# Patient Record
Sex: Female | Born: 1935 | Race: White | Hispanic: No | Smoking: Former smoker
Health system: Southern US, Community
[De-identification: ages and names within clinical notes are randomized; demographics above are authoritative.]

## PROBLEM LIST (undated history)

## (undated) DIAGNOSIS — E785 Hyperlipidemia, unspecified: Secondary | ICD-10-CM

## (undated) DIAGNOSIS — I10 Essential (primary) hypertension: Secondary | ICD-10-CM

## (undated) DIAGNOSIS — F418 Other specified anxiety disorders: Secondary | ICD-10-CM

## (undated) DIAGNOSIS — K589 Irritable bowel syndrome without diarrhea: Secondary | ICD-10-CM

## (undated) DIAGNOSIS — I4891 Unspecified atrial fibrillation: Secondary | ICD-10-CM

## (undated) DIAGNOSIS — I4892 Unspecified atrial flutter: Secondary | ICD-10-CM

## (undated) DIAGNOSIS — K5790 Diverticulosis of intestine, part unspecified, without perforation or abscess without bleeding: Secondary | ICD-10-CM

## (undated) DIAGNOSIS — R413 Other amnesia: Secondary | ICD-10-CM

## (undated) DIAGNOSIS — M549 Dorsalgia, unspecified: Secondary | ICD-10-CM

## (undated) DIAGNOSIS — K219 Gastro-esophageal reflux disease without esophagitis: Secondary | ICD-10-CM

## (undated) DIAGNOSIS — M797 Fibromyalgia: Secondary | ICD-10-CM

## (undated) DIAGNOSIS — M21371 Foot drop, right foot: Secondary | ICD-10-CM

## (undated) DIAGNOSIS — B351 Tinea unguium: Secondary | ICD-10-CM

## (undated) DIAGNOSIS — K633 Ulcer of intestine: Secondary | ICD-10-CM

## (undated) DIAGNOSIS — K649 Unspecified hemorrhoids: Secondary | ICD-10-CM

## (undated) DIAGNOSIS — G8929 Other chronic pain: Secondary | ICD-10-CM

## (undated) DIAGNOSIS — R0989 Other specified symptoms and signs involving the circulatory and respiratory systems: Secondary | ICD-10-CM

## (undated) HISTORY — PX: CERVICAL FUSION: SHX112

## (undated) HISTORY — DX: Gastro-esophageal reflux disease without esophagitis: K21.9

## (undated) HISTORY — DX: Other amnesia: R41.3

## (undated) HISTORY — DX: Other specified anxiety disorders: F41.8

## (undated) HISTORY — DX: Ulcer of intestine: K63.3

## (undated) HISTORY — PX: COLONOSCOPY: SHX174

## (undated) HISTORY — DX: Fibromyalgia: M79.7

## (undated) HISTORY — DX: Tinea unguium: B35.1

## (undated) HISTORY — DX: Hyperlipidemia, unspecified: E78.5

## (undated) HISTORY — DX: Other specified symptoms and signs involving the circulatory and respiratory systems: R09.89

---
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
## 1958-09-22 HISTORY — PX: LUMBAR LAMINECTOMY: SHX95

## 1968-09-22 HISTORY — PX: OTHER SURGICAL HISTORY: SHX169

## 1979-09-23 HISTORY — PX: ABDOMINAL HYSTERECTOMY: SHX81

## 1989-05-23 HISTORY — PX: WRIST SURGERY: SHX841

## 1995-09-23 HISTORY — PX: BACK SURGERY: SHX140

## 1999-04-23 ENCOUNTER — Ambulatory Visit (HOSPITAL_COMMUNITY): Admission: RE | Admit: 1999-04-23 | Discharge: 1999-04-23 | Payer: Self-pay

## 1999-05-21 ENCOUNTER — Other Ambulatory Visit: Admission: RE | Admit: 1999-05-21 | Discharge: 1999-05-21 | Payer: Self-pay | Admitting: Family Medicine

## 2001-01-14 ENCOUNTER — Other Ambulatory Visit: Admission: RE | Admit: 2001-01-14 | Discharge: 2001-01-14 | Payer: Self-pay | Admitting: Family Medicine

## 2002-05-14 ENCOUNTER — Encounter: Payer: Self-pay | Admitting: Neurological Surgery

## 2002-05-14 ENCOUNTER — Ambulatory Visit (HOSPITAL_COMMUNITY): Admission: AD | Admit: 2002-05-14 | Discharge: 2002-05-15 | Payer: Self-pay | Admitting: Neurological Surgery

## 2002-10-11 ENCOUNTER — Ambulatory Visit (HOSPITAL_COMMUNITY): Admission: RE | Admit: 2002-10-11 | Discharge: 2002-10-11 | Payer: Self-pay | Admitting: Neurological Surgery

## 2002-10-11 ENCOUNTER — Encounter: Payer: Self-pay | Admitting: Neurological Surgery

## 2002-10-18 ENCOUNTER — Ambulatory Visit (HOSPITAL_COMMUNITY): Admission: RE | Admit: 2002-10-18 | Discharge: 2002-10-19 | Payer: Self-pay | Admitting: Neurological Surgery

## 2002-10-21 ENCOUNTER — Ambulatory Visit (HOSPITAL_COMMUNITY): Admission: RE | Admit: 2002-10-21 | Discharge: 2002-10-21 | Payer: Self-pay | Admitting: Neurological Surgery

## 2002-10-21 ENCOUNTER — Encounter: Payer: Self-pay | Admitting: Neurological Surgery

## 2002-11-02 ENCOUNTER — Encounter: Payer: Self-pay | Admitting: Neurological Surgery

## 2002-11-02 ENCOUNTER — Ambulatory Visit (HOSPITAL_COMMUNITY): Admission: RE | Admit: 2002-11-02 | Discharge: 2002-11-02 | Payer: Self-pay | Admitting: Neurological Surgery

## 2003-03-29 ENCOUNTER — Encounter: Payer: Self-pay | Admitting: Family Medicine

## 2003-03-29 ENCOUNTER — Encounter: Admission: RE | Admit: 2003-03-29 | Discharge: 2003-03-29 | Payer: Self-pay | Admitting: Family Medicine

## 2003-07-10 ENCOUNTER — Ambulatory Visit (HOSPITAL_COMMUNITY): Admission: RE | Admit: 2003-07-10 | Discharge: 2003-07-10 | Payer: Self-pay | Admitting: Neurological Surgery

## 2003-07-10 ENCOUNTER — Encounter: Payer: Self-pay | Admitting: Neurological Surgery

## 2003-11-16 ENCOUNTER — Ambulatory Visit (HOSPITAL_COMMUNITY): Admission: RE | Admit: 2003-11-16 | Discharge: 2003-11-16 | Payer: Self-pay | Admitting: Gastroenterology

## 2003-12-12 ENCOUNTER — Ambulatory Visit (HOSPITAL_COMMUNITY): Admission: RE | Admit: 2003-12-12 | Discharge: 2003-12-12 | Payer: Self-pay | Admitting: *Deleted

## 2004-01-15 ENCOUNTER — Inpatient Hospital Stay (HOSPITAL_COMMUNITY): Admission: RE | Admit: 2004-01-15 | Discharge: 2004-01-16 | Payer: Self-pay | Admitting: Neurological Surgery

## 2004-02-08 ENCOUNTER — Ambulatory Visit (HOSPITAL_COMMUNITY): Admission: RE | Admit: 2004-02-08 | Discharge: 2004-02-08 | Payer: Self-pay | Admitting: Neurological Surgery

## 2004-07-03 ENCOUNTER — Ambulatory Visit (HOSPITAL_COMMUNITY): Admission: RE | Admit: 2004-07-03 | Discharge: 2004-07-03 | Payer: Self-pay | Admitting: *Deleted

## 2004-07-03 ENCOUNTER — Ambulatory Visit (HOSPITAL_BASED_OUTPATIENT_CLINIC_OR_DEPARTMENT_OTHER): Admission: RE | Admit: 2004-07-03 | Discharge: 2004-07-03 | Payer: Self-pay | Admitting: *Deleted

## 2004-07-03 ENCOUNTER — Encounter (INDEPENDENT_AMBULATORY_CARE_PROVIDER_SITE_OTHER): Payer: Self-pay | Admitting: Specialist

## 2005-05-14 ENCOUNTER — Ambulatory Visit: Payer: Self-pay | Admitting: Family Medicine

## 2005-05-14 ENCOUNTER — Other Ambulatory Visit: Admission: RE | Admit: 2005-05-14 | Discharge: 2005-05-14 | Payer: Self-pay | Admitting: Family Medicine

## 2005-07-08 ENCOUNTER — Ambulatory Visit: Payer: Self-pay | Admitting: Family Medicine

## 2005-09-30 ENCOUNTER — Ambulatory Visit: Payer: Self-pay | Admitting: Family Medicine

## 2006-06-30 ENCOUNTER — Ambulatory Visit: Payer: Self-pay | Admitting: Family Medicine

## 2006-06-30 LAB — CONVERTED CEMR LAB
ALT: 23 units/L (ref 0–40)
AST: 24 units/L (ref 0–37)
BUN: 16 mg/dL (ref 6–23)
Basophils Absolute: 0 10*3/uL (ref 0.0–0.1)
Basophils Relative: 0.6 % (ref 0.0–1.0)
CO2: 28 meq/L (ref 19–32)
Calcium: 9.1 mg/dL (ref 8.4–10.5)
Chloride: 104 meq/L (ref 96–112)
Chol/HDL Ratio, serum: 3.1
Cholesterol: 160 mg/dL (ref 0–200)
Creatinine, Ser: 0.7 mg/dL (ref 0.4–1.2)
Eosinophil percent: 0.9 % (ref 0.0–5.0)
GFR calc non Af Amer: 88 mL/min
Glomerular Filtration Rate, Af Am: 107 mL/min/{1.73_m2}
Glucose, Bld: 98 mg/dL (ref 70–99)
HCT: 39.4 % (ref 36.0–46.0)
HDL: 52 mg/dL (ref >39.0)
Hemoglobin: 13.3 g/dL (ref 12.0–15.0)
LDL Cholesterol: 81 mg/dL (ref 0–99)
Lymphocytes Relative: 30.5 % (ref 12.0–46.0)
MCHC: 33.7 g/dL (ref 30.0–36.0)
MCV: 93.6 fL (ref 78.0–100.0)
Monocytes Absolute: 0.6 10*3/uL (ref 0.2–0.7)
Monocytes Relative: 8 % (ref 3.0–11.0)
Neutro Abs: 4.4 10*3/uL (ref 1.4–7.7)
Neutrophils Relative %: 60 % (ref 43.0–77.0)
Platelets: 237 10*3/uL (ref 150–400)
Potassium: 3.9 meq/L (ref 3.5–5.1)
RBC: 4.21 M/uL (ref 3.87–5.11)
RDW: 12.5 % (ref 11.5–14.6)
Sodium: 139 meq/L (ref 135–145)
TSH: 2.49 microintl units/mL (ref 0.35–5.50)
Triglyceride fasting, serum: 133 mg/dL (ref 0–149)
VLDL: 27 mg/dL (ref 0–40)
WBC: 7.4 10*3/uL (ref 4.5–10.5)

## 2006-07-09 ENCOUNTER — Ambulatory Visit: Payer: Self-pay | Admitting: Family Medicine

## 2006-09-22 LAB — HM COLONOSCOPY

## 2006-09-28 ENCOUNTER — Ambulatory Visit: Payer: Self-pay | Admitting: Family Medicine

## 2006-10-16 ENCOUNTER — Encounter: Admission: RE | Admit: 2006-10-16 | Discharge: 2006-10-16 | Payer: Self-pay | Admitting: Family Medicine

## 2006-11-05 ENCOUNTER — Ambulatory Visit: Payer: Self-pay | Admitting: Gastroenterology

## 2006-12-01 ENCOUNTER — Ambulatory Visit: Payer: Self-pay | Admitting: Gastroenterology

## 2006-12-01 ENCOUNTER — Encounter: Payer: Self-pay | Admitting: Family Medicine

## 2007-01-28 ENCOUNTER — Ambulatory Visit: Payer: Self-pay | Admitting: Family Medicine

## 2007-02-09 ENCOUNTER — Encounter: Payer: Self-pay | Admitting: Family Medicine

## 2007-05-11 ENCOUNTER — Telehealth: Payer: Self-pay | Admitting: Family Medicine

## 2007-06-01 ENCOUNTER — Telehealth: Payer: Self-pay | Admitting: Family Medicine

## 2007-06-23 ENCOUNTER — Telehealth (INDEPENDENT_AMBULATORY_CARE_PROVIDER_SITE_OTHER): Payer: Self-pay | Admitting: *Deleted

## 2007-07-02 ENCOUNTER — Telehealth: Payer: Self-pay | Admitting: Family Medicine

## 2007-07-13 ENCOUNTER — Telehealth: Payer: Self-pay | Admitting: Family Medicine

## 2007-07-22 ENCOUNTER — Ambulatory Visit: Payer: Self-pay | Admitting: Family Medicine

## 2007-07-22 ENCOUNTER — Other Ambulatory Visit: Admission: RE | Admit: 2007-07-22 | Discharge: 2007-07-22 | Payer: Self-pay | Admitting: Family Medicine

## 2007-07-22 ENCOUNTER — Encounter: Payer: Self-pay | Admitting: Family Medicine

## 2007-07-22 DIAGNOSIS — D649 Anemia, unspecified: Secondary | ICD-10-CM | POA: Insufficient documentation

## 2007-07-22 DIAGNOSIS — E039 Hypothyroidism, unspecified: Secondary | ICD-10-CM | POA: Insufficient documentation

## 2007-07-22 DIAGNOSIS — L259 Unspecified contact dermatitis, unspecified cause: Secondary | ICD-10-CM | POA: Insufficient documentation

## 2007-07-22 DIAGNOSIS — N390 Urinary tract infection, site not specified: Secondary | ICD-10-CM | POA: Insufficient documentation

## 2007-07-22 LAB — CONVERTED CEMR LAB
Bilirubin Urine: NEGATIVE
Glucose, Urine, Semiquant: NEGATIVE
Ketones, urine, test strip: NEGATIVE
Nitrite: NEGATIVE
Specific Gravity, Urine: 1.015
Urobilinogen, UA: 0.2
WBC Urine, dipstick: NEGATIVE
pH: 7.5

## 2007-07-28 ENCOUNTER — Encounter: Payer: Self-pay | Admitting: Family Medicine

## 2007-07-28 LAB — CONVERTED CEMR LAB: Pap Smear: NEGATIVE

## 2007-07-29 LAB — CONVERTED CEMR LAB
ALT: 22 units/L (ref 0–35)
AST: 27 units/L (ref 0–37)
Albumin: 4 g/dL (ref 3.5–5.2)
Alkaline Phosphatase: 81 units/L (ref 39–117)
BUN: 18 mg/dL (ref 6–23)
Basophils Absolute: 0 10*3/uL (ref 0.0–0.1)
Basophils Relative: 0.5 % (ref 0.0–1.0)
Bilirubin, Direct: 0.1 mg/dL (ref 0.0–0.3)
CO2: 26 meq/L (ref 19–32)
Calcium: 9.3 mg/dL (ref 8.4–10.5)
Chloride: 105 meq/L (ref 96–112)
Cholesterol: 204 mg/dL (ref 0–200)
Creatinine, Ser: 0.6 mg/dL (ref 0.4–1.2)
Direct LDL: 127.9 mg/dL
Eosinophils Absolute: 0.1 10*3/uL (ref 0.0–0.6)
Eosinophils Relative: 0.9 % (ref 0.0–5.0)
GFR calc Af Amer: 127 mL/min
GFR calc non Af Amer: 105 mL/min
Glucose, Bld: 87 mg/dL (ref 70–99)
HCT: 37.8 % (ref 36.0–46.0)
HDL: 50.6 mg/dL (ref >39.0)
Hemoglobin: 13.2 g/dL (ref 12.0–15.0)
Lymphocytes Relative: 33.9 % (ref 12.0–46.0)
MCHC: 34.9 g/dL (ref 30.0–36.0)
MCV: 92.7 fL (ref 78.0–100.0)
Monocytes Absolute: 0.6 10*3/uL (ref 0.2–0.7)
Monocytes Relative: 7.7 % (ref 3.0–11.0)
Neutro Abs: 4.7 10*3/uL (ref 1.4–7.7)
Neutrophils Relative %: 57 % (ref 43.0–77.0)
Platelets: 201 10*3/uL (ref 150–400)
Potassium: 4.3 meq/L (ref 3.5–5.1)
RBC: 4.07 M/uL (ref 3.87–5.11)
RDW: 12.1 % (ref 11.5–14.6)
Sodium: 141 meq/L (ref 135–145)
TSH: 2.5 microintl units/mL (ref 0.35–5.50)
Total Bilirubin: 0.6 mg/dL (ref 0.3–1.2)
Total CHOL/HDL Ratio: 4
Total Protein: 6.7 g/dL (ref 6.0–8.3)
Triglycerides: 186 mg/dL — ABNORMAL HIGH (ref 0–149)
VLDL: 37 mg/dL (ref 0–40)
WBC: 8.1 10*3/uL (ref 4.5–10.5)

## 2007-09-20 ENCOUNTER — Ambulatory Visit: Payer: Self-pay | Admitting: Family Medicine

## 2007-09-21 ENCOUNTER — Telehealth: Payer: Self-pay | Admitting: Family Medicine

## 2008-02-25 ENCOUNTER — Encounter: Payer: Self-pay | Admitting: Family Medicine

## 2008-08-04 ENCOUNTER — Ambulatory Visit: Payer: Self-pay | Admitting: Family Medicine

## 2008-08-04 DIAGNOSIS — M949 Disorder of cartilage, unspecified: Secondary | ICD-10-CM

## 2008-08-04 DIAGNOSIS — M797 Fibromyalgia: Secondary | ICD-10-CM | POA: Insufficient documentation

## 2008-08-04 DIAGNOSIS — M899 Disorder of bone, unspecified: Secondary | ICD-10-CM | POA: Insufficient documentation

## 2008-08-04 DIAGNOSIS — K219 Gastro-esophageal reflux disease without esophagitis: Secondary | ICD-10-CM | POA: Insufficient documentation

## 2008-08-04 LAB — CONVERTED CEMR LAB
Bilirubin Urine: NEGATIVE
Glucose, Urine, Semiquant: NEGATIVE
Ketones, urine, test strip: NEGATIVE
Nitrite: NEGATIVE
Protein, U semiquant: NEGATIVE
Specific Gravity, Urine: 1.015
Urobilinogen, UA: NEGATIVE
WBC Urine, dipstick: NEGATIVE
pH: 7.5

## 2008-08-10 LAB — CONVERTED CEMR LAB
ALT: 21 units/L (ref 0–35)
AST: 27 units/L (ref 0–37)
Albumin: 3.7 g/dL (ref 3.5–5.2)
Alkaline Phosphatase: 75 units/L (ref 39–117)
BUN: 18 mg/dL (ref 6–23)
Basophils Absolute: 0 10*3/uL (ref 0.0–0.1)
Basophils Relative: 0.6 % (ref 0.0–3.0)
Bilirubin, Direct: 0.1 mg/dL (ref 0.0–0.3)
CO2: 29 meq/L (ref 19–32)
Calcium: 9 mg/dL (ref 8.4–10.5)
Chloride: 107 meq/L (ref 96–112)
Cholesterol: 179 mg/dL (ref 0–200)
Creatinine, Ser: 0.7 mg/dL (ref 0.4–1.2)
Direct LDL: 93.3 mg/dL
Eosinophils Absolute: 0.1 10*3/uL (ref 0.0–0.7)
Eosinophils Relative: 0.9 % (ref 0.0–5.0)
GFR calc Af Amer: 106 mL/min
GFR calc non Af Amer: 88 mL/min
Glucose, Bld: 92 mg/dL (ref 70–99)
HCT: 37.2 % (ref 36.0–46.0)
HDL: 48.9 mg/dL (ref >39.0)
Hemoglobin: 13.1 g/dL (ref 12.0–15.0)
Lymphocytes Relative: 28.9 % (ref 12.0–46.0)
MCHC: 35.2 g/dL (ref 30.0–36.0)
MCV: 90.2 fL (ref 78.0–100.0)
Monocytes Absolute: 0.6 10*3/uL (ref 0.1–1.0)
Monocytes Relative: 8.3 % (ref 3.0–12.0)
Neutro Abs: 4.6 10*3/uL (ref 1.4–7.7)
Neutrophils Relative %: 61.3 % (ref 43.0–77.0)
Platelets: 206 10*3/uL (ref 150–400)
Potassium: 4.7 meq/L (ref 3.5–5.1)
RBC: 4.13 M/uL (ref 3.87–5.11)
RDW: 11.6 % (ref 11.5–14.6)
Sodium: 140 meq/L (ref 135–145)
TSH: 3.76 microintl units/mL (ref 0.35–5.50)
Total Bilirubin: 0.5 mg/dL (ref 0.3–1.2)
Total CHOL/HDL Ratio: 3.7
Total Protein: 6.7 g/dL (ref 6.0–8.3)
Triglycerides: 203 mg/dL (ref 0–149)
VLDL: 41 mg/dL — ABNORMAL HIGH (ref 0–40)
Vit D, 1,25-Dihydroxy: 33 (ref 30–89)
WBC: 7.5 10*3/uL (ref 4.5–10.5)

## 2008-11-23 ENCOUNTER — Ambulatory Visit: Payer: Self-pay | Admitting: Family Medicine

## 2008-11-23 LAB — CONVERTED CEMR LAB
OCCULT 1: NEGATIVE
OCCULT 2: NEGATIVE
OCCULT 3: NEGATIVE

## 2008-11-27 ENCOUNTER — Encounter: Payer: Self-pay | Admitting: Family Medicine

## 2009-02-28 ENCOUNTER — Encounter: Payer: Self-pay | Admitting: Family Medicine

## 2009-03-06 ENCOUNTER — Ambulatory Visit: Payer: Self-pay | Admitting: Family Medicine

## 2009-03-06 DIAGNOSIS — K589 Irritable bowel syndrome without diarrhea: Secondary | ICD-10-CM | POA: Insufficient documentation

## 2009-03-06 DIAGNOSIS — R197 Diarrhea, unspecified: Secondary | ICD-10-CM | POA: Insufficient documentation

## 2009-03-06 DIAGNOSIS — M5412 Radiculopathy, cervical region: Secondary | ICD-10-CM | POA: Insufficient documentation

## 2009-03-07 ENCOUNTER — Telehealth: Payer: Self-pay | Admitting: Family Medicine

## 2009-08-07 ENCOUNTER — Telehealth: Payer: Self-pay | Admitting: Family Medicine

## 2009-08-09 ENCOUNTER — Ambulatory Visit: Payer: Self-pay | Admitting: Family Medicine

## 2009-08-09 LAB — CONVERTED CEMR LAB
Cholesterol, target level: 200 mg/dL
HDL goal, serum: 40 mg/dL
LDL Goal: 160 mg/dL

## 2009-09-20 ENCOUNTER — Ambulatory Visit: Payer: Self-pay | Admitting: Family Medicine

## 2009-09-20 ENCOUNTER — Other Ambulatory Visit: Admission: RE | Admit: 2009-09-20 | Discharge: 2009-09-20 | Payer: Self-pay | Admitting: Family Medicine

## 2009-09-20 DIAGNOSIS — M67919 Unspecified disorder of synovium and tendon, unspecified shoulder: Secondary | ICD-10-CM | POA: Insufficient documentation

## 2009-09-20 DIAGNOSIS — M719 Bursopathy, unspecified: Secondary | ICD-10-CM

## 2009-09-20 DIAGNOSIS — Z87891 Personal history of nicotine dependence: Secondary | ICD-10-CM | POA: Insufficient documentation

## 2009-09-20 LAB — CONVERTED CEMR LAB
Bilirubin Urine: NEGATIVE
Blood in Urine, dipstick: NEGATIVE
Glucose, Urine, Semiquant: NEGATIVE
Ketones, urine, test strip: NEGATIVE
Nitrite: NEGATIVE
Specific Gravity, Urine: 1.015
Urobilinogen, UA: 0.2
WBC Urine, dipstick: NEGATIVE
pH: 6.5

## 2009-09-20 LAB — HM PAP SMEAR

## 2009-09-28 LAB — CONVERTED CEMR LAB
ALT: 24 units/L (ref 0–35)
AST: 29 units/L (ref 0–37)
Albumin: 3.6 g/dL (ref 3.5–5.2)
Alkaline Phosphatase: 73 units/L (ref 39–117)
BUN: 23 mg/dL (ref 6–23)
Basophils Absolute: 0 10*3/uL (ref 0.0–0.1)
Basophils Relative: 0.6 % (ref 0.0–3.0)
Bilirubin, Direct: 0 mg/dL (ref 0.0–0.3)
CO2: 25 meq/L (ref 19–32)
Calcium: 9 mg/dL (ref 8.4–10.5)
Chloride: 108 meq/L (ref 96–112)
Cholesterol: 184 mg/dL (ref 0–200)
Creatinine, Ser: 0.6 mg/dL (ref 0.4–1.2)
Eosinophils Absolute: 0.1 10*3/uL (ref 0.0–0.7)
Eosinophils Relative: 1.4 % (ref 0.0–5.0)
GFR calc non Af Amer: 104.15 mL/min (ref >60)
Glucose, Bld: 101 mg/dL — ABNORMAL HIGH (ref 70–99)
HCT: 38.9 % (ref 36.0–46.0)
HDL: 50.9 mg/dL (ref >39.00)
Hemoglobin: 13.1 g/dL (ref 12.0–15.0)
LDL Cholesterol: 97 mg/dL (ref 0–99)
Lymphocytes Relative: 29.6 % (ref 12.0–46.0)
Lymphs Abs: 2 10*3/uL (ref 0.7–4.0)
MCHC: 33.6 g/dL (ref 30.0–36.0)
MCV: 94.5 fL (ref 78.0–100.0)
Monocytes Absolute: 0.5 10*3/uL (ref 0.1–1.0)
Monocytes Relative: 8.1 % (ref 3.0–12.0)
Neutro Abs: 4.1 10*3/uL (ref 1.4–7.7)
Neutrophils Relative %: 60.3 % (ref 43.0–77.0)
Platelets: 201 10*3/uL (ref 150.0–400.0)
Potassium: 4.1 meq/L (ref 3.5–5.1)
RBC: 4.12 M/uL (ref 3.87–5.11)
RDW: 12.4 % (ref 11.5–14.6)
Sodium: 139 meq/L (ref 135–145)
TSH: 2.53 microintl units/mL (ref 0.35–5.50)
Total Bilirubin: 0.6 mg/dL (ref 0.3–1.2)
Total CHOL/HDL Ratio: 4
Total Protein: 6.6 g/dL (ref 6.0–8.3)
Triglycerides: 183 mg/dL — ABNORMAL HIGH (ref 0.0–149.0)
VLDL: 36.6 mg/dL (ref 0.0–40.0)
Vit D, 25-Hydroxy: 48 ng/mL (ref 30–89)
WBC: 6.7 10*3/uL (ref 4.5–10.5)

## 2009-11-06 ENCOUNTER — Telehealth: Payer: Self-pay | Admitting: Family Medicine

## 2010-01-08 ENCOUNTER — Ambulatory Visit: Payer: Self-pay | Admitting: Family Medicine

## 2010-01-14 LAB — CONVERTED CEMR LAB
OCCULT 1: NEGATIVE
OCCULT 2: NEGATIVE
OCCULT 3: NEGATIVE

## 2010-02-19 ENCOUNTER — Ambulatory Visit: Payer: Self-pay | Admitting: Family Medicine

## 2010-02-19 DIAGNOSIS — M5137 Other intervertebral disc degeneration, lumbosacral region: Secondary | ICD-10-CM | POA: Insufficient documentation

## 2010-02-19 DIAGNOSIS — S335XXA Sprain of ligaments of lumbar spine, initial encounter: Secondary | ICD-10-CM

## 2010-02-19 DIAGNOSIS — S339XXA Sprain of unspecified parts of lumbar spine and pelvis, initial encounter: Secondary | ICD-10-CM | POA: Insufficient documentation

## 2010-02-19 DIAGNOSIS — IMO0002 Reserved for concepts with insufficient information to code with codable children: Secondary | ICD-10-CM | POA: Insufficient documentation

## 2010-02-19 DIAGNOSIS — S93409A Sprain of unspecified ligament of unspecified ankle, initial encounter: Secondary | ICD-10-CM | POA: Insufficient documentation

## 2010-02-20 ENCOUNTER — Telehealth: Payer: Self-pay | Admitting: Family Medicine

## 2010-05-02 ENCOUNTER — Ambulatory Visit: Payer: Self-pay | Admitting: Cardiology

## 2010-05-20 ENCOUNTER — Ambulatory Visit: Payer: Self-pay | Admitting: Family Medicine

## 2010-05-20 DIAGNOSIS — M25561 Pain in right knee: Secondary | ICD-10-CM | POA: Insufficient documentation

## 2010-05-20 DIAGNOSIS — M25562 Pain in left knee: Secondary | ICD-10-CM

## 2010-05-28 ENCOUNTER — Telehealth (INDEPENDENT_AMBULATORY_CARE_PROVIDER_SITE_OTHER): Payer: Self-pay | Admitting: *Deleted

## 2010-06-20 ENCOUNTER — Encounter: Payer: Self-pay | Admitting: Family Medicine

## 2010-06-24 ENCOUNTER — Encounter: Payer: Self-pay | Admitting: Family Medicine

## 2010-06-25 ENCOUNTER — Encounter: Payer: Self-pay | Admitting: Family Medicine

## 2010-07-03 ENCOUNTER — Encounter: Payer: Self-pay | Admitting: Family Medicine

## 2010-07-19 ENCOUNTER — Encounter: Payer: Self-pay | Admitting: Family Medicine

## 2010-09-22 HISTORY — PX: CARPAL TUNNEL RELEASE: SHX101

## 2010-09-22 HISTORY — PX: TOTAL HIP ARTHROPLASTY: SHX124

## 2010-09-24 ENCOUNTER — Telehealth: Payer: Self-pay | Admitting: Family Medicine

## 2010-10-01 ENCOUNTER — Telehealth: Payer: Self-pay | Admitting: *Deleted

## 2010-10-12 ENCOUNTER — Encounter: Payer: Self-pay | Admitting: Gastroenterology

## 2010-10-14 ENCOUNTER — Telehealth: Payer: Self-pay | Admitting: Family Medicine

## 2010-10-21 ENCOUNTER — Telehealth: Payer: Self-pay | Admitting: Family Medicine

## 2010-10-22 NOTE — Letter (Signed)
Summary: Generic Letter  [place] [facility]  [address] [place], [place] [address]   Phone: [phone]  Fax: [phone]    07/28/2007  DOROTHA HIRSCHI [address] [place], [place]  [address]  Dear Ms. Stricker, PAP SMEAR NEGATIVE. HAVE A GOOD DAY          Sincerely,   gina , Editor, commissioning at [employer]

## 2010-10-22 NOTE — Assessment & Plan Note (Signed)
Summary: EMP/WILL FAST/FLU SHOT/CCM   Vital Signs:  Patient Profile:   77 Years Old Female Weight:      158 pounds Temp:     98.8 degrees F Pulse rate:   64 / minute BP sitting:   120 / 70  (left arm) Cuff size:   regular  Vitals Entered By: Pura Spice, RN (July 22, 2007 11:09 AM)                 Chief Complaint:  yrly with refills and labs.  History of Present Illness: In for yearly evaluation and refills complaining of rt shoulder pain on movement. also hx of sinusitis tr at urgent care with levaquin, problem persist and deciced to repeat tx for two weeks with Levaquin 500 mg once daily pe revealed ac and subdeltoid bursitis to tx depomedrol needs mammogram and bone density to refill rxs  Current Allergies: ! SULFA ! MACRODANTIN (NITROFURANTOIN MACROCRYSTAL) ! DEMEROL ! CEFTIN ! * HISMANAL ! * MAPROBAMATE ! CODEINE     Review of Systems  General      Denies chills, fatigue, fever, loss of appetite, malaise, sleep disorder, sweats, weakness, and weight loss.  Eyes      Denies blurring, discharge, double vision, eye irritation, eye pain, halos, itching, light sensitivity, red eye, vision loss-1 eye, and vision loss-both eyes.  ENT      Complains of nasal congestion, postnasal drainage, and sinus pressure.  CV      Denies bluish discoloration of lips or nails, chest pain or discomfort, difficulty breathing at night, difficulty breathing while lying down, fainting, fatigue, leg cramps with exertion, lightheadness, near fainting, palpitations, shortness of breath with exertion, swelling of feet, swelling of hands, and weight gain.  Resp      Denies chest discomfort, chest pain with inspiration, cough, coughing up blood, excessive snoring, hypersomnolence, morning headaches, pleuritic, shortness of breath, sputum productive, and wheezing.  GI      Denies abdominal pain, bloody stools, change in bowel habits, constipation, dark tarry stools, diarrhea,  excessive appetite, gas, hemorrhoids, indigestion, loss of appetite, nausea, vomiting, vomiting blood, and yellowish skin color.      cotrolled with Nexium  MS      Complains of joint pain, low back pain, muscle aches, and stiffness.      Dr. Phylliss Bob treats these problems   Physical Exam  General:     Well-developed,well-nourished,in no acute distress; alert,appropriate and cooperative throughout examination Head:     Normocephalic and atraumatic without obvious abnormalities. No apparent alopecia or balding. Eyes:     No corneal or conjunctival inflammation noted. EOMI. Perrla. Funduscopic exam benign, without hemorrhages, exudates or papilledema. Vision grossly normal. Ears:     External ear exam shows no significant lesions or deformities.  Otoscopic examination reveals clear canals, tympanic membranes are intact bilaterally without bulging, retraction, inflammation or discharge. Hearing is grossly normal bilaterally. Nose:     nasal dischargemucosal pallor.  post nasal drainage Mouth:     Oral mucosa and oropharynx without lesions or exudates.  Teeth in good repair. Neck:     No deformities, masses, or tenderness noted. Chest Wall:     No deformities, masses, or tenderness noted. Breasts:     No mass, nodules, thickening, tenderness, bulging, retraction, inflamation, nipple discharge or skin changes noted.   Lungs:     Normal respiratory effort, chest expands symmetrically. Lungs are clear to auscultation, no crackles or wheezes. Heart:     Normal rate  and regular rhythm. S1 and S2 normal without gallop, murmur, click, rub or other extra sounds. Abdomen:     Bowel sounds positive,abdomen soft and non-tender without masses, organomegaly or hernias noted. Rectal:     No external abnormalities noted. Normal sphincter tone. No rectal masses or tenderness. Genitalia:     Normal introitus for age, no external lesions, no vaginal discharge, mucosa pink and moist, no vaginal or cervical  lesions, no vaginal atrophy, no friaility or hemorrhage, normal uterus size and position, no adnexal masses or tenderness Msk:     deltoid bursa  and ac bursa tender rt shoulder Pulses:     R and L carotid,radial,femoral,dorsalis pedis and posterior tibial pulses are full and equal bilaterally Extremities:     No clubbing, cyanosis, edema, or deformity noted with normal full range of motion of all joints.   Neurologic:     No cranial nerve deficits noted. Station and gait are normal. Plantar reflexes are down-going bilaterally. DTRs are symmetrical throughout. Sensory, motor and coordinative functions appear intact. Skin:     chronic dermatitis non responsive to all rx,unable to take more than 1 Lyrica to see dermatologist Cervical Nodes:     No lymphadenopathy noted Axillary Nodes:     No palpable lymphadenopathy Inguinal Nodes:     No significant adenopathy Psych:     Cognition and judgment appear intact. Alert and cooperative with normal attention span and concentration. No apparent delusions, illusions, hallucinations    Impression & Recommendations:  lot [id number], EXP 30 jun 09, sanofi pasteur left deltoid IM, 0.5 cc. per dr wr stafford ..................................................................Marland KitchenPura Spice, RN  July 22, 2007 3:36 P   Complete Medication List: 1)  Premarin 0.625 Mg Tabs (Estrogens conjugated) .... Once daily 2)  Slow-mag 535 (64 Mg) Mg Tbcr (Magnesium chloride) .... Take 1 tablet by mouth twice a day 3)  Lexapro 10 Mg Tabs (Escitalopram oxalate) .... Take 1/2 tablet evvery am 4)  Torsemide 10 Mg Tabs (Torsemide) .... Once daily 5)  Nexium 40 Mg Cpdr (Esomeprazole magnesium) .... Once daily 6)  Nortriptyline Hcl 25 Mg Caps (Nortriptyline hcl) 7)  Bisoprolol Fumarate 5 Mg Tabs (Bisoprolol fumarate) 8)  Lipitor 20 Mg Tabs (Atorvastatin calcium) 9)  Zetia 10 Mg Tabs (Ezetimibe) .... Once daily 10)  Lunesta 3 Mg Tabs (Eszopiclone) .... Dr Phylliss Bob 11)   Boniva 150 Mg Tabs (Ibandronate sodium) .... Dr Phylliss Bob 12)  Lyrica 50 Mg Caps (Pregabalin) .... Take 1 every pm     Prescriptions: LYRICA 50 MG  CAPS (PREGABALIN) take 1 every pm  #30 x 6   Entered by:   Pura Spice, RN   Authorized by:   Judithann Sheen MD   Signed by:   Pura Spice, RN on 07/22/2007   Method used:   Print then Give to Patient   RxID:   [id number] ZETIA 10 MG  TABS (EZETIMIBE) once daily  #30 x 12   Entered by:   Pura Spice, RN   Authorized by:   Judithann Sheen MD   Signed by:   Pura Spice, RN on 07/22/2007   Method used:   Print then Give to Patient   RxID:   [id number] NEXIUM 40 MG  CPDR (ESOMEPRAZOLE MAGNESIUM) once daily  #30 x 12   Entered by:   Pura Spice, RN   Authorized by:   Judithann Sheen MD   Signed by:   Pura Spice, RN  on 07/22/2007   Method used:   Print then Give to Patient   RxID:   [id number] TORSEMIDE 10 MG  TABS (TORSEMIDE) once daily  #30 x 12   Entered by:   Pura Spice, RN   Authorized by:   Judithann Sheen MD   Signed by:   Pura Spice, RN on 07/22/2007   Method used:   Print then Give to Patient   RxID:   [id number] LEXAPRO 10 MG  TABS (ESCITALOPRAM OXALATE) take 1/2 tablet evvery am  #30 x 7   Entered by:   Pura Spice, RN   Authorized by:   Judithann Sheen MD   Signed by:   Pura Spice, RN on 07/22/2007   Method used:   Print then Give to Patient   RxID:   [id number] PREMARIN 0.625 MG  TABS (ESTROGENS CONJUGATED) once daily  #30 x 12   Entered by:   Pura Spice, RN   Authorized by:   Judithann Sheen MD   Signed by:   Pura Spice, RN on 07/22/2007   Method used:   Print then Give to Patient   RxID:   [id number]  ]  Influenza Vaccine    Vaccine Type: Fluvax MCR    Given by: dr wr stafford  Flu Vaccine Consent Questions    Do you have a history of severe allergic reactions to this vaccine? no    Any prior history of allergic  reactions to egg and/or gelatin? no    Do you have a sensitivity to the preservative Thimersol? no    Do you have a past history of Guillan-Barre Syndrome? no    Do you currently have an acute febrile illness? no    Have you ever had a severe reaction to latex? no    Vaccine information given and explained to patient? yes    Are you currently pregnant? no    Laboratory Results   Urine Tests   Date/Time Reported: July 22, 2007 3:19 PM   Routine Urinalysis   Color: yellow Appearance: Clear Glucose: negative   (Normal Range: Negative) Bilirubin: negative   (Normal Range: Negative) Ketone: negative   (Normal Range: Negative) Spec. Gravity: 1.015   (Normal Range: 1.003-1.035) Blood: trace-intact   (Normal Range: Negative) pH: 7.5   (Normal Range: 5.0-8.0) Protein: 1+   (Normal Range: Negative) Urobilinogen: 0.2   (Normal Range: 0-1) Nitrite: negative   (Normal Range: Negative) Leukocyte Esterace: negative   (Normal Range: Negative)    Comments: ..................................................................Marland KitchenWynona Canes, CMA  July 22, 2007 3:19 PM        Orders Added: 1)  TLB-Lipid Panel [80061-LIPID] 2)  TLB-BMP (Basic Metabolic Panel-BMET) [80048-METABOL] 3)  TLB-Hepatic/Liver Function Pnl [80076-HEPATIC] 4)  TLB-TSH (Thyroid Stimulating Hormone) [84443-TSH] 5)  Venipuncture [36415] 6)  UA Dipstick w/o Micro [81002] 7)  TLB-CBC Platelet - w/Differential [85025-CBCD] 8)  Influenza Vaccine MCR [00025] 9)  Est. Patient Level IV [42595]  Appended Document: EMP/WILL FAST/FLU SHOT/CCM      Current Allergies: ! SULFA ! MACRODANTIN (NITROFURANTOIN MACROCRYSTAL) ! DEMEROL ! CEFTIN ! * HISMANAL ! * MAPROBAMATE ! CODEINE        Complete Medication List: 1)  Premarin 0.625 Mg Tabs (Estrogens conjugated) .... Once daily 2)  Slow-mag 535 (64 Mg) Mg Tbcr (Magnesium chloride) .... Take 1 tablet by mouth twice a day 3)  Lexapro 10 Mg Tabs  (Escitalopram oxalate) .... Take 1/2 tablet evvery  am 4)  Torsemide 10 Mg Tabs (Torsemide) .... Once daily 5)  Nexium 40 Mg Cpdr (Esomeprazole magnesium) .... Once daily 6)  Nortriptyline Hcl 25 Mg Caps (Nortriptyline hcl) 7)  Bisoprolol Fumarate 5 Mg Tabs (Bisoprolol fumarate) 8)  Lipitor 20 Mg Tabs (Atorvastatin calcium) 9)  Zetia 10 Mg Tabs (Ezetimibe) .... Once daily 10)  Lunesta 3 Mg Tabs (Eszopiclone) .... Dr Phylliss Bob 11)  Boniva 150 Mg Tabs (Ibandronate sodium) .... Dr Phylliss Bob 12)  Lyrica 50 Mg Caps (Pregabalin) .... Take 1 every pm     ]  Medication Administration  Injection # 1:    Medication: Depo- Medrol 80mg     Diagnosis: BURSITIS, RIGHT SHOULDER (ICD-726.10)    Route: IM    Site: RUOQ gluteus    Exp Date: 12/09    Lot #: [id number]    Mfr: sircor    Patient tolerated injection without complications    Given by: Judithann Sheen MD (July 27, 2007 8:59 AM)  Injection # 2:    Medication: Depo- Medrol 40mg     Diagnosis: BURSITIS, RIGHT SHOULDER (ICD-726.10)    Route: IM    Site: RUOQ gluteus    Exp Date: 12/09    Lot #: [id number]    Mfr: sircor    Patient tolerated injection without complications    Given by: Judithann Sheen MD (July 27, 2007 9:00 AM)  Orders Added: 1)  Depo- Medrol 80mg  [J1040] 2)  Depo- Medrol 40mg  [J1030] 3)  Admin of Therapeutic Inj  intramuscular or subcutaneous [90772]

## 2010-10-22 NOTE — Progress Notes (Signed)
Summary: pt req refills of simvastatin and zetia   Phone Note Call from Patient Call back at [facility] Phone [phone]   Caller: Patient Summary of Call: Pt called req refill for Simvastatin and Zetia. Please call in to [facility] asap and notify pt when this has been done.  Initial call taken by: Lucy Antigua,  August 07, 2009 1:49 PM  Follow-up for Phone Call        ok refilled x2 and needs to call sch cpx  Follow-up by: Pura Spice, RN,  August 07, 2009 1:56 PM    New/Updated Medications: ZETIA 10 MG  TABS (EZETIMIBE) once daily SIMVASTATIN 40 MG TABS (SIMVASTATIN) 1 by mouth in the evening Prescriptions: SIMVASTATIN 40 MG TABS (SIMVASTATIN) 1 by mouth in the evening  #30 x 2   Entered by:   Pura Spice, RN   Authorized by:   Judithann Sheen MD   Signed by:   Pura Spice, RN on 08/07/2009   Method used:   Electronically to        [facility] (retail)       [address]       [place], [place]  [address]       Ph: [phone]       Fax: [phone]   RxID:   [id number] ZETIA 10 MG  TABS (EZETIMIBE) once daily  #30 x 2   Entered by:   Pura Spice, RN   Authorized by:   Judithann Sheen MD   Signed by:   Pura Spice, RN on 08/07/2009   Method used:   Electronically to        [facility] (retail)       [address]       [place], [place]  [address]       Ph: [phone]       Fax: [phone]   RxID:   [id number]

## 2010-10-22 NOTE — Assessment & Plan Note (Signed)
Summary: st/njr   Vital Signs:  Patient profile:   77 year old female Weight:      156 pounds O2 Sat:      94 % Temp:     98.5 degrees F Pulse rate:   75 / minute Pulse rhythm:   regular BP sitting:   140 / 80  (left arm)  Vitals Entered By: Pura Spice, RN (August 09, 2009 11:38 AM) CC: sore throat cough , Lipid Management   History of Present Illness: this 77 year old white female complains of sore throat and cough over the past 4 or 5 days.Her symptoms have increased in severity over this period of time and over this period of time she developed laryngitis. She has continued to have a cough nonproductive but incessant Fibromyalgia continues to be a problem. Blood pressure is been well controlled irritable bowel syndrome and GERD have been under good control  Lipid Management History:      Positive NCEP/ATP III risk factors include female age 57 years old or older.  Negative NCEP/ATP III risk factors include non-tobacco-user status.    Allergies: 1)  ! Sulfa 2)  ! Macrodantin (Nitrofurantoin Macrocrystal) 3)  ! Demerol 4)  ! Ceftin 5)  ! * Hismanal 6)  ! * Maprobamate 7)  ! Codeine  Past History:  Past Surgical History: Last updated: 04/19/2007 Hysterectomy-1981 Laminectomy-1960 Veins Stripped-1970 Back sx-1997 Anterior Cervicle Vertibrea Reconstruction-2003  Past Medical History: last pap  2006  2008  mamogram  2009  colon           2005  DEXA          2009  EKG   ------DR Jose Persia    FORMER  Fibromyalgia  Review of Systems  The patient denies anorexia, fever, weight loss, weight gain, vision loss, decreased hearing, hoarseness, chest pain, syncope, dyspnea on exertion, peripheral edema, prolonged cough, headaches, hemoptysis, abdominal pain, melena, hematochezia, severe indigestion/heartburn, hematuria, incontinence, genital sores, muscle weakness, suspicious skin lesions, transient blindness, difficulty walking, depression, unusual weight  change, abnormal bleeding, enlarged lymph nodes, angioedema, breast masses, and testicular masses.    Physical Exam  General:  Well-developed,well-nourished,in no acute distress; alert,appropriate and cooperative throughout examination Head:  Normocephalic and atraumatic without obvious abnormalities. No apparent alopecia or balding. Eyes:  No corneal or conjunctival inflammation noted. EOMI. Perrla. Funduscopic exam benign, without hemorrhages, exudates or papilledema. Vision grossly normal. Ears:  External ear exam shows no significant lesions or deformities.  Otoscopic examination reveals clear canals, tympanic membranes are intact bilaterally without bulging, retraction, inflammation or discharge. Hearing is grossly normal bilaterally. Nose:  nasal congestion with yellow drainage Mouth:  pharyngeal erythema. anterior cervical nodes Lungs:  rhonchi bilaterally minimal and scar cough wheeze at both basesno dullness and no crackles.   Heart:  Normal rate and regular rhythm. S1 and S2 normal without gallop, murmur, click, rub or other extra sounds. Abdomen:  Bowel sounds positive,abdomen soft and non-tender without masses, organomegaly or hernias noted.   Impression & Recommendations:  Problem # 1:  ACUTE PHARYNGITIS (ICD-462) Assessment New  Her updated medication list for this problem includes:    Zithromax Z-pak 250 Mg Tabs (Azithromycin) .Marland Kitchen... 2 stat then 1 qd  Orders: Prescription Created Electronically [phone])  Problem # 2:  ACUTE BRONCHITIS (ICD-466.0)  Her updated medication list for this problem includes:    Zithromax Z-pak 250 Mg Tabs (Azithromycin) .Marland Kitchen... 2 stat then 1 qd    Delsym 30 Mg/19ml Lqcr (Dextromethorphan polistirex) .Marland KitchenMarland KitchenMarland KitchenMarland Kitchen  2 tsp q 8h for cough  Problem # 3:  CERVICAL RADICULOPATHY (ICD-723.4) Assessment: Unchanged  Problem # 4:  IRRITABLE BOWEL SYNDROME (ICD-564.1) Assessment: Improved  Problem # 5:  GERD (ICD-530.81) Assessment: Improved  Her updated  medication list for this problem includes:    Nexium 40 Mg Cpdr (Esomeprazole magnesium) .Marland Kitchen... Dr Phylliss Bob once daily    Hyoscyamine Sulfate Cr 0.375 Mg Xr12h-tab (Hyoscyamine sulfate) .Marland Kitchen... 1 am and pm for irritable bowel  Problem # 6:  FIBROMYALGIA (ICD-729.1) Assessment: Unchanged  Complete Medication List: 1)  Slow-mag 535 (64 Mg) Mg Tbcr (Magnesium chloride) .... Take 1 tablet by mouth twice a day 2)  Lexapro 10 Mg Tabs (Escitalopram oxalate) .... Take 1 tabv qd 3)  Torsemide 10 Mg Tabs (Torsemide) .... Once daily 4)  Nexium 40 Mg Cpdr (Esomeprazole magnesium) .... Dr Phylliss Bob once daily 5)  Nortriptyline Hcl 25 Mg Caps (Nortriptyline hcl) .... Dr Phylliss Bob  1 by mouth in the evening 6)  Bisoprolol Fumarate 5 Mg Tabs (Bisoprolol fumarate) .... Dr Lucas Mallow  1 in the pm 7)  Zetia 10 Mg Tabs (Ezetimibe) .... Once daily 8)  Lunesta 3 Mg Tabs (Eszopiclone) .... Dr Phylliss Bob 9)  Boniva 150 Mg Tabs (Ibandronate sodium) .... Dr Phylliss Bob 10)  Simvastatin 40 Mg Tabs (Simvastatin) .Marland Kitchen.. 1 by mouth in the evening 11)  Gabapentin 300 Mg Caps (Gabapentin) .... Dr Terri Piedra 12)  Hydroxyzine Hcl 10 Mg Tabs (Hydroxyzine hcl) .... Dr Terri Piedra 13)  Premarin 0.45 Mg Tabs (Estrogens conjugated) .Marland Kitchen.. 1 qd 14)  Hyoscyamine Sulfate Cr 0.375 Mg Xr12h-tab (Hyoscyamine sulfate) .Marland Kitchen.. 1 am and pm for irritable bowel 15)  Lomotil 2.5-0.025 Mg Tabs (Diphenoxylate-atropine) .... 2 qid ac, hs for diarrhea 16)  Align Caps (Misc intestinal flora regulat) .Marland Kitchen.. 1 qd 17)  Zithromax Z-pak 250 Mg Tabs (Azithromycin) .... 2 stat then 1 qd 18)  Delsym 30 Mg/59ml Lqcr (Dextromethorphan polistirex) .... 2 tsp q 8h for cough  Lipid Assessment/Plan:      Based on NCEP/ATP III, the patient's risk factor category is "0-1 risk factors".  The patient's lipid goals are as follows: Total cholesterol goal is 200; LDL cholesterol goal is 160; HDL cholesterol goal is 40; Triglyceride goal is 150.     Patient Instructions: 1)  you have acute turn died [place] and also  acute bronchitis 2)  Minimize activity for the next 2-3 days 3)  Have prescribed a Z-Pak for infection 4)  Tells him cough medication as needed 5)  Have a good fluid intake 6)  Continue regular medications 7)  Call or return if not improved Prescriptions: ZITHROMAX Z-PAK 250 MG TABS (AZITHROMYCIN) 2 stat then 1 qd  #1 pkge x 0   Entered and Authorized by:   Judithann Sheen MD   Signed by:   Judithann Sheen MD on 08/09/2009   Method used:   Electronically to        [facility]* (retail)       [address]       [place], [place]  [id number]       Ph: [phone]       Fax: [phone]   RxID:   [id number]

## 2010-10-22 NOTE — Assessment & Plan Note (Signed)
Summary: abd pain/pt decline to see another doc/njr   Vital Signs:  Patient profile:   77 year old female Height:      65 inches Weight:      158 pounds BMI:     26.39 O2 Sat:      94 % Temp:     97.8 degrees F Pulse rate:   94 / minute Pulse rhythm:   regular BP sitting:   120 / 74  (left arm)  Vitals Entered By: Pura Spice, RN (March 06, 2009 2:52 PM)  Prevention & Chronic Care Immunizations   Influenza vaccine: Fluvax MCR  (07/22/2007)    Tetanus booster: 09/22/1997: Historical    Pneumococcal vaccine: Not documented    H. zoster vaccine: 09/23/2007: Zostavax  Colorectal Screening   Hemoccult: Not documented    Colonoscopy: Not documented  Other Screening   Pap smear: Interpretation/Result:Negative for intraepithelial Lesion or Malignancy.     (07/28/2007)    Mammogram: Not documented    DXA bone density scan: Not documented    Smoking status: quit  (04/19/2007)  Lipids   Total Cholesterol: 179  (08/04/2008)   LDL: DEL  (08/04/2008)   LDL Direct: 93.3  (08/04/2008)   HDL: 48.9  (08/04/2008)   Triglycerides: 203  (08/04/2008)    SGOT (AST): 27  (08/04/2008)   SGPT (ALT): 21  (08/04/2008)   Alkaline phosphatase: 75  (08/04/2008)   Total bilirubin: 0.5  (08/04/2008)  Self-Management Support :   Last medical nutrition therapy: Not documented    Lipid self-management support: Not documented   CC: dirrrhea about 4x a day has been ongoing for 4 days c/o bloating with gas    History of Present Illness: Pt in with hx of diarrhea 4x per day for last 4 days. Has occured 4 times  since January. considerable bloating and gas. diarrhea uncontrollable at times. Has episodes rumbling and other symptoms compatable with IBS. slight nausea at times. No blood in stool, stols often firm then changes to liquid stools, increase gas complains rash both arms , neurological distribution Pt has had shingles vaccine  Allergies: 1)  ! Sulfa 2)  ! Macrodantin  (Nitrofurantoin Macrocrystal) 3)  ! Demerol 4)  ! Ceftin 5)  ! * Hismanal 6)  ! * Maprobamate 7)  ! Codeine  Review of Systems      See HPI General:  See HPI; Complains of malaise. ENT:  Denies decreased hearing, difficulty swallowing, ear discharge, earache, hoarseness, nasal congestion, nosebleeds, postnasal drainage, ringing in ears, sinus pressure, and sore throat. CV:  Denies bluish discoloration of lips or nails, chest pain or discomfort, difficulty breathing at night, difficulty breathing while lying down, fainting, fatigue, leg cramps with exertion, lightheadness, near fainting, palpitations, shortness of breath with exertion, swelling of feet, swelling of hands, and weight gain. Resp:  Denies chest discomfort, chest pain with inspiration, cough, coughing up blood, excessive snoring, hypersomnolence, morning headaches, pleuritic, shortness of breath, sputum productive, and wheezing. GI:  See HPI; Complains of abdominal pain and diarrhea. GU:  Denies abnormal vaginal bleeding, decreased libido, discharge, dysuria, genital sores, hematuria, incontinence, nocturia, urinary frequency, and urinary hesitancy. MS:  Complains of muscle aches and muscle; dx fibromyalgia, Dr. Phylliss Bob.  Physical Exam  General:  Well-developed,well-nourished,in no acute distress; alert,appropriate and cooperative throughout examination Head:  Normocephalic and atraumatic without obvious abnormalities. No apparent alopecia or balding. Eyes:  No corneal or conjunctival inflammation noted. EOMI. Perrla. Funduscopic exam benign, without hemorrhages, exudates or papilledema. Vision grossly  normal. Ears:  External ear exam shows no significant lesions or deformities.  Otoscopic examination reveals clear canals, tympanic membranes are intact bilaterally without bulging, retraction, inflammation or discharge. Hearing is grossly normal bilaterally. Nose:  External nasal examination shows no deformity or inflammation. Nasal  mucosa are pink and moist without lesions or exudates. Mouth:  Oral mucosa and oropharynx without lesions or exudates.  Teeth in good repair. Lungs:  Normal respiratory effort, chest expands symmetrically. Lungs are clear to auscultation, no crackles or wheezes. Heart:  Normal rate and regular rhythm. S1 and S2 normal without gallop, murmur, click, rub or other extra sounds. Abdomen:  tenderness over lower abdomen,no distention, no masses, no rigidity, no abdominal hernia, no hepatomegaly, no splenomegaly, and bowel sounds hyperactive.   Rectal:  NE Skin:  nonvesicular rash both upper arms , over brachial nerve distribution   Impression & Recommendations:  Problem # 1:  IRRITABLE BOWEL SYNDROME (ICD-564.1) Assessment New  Orders: Prescription Created Electronically [id number])  Problem # 2:  DIARRHEA, RECURRENT (ICD-787.91) Assessment: New  Her updated medication list for this problem includes:    Lomotil 2.5-0.025 Mg Tabs (Diphenoxylate-atropine) .Marland Kitchen... 2 qid ac, hs for diarrhea    Align Caps (Misc intestinal flora regulat) .Marland Kitchen... 1 qd  Problem # 3:  GERD (ICD-530.81) Assessment: Unchanged  Her updated medication list for this problem includes:    Nexium 40 Mg Cpdr (Esomeprazole magnesium) .Marland Kitchen... Dr Phylliss Bob once daily    Hyoscyamine Sulfate Cr 0.375 Mg Xr12h-tab (Hyoscyamine sulfate) .Marland Kitchen... 1 am and pm for irritable bowel  Problem # 4:  FIBROMYALGIA (ICD-729.1) Assessment: Unchanged  Problem # 5:  DERMATITIS (ICD-692.9) Assessment: Unchanged  Problem # 6:  CERVICAL RADICULOPATHY (ICD-723.4) Assessment: New gabapentin 300 mg tid  Complete Medication List: 1)  Slow-mag 535 (64 Mg) Mg Tbcr (Magnesium chloride) .... Take 1 tablet by mouth twice a day 2)  Lexapro 10 Mg Tabs (Escitalopram oxalate) .... Take 1 tabv qd 3)  Torsemide 10 Mg Tabs (Torsemide) .... Once daily 4)  Nexium 40 Mg Cpdr (Esomeprazole magnesium) .... Dr Phylliss Bob once daily 5)  Nortriptyline Hcl 25 Mg Caps (Nortriptyline  hcl) .... Dr Phylliss Bob  1 by mouth in the evening 6)  Bisoprolol Fumarate 5 Mg Tabs (Bisoprolol fumarate) .... Dr Lucas Mallow  1 in the pm 7)  Zetia 10 Mg Tabs (Ezetimibe) .... Once daily 8)  Lunesta 3 Mg Tabs (Eszopiclone) .... Dr Phylliss Bob 9)  Boniva 150 Mg Tabs (Ibandronate sodium) .... Dr Phylliss Bob 10)  Simvastatin 40 Mg Tabs (Simvastatin) .Marland Kitchen.. 1 by mouth in the evening 11)  Gabapentin 300 Mg Caps (Gabapentin) .... Dr Terri Piedra 12)  Hydroxyzine Hcl 10 Mg Tabs (Hydroxyzine hcl) .... Dr Terri Piedra 13)  Premarin 0.45 Mg Tabs (Estrogens conjugated) .Marland Kitchen.. 1 qd 14)  Hyoscyamine Sulfate Cr 0.375 Mg Xr12h-tab (Hyoscyamine sulfate) .Marland Kitchen.. 1 am and pm for irritable bowel 15)  Lomotil 2.5-0.025 Mg Tabs (Diphenoxylate-atropine) .... 2 qid ac, hs for diarrhea 16)  Align Caps (Misc intestinal flora regulat) .Marland Kitchen.. 1 qd  Patient Instructions: 1)  Irritable bowel syndrome with diarrhea, often uncontrollable 2)  to tx align, hyoscyamine and lomotil when severe Prescriptions: LOMOTIL 2.5-0.025 MG TABS (DIPHENOXYLATE-ATROPINE) 2 qid ac, hs for diarrhea  #60 x 5   Entered and Authorized by:   Judithann Sheen MD   Signed by:   Judithann Sheen MD on 03/06/2009   Method used:   Print then Give to Patient   RxID:   [id number] HYOSCYAMINE SULFATE CR 0.375 MG  XR12H-TAB (HYOSCYAMINE SULFATE) 1 AM and PM for irritable bowel  #60 x 11   Entered and Authorized by:   Judithann Sheen MD   Signed by:   Judithann Sheen MD on 03/06/2009   Method used:   Electronically to        [facility]* (retail)       [address]       [place], [place]  [id number]       Ph: [phone]       Fax: [phone]   RxID:   [id number]     Immunization History:  Zostavax History:    Zostavax # 1:  zostavax (09/23/2007)

## 2010-10-22 NOTE — Progress Notes (Signed)
Summary: zostervax given at [facility]        Other Immunization History:    Zostavax # 1:  Zostavax (09/20/2007) zoster given at [facility]

## 2010-10-22 NOTE — Progress Notes (Signed)
Summary: lyrica  Phone Note Call from Patient   Caller: live Call For: stafford Summary of Call: Remembered she has already tried Lyrica & had bad side effects.  She'll continue the Gabapentin from the other doctor &return the Lyrica samples sometime soon.   Initial call taken by: Rudy Jew, RN,  March 07, 2009 2:45 PM  Follow-up for Phone Call        ok dr stafford aware.  Follow-up by: Pura Spice, RN,  March 07, 2009 3:07 PM

## 2010-10-22 NOTE — Progress Notes (Signed)
Summary: REFILL premarin x 1  Phone Note From Pharmacy   Caller: [facility] Call For: [phone]  Summary of Call: PREMARIN .625 MG TAB TAKE ONE TABLET EACH DAY #31 LAST FILL 06/09/07 Initial call taken by: Roselle Locus,  July 13, 2007 4:13 PM

## 2010-10-22 NOTE — Assessment & Plan Note (Signed)
Summary: KNEE PAIN // RS   Vital Signs:  Patient profile:   77 year old female Menstrual status:  postmenopausal Height:      62.5 inches (158.75 cm) Weight:      153 pounds (69.55 kg) O2 Sat:      96 % on Room air Temp:     98.4 degrees F (36.89 degrees C) oral Pulse rate:   68 / minute BP sitting:   162 / 72  (left arm) Cuff size:   regular  Vitals Entered By: Josph Macho RMA (May 20, 2010 11:51 AM)  O2 Flow:  Room air  Serial Vital Signs/Assessments:  Time      Position  BP       Pulse  Resp  Temp     By                     134/68                         Danise Edge MD  CC: Right knee pain Xseveral months/ CF, C-V Risk Management Is Patient Diabetic? No   History of Present Illness: Patient with recurrent right knee pain over many years. This flare has been present for several months. She has had to have steroid injections in the past and usually has a good response to them. Her last injection was about a year ago with her Rheumatologist and she did not have any more trouble for several months. She denies any injury/falls/twists to start this episode. No swelling/warmth/redness/numbness or weakness associated. No recent illness/f/c/CP/palp/SOB/radicular symptoms. Patient was having a great deal of trouble with IBS and recurrent diarrhea but that has been managed well with Align and Hyoscyamine  Cardiovascular Risk History:      Positive major cardiovascular risk factors include female age 64 years old or older and hyperlipidemia.  Negative major cardiovascular risk factors include non-tobacco-user status.    Current Medications (verified): 1)  Lexapro 10 Mg  Tabs (Escitalopram Oxalate) .... Take 1 Tabv Qd 2)  Nexium 40 Mg  Cpdr (Esomeprazole Magnesium) .Marland Kitchen.. 1 Once Daily For Gerd 3)  Nortriptyline Hcl 25 Mg  Caps (Nortriptyline Hcl) .... Dr Phylliss Bob  1 By Mouth in The Evening 4)  Bisoprolol Fumarate 5 Mg  Tabs (Bisoprolol Fumarate) .... Dr Lucas Mallow  1 in The Pm 5)  Zetia 10  Mg  Tabs (Ezetimibe) .... Once Daily 6)  Lunesta 3 Mg  Tabs (Eszopiclone) .... Dr Phylliss Bob 7)  Boniva 150 Mg  Tabs (Ibandronate Sodium) .... Dr Phylliss Bob 8)  Simvastatin 40 Mg Tabs (Simvastatin) .Marland Kitchen.. 1 By Mouth in The Evening 9)  Gabapentin 300 Mg Caps (Gabapentin) .... Dr Terri Piedra 10)  Hydroxyzine Hcl 10 Mg Tabs (Hydroxyzine Hcl) .... Dr Terri Piedra 11)  Premarin 0.45 Mg Tabs (Estrogens Conjugated) .Marland Kitchen.. 1 Qd 12)  Hyoscyamine Sulfate Cr 0.375 Mg Xr12h-Tab (Hyoscyamine Sulfate) .Marland Kitchen.. 1 Am and Pm For Irritable Bowel 13)  Lomotil 2.5-0.025 Mg Tabs (Diphenoxylate-Atropine) .... 2 Qid Ac, Hs For Diarrhea 14)  Align  Caps (Misc Intestinal Flora Regulat) .Marland Kitchen.. 1 Qd 15)  Torsemide 10 Mg Tabs (Torsemide) .Marland Kitchen.. 1 Qd 16)  Metronidazole 0.75 % Gel (Metronidazole) .... Insert 1/2 Applicator At Bedtime For Vaginitis 17)  Mag-Delay 535 (64 Mg) Mg Cr-Tabs (Magnesium Chloride) .Marland Kitchen.. 1 By Mouth Two Times A Day 18)  Tramadol Hcl 50 Mg Tabs (Tramadol Hcl) .Marland Kitchen.. 1-2 Q4h As Needed Pain  Hold Until Patient Calls  Allergies (verified):  1)  ! Sulfa 2)  ! Macrodantin (Nitrofurantoin Macrocrystal) 3)  ! Demerol 4)  ! Ceftin 5)  ! * Hismanal 6)  ! * Maprobamate 7)  ! Codeine  Past History:  Past medical history reviewed for relevance to current acute and chronic problems. Social history (including risk factors) reviewed for relevance to current acute and chronic problems.  Past Medical History: Reviewed history from 08/09/2009 and no changes required. last pap  2006  2008  mamogram  2009  colon           2005  DEXA          2009  EKG   ------DR Jose Persia    FORMER  Fibromyalgia  Social History: Reviewed history from 04/19/2007 and no changes required. Retired Divorced Former Smoker Alcohol use-yes Drug use-no Regular exercise-no  Review of Systems      See HPI  Physical Exam  General:  Well-developed,well-nourished,in no acute distress; alert,appropriate and cooperative throughout examination Head:   Normocephalic and atraumatic without obvious abnormalities. No apparent alopecia or balding. Mouth:  Oral mucosa and oropharynx without lesions or exudates.  Teeth in good repair. Neck:  No deformities, masses, or tenderness noted. Lungs:  Normal respiratory effort, chest expands symmetrically. Lungs are clear to auscultation, no crackles or wheezes. Heart:  Normal rate and regular rhythm. S1 and S2 normal without gallop, murmur, click, rub or other extra sounds. Abdomen:  Bowel sounds positive,abdomen soft and non-tender without masses, organomegaly or hernias noted. Msk:  No deformity or scoliosis noted of thoracic or lumbar spine.   Pulses:  R and posterior tibial pulses are full and equal bilaterally Extremities:  No clubbing, cyanosis, edema, or deformity noted with normal full range of motion of all joints.  Mild pain with palpation over patellar ligament on right, very small effusion noted at this site. No other joint line tenderness or ligamentous laxity Neurologic:  No cranial nerve deficits noted. Station and gait are normal. Plantar reflexes are down-going bilaterally. DTRs are symmetrical throughout. Sensory, motor and coordinative functions appear intact. Psych:  Cognition and judgment appear intact. Alert and cooperative with normal attention span and concentration. No apparent delusions, illusions, hallucinations   Impression & Recommendations:  Problem # 1:  KNEE PAIN, RIGHT (ICD-719.46)  Her updated medication list for this problem includes:    Tramadol Hcl 50 Mg Tabs (Tramadol hcl) .Marland Kitchen... 1-2 q4h as needed pain  hold until patient calls    Meloxicam 7.5 Mg Tabs (Meloxicam) .Marland Kitchen... 1-2 tabs by mouth once daily as needed pain with food  Given samples of Pennsaid to try as directed, call Rheumatology for injection or here if symptoms persist  Problem # 2:  IRRITABLE BOWEL SYNDROME (ICD-564.1) Given samples of Align to use daily  Complete Medication List: 1)  Lexapro 10 Mg Tabs  (Escitalopram oxalate) .... Take 1 tabv qd 2)  Nexium 40 Mg Cpdr (Esomeprazole magnesium) .Marland Kitchen.. 1 once daily for gerd 3)  Nortriptyline Hcl 25 Mg Caps (Nortriptyline hcl) .... Dr Phylliss Bob  1 by mouth in the evening 4)  Bisoprolol Fumarate 5 Mg Tabs (Bisoprolol fumarate) .... Dr Lucas Mallow  1 in the pm 5)  Zetia 10 Mg Tabs (Ezetimibe) .... Once daily 6)  Lunesta 3 Mg Tabs (Eszopiclone) .... Dr Phylliss Bob 7)  Boniva 150 Mg Tabs (Ibandronate sodium) .... Dr Phylliss Bob 8)  Simvastatin 40 Mg Tabs (Simvastatin) .Marland Kitchen.. 1 by mouth in the evening 9)  Gabapentin 300 Mg Caps (Gabapentin) .... Dr Terri Piedra 10)  Hydroxyzine Hcl 10 Mg Tabs (Hydroxyzine hcl) .... Dr Terri Piedra 11)  Premarin 0.45 Mg Tabs (Estrogens conjugated) .Marland Kitchen.. 1 qd 12)  Hyoscyamine Sulfate Cr 0.375 Mg Xr12h-tab (Hyoscyamine sulfate) .Marland Kitchen.. 1 am and pm for irritable bowel 13)  Lomotil 2.5-0.025 Mg Tabs (Diphenoxylate-atropine) .... 2 qid ac, hs for diarrhea 14)  Align Caps (Misc intestinal flora regulat) .Marland Kitchen.. 1 qd 15)  Torsemide 10 Mg Tabs (Torsemide) .Marland Kitchen.. 1 qd 16)  Metronidazole 0.75 % Gel (Metronidazole) .... Insert 1/2 applicator at bedtime for vaginitis 17)  Mag-delay 535 (64 Mg) Mg Cr-tabs (Magnesium chloride) .Marland Kitchen.. 1 by mouth two times a day 18)  Tramadol Hcl 50 Mg Tabs (Tramadol hcl) .Marland Kitchen.. 1-2 q4h as needed pain  hold until patient calls 19)  Meloxicam 7.5 Mg Tabs (Meloxicam) .Marland Kitchen.. 1-2 tabs by mouth once daily as needed pain with food 20)  Pennsaid 1.5 % Soln (Diclofenac sodium) .Marland Kitchen.. 1 drop to 4 sides of right knee up to twice a day as needed pain  Cardiovascular Risk Assessment/Plan:      The patient's hypertensive risk group is category B: At least one risk factor (excluding diabetes) with no target organ damage.  Her calculated 10 year risk of coronary heart disease is 9 %.  Today's blood pressure is 162/72.    Patient Instructions: 1)  You may move around but avoid painful motions. Apply ice to sore area for 20 minutes 3-4 times a day for 2-3 days.  2)  Please  schedule a follow-up appointment as needed if symptoms worsen or do not improve. 3)  May use 7.5 or 15 mg daily of the Meloxciam to help with pain if no improvement call your Rheumatologist or here to schedule a joint injection  Prescriptions: PENNSAID 1.5 % SOLN (DICLOFENAC SODIUM) 1 drop to 4 sides of right knee up to twice a day as needed pain  #2 bottles x o   Entered and Authorized by:   Danise Edge MD   Signed by:   Danise Edge MD on 05/20/2010   Method used:   Samples Given   RxID:   [id number] MELOXICAM 7.5 MG TABS (MELOXICAM) 1-2 tabs by mouth once daily as needed pain with food  #30 x 1   Entered and Authorized by:   Danise Edge MD   Signed by:   Danise Edge MD on 05/20/2010   Method used:   Electronically to        [facility]* (retail)       [address]       [place], [place]  [id number]       Ph: [phone]       Fax: [phone]   RxID:   [id number]

## 2010-10-22 NOTE — Assessment & Plan Note (Signed)
Summary: emp/pt fasting/per Gina/cjr   Vital Signs:  Patient profile:   77 year old female Menstrual status:  postmenopausal Height:      62.5 inches Weight:      155 pounds BMI:     28.00 O2 Sat:      94 % Temp:     97.9 degrees F Pulse rate:   64 / minute BP sitting:   134 / 82  (left arm)  Vitals Entered By: Pura Spice, RN (September 20, 2009 8:44 AM) Is Patient Diabetic? No     Menstrual Status postmenopausal Last PAP Date 2008 Last PAP Result Interpretation/Result:Negative for intraepithelial Lesion or Malignancy.      History of Present Illness: this 77 year old white female is in to review her medical problems and also has some new problems to discuss. She continues to have a problem with fibromyalgia and under the care of Dr. Chase Picket, rheumatologist patient had problems taking Lyrica or any other medications he is prescribed for fibromyalgia. She continues to have her IBS problems he is he does not take hyoscyamine She continues to have her low back pain and needs continued she relates she also falls 2-8 chronic ankle problem of the right ankle She desires to continue taking Premarin for her postmenopausal syndrome symptoms a cough when she does not take Premarin she feels are.Dhorrible She complained of a new problem of that she states was in use the left hand which is really some weakness and this is related to shoulder and neck discomfort, in fact has had previous cervical laminectomy and has some cervical arthritis which might be causing her problem Relates she has a slight vaginal discharge which is yellow in color and minimal discomfort No other complaint   Preventive Screening-Counseling & Management  Alcohol-Tobacco     Smoking Status: quit     Year Quit: 1990  Allergies: 1)  ! Sulfa 2)  ! Macrodantin (Nitrofurantoin Macrocrystal) 3)  ! Demerol 4)  ! Ceftin 5)  ! * Hismanal 6)  ! * Maprobamate 7)  ! Codeine  Past History:  Past Medical History:  Last updated: 08/09/2009 last pap  2006  2008  mamogram  2009  colon           2005  DEXA          2009  EKG   ------DR Jose Persia    FORMER  Fibromyalgia  Past Surgical History: Last updated: 04/19/2007 Hysterectomy-1981 Laminectomy-1960 Veins Stripped-1970 Back sx-1997 Anterior Cervicle Vertibrea Reconstruction-2003  Social History: Last updated: 04/19/2007 Retired Divorced Former Smoker Alcohol use-yes Drug use-no Regular exercise-no  Risk Factors: Smoking Status: quit (09/20/2009)  Past History:  Care Management: Cardiology: Dr Patty Sermons Rheumatology: Dr Phylliss Bob   Family History: Family History of Arthritis Family History High cholesterol Family History Hypertension Family History Other cancer Fam hx Stroke fam hx Emotional/Mental Illness  hystrectomy partial 1981  Review of Systems  The patient denies anorexia, fever, weight loss, weight gain, vision loss, decreased hearing, hoarseness, chest pain, syncope, dyspnea on exertion, peripheral edema, prolonged cough, headaches, hemoptysis, abdominal pain, melena, hematochezia, severe indigestion/heartburn, hematuria, incontinence, genital sores, muscle weakness, suspicious skin lesions, transient blindness, difficulty walking, depression, unusual weight change, abnormal bleeding, enlarged lymph nodes, angioedema, breast masses, and testicular masses.    Physical Exam  General:  Well-developed,well-nourished,in no acute distress; alert,appropriate and cooperative throughout examination Head:  Normocephalic and atraumatic without obvious abnormalities. No apparent alopecia or balding. Eyes:  No corneal or conjunctival inflammation  noted. EOMI. Perrla. Funduscopic exam benign, without hemorrhages, exudates or papilledema. Vision grossly normal. Ears:  External ear exam shows no significant lesions or deformities.  Otoscopic examination reveals clear canals, tympanic membranes are intact bilaterally without  bulging, retraction, inflammation or discharge. Hearing is grossly normal bilaterally. Nose:  External nasal examination shows no deformity or inflammation. Nasal mucosa are pink and moist without lesions or exudates. Mouth:  Oral mucosa and oropharynx without lesions or exudates.  Teeth in good repair. Neck:  tenderness left lateral cervical spine Chest Wall:  No deformities, masses, or tenderness noted. Breasts:  No mass, nodules, thickening, tenderness, bulging, retraction, inflamation, nipple discharge or skin changes noted.   Lungs:  Normal respiratory effort, chest expands symmetrically. Lungs are clear to auscultation, no crackles or wheezes. Heart:  Normal rate and regular rhythm. S1 and S2 normal without gallop, murmur, click, rub or other extra sounds. Abdomen:  hyperactive bowel sounds otherwise negativeno guarding, no rigidity, no rebound tenderness, no abdominal hernia, no inguinal hernia, no hepatomegaly, and no splenomegaly.   Rectal:  No external abnormalities noted. Normal sphincter tone. No rectal masses or tenderness. Genitalia:  external genitalia negative, vaginal exam reveals erythematous mucosa mild with slight yellow discharge Msk:  tender left lateral cervical spine 3 through 7 Tenderness acromioclavicular bursa on the left Pulses:  R and L carotid,radial,femoral,dorsalis pedis and posterior tibial pulses are full and equal bilaterally Extremities:  No clubbing, cyanosis, edema, or deformity noted with normal full range of motion of all joints.   Neurologic:  No cranial nerve deficits noted. Station and gait are normal. Plantar reflexes are down-going bilaterally. DTRs are symmetrical throughout. Sensory, motor and coordinative functions appear intact. Skin:  Intact without suspicious lesions or rashes Cervical Nodes:  No lymphadenopathy noted Axillary Nodes:  No palpable lymphadenopathy Inguinal Nodes:  No significant adenopathy Psych:  Cognition and judgment appear  intact. Alert and cooperative with normal attention span and concentration. No apparent delusions, illusions, hallucinations   Impression & Recommendations:  Problem # 1:  BURSITIS, ACROMIOCLAVICULAR, LEFT (ICD-726.10) Assessment New  Orders: Depo- Medrol 80mg  (J1040) Depo- Medrol 40mg  (J1030) Admin of Therapeutic Inj  intramuscular or subcutaneous (16109)  Problem # 2:  CERVICAL RADICULOPATHY (ICD-723.4) Assessment: Deteriorated  Problem # 3:  IRRITABLE BOWEL SYNDROME (ICD-564.1) Assessment: Unchanged  Problem # 4:  DIARRHEA, RECURRENT (ICD-787.91) Assessment: Unchanged  Her updated medication list for this problem includes:    Lomotil 2.5-0.025 Mg Tabs (Diphenoxylate-atropine) .Marland Kitchen... 2 qid ac, hs for diarrhea    Align Caps (Misc intestinal flora regulat) .Marland Kitchen... 1 qd  Problem # 5:  GERD (ICD-530.81) Assessment: Unchanged  Her updated medication list for this problem includes:    Nexium 40 Mg Cpdr (Esomeprazole magnesium) .Marland Kitchen... 1 once daily for gerd    Hyoscyamine Sulfate Cr 0.375 Mg Xr12h-tab (Hyoscyamine sulfate) .Marland Kitchen... 1 am and pm for irritable bowel  Problem # 6:  FIBROMYALGIA (ICD-729.1) Assessment: Unchanged  Problem # 7:  OSTEOPENIA (ICD-733.90) Assessment: Unchanged  Her updated medication list for this problem includes:    Boniva 150 Mg Tabs (Ibandronate sodium) .Marland Kitchen... Dr Phylliss Bob  Orders: T-Vitamin D (25-Hydroxy) [phone])  Problem # 8:  DERMATITIS (ICD-692.9) Assessment: Unchanged  Problem # 9:  HYPERLIPIDEMIA (ICD-272.4) Assessment: Improved  Her updated medication list for this problem includes:    Zetia 10 Mg Tabs (Ezetimibe) ..... Once daily    Simvastatin 40 Mg Tabs (Simvastatin) .Marland Kitchen... 1 by mouth in the evening  Orders: Venipuncture (91478) TLB-Hepatic/Liver Function Pnl (80076-HEPATIC)  Problem #  10:  POSTMENOPAUSAL ON HORMONE REPLACEMENT THERAPY (ICD-V07.4) Assessment: Improved  Orders: Prescription Created Electronically [phone])   Complete Medication List: 1)  Lexapro 10 Mg Tabs (Escitalopram oxalate) .... Take 1 tabv qd 2)  Nexium 40 Mg Cpdr (Esomeprazole magnesium) .Marland Kitchen.. 1 once daily for gerd 3)  Nortriptyline Hcl 25 Mg Caps (Nortriptyline hcl) .... Dr Phylliss Bob  1 by mouth in the evening 4)  Bisoprolol Fumarate 5 Mg Tabs (Bisoprolol fumarate) .... Dr Lucas Mallow  1 in the pm 5)  Zetia 10 Mg Tabs (Ezetimibe) .... Once daily 6)  Lunesta 3 Mg Tabs (Eszopiclone) .... Dr Phylliss Bob 7)  Boniva 150 Mg Tabs (Ibandronate sodium) .... Dr Phylliss Bob 8)  Simvastatin 40 Mg Tabs (Simvastatin) .Marland Kitchen.. 1 by mouth in the evening 9)  Gabapentin 300 Mg Caps (Gabapentin) .... Dr Terri Piedra 10)  Hydroxyzine Hcl 10 Mg Tabs (Hydroxyzine hcl) .... Dr Terri Piedra 11)  Premarin 0.45 Mg Tabs (Estrogens conjugated) .Marland Kitchen.. 1 qd 12)  Hyoscyamine Sulfate Cr 0.375 Mg Xr12h-tab (Hyoscyamine sulfate) .Marland Kitchen.. 1 am and pm for irritable bowel 13)  Lomotil 2.5-0.025 Mg Tabs (Diphenoxylate-atropine) .... 2 qid ac, hs for diarrhea 14)  Align Caps (Misc intestinal flora regulat) .Marland Kitchen.. 1 qd 15)  Torsemide 10 Mg Tabs (Torsemide) .Marland Kitchen.. 1 qd 16)  Metronidazole 0.75 % Gel (Metronidazole) .... Insert 1/2 applicator at bedtime for vaginitis 17)  Mag-delay 535 (64 Mg) Mg Cr-tabs (Magnesium chloride) .Marland Kitchen.. 1 by mouth two times a day  Other Orders: UA Dipstick w/o Micro (automated)  (81003) TLB-Lipid Panel (80061-LIPID) TLB-BMP (Basic Metabolic Panel-BMET) (80048-METABOL) TLB-CBC Platelet - w/Differential (85025-CBCD) TLB-TSH (Thyroid Stimulating Hormone) (14782-NFA)  Patient Instructions: 1)  into lateral field that you do her well but continues to have multiple medical problems which we will continue to have to treat. I definitely feel that she should continue seeing Dr. Okey Dupre as we have discussed previously. I think we should continue the regular medications at your now taking If the Depo-Medrol does not help the left hand and arm off and we should see a neurosurgeon regarding the cervical radiculopathy  and possible effect on the left arm and hand 2)  We'll call lab results Prescriptions: MAG-DELAY 535 (64 MG) MG CR-TABS (MAGNESIUM CHLORIDE) 1 by mouth two times a day  #60 x 11   Entered by:   Pura Spice, RN   Authorized by:   Judithann Sheen MD   Signed by:   Joanne Chars CMA on 09/20/2009   Method used:   Telephoned to ...       [facility]* (retail)       [address]       [place], [place]  [address]       Ph: [phone]       Fax: [phone]   RxID:   [id number] METRONIDAZOLE 0.75 % GEL (METRONIDAZOLE) Insert 1/2 applicator at bedtime for vaginitis  #45 gms x 1   Entered and Authorized by:   Judithann Sheen MD   Signed by:   Judithann Sheen MD on 09/20/2009   Method used:   Electronically to        [facility]* (retail)       [address]       [place], [place]  [address]       Ph: [phone]       Fax: [phone]   RxID:   [id number] HYOSCYAMINE SULFATE CR 0.375 MG XR12H-TAB (HYOSCYAMINE SULFATE) 1 AM and PM  for irritable bowel  #60 x 11   Entered and Authorized by:   Judithann Sheen MD   Signed by:   Judithann Sheen MD on 09/20/2009   Method used:   Electronically to        [facility]* (retail)       [address]       [place], [place]  [address]       Ph: [phone]       Fax: [phone]   RxID:   [id number] PREMARIN 0.45 MG TABS (ESTROGENS CONJUGATED) 1 qd  #30 x 11   Entered and Authorized by:   Judithann Sheen MD   Signed by:   Judithann Sheen MD on 09/20/2009   Method used:   Electronically to        [facility]* (retail)       [address]       [place], [place]  [address]       Ph: [phone]       Fax: [phone]   RxID:   [id number] SIMVASTATIN 40 MG TABS (SIMVASTATIN) 1 by mouth in the evening  #30 x 11   Entered and Authorized by:   Judithann Sheen MD   Signed by:    Judithann Sheen MD on 09/20/2009   Method used:   Electronically to        [facility]* (retail)       [address]       [place], [place]  [address]       Ph: [phone]       Fax: [phone]   RxID:   [id number] ZETIA 10 MG  TABS (EZETIMIBE) once daily  #30 x 11   Entered and Authorized by:   Judithann Sheen MD   Signed by:   Judithann Sheen MD on 09/20/2009   Method used:   Electronically to        [facility]* (retail)       [address]       [place], [place]  [address]       Ph: [phone]       Fax: [phone]   RxID:   [id number] NEXIUM 40 MG  CPDR (ESOMEPRAZOLE MAGNESIUM) 1 once daily FOR GERD  #30 x 11   Entered and Authorized by:   Judithann Sheen MD   Signed by:   Judithann Sheen MD on 09/20/2009   Method used:   Electronically to        [facility]* (retail)       [address]       [place], [place]  [address]       Ph: [phone]       Fax: [phone]   RxID:   [id number] TORSEMIDE 10 MG TABS (TORSEMIDE) 1 QD  #30 x 11   Entered and Authorized by:   Judithann Sheen MD   Signed by:   Judithann Sheen MD on 09/20/2009   Method used:   Electronically to        [facility]* (retail)       [address]       [place], [place]  [address]       Ph: [phone]       Fax: [phone]   RxID:  [id number] LEXAPRO 10 MG  TABS (ESCITALOPRAM OXALATE) take 1 tabv qd  #30 x 11   Entered and Authorized by:   Judithann Sheen MD   Signed by:   Judithann Sheen MD on 09/20/2009   Method used:   Electronically to        [facility]* (retail)       [address]       [place], [place]  [address]       Ph: [phone]       Fax: [phone]   RxID:   [id number] SLOW-MAG 535 (64 MG) MG TBCR (MAGNESIUM CHLORIDE) Take 1 tablet by mouth twice a day  #60 x 11   Entered  and Authorized by:   Judithann Sheen MD   Signed by:   Judithann Sheen MD on 09/20/2009   Method used:   Electronically to        [facility]* (retail)       [address]       [place], [place]  [address]       Ph: [phone]       Fax: [phone]   RxID:   [id number]    Medication Administration  Injection # 1:    Medication: Depo- Medrol 80mg     Diagnosis: BURSITIS, ACROMIOCLAVICULAR, LEFT (ICD-726.10)    Route: IM    Site: L thigh    Exp Date: 06/2010    Lot #: [id number]    Mfr: teva    Comments: 120 mg given     Patient tolerated injection without complications    Given by: DR Gwenyth Bender STAFFORD  Injection # 2:    Medication: Depo- Medrol 40mg     Diagnosis: BURSITIS, ACROMIOCLAVICULAR, LEFT (ICD-726.10)    Route: IM    Site: RUOQ gluteus    Exp Date: 06/2010    Lot #: [id number]    Mfr: teva    Patient tolerated injection without complications    Given by: Dr Gwenyth Bender STAFFORD  Orders Added: 1)  UA Dipstick w/o Micro (automated)  [81003] 2)  Venipuncture [18299] 3)  T-Vitamin D (25-Hydroxy) [37169-67893] 4)  Depo- Medrol 80mg  [J1040] 5)  Depo- Medrol 40mg  [J1030] 6)  Admin of Therapeutic Inj  intramuscular or subcutaneous [96372] 7)  TLB-Lipid Panel [80061-LIPID] 8)  TLB-BMP (Basic Metabolic Panel-BMET) [80048-METABOL] 9)  TLB-CBC Platelet - w/Differential [85025-CBCD] 10)  TLB-Hepatic/Liver Function Pnl [80076-HEPATIC] 11)  TLB-TSH (Thyroid Stimulating Hormone) [84443-TSH] 12)  Prescription Created Electronically [G8553] 13)  Est. Patient Level IV [81017]   Laboratory Results   Urine Tests    Routine Urinalysis   Color: yellow Appearance: Cloudy Glucose: negative   (Normal Range: Negative) Bilirubin: negative   (Normal Range: Negative) Ketone: negative   (Normal Range: Negative) Spec. Gravity: 1.015   (Normal Range: 1.003-1.035) Blood: negative   (Normal Range: Negative) pH: 6.5   (Normal Range: 5.0-8.0) Protein:  1+   (Normal Range: Negative) Urobilinogen: 0.2   (Normal Range: 0-1) Nitrite: negative   (Normal Range: Negative) Leukocyte Esterace: negative   (Normal Range: Negative)    Comments: Scarlett Portlock CMA  September 20, 2009 3:56 PM

## 2010-10-22 NOTE — Progress Notes (Signed)
Summary: Pt would like to try an ointment with Lidoderm in it  Phone Note Call from Patient Call back at Home Phone [phone]   Caller: Patient Call For: STAFFORD Summary of Call: WANTS A RX FOR A PROBLEM SHE'S BEEN HAVING A LONG TIME Initial call taken by: Barnie Mort,  June 01, 2007 9:50 AM  Follow-up for Phone Call        Itichy arms, a skin problem we've been working on for 18 years.  Dr Scotty Court has decided its a neurological thing. Daughter gave her mother a sample of a Lidoderm Patch with lidocaine in it 5%.  Pt tried it and it helped.  Pt wondering if it comes in an ointment 5%. (Pt has tried the 2.5% ointment and it did not help)  [facility] Follow-up by: Sid Falcon LPN,  June 01, 2007 3:30 PM  Additional Follow-up for Phone Call Additional follow up Details #1::        no does not come in an ointment continie the patch will fax rx [facility] Additional Follow-up by: Judithann Sheen MD,  June 04, 2007 10:19 AM         Appended Document:  Lidocaine 5% Ointment    Phone Note Call from Patient   Caller: Patient Call For: [facility] Summary of Call: Pt called very upset, she was never contacted re: med request, so she went to [facility] on Sat. 9/13 only to find out Dr Scotty Court had ordered Lidoderm Patch.  She did not pick it up because she wants Lidocaine 5% ointment.  I did speak with a Pharmacist at [facility] and they do have Lidocaine 5% Ointment.  Pt wanted problem fixed today.  Per Dr Fabian Sharp, please hold for Dr Scotty Court.  Pt informed and requested a call back from [place] tomorrow. Initial call taken by: Sid Falcon LPN,  June 07, 2007 12:46 PM  Follow-up for Phone Call        lidocaine 5% ointment 35gm tube ref x 0ne year called to [facility]. pt notified. Follow-up by: Sindy Guadeloupe RN,  June 08, 2007 9:22 AM    New/Updated Medications: LIDOCAINE 5 %  OINT (LIDOCAINE) apply as needed-ref x 0ne year    Prescriptions: LIDOCAINE 5 %  OINT (LIDOCAINE) apply as needed-ref x 0ne year  #35gm tube x one year   Entered by:   Sindy Guadeloupe RN   Authorized by:   Judithann Sheen MD   Signed by:   Sindy Guadeloupe RN on 06/08/2007   Method used:   Telephoned to ...       [facility]       [address]       [place], [place]  [address]  [place]       Ph: [phone]       Fax: [phone]   RxID:   [id number]

## 2010-10-22 NOTE — Progress Notes (Signed)
Summary: REFILL simvastatin   Phone Note Refill Request Message from:  Fax from Pharmacy  Refills Requested: Medication #1:  SIMVASTATIN 40 MG TABS 1 by mouth in the evening [facility] PH---[phone]      FAX---[phone]  Initial call taken by: Warnell Forester,  November 06, 2009 10:23 AM  Follow-up for Phone Call        called drug store spoke to [facility] she does have new rx on file with 11 refills for simvastain.  Follow-up by: Pura Spice, RN,  November 06, 2009 10:51 AM

## 2010-10-22 NOTE — Procedures (Signed)
Summary: Colonoscopy / [facility]  Colonoscopy / [facility]   Imported By: Lennie Odor 03/08/2010 16:50:23  _____________________________________________________________________  External Attachment:    Type:   Image     Comment:   External Document

## 2010-10-22 NOTE — Miscellaneous (Signed)
Summary: vaccine  vaccine   Imported By: Kassie Mends 11/10/2007 13:42:36  _____________________________________________________________________  External Attachment:    Type:   Image     Comment:   vaccine

## 2010-10-22 NOTE — Assessment & Plan Note (Signed)
Summary: emp/will fast/ccm   Vital Signs:  Patient Profile:   77 Years Old Female Weight:      152.5 pounds O2 Sat:      93 % Temp:     98 degrees F Pulse rate:   68 / minute BP sitting:   126 / 82  (left arm)  Vitals Entered By: Pura Spice, RN (August 04, 2008 8:56 AM)                 Chief Complaint:  yrly ck up .  History of Present Illness: pt in for yearly evaluation in generalmdoing fine fibromyalgia pain persist concerned about lesions on rt lower leg ant tibis also rt foot swollwn feeels bloated after eating, no diarrhea but constipated  concerned about short term memory loss bone density and mammography 2009 bone density osteopor no other complaints    Current Allergies (reviewed today): ! SULFA ! MACRODANTIN (NITROFURANTOIN MACROCRYSTAL) ! DEMEROL ! CEFTIN ! * HISMANAL ! * MAPROBAMATE ! CODEINE  Past Medical History:    last pap  2006  2008     mamogram  2009     colon           2005     DEXA          2009     EKG   ------DR Jose Persia    FORMER      Review of Systems      See HPI  General      Denies chills, fatigue, fever, loss of appetite, malaise, sleep disorder, sweats, weakness, and weight loss.  Eyes      Denies blurring, discharge, double vision, eye irritation, eye pain, halos, itching, light sensitivity, red eye, vision loss-1 eye, and vision loss-both eyes.  ENT      Denies decreased hearing, difficulty swallowing, ear discharge, earache, hoarseness, nasal congestion, nosebleeds, postnasal drainage, ringing in ears, sinus pressure, and sore throat.  CV      Denies bluish discoloration of lips or nails, chest pain or discomfort, difficulty breathing at night, difficulty breathing while lying down, fainting, fatigue, leg cramps with exertion, lightheadness, near fainting, palpitations, shortness of breath with exertion, swelling of feet, swelling of hands, and weight gain.  GI      Complains of constipation and  indigestion.      bloating, gerd controlled with  nexium  GU      Denies abnormal vaginal bleeding, decreased libido, discharge, dysuria, genital sores, hematuria, incontinence, nocturia, urinary frequency, and urinary hesitancy.  MS      See HPI      Complains of joint pain and joint swelling.  Derm      See HPI      Complains of lesion(s).      rt ant tibia  Neuro      Complains of memory loss.  Psych      Complains of anxiety and depression.      controlled lexapro  Endo      Denies cold intolerance, excessive hunger, excessive thirst, excessive urination, heat intolerance, polyuria, and weight change.   Physical Exam  General:     Well-developed,well-nourished,in no acute distress; alert,appropriate and cooperative throughout examination Head:     Normocephalic and atraumatic without obvious abnormalities. No apparent alopecia or balding. Eyes:     No corneal or conjunctival inflammation noted. EOMI. Perrla. Funduscopic exam benign, without hemorrhages, exudates or papilledema. Vision grossly normal. Ears:  External ear exam shows no significant lesions or deformities.  Otoscopic examination reveals clear canals, tympanic membranes are intact bilaterally without bulging, retraction, inflammation or discharge. Hearing is grossly normal bilaterally. Nose:     External nasal examination shows no deformity or inflammation. Nasal mucosa are pink and moist without lesions or exudates. Mouth:     Oral mucosa and oropharynx without lesions or exudates.  Teeth in good repair. Neck:     No deformities, masses, or tenderness noted. Chest Wall:     No deformities, masses, or tenderness noted. Breasts:     No mass, nodules, thickening, tenderness, bulging, retraction, inflamation, nipple discharge or skin changes noted.   Lungs:     Normal respiratory effort, chest expands symmetrically. Lungs are clear to auscultation, no crackles or wheezes. Heart:     Normal rate and  regular rhythm. S1 and S2 normal without gallop, murmur, click, rub or other extra sounds. Abdomen:     no rebound tenderness, no abdominal hernia, no inguinal hernia, no hepatomegaly, no splenomegaly, and bowel sounds hyperactive.   Rectal:     ne Genitalia:     ne Msk:     swollen anlkle and hyperkeratotic lesions over lower leg Pulses:     R and L carotid,radial,femoral,dorsalis pedis and posterior tibial pulses are full and equal bilaterally Extremities:     No clubbing, cyanosis, edema, or deformity noted with normal full range of motion of all joints.   Neurologic:     No cranial nerve deficits noted. Station and gait are normal. Plantar reflexes are down-going bilaterally. DTRs are symmetrical throughout. Sensory, motor and coordinative functions appear intact. Skin:     hyperkeratotic lesion rt ant tibia Cervical Nodes:     No lymphadenopathy noted Axillary Nodes:     No palpable lymphadenopathy Inguinal Nodes:     No significant adenopathy Psych:     Cognition and judgment appear intact. Alert and cooperative with normal attention span and concentration. No apparent delusions, illusions, hallucinations    Impression & Recommendations:  Problem # 1:  OSTEOPENIA (ICD-733.90) Assessment: Unchanged  Her updated medication list for this problem includes:    Boniva 150 Mg Tabs (Ibandronate sodium) .Marland Kitchen... Dr Phylliss Bob  Orders: T-Vitamin D (25-Hydroxy) [phone])   Problem # 2:  FIBROMYALGIA (ICD-729.1) Assessment: Unchanged  Problem # 3:  GERD (ICD-530.81) Assessment: Improved  Her updated medication list for this problem includes:    Nexium 40 Mg Cpdr (Esomeprazole magnesium) .Marland Kitchen... Dr Phylliss Bob once daily   Problem # 4:  DERMATITIS (ICD-692.9) Assessment: Unchanged  Problem # 5:  BURSITIS, RIGHT SHOULDER (ICD-726.10) Assessment: Improved  Problem # 6:  HYPERLIPIDEMIA (ICD-272.4) Assessment: Unchanged  The following medications were removed from the medication  list:    Lipitor 20 Mg Tabs (Atorvastatin calcium)  Her updated medication list for this problem includes:    Zetia 10 Mg Tabs (Ezetimibe) ..... Once daily    Simvastatin 40 Mg Tabs (Simvastatin) .Marland Kitchen... 1 by mouth in the evening  Orders: Venipuncture (42706) TLB-Lipid Panel (80061-LIPID) TLB-Hepatic/Liver Function Pnl (80076-HEPATIC)   Problem # 7:  HYPOTHYROIDISM (ICD-244.9) Assessment: Improved  Orders: TLB-TSH (Thyroid Stimulating Hormone) (84443-TSH)   Complete Medication List: 1)  Slow-mag 535 (64 Mg) Mg Tbcr (Magnesium chloride) .... Take 1 tablet by mouth twice a day 2)  Lexapro 10 Mg Tabs (Escitalopram oxalate) .... Take 1 tabv qd 3)  Torsemide 10 Mg Tabs (Torsemide) .... Once daily 4)  Nexium 40 Mg Cpdr (Esomeprazole magnesium) .... Dr Phylliss Bob once daily  5)  Nortriptyline Hcl 25 Mg Caps (Nortriptyline hcl) .... Dr Phylliss Bob  1 by mouth in the evening 6)  Bisoprolol Fumarate 5 Mg Tabs (Bisoprolol fumarate) .... Dr Lucas Mallow  1 in the pm 7)  Zetia 10 Mg Tabs (Ezetimibe) .... Once daily 8)  Lunesta 3 Mg Tabs (Eszopiclone) .... Dr Phylliss Bob 9)  Boniva 150 Mg Tabs (Ibandronate sodium) .... Dr Phylliss Bob 10)  Simvastatin 40 Mg Tabs (Simvastatin) .Marland Kitchen.. 1 by mouth in the evening 11)  Gabapentin 300 Mg Caps (Gabapentin) .... Dr Terri Piedra 12)  Hydroxyzine Hcl 10 Mg Tabs (Hydroxyzine hcl) .... Dr Terri Piedra 13)  Premarin 0.45 Mg Tabs (Estrogens conjugated) .Marland Kitchen.. 1 qd  Other Orders: UA Dipstick w/o Micro (automated)  (81003) TLB-BMP (Basic Metabolic Panel-BMET) (80048-METABOL) TLB-CBC Platelet - w/Differential (85025-CBCD)   Patient Instructions: 1)  contiue regular medications 2)  no tx for leg lesions 3)  for edema rt anle take tosemide two times a day  for 1 week   Prescriptions: ZETIA 10 MG  TABS (EZETIMIBE) once daily  #30 x 11   Entered by:   Pura Spice, RN   Authorized by:   Judithann Sheen MD   Signed by:   Pura Spice, RN on 08/08/2008   Method used:   Electronically to         [facility]* (retail)       [address]       [place], [place]  [address]       Ph: [phone]       Fax: [phone]   RxID:   [id number] SIMVASTATIN 40 MG TABS (SIMVASTATIN) 1 by mouth in the evening  #30 x 11   Entered and Authorized by:   Judithann Sheen MD   Signed by:   Judithann Sheen MD on 08/04/2008   Method used:   Electronically to        [facility]* (retail)       [address]       [place], [place]  [id number]       Ph: [phone]       Fax: [phone]   RxID:   [id number] TORSEMIDE 10 MG  TABS (TORSEMIDE) once daily  #30 x 11   Entered and Authorized by:   Judithann Sheen MD   Signed by:   Judithann Sheen MD on 08/04/2008   Method used:   Electronically to        [facility]* (retail)       [address]       [place], [place]  [address]       Ph: [phone]       Fax: [phone]   RxID:   [id number] LEXAPRO 10 MG  TABS (ESCITALOPRAM OXALATE) take 1 tabv qd  #30 x 11   Entered and Authorized by:   Judithann Sheen MD   Signed by:   Judithann Sheen MD on 08/04/2008   Method used:   Electronically to        [facility]* (retail)       [address]       [place], [place]  [address]       Ph: [phone]       Fax: [phone]   RxID:   [id number] PREMARIN 0.45 MG TABS (ESTROGENS CONJUGATED) 1 qd  #30 x 11   Entered and Authorized  by:   Judithann Sheen MD   Signed by:   Judithann Sheen MD on 08/04/2008   Method used:   Electronically to        [facility]* (retail)       [address]       [place], [place]  [address]       Ph: [phone]       Fax: [phone]   RxID:   [id number]  ] Laboratory Results   Urine Tests   Date/Time Reported: August 04, 2008 11:58 AM   Routine Urinalysis   Color: yellow Appearance: Clear Glucose: negative    (Normal Range: Negative) Bilirubin: negative   (Normal Range: Negative) Ketone: negative   (Normal Range: Negative) Spec. Gravity: 1.015   (Normal Range: 1.003-1.035) Blood: trace-intact   (Normal Range: Negative) pH: 7.5   (Normal Range: 5.0-8.0) Protein: negative   (Normal Range: Negative) Urobilinogen: negative   (Normal Range: 0-1) Nitrite: negative   (Normal Range: Negative) Leukocyte Esterace: negative   (Normal Range: Negative)    Comments: Wynona Canes, CMA  August 04, 2008 11:58 AM

## 2010-10-22 NOTE — Progress Notes (Signed)
Summary: Pt called and is still having knee pain.  Phone Note Call from Patient Call back at Home Phone [phone]   Caller: Patient Summary of Call: Pt called and said that she is still having pain in her knee and that Dr. Abner Greenspan said that she would call Dr. Linwood Dibbles office for the pt, and have them sch an appt to get an injection in her knee.   Pls advise. Pt says that  she has to work on Wednesday, but other than that, any day if fine.  Initial call taken by: Lucy Antigua,  May 28, 2010 9:20 AM  Follow-up for Phone Call        Please rerefer her back to Dr Dareen Piano her orthopaedist for knee pain an dpossible steroid injection Follow-up by: Danise Edge MD,  May 28, 2010 1:06 PM

## 2010-10-22 NOTE — Miscellaneous (Signed)
Summary: PAPSMEAR  REPORT   Clinical Lists Changes  Observations: Added new observation of PAP SMEAR: Interpretation/Result:Negative for intraepithelial Lesion or Malignancy.    (07/28/2007 10:27)       Pap Smear  Procedure date:  07/28/2007  Findings:      Interpretation/Result:Negative for intraepithelial Lesion or Malignancy.      Pap Smear  Procedure date:  07/28/2007  Findings:      Interpretation/Result:Negative for intraepithelial Lesion or Malignancy.

## 2010-10-22 NOTE — Progress Notes (Signed)
Summary: xray results.  Phone Note Call from Patient   Caller: Patient Call For: Judithann Sheen MD Summary of Call: Pt is asking for LS Spine results. [phone] Initial call taken by: Lynann Beaver CMA,  February 20, 2010 1:12 PM  Follow-up for Phone Call        per dr Scotty Court call pt and let her know no fx on xr and just arthritic changes pt notified  Follow-up by: Pura Spice, RN,  February 20, 2010 4:30 PM

## 2010-10-22 NOTE — Progress Notes (Signed)
Summary: REFERRAl  to [facility]  Phone Note Call from Patient Call back at [facility] Phone [phone]   Caller: Patient Call For: dr Scotty Court Summary of Call: PT IS REQUESTING A NEW REFERRAL TO [facility]. SHE IS STILL ITCHING AND NOTHING SEEMS TO HELP. Initial call taken by: Warnell Forester,  June 23, 2007 12:37 PM  Follow-up for Phone Call        ok per dr Jaynie Collins aware to sch appt Follow-up by: Pura Spice, RN,  June 23, 2007 1:33 PM  Additional Follow-up for Phone Call Additional follow up Details #1::        Patient will call Para Skeans to set up appt. Additional Follow-up by: Florentina Addison,  June 23, 2007 2:20 PM

## 2010-10-22 NOTE — Assessment & Plan Note (Signed)
Summary: pt fell off ladder last night/back and ankle pain/cjr   Vital Signs:  Patient profile:   77 year old female Menstrual status:  postmenopausal Height:      62.5 inches Weight:      152 pounds Temp:     98.1 degrees F oral Pulse rate:   65 / minute BP sitting:   130 / 70  (right arm)  Vitals Entered By: Kathrynn Speed CMA (Feb 19, 2010 10:58 AM) CC: Larey Seat off Ladder right back pain & left ankle pain   History of Present Illness: This 77 year old white divorced female regular relatively well until 5 PM one day previously when she was on a ladder trimming for every and fell off she fell on her lower right back at all so injured her left ankle with some pain across the foot but not severe. Has past history of fusion of the lumbosacral spine and degenerative arthritis  Current Medications (verified): 1)  Lexapro 10 Mg  Tabs (Escitalopram Oxalate) .... Take 1 Tabv Qd 2)  Nexium 40 Mg  Cpdr (Esomeprazole Magnesium) .Marland Kitchen.. 1 Once Daily For Gerd 3)  Nortriptyline Hcl 25 Mg  Caps (Nortriptyline Hcl) .... Dr Phylliss Bob  1 By Mouth in The Evening 4)  Bisoprolol Fumarate 5 Mg  Tabs (Bisoprolol Fumarate) .... Dr Lucas Mallow  1 in The Pm 5)  Zetia 10 Mg  Tabs (Ezetimibe) .... Once Daily 6)  Lunesta 3 Mg  Tabs (Eszopiclone) .... Dr Phylliss Bob 7)  Boniva 150 Mg  Tabs (Ibandronate Sodium) .... Dr Phylliss Bob 8)  Simvastatin 40 Mg Tabs (Simvastatin) .Marland Kitchen.. 1 By Mouth in The Evening 9)  Gabapentin 300 Mg Caps (Gabapentin) .... Dr Terri Piedra 10)  Hydroxyzine Hcl 10 Mg Tabs (Hydroxyzine Hcl) .... Dr Terri Piedra 11)  Premarin 0.45 Mg Tabs (Estrogens Conjugated) .Marland Kitchen.. 1 Qd 12)  Hyoscyamine Sulfate Cr 0.375 Mg Xr12h-Tab (Hyoscyamine Sulfate) .Marland Kitchen.. 1 Am and Pm For Irritable Bowel 13)  Lomotil 2.5-0.025 Mg Tabs (Diphenoxylate-Atropine) .... 2 Qid Ac, Hs For Diarrhea 14)  Align  Caps (Misc Intestinal Flora Regulat) .Marland Kitchen.. 1 Qd 15)  Torsemide 10 Mg Tabs (Torsemide) .Marland Kitchen.. 1 Qd 16)  Metronidazole 0.75 % Gel (Metronidazole) .... Insert 1/2  Applicator At Bedtime For Vaginitis 17)  Mag-Delay 535 (64 Mg) Mg Cr-Tabs (Magnesium Chloride) .Marland Kitchen.. 1 By Mouth Two Times A Day  Allergies (verified): 1)  ! Sulfa 2)  ! Macrodantin (Nitrofurantoin Macrocrystal) 3)  ! Demerol 4)  ! Ceftin 5)  ! * Hismanal 6)  ! * Maprobamate 7)  ! Codeine  Past History:  Past Medical History: Last updated: 08/09/2009 last pap  2006  2008  mamogram  2009  colon           2005  DEXA          2009  EKG   ------DR Jose Persia    FORMER  Fibromyalgia  Past Surgical History: Last updated: 04/19/2007 Hysterectomy-1981 Laminectomy-1960 Veins Stripped-1970 Back sx-1997 Anterior Cervicle Vertibrea Reconstruction-2003  Social History: Last updated: 04/19/2007 Retired Divorced Former Smoker Alcohol use-yes Drug use-no Regular exercise-no  Risk Factors: Smoking Status: quit (09/20/2009)  Review of Systems      See HPI  The patient denies anorexia, fever, weight loss, weight gain, vision loss, decreased hearing, hoarseness, chest pain, syncope, dyspnea on exertion, peripheral edema, prolonged cough, headaches, hemoptysis, abdominal pain, melena, hematochezia, severe indigestion/heartburn, hematuria, incontinence, genital sores, muscle weakness, suspicious skin lesions, transient blindness, difficulty walking, depression, unusual weight change, abnormal bleeding, enlarged lymph nodes,  angioedema, breast masses, and testicular masses.    Physical Exam  General:  Well-developed,well-nourished,in no acute distress; alert,appropriate and cooperative throughout examination Lungs:  Normal respiratory effort, chest expands symmetrically. Lungs are clear to auscultation, no crackles or wheezes. Heart:  Normal rate and regular rhythm. S1 and S2 normal without gallop, murmur, click, rub or other extra sounds. Msk:  tenderness over the lumbar spine or lumbosacral spine especially over the right sacroiliac joint Some slowing of the right ankle but no  true bony tenderness of the ankle nor dorsal surface of the foot   Impression & Recommendations:  Problem # 1:  LUMBOSACRAL STRAIN, ACUTE (ICD-846.0) Assessment New 2 x-ray lumbosacral spine  Problem # 2:  ANKLE SPRAIN, RIGHT (ICD-845.00) Assessment: New  Her updated medication list for this problem includes:    Tramadol Hcl 50 Mg Tabs (Tramadol hcl) .Marland Kitchen... 1-2 q4h as needed pain  hold until patient calls wrap ankle and foot with an Ace bandage  Problem # 3:  VERTEBRAL FRACTURE (ICD-805.8) Assessment: New  Orders: T-Sacroiliac Joints (72202TC) T-Lumbar Spine 2 Views (72100TC)  Problem # 4:  DEGENERATIVE DISC DISEASE, LUMBOSACRAL SPINE (ICD-722.52) Assessment: Deteriorated  Complete Medication List: 1)  Lexapro 10 Mg Tabs (Escitalopram oxalate) .... Take 1 tabv qd 2)  Nexium 40 Mg Cpdr (Esomeprazole magnesium) .Marland Kitchen.. 1 once daily for gerd 3)  Nortriptyline Hcl 25 Mg Caps (Nortriptyline hcl) .... Dr Phylliss Bob  1 by mouth in the evening 4)  Bisoprolol Fumarate 5 Mg Tabs (Bisoprolol fumarate) .... Dr Lucas Mallow  1 in the pm 5)  Zetia 10 Mg Tabs (Ezetimibe) .... Once daily 6)  Lunesta 3 Mg Tabs (Eszopiclone) .... Dr Phylliss Bob 7)  Boniva 150 Mg Tabs (Ibandronate sodium) .... Dr Phylliss Bob 8)  Simvastatin 40 Mg Tabs (Simvastatin) .Marland Kitchen.. 1 by mouth in the evening 9)  Gabapentin 300 Mg Caps (Gabapentin) .... Dr Terri Piedra 10)  Hydroxyzine Hcl 10 Mg Tabs (Hydroxyzine hcl) .... Dr Terri Piedra 11)  Premarin 0.45 Mg Tabs (Estrogens conjugated) .Marland Kitchen.. 1 qd 12)  Hyoscyamine Sulfate Cr 0.375 Mg Xr12h-tab (Hyoscyamine sulfate) .Marland Kitchen.. 1 am and pm for irritable bowel 13)  Lomotil 2.5-0.025 Mg Tabs (Diphenoxylate-atropine) .... 2 qid ac, hs for diarrhea 14)  Align Caps (Misc intestinal flora regulat) .Marland Kitchen.. 1 qd 15)  Torsemide 10 Mg Tabs (Torsemide) .Marland Kitchen.. 1 qd 16)  Metronidazole 0.75 % Gel (Metronidazole) .... Insert 1/2 applicator at bedtime for vaginitis 17)  Mag-delay 535 (64 Mg) Mg Cr-tabs (Magnesium chloride) .Marland Kitchen.. 1 by mouth two  times a day 18)  Tramadol Hcl 50 Mg Tabs (Tramadol hcl) .Marland Kitchen.. 1-2 q4h as needed pain  hold until patient calls  Patient Instructions: 1)  my impression is that you have had lumbosacral strain however with her previous history of fusion of the spine and degenerative disc disease and with tenderness over the spine we should get an x-ray which L. lower 2)  Left ankle has been sprained but with lack of bony tenderness over the ankle nor the foot I do not feel that we need to x-ray 3)  Pallor his pain persists we will do so Prescriptions: HYOSCYAMINE SULFATE CR 0.375 MG XR12H-TAB (HYOSCYAMINE SULFATE) 1 AM and PM for irritable bowel  #60 x 11   Entered by:   Pura Spice, RN   Authorized by:   Judithann Sheen MD   Signed by:   Pura Spice, RN on 03/06/2010   Method used:   Printed then faxed to .Marland KitchenMarland Kitchen  [facility]* (retail)       [address]       [place], [place]  [id number]       Ph: [phone]       Fax: [phone]   RxID:   [id number] TRAMADOL HCL 50 MG TABS (TRAMADOL HCL) 1-2 q4h as needed pain  HOLD UNTIL PATIENT CALLS  #36 x 3   Entered and Authorized by:   Judithann Sheen MD   Signed by:   Judithann Sheen MD on 02/19/2010   Method used:   Electronically to        [facility]* (retail)       [address]       [place], [place]  [address]       Ph: [phone]       Fax: [phone]   RxID:   [id number]

## 2010-10-22 NOTE — Letter (Signed)
Summary: Results Follow-up Letter  [place] [facility]  [address] [place], [place] [address]   Phone: [phone]  Fax: [phone]    11/27/2008  [address] [place], [place]  [address]  Dear Ms. Sypher,   The following are the results of your recent test(s):  hemocult cards were all negative.   Sincerely,    Dr Gwenyth Bender [place],MD  [place] [facility] [place]

## 2010-10-22 NOTE — Progress Notes (Signed)
Summary: lipitor restart  Medications Added SLOW-MAG 535 (64 MG) MG TBCR (MAGNESIUM CHLORIDE) Take 1 tablet by mouth twice a day       Phone Note Call from Patient Call back at [facility] Phone [phone]   Caller: Patient Call For: STAFFORD Summary of Call: WANTS TO TALK TO NURSE AND GIVE UPDATE ON STOPPING LIPITOR Initial call taken by: Barnie Mort,  May 11, 2007 11:06 AM  Follow-up for Phone Call        Called patient.  Lipitor stopped May or April (Meanwhile emergency with daughter).  Still have muscle pains even off med all this time, so fibromyalgia probably cause.  Need to get back on Lipitor 20mg  one daily.  Have 4wks of samples on hand.   Follow-up by: Rudy Jew, RN,  May 11, 2007 11:50 AM  Additional Follow-up for Phone Call Additional follow up Details #1::        call in rx lipitor 20 mg  once daily and have her ck labs in 3 months for lipids and hepatic Additional Follow-up by: Judithann Sheen MD,  May 11, 2007 5:03 PM    Additional Follow-up for Phone Call Additional follow up Details #2::    Call to [facility].  done Patient advised. Follow-up by: Rudy Jew, RN,  May 11, 2007 5:38 PM  New/Updated Medications: SLOW-MAG 535 (64 MG) MG TBCR (MAGNESIUM CHLORIDE) Take 1 tablet by mouth twice a day

## 2010-10-22 NOTE — Progress Notes (Signed)
Summary: refill  Phone Note From Pharmacy Call back at [phone]   Caller: [facility] Call For: [facility]  Summary of Call: Lyrica 50 mg #90 take 1 capsule 3 times daily last fill 04/08/07 Initial call taken by: Roselle Locus,  July 02, 2007 1:04 PM  Follow-up for Phone Call        Per Dr Clent Ridges, verbal OK to fill X1. Pt informed Follow-up by: Sid Falcon LPN,  July 05, 2007 4:51 PM

## 2010-10-24 NOTE — Progress Notes (Signed)
Summary: premarin refill  Phone Note Refill Request Message from:  Fax from Pharmacy on September 24, 2010 3:11 PM  Refills Requested: Medication #1:  PREMARIN 0.45 MG TABS 1 qd Initial call taken by: Kern Reap CMA (AAMA),  September 24, 2010 3:12 PM    Prescriptions: PREMARIN 0.45 MG TABS (ESTROGENS CONJUGATED) 1 qd  #30 x 11   Entered by:   Kern Reap CMA (AAMA)   Authorized by:   Judithann Sheen MD   Signed by:   Kern Reap CMA Duncan Dull) on 09/24/2010   Method used:   Electronically to        [facility] (retail)       [address]       [place], [place]  [id number]       Ph: [phone]       Fax: [phone]   RxID:   [id number]

## 2010-10-24 NOTE — Progress Notes (Signed)
  Phone Note From Pharmacy   Caller: [facility] Reason for Call: Needs renewal Details for Reason: nexium and simvastatin Initial call taken by: Romualdo Bolk, CMA Duncan Dull),  October 01, 2010 1:55 PM  Follow-up for Phone Call        rx sent to pharmacy Follow-up by: Romualdo Bolk, CMA (AAMA),  October 01, 2010 1:55 PM    Prescriptions: SIMVASTATIN 40 MG TABS (SIMVASTATIN) 1 by mouth in the evening  #30 x 0   Entered by:   Romualdo Bolk, CMA (AAMA)   Authorized by:   Judithann Sheen MD   Signed by:   Romualdo Bolk, CMA (AAMA) on 10/01/2010   Method used:   Electronically to        [facility] (retail)       [address]       [place], [place]  [id number]       Ph: [phone]       Fax: [phone]   RxID:   [id number] NEXIUM 40 MG  CPDR (ESOMEPRAZOLE MAGNESIUM) 1 once daily FOR GERD  #30 x 0   Entered by:   Romualdo Bolk, CMA (AAMA)   Authorized by:   Judithann Sheen MD   Signed by:   Romualdo Bolk, CMA (AAMA) on 10/01/2010   Method used:   Electronically to        [facility] (retail)       [address]       [place], [place]  [id number]       Ph: [phone]       Fax: [phone]   RxID:   [id number]

## 2010-10-24 NOTE — Progress Notes (Signed)
Summary: refill request  Phone Note Refill Request Message from:  Fax from Pharmacy on October 14, 2010 5:31 PM  Refills Requested: Medication #1:  TORSEMIDE 10 MG TABS 1 QD Initial call taken by: Kern Reap CMA Duncan Dull),  October 14, 2010 5:31 PM    Prescriptions: TORSEMIDE 10 MG TABS (TORSEMIDE) 1 QD  #30 x 5   Entered by:   Kern Reap CMA (AAMA)   Authorized by:   Judithann Sheen MD   Signed by:   Kern Reap CMA Duncan Dull) on 10/14/2010   Method used:   Electronically to        [facility] (retail)       [address]       [place], [place]  [id number]       Ph: [phone]       Fax: [phone]   RxID:   [id number]

## 2010-10-30 NOTE — Progress Notes (Signed)
Summary: Pt needs to change Lexapro 10mg  to lower cost med and lower dose  Phone Note Call from Patient Call back at [facility] Phone [phone]   Caller: Patient Summary of Call: Pt recvd a notice from the government, stating that she will have to change her Lexapro 10 to a lower cost med. Also the prescription says to take 1 day, but pt has only been taking half a pill a day, so pt also needs lower dose med. Pls call in to [facility]. Lv detailed message at home #, if pt not available.  Initial call taken by: Lucy Antigua,  October 21, 2010 10:35 AM  Follow-up for Phone Call        we'll change to citalopram 10 mg qd Follow-up by: Judithann Sheen MD,  October 21, 2010 3:28 PM

## 2010-10-31 ENCOUNTER — Other Ambulatory Visit: Payer: Self-pay | Admitting: *Deleted

## 2010-10-31 DIAGNOSIS — K219 Gastro-esophageal reflux disease without esophagitis: Secondary | ICD-10-CM

## 2010-10-31 MED ORDER — ESOMEPRAZOLE MAGNESIUM 40 MG PO CPDR: 40.0000 mg | DELAYED_RELEASE_CAPSULE | Freq: Every day | ORAL | Status: DC

## 2010-11-05 ENCOUNTER — Ambulatory Visit (INDEPENDENT_AMBULATORY_CARE_PROVIDER_SITE_OTHER): Payer: Medicare Other | Admitting: Cardiology

## 2010-11-05 DIAGNOSIS — E7800 Pure hypercholesterolemia, unspecified: Secondary | ICD-10-CM

## 2010-11-05 DIAGNOSIS — R0989 Other specified symptoms and signs involving the circulatory and respiratory systems: Secondary | ICD-10-CM

## 2010-11-05 DIAGNOSIS — Z79899 Other long term (current) drug therapy: Secondary | ICD-10-CM

## 2010-11-07 ENCOUNTER — Telehealth: Payer: Self-pay | Admitting: Family Medicine

## 2010-11-07 MED ORDER — SIMVASTATIN 40 MG PO TABS: 40.0000 mg | ORAL_TABLET | Freq: Every evening | ORAL | Status: DC

## 2010-11-07 NOTE — Telephone Encounter (Signed)
Rx refill simvastatin 40 mg sent to [facility]

## 2010-11-07 NOTE — Telephone Encounter (Signed)
[facility] called back and said that disregard Gabapentin. This med was written by another MD. Pls call all other meds in.

## 2010-11-07 NOTE — Telephone Encounter (Signed)
rx electronically sent in

## 2010-11-07 NOTE — Telephone Encounter (Signed)
[facility] called and said that they sent refill req for pts Simvastatin 40mg , Zetia 10mg , Gabapentin 600mg , on Tuesday. Havent rcvd a response. Pls call in asap.

## 2010-11-08 ENCOUNTER — Encounter: Payer: Self-pay | Admitting: Cardiology

## 2010-11-08 DIAGNOSIS — R0989 Other specified symptoms and signs involving the circulatory and respiratory systems: Secondary | ICD-10-CM | POA: Insufficient documentation

## 2010-11-12 ENCOUNTER — Encounter: Payer: Self-pay | Admitting: Cardiology

## 2010-11-12 ENCOUNTER — Encounter (INDEPENDENT_AMBULATORY_CARE_PROVIDER_SITE_OTHER): Payer: Medicare Other

## 2010-11-12 DIAGNOSIS — R0989 Other specified symptoms and signs involving the circulatory and respiratory systems: Secondary | ICD-10-CM

## 2010-11-12 DIAGNOSIS — I6529 Occlusion and stenosis of unspecified carotid artery: Secondary | ICD-10-CM

## 2010-11-13 NOTE — Miscellaneous (Signed)
Summary: Orders Update  Clinical Lists Changes  Problems: Added new problem of CAROTID BRUIT (ICD-785.9) Orders: Added new Test order of Carotid Duplex (Carotid Duplex) - Signed 

## 2010-11-21 ENCOUNTER — Ambulatory Visit (HOSPITAL_COMMUNITY): Payer: Medicare Other | Attending: Rheumatology

## 2010-11-21 DIAGNOSIS — M810 Age-related osteoporosis without current pathological fracture: Secondary | ICD-10-CM | POA: Insufficient documentation

## 2010-12-03 ENCOUNTER — Telehealth: Payer: Self-pay | Admitting: Family Medicine

## 2010-12-03 NOTE — Telephone Encounter (Signed)
Recently saw Dr Doran Stabler and a doppler showed 2 cysts on her thyroid. Was told to follow up with Dr Scotty Court asap. Refused to see another Dr.

## 2010-12-03 NOTE — Telephone Encounter (Signed)
Pt aware that she will be put on schedule for 12/05/2010 at 11

## 2010-12-05 ENCOUNTER — Ambulatory Visit (INDEPENDENT_AMBULATORY_CARE_PROVIDER_SITE_OTHER): Payer: Medicare Other | Admitting: Family Medicine

## 2010-12-05 ENCOUNTER — Encounter: Payer: Self-pay | Admitting: Family Medicine

## 2010-12-05 VITALS — BP 120/70 | HR 67 | Temp 98.6°F | Ht 63.0 in | Wt 150.0 lb

## 2010-12-05 DIAGNOSIS — F419 Anxiety disorder, unspecified: Secondary | ICD-10-CM

## 2010-12-05 DIAGNOSIS — M129 Arthropathy, unspecified: Secondary | ICD-10-CM

## 2010-12-05 DIAGNOSIS — IMO0001 Reserved for inherently not codable concepts without codable children: Secondary | ICD-10-CM

## 2010-12-05 DIAGNOSIS — F32A Depression, unspecified: Secondary | ICD-10-CM

## 2010-12-05 DIAGNOSIS — E041 Nontoxic single thyroid nodule: Secondary | ICD-10-CM

## 2010-12-05 DIAGNOSIS — K589 Irritable bowel syndrome without diarrhea: Secondary | ICD-10-CM

## 2010-12-05 DIAGNOSIS — F329 Major depressive disorder, single episode, unspecified: Secondary | ICD-10-CM

## 2010-12-05 DIAGNOSIS — M1990 Unspecified osteoarthritis, unspecified site: Secondary | ICD-10-CM

## 2010-12-05 DIAGNOSIS — M797 Fibromyalgia: Secondary | ICD-10-CM

## 2010-12-05 DIAGNOSIS — F341 Dysthymic disorder: Secondary | ICD-10-CM

## 2010-12-05 LAB — TSH: TSH: 3.09 u[IU]/mL (ref 0.35–5.50)

## 2010-12-11 ENCOUNTER — Ambulatory Visit
Admission: RE | Admit: 2010-12-11 | Discharge: 2010-12-11 | Disposition: A | Discharge status: Home / Self Care | Payer: Medicare Other | Source: Ambulatory Visit | Attending: Family Medicine | Admitting: Family Medicine

## 2010-12-11 DIAGNOSIS — E041 Nontoxic single thyroid nodule: Secondary | ICD-10-CM

## 2010-12-26 ENCOUNTER — Other Ambulatory Visit: Payer: Self-pay | Admitting: *Deleted

## 2010-12-26 DIAGNOSIS — I10 Essential (primary) hypertension: Secondary | ICD-10-CM

## 2010-12-26 MED ORDER — BISOPROLOL FUMARATE 5 MG PO TABS: 5.0000 mg | ORAL_TABLET | Freq: Every day | ORAL | Status: DC

## 2011-01-07 ENCOUNTER — Other Ambulatory Visit: Payer: Self-pay | Admitting: Surgery

## 2011-01-07 DIAGNOSIS — E041 Nontoxic single thyroid nodule: Secondary | ICD-10-CM

## 2011-01-12 NOTE — Patient Instructions (Addendum)
Irritable bowel syndrome process with episodes of diarrhea on a recommend continued taking one pill as needed but also Levbid twice daily to help prevent diarrhea Peripheral neuropathy persists we'll continue gabapentin 7 depression persists or continues Celexa 3 hip pain and back pain as well as a cervical radiculopathy continue all trauma plus diclofenac 4 the menopausal or postmenopausal symptoms continue Premarin I will schedule ultrasound of the thyroid to evaluate thyroid nodule

## 2011-01-12 NOTE — Progress Notes (Signed)
  Subjective:    Patient ID: Chelsea Hancock, female    DOB: [birth year], 77 y.o.   MRN: [id number] This 77 year old white divorced female is in today to discuss the fact and on her carotid ultrasound thyroid nodules were seen and she is then to arrange for me to evaluate and do further studies she relates she also had surgery on her left hip 16th of May last year but continues to have some pain and tended to have symptoms or shortness Complains of episodic diarrhea with her durable bowel syndrome and we discussed the treatment cervical radiculopathy persists as well as neuropathy and continue regular treatment This she does not take her estrogen she has postmenopausal   HPI    Review of Systems     Objective:   Physical Exam        Assessment & Plan:

## 2011-01-22 ENCOUNTER — Telehealth: Payer: Self-pay | Admitting: Cardiology

## 2011-01-22 NOTE — Telephone Encounter (Signed)
Jasmine December at [facility] presurgical testing needs most recent cardiac test results, last OV/ekg/cxray.  Patient's surgery is scheduled for 02/03/11

## 2011-01-24 ENCOUNTER — Telehealth: Payer: Self-pay

## 2011-01-24 NOTE — Telephone Encounter (Signed)
Pt called and stated that she will be having surgery on May 16,2012 and pt has lab work at [facility] on May 9,2012 and she would like to have a physical from Dr. Scotty Court before surgery; pt is aware that she has an appointment on May 10 at 10:30

## 2011-01-27 ENCOUNTER — Encounter: Payer: Self-pay | Admitting: Family Medicine

## 2011-01-28 ENCOUNTER — Ambulatory Visit: Payer: Medicare Other | Admitting: Nurse Practitioner

## 2011-01-29 ENCOUNTER — Other Ambulatory Visit: Payer: Self-pay | Admitting: Orthopedic Surgery

## 2011-01-29 ENCOUNTER — Encounter (HOSPITAL_COMMUNITY): Payer: Medicare Other

## 2011-01-29 ENCOUNTER — Ambulatory Visit (HOSPITAL_COMMUNITY)
Admission: RE | Admit: 2011-01-29 | Discharge: 2011-01-29 | Disposition: A | Discharge status: Home / Self Care | Payer: Medicare Other | Source: Ambulatory Visit | Attending: Orthopedic Surgery | Admitting: Orthopedic Surgery

## 2011-01-29 ENCOUNTER — Telehealth: Payer: Self-pay | Admitting: Cardiology

## 2011-01-29 ENCOUNTER — Other Ambulatory Visit (HOSPITAL_COMMUNITY): Payer: Self-pay | Admitting: Orthopedic Surgery

## 2011-01-29 DIAGNOSIS — Z01812 Encounter for preprocedural laboratory examination: Secondary | ICD-10-CM | POA: Insufficient documentation

## 2011-01-29 DIAGNOSIS — Z01818 Encounter for other preprocedural examination: Secondary | ICD-10-CM | POA: Insufficient documentation

## 2011-01-29 DIAGNOSIS — M25552 Pain in left hip: Secondary | ICD-10-CM

## 2011-01-29 DIAGNOSIS — Z01811 Encounter for preprocedural respiratory examination: Secondary | ICD-10-CM

## 2011-01-29 DIAGNOSIS — M25559 Pain in unspecified hip: Secondary | ICD-10-CM | POA: Insufficient documentation

## 2011-01-29 DIAGNOSIS — M4120 Other idiopathic scoliosis, site unspecified: Secondary | ICD-10-CM | POA: Insufficient documentation

## 2011-01-29 DIAGNOSIS — Z981 Arthrodesis status: Secondary | ICD-10-CM | POA: Insufficient documentation

## 2011-01-29 LAB — CBC
HCT: 41 % (ref 36.0–46.0)
Hemoglobin: 13.5 g/dL (ref 12.0–15.0)
MCH: 30.5 pg (ref 26.0–34.0)
MCHC: 32.9 g/dL (ref 30.0–36.0)
MCV: 92.8 fL (ref 78.0–100.0)
Platelets: 274 10*3/uL (ref 150–400)
RBC: 4.42 MIL/uL (ref 3.87–5.11)
RDW: 13 % (ref 11.5–15.5)
WBC: 8.7 10*3/uL (ref 4.0–10.5)

## 2011-01-29 LAB — PROTIME-INR
INR: 0.9 (ref 0.00–1.49)
Prothrombin Time: 12.4 seconds (ref 11.6–15.2)

## 2011-01-29 LAB — URINE MICROSCOPIC-ADD ON

## 2011-01-29 LAB — COMPREHENSIVE METABOLIC PANEL
ALT: 23 U/L (ref 0–35)
AST: 24 U/L (ref 0–37)
Albumin: 3.8 g/dL (ref 3.5–5.2)
Alkaline Phosphatase: 98 U/L (ref 39–117)
BUN: 28 mg/dL — ABNORMAL HIGH (ref 6–23)
CO2: 28 mEq/L (ref 19–32)
Calcium: 9.7 mg/dL (ref 8.4–10.5)
Chloride: 99 mEq/L (ref 96–112)
Creatinine, Ser: 0.79 mg/dL (ref 0.4–1.2)
GFR calc Af Amer: >60 mL/min (ref >60)
GFR calc non Af Amer: >60 mL/min (ref >60)
Glucose, Bld: 94 mg/dL (ref 70–99)
Potassium: 3.8 mEq/L (ref 3.5–5.1)
Sodium: 136 mEq/L (ref 135–145)
Total Bilirubin: 0.2 mg/dL — ABNORMAL LOW (ref 0.3–1.2)
Total Protein: 7.4 g/dL (ref 6.0–8.3)

## 2011-01-29 LAB — URINALYSIS, ROUTINE W REFLEX MICROSCOPIC
Bilirubin Urine: NEGATIVE
Glucose, UA: NEGATIVE mg/dL
Hgb urine dipstick: NEGATIVE
Ketones, ur: NEGATIVE mg/dL
Nitrite: NEGATIVE
Protein, ur: NEGATIVE mg/dL
Specific Gravity, Urine: 1.014 (ref 1.005–1.030)
Urobilinogen, UA: 0.2 mg/dL (ref 0.0–1.0)
pH: 6 (ref 5.0–8.0)

## 2011-01-29 LAB — SURGICAL PCR SCREEN
MRSA, PCR: NEGATIVE
Staphylococcus aureus: NEGATIVE

## 2011-01-29 LAB — APTT: aPTT: 33 seconds (ref 24–37)

## 2011-01-29 NOTE — Telephone Encounter (Signed)
Fax: [phone] [place]

## 2011-01-30 ENCOUNTER — Ambulatory Visit (INDEPENDENT_AMBULATORY_CARE_PROVIDER_SITE_OTHER): Payer: Medicare Other | Admitting: Family Medicine

## 2011-01-30 ENCOUNTER — Ambulatory Visit (INDEPENDENT_AMBULATORY_CARE_PROVIDER_SITE_OTHER)
Admission: RE | Admit: 2011-01-30 | Discharge: 2011-01-30 | Disposition: A | Discharge status: Home / Self Care | Payer: Medicare Other | Source: Ambulatory Visit | Attending: Family Medicine | Admitting: Family Medicine

## 2011-01-30 ENCOUNTER — Other Ambulatory Visit (HOSPITAL_COMMUNITY)
Admission: RE | Admit: 2011-01-30 | Discharge: 2011-01-30 | Disposition: A | Discharge status: Home / Self Care | Payer: Medicare Other | Source: Ambulatory Visit | Attending: Family Medicine | Admitting: Family Medicine

## 2011-01-30 ENCOUNTER — Encounter: Payer: Self-pay | Admitting: Family Medicine

## 2011-01-30 VITALS — BP 128/78 | HR 67 | Temp 98.5°F | Ht 63.0 in | Wt 152.0 lb

## 2011-01-30 DIAGNOSIS — S92909A Unspecified fracture of unspecified foot, initial encounter for closed fracture: Secondary | ICD-10-CM

## 2011-01-30 DIAGNOSIS — Z124 Encounter for screening for malignant neoplasm of cervix: Secondary | ICD-10-CM | POA: Insufficient documentation

## 2011-01-30 DIAGNOSIS — K121 Other forms of stomatitis: Secondary | ICD-10-CM

## 2011-01-30 DIAGNOSIS — Z0000 Encounter for general adult medical examination without abnormal findings: Secondary | ICD-10-CM

## 2011-01-30 DIAGNOSIS — M1610 Unilateral primary osteoarthritis, unspecified hip: Secondary | ICD-10-CM

## 2011-01-30 DIAGNOSIS — K589 Irritable bowel syndrome without diarrhea: Secondary | ICD-10-CM

## 2011-01-30 DIAGNOSIS — B9789 Other viral agents as the cause of diseases classified elsewhere: Secondary | ICD-10-CM

## 2011-01-30 MED ORDER — MAGIC MOUTHWASH: ORAL | Status: DC

## 2011-01-30 MED ORDER — FIRST-DUKES MOUTHWASH MT SUSP: OROMUCOSAL | Status: DC

## 2011-01-30 MED ORDER — VALACYCLOVIR HCL 500 MG PO TABS: 500.0000 mg | ORAL_TABLET | Freq: Three times a day (TID) | ORAL | Status: AC

## 2011-01-31 ENCOUNTER — Other Ambulatory Visit: Payer: Self-pay | Admitting: Family Medicine

## 2011-01-31 ENCOUNTER — Telehealth: Payer: Self-pay | Admitting: *Deleted

## 2011-01-31 NOTE — Telephone Encounter (Signed)
Pt. Needs to know the dosage of Valtrex.  The pharmacist wrote 1/2 tablet daily, and the chart says one tablet daily.  She also needs to know how long to take it, because she goes into the hospital on the 16 the for hip replacement, and needs to know if she should make arrangements to have them give it to her in the hospital.  Needs to know ASAP, please.

## 2011-02-03 ENCOUNTER — Encounter: Payer: Self-pay | Admitting: Family Medicine
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
## 2011-02-05 ENCOUNTER — Inpatient Hospital Stay (HOSPITAL_COMMUNITY)
Admission: RE | Admit: 2011-02-05 | Discharge: 2011-02-10 | DRG: 470 | Disposition: A | Discharge status: Skilled Nursing Facility | Payer: Medicare Other | Source: Ambulatory Visit | Attending: Orthopedic Surgery | Admitting: Orthopedic Surgery

## 2011-02-05 ENCOUNTER — Inpatient Hospital Stay (HOSPITAL_COMMUNITY): Payer: Medicare Other

## 2011-02-05 DIAGNOSIS — T888XXA Other specified complications of surgical and medical care, not elsewhere classified, initial encounter: Secondary | ICD-10-CM | POA: Diagnosis not present

## 2011-02-05 DIAGNOSIS — F99 Mental disorder, not otherwise specified: Secondary | ICD-10-CM | POA: Diagnosis not present

## 2011-02-05 DIAGNOSIS — Y849 Medical procedure, unspecified as the cause of abnormal reaction of the patient, or of later complication, without mention of misadventure at the time of the procedure: Secondary | ICD-10-CM | POA: Diagnosis not present

## 2011-02-05 DIAGNOSIS — E871 Hypo-osmolality and hyponatremia: Secondary | ICD-10-CM | POA: Diagnosis not present

## 2011-02-05 DIAGNOSIS — IMO0001 Reserved for inherently not codable concepts without codable children: Secondary | ICD-10-CM | POA: Diagnosis present

## 2011-02-05 DIAGNOSIS — M1610 Unilateral primary osteoarthritis, unspecified hip: Principal | ICD-10-CM | POA: Diagnosis present

## 2011-02-05 DIAGNOSIS — Z01812 Encounter for preprocedural laboratory examination: Secondary | ICD-10-CM

## 2011-02-05 DIAGNOSIS — E785 Hyperlipidemia, unspecified: Secondary | ICD-10-CM | POA: Diagnosis present

## 2011-02-05 DIAGNOSIS — M169 Osteoarthritis of hip, unspecified: Principal | ICD-10-CM | POA: Diagnosis present

## 2011-02-05 DIAGNOSIS — E876 Hypokalemia: Secondary | ICD-10-CM | POA: Diagnosis not present

## 2011-02-05 DIAGNOSIS — K219 Gastro-esophageal reflux disease without esophagitis: Secondary | ICD-10-CM | POA: Diagnosis present

## 2011-02-05 LAB — TYPE AND SCREEN
ABO/RH(D): O POS
Antibody Screen: NEGATIVE

## 2011-02-05 LAB — ABO/RH: ABO/RH(D): O POS

## 2011-02-06 LAB — CBC
HCT: 31.3 % — ABNORMAL LOW (ref 36.0–46.0)
Hemoglobin: 10 g/dL — ABNORMAL LOW (ref 12.0–15.0)
MCH: 30.1 pg (ref 26.0–34.0)
MCHC: 31.9 g/dL (ref 30.0–36.0)
MCV: 94.3 fL (ref 78.0–100.0)
Platelets: 240 10*3/uL (ref 150–400)
RBC: 3.32 MIL/uL — ABNORMAL LOW (ref 3.87–5.11)
RDW: 13.2 % (ref 11.5–15.5)
WBC: 10.7 10*3/uL — ABNORMAL HIGH (ref 4.0–10.5)

## 2011-02-06 LAB — BASIC METABOLIC PANEL
BUN: 7 mg/dL (ref 6–23)
CO2: 24 mEq/L (ref 19–32)
Calcium: 7.2 mg/dL — ABNORMAL LOW (ref 8.4–10.5)
Chloride: 103 mEq/L (ref 96–112)
Creatinine, Ser: <0.47 mg/dL (ref 0.4–1.2)
Glucose, Bld: 140 mg/dL — ABNORMAL HIGH (ref 70–99)
Potassium: 3.4 mEq/L — ABNORMAL LOW (ref 3.5–5.1)
Sodium: 133 mEq/L — ABNORMAL LOW (ref 135–145)

## 2011-02-06 NOTE — H&P (Addendum)
Chelsea Hancock, Chelsea Hancock        ACCOUNT NO.:  [id number]  MEDICAL RECORD NO.:  [id number]          PATIENT TYPE:  I  LOCATION:  0004                          FACILITY:  [facility]  PHYSICIAN:  Ollen Gross, M.D.    DATE OF BIRTH:  Nov 23, 1933  DATE OF ADMISSION:  02/05/2011 DATE OF DISCHARGE:                             HISTORY & PHYSICAL   CHIEF COMPLAINT:  Left hip pain.  BRIEF HISTORY:  Chelsea Hancock came in to see Dr. Lequita Halt regarding worsening left hip pain back in February.  She states that she has had the problem for over a year.  She does not recall any specific injury, but she states that the pain continues to worsen.  It is now painful at all times, both with activity and with rest.  It is limiting what she is able to do and she now presents for a left total-hip arthroplasty.  CURRENT MEDICATIONS: 1. Calcium. 2. Magnesium. 3. Premarin. 4. Citalopram. 5. Torsemide. 6. Nexium. 7. Nortriptyline. 8. Bisoprolol fumarate. 9. Simvastatin. 10.Zetia. 11.Lunesta. 12.Reclast. 13.Gabapentin. 14.Hydroxyzine. 15.Hyoscyamine. 16.Align.  ALLERGIES: 1. ALENDRONATE SODIUM.  This causes chest pain. 2. SULFA causes hives and swelling. 3. MACRODANTIN also causes hives and swelling. 4. DEMEROL causes nausea and vomiting. 5. CEFTIN. 6. HISMANAL. 7. MEPROBAMATE. 8. CODEINE causes nausea and vomiting; however, the patient states     that she does fine with Percocet and Vicodin.  PAST MEDICAL HISTORY: 1. Cataracts. 2. Heart disease. 3. Hyperlipidemia. 4. Heart arrhythmia. 5. Varicose veins. 6. Reflux disease. 7. Irritable bowel syndrome. 8. Diverticulosis. 9. Slight urinary incontinence. 10.Fibromyalgia. 11.Degenerative disk disease. 12.Bursitis. 13.Menopause.  PAST SURGICAL HISTORY: 1. Hysterectomy. 2. Laminectomy. 3. Stripping of varicose veins in the lower extremities. 4. Lumbar surgery. 5. Anterior cervical decompression and fusion x2.  SOCIAL HISTORY:  The  patient is divorced.  She works as a Network engineer.  She admits past use of tobacco products.  She has 2 glasses of wine each night with dinner.  She has 2 children.  She lives alone.  She plans to go to a skilled nursing facility following her hospital stay.  REVIEW OF SYSTEMS:  GENERAL:  Negative for fever, chills or weight change.  HEENT/NEUROLOGIC:  Positive for footdrop on the right and numbness and tingling in her hands, both hands.  This was secondary to C- spine surgery.  She also has balance problems.  DERMATOLOGIC:  Negative for rash or lesion.  RESPIRATORY:  Negative for shortness of breath at rest or with exertion.  CARDIOVASCULAR:  Negative for chest pain. GASTROINTESTINAL:  Negative for nausea, vomiting, or diarrhea. GENITOURINARY:  Negative for hematuria or dysuria.  MUSCULOSKELETAL: Positive for back pain, morning stiffness and muscular weakness.  PHYSICAL EXAMINATION:  VITAL SIGNS:  Pulse 76, respiration 18, and blood pressure 122/70 in the left arm. GENERAL:  Chelsea Hancock is alert and oriented x3.  She is a pleasant 77- year-old female with a stated height of 5 feet 3 inches and a stated weight of 148 pounds.  She is accompanied today by her daughter.  She is in no apparent distress. HEENT:  Normocephalic, atraumatic.  Extraocular movements intact. NECK:  Supple.  Full range  of motion without lymphadenopathy. CHEST:  Lungs are clear to auscultation bilaterally without wheezes, rhonchi or rales. HEART:  Regular rate and rhythm without murmur.  S1-S2 sounds are appreciated. ABDOMEN:  Bowel sounds present in all 4 quadrants.  Abdomen is soft and nontender to palpation. EXTREMITIES:  Left hip flexion to 90.  No internal rotation.  About 30 degrees of external rotation and 40 degrees of abduction. SKIN:  Unremarkable. NEUROLOGIC:  Intact. PERIPHERAL VASCULAR:  Carotid pulses 2+ bilaterally without bruit.  RADIOGRAPHS:  AP and lateral views of the left hip reveal  advanced end- stage arthritis with cystic change.  IMPRESSION:  End-stage arthritis of the left hip.  PLAN:  Left total-hip arthroplasty to be performed by Dr. Lequita Halt.     Rozell Searing, PAC   ______________________________ Ollen Gross, M.D.    LD/MEDQ  D:  02/05/2011  T:  02/05/2011  Job:  [id number]  Electronically Signed by Rozell Searing  on 02/06/2011 10:43:54 AM Electronically Signed by Ollen Gross M.D. on 02/12/2011 12:55:16 PM

## 2011-02-07 LAB — CBC
HCT: 30 % — ABNORMAL LOW (ref 36.0–46.0)
Hemoglobin: 10.1 g/dL — ABNORMAL LOW (ref 12.0–15.0)
MCH: 31.2 pg (ref 26.0–34.0)
MCHC: 33.7 g/dL (ref 30.0–36.0)
MCV: 92.6 fL (ref 78.0–100.0)
Platelets: 248 10*3/uL (ref 150–400)
RBC: 3.24 MIL/uL — ABNORMAL LOW (ref 3.87–5.11)
RDW: 13.1 % (ref 11.5–15.5)
WBC: 11.2 10*3/uL — ABNORMAL HIGH (ref 4.0–10.5)

## 2011-02-07 LAB — BASIC METABOLIC PANEL
BUN: 7 mg/dL (ref 6–23)
CO2: 23 mEq/L (ref 19–32)
Calcium: 7.7 mg/dL — ABNORMAL LOW (ref 8.4–10.5)
Chloride: 102 mEq/L (ref 96–112)
Creatinine, Ser: 0.5 mg/dL (ref 0.4–1.2)
GFR calc Af Amer: >60 mL/min (ref >60)
GFR calc non Af Amer: >60 mL/min (ref >60)
Glucose, Bld: 125 mg/dL — ABNORMAL HIGH (ref 70–99)
Potassium: 4 mEq/L (ref 3.5–5.1)
Sodium: 133 mEq/L — ABNORMAL LOW (ref 135–145)

## 2011-02-07 NOTE — H&P (Signed)
NAMECURTISTINE, Chelsea Hancock                    ACCOUNT NO.:  [id number]   MEDICAL RECORD NO.:  [id number]                   PATIENT TYPE:  INP   LOCATION:  2899                                 FACILITY:  [facility]   PHYSICIAN:  Stefani Dama, M.D.               DATE OF BIRTH:  01-May-1934   DATE OF ADMISSION:  01/15/2004  DATE OF DISCHARGE:                                HISTORY & PHYSICAL   ADMITTING DIAGNOSIS:  Cervical spondylosis with myelopathy C5-C6, status  post anterior cervical diskectomy and arthrodesis C3-C4 in August of 2003.   HISTORY OF PRESENT ILLNESS:  [name] is a 77 year old individual who  has had significant difficulty with myelopathic weakness in her upper  extremities and also in her lower extremities in the past.  She has had a  previous anterior decompression and arthrodesis in 2003; however, she seems  to have been deteriorating over the past month or more with strength loss in  her left upper extremity primarily.  She had had an MRI in October of 2004  that demonstrated severe spondylitic stenosis at C5-C6 and she was advised  regarding surgical decompression there.  It was also noted that she had  spondylitic disease at C4-5 and at C6-7.  However, the most acute process  appears at the C5-C6 level.  She is now being admitted for surgical  decompression.   PAST MEDICAL HISTORY:  Her past medical history reveals that she has had  previous difficulty with lumbar spondylosis and has a right footdrop ever  since decompression at the L4-L5 level some 10 years ago.  She has had  difficulty with a skin condition that caused a rash for several years now,  and this has been diagnosed as a brachial-radial pruritus.  She also gives a  history of previous fibromyalgia.   CURRENT MEDICATIONS:  1. Premarin 0.625 mg a day.  2. Lexapro 10 mg 1/2 tablet a day.  3. Lasix 10 mg a day.  4. Nexium 40 mg a day.  5. Nortriptyline 25 mg a day.  6. __________ 5 mg a day.  7. Lipitor 10 mg a day.  8. Diclofenac 75 mg a day.  9. Benadryl and betamethasone 0.125 topical cream b.i.d.   ALLERGIES:  She notes allergies to SULFA which causes hives and MACRODANTIN  and MEPROBAMATE.   SOCIAL HISTORY:  Reveals that she is single.  She is retired; however, she  does do some work on her own to keep herself busy involving the [employer].   PHYSICAL EXAMINATION:  GENERAL:  She is an alert, oriented, cooperative  individual in no overt distress.  VITAL SIGNS:  Blood pressure is 128/74, heart rate 66, respirations 18.  NECK:  Reveals range of motion turning 45 degrees to the left and to the  right, she extends and flexes about 50% of normal.  Axial compression does  not reproduce any pain.  No masses are palpable in the  neck and no bruits  are heard.  There is a well-healed scar on the left side of the anterior  superior aspect of the neck.  NEUROLOGIC:  Motor strength in the upper extremities reveals the deltoids,  biceps, triceps, grips and intrinsics have good strength, tone and bulk to  confrontational testing.  Deep tendon reflexes are absent in the biceps and  the triceps, trace in the brachioradialis, trace in the patellae, and absent  in the Achilles bilaterally.  There is a footdrop noted on the left lower  extremity.  Her sensation is intact distally throughout the upper and lower  extremities.  Cranial nerve examination reveals her pupils are 4 mm, brisk,  and reactive to light and accommodation.  Extraocular movements are full.  Face is symmetric to grimace, tongue and uvula are in the midline.  Sclerae  and conjunctivae are clear.  EXTREMITIES:  Reveal no cyanosis, clubbing or edema.  LUNGS:  Clear to auscultation.  HEART:  Regular rate and rhythm.  ABDOMEN:  Soft.  Bowel sounds are positive.  No masses are palpable.   IMPRESSION:  The patient has evidence of spondylitic myelopathy at the level  of C5-C6 from previous MRI.  She is now being  admitted to undergo surgical  decompression at the level of C5-C6 via an anterior procedure.                                                Stefani Dama, M.D.    Merla Riches  D:  01/15/2004  T:  01/15/2004  Job:  [id number]

## 2011-02-07 NOTE — Op Note (Signed)
NAMELINLEY, MOSKAL        ACCOUNT NO.:  [id number]   MEDICAL RECORD NO.:  [id number]          PATIENT TYPE:  AMB   LOCATION:  DSC                          FACILITY:  [facility]   PHYSICIAN:  Tennis Must Meyerdierks, M.D.DATE OF BIRTH:  04/21/1934   DATE OF PROCEDURE:  07/03/2004  DATE OF DISCHARGE:                                 OPERATIVE REPORT   PREOPERATIVE DIAGNOSIS:  Schwannoma ulnar nerve, left arm.   POSTOPERATIVE DIAGNOSIS:  Schwannoma ulnar nerve, left arm.   PROCEDURE:  Excision of schwannoma ulnar nerve, left arm.   SURGEON:  Lowell Bouton, M.D.   ANESTHESIA:  General.   OPERATIVE FINDINGS:  The patient had a mass that was in the ulnar nerve and  measured about 3 cm in diameter.  It was just proximal to the elbow.   PROCEDURE:  Under general anesthesia with a tourniquet on the left arm, the  left arm was prepped and draped in the usual fashion.  After exsanguinating  the limb, the tourniquet was inflated to 250 mmHg.  A longitudinal incision  was made over the medial side of the distal arm.  Sharp dissection was  carried through the subcutaneous tissues.  Bleeding points were coagulated.  Blunt dissection was carried down to the mass which was easily identified in  the ulnar nerve.  Normal nerve was identified proximal and distal.  The  microscope was then brought in and using micro-instruments, the tumor was  bluntly dissected out from the nerve.  The nerve fascicles were quite  stretched but intact.  After completely excising the tumor, the nerve was  then examined and was found to have the majority of it intact with all the  fascicles quite stretched.  The wound was then copiously irrigated with  saline.  The subcutaneous tissues were closed with 4-0 Vicryl over a drain  and the skin was closed with a 3-0 subcuticular Prolene.  Steri-Strips were  applied.  0.5% Marcaine was injected in the skin for pain control.  The  patient was placed in a  sterile dressing and posterior elbow splint.  She  patient tolerated the procedure well and went to the recovery room awake,  stable, in good condition.      EMM/MEDQ  D:  07/03/2004  T:  07/03/2004  Job:  [id number]   cc:   Ellin Saba., M.D.  [address] [place]  [place] [address]  Fax: [phone]

## 2011-02-07 NOTE — Op Note (Signed)
Chelsea Hancock, Chelsea Hancock                    ACCOUNT NO.:  [id number]   MEDICAL RECORD NO.:  [id number]                   PATIENT TYPE:  INP   LOCATION:  3030                                 FACILITY:  [facility]   PHYSICIAN:  Stefani Dama, M.D.               DATE OF BIRTH:  November 01, 1933   DATE OF PROCEDURE:  01/15/2004  DATE OF DISCHARGE:                                 OPERATIVE REPORT   PREOPERATIVE DIAGNOSIS:  Cervical spondylosis with myelopathy C5-C6.   POSTOPERATIVE DIAGNOSIS:  Cervical spondylosis with myelopathy C5-C6.   PROCEDURE:  Anterior cervical decompression C5-C6, arthrodesis with  structural allograft, Alphatek plate fixation.   SURGEON:  Stefani Dama, M.D.   FIRST ASSISTANT:  Cristi Loron, M.D.   ANESTHESIA:  General endotracheal anesthesia.   INDICATIONS FOR PROCEDURE:  The patient is a 77 year old individual who has  had significant neck and shoulder weakness on the left side particularly  with weakness of the triceps and biceps region.  She has had previous  cervical spondylitic myelopathy and underwent a decompression of C3-C4.  Because of progressive quadriparesis, she has felt the situation worsening  now and had been seen in October 2004 where an MRI had demonstrated severe  spondylitic myelopathy at C5-C6.  She was advised regarding surgery then but  decided to put things off as she was feeling fairly well.  Over the past  month or so, she has been developing increasing weakness and is now taken to  the operating room to undergo surgical decompression at the level of C5-C6.   PROCEDURE:  The patient was brought to the operating room and placed on the  table in supine position.  After the smooth induction of general  endotracheal anesthesia, she was placed in 5 pounds of halter traction.  The  neck was shaved, prepped with DuraPrep, and draped in a sterile fashion.  A  transverse incision was made in the left side of the neck and this was  carried down to the platysma.  A plane between the sternocleidomastoid  muscles and the strap muscles was dissected bluntly until the prevertebral  space was reached.  The first identifiable disc space was noted to be that  of C5-C6 on localizing radiograph.  Soft tissues were then dissected free  from the ventral aspect of the vertebral region and there was noted to be  substantial amount of scar tissue along each of the longus colli muscles in  this area.  The disc space was then opened more fully and the disc space was  then entered.  There was very little but very desiccated disc material  within the disc space.  The high speed air drill and 2.3 mm dissecting tool  was required to open up the disc space further from side to side until the  region of the posterior longitudinal ligament was reached.  The interspace  thus being entered in this fashion, a self-retaining spreader was placed  into the wound and dissection was carried out first to the left side and  then over to the right side.  This allowed for deeper placement of the disc  spreader and then the region of the posterior longitudinal ligament was  reached very carefully.  A 2 mm and 1 mm Kerrison punch were used to take up  the redundant ligament which was also noted to be calcified.  This allowed  for further opening of the disc space and then drilling of rather prominent  osteophyte from the inferior margin of the body of C5.  This dissection was  carried out to the sides where a large uncinate spur hypertrophy was found  and this was drilled down, again with a 2.3 mm dissecting tool.  A wide  decompression was obtained over each of the exiting C6 nerve roots.  Once  the space was cleared, hemostasis from the epidural veins was obtained with  the bipolar cautery and some pledgets of Gelfoam soaked with thrombin which  were later irrigated away.  A 6 mm standard size Alphatek graft was placed  into the interspace after being  packed with the patient's own bone that was  harvested from the uncinate dissection.  The traction was released and then  an 18 mm standard size Alphatek plate was affixed with four locking 4 by 14  mm screws.  On the superior aspect on the left side, a variable angle screw  was placed up into the body of C5.  There was still noted to be a  significant kyphos at the C4-C5 level with the plate above it angling  anteriorly.  The interspace at C5-C6, however, was secured nicely with the  plate.  Hemostasis in the soft tissues was obtained meticulously.  A 3-0  Vicryl was used to close the subcuticular tissues and the platysma and the  subcuticular skin.  The patient tolerated the procedure well.                                               Stefani Dama, M.D.    Merla Riches  D:  01/15/2004  T:  01/15/2004  Job:  [id number]

## 2011-02-08 LAB — CBC
HCT: 29.8 % — ABNORMAL LOW (ref 36.0–46.0)
Hemoglobin: 9.9 g/dL — ABNORMAL LOW (ref 12.0–15.0)
MCH: 30.4 pg (ref 26.0–34.0)
MCHC: 33.2 g/dL (ref 30.0–36.0)
MCV: 91.4 fL (ref 78.0–100.0)
Platelets: 267 10*3/uL (ref 150–400)
RBC: 3.26 MIL/uL — ABNORMAL LOW (ref 3.87–5.11)
RDW: 12.9 % (ref 11.5–15.5)
WBC: 11.8 10*3/uL — ABNORMAL HIGH (ref 4.0–10.5)

## 2011-02-08 LAB — BASIC METABOLIC PANEL
BUN: 9 mg/dL (ref 6–23)
CO2: 26 mEq/L (ref 19–32)
Calcium: 8.5 mg/dL (ref 8.4–10.5)
Chloride: 95 mEq/L — ABNORMAL LOW (ref 96–112)
Creatinine, Ser: 0.52 mg/dL (ref 0.4–1.2)
GFR calc Af Amer: >60 mL/min (ref >60)
GFR calc non Af Amer: >60 mL/min (ref >60)
Glucose, Bld: 141 mg/dL — ABNORMAL HIGH (ref 70–99)
Potassium: 3.7 mEq/L (ref 3.5–5.1)
Sodium: 130 mEq/L — ABNORMAL LOW (ref 135–145)

## 2011-02-12 NOTE — Op Note (Signed)
Chelsea Hancock, Chelsea Hancock        ACCOUNT NO.:  [id number]  MEDICAL RECORD NO.:  [id number]           PATIENT TYPE:  I  LOCATION:  0004                         FACILITY:  [facility]  PHYSICIAN:  Ollen Gross, M.D.    DATE OF BIRTH:  1933-12-27  DATE OF PROCEDURE:  02/05/2011 DATE OF DISCHARGE:                              OPERATIVE REPORT   PREOPERATIVE DIAGNOSIS:  Osteoarthritis, left hip.  POSTOPERATIVE DIAGNOSIS:  Osteoarthritis, left hip.  PROCEDURE:  Left total hip arthroplasty.  SURGEON:  Ollen Gross, M.D.  ASSISTANT:  Alexzandrew L. Perkins, P.A.C.  ANESTHESIA:  General.  ESTIMATED BLOOD LOSS:  300.  DRAIN:  Hemovac x1.  COMPLICATIONS:  None.  CONDITION:  Stable to recovery room.  BRIEF CLINICAL NOTE:  Chelsea Hancock is a 77 year old female with advanced arthritis of the left hip with progressively worsening pain and dysfunction.  She has failed nonoperative management and presents for total hip arthroplasty.  PROCEDURE IN DETAIL:  After successful administration of general anesthetic, the patient was placed in the right lateral decubitus position with the left side up and held with the hip positioner.  The left lower extremity was isolated from her perineum with plastic drapes and prepped and draped in the usual sterile fashion.  Short posterolateral incision was made with a #10 blade through subcutaneous tissue at the level of the fascia lata was incised in line with the skin incision.  Sciatic nerve was palpated and protected and short rotators and capsule were isolated off the femur.  The hip was dislocated and the center of femoral head was marked.  A trial prosthesis was placed such that the center of the trial head corresponds to the center of native femoral head.  Osteotomy line was marked on the femoral neck and osteotomy made with an oscillating saw.  Femoral head was removed and the femur was isolated for bone exposure, very proximal  femur.  Femoral retractors were placed and a starter reamer was performed.  The canal was thoroughly irrigated with saline to remove fatty contents. Axial reaming was performed to 13.5 mm.  Proximal reaming was performed to 18 D and the sleeve machined to a small.  A 18 D small trial sleeve was placed.  Femur was retracted anteriorly to gain acetabular exposure.  Acetabular retractors were placed and labrum and osteophytes removed.  Reaming was performed up to 51 mm.  A 52-mm pinnacle acetabular shell was placed in anatomic position and transfixed with 2 dome screws.  Apex hole eliminator was placed and then the 32 mm neutral +4 marathon liner was placed.  Trial femur was placed as an 18 x 13 with a 36 plus eight neck matching native anteversion.  A 32.0 trial head was placed.  The hip was reduced with outstanding stability.  There was full extension, full external rotation, 70 degrees flexion, 40 degrees adduction, 90 degrees internal rotation and 90 degrees of flexion and 70 degrees of internal rotation.  By placing the left leg on top of the right, it feels as though the leg lengths were equal.  The hip was then dislocated and trials were removed.  The permanent 18D small sleeve was placed,  18 x 13 stem and 36 plus eight neck matching native anteversion.  A 32.0 ceramic head was placed and the hip reduced with the same stability parameters. The wound was copiously irrigated with saline solution and then the short rotators and capsule reattached to the femur through drill holes with Ethibond suture.  Fascia lata was closed over Hemovac drain with interrupted #1 Vicryl, subcu closed with #1 and #2-0 Vicryl and subcuticular running 4-0 Monocryl.  The incision was then cleaned and dried and Steri-Strips and bulky sterile dressing were applied.  She was placed into a knee immobilizer, awakened and transported to recovery in stable condition.     Ollen Gross, M.D.     FA/MEDQ   D:  02/05/2011  T:  02/05/2011  Job:  [id number]  Electronically Signed by Ollen Gross M.D. on 02/12/2011 12:55:12 PM

## 2011-02-12 NOTE — Discharge Summary (Signed)
  NAMECRIMSON, Chelsea Hancock        ACCOUNT NO.:  [id number]  MEDICAL RECORD NO.:  [id number]           PATIENT TYPE:  I  LOCATION:  1617                         FACILITY:  [facility]  PHYSICIAN:  Ollen Gross, M.D.    DATE OF BIRTH:  Sep 06, 1934  DATE OF ADMISSION:  02/05/2011 DATE OF DISCHARGE:  02/10/2011                        DISCHARGE SUMMARY - REFERRING   ADDENDUM:  ADMITTING DIAGNOSIS:  Please see all previously dictated summary.  DISCHARGE DIAGNOSIS:  Please see all previously dictated summary  PROCEDURE:  Left total hip done on Feb 05, 2011.  ADDENDUM SUMMARY:  The patient was originally set up to go to [facility] on Saturday the 19th, however, when the patient was seen on rounds was noted to have markedly swollen right arm.  It appears this was the arm where she had had a previous IV and also where they were doing blood draws.  It looks like she had some bleeding and some infiltration and also noted to have some little bit of confusion that night, possibly some sundowning.  Her discharge was held.  She was kept over the weekend.  She was seen on Saturday and Sunday by weekend coverage. Throughout the weekend, the arm did improve.  She continued to work with therapy and monitored closely.  She was noted have a hemoglobin on Saturday.  Hemoglobin was 9.9, it was stable.  Her white count was stable, it was 11.2 on Friday and 11.8 on Saturday.  Her sodium was a little low at 133.  It was noted be 130 and remaining BMET was within normal limits.  She did well through the weekend though and her arm was reassessed on Monday by Dr. Lequita Halt, she had good sensation.  She did have some soft tissue swelling but her pulses were noted to be intact. Her sensation was intact throughout the right upper extremity and subjectively her pain.  She stated her pain had improved over the weekend with elevation.  She was seen back on rounds on Monday morning, doing well and she was discharged  home at that time.  DISCHARGE/PLAN:  The patient is to be transferred to [facility] today Feb 10, 2011.  Remaining discharge plan, please see previous discharge summary.  No changes.  CONDITION ON DISCHARGE:  Improving.     Alexzandrew L. Julien Girt, P.A.C.   ______________________________ Ollen Gross, M.D.    ALP/MEDQ  D:  02/10/2011  T:  02/10/2011  Job:  [id number]  Electronically Signed by Patrica Duel P.A.C. on 02/10/2011 11:07:46 AM Electronically Signed by Ollen Gross M.D. on 02/12/2011 12:55:24 PM

## 2011-02-12 NOTE — Discharge Summary (Signed)
Chelsea Hancock, Chelsea Hancock        ACCOUNT NO.:  [id number]  MEDICAL RECORD NO.:  [id number]           PATIENT TYPE:  I  LOCATION:  1617                         FACILITY:  [facility]  PHYSICIAN:  Ollen Gross, M.D.    DATE OF BIRTH:  Jan 09, 1934  DATE OF ADMISSION:  02/05/2011 DATE OF DISCHARGE:  02/08/2011                              DISCHARGE SUMMARY   ADMITTING DIAGNOSES: 1. Osteoarthritis, left hip. 2. Cataracts. 3. Heart disease. 4. Hyperlipidemia. 5. Cardiac arrhythmia. 6. Varicose veins. 7. Reflux disease. 8. Irritable bowel syndrome. 9. Diverticulosis. 10.Slight urinary incontinence. 11.Fibromyalgia. 12.Degenerative disk disease. 13.History of bursitis. 14.Postmenopausal.  DISCHARGE DIAGNOSES: 1. Osteoarthritis, left hip, status post left total hip replacement     arthroplasty. 2. Postop hyponatremia, stable. 3. Postop hypokalemia, improving. 4. Cataracts. 5. Heart disease. 6. Hyperlipidemia.7. Cardiac arrhythmia. 8. Varicose veins. 9. Reflux disease. 10.Irritable bowel syndrome. 11.Diverticulosis. 12.Slight urinary incontinence. 13.Fibromyalgia. 14.Degenerative disk disease. 15.History of bursitis. 16.Postmenopausal.  PROCEDURE:  Feb 05, 2011 left total hip.  SURGEON:  Ollen Gross, M.D.  ASSISTANT:  Alexzandrew L. Perkins, P.A.C.  SURGERY PERFORMED:  Under general anesthesia with only approximately 300 mL blood loss.  CONSULTS:  None.  BRIEF HISTORY:  The patient is a 77 year old female with known end-stage arthritis of left hip, been refractory to nonoperative management, now presents for total hip arthroplasty.  LABORATORY DATA:  Preop CBC showed a preop hemoglobin of 13.5, hematocrit 41.0, white cell count 8.7, platelets 274,000.  PT/INR preop 12.4 and 0.9 with PTT of 33.  Chem panel on admission, slightly elevated BUN of 28.  Remaining Chem panel all within normal limits with exception of low total bili of 0.2.  Preop UA, trace leukocytes,  few squamous, 0 to 2 white cells, few bacteria, otherwise negative.  Blood group type O positive.  Nasal swabs were negative for Staphylococcus aureus, negative for MRSA.  Serial CBCs were followed.  Hemoglobin on postop day #1 was 10.0 and on postop day #2, it stabilized and was still at 10.1 with hematocrit of 30.0.  Please note her CBC is pending on postop day #3 prior to discharge.  Serial BMETs were followed for 48 hours.  Sodium dropped from 136 to 133, stabilized at 133 on postop day #2.  Potassium dropped from 3.8 to 3.4 on postop day #1 but by postop day #2, it was already back to 4.0.  Glucose went up 94 to 140, back down to 125. Remaining electrolytes remained within normal limits.  Please note there was a BMET pending on postop day #3 prior to discharge.  HOSPITAL COURSE:  The patient was admitted to [facility], taken to OR, underwent above-stated procedure without complication.  The patient tolerated the procedure well, later transferred to the recovery room on orthopedic floor, started on p.o. and IV analgesic for pain control following surgery, doing pretty well on the morning of day 1, given 24 hours postop IV antibiotics.  She was started on Xarelto for DVT prophylaxis.  She is noted to have a  low urinary output on day 1, so she had to have a little bit of extra fluids.  Potassium is a little low, sodium is  a little low on the first day, so she was placed on potassium supplements and we rechecked her sodium level.  It is probably dilutional since she was retaining fluids and did not have much output but she needed extra fluids to increase urine output initially, started getting up out of bed on day 1.  She knew she wanted to look into skilled facility, so we got social work involved.  She got up, walked about 20 feet.  By day 2, she was doing much better.  Sodium is stabilized.  Potassium had improved.  Her output had picked up. Dressing was changed.  Incision  looked good and felt she was progressing well enough and she would be a good candidate for skilled rehab.  We are looking into going over to [facility] over the weekend.  If there was a bed available on Saturday, May 19, she would be transferred at that time, working on pending transfer tomorrow at time of dictation.  DISCHARGE PLAN: 1. Tentative plan is to transfer the patient over to [facility] on     Feb 08, 2011. 2. Discharge diagnoses, please see above. 3. Discharge medications, current medications at time of transfer     include Magic Mouthwash 10 mL p.o. t.i.d. p.r.n., Xarelto 10 mg     daily for 3 weeks and then discontinue Xarelto, Colace 100 mg p.o.     b.i.d., Lunesta 3 mg p.o. q.h.s. p.r.n. sleep, Zetia 10 mg p.o.     daily, Zocor 40 mg daily, bisoprolol 5 mg p.o. q.h.s.,     nortriptyline 50 mg p.o. q.h.s., Celexa 10 mg p.o. daily,     hydroxyzine 20 to 30 mg p.o. q.h.s. p.r.n. insomnia, hyoscyamine SR     0.375 mg p.o. b.i.d., gabapentin 600 mg p.o. t.i.d., Torsemide 10     mg daily, Nexium 40 mg p.o. q.h.s., Dilaudid 2 mg 1 to 2 tablets     every 4 hours as needed for moderate pain, Robaxin 500 mg p.o. q.6-     8h. p.r.n. spasm, Tylenol 325 one or two every 4 to 6 hours as     needed for mild pain, temperature or headache, Lomotil 1 tablet     p.o. t.i.d. p.r.n.  DIET:  Heart-healthy diet.  ACTIVITY:  She is partial weightbearing, 25 to 50% to the left lower extremity, gait training, ambulation, ADLs as per PT and OT, hip precautions, total hip protocol.  Please note she may start showering once she is transferred to [facility].  However do not submerge incision under water but she may start showering.  FOLLOWUP:  She is to follow up with Dr. Lequita Halt in the office 2 weeks from the date of surgery.  Please contact the office at [phone] to assist the patient, arranging appointment time and follow up transportation over for further care.  DISPOSITION:  Plan is  to go to [facility], Feb 08, 2011.  CONDITION ON DISCHARGE:  She is improving at the time of dictation, pending re-evaluation on the date of discharge which is planned for tomorrow.     Alexzandrew L. Julien Girt, P.A.C.   ______________________________ Ollen Gross, M.D.    ALP/MEDQ  D:  02/07/2011  T:  02/07/2011  Job:  [id number]  Electronically Signed by Patrica Duel P.A.C. on 02/10/2011 11:07:37 AM Electronically Signed by Ollen Gross M.D. on 02/12/2011 12:55:19 PM

## 2011-02-24 ENCOUNTER — Encounter: Payer: Self-pay | Admitting: Family Medicine

## 2011-02-24 NOTE — Progress Notes (Signed)
  Subjective:    Patient ID: Chelsea Hancock, female    DOB: [birth year], 77 y.o.   MRN: [id number] A 77 year old white divorced female is in today to be examined for surgical clearance to have left hip replaced on 16 May. Also did discuss her multiple medical problems including the fact she has right foot pain for some time after an injury the pain persist patient uses diclofenac sodium topical as well as Mobic orally for arthritis irritable bowel syndrome controlled with Levbid also is on treatment for osteopinea, patient to have pelvic and Pap smear today Overall complaint is the fact she has sore mouth 4 pound over the past 7-10 days in addition has remnants of a fever blister on her lip patient has peripheral neuropathy bilaterally for which she takes Neurontin, anxiety depression controlled with Celexa HPI    Review of Systemssee history of present illness     Objective:   Physical Exam The patient is a well-developed well-nourished cooperative pleasant white female who is alert HEENT negative except oral mucosa is erythematous with vesicular lesions compatible with viral stomatitis thyroid is normal in size following a recent nodule removed from thyroid Lungs clear to palpation percussion and  Auscultation no rales no dullness no wheezing Heart examination is normal size heart regular rhythm no murmur Breasts full no masses palpable axilla clear nipple normal Abdomen liver spleen kidneys nonpalpable no masses felt bowel sounds normal no tenderness Pelvic examination external introitus negative absent uterus vaginal mucosa normal Rectal exam negative adnexal areas negative Extremities tenderness over the left hip with pain on movement Tenderness right fifth metatarsal Skin negative       Assessment & Plan:  Impression the patient is in satisfactory condition for surgery lab studies to be reviewed Irritable bowel syndrome continue Levbid Arthritis continue Mobic as well  Pensaid Suspected fracture right foot to get a foot x-ray Postmenopausal syndrome to continue estrogen rx

## 2011-02-24 NOTE — Patient Instructions (Signed)
We'll notify you regarding x-ray of the right foot suspected old fracture Think  you physical  fit to have hip replacement left hip 16 May by Dr.Alluzio Continue prescribed medications

## 2011-02-25 ENCOUNTER — Telehealth: Payer: Self-pay

## 2011-02-25 NOTE — Telephone Encounter (Signed)
Message copied by Azucena Freed on Tue Feb 25, 2011  2:53 PM ------      Message from: Harvie Heck.      Created: Wed Feb 05, 2011  6:43 PM       Pap negative

## 2011-02-26 ENCOUNTER — Encounter (INDEPENDENT_AMBULATORY_CARE_PROVIDER_SITE_OTHER): Payer: Self-pay | Admitting: Surgery

## 2011-03-07 NOTE — Progress Notes (Signed)
Quick Note:  Pt aware ______ 

## 2011-03-11 ENCOUNTER — Telehealth: Payer: Self-pay | Admitting: Family Medicine

## 2011-03-11 DIAGNOSIS — K219 Gastro-esophageal reflux disease without esophagitis: Secondary | ICD-10-CM

## 2011-03-11 DIAGNOSIS — I10 Essential (primary) hypertension: Secondary | ICD-10-CM

## 2011-03-11 NOTE — Telephone Encounter (Signed)
Pharmacist called 6/19. Requests call back regarding this patient, who is S/P Hip replacement.

## 2011-03-12 NOTE — Telephone Encounter (Signed)
Pt is out of the hospital and doing good.  Pt is receiving rehab at home.

## 2011-03-14 NOTE — Telephone Encounter (Signed)
Called and spoke pharmacist Roger Shelter and he stated that the pt just had a hip replacement and all her meds were re-written upon discharge.  Roger Shelter is going to send over a list of medications Dr. Scotty Court prescribed so that they can be written again.  Per Roger Shelter when an rx is rewritten upon discharge of a surgery the PCP has to rewrite the rx because it is considered null and void.

## 2011-03-21 MED ORDER — HYDROXYZINE HCL 10 MG PO TABS: 10.0000 mg | ORAL_TABLET | Freq: Four times a day (QID) | ORAL | Status: DC | PRN

## 2011-03-21 MED ORDER — TORSEMIDE 10 MG PO TABS: 10.0000 mg | ORAL_TABLET | Freq: Every day | ORAL | Status: DC

## 2011-03-21 MED ORDER — SIMVASTATIN 40 MG PO TABS: 40.0000 mg | ORAL_TABLET | Freq: Every evening | ORAL | Status: DC

## 2011-03-21 MED ORDER — EZETIMIBE 10 MG PO TABS: 10.0000 mg | ORAL_TABLET | Freq: Every day | ORAL | Status: DC

## 2011-03-21 MED ORDER — ESOMEPRAZOLE MAGNESIUM 40 MG PO CPDR: 40.0000 mg | DELAYED_RELEASE_CAPSULE | Freq: Every day | ORAL | Status: DC

## 2011-03-21 MED ORDER — HYOSCYAMINE SULFATE 0.375 MG PO TB12: 0.3750 mg | ORAL_TABLET | Freq: Two times a day (BID) | ORAL | Status: DC | PRN

## 2011-03-21 MED ORDER — BISOPROLOL FUMARATE 5 MG PO TABS
5.0000 mg | ORAL_TABLET | Freq: Every day | ORAL | Status: DC
Start: 2011-03-21 — End: 2011-12-09

## 2011-03-21 MED ORDER — GABAPENTIN 300 MG PO CAPS: 300.0000 mg | ORAL_CAPSULE | Freq: Three times a day (TID) | ORAL | Status: DC

## 2011-03-21 MED ORDER — ESZOPICLONE 3 MG PO TABS: 3.0000 mg | ORAL_TABLET | Freq: Every day | ORAL | Status: DC

## 2011-03-21 MED ORDER — NORTRIPTYLINE HCL 25 MG PO CAPS: 25.0000 mg | ORAL_CAPSULE | Freq: Every day | ORAL | Status: DC

## 2011-03-21 NOTE — Telephone Encounter (Signed)
Ok per Dr. Scotty Court to fill pt's rx requested.

## 2011-03-23 LAB — HM MAMMOGRAPHY

## 2011-04-08 ENCOUNTER — Other Ambulatory Visit: Payer: Self-pay | Admitting: Neurological Surgery

## 2011-04-08 DIAGNOSIS — M5412 Radiculopathy, cervical region: Secondary | ICD-10-CM

## 2011-04-10 ENCOUNTER — Other Ambulatory Visit: Payer: Self-pay | Admitting: Family Medicine

## 2011-04-14 ENCOUNTER — Ambulatory Visit
Admission: RE | Admit: 2011-04-14 | Discharge: 2011-04-14 | Disposition: A | Discharge status: Home / Self Care | Payer: Medicare Other | Source: Ambulatory Visit | Attending: Neurological Surgery | Admitting: Neurological Surgery

## 2011-04-14 DIAGNOSIS — M5412 Radiculopathy, cervical region: Secondary | ICD-10-CM

## 2011-04-14 MED ORDER — GADOBENATE DIMEGLUMINE 529 MG/ML IV SOLN
14.0000 mL | Freq: Once | INTRAVENOUS | Status: AC | PRN
  Administered 2011-04-14: 14 mL via INTRAVENOUS

## 2011-04-22 ENCOUNTER — Other Ambulatory Visit: Payer: Self-pay | Admitting: Family Medicine

## 2011-04-22 MED ORDER — ESZOPICLONE 3 MG PO TABS: 3.0000 mg | ORAL_TABLET | Freq: Every day | ORAL | Status: DC

## 2011-04-22 NOTE — Telephone Encounter (Signed)
Pt's lunest 3 mg was not received on 03/21/11; called in on 04/22/11 for 30 x 5 rf.

## 2011-05-02 ENCOUNTER — Other Ambulatory Visit: Payer: Self-pay

## 2011-05-02 MED ORDER — NORTRIPTYLINE HCL 25 MG PO CAPS: 50.0000 mg | ORAL_CAPSULE | Freq: Every day | ORAL | Status: DC

## 2011-05-02 NOTE — Telephone Encounter (Signed)
rx sent to pharmacy

## 2011-05-08 ENCOUNTER — Other Ambulatory Visit: Payer: Self-pay | Admitting: Family Medicine

## 2011-05-12 ENCOUNTER — Encounter (INDEPENDENT_AMBULATORY_CARE_PROVIDER_SITE_OTHER): Payer: Medicare Other | Admitting: *Deleted

## 2011-05-12 ENCOUNTER — Telehealth: Payer: Self-pay | Admitting: Nurse Practitioner

## 2011-05-12 ENCOUNTER — Encounter: Payer: Self-pay | Admitting: Nurse Practitioner

## 2011-05-12 ENCOUNTER — Ambulatory Visit (INDEPENDENT_AMBULATORY_CARE_PROVIDER_SITE_OTHER): Payer: Medicare Other | Admitting: Nurse Practitioner

## 2011-05-12 VITALS — BP 138/82 | HR 60 | Ht 61.0 in | Wt 153.0 lb

## 2011-05-12 DIAGNOSIS — M79609 Pain in unspecified limb: Secondary | ICD-10-CM

## 2011-05-12 DIAGNOSIS — E785 Hyperlipidemia, unspecified: Secondary | ICD-10-CM

## 2011-05-12 DIAGNOSIS — R609 Edema, unspecified: Secondary | ICD-10-CM

## 2011-05-12 DIAGNOSIS — R002 Palpitations: Secondary | ICD-10-CM | POA: Insufficient documentation

## 2011-05-12 DIAGNOSIS — M7989 Other specified soft tissue disorders: Secondary | ICD-10-CM

## 2011-05-12 NOTE — Assessment & Plan Note (Signed)
She has edema primarily of the right leg. She did have hip surgery back in May. She continues with her rehab therapies. We will check a doppler to rule out DVT. She has not been using her support stockings. She is on very low dose diuretic and I have left her on her current dose. Further disposition to follow. Patient is agreeable to this plan and will call if any problems develop in the interim.

## 2011-05-12 NOTE — Patient Instructions (Signed)
We are going to get a doppler study on your right leg. If this is ok, I would encourage support stockings for the swelling We will tentatively see you back in 6 months with Dr. Patty Sermons. Call for any problems.

## 2011-05-12 NOTE — Telephone Encounter (Signed)
Ms. Chelsea Hancock wants to let you know that the patient's Venous Study was Negative for DVT.  She said to call patient @ home to let know.  If any questions, please call Harriett Chelsea Hancock back @ [phone].  Pt was seen today.

## 2011-05-12 NOTE — Assessment & Plan Note (Signed)
Well-controlled  at this time 

## 2011-05-12 NOTE — Telephone Encounter (Signed)
Notified of doppler report. Advised to wear support hose.,

## 2011-05-12 NOTE — Assessment & Plan Note (Signed)
Reports recent labs with her PCP.

## 2011-05-12 NOTE — Progress Notes (Signed)
Chelsea Hancock Date of Birth: 08/21/1934   History of Present Illness: Chelsea Hancock is seen back today for her 6 month visit. She is seen for Dr. Patty Sermons. She denies chest pain or shortness of breath. She continues to recover from hip replacement back in May. She is looking at more surgery for her right arm with Dr. Danielle Dess in the near future. She is tolerating her medicines but reports they are very costly. Her palpitations are pretty well controlled. Blood pressure has been ok. She does not use salt. She continues to have swelling of her right leg. It does not go down overnight. It is sore to touch. She is on low dose diuretic. She has had recent labs with her primary care.   Current Outpatient Prescriptions on File Prior to Visit  Medication Sig Dispense Refill  . aspirin 81 MG tablet Take 81 mg by mouth daily.        . bisoprolol (ZEBETA) 5 MG tablet Take 1 tablet (5 mg total) by mouth daily.  30 tablet  11  . Calcium Carbonate-Vitamin D (CALCIUM 600+D) 600-200 MG-UNIT TABS Take by mouth daily after breakfast. Dr. Phylliss Bob        . Clobetasol Prop Oint-Coal Tar (CLOBETAPLUS OINTMENT EX) Apply topically 2 (two) times daily.        . diphenoxylate-atropine (LOMOTIL) 2.5-0.025 MG per tablet Take 2 tablets by mouth 4 (four) times daily as needed.        Marland Kitchen esomeprazole (NEXIUM) 40 MG capsule Take 1 capsule (40 mg total) by mouth daily.  30 capsule  11  . estrogens, conjugated, (PREMARIN) 0.45 MG tablet Take 0.45 mg by mouth daily. Take daily for 21 days then do not take for 7 days.       . Eszopiclone (ESZOPICLONE) 3 MG TABS Take 1 tablet (3 mg total) by mouth at bedtime. Take immediately before bedtime  30 tablet  5  . ezetimibe (ZETIA) 10 MG tablet Take 1 tablet (10 mg total) by mouth daily.  30 tablet  5  . hydrOXYzine (ATARAX) 10 MG tablet TAKE ONE TABLET EVERY SIX HOURS AS      NEEDED FOR ITCHING  50 tablet  11  . Magnesium 250 MG TABS Take by mouth every morning. Dr. Phylliss Bob         . Multiple Vitamin (MULTIVITAMIN) tablet Take 1 tablet by mouth daily.        . naproxen (NAPROSYN) 500 MG tablet Take 500 mg by mouth 2 (two) times daily with a meal.        . nortriptyline (PAMELOR) 25 MG capsule Take 2 capsules (50 mg total) by mouth at bedtime.  30 capsule  11  . Probiotic Product (ALIGN) 4 MG CAPS Take 4 mg by mouth daily.        . simvastatin (ZOCOR) 40 MG tablet Take 1 tablet (40 mg total) by mouth every evening.  30 tablet  11  . torsemide (DEMADEX) 10 MG tablet Take 1 tablet (10 mg total) by mouth daily.  30 tablet  5  . Zoledronic Acid (RECLAST IV) Inject into the vein as directed.          Allergies  Allergen Reactions  . Alendronate Sodium     Severe chest pain similar to heart attack   . Ceftin   . Cefuroxime Axetil   . Codeine Nausea And Vomiting  . Demerol Nausea And Vomiting  . Macrodantin   . Meperidine Hcl   .  Nitrofurantoin   . Other     Hismanal and Maprobamate  . Sulfonamide Derivatives     Past Medical History  Diagnosis Date  . Fibromyalgia   . Hyperlipidemia   . GERD (gastroesophageal reflux disease)   . Palpitation   . Carotid bruit     Right  . Edema     Past Surgical History  Procedure Date  . Abdominal hysterectomy 1981  . Laminectomy 1960  . Veins stripped 1970  . Back surgery 1997  . Total hip arthroplasty 2012    Left    History  Smoking status  . Former Smoker  Smokeless tobacco  . Never Used    History  Alcohol Use  . Yes    WINE WITH DINNER. 2 GLASSES RED WINE    Family History  Problem Relation Age of Onset  . Arthritis      family hx  . Hyperlipidemia      family hx  . Hypertension      family hx  . Cancer      family hx  . Stroke      family hx  . Mental illness      family hx  . Cancer Mother   . Stroke Father     Review of Systems: The review of systems is positive for edema of the right leg.  All other systems were reviewed and are negative.  Physical Exam: BP 138/82  Pulse 60   Ht 5\' 1"  (1.549 m)  Wt 153 lb (69.4 kg)  BMI 28.91 kg/m2 Patient is very pleasant and in no acute distress. Skin is warm and dry. Color is normal.  HEENT is unremarkable. Normocephalic/atraumatic. PERRL. Sclera are nonicteric. Neck is supple. No masses. No JVD. Lungs are clear. Cardiac exam shows a regular rate and rhythm. Abdomen is soft. Extremities show at least 1+ edema on the right. None on the left. Gait and ROM are intact. She is using her cane. No gross neurologic deficits noted.  LABORATORY DATA:   Assessment / Plan:

## 2011-05-13 ENCOUNTER — Encounter: Payer: Self-pay | Admitting: Nurse Practitioner

## 2011-05-14 NOTE — Progress Notes (Signed)
Reported by Synetta Fail

## 2011-05-22 ENCOUNTER — Telehealth: Payer: Self-pay | Admitting: Cardiology

## 2011-05-22 NOTE — Telephone Encounter (Signed)
Needs faxed last OV, EKG, Stress, Echo, if at all available.  Procedure on 06/03/11.

## 2011-05-23 NOTE — Telephone Encounter (Signed)
Faxed last OV, EKG, and Echo today.  No Stress available.

## 2011-06-03 HISTORY — PX: OTHER SURGICAL HISTORY: SHX169

## 2011-06-16 ENCOUNTER — Ambulatory Visit (INDEPENDENT_AMBULATORY_CARE_PROVIDER_SITE_OTHER): Payer: Medicare Other | Admitting: Internal Medicine

## 2011-06-16 ENCOUNTER — Encounter: Payer: Self-pay | Admitting: Internal Medicine

## 2011-06-16 DIAGNOSIS — K219 Gastro-esophageal reflux disease without esophagitis: Secondary | ICD-10-CM

## 2011-06-16 DIAGNOSIS — K921 Melena: Secondary | ICD-10-CM

## 2011-06-16 DIAGNOSIS — IMO0001 Reserved for inherently not codable concepts without codable children: Secondary | ICD-10-CM

## 2011-06-16 DIAGNOSIS — S339XXA Sprain of unspecified parts of lumbar spine and pelvis, initial encounter: Secondary | ICD-10-CM

## 2011-06-16 DIAGNOSIS — M5137 Other intervertebral disc degeneration, lumbosacral region: Secondary | ICD-10-CM

## 2011-06-16 DIAGNOSIS — D649 Anemia, unspecified: Secondary | ICD-10-CM

## 2011-06-16 LAB — CBC WITH DIFFERENTIAL/PLATELET
Basophils Absolute: 0 10*3/uL (ref 0.0–0.1)
Basophils Relative: 0.3 % (ref 0.0–3.0)
Eosinophils Absolute: 0.1 10*3/uL (ref 0.0–0.7)
Eosinophils Relative: 0.6 % (ref 0.0–5.0)
HCT: 40.3 % (ref 36.0–46.0)
Hemoglobin: 13.3 g/dL (ref 12.0–15.0)
Lymphocytes Relative: 22 % (ref 12.0–46.0)
Lymphs Abs: 2.3 10*3/uL (ref 0.7–4.0)
MCHC: 33.2 g/dL (ref 30.0–36.0)
MCV: 91 fl (ref 78.0–100.0)
Monocytes Absolute: 0.7 10*3/uL (ref 0.1–1.0)
Monocytes Relative: 7.1 % (ref 3.0–12.0)
Neutro Abs: 7.3 10*3/uL (ref 1.4–7.7)
Neutrophils Relative %: 70 % (ref 43.0–77.0)
Platelets: 337 10*3/uL (ref 150.0–400.0)
RBC: 4.42 Mil/uL (ref 3.87–5.11)
RDW: 15.1 % — ABNORMAL HIGH (ref 11.5–14.6)
WBC: 10.4 10*3/uL (ref 4.5–10.5)

## 2011-06-16 NOTE — Progress Notes (Signed)
  Subjective:    Patient ID: Chelsea Hancock, female    DOB: April 29, 1934, 77 y.o.   MRN: [id number]  HPI  77 year old patient who is seen today with a 3 week history of dark stool. She has been taking iron therapy of unclear duration but feels that she may have them placed on iron approximately 3 weeks ago. Ibuprofen was also discontinued about 3 weeks ago and she was placed on tramadol she complains of some increased gaseousness but no abdominal pain no weakness or fatigue. She does have a history of IBS DJD and fibromyalgia. Due to gastrointestinal reflux disease she has been on chronic Nexium therapy. Her last colonoscopy was 2008. She's had recent surgery for a carpal tunnel release and also for a ulnar entrapment neuropathy    Review of Systems  Constitutional: Negative.   HENT: Negative for hearing loss, congestion, sore throat, rhinorrhea, dental problem, sinus pressure and tinnitus.   Eyes: Negative for pain, discharge and visual disturbance.  Respiratory: Negative for cough and shortness of breath.   Cardiovascular: Negative for chest pain, palpitations and leg swelling.  Gastrointestinal: Positive for blood in stool. Negative for nausea, vomiting, abdominal pain, diarrhea, constipation and abdominal distention.  Genitourinary: Negative for dysuria, urgency, frequency, hematuria, flank pain, vaginal bleeding, vaginal discharge, difficulty urinating, vaginal pain and pelvic pain.  Musculoskeletal: Negative for joint swelling, arthralgias and gait problem.  Skin: Negative for rash.  Neurological: Negative for dizziness, syncope, speech difficulty, weakness, numbness and headaches.  Hematological: Negative for adenopathy.  Psychiatric/Behavioral: Negative for behavioral problems, dysphoric mood and agitation. The patient is not nervous/anxious.        Objective:   Physical Exam  Constitutional: She is oriented to person, place, and time. She appears well-developed and  well-nourished. No distress.       Blood pressure 110/70. No tachycardia  HENT:  Head: Normocephalic.  Mouth/Throat: Oropharynx is clear and moist.  Eyes: Conjunctivae and EOM are normal. Pupils are equal, round, and reactive to light.  Neck: Normal range of motion. Neck supple. No thyromegaly present.  Cardiovascular: Normal rate, regular rhythm, normal heart sounds and intact distal pulses.   Pulmonary/Chest: Effort normal and breath sounds normal.  Abdominal: Soft. Bowel sounds are normal. She exhibits no mass. There is no tenderness.       Stool is normal in appearance and hematemesis negative  Musculoskeletal: Normal range of motion.  Lymphadenopathy:    She has no cervical adenopathy.  Neurological: She is alert and oriented to person, place, and time.  Skin: Skin is warm and dry. No rash noted.  Psychiatric: She has a normal mood and affect. Her behavior is normal.          Assessment & Plan:   History of melena. This likely is related to iron supplementation. We'll check a CBC Osteoarthritis fibromyalgia. We'll continue Ultram for pain History of anemia. We'll check a CBC with differential Gastroesophageal reflux disease. We'll continue Nexium

## 2011-06-16 NOTE — Patient Instructions (Signed)
Discontinue iron therapy  Call if  black stool reoccurs

## 2011-07-14 ENCOUNTER — Ambulatory Visit
Admission: RE | Admit: 2011-07-14 | Discharge: 2011-07-14 | Disposition: A | Discharge status: Home / Self Care | Payer: Medicare Other | Source: Ambulatory Visit | Attending: Surgery | Admitting: Surgery

## 2011-07-14 DIAGNOSIS — E041 Nontoxic single thyroid nodule: Secondary | ICD-10-CM

## 2011-07-17 ENCOUNTER — Ambulatory Visit
Admission: RE | Admit: 2011-07-17 | Discharge: 2011-07-17 | Disposition: A | Discharge status: Home / Self Care | Payer: Medicare Other | Source: Ambulatory Visit | Attending: Surgery | Admitting: Surgery

## 2011-07-21 ENCOUNTER — Telehealth (INDEPENDENT_AMBULATORY_CARE_PROVIDER_SITE_OTHER): Payer: Self-pay | Admitting: Surgery

## 2011-07-21 NOTE — Telephone Encounter (Signed)
Appointment may be schedule sooner.

## 2011-08-11 ENCOUNTER — Ambulatory Visit (INDEPENDENT_AMBULATORY_CARE_PROVIDER_SITE_OTHER): Payer: Medicare Other | Admitting: Family Medicine

## 2011-08-11 ENCOUNTER — Encounter: Payer: Self-pay | Admitting: Family Medicine

## 2011-08-11 VITALS — BP 139/77 | HR 69 | Temp 98.2°F | Ht 63.25 in | Wt 153.4 lb

## 2011-08-11 DIAGNOSIS — M899 Disorder of bone, unspecified: Secondary | ICD-10-CM

## 2011-08-11 DIAGNOSIS — M949 Disorder of cartilage, unspecified: Secondary | ICD-10-CM

## 2011-08-11 DIAGNOSIS — K589 Irritable bowel syndrome without diarrhea: Secondary | ICD-10-CM

## 2011-08-11 DIAGNOSIS — M25569 Pain in unspecified knee: Secondary | ICD-10-CM

## 2011-08-11 DIAGNOSIS — M5412 Radiculopathy, cervical region: Secondary | ICD-10-CM

## 2011-08-11 DIAGNOSIS — R002 Palpitations: Secondary | ICD-10-CM

## 2011-08-11 DIAGNOSIS — IMO0001 Reserved for inherently not codable concepts without codable children: Secondary | ICD-10-CM

## 2011-08-11 DIAGNOSIS — E039 Hypothyroidism, unspecified: Secondary | ICD-10-CM

## 2011-08-11 DIAGNOSIS — D649 Anemia, unspecified: Secondary | ICD-10-CM

## 2011-08-11 DIAGNOSIS — R0989 Other specified symptoms and signs involving the circulatory and respiratory systems: Secondary | ICD-10-CM

## 2011-08-11 DIAGNOSIS — Z23 Encounter for immunization: Secondary | ICD-10-CM

## 2011-08-11 DIAGNOSIS — K219 Gastro-esophageal reflux disease without esophagitis: Secondary | ICD-10-CM

## 2011-08-11 NOTE — Assessment & Plan Note (Signed)
She is presently experiencing weakness and numbness in her left hand and now some symptoms are developing in her right hand as well. She is  Scheduled for preop appt with her neurosurgeon soon and is going to proceed with two more surgeries in the cervical spine to try and diminish her symptoms

## 2011-08-11 NOTE — Assessment & Plan Note (Signed)
Has some small cysts she is following with serial ultrasounds and we reviewed her October [place] with her and disease is stable.

## 2011-08-11 NOTE — Assessment & Plan Note (Signed)
She has been told she has end stage disease in this knee and she is going to schedule replacement with Dr Despina Hick after her neck is done

## 2011-08-11 NOTE — Progress Notes (Signed)
Chelsea Hancock [id number] 23-May-1934 08/11/2011      Progress Note New Patient  Subjective  Chief Complaint  No chief complaint on file.   HPI  Patient is a 77 yo caucasian female in today to establish care and her major c/o are musculoskeletal. She has already had multiple surgeries on her low back and neck dating back many years. She has a repeat surgery scheduled in the next couple of weeks on her neck. After she recovers from the neck she plans to proceed with a Right  TKR. Heartburn is well controlled and she has no other acute complaints. No CP/palp?sob/GI or GU c/o  Past Medical History  Diagnosis Date  . Fibromyalgia   . Hyperlipidemia   . GERD (gastroesophageal reflux disease)   . Palpitation   . Carotid bruit     Right  . Edema     Past Surgical History  Procedure Date  . Abdominal hysterectomy 1981  . Laminectomy 1960  . Veins stripped 1970  . Back surgery 1997  . Total hip arthroplasty 2012    Left  . Carpal tunnel release     left, ulnar nerve release at elbow    Family History  Problem Relation Age of Onset  . Arthritis      family hx  . Cancer      family hx  . Stroke    . Mental illness    . Hyperlipidemia    . Hypertension    . Cancer Mother   . Stroke Father   . HIV Son     Aids    History   Social History  . Marital Status: Divorced    Spouse Name: N/A    Number of Children: N/A  . Years of Education: N/A   Occupational History  . Not on file.   Social History Main Topics  . Smoking status: Former Smoker -- 4 years    Types: Cigarettes    Quit date: 09/22/1990  . Smokeless tobacco: Never Used  . Alcohol Use: Yes     WINE WITH DINNER. 2 GLASSES RED WINE  . Drug Use: No  . Sexually Active: Not on file   Other Topics Concern  . Not on file   Social History Narrative  . No narrative on file    Current Outpatient Prescriptions on File Prior to Visit  Medication Sig Dispense Refill  . aspirin 81 MG tablet Take  81 mg by mouth daily.        . bisoprolol (ZEBETA) 5 MG tablet Take 1 tablet (5 mg total) by mouth daily.  30 tablet  11  . Calcium Carbonate-Vitamin D (CALCIUM 600+D) 600-200 MG-UNIT TABS Take by mouth daily after breakfast. Dr. Phylliss Bob        . Clobetasol Prop Oint-Coal Tar (CLOBETAPLUS OINTMENT EX) Apply topically 2 (two) times daily.        . diphenoxylate-atropine (LOMOTIL) 2.5-0.025 MG per tablet Take 2 tablets by mouth 4 (four) times daily as needed.        Marland Kitchen esomeprazole (NEXIUM) 40 MG capsule Take 1 capsule (40 mg total) by mouth daily.  30 capsule  11  . estrogens, conjugated, (PREMARIN) 0.45 MG tablet Take 0.45 mg by mouth daily. Take daily for 21 days then do not take for 7 days.       Marland Kitchen ezetimibe (ZETIA) 10 MG tablet Take 1 tablet (10 mg total) by mouth daily.  30 tablet  5  . gabapentin (NEURONTIN) 600 MG  tablet Take 600 mg by mouth 3 (three) times daily.        . hydrOXYzine (ATARAX) 10 MG tablet TAKE ONE TABLET EVERY SIX HOURS AS      NEEDED FOR ITCHING  50 tablet  11  . Magnesium 250 MG TABS Take by mouth every morning. Dr. Phylliss Bob       . Multiple Vitamin (MULTIVITAMIN) tablet Take 1 tablet by mouth daily.        . nortriptyline (PAMELOR) 25 MG capsule Take 2 capsules (50 mg total) by mouth at bedtime.  30 capsule  11  . Probiotic Product (ALIGN) 4 MG CAPS Take 4 mg by mouth daily.        . simvastatin (ZOCOR) 40 MG tablet Take 1 tablet (40 mg total) by mouth every evening.  30 tablet  11  . torsemide (DEMADEX) 10 MG tablet Take 1 tablet (10 mg total) by mouth daily.  30 tablet  5  . Zoledronic Acid (RECLAST IV) Inject into the vein as directed.          Allergies  Allergen Reactions  . Alendronate Sodium     Severe chest pain similar to heart attack   . Cefuroxime Axetil   . Codeine Nausea And Vomiting  . Demerol Nausea And Vomiting  . Macrodantin   . Meperidine Hcl   . Nitrofurantoin   . Other     Hismanal and Maprobamate  . Sulfonamide Derivatives     Review of  Systems  Review of Systems  Constitutional: Negative for fever, chills and malaise/fatigue.  HENT: Positive for neck pain. Negative for hearing loss, nosebleeds and congestion.   Eyes: Negative for discharge.  Respiratory: Negative for cough, sputum production, shortness of breath and wheezing.   Cardiovascular: Negative for chest pain, palpitations and leg swelling.  Gastrointestinal: Negative for heartburn, nausea, vomiting, abdominal pain, diarrhea, constipation and blood in stool.  Genitourinary: Negative for dysuria, urgency, frequency and hematuria.  Musculoskeletal: Positive for myalgias, back pain and joint pain. Negative for falls.  Skin: Negative for rash.  Neurological: Positive for sensory change and focal weakness. Negative for dizziness, tremors, seizures, loss of consciousness, weakness and headaches.  Endo/Heme/Allergies: Negative for polydipsia. Does not bruise/bleed easily.  Psychiatric/Behavioral: Negative for depression and suicidal ideas. The patient is not nervous/anxious and does not have insomnia.     Objective  BP 139/77  Pulse 69  Temp(Src) 98.2 F (36.8 C) (Oral)  Ht 5' 3.25" (1.607 m)  Wt 153 lb 6.4 oz (69.582 kg)  BMI 26.96 kg/m2  SpO2 95%  Physical Exam  Physical Exam  Constitutional: She is oriented to person, place, and time and well-developed, well-nourished, and in no distress. No distress.  HENT:  Head: Normocephalic and atraumatic.  Right Ear: External ear normal.  Left Ear: External ear normal.  Nose: Nose normal.  Mouth/Throat: Oropharynx is clear and moist. No oropharyngeal exudate.  Eyes: Conjunctivae are normal. Pupils are equal, round, and reactive to light. Right eye exhibits no discharge. Left eye exhibits no discharge. No scleral icterus.  Neck: Normal range of motion. Neck supple. No thyromegaly present.  Cardiovascular: Normal rate, regular rhythm, normal heart sounds and intact distal pulses.   No murmur heard. Pulmonary/Chest:  Effort normal and breath sounds normal. No respiratory distress. She has no wheezes. She has no rales.  Abdominal: Soft. Bowel sounds are normal. She exhibits no distension and no mass. There is no tenderness.  Musculoskeletal: Normal range of motion. She exhibits no edema and  no tenderness.  Lymphadenopathy:    She has no cervical adenopathy.  Neurological: She is alert and oriented to person, place, and time. She has normal reflexes. No cranial nerve deficit. Coordination normal.  Skin: Skin is warm and dry. No rash noted. She is not diaphoretic.  Psychiatric: Mood, memory and affect normal.       Assessment & Plan  CERVICAL RADICULOPATHY She is presently experiencing weakness and numbness in her left hand and now some symptoms are developing in her right hand as well. She is  Scheduled for preop appt with her neurosurgeon soon and is going to proceed with two more surgeries in the cervical spine to try and diminish her symptoms  KNEE PAIN, RIGHT She has been told she has end stage disease in this knee and she is going to schedule replacement with Dr Despina Hick after her neck is done  OSTEOPENIA Patient reports a good response to Reclast, last infusion in March in 2012  HYPOTHYROIDISM Has some small cysts she is following with serial ultrasounds and we reviewed her October [place] with her and disease is stable.   Palpitation Patient notes good response to beta blockade is being followed by cardiology  GERD Patient denies any symptoms at this time. No meds needed with dietary changes  ANEMIA Patient agrees to repeat labs prior to next visit  CAROTID BRUIT Not audible at today's visit  FIBROMYALGIA Follows with rheumatology.   IRRITABLE BOWEL SYNDROME Improved bowel habits

## 2011-08-11 NOTE — Assessment & Plan Note (Signed)
Improved bowel habits

## 2011-08-11 NOTE — Assessment & Plan Note (Signed)
Not audible at today's visit

## 2011-08-11 NOTE — Assessment & Plan Note (Signed)
Patient notes good response to beta blockade is being followed by cardiology

## 2011-08-11 NOTE — Assessment & Plan Note (Signed)
Patient agrees to repeat labs prior to next visit

## 2011-08-11 NOTE — Assessment & Plan Note (Signed)
Follows with rheumatology. 

## 2011-08-11 NOTE — Assessment & Plan Note (Signed)
Patient denies any symptoms at this time. No meds needed with dietary changes

## 2011-08-11 NOTE — Assessment & Plan Note (Signed)
Patient reports a good response to Reclast, last infusion in March in 2012

## 2011-08-11 NOTE — Patient Instructions (Signed)

## 2011-08-18 ENCOUNTER — Other Ambulatory Visit (HOSPITAL_COMMUNITY): Payer: Self-pay | Admitting: Neurological Surgery

## 2011-08-18 ENCOUNTER — Other Ambulatory Visit: Payer: Self-pay | Admitting: Neurological Surgery

## 2011-08-18 DIAGNOSIS — M542 Cervicalgia: Secondary | ICD-10-CM

## 2011-08-26 ENCOUNTER — Encounter: Payer: Self-pay | Admitting: Neurological Surgery

## 2011-08-26 ENCOUNTER — Ambulatory Visit (HOSPITAL_COMMUNITY)
Admission: RE | Admit: 2011-08-26 | Discharge: 2011-08-26 | Disposition: A | Discharge status: Home / Self Care | Payer: Medicare Other | Source: Ambulatory Visit | Attending: Neurological Surgery | Admitting: Neurological Surgery

## 2011-08-26 ENCOUNTER — Other Ambulatory Visit: Payer: Self-pay | Admitting: Neurological Surgery

## 2011-08-26 DIAGNOSIS — M542 Cervicalgia: Secondary | ICD-10-CM

## 2011-08-26 DIAGNOSIS — Z981 Arthrodesis status: Secondary | ICD-10-CM | POA: Insufficient documentation

## 2011-08-26 MED ORDER — HYDROCODONE-ACETAMINOPHEN 5-325 MG PO TABS: 1.0000 | ORAL_TABLET | ORAL | Status: DC | PRN

## 2011-08-26 MED ORDER — ONDANSETRON HCL 4 MG/2ML IJ SOLN: 4.0000 mg | Freq: Four times a day (QID) | INTRAMUSCULAR | Status: DC | PRN

## 2011-08-26 MED ORDER — IOHEXOL 300 MG/ML  SOLN
10.0000 mL | Freq: Once | INTRAMUSCULAR | Status: AC | PRN
  Administered 2011-08-26: 10 mL via INTRATHECAL

## 2011-08-26 MED ORDER — DIAZEPAM 5 MG PO TABS
10.0000 mg | ORAL_TABLET | Freq: Once | ORAL | Status: AC
  Administered 2011-08-26: 10 mg via ORAL

## 2011-08-26 MED ORDER — HYDROCODONE-ACETAMINOPHEN 5-325 MG PO TABS
ORAL_TABLET | ORAL | Status: AC
  Administered 2011-08-26: 1 via OROMUCOSAL
  Filled 2011-08-26: qty 1

## 2011-08-26 MED ORDER — MUPIROCIN 2 % EX OINT
TOPICAL_OINTMENT | CUTANEOUS | Status: AC
  Filled 2011-08-26: qty 22

## 2011-08-26 NOTE — H&P (Signed)
Chelsea Hancock is an 77 y.o. female.   Chief Complaint: neck pain  Weakness in hands HPI: s/p carpal tunnnel decompression with weakness of interossei and atrophy. No relief after carpal tunnel surgery. History of extensive cervical spondylosis with surgical decompression by anterior and posterior means. Question of c7-T1 pathology. For myelogram because Mri hampered metal artifact.  Past Medical History  Diagnosis Date  . Fibromyalgia   . Hyperlipidemia   . GERD (gastroesophageal reflux disease)   . Palpitation   . Carotid bruit     Right  . Edema     Past Surgical History  Procedure Date  . Abdominal hysterectomy 1981  . Laminectomy 1960  . Veins stripped 1970  . Back surgery 1997  . Total hip arthroplasty 2012    Left  . Carpal tunnel release     left, ulnar nerve release at elbow    Family History  Problem Relation Age of Onset  . Arthritis      family hx  . Cancer      family hx  . Stroke    . Mental illness    . Hyperlipidemia    . Hypertension    . Cancer Mother   . Stroke Father   . HIV Son     Aids   Social History:  reports that she quit smoking about 20 years ago. Her smoking use included Cigarettes. She quit after 4 years of use. She has never used smokeless tobacco. She reports that she drinks alcohol. She reports that she does not use illicit drugs.  Allergies:  Allergies  Allergen Reactions  . Alendronate Sodium     Severe chest pain similar to heart attack   . Cefuroxime Axetil   . Codeine Nausea And Vomiting  . Demerol Nausea And Vomiting  . Macrodantin   . Meperidine Hcl   . Nitrofurantoin   . Other     Hismanal and Maprobamate  . Sulfonamide Derivatives     Medications Prior to Admission  Medication Sig Dispense Refill  . aspirin 81 MG tablet Take 81 mg by mouth daily.        . Biotin 5 MG CAPS Take 1 capsule by mouth daily.        . bisoprolol (ZEBETA) 5 MG tablet Take 1 tablet (5 mg total) by mouth daily.  30 tablet  11  .  Calcium Carbonate-Vitamin D (CALCIUM 600+D) 600-200 MG-UNIT TABS Take by mouth daily after breakfast. Dr. Phylliss Hancock        . Clobetasol Prop Oint-Coal Tar (CLOBETAPLUS OINTMENT EX) Apply topically 2 (two) times daily.        . diphenoxylate-atropine (LOMOTIL) 2.5-0.025 MG per tablet Take 2 tablets by mouth 4 (four) times daily as needed.        Marland Kitchen esomeprazole (NEXIUM) 40 MG capsule Take 1 capsule (40 mg total) by mouth daily.  30 capsule  11  . estrogens, conjugated, (PREMARIN) 0.45 MG tablet Take 0.45 mg by mouth daily. Take daily for 21 days then do not take for 7 days.       . Eszopiclone (ESZOPICLONE) 3 MG TABS Take 3 mg by mouth at bedtime. Take immediately before bedtime       . ezetimibe (ZETIA) 10 MG tablet Take 1 tablet (10 mg total) by mouth daily.  30 tablet  5  . gabapentin (NEURONTIN) 600 MG tablet Take 600 mg by mouth 3 (three) times daily.        . hydrOXYzine (ATARAX) 10  MG tablet TAKE ONE TABLET EVERY SIX HOURS AS      NEEDED FOR ITCHING  50 tablet  11  . ibuprofen (ADVIL,MOTRIN) 400 MG tablet Take 400 mg by mouth 3 (three) times daily.        . Magnesium 250 MG TABS Take by mouth every morning. Dr. Phylliss Hancock       . Multiple Vitamin (MULTIVITAMIN) tablet Take 1 tablet by mouth daily.        . nortriptyline (PAMELOR) 25 MG capsule Take 2 capsules (50 mg total) by mouth at bedtime.  30 capsule  11  . Probiotic Product (ALIGN) 4 MG CAPS Take 4 mg by mouth daily.        . simvastatin (ZOCOR) 40 MG tablet Take 1 tablet (40 mg total) by mouth every evening.  30 tablet  11  . torsemide (DEMADEX) 10 MG tablet Take 1 tablet (10 mg total) by mouth daily.  30 tablet  5  . Zoledronic Acid (RECLAST IV) Inject into the vein as directed.         Medications Prior to Admission  Medication Dose Route Frequency Provider Last Rate Last Dose  . diazepam (VALIUM) tablet 10 mg  10 mg Oral Once Chelsea Hancock   10 mg at 08/26/11 0754  . ondansetron (ZOFRAN) injection 4 mg  4 mg Intravenous Q6H PRN Chelsea Key  Chelsea Hancock        No results found for this or any previous visit (from the past 48 hour(s)). No results found.  Review of Systems  HENT: Positive for neck pain.   Eyes: Negative.   Respiratory: Negative.   Cardiovascular: Negative.   Gastrointestinal: Negative.   Genitourinary: Negative.   Musculoskeletal:       Weakness in hands, atrophy in interossei  Neurological: Positive for tingling and weakness.       Atrophy of interossei  Endo/Heme/Allergies: Negative.   Psychiatric/Behavioral: Negative.     There were no vitals taken for this visit. Physical Exam  Atrophy of interossei muscles in both hands Assessment/Plan Cervical myelogram  Chelsea Hancock J 08/26/2011, 9:43 AM

## 2011-09-02 ENCOUNTER — Ambulatory Visit (INDEPENDENT_AMBULATORY_CARE_PROVIDER_SITE_OTHER): Payer: Medicare Other | Admitting: Surgery

## 2011-09-02 ENCOUNTER — Ambulatory Visit (INDEPENDENT_AMBULATORY_CARE_PROVIDER_SITE_OTHER): Payer: Self-pay | Admitting: Surgery

## 2011-09-02 ENCOUNTER — Encounter (INDEPENDENT_AMBULATORY_CARE_PROVIDER_SITE_OTHER): Payer: Self-pay | Admitting: Surgery

## 2011-09-02 VITALS — BP 128/78 | HR 60 | Temp 97.4°F | Resp 16 | Ht 63.0 in | Wt 151.0 lb

## 2011-09-02 DIAGNOSIS — E042 Nontoxic multinodular goiter: Secondary | ICD-10-CM | POA: Insufficient documentation

## 2011-09-02 NOTE — Progress Notes (Signed)
Visit Diagnoses: 1. Multinodular thyroid     HISTORY: Patient is a 77 year old white female with multinodular thyroid gland. She was last evaluated here in March 2012. At my request she underwent a followup thyroid ultrasound in October 2012. This showed benign appearing bilateral thyroid nodules and cysts which were unchanged from her prior study of March 2012. Patient has not had any recent laboratory work. She is not on thyroid hormone supplementation.   PERTINENT REVIEW OF SYSTEMS: Patient denies any new masses or discomfort in the neck. She denies tremor. She denies palpitations.   EXAM: HEENT: normocephalic; pupils equal and reactive; sclerae clear; dentition good; mucous membranes moist NECK:  Multiple small, soft, bilat nodules; symmetric on extension; no palpable anterior or posterior cervical lymphadenopathy; no supraclavicular masses; no tenderness CHEST: clear to auscultation bilaterally without rales, rhonchi, or wheezes CARDIAC: regular rate and rhythm without significant murmur; peripheral pulses are full EXT:  non-tender without edema; no deformity NEURO: no gross focal deficits; no sign of tremor   IMPRESSION: Multinodular thyroid gland, clinically stable   PLAN: Patient will see her primary care physician in March 2013. She should have a TSH level checked at that time. I would like to repeat her thyroid ultrasound in one year. I will see her back for physical examination following that study.   Velora Heckler, MD, FACS General & Endocrine Surgery [facility]

## 2011-09-04 ENCOUNTER — Other Ambulatory Visit: Payer: Self-pay | Admitting: Family Medicine

## 2011-09-05 NOTE — Telephone Encounter (Signed)
Pt last seen 08/11/11. Pls advise.

## 2011-09-26 ENCOUNTER — Other Ambulatory Visit: Payer: Self-pay | Admitting: Family Medicine

## 2011-09-26 DIAGNOSIS — M1710 Unilateral primary osteoarthritis, unspecified knee: Secondary | ICD-10-CM | POA: Diagnosis not present

## 2011-09-26 DIAGNOSIS — IMO0002 Reserved for concepts with insufficient information to code with codable children: Secondary | ICD-10-CM | POA: Diagnosis not present

## 2011-10-06 DIAGNOSIS — IMO0001 Reserved for inherently not codable concepts without codable children: Secondary | ICD-10-CM | POA: Diagnosis not present

## 2011-10-06 DIAGNOSIS — M1990 Unspecified osteoarthritis, unspecified site: Secondary | ICD-10-CM | POA: Diagnosis not present

## 2011-10-21 ENCOUNTER — Other Ambulatory Visit: Payer: Self-pay | Admitting: Family Medicine

## 2011-11-05 ENCOUNTER — Encounter: Payer: Self-pay | Admitting: *Deleted

## 2011-11-10 ENCOUNTER — Ambulatory Visit (INDEPENDENT_AMBULATORY_CARE_PROVIDER_SITE_OTHER): Payer: Medicare Other | Admitting: Cardiology

## 2011-11-10 ENCOUNTER — Encounter: Payer: Self-pay | Admitting: Cardiology

## 2011-11-10 DIAGNOSIS — R002 Palpitations: Secondary | ICD-10-CM | POA: Diagnosis not present

## 2011-11-10 DIAGNOSIS — R0989 Other specified symptoms and signs involving the circulatory and respiratory systems: Secondary | ICD-10-CM | POA: Diagnosis not present

## 2011-11-10 DIAGNOSIS — R609 Edema, unspecified: Secondary | ICD-10-CM | POA: Diagnosis not present

## 2011-11-10 DIAGNOSIS — M5412 Radiculopathy, cervical region: Secondary | ICD-10-CM

## 2011-11-10 NOTE — Assessment & Plan Note (Signed)
The patient has had some increased peripheral edema particularly of the right ankle.  She is trying to limit dietary salt intake as well as dietary carbohydrate intake to help her much gain more weight.  She has not been experiencing any symptoms of congestive heart failure.

## 2011-11-10 NOTE — Assessment & Plan Note (Signed)
The patient has a soft right carotid bruit.  She had a carotid duplex examination on 11/12/10 which showed plaque at both bifurcations but no hemodynamically significant obstructive lesions.  The patient has not been experiencing any TIA symptoms.  He does have a history of hypercholesterolemia followed by her primary care.

## 2011-11-10 NOTE — Assessment & Plan Note (Signed)
The patient has been having more palpitations.  She has been anxious over the upcoming surgery.  She has not been experiencing any chest pain or angina.

## 2011-11-10 NOTE — Patient Instructions (Signed)
Your physician wants you to follow-up in: 6 months  You will receive a reminder letter in the mail two months in advance. If you don't receive a letter, please call our office to schedule the follow-up appointment.  Your physician recommends that you continue on your current medications as directed. Please refer to the Current Medication list given to you today.  

## 2011-11-10 NOTE — Progress Notes (Signed)
Chelsea Hancock Date of Birth:  [birth year] [facility] [address] [place], [place]  [address] [phone]         Fax   [phone]  History of Present Illness: This pleasant 78 year old woman is seen for a six-month followup office visit.  She has a past history of dyslipidemia and palpitations.  She has had a lot of neurosurgical problems.  She is anticipating her fifth and her 6 neurosurgery coming up soon.  She is going to have to have sequential anterior approach and posterior approach cervical spine surgery by Dr. Danielle Dess.  She has been losing the use of her left hand.  Dr. Amanda Pea is also consulting.  When she has finished those operations Dr. Lequita Halt will perform a right total knee arthroplasty.  Current Outpatient Prescriptions  Medication Sig Dispense Refill  . aspirin 81 MG tablet Take 81 mg by mouth daily.        . Biotin 5 MG CAPS Take 1 capsule by mouth daily.        . bisoprolol (ZEBETA) 5 MG tablet Take 1 tablet (5 mg total) by mouth daily.  30 tablet  11  . Calcium Carbonate-Vitamin D (CALCIUM 600+D) 600-200 MG-UNIT TABS Take by mouth daily after breakfast. Dr. Phylliss Bob        . Clobetasol Prop Oint-Coal Tar (CLOBETAPLUS OINTMENT EX) Apply topically 2 (two) times daily.        . diphenoxylate-atropine (LOMOTIL) 2.5-0.025 MG per tablet Take 2 tablets by mouth 4 (four) times daily as needed.        Marland Kitchen esomeprazole (NEXIUM) 40 MG capsule Take 1 capsule (40 mg total) by mouth daily.  30 capsule  11  . ESZOPICLONE 3 MG tablet TAKE 1 TABLET AT BEDTIME AS NEEDED FOR  SLEEP  30 tablet  0  . gabapentin (NEURONTIN) 600 MG tablet Take 600 mg by mouth 3 (three) times daily.        . hydrOXYzine (ATARAX) 10 MG tablet TAKE ONE TABLET EVERY SIX HOURS AS      NEEDED FOR ITCHING  50 tablet  11  . ibuprofen (ADVIL,MOTRIN) 400 MG tablet Take 400 mg by mouth 3 (three) times daily.        . Magnesium 250 MG TABS Take by mouth every morning. Dr. Phylliss Bob       . Multiple  Vitamin (MULTIVITAMIN) tablet Take 1 tablet by mouth daily.        . nortriptyline (PAMELOR) 25 MG capsule Take 2 capsules (50 mg total) by mouth at bedtime.  30 capsule  11  . PREMARIN 0.45 MG tablet TAKE ONE (1) TABLET EACH DAY  30 tablet  1  . Probiotic Product (ALIGN) 4 MG CAPS Take 4 mg by mouth daily.        . simvastatin (ZOCOR) 40 MG tablet Take 1 tablet (40 mg total) by mouth every evening.  30 tablet  11  . torsemide (DEMADEX) 10 MG tablet TAKE 1 TABLET EACH DAY  30 tablet  5  . ZETIA 10 MG tablet TAKE 1 TABLET EACH DAY  30 tablet  5  . Zoledronic Acid (RECLAST IV) Inject into the vein as directed.          Allergies  Allergen Reactions  . Alendronate Sodium     Severe chest pain similar to heart attack   . Cefuroxime Axetil   . Codeine Nausea And Vomiting  . Demerol Nausea And Vomiting  . Macrodantin   . Meperidine  Hcl   . Nitrofurantoin   . Other     Hismanal and Maprobamate  . Sulfonamide Derivatives     Patient Active Problem List  Diagnoses  . HYPOTHYROIDISM  . HYPERLIPIDEMIA  . ANEMIA  . GERD  . IRRITABLE BOWEL SYNDROME  . KNEE PAIN, RIGHT  . DEGENERATIVE DISC DISEASE, LUMBOSACRAL SPINE  . CERVICAL RADICULOPATHY  . BURSITIS, ACROMIOCLAVICULAR, LEFT  . FIBROMYALGIA  . OSTEOPENIA  . DIARRHEA, RECURRENT  . VERTEBRAL FRACTURE  . LUMBOSACRAL STRAIN, ACUTE  . CAROTID BRUIT  . Edema  . Palpitation  . Multinodular thyroid  . Palpitations    History  Smoking status  . Former Smoker -- 4 years  . Types: Cigarettes  . Quit date: 09/22/1990  Smokeless tobacco  . Never Used    History  Alcohol Use  . Yes    WINE WITH DINNER. 2 GLASSES RED WINE    Family History  Problem Relation Age of Onset  . Arthritis      family hx  . Cancer      family hx  . Stroke    . Mental illness    . Hyperlipidemia    . Hypertension    . Cancer Mother   . Stroke Father   . HIV Son     Aids    Review of Systems: Constitutional: no fever chills diaphoresis or  fatigue or change in weight.  Head and neck: no hearing loss, no epistaxis, no photophobia or visual disturbance. Respiratory: No cough, shortness of breath or wheezing. Cardiovascular: No chest pain peripheral edema, palpitations. Gastrointestinal: No abdominal distention, no abdominal pain, no change in bowel habits hematochezia or melena. Genitourinary: No dysuria, no frequency, no urgency, no nocturia. Musculoskeletal:No arthralgias, no back pain, no gait disturbance or myalgias. Neurological: No dizziness, no headaches, no numbness, no seizures, no syncope, no weakness, no tremors. Hematologic: No lymphadenopathy, no easy bruising. Psychiatric: No confusion, no hallucinations, no sleep disturbance.    Physical Exam: Filed Vitals:   11/10/11 1628  BP: 126/78  Pulse: 60   the general appearance reveals a well-developed well-nourished woman in no distress.Pupils equal and reactive.   Extraocular Movements are full.  There is no scleral icterus.  The mouth and pharynx are normal.  The neck is supple.  The carotids reveal a soft right carotid bruits.  The jugular venous pressure is normal.  The thyroid is not enlarged.  There is no lymphadenopathy.The chest is clear to percussion and auscultation. There are no rales or rhonchi. Expansion of the chest is symmetrical.  The precordium is quiet.  The first heart sound is normal.  The second heart sound is physiologically split.  There is no murmur gallop rub or click.  There is no abnormal lift or heave.  The abdomen is soft and nontender. Bowel sounds are normal. The liver and spleen are not enlarged. There Are no abdominal masses. There are no bruits.  Extremities reveal decreased strength and motion of the left hand.  There is 1+ edema of the right lower leg and ankle.  Pedal pulses are present. The skin is warm and dry.  There is no rash.      Assessment / Plan:  The patient is to continue same medication and work harder on weight  loss through low carbohydrate low-calorie diet.  Recheck in 6 months for followup office visit and EKG.

## 2011-11-10 NOTE — Assessment & Plan Note (Signed)
Because of her cervical radiculopathy as well as her right knee osteoarthritis she has not been able to get any exercise.  Her weight has gone up 4 pounds.

## 2011-11-19 DIAGNOSIS — M19049 Primary osteoarthritis, unspecified hand: Secondary | ICD-10-CM | POA: Diagnosis not present

## 2011-11-20 ENCOUNTER — Encounter: Payer: Self-pay | Admitting: Gastroenterology

## 2011-11-20 DIAGNOSIS — L570 Actinic keratosis: Secondary | ICD-10-CM | POA: Diagnosis not present

## 2011-11-20 DIAGNOSIS — D235 Other benign neoplasm of skin of trunk: Secondary | ICD-10-CM | POA: Diagnosis not present

## 2011-11-20 DIAGNOSIS — D485 Neoplasm of uncertain behavior of skin: Secondary | ICD-10-CM | POA: Diagnosis not present

## 2011-11-26 ENCOUNTER — Other Ambulatory Visit: Payer: Self-pay | Admitting: Cardiology

## 2011-11-26 DIAGNOSIS — I6529 Occlusion and stenosis of unspecified carotid artery: Secondary | ICD-10-CM

## 2011-11-28 DIAGNOSIS — H25099 Other age-related incipient cataract, unspecified eye: Secondary | ICD-10-CM | POA: Diagnosis not present

## 2011-12-01 ENCOUNTER — Encounter (INDEPENDENT_AMBULATORY_CARE_PROVIDER_SITE_OTHER): Payer: Medicare Other

## 2011-12-01 DIAGNOSIS — I6529 Occlusion and stenosis of unspecified carotid artery: Secondary | ICD-10-CM | POA: Diagnosis not present

## 2011-12-02 ENCOUNTER — Other Ambulatory Visit: Payer: Self-pay

## 2011-12-02 MED ORDER — PREMARIN 0.45 MG PO TABS: 0.4500 mg | ORAL_TABLET | ORAL | Status: DC

## 2011-12-03 ENCOUNTER — Other Ambulatory Visit: Payer: Self-pay | Admitting: Neurological Surgery

## 2011-12-03 DIAGNOSIS — M4712 Other spondylosis with myelopathy, cervical region: Secondary | ICD-10-CM | POA: Diagnosis not present

## 2011-12-05 ENCOUNTER — Encounter (HOSPITAL_COMMUNITY): Payer: Self-pay

## 2011-12-05 ENCOUNTER — Encounter (HOSPITAL_COMMUNITY): Payer: Self-pay | Admitting: Pharmacy Technician

## 2011-12-05 ENCOUNTER — Encounter (HOSPITAL_COMMUNITY)
Admission: RE | Admit: 2011-12-05 | Discharge: 2011-12-05 | Disposition: A | Discharge status: Home / Self Care | Payer: Medicare Other | Source: Ambulatory Visit | Attending: Neurological Surgery | Admitting: Neurological Surgery

## 2011-12-05 LAB — COMPREHENSIVE METABOLIC PANEL
ALT: 18 U/L (ref 0–35)
AST: 22 U/L (ref 0–37)
Albumin: 4.1 g/dL (ref 3.5–5.2)
Alkaline Phosphatase: 88 U/L (ref 39–117)
BUN: 22 mg/dL (ref 6–23)
CO2: 27 mEq/L (ref 19–32)
Calcium: 9.6 mg/dL (ref 8.4–10.5)
Chloride: 97 mEq/L (ref 96–112)
Creatinine, Ser: 0.79 mg/dL (ref 0.50–1.10)
GFR calc Af Amer: >90 mL/min (ref >90)
GFR calc non Af Amer: 79 mL/min — ABNORMAL LOW (ref >90)
Glucose, Bld: 102 mg/dL — ABNORMAL HIGH (ref 70–99)
Potassium: 3.7 mEq/L (ref 3.5–5.1)
Sodium: 136 mEq/L (ref 135–145)
Total Bilirubin: 0.2 mg/dL — ABNORMAL LOW (ref 0.3–1.2)
Total Protein: 7.3 g/dL (ref 6.0–8.3)

## 2011-12-05 LAB — TYPE AND SCREEN
ABO/RH(D): O POS
Antibody Screen: NEGATIVE

## 2011-12-05 LAB — CBC
HCT: 39.7 % (ref 36.0–46.0)
Hemoglobin: 13.7 g/dL (ref 12.0–15.0)
MCH: 31.2 pg (ref 26.0–34.0)
MCHC: 34.5 g/dL (ref 30.0–36.0)
MCV: 90.4 fL (ref 78.0–100.0)
Platelets: 253 10*3/uL (ref 150–400)
RBC: 4.39 MIL/uL (ref 3.87–5.11)
RDW: 12.9 % (ref 11.5–15.5)
WBC: 9.3 10*3/uL (ref 4.0–10.5)

## 2011-12-05 LAB — SURGICAL PCR SCREEN
MRSA, PCR: NEGATIVE
Staphylococcus aureus: NEGATIVE

## 2011-12-05 LAB — ABO/RH: ABO/RH(D): O POS

## 2011-12-05 NOTE — Pre-Procedure Instructions (Addendum)
Chelsea Hancock   12/05/2011   Your procedure is scheduled on:  March 19th, Tuesday   Report to [facility] at  11:00 AM  Call this number if you have problems the morning of surgery: [phone]   Remember:   Do not eat food:After Midnight Monday.  May have clear liquids: up to 4 Hours before arrival time -- 7:00 AM.  Clear liquids include soda, tea, black coffee, apple or grape juice, broth.   Take these medicines the morning of surgery with A SIP OF WATER: Zebeta, Nexium, Neurontin   Do not wear jewelry, make-up or nail polish.   Do not wear lotions, powders, or perfumes. You may wear deodorant.   Do not shave 48 hours prior to surgery.  Do not bring valuables to the hospital.   Contacts, dentures or bridgework may not be worn into surgery.   Leave suitcase in the car. After surgery it may be brought to your room.  For patients admitted to the hospital, checkout time is 11:00 AM the day of discharge.   Patients discharged the day of surgery will not be allowed to drive home.  Name and phone number of your driver: NA  Special Instructions: CHG Shower Use Special Wash: 1/2 bottle night before surgery and 1/2 bottle morning of surgery.   Please read over the following fact sheets that you were given: Pain Booklet, Coughing and Deep Breathing, Blood Transfusion Information and Surgical Site Infection Prevention

## 2011-12-08 MED ORDER — VANCOMYCIN HCL IN DEXTROSE 1-5 GM/200ML-% IV SOLN
1000.0000 mg | INTRAVENOUS | Status: AC
  Administered 2011-12-09: 1000 mg via INTRAVENOUS
  Filled 2011-12-08: qty 200

## 2011-12-09 ENCOUNTER — Inpatient Hospital Stay (HOSPITAL_COMMUNITY): Payer: Medicare Other | Admitting: Certified Registered"

## 2011-12-09 ENCOUNTER — Encounter (HOSPITAL_COMMUNITY): Payer: Self-pay

## 2011-12-09 ENCOUNTER — Inpatient Hospital Stay (HOSPITAL_COMMUNITY)
Admission: RE | Admit: 2011-12-09 | Discharge: 2011-12-13 | DRG: 472 | Disposition: A | Discharge status: Home Health | Payer: Medicare Other | Source: Ambulatory Visit | Attending: Neurological Surgery | Admitting: Neurological Surgery

## 2011-12-09 ENCOUNTER — Encounter (HOSPITAL_COMMUNITY): Payer: Self-pay | Admitting: Certified Registered"

## 2011-12-09 ENCOUNTER — Encounter (HOSPITAL_COMMUNITY)
Admission: RE | Disposition: A | Discharge status: Home Health | Payer: Self-pay | Source: Ambulatory Visit | Attending: Neurological Surgery

## 2011-12-09 ENCOUNTER — Inpatient Hospital Stay (HOSPITAL_COMMUNITY): Payer: Medicare Other

## 2011-12-09 DIAGNOSIS — I10 Essential (primary) hypertension: Secondary | ICD-10-CM

## 2011-12-09 DIAGNOSIS — M4712 Other spondylosis with myelopathy, cervical region: Principal | ICD-10-CM | POA: Diagnosis present

## 2011-12-09 DIAGNOSIS — Z981 Arthrodesis status: Secondary | ICD-10-CM | POA: Diagnosis not present

## 2011-12-09 DIAGNOSIS — M542 Cervicalgia: Secondary | ICD-10-CM | POA: Diagnosis not present

## 2011-12-09 DIAGNOSIS — R220 Localized swelling, mass and lump, head: Secondary | ICD-10-CM | POA: Diagnosis not present

## 2011-12-09 DIAGNOSIS — T8489XA Other specified complication of internal orthopedic prosthetic devices, implants and grafts, initial encounter: Secondary | ICD-10-CM | POA: Diagnosis not present

## 2011-12-09 DIAGNOSIS — Y832 Surgical operation with anastomosis, bypass or graft as the cause of abnormal reaction of the patient, or of later complication, without mention of misadventure at the time of the procedure: Secondary | ICD-10-CM | POA: Diagnosis present

## 2011-12-09 DIAGNOSIS — Z01812 Encounter for preprocedural laboratory examination: Secondary | ICD-10-CM

## 2011-12-09 DIAGNOSIS — R1310 Dysphagia, unspecified: Secondary | ICD-10-CM | POA: Diagnosis not present

## 2011-12-09 SURGERY — ANTERIOR CERVICAL DECOMPRESSION/DISCECTOMY FUSION 4 LEVELS
Anesthesia: General | Site: Neck | Wound class: Clean

## 2011-12-09 MED ORDER — BISOPROLOL FUMARATE 5 MG PO TABS
5.0000 mg | ORAL_TABLET | Freq: Every day | ORAL | Status: DC
  Administered 2011-12-09 – 2011-12-12 (×4): 5 mg via ORAL
  Filled 2011-12-09 (×5): qty 1

## 2011-12-09 MED ORDER — ONDANSETRON HCL 4 MG/2ML IJ SOLN
4.0000 mg | Freq: Once | INTRAMUSCULAR | Status: AC | PRN
  Administered 2011-12-09: 4 mg via INTRAVENOUS

## 2011-12-09 MED ORDER — GABAPENTIN 600 MG PO TABS
600.0000 mg | ORAL_TABLET | Freq: Three times a day (TID) | ORAL | Status: DC
  Administered 2011-12-09 – 2011-12-13 (×11): 600 mg via ORAL
  Filled 2011-12-09 (×13): qty 1

## 2011-12-09 MED ORDER — LIDOCAINE-EPINEPHRINE 1 %-1:100000 IJ SOLN
INTRAMUSCULAR | Status: DC | PRN
  Administered 2011-12-09: 8 mL

## 2011-12-09 MED ORDER — ACETAMINOPHEN 650 MG RE SUPP: 650.0000 mg | RECTAL | Status: DC | PRN

## 2011-12-09 MED ORDER — PROMETHAZINE HCL 25 MG/ML IJ SOLN
INTRAMUSCULAR | Status: AC
  Administered 2011-12-09: 6.25 mg via INTRAVENOUS
  Filled 2011-12-09: qty 1

## 2011-12-09 MED ORDER — SENNA 8.6 MG PO TABS
1.0000 | ORAL_TABLET | Freq: Two times a day (BID) | ORAL | Status: DC
  Administered 2011-12-09 – 2011-12-12 (×7): 8.6 mg via ORAL
  Filled 2011-12-09 (×9): qty 1

## 2011-12-09 MED ORDER — MORPHINE SULFATE 4 MG/ML IJ SOLN
1.0000 mg | INTRAMUSCULAR | Status: DC | PRN
  Administered 2011-12-09: 4 mg via INTRAVENOUS
  Administered 2011-12-10 – 2011-12-11 (×2): 2 mg via INTRAVENOUS
  Administered 2011-12-11: 4 mg via INTRAVENOUS
  Filled 2011-12-09 (×5): qty 1

## 2011-12-09 MED ORDER — DIAZEPAM 5 MG PO TABS
5.0000 mg | ORAL_TABLET | Freq: Four times a day (QID) | ORAL | Status: DC | PRN
  Administered 2011-12-10 – 2011-12-13 (×3): 5 mg via ORAL
  Filled 2011-12-09 (×3): qty 1

## 2011-12-09 MED ORDER — LIDOCAINE HCL (CARDIAC) 20 MG/ML IV SOLN
INTRAVENOUS | Status: DC | PRN
  Administered 2011-12-09: 100 mg via INTRAVENOUS

## 2011-12-09 MED ORDER — FENTANYL CITRATE 0.05 MG/ML IJ SOLN
INTRAMUSCULAR | Status: DC | PRN
  Administered 2011-12-09: 50 ug via INTRAVENOUS
  Administered 2011-12-09 (×2): 100 ug via INTRAVENOUS

## 2011-12-09 MED ORDER — HYDROMORPHONE HCL PF 1 MG/ML IJ SOLN
INTRAMUSCULAR | Status: AC
  Administered 2011-12-09: 0.25 mg via INTRAVENOUS
  Filled 2011-12-09: qty 1

## 2011-12-09 MED ORDER — PHENOL 1.4 % MT LIQD: 1.0000 | OROMUCOSAL | Status: DC | PRN

## 2011-12-09 MED ORDER — OXYCODONE-ACETAMINOPHEN 5-325 MG PO TABS
1.0000 | ORAL_TABLET | ORAL | Status: DC | PRN
  Administered 2011-12-09: 1 via ORAL
  Administered 2011-12-10 – 2011-12-13 (×5): 2 via ORAL
  Filled 2011-12-09 (×5): qty 2

## 2011-12-09 MED ORDER — EZETIMIBE 10 MG PO TABS
10.0000 mg | ORAL_TABLET | Freq: Every day | ORAL | Status: DC
  Administered 2011-12-10 – 2011-12-12 (×3): 10 mg via ORAL
  Filled 2011-12-09 (×4): qty 1

## 2011-12-09 MED ORDER — PANTOPRAZOLE SODIUM 40 MG PO TBEC
80.0000 mg | DELAYED_RELEASE_TABLET | Freq: Every day | ORAL | Status: DC
  Administered 2011-12-10 – 2011-12-12 (×4): 80 mg via ORAL
  Filled 2011-12-09 (×2): qty 1
  Filled 2011-12-09: qty 2

## 2011-12-09 MED ORDER — ESTROGENS CONJUGATED 0.45 MG PO TABS
0.4500 mg | ORAL_TABLET | Freq: Every day | ORAL | Status: DC
Start: 2011-12-10 — End: 2011-12-13
  Administered 2011-12-10 – 2011-12-13 (×4): 0.45 mg via ORAL
  Filled 2011-12-09 (×4): qty 1

## 2011-12-09 MED ORDER — 0.9 % SODIUM CHLORIDE (POUR BTL) OPTIME
TOPICAL | Status: DC | PRN
  Administered 2011-12-09: 1000 mL

## 2011-12-09 MED ORDER — CITALOPRAM HYDROBROMIDE 10 MG PO TABS
10.0000 mg | ORAL_TABLET | ORAL | Status: DC
  Administered 2011-12-10 – 2011-12-13 (×4): 10 mg via ORAL
  Filled 2011-12-09 (×5): qty 1

## 2011-12-09 MED ORDER — HYDROMORPHONE HCL PF 1 MG/ML IJ SOLN
0.2500 mg | INTRAMUSCULAR | Status: DC | PRN
  Administered 2011-12-09 (×2): 0.25 mg via INTRAVENOUS
  Administered 2011-12-09 (×2): 0.5 mg via INTRAVENOUS

## 2011-12-09 MED ORDER — NEOSTIGMINE METHYLSULFATE 1 MG/ML IJ SOLN
INTRAMUSCULAR | Status: DC | PRN
  Administered 2011-12-09: 3 mg via INTRAVENOUS

## 2011-12-09 MED ORDER — BUPIVACAINE HCL (PF) 0.5 % IJ SOLN
INTRAMUSCULAR | Status: DC | PRN
  Administered 2011-12-09: 8 mL

## 2011-12-09 MED ORDER — MIDAZOLAM HCL 5 MG/5ML IJ SOLN
INTRAMUSCULAR | Status: DC | PRN
  Administered 2011-12-09: 2 mg via INTRAVENOUS

## 2011-12-09 MED ORDER — HYDROMORPHONE HCL PF 1 MG/ML IJ SOLN: 0.2500 mg | INTRAMUSCULAR | Status: DC | PRN

## 2011-12-09 MED ORDER — ROCURONIUM BROMIDE 100 MG/10ML IV SOLN
INTRAVENOUS | Status: DC | PRN
  Administered 2011-12-09: 50 mg via INTRAVENOUS

## 2011-12-09 MED ORDER — ONDANSETRON HCL 4 MG/2ML IJ SOLN: 4.0000 mg | Freq: Once | INTRAMUSCULAR | Status: DC | PRN

## 2011-12-09 MED ORDER — SODIUM CHLORIDE 0.9 % IJ SOLN
3.0000 mL | Freq: Two times a day (BID) | INTRAMUSCULAR | Status: DC
  Administered 2011-12-09 – 2011-12-12 (×7): 3 mL via INTRAVENOUS

## 2011-12-09 MED ORDER — PROMETHAZINE HCL 25 MG/ML IJ SOLN
6.2500 mg | Freq: Once | INTRAMUSCULAR | Status: AC
  Administered 2011-12-09: 6.25 mg via INTRAVENOUS

## 2011-12-09 MED ORDER — OXYCODONE-ACETAMINOPHEN 5-325 MG PO TABS
ORAL_TABLET | ORAL | Status: AC
  Filled 2011-12-09: qty 2

## 2011-12-09 MED ORDER — BACITRACIN 50000 UNITS IM SOLR
INTRAMUSCULAR | Status: AC
  Filled 2011-12-09: qty 1

## 2011-12-09 MED ORDER — EPHEDRINE SULFATE 50 MG/ML IJ SOLN
INTRAMUSCULAR | Status: DC | PRN
  Administered 2011-12-09 (×7): 10 mg via INTRAVENOUS

## 2011-12-09 MED ORDER — SODIUM CHLORIDE 0.9 % IR SOLN
Status: DC | PRN
  Administered 2011-12-09: 15:00:00

## 2011-12-09 MED ORDER — PROPOFOL 10 MG/ML IV EMUL
INTRAVENOUS | Status: DC | PRN
  Administered 2011-12-09: 170 mg via INTRAVENOUS

## 2011-12-09 MED ORDER — SUFENTANIL CITRATE 50 MCG/ML IV SOLN
INTRAVENOUS | Status: DC | PRN
  Administered 2011-12-09: 20 ug via INTRAVENOUS
  Administered 2011-12-09 (×3): 10 ug via INTRAVENOUS

## 2011-12-09 MED ORDER — HYDROMORPHONE HCL PF 1 MG/ML IJ SOLN
INTRAMUSCULAR | Status: AC
  Filled 2011-12-09: qty 1

## 2011-12-09 MED ORDER — SODIUM CHLORIDE 0.9 % IV SOLN: 250.0000 mL | INTRAVENOUS | Status: DC

## 2011-12-09 MED ORDER — ACETAMINOPHEN 325 MG PO TABS: 650.0000 mg | ORAL_TABLET | ORAL | Status: DC | PRN

## 2011-12-09 MED ORDER — BISOPROLOL FUMARATE 5 MG PO TABS: 5.0000 mg | ORAL_TABLET | Freq: Every day | ORAL | Status: DC

## 2011-12-09 MED ORDER — ONDANSETRON HCL 4 MG/2ML IJ SOLN
INTRAMUSCULAR | Status: DC | PRN
  Administered 2011-12-09: 4 mg via INTRAVENOUS

## 2011-12-09 MED ORDER — ONDANSETRON HCL 4 MG/2ML IJ SOLN
INTRAMUSCULAR | Status: AC
  Administered 2011-12-11: 4 mg via INTRAVENOUS
  Filled 2011-12-09: qty 2

## 2011-12-09 MED ORDER — LACTATED RINGERS IV SOLN
INTRAVENOUS | Status: DC | PRN
  Administered 2011-12-09 (×3): via INTRAVENOUS

## 2011-12-09 MED ORDER — NORTRIPTYLINE HCL 25 MG PO CAPS
50.0000 mg | ORAL_CAPSULE | Freq: Every day | ORAL | Status: DC
  Administered 2011-12-10 – 2011-12-12 (×3): 50 mg via ORAL
  Filled 2011-12-09 (×4): qty 2

## 2011-12-09 MED ORDER — SODIUM CHLORIDE 0.9 % IJ SOLN: 3.0000 mL | INTRAMUSCULAR | Status: DC | PRN

## 2011-12-09 MED ORDER — HYDROXYZINE HCL 10 MG PO TABS
10.0000 mg | ORAL_TABLET | ORAL | Status: DC
  Administered 2011-12-10 (×2): 20 mg via ORAL
  Administered 2011-12-10: 30 mg via ORAL
  Administered 2011-12-11: 20 mg via ORAL
  Administered 2011-12-11 (×2): 10 mg via ORAL
  Administered 2011-12-11: 30 mg via ORAL
  Administered 2011-12-12: 10 mg via ORAL
  Administered 2011-12-12: 20 mg via ORAL
  Administered 2011-12-12: 10 mg via ORAL
  Administered 2011-12-13: 30 mg via ORAL
  Filled 2011-12-09 (×28): qty 3

## 2011-12-09 MED ORDER — LIDOCAINE HCL 4 % MT SOLN
OROMUCOSAL | Status: DC | PRN
  Administered 2011-12-09: 4 mL via TOPICAL

## 2011-12-09 MED ORDER — SODIUM CHLORIDE 0.9 % IV SOLN
INTRAVENOUS | Status: AC
  Filled 2011-12-09: qty 500

## 2011-12-09 MED ORDER — THROMBIN 20000 UNITS EX KIT
PACK | CUTANEOUS | Status: DC | PRN
  Administered 2011-12-09: 15:00:00 via TOPICAL

## 2011-12-09 MED ORDER — ALUM & MAG HYDROXIDE-SIMETH 200-200-20 MG/5ML PO SUSP
30.0000 mL | Freq: Four times a day (QID) | ORAL | Status: DC | PRN
  Administered 2011-12-10: 30 mL via ORAL
  Filled 2011-12-09: qty 30

## 2011-12-09 MED ORDER — DEXAMETHASONE SODIUM PHOSPHATE 4 MG/ML IJ SOLN
INTRAMUSCULAR | Status: DC | PRN
  Administered 2011-12-09: 10 mg via INTRAVENOUS

## 2011-12-09 MED ORDER — MENTHOL 3 MG MT LOZG: 1.0000 | LOZENGE | OROMUCOSAL | Status: DC | PRN

## 2011-12-09 MED ORDER — GLYCOPYRROLATE 0.2 MG/ML IJ SOLN
INTRAMUSCULAR | Status: DC | PRN
  Administered 2011-12-09: 0.4 mg via INTRAVENOUS
  Administered 2011-12-09: 0.2 mg via INTRAVENOUS

## 2011-12-09 MED ORDER — ONDANSETRON HCL 4 MG/2ML IJ SOLN
4.0000 mg | INTRAMUSCULAR | Status: DC | PRN
  Administered 2011-12-11: 4 mg via INTRAVENOUS
  Filled 2011-12-09: qty 2

## 2011-12-09 SURGICAL SUPPLY — 68 items
Anterior Cervical Plate Assembly, 4 level, 64mm ×2 IMPLANT
Anterior Cervical Plate Assembly, 4-level, 68mm ×2 IMPLANT
BAG DECANTER FOR FLEXI CONT (MISCELLANEOUS) ×2 IMPLANT
BANDAGE GAUZE ELAST BULKY 4 IN (GAUZE/BANDAGES/DRESSINGS) ×4 IMPLANT
BIT DRILL 2.3 12 FIXED (INSTRUMENTS) ×1 IMPLANT
BIT DRILL NEURO 2X3.1 SFT TUCH (MISCELLANEOUS) ×1 IMPLANT
BONE CERVICAL 8MM (Neuro Prosthesis/Implant) ×4 IMPLANT
BUR BARREL STRAIGHT FLUTE 4.0 (BURR) ×2 IMPLANT
CAGE CERVICAL TRANZGRAFT 7MM (Cage) ×2 IMPLANT
CANISTER SUCTION 2500CC (MISCELLANEOUS) ×2 IMPLANT
CLOTH BEACON ORANGE TIMEOUT ST (SAFETY) ×2 IMPLANT
CONT SPEC 4OZ CLIKSEAL STRL BL (MISCELLANEOUS) ×2 IMPLANT
DECANTER SPIKE VIAL GLASS SM (MISCELLANEOUS) ×2 IMPLANT
DERMABOND ADVANCED (GAUZE/BANDAGES/DRESSINGS) ×1
DERMABOND ADVANCED .7 DNX12 (GAUZE/BANDAGES/DRESSINGS) ×1 IMPLANT
DRAPE LAPAROTOMY 100X72 PEDS (DRAPES) ×2 IMPLANT
DRAPE MICROSCOPE LEICA (MISCELLANEOUS) IMPLANT
DRAPE POUCH INSTRU U-SHP 10X18 (DRAPES) ×2 IMPLANT
DRAPE PROXIMA HALF (DRAPES) ×2 IMPLANT
DRESSING TELFA 8X3 (GAUZE/BANDAGES/DRESSINGS) IMPLANT
DRILL 12MM (INSTRUMENTS) ×2
DRILL NEURO 2X3.1 SOFT TOUCH (MISCELLANEOUS) ×2
DRSG OPSITE 4X5.5 SM (GAUZE/BANDAGES/DRESSINGS) IMPLANT
DURAPREP 6ML APPLICATOR 50/CS (WOUND CARE) ×2 IMPLANT
ELECT REM PT RETURN 9FT ADLT (ELECTROSURGICAL) ×2
ELECTRODE REM PT RTRN 9FT ADLT (ELECTROSURGICAL) ×1 IMPLANT
EVACUATOR 1/8 PVC DRAIN (DRAIN) ×2 IMPLANT
EVACUATOR SILICONE 100CC (DRAIN) ×2 IMPLANT
GAUZE SPONGE 4X4 16PLY XRAY LF (GAUZE/BANDAGES/DRESSINGS) ×4 IMPLANT
GLOVE BIOGEL M 8.0 STRL (GLOVE) ×2 IMPLANT
GLOVE BIOGEL PI IND STRL 7.5 (GLOVE) ×1 IMPLANT
GLOVE BIOGEL PI IND STRL 8 (GLOVE) ×1 IMPLANT
GLOVE BIOGEL PI IND STRL 8.5 (GLOVE) ×1 IMPLANT
GLOVE BIOGEL PI INDICATOR 7.5 (GLOVE) ×1
GLOVE BIOGEL PI INDICATOR 8 (GLOVE) ×1
GLOVE BIOGEL PI INDICATOR 8.5 (GLOVE) ×1
GLOVE ECLIPSE 7.5 STRL STRAW (GLOVE) ×8 IMPLANT
GLOVE ECLIPSE 8.5 STRL (GLOVE) ×2 IMPLANT
GLOVE EXAM NITRILE LRG STRL (GLOVE) IMPLANT
GLOVE EXAM NITRILE MD LF STRL (GLOVE) ×2 IMPLANT
GLOVE EXAM NITRILE XL STR (GLOVE) IMPLANT
GLOVE EXAM NITRILE XS STR PU (GLOVE) IMPLANT
GOWN BRE IMP SLV AUR LG STRL (GOWN DISPOSABLE) IMPLANT
GOWN BRE IMP SLV AUR XL STRL (GOWN DISPOSABLE) ×6 IMPLANT
GOWN STRL REIN 2XL LVL4 (GOWN DISPOSABLE) ×6 IMPLANT
HEAD HALTER (SOFTGOODS) ×2 IMPLANT
KIT BASIN OR (CUSTOM PROCEDURE TRAY) ×2 IMPLANT
KIT ROOM TURNOVER OR (KITS) ×2 IMPLANT
NEEDLE HYPO 22GX1.5 SAFETY (NEEDLE) ×2 IMPLANT
NEEDLE SPNL 22GX3.5 QUINCKE BK (NEEDLE) ×6 IMPLANT
NS IRRIG 1000ML POUR BTL (IV SOLUTION) ×2 IMPLANT
PACK LAMINECTOMY NEURO (CUSTOM PROCEDURE TRAY) ×2 IMPLANT
PAD ARMBOARD 7.5X6 YLW CONV (MISCELLANEOUS) ×6 IMPLANT
PUTTY DBM 5CC ×2 IMPLANT
RUBBERBAND STERILE (MISCELLANEOUS) IMPLANT
SCREW 12MM (Screw) ×2 IMPLANT
SCREW 14MM (Screw) ×21 IMPLANT
SCREW BN 14X4.5XST VA (Screw) ×1 IMPLANT
SPONGE GAUZE 4X4 12PLY (GAUZE/BANDAGES/DRESSINGS) ×2 IMPLANT
SPONGE INTESTINAL PEANUT (DISPOSABLE) ×2 IMPLANT
SPONGE SURGIFOAM ABS GEL 100 (HEMOSTASIS) ×2 IMPLANT
SUT ETHILON 3 0 FSL (SUTURE) ×2 IMPLANT
SUT VIC AB 3-0 SH 8-18 (SUTURE) ×4 IMPLANT
SYR 20ML ECCENTRIC (SYRINGE) ×2 IMPLANT
TAPE CLOTH SURG 4X10 WHT LF (GAUZE/BANDAGES/DRESSINGS) ×2 IMPLANT
TOWEL OR 17X24 6PK STRL BLUE (TOWEL DISPOSABLE) ×2 IMPLANT
TOWEL OR 17X26 10 PK STRL BLUE (TOWEL DISPOSABLE) ×2 IMPLANT
WATER STERILE IRR 1000ML POUR (IV SOLUTION) ×2 IMPLANT

## 2011-12-09 NOTE — Anesthesia Preprocedure Evaluation (Signed)
Anesthesia Evaluation  Patient identified by MRN, date of birth, ID band Patient awake    Reviewed: Allergy & Precautions, H&P , NPO status , Patient's Chart, lab work & pertinent test results  Airway Mallampati: II TM Distance: >3 FB     Dental  (+) Teeth Intact   Pulmonary  breath sounds clear to auscultation        Cardiovascular Rhythm:Regular Rate:Normal     Neuro/Psych    GI/Hepatic   Endo/Other    Renal/GU      Musculoskeletal   Abdominal   Peds  Hematology   Anesthesia Other Findings   Reproductive/Obstetrics                           Anesthesia Physical Anesthesia Plan  ASA: III  Anesthesia Plan: General   Post-op Pain Management:    Induction: Intravenous  Airway Management Planned: Oral ETT  Additional Equipment:   Intra-op Plan:   Post-operative Plan: Extubation in OR  Informed Consent: I have reviewed the patients History and Physical, chart, labs and discussed the procedure including the risks, benefits and alternatives for the proposed anesthesia with the patient or authorized representative who has indicated his/her understanding and acceptance.   Dental advisory given  Plan Discussed with:   Anesthesia Plan Comments:         Anesthesia Quick Evaluation

## 2011-12-09 NOTE — H&P (Signed)
Chelsea Hancock is an 78 y.o. female.   Chief Complaint: Neck pain weakness in upper extremities with atrophy and intrinsics HPI: Patient has been followed for severe cervical spondylosis she's had previous anterior cervical decompression arthrodesis at C3-4 which healed well without previous to that she had anterior decompression arthrodesis C5-C6 which haven't resulted and pseudoarthrosis she now has severe spondylosis at multiple levels including C5-C6 and C6-C7 and C7-T1 revision anterior decompression arthrodesis is being planned. Chelsea Hancock  #[id number]  DOB:  1934-01-24  12/03/2011:  Chelsea Hancock returns to the office today.  She has been seen by Dr. Onalee Hua who assessed her peripheral nerve function, particularly as it regards her ulnar and median neuropathies.  He was quite concise in his evaluation suggesting that no distal therapy should be undertaken as he feels that most of this is a diffuse process related to a myelopathy.  I would tend to agree and in reviewing Chelsea Hancock's situation, I again came to the conclusion that Chelsea Hancock may require two surgeries.  The first should be an anterior decompression and reconstruction from C4 down to T1.  If need be, this may require posterior stabilization.  It all depends on the quality of the fixation that we can obtain via an anterior approach alone.  I had previously discussed the anterior surgery with Chelsea Hancock and she understands the nature of the procedure.  I noted that my biggest concern for her is regarding her swallowing function and I course her neurologic function in general.  We are attempting to preserve this as best we can, particularly as regards her intrinsic function in her hands.  She is ready to proceed with surgical intervention and we will make every effort to schedule at the earliest possible convenience.          Stefani Dama, M.D./[facility]  Chelsea Hancock #[id number]  DOB:  11/22/1933   April 08, 2011  HISTORY:     Chelsea Hancock is a 78 year old retired female, self-referred and a remote patient of Dr. Danielle Dess. She has had cervical spine surgery as well as lumbar spine surgery remotely in the past.  She had a cervical spine fusion in 2003 and 2005. 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  She has done fairly well over the course of time regarding her cervical spine. She does not suffer from any cervical spine pain but in the last month she noticeably has lost use of her hands particularly the left where she is quite weak.  She has been dropping things, has fine motor skill problems and she was concerned that she has had a recurrence of problems in her cervical spine.  She has had intermittent lumbar spine epidurals and fusion in the past and she is stable from her lumbar spine point of view.  Most concerning is the disuse of the left hand.  She complains of numbness and tingling particularly in the ulnar nerve distribution.  She has never had any surgery on the ulnar nerve.  She has had the two levels in her cervical spine fused and she still has some balance problems which have not progressed since her original myelopathic incidence in the cervical spine.    PAST MEDICAL HISTORY:   Positive for fibromyalgia, hypercholesterolemia, osteoporosis, gastroesophageal reflux disease.  FAMILY HISTORY:    Mother died at age 59 from stomach cancer.  Father died at age 35 from a stroke.    PAST SURGICAL HISTORY:   ACDF 2003 and  2005 by Dr. Danielle Dess.  Hysterectomy 1981.  Laminectomy 1960's.  Low back fusion in 1997.  Hip replacement 2012.  Vein stripping in 1970's.  DRUG ALLERGIES:    FOSAMAX, SULFA, MACRODANTIN, DEMEROL, CEFTIN, HISMANAL, MEPROBAMATE AND CODEINE.    CURRENT MEDICATIONS:   Calcium, magnesium, Premarin, Citalopram, Torsemide, Nexium, Nortriptyline, Bisoprolol, Simvastatin, Zetia, Lunesta, Reclast, Gabapentin, Hydroxyzine, Hyoscyamine, Align and baby aspirin, 2 ibuprofen  t.i.d. for hip pain.  SOCIAL HISTORY:    Negative tobacco use, positive alcohol use wine with dinner. No history of substance abuse.  5'1" tall, 157 pounds.    REVIEW OF SYSTEMS:   Times 14 is positive for glasses, irregular pulse, heart murmur, hypercholesterolemia, slight incontinence, arthritis pain,   PHYSICAL EXAMINATION:   Chelsea Hancock ambulates slowly with a cane.  She has no cervical spine pain and good range of motion.  She has well healed surgical incision.  Upper extremities: motor strength is 5/5 to biceps, triceps and deltoid.  She has 4-/5 grip strength left, 4/5 grip strength right.  Interosseous 4-/5 left, 4/5 right. Opposition 4-/5 left, 4/5 right.  Wasting in the first webspace severe left, moderate to severe right.  Decreased sensibility in the ulnar nerve distribution left greater than right and markedly positive Tinel's at the level of the elbow on the left, mildly on the right.  Reflexes are 2+ and symmetric.  Negative Hoffmann's.  RADIOGRAPHS:    Radiographs show substantial spondylitic changes C3-4, there appears to be some mild lucency around the screws and it has collapsed forward into kyphosis with substantial degeneration at C4-5 disc level.  C5-6 is also quite collapsed with hardware in good position and alignment.  There is no instability. Multilevel spondylosis.    IMPRESSION:     1. Multilevel spondylosis of the cervical spine, severe with ACDF C3-4 and C5-6 in 2003 and 2005 respectively for cervical myelopathy with recent onset of weakness in the hands with probable severe ulnar neuropathy, left greater than right, and cross-over symptomatology from cervical spine.   2. Stable lumbar spine spondylosis with previous lumbar spine fusion.    PLAN:      To further delineate her situation, we would like to get an EMG and nerve conduction study to rule out the severity of her ulnar neuropathy.  She has significant first webspace wasting on exam consistent with severe  ulnar neuropathy.  We are also going to need to get an MRI with and without contrast of the cervical spine to be sure that she is not having any more cord compression or problems particularly at the C6-7 or T7-T1 levels which would contribute to her symptomatology.  We will see her back after these studies with Dr. Danielle Dess.    All questions were encouraged, answered and addressed.  I did discuss with her that she may need a myelogram of her cervical spine.  She would prefer to avoid that and she would rather have an MRI to see if this gives [place] enough information.    The patient is seen today by Hardin Negus, PA-C in the office.    [facility]         Hardin Negus, PA-C       Supervised by Stefani Dama, M.D.  Past Medical History  Diagnosis Date  . Fibromyalgia   . Hyperlipidemia   . GERD (gastroesophageal reflux disease)   . Palpitation   . Carotid bruit     Right  . Edema   . Arthritis   .  Dysrhythmia     paroxysmal A fib, irregular heartbeat  . PONV (postoperative nausea and vomiting)     Past Surgical History  Procedure Date  . Abdominal hysterectomy 1981  . Laminectomy 1960  . Veins stripped 1970  . Total hip arthroplasty 2012    Left  . Carpal tunnel release     left, ulnar nerve release at elbow  . Cervical fusion   . Back surgery 1997    x 2    Family History  Problem Relation Age of Onset  . Arthritis      family hx  . Cancer      family hx  . Stroke    . Mental illness    . Hyperlipidemia    . Hypertension    . Cancer Mother   . Stroke Father   . HIV Son     Aids  . Anesthesia problems Neg Hx    Social History:  reports that she quit smoking about 21 years ago. Her smoking use included Cigarettes. She quit after 4 years of use. She has never used smokeless tobacco. She reports that she drinks about 8.4 ounces of alcohol per week. She reports that she does not use illicit drugs.  Allergies:  Allergies  Allergen Reactions    . Alendronate Sodium Other (See Comments)    Severe chest pain similar to heart attack   . Cefuroxime Axetil Other (See Comments)    unknown  . Codeine Nausea And Vomiting  . Demerol Nausea And Vomiting  . Macrodantin   . Meperidine Hcl Nausea And Vomiting  . Nitrofurantoin Nausea And Vomiting  . Other Other (See Comments)    Hismanal and Maprobamate  portobello mushrooms - diarrhea  . Sulfonamide Derivatives Hives, Itching and Swelling    Medications Prior to Admission  Medication Dose Route Frequency Provider Last Rate Last Dose  . vancomycin (VANCOCIN) IVPB 1000 mg/200 mL premix  1,000 mg Intravenous 120 min pre-op Barnett Abu, MD       Medications Prior to Admission  Medication Sig Dispense Refill  . Biotin 5 MG CAPS Take 1 capsule by mouth daily.        . bisoprolol (ZEBETA) 5 MG tablet Take 5 mg by mouth daily at 2 PM daily at 2 PM.      . citalopram (CELEXA) 10 MG tablet Take 10 mg by mouth every morning.      Marland Kitchen esomeprazole (NEXIUM) 40 MG capsule Take 40 mg by mouth daily at 2 PM daily at 2 PM.      . gabapentin (NEURONTIN) 600 MG tablet Take 600 mg by mouth 3 (three) times daily.        Marland Kitchen ibuprofen (ADVIL,MOTRIN) 400 MG tablet Take 400 mg by mouth 3 (three) times daily.       . Magnesium 250 MG TABS Take by mouth every morning. Dr. Phylliss Bob       . nortriptyline (PAMELOR) 25 MG capsule Take 2 capsules (50 mg total) by mouth at bedtime.  30 capsule  11  . PREMARIN 0.45 MG tablet Take 1 tablet (0.45 mg total) by mouth as directed. Take daily for 21 days then do not take for 7 days.  21 tablet  2  . Probiotic Product (ALIGN) 4 MG CAPS Take 4 mg by mouth daily.        . simvastatin (ZOCOR) 40 MG tablet Take 1 tablet (40 mg total) by mouth every evening.  30 tablet  11  . Calcium  Carbonate-Vitamin D (CALCIUM 600+D) 600-200 MG-UNIT TABS Take by mouth daily after breakfast. Dr. Phylliss Bob        . Clobetasol Prop Oint-Coal Tar (CLOBETAPLUS OINTMENT EX) Apply topically 2 (two) times  daily.          No results found for this or any previous visit (from the past 48 hour(s)). No results found.  Review of Systems  Constitutional:       Weakness in hand bilaterally  HENT: Positive for neck pain.   Eyes: Negative.   Respiratory: Negative.   Cardiovascular: Negative.   Gastrointestinal: Negative.   Skin: Negative.   Neurological: Positive for tingling, sensory change, focal weakness and weakness. Negative for tremors, speech change, seizures and loss of consciousness.  Endo/Heme/Allergies: Negative.   Psychiatric/Behavioral: Negative.     Blood pressure 146/76, pulse 59, temperature 97.4 F (36.3 C), temperature source Oral, resp. rate 16, SpO2 97.00%. Physical Exam  Constitutional: She is oriented to person, place, and time. She appears well-developed and well-nourished.  HENT:  Head: Normocephalic and atraumatic.  Eyes: Conjunctivae and EOM are normal. Pupils are equal, round, and reactive to light.  Neck: No JVD present. No tracheal deviation present. No thyromegaly present.       Decreased range of motion to 50% of normal decreased flexion-extension also 50% of the  Cardiovascular: Normal rate and regular rhythm.   Respiratory: Effort normal and breath sounds normal.  GI: Soft. Bowel sounds are normal.  Musculoskeletal:       Marked atrophy and intrinsics weakness in triceps and finger extensors to 4/5 bilaterally  Lymphadenopathy:    She has cervical adenopathy.  Neurological: She is alert and oriented to person, place, and time. She has normal reflexes. No cranial nerve deficit. Coordination normal.  Skin: Skin is dry.  Psychiatric: She has a normal mood and affect. Her behavior is normal. Judgment and thought content normal.     Assessment/Plan Revision anterior cervical decompression arthrodesis C4 to T2 if possible.  Kamarie Palma J 12/09/2011, 1:34 PM

## 2011-12-09 NOTE — Op Note (Signed)
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 Asst.: Hilda Lias M.D., Lowry Bowl M.D. Indications: Patient is a 78 year old individual is said to previous anterior cervical decompression and arthrodesis of first and was a C3 for a number of years ago and the second one was at C5-6 approximately 6 years ago she developed a pseudoarthrosis at the level of C5-C6 and advanced spondylosis at C4 C5 with an anterolisthesis. She also developed severe C8 radiculopathy with atrophy and dysfunction in the intrinsic muscles of the hand. She's been advised regarding the need for surgical decompression down to T1 and possibly even T2.   Procedure: The patient was brought to the operating room placed on the table in supine position. After the smooth induction of general endotracheal anesthesia neck was placed in 5 pounds of halter traction and prepped with alcohol and DuraPrep. After sterile draping and appropriate timeout procedure a transverse incision was created in the left side of the neck and carried down to the platysma. The plane between the sternocleidomastoid and strap muscles dissected bluntly until the prevertebral space was reached. The areas of the old plates were then dissected and the inferior plate which wasn't Alphatec plate at U1-L2 was loosened and removed. Dissection was then carried cephalad and ventral osteophytes over the inferior margin of the plate at G4-W1 was uncovered and these were removed using a combination of osteotome  and a high-speed drill once the C3-C4 plate was removed a radiograph was obtained to identify the alignment of the spine and identify better the nature of the pseudoarthrosis it seemed that a cross C5 C6-1 large bone had grown ventrally and even with removal of the plate no motion could be identified between C5 and C6.. The dissection was then undertaken in the longus coli muscle to allow placement of a self-retaining Caspar type retractor.  The anterior longitudinal ligament was opened at C4 and ventral osteophytes were removed with a Leksell rongeur and Kerrison punch. Interspace was cleared of significant quantity of the degenerated disc material in the region of the posterior longitudinal ligament . Dissection was carried out using a high-speed drill and 3-0 Karlin curettes. Uncinate processes were drilled down and removed and osteophytes from the inferior margin of the body of C4 were removed with a Kerrison 2 mm gold punch. After the central canal and lateral recesses were well decompressed the stasis was achieved with the bipolar cautery and some small pledgets of Gelfoam soaked in thrombin that were later irrigated away.  An 8 mm transgraft was then prepared by enlarging the central cavity and filling with demineralized bone matrix and placing into the interspace. Attention was then turned to C6 C7 here the ventral osteophytes were noted the rather prominent and these were removed the disc space was entered and evacuated of a small quantity of severely degenerated and desiccated disc material in the region of the posterior longitudinal ligament self-retaining retractor to be placed and the lateral recesses were dissected free with uncinate spurs being removed from either uncinate process and the and the C7 nerve roots were decompressed well and a 7 mm transgraft was fitted to this space filled with demineralized bone  matrix. Attention was turned to C7 T1 here a discectomy is also created. The lateral  recesses were particularly stenotic more so on the right side than on the left side once these nerve roots were well decompressed an 8 mm transgraft was shaved the appropriate size and configuration and fitted into this interspace. This is also filled with demineralized bone matrix. Attention was turned T1 T2 here there was ventral osteophytes the once removed the disc space could not be entered a 0 substantial bony overgrowth and there is no motion across this disc space is felt that the peak 1 T2 interspace could not be entered partly because of the configuration of the patient's manubrium which did not allow direct visualization and also because it was already fused.   Next the retractor was removed and a 68 mm trestle plate was placed over the vertebral bodies and secured with 4 x 14 mm screws variable angle screws. A rescue screw was required on the left side at the C4 vertebrae and on the left side at C6 vertebrae the screw hole was stripped so no screw was placed. A final localizing radiograph identified the position of the surgical construct. Hemostasis  was achieved in the soft tissues and then the platysma was closed with 3-0 Vicryl in an interrupted fashion and 3-0 Vicryl was used in the subcuticular tissue. Blood loss was estimated at 150 cc. A Al Pimple drain was placed deep into the wound.

## 2011-12-09 NOTE — Progress Notes (Signed)
Subjective: Patient reports offers no complaints  Objective: Vital signs in last 24 hours: Temp:  [97.4 F (36.3 C)-98.6 F (37 C)] 97.9 F (36.6 C) (03/19 2145) Pulse Rate:  [59-79] 64  (03/19 2222) Resp:  [14-20] 16  (03/19 2222) BP: (120-156)/(51-76) 120/63 mmHg (03/19 2222) SpO2:  [94 %-97 %] 97 % (03/19 2222) Weight:  [73.3 kg (161 lb 9.6 oz)] 73.3 kg (161 lb 9.6 oz) (03/19 2200)  Intake/Output from previous day:   Intake/Output this shift: Total I/O In: 120 [P.O.:120] Out: 302 [Urine:250; Drains:52]  incision clean n dry. Drain with minimal output. motor function good in upper extremities. Nofocal weakmess.  Lab Results: No results found for this basename: WBC:2,HGB:2,HCT:2,PLT:2 in the last 72 hours BMET No results found for this basename: NA:2,K:2,CL:2,CO2:2,GLUCOSE:2,BUN:2,CREATININE:2,CALCIUM:2 in the last 72 hours  Studies/Results: Dg Cervical Spine 2-3 Views  12/09/2011  *RADIOLOGY REPORT*  Clinical Data: C4-T2 ACDF  CERVICAL SPINE - 2-3 VIEW  Comparison: CT cervical spine dated 08/26/2011  Findings: Initial lateral radiograph demonstrates anterior cervical fusion hardware at C3-4 and C5-6.  Additional radiographs demonstrate new anterior fusion hardware from C4 through T2.  Associated interbody fusion at two levels.  IMPRESSION: Intraoperative radiographs during C4-T2 ACDF with removal of prior C3-4 and C5-6 fusion hardware.  Original Report Authenticated By: Charline Bills, M.D.    Assessment/Plan: Stable post op. Mobilize in am.  LOS: 0 days  observe in icu overnight. monitor drainage airway and neuro statua.   Lois Slagel J 12/09/2011, 10:55 PM

## 2011-12-09 NOTE — Transfer of Care (Signed)
Immediate Anesthesia Transfer of Care Note  Patient: Chelsea Hancock  Procedure(s) Performed: Procedure(s) (LRB): ANTERIOR CERVICAL DECOMPRESSION/DISCECTOMY FUSION 4 LEVELS (N/A)  Patient Location: PACU  Anesthesia Type: General  Level of Consciousness: awake, alert  and confused  Airway & Oxygen Therapy: Patient Spontanous Breathing and Patient connected to nasal cannula oxygen  Post-op Assessment: Report given to PACU RN, Post -op Vital signs reviewed and stable and Patient moving all extremities X 4  Post vital signs: Reviewed and stable  Complications: No apparent anesthesia complications

## 2011-12-09 NOTE — Anesthesia Postprocedure Evaluation (Signed)
  Anesthesia Post-op Note  Patient: Chelsea Hancock  Procedure(s) Performed: Procedure(s) (LRB): ANTERIOR CERVICAL DECOMPRESSION/DISCECTOMY FUSION 4 LEVELS (N/A)  Patient Location: PACU  Anesthesia Type: General  Level of Consciousness: awake  Airway and Oxygen Therapy: Patient Spontanous Breathing and Patient connected to nasal cannula oxygen  Post-op Pain: mild  Post-op Assessment: Post-op Vital signs reviewed, Patient's Cardiovascular Status Stable, Respiratory Function Stable, Patent Airway and No signs of Nausea or vomiting  Post-op Vital Signs: Reviewed and stable  Complications: No apparent anesthesia complications

## 2011-12-10 ENCOUNTER — Encounter (HOSPITAL_COMMUNITY): Payer: Self-pay | Admitting: *Deleted

## 2011-12-10 MED ORDER — ESZOPICLONE 1 MG PO TABS
3.0000 mg | ORAL_TABLET | Freq: Every evening | ORAL | Status: DC | PRN
  Administered 2011-12-10 – 2011-12-12 (×3): 3 mg via ORAL
  Filled 2011-12-10 (×3): qty 3

## 2011-12-10 NOTE — Progress Notes (Signed)
UR COMPLETED  

## 2011-12-10 NOTE — Progress Notes (Signed)
Subjective: Patient reports Had significant neck and shoulder pain overnight currently feels fairly well is able swallow reasonably well. Denies any new numbness tingling or weakness in the upper extremities.  Objective: Vital signs in last 24 hours: Temp:  [97.3 F (36.3 C)-98.6 F (37 C)] 97.3 F (36.3 C) (03/20 0400) Pulse Rate:  [53-79] 75  (03/20 0600) Resp:  [11-20] 11  (03/20 0600) BP: (105-156)/(47-94) 112/47 mmHg (03/20 0600) SpO2:  [93 %-99 %] 99 % (03/20 0600) Weight:  [73.3 kg (161 lb 9.6 oz)] 73.3 kg (161 lb 9.6 oz) (03/19 2200)  Intake/Output from previous day: 03/19 0701 - 03/20 0700 In: 3060 [P.O.:360; I.V.:2700] Out: 902 [Urine:700; Drains:52; Blood:150] Intake/Output this shift:    Incision is clean and dry drainage with moderate output from drain motor function grip strength 4/5 intrinsic strength 4 minus out of 5 unchanged from preoperatively  Lab Results: No results found for this basename: WBC:2,HGB:2,HCT:2,PLT:2 in the last 72 hours BMET No results found for this basename: NA:2,K:2,CL:2,CO2:2,GLUCOSE:2,BUN:2,CREATININE:2,CALCIUM:2 in the last 72 hours  Studies/Results: Dg Cervical Spine 2-3 Views  12/09/2011  *RADIOLOGY REPORT*  Clinical Data: C4-T2 ACDF  CERVICAL SPINE - 2-3 VIEW  Comparison: CT cervical spine dated 08/26/2011  Findings: Initial lateral radiograph demonstrates anterior cervical fusion hardware at C3-4 and C5-6.  Additional radiographs demonstrate new anterior fusion hardware from C4 through T2.  Associated interbody fusion at two levels.  IMPRESSION: Intraoperative radiographs during C4-T2 ACDF with removal of prior C3-4 and C5-6 fusion hardware.  Original Report Authenticated By: Charline Bills, M.D.    Assessment/Plan: Stable postop day 1 status post 4 level anterior cervical revision surgery from C4-T1  LOS: 1 day  Leave drain in place, remove Foley catheter, transferred to 3000, start physical therapy and occupational therapy area  obtain good radiographs in department.   Julene Rahn J 12/10/2011, 9:00 AM

## 2011-12-10 NOTE — Plan of Care (Signed)
Problem: Consults Goal: Diagnosis - Spinal Surgery Cervical Spine Fusion     

## 2011-12-10 NOTE — Evaluation (Signed)
Occupational Therapy Evaluation Patient Details Name: Chelsea Hancock MRN: [id number] DOB: 08-07-1934 Today's Date: 12/10/2011  Problem List:  Patient Active Problem List  Diagnoses  . HYPOTHYROIDISM  . HYPERLIPIDEMIA  . ANEMIA  . GERD  . IRRITABLE BOWEL SYNDROME  . KNEE PAIN, RIGHT  . DEGENERATIVE DISC DISEASE, LUMBOSACRAL SPINE  . CERVICAL RADICULOPATHY  . BURSITIS, ACROMIOCLAVICULAR, LEFT  . FIBROMYALGIA  . OSTEOPENIA  . DIARRHEA, RECURRENT  . VERTEBRAL FRACTURE  . LUMBOSACRAL STRAIN, ACUTE  . CAROTID BRUIT  . Edema  . Palpitation  . Multinodular thyroid  . Palpitations    Past Medical History:  Past Medical History  Diagnosis Date  . Fibromyalgia   . Hyperlipidemia   . GERD (gastroesophageal reflux disease)   . Palpitation   . Carotid bruit     Right  . Edema   . Arthritis   . Dysrhythmia     paroxysmal A fib, irregular heartbeat  . PONV (postoperative nausea and vomiting)    Past Surgical History:  Past Surgical History  Procedure Date  . Abdominal hysterectomy 1981  . Laminectomy 1960  . Veins stripped 1970  . Total hip arthroplasty 2012    Left  . Carpal tunnel release     left, ulnar nerve release at elbow  . Cervical fusion   . Back surgery 1997    x 2    OT Assessment/Plan/Recommendation OT Assessment Clinical Impression Statement: 78 yo female s/p 4 level cervical fusion that could benefit from skilled OT services. Pt with frequent falls and could benefit from balance program and follow up with HHOT. Pt could progress to outpatient level for balance OT Recommendation/Assessment: Patient will need skilled OT in the acute care venue OT Problem List: Decreased strength;Decreased activity tolerance;Impaired balance (sitting and/or standing);Decreased safety awareness;Decreased knowledge of use of DME or AE;Decreased knowledge of precautions;Impaired UE functional use OT Therapy Diagnosis : Generalized weakness OT Plan OT Frequency: Min  2X/week OT Treatment/Interventions: Therapeutic exercise;DME and/or AE instruction;Therapeutic activities;Patient/family education;Balance training OT Recommendation Follow Up Recommendations: Home health OT Equipment Recommended: Defer to next venue Individuals Consulted Consulted and Agree with Results and Recommendations: Patient;Family member/caregiver OT Goals Acute Rehab OT Goals OT Goal Formulation: With patient Time For Goal Achievement: 2 weeks ADL Goals Pt Will Transfer to Toilet: with modified independence;Ambulation;3-in-1 ADL Goal: Toilet Transfer - Progress: Goal set today Pt Will Perform Toileting - Clothing Manipulation: with modified independence;Sitting on 3-in-1 or toilet ADL Goal: Toileting - Clothing Manipulation - Progress: Goal set today Pt Will Perform Toileting - Hygiene: with modified independence;Sit to stand from 3-in-1/toilet ADL Goal: Toileting - Hygiene - Progress: Goal set today Miscellaneous OT Goals Miscellaneous OT Goal #1: Pt will demostrate 2 home exercises for fine motor from fine motor exercise program handout. OT Goal: Miscellaneous Goal #1 - Progress: Goal set today Miscellaneous OT Goal #2: Pt will perform bed mobility Mod I as a precursor to adls at sink level OT Goal: Miscellaneous Goal #2 - Progress: Goal set today  OT Evaluation Precautions/Restrictions  Precautions Precautions:  (cervical) Required Braces or Orthoses: No Prior Functioning Home Living Lives With: Alone Type of Home: House Home Layout: One level Home Access: Stairs to enter Entrance Stairs-Rails: None (holds onto door) Entrance Stairs-Number of Steps: 2 Bathroom Shower/Tub: Tub/shower unit;Other (comment) (2 steps to get into tub) Bathroom Toilet: Handicapped height Bathroom Accessibility: Yes How Accessible: Accessible via walker Home Adaptive Equipment: Grab bars in shower;Walker - rolling;Bedside commode/3-in-1;Shower chair without back Additional Comments: Had  a  hip replacement in May. 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 ADL ADL Eating/Feeding: Performed;Set up Where Assessed - Eating/Feeding: Chair Grooming: Simulated;Wash/dry hands;Supervision/safety Lower Body Dressing: Simulated;Supervision/safety Where Assessed - Lower Body Dressing: Sitting, chair;Unsupported (able to reach feet without neck flexion) Toilet Transfer: Buyer, retail Method: Proofreader: Raised toilet seat with arms (or 3-in-1 over toilet) Toileting - Clothing Manipulation: Simulated;Supervision/safety Where Assessed - Toileting Clothing Manipulation: Sit to stand from 3-in-1 or toilet Toileting - Hygiene: Simulated;Supervision/safety Where Assessed - Toileting Hygiene: Sit to stand from 3-in-1 or toilet Equipment Used: Rolling walker (pt familiar with sock aide and declines need for it ) Ambulation Related to ADLs: Pt ambulating with Rt foot drop from previous back surg. Pt provided ace wrap to keep the Rt foot in neutral position. Pt has orthothic brace but due to injury to bones in foot does not fit properly at this time. Pt could benefit from custom fit orthothetic at d/c ADL Comments: pt has (A) of friend at d/c home. Pt with history of falls and could benefit from OT to address balance with ADLS. Pt currently using a plateform to step on then into the tub. Pt awaiting knee surg after this admission. Vision/Perception  Vision - History Baseline Vision: Wears glasses all the time Cognition Cognition Arousal/Alertness: Awake/alert Overall Cognitive Status: Appears within functional limits for tasks assessed Orientation Level: Oriented X4 Sensation/Coordination Sensation Light Touch: Impaired Detail Light Touch Impaired Details: Impaired RUE;Impaired LUE Coordination Gross  Motor Movements are Fluid and Coordinated: Yes Fine Motor Movements are Fluid and Coordinated: No Finger Nose Finger Test: pt with Lt> Rt deficits. Pt reports that Rt hand feels much better s/p surg. Pt reports dropping objects with Lt hand. Pt with gross grasp 4 out 5 MMT bil ue  Extremity Assessment RUE Assessment RUE Assessment: Exceptions to [facility] LUE Assessment LUE Assessment: Exceptions to [facility] Mobility  Bed Mobility Bed Mobility: No Transfers Transfers: Yes Sit to Stand: 5: Supervision;With upper extremity assist;With armrests;From chair/3-in-1 Stand to Sit: 5: Supervision;With upper extremity assist;To chair/3-in-1;With armrests Exercises   End of Session OT - End of Session Equipment Utilized During Treatment: Gait belt Activity Tolerance: Patient tolerated treatment well Patient left: in chair;with call bell in reach;with family/visitor present (friend) Nurse Communication: Other (comment) (limit lifting/ pulling / pushing due to cervical precautions) General Behavior During Session: [facility] for tasks performed Cognition: [facility] for tasks performed   Lucile Shutters 12/10/2011, 3:30 PM  Pager: [phone]

## 2011-12-10 NOTE — Evaluation (Signed)
Physical Therapy Evaluation Patient Details Name: Chelsea Hancock MRN: [id number] DOB: 1933-12-26 Today's Date: 12/10/2011  Problem List:  Patient Active Problem List  Diagnoses  . HYPOTHYROIDISM  . HYPERLIPIDEMIA  . ANEMIA  . GERD  . IRRITABLE BOWEL SYNDROME  . KNEE PAIN, RIGHT  . DEGENERATIVE DISC DISEASE, LUMBOSACRAL SPINE  . CERVICAL RADICULOPATHY  . BURSITIS, ACROMIOCLAVICULAR, LEFT  . FIBROMYALGIA  . OSTEOPENIA  . DIARRHEA, RECURRENT  . VERTEBRAL FRACTURE  . LUMBOSACRAL STRAIN, ACUTE  . CAROTID BRUIT  . Edema  . Palpitation  . Multinodular thyroid  . Palpitations    Past Medical History:  Past Medical History  Diagnosis Date  . Fibromyalgia   . Hyperlipidemia   . GERD (gastroesophageal reflux disease)   . Palpitation   . Carotid bruit     Right  . Edema   . Arthritis   . Dysrhythmia     paroxysmal A fib, irregular heartbeat  . PONV (postoperative nausea and vomiting)    Past Surgical History:  Past Surgical History  Procedure Date  . Abdominal hysterectomy 1981  . Laminectomy 1960  . Veins stripped 1970  . Total hip arthroplasty 2012    Left  . Carpal tunnel release     left, ulnar nerve release at elbow  . Cervical fusion   . Back surgery 1997    x 2    PT Assessment/Plan/Recommendation PT Assessment Clinical Impression Statement: 78 yo female s/p 4 level cervical fusion that could benefit from skilled PT services. Pt overal mobilizing well. Has had Rt. foot drop since previous back surgery and has AFO although secondary to a broken foot (which left her with a prominence) she can not tolerate weaing them. Recommend pt get custom fit AFO at some point for safety. Pt will have 24 hour supervision upon return home. Will need to assess stairs prior to D/C. *Evaluation delayed today secondary to awaiting clarification of need for neck brace when OOB. PT Recommendation/Assessment: Patient will need skilled PT in the acute care venue PT Problem  List: Decreased strength;Decreased range of motion;Decreased balance;Decreased mobility;Decreased knowledge of use of DME;Decreased knowledge of precautions;Impaired sensation Barriers to Discharge: None PT Therapy Diagnosis : Difficulty walking;Abnormality of gait;Generalized weakness;Acute pain PT Plan PT Frequency: Min 5X/week PT Treatment/Interventions: DME instruction;Gait training;Stair training;Functional mobility training;Therapeutic exercise;Therapeutic activities;Balance training;Neuromuscular re-education;Patient/family education PT Recommendation Follow Up Recommendations: Home health PT;Supervision/Assistance - 24 hour Equipment Recommended: None recommended by PT PT Goals  Acute Rehab PT Goals PT Goal Formulation: With patient Time For Goal Achievement: 7 days Pt will Roll Supine to Right Side: Independently PT Goal: Rolling Supine to Right Side - Progress: Goal set today Pt will Roll Supine to Left Side: Independently PT Goal: Rolling Supine to Left Side - Progress: Goal set today Pt will go Supine/Side to Sit: Independently PT Goal: Supine/Side to Sit - Progress: Goal set today Pt will go Sit to Supine/Side: Independently PT Goal: Sit to Supine/Side - Progress: Goal set today Pt will go Sit to Stand: with modified independence PT Goal: Sit to Stand - Progress: Goal set today Pt will Ambulate: >150 feet;with modified independence;with least restrictive assistive device PT Goal: Ambulate - Progress: Goal set today Pt will Go Up / Down Stairs: 1-2 stairs;with supervision;with least restrictive assistive device PT Goal: Up/Down Stairs - Progress: Goal set today  PT Evaluation Precautions/Restrictions  Precautions Precautions:  (cervical) Required Braces or Orthoses: No Prior Functioning  Home Living Lives With: Alone Type of Home: House Home Layout: One  level Home Access: Stairs to enter Entrance Stairs-Rails: None (holds onto door) Entrance Stairs-Number of Steps:  2 Bathroom Shower/Tub: Tub/shower unit;Other (comment) (2 steps to get into tub) Bathroom Toilet: Handicapped height Bathroom Accessibility: Yes How Accessible: Accessible via walker Home Adaptive Equipment: Grab bars in shower;Walker - rolling;Bedside commode/3-in-1;Shower chair without back Additional Comments: Had a hip replacement in May. 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 Arousal/Alertness: Awake/alert Overall Cognitive Status: Appears within functional limits for tasks assessed Orientation Level: Oriented X4 Sensation/Coordination Sensation Light Touch: Impaired Detail Light Touch Impaired Details: Impaired RUE;Impaired LUE;Impaired LLE;Impaired RLE Coordination Gross Motor Movements are Fluid and Coordinated: Yes Fine Motor Movements are Fluid and Coordinated: No Finger Nose Finger Test: pt with Lt> Rt deficits. Pt reports that Rt hand feels much better s/p surg. Pt reports dropping objects with Lt hand. Pt with gross grasp 4 out 5 MMT bil ue  Extremity Assessment RUE Assessment RUE Assessment: Exceptions to [facility] LUE Assessment LUE Assessment: Exceptions to [facility] RLE Assessment RLE Assessment: Exceptions to [facility] RLE Strength RLE Overall Strength: Deficits RLE Overall Strength Comments: Generalized deconditioning, grossly >3+/5 with exception of Rt. dorsiflexors which are barely able to move against gravity. LLE Assessment LLE Assessment: Exceptions to [facility] LLE Strength LLE Overall Strength: Deficits LLE Overall Strength Comments: Generalized deconditioning, grossly >3+/5  Mobility (including Balance) Bed Mobility Bed Mobility: No Transfers Sit to Stand: 5: Supervision;With upper extremity assist;With armrests;From chair/3-in-1 Sit to Stand Details (indicate cue type and reason): Pt slow with ascent secondary to  arthritic joints. No physical assist required. Cues for UE placement.  Stand to Sit: 5: Supervision;With upper extremity assist;To chair/3-in-1;With armrests Stand to Sit Details: Pt slow with ascent secondary to arthritic joints. No physical assist required. Cues for UE placement.  Ambulation/Gait Ambulation/Gait: Yes Ambulation/Gait Assistance: Other (comment) (min-guard assist) Ambulation/Gait Assistance Details (indicate cue type and reason): Min-guard assist for safety secondary to foot drop and mild gait imbalance. Pt needs RW at this time. Pt with evident Rt. foot drop (temporary AFO application with ACE wrap utilized for safety).  Ambulation Distance (Feet): 300 Feet Assistive device: Rolling walker Gait Pattern: Step-to pattern;Decreased stride length;Right steppage;Decreased dorsiflexion - right;Trunk flexed Stairs: No    End of Session PT - End of Session Equipment Utilized During Treatment: Gait belt Activity Tolerance: Patient tolerated treatment well Patient left: in chair;with call bell in reach;with family/visitor present Nurse Communication: Mobility status for ambulation General Behavior During Session: [facility] for tasks performed Cognition: [facility] for tasks performed  Wilhemina Bonito 12/10/2011, 4:21 PM  Sherie Don) Carleene Mains PT, DPT Acute Rehabilitation [phone]

## 2011-12-11 ENCOUNTER — Telehealth: Payer: Self-pay | Admitting: Cardiology

## 2011-12-11 ENCOUNTER — Inpatient Hospital Stay (HOSPITAL_COMMUNITY): Payer: Medicare Other

## 2011-12-11 MED ORDER — DEXAMETHASONE SODIUM PHOSPHATE 4 MG/ML IJ SOLN
8.0000 mg | Freq: Once | INTRAMUSCULAR | Status: AC
  Administered 2011-12-11: 8 mg via INTRAVENOUS
  Filled 2011-12-11: qty 2

## 2011-12-11 NOTE — Progress Notes (Signed)
PT Cancellation Note  Treatment cancelled today due to patient sound asleep and not able to participate in therapy.  Will re-attempt to see patient tomorrow.  Ezzard Standing SPT 12/11/2011, 3:00 PM

## 2011-12-11 NOTE — Progress Notes (Signed)
Agree with student PT cancellation note.  Gervis Gaba, PT DPT [phone]  

## 2011-12-11 NOTE — Telephone Encounter (Signed)
FU Call: Pt daughter returning call to office on behalf of pt; to speak with Juliette Alcide.Please return pt daughter call to discuss further.

## 2011-12-11 NOTE — Telephone Encounter (Signed)
Left message

## 2011-12-11 NOTE — Progress Notes (Signed)
Occupational Therapy Treatment Patient Details Name: Chelsea Hancock MRN: [id number] DOB: 08-Nov-1933 Today's Date: 12/11/2011  OT Assessment/Plan OT Assessment/Plan Comments on Treatment Session: Pt much more lethargic this session. Pt required min (A) to sit at EOB. Pt required increased (A) with mobility and decreased arousal compared to 12/10/11 evaluation. Rn Fuller [place]) and charge nurse (tracey) made aware of change in status and limited participation with therapy. OT Goals ADL Goals Pt Will Transfer to Toilet: with modified independence;Ambulation;3-in-1 ADL Goal: Toilet Transfer - Progress: Not progressing Pt Will Perform Toileting - Clothing Manipulation: with modified independence;Sitting on 3-in-1 or toilet ADL Goal: Toileting - Clothing Manipulation - Progress: Not progressing Pt Will Perform Toileting - Hygiene: with modified independence;Sit to stand from 3-in-1/toilet ADL Goal: Toileting - Hygiene - Progress: Not progressing Miscellaneous OT Goals Miscellaneous OT Goal #1: Pt will demostrate 2 home exercises for fine motor from fine motor exercise program handout. OT Goal: Miscellaneous Goal #1 - Progress: Not progressing  OT Treatment Precautions/Restrictions  Precautions Precautions: Other (comment) (cervical) Required Braces or Orthoses: No   ADL ADL Toilet Transfer: Performed;Moderate assistance Toilet Transfer Method: Stand pivot Toilet Transfer Equipment: Raised toilet seat with arms (or 3-in-1 over toilet) Toileting - Clothing Manipulation: Performed;Minimal assistance Where Assessed - Toileting Clothing Manipulation: Sit to stand from 3-in-1 or toilet Toileting - Hygiene: Performed;Minimal assistance Where Assessed - Toileting Hygiene: Sit to stand from 3-in-1 or toilet ADL Comments: Pt's caregiver educated on fine motor handout and return demo. Pt with decreased arousal and unable to participte Mobility  Bed Mobility Bed Mobility: Yes Rolling Right: 4:  Min assist Right Sidelying to Sit: 3: Mod assist;With rails Supine to Sit: 3: Mod assist;With rails Transfers Transfers: Yes Sit to Stand: 3: Mod assist;With upper extremity assist;From bed Stand to Sit: 3: Mod assist;With upper extremity assist;To bed;With armrests Exercises    End of Session OT - End of Session Activity Tolerance: Patient limited by fatigue Patient left: in bed;with call bell in reach;with family/visitor present Nurse Communication: Mobility status for transfers;Other (comment) (decreased attention to focused level) General Behavior During Session: [facility] for tasks performed Cognition: [facility] for tasks performed  Lucile Shutters  12/11/2011, 1:11 PM Pager: [phone]

## 2011-12-11 NOTE — Progress Notes (Signed)
Informed Dr.Elsner the DG spine result. Told no further scan is needed, ordered IV Decadron.

## 2011-12-11 NOTE — Progress Notes (Signed)
PT Cancellation Note  Treatment cancelled today due to patient with extreme lethargy. Attempted treatment from [phone]. Pt sat on EOB and was unable to remain awake. Will attempt treatment later pending medical stability.  Thanks, .Gary Fleet, Marely Apgar Lynn 12/11/2011, 2:57 PM

## 2011-12-11 NOTE — Telephone Encounter (Signed)
Message copied by Burnell Blanks on Thu Dec 11, 2011  2:30 PM ------      Message from: Cassell Clement      Created: Sat Dec 06, 2011  4:55 PM       Please report.  The carotid velocities have increased since the last study. Needs close followup. Repeat carotid duplex in 6 mo

## 2011-12-11 NOTE — Progress Notes (Signed)
Subjective: Patient reports Reports swallowing difficulties food seems to get hung up and throat requires several efforts to get food to swallow liquids likewise hands seem less numb.  Objective: Vital signs in last 24 hours: Temp:  [97.3 F (36.3 C)-98.7 F (37.1 C)] 98.2 F (36.8 C) (03/21 0629) Pulse Rate:  [51-167] 61  (03/21 0629) Resp:  [14-20] 18  (03/21 0629) BP: (107-129)/(51-96) 129/75 mmHg (03/21 0629) SpO2:  [94 %-99 %] 95 % (03/21 0629)  Intake/Output from previous day: 03/20 0701 - 03/21 0700 In: 290 [P.O.:290] Out: 445 [Urine:350; Drains:95] Intake/Output this shift:    Incision clean dry brain with much less output than yesterday neck appears flat no significant accumulations of fluid or substantial edema on surface  Lab Results: No results found for this basename: WBC:2,HGB:2,HCT:2,PLT:2 in the last 72 hours BMET No results found for this basename: NA:2,K:2,CL:2,CO2:2,GLUCOSE:2,BUN:2,CREATININE:2,CALCIUM:2 in the last 72 hours  Studies/Results: Dg Cervical Spine 2-3 Views  12/09/2011  *RADIOLOGY REPORT*  Clinical Data: C4-T2 ACDF  CERVICAL SPINE - 2-3 VIEW  Comparison: CT cervical spine dated 08/26/2011  Findings: Initial lateral radiograph demonstrates anterior cervical fusion hardware at C3-4 and C5-6.  Additional radiographs demonstrate new anterior fusion hardware from C4 through T2.  Associated interbody fusion at two levels.  IMPRESSION: Intraoperative radiographs during C4-T2 ACDF with removal of prior C3-4 and C5-6 fusion hardware.  Original Report Authenticated By: Charline Bills, M.D.    Assessment/Plan: Some swallowing difficulty status post C4-T1 anterior decompression arthrodesis. Revision surgery.  LOS: 2 days  Remove drain. AP and lateral x-ray of cervical spine. Re\re dose with Decadron 6 mg IV or by mouth today. Observe swallowing for next day or so. Rate for discharge   Chelsea Hancock 12/11/2011, 8:28 AM

## 2011-12-12 ENCOUNTER — Other Ambulatory Visit: Payer: Self-pay | Admitting: *Deleted

## 2011-12-12 DIAGNOSIS — I6529 Occlusion and stenosis of unspecified carotid artery: Secondary | ICD-10-CM

## 2011-12-12 MED ORDER — DEXAMETHASONE 1 MG PO TABS: ORAL_TABLET | ORAL | Status: DC

## 2011-12-12 MED ORDER — OXYCODONE-ACETAMINOPHEN 5-325 MG PO TABS: 1.0000 | ORAL_TABLET | ORAL | Status: AC | PRN

## 2011-12-12 MED ORDER — DIAZEPAM 5 MG PO TABS: 5.0000 mg | ORAL_TABLET | Freq: Four times a day (QID) | ORAL | Status: AC | PRN

## 2011-12-12 MED ORDER — DEXAMETHASONE SODIUM PHOSPHATE 4 MG/ML IJ SOLN
4.0000 mg | Freq: Once | INTRAMUSCULAR | Status: AC
  Administered 2011-12-12: 4 mg via INTRAVENOUS

## 2011-12-12 NOTE — Telephone Encounter (Signed)
Advised daughter, patient in hospital post cervical surgery

## 2011-12-12 NOTE — Progress Notes (Signed)
CARE MANAGEMENT NOTE 12/12/2011      Action/Plan:   Spoke with patient regarding discharge needs. Also contacted her daughter @ [phone].Patient will go to [place], [place] for recovery at [facility].   Anticipated DC Date:  12/13/2011   Anticipated DC Plan:  HOME W HOME HEALTH SERVICES      DC Planning Services  CM consult      [facility] Choice  HOME HEALTH   Choice offered to / List presented to:  C-1 Patient        HH arranged  HH-2 PT  HH-3 OT      Status of service:  Completed, signed off  Discharge Disposition:  HOME W HOME HEALTH SERVICES  Comments:  12/12/11 1430 Vance Peper, RN BSN Case Manager Patient will be going to [place], [place] - [address] phone:[phone]. Contacted [facility] in [place], [place], [phone] spoke with Clydie Braun. Faxed info to her @ [phone]. Spoke with patient's daughter to answer questions.

## 2011-12-12 NOTE — Discharge Summary (Signed)
Physician Discharge Summary  Patient ID: Chelsea Hancock MRN: [id number] DOB/AGE: [birth year] 76 y.o.  Admit date: 12/09/2011 Discharge date: 12/12/2011  Admission Diagnosis: Cervical spondylosis with myelopathy and radiculopathy C4-5 C5-6 C6-7 C7-T1 status post arthrodesis with pseudoarthrosis  Discharge Diagnoses: Cervical spondylosis with myelopathy and radiculopathy C4-5 C5-6 C6-7 C7-T1 status post arthrodesis C5-C6 with pseudoarthrosis. Active Problems:  * No active hospital problems. *    Discharged Condition: fair  Hospital Course: Patient was admitted to undergo surgical decompression at C4-5 C5-6 C6-7 C7-T1 per day and spondylosis. Patient had a previous arthrodesis which resulted in a deformity particularly across C4-5 he had a severe kyphotic deformity in this region had evidence of both spinal cord and nerve root compromise particularly she was experiencing weakness in the intrinsic muscles of the hand and C7-T1 was particularly affected in the lateral recesses and needed to undergo surgical decompression and stabilization. She tolerated her procedure well she has had some difficulties with swallowing and a drain was placed at the time of surgery was removed 24 hours after surgery her incision has remained clean and dry swallowing is slowly improving she is being placed on a Decadron taper at discharge  Consults: Physical therapy occupational therapy  Significant Diagnostic Studies: Cervical spine radiography  Treatments: surgery: Revision anterior decompression C4-5 see 67 C7-T1 anterior plate fixation V2-Z3 removal of plates from G6-4 and C5-6  Discharge Exam: Blood pressure 131/72, pulse 59, temperature 97.9 F (36.6 C), temperature source Oral, resp. rate 18, height 5\' 4"  (1.626 m), weight 73.3 kg (161 lb 9.6 oz), SpO2 87.00%. Incision is clean and dry neck has moderate swelling but no evidence of any tracheal deviation. Motor strength reveals 4-5 strength in the  intrinsic hand function biceps and triceps appear to have good and normal strength compared to preoperatively.  Disposition: Discharge home  Discharge Orders    Future Orders Please Complete By Expires   Diet - low sodium heart healthy      Increase activity slowly      Discharge instructions      Comments:   Okay to shower. Do not apply salves or appointments to incision. No heavy lifting with the upper extremities greater than 15 pounds. May resume driving when not requiring pain medication and patient feels comfortable with doing so.   Call MD for:  temperature >100.4      Call MD for:  severe uncontrolled pain      Call MD for:  redness, tenderness, or signs of infection (pain, swelling, redness, odor or green/yellow discharge around incision site)        Medication List  As of 12/12/2011 12:33 PM   TAKE these medications         ALIGN 4 MG Caps   Take 4 mg by mouth daily.      aspirin EC 81 MG tablet   Take 81 mg by mouth daily.      Biotin 5 MG Caps   Take 1 capsule by mouth daily.      bisoprolol 5 MG tablet   Commonly known as: ZEBETA   Take 5 mg by mouth daily at 2 PM daily at 2 PM.      Calcium 600+D 600-200 MG-UNIT Tabs   Generic drug: Calcium Carbonate-Vitamin D   Take by mouth daily after breakfast. Dr. Phylliss Bob        citalopram 10 MG tablet   Commonly known as: CELEXA   Take 10 mg by mouth every morning.  CLOBETAPLUS OINTMENT EX   Apply topically 2 (two) times daily.      diazepam 5 MG tablet   Commonly known as: VALIUM   Take 1 tablet (5 mg total) by mouth every 6 (six) hours as needed (Muscle spasm).      esomeprazole 40 MG capsule   Commonly known as: NEXIUM   Take 40 mg by mouth daily at 2 PM daily at 2 PM.      eszopiclone 3 MG Tabs   Generic drug: Eszopiclone   Take 3 mg by mouth at bedtime. Take immediately before bedtime      ezetimibe 10 MG tablet   Commonly known as: ZETIA   Take 10 mg by mouth at bedtime.      gabapentin 600 MG  tablet   Commonly known as: NEURONTIN   Take 600 mg by mouth 3 (three) times daily.      hydrOXYzine 10 MG tablet   Commonly known as: ATARAX/VISTARIL   Take 10-30 mg by mouth every 4 (four) hours.      ibuprofen 400 MG tablet   Commonly known as: ADVIL,MOTRIN   Take 400 mg by mouth 3 (three) times daily.      Magnesium 250 MG Tabs   Take by mouth every morning. Dr. Phylliss Bob      mulitivitamin with minerals Tabs   Take 1 tablet by mouth daily.      nortriptyline 25 MG capsule   Commonly known as: PAMELOR   Take 2 capsules (50 mg total) by mouth at bedtime.      oxyCODONE-acetaminophen 5-325 MG per tablet   Commonly known as: PERCOCET   Take 1-2 tablets by mouth every 4 (four) hours as needed for pain.      PREMARIN 0.45 MG tablet   Generic drug: estrogens (conjugated)   Take 1 tablet (0.45 mg total) by mouth as directed. Take daily for 21 days then do not take for 7 days.      simvastatin 40 MG tablet   Commonly known as: ZOCOR   Take 1 tablet (40 mg total) by mouth every evening.      torsemide 10 MG tablet   Commonly known as: DEMADEX   Take 10 mg by mouth every morning.           Follow-up Information    Follow up with Stefani Dama, MD. Schedule an appointment as soon as possible for a visit in 2 weeks. (Call Aram Beecham for appointment)    Contact information:   [address], [address] [place] [address] [phone]          Signed: Stefani Dama 12/12/2011, 12:33 PM

## 2011-12-12 NOTE — Discharge Instructions (Signed)
Home Health to be provided by [facility] in [place], [place]- [phone]

## 2011-12-12 NOTE — Progress Notes (Signed)
Orthopedic Tech Progress Note Patient Details:  Chelsea Hancock Sep 29, 1933 [id number]  Other Ortho Devices Type of Ortho Device: Other (comment) Ortho Device Location: Neck Ortho Device Interventions: Application   Noboru Bidinger T 12/12/2011, 12:19 PM

## 2011-12-12 NOTE — Progress Notes (Signed)
Physical Therapy Treatment Patient Details Name: Chelsea Hancock MRN: [id number] DOB: 1934/06/03 Today's Date: 12/12/2011  PT Assessment/Plan  PT - Assessment/Plan Comments on Treatment Session: Pt progressing well this session, little fatigue. Spoke with pt and daughter regarding safety upon d/c home. Will recommend RW at this time, may transition off pending progress. PT Plan: Discharge plan remains appropriate;Frequency remains appropriate PT Frequency: Min 5X/week Follow Up Recommendations: Home health PT;Supervision/Assistance - 24 hour Equipment Recommended: None recommended by PT PT Goals  Acute Rehab PT Goals PT Goal Formulation: With patient PT Goal: Rolling Supine to Right Side - Progress: Progressing toward goal PT Goal: Rolling Supine to Left Side - Progress: Progressing toward goal PT Goal: Supine/Side to Sit - Progress: Progressing toward goal PT Goal: Sit to Supine/Side - Progress: Progressing toward goal PT Goal: Sit to Stand - Progress: Progressing toward goal PT Goal: Ambulate - Progress: Progressing toward goal PT Goal: Up/Down Stairs - Progress: Progressing toward goal  PT Treatment Precautions/Restrictions  Precautions Precautions: Other (comment) (cervical) Required Braces or Orthoses: No Restrictions Weight Bearing Restrictions: No Mobility (including Balance) Bed Mobility Bed Mobility: Yes Supine to Sit: 5: Supervision Supine to Sit Details (indicate cue type and reason): VC for sequencing for safety with cervical precautions Transfers Transfers: Yes Sit to Stand: 4: Min assist;With upper extremity assist;From bed Sit to Stand Details (indicate cue type and reason): VC for proper hand placement to avoid pushing with UEs. Min assist for stability Stand to Sit: 4: Min assist;With upper extremity assist;To chair/3-in-1 Stand to Sit Details: VC for hand placement. Assist with controlled descent Ambulation/Gait Ambulation/Gait: Yes Ambulation/Gait  Assistance: 5: Supervision Ambulation/Gait Assistance Details (indicate cue type and reason): Supervision for safety secondary to pt with foot drop as well as UE pain and weakness. Pt able to increase R knee flexion to prevent right foot drag. VC throughout for sequencing and safety Ambulation Distance (Feet): 200 Feet Assistive device: Rolling walker Gait Pattern: Step-to pattern;Decreased stride length;Right steppage;Decreased dorsiflexion - right;Trunk flexed Gait velocity: decreased gait speed Stairs: Yes Stairs Assistance: 4: Min assist Stairs Assistance Details (indicate cue type and reason): VC for stair technique with RW and without. Pt more comfortable without RW but educated pt on safety with RW for stability. Stair Management Technique: Backwards;Step to pattern;With walker (holding onto "doorframe" forwards) Number of Stairs: 2     Exercise    End of Session PT - End of Session Equipment Utilized During Treatment: Gait belt Activity Tolerance: Patient tolerated treatment well Patient left: in chair;with call bell in reach;with family/visitor present Nurse Communication: Mobility status for ambulation General Behavior During Session: [facility] for tasks performed Cognition: [facility] for tasks performed  Milana Kidney 12/12/2011, 1:48 PM  12/12/2011 Milana Kidney DPT PAGER: [phone] OFFICE: [phone]

## 2011-12-12 NOTE — Progress Notes (Signed)
Subjective: Patient reports Swallowing better speech slightly less hoarse, feels better  Objective: Vital signs in last 24 hours: Temp:  [96.6 F (35.9 C)-99.1 F (37.3 C)] 97.9 F (36.6 C) (03/22 1102) Pulse Rate:  [51-63] 59  (03/22 1102) Resp:  [16-18] 18  (03/22 1102) BP: (107-158)/(59-72) 131/72 mmHg (03/22 1102) SpO2:  [87 %-95 %] 87 % (03/22 1102)  Intake/Output from previous day: 03/21 0701 - 03/22 0700 In: 243 [P.O.:240; I.V.:3] Out: -  Intake/Output this shift:    Incision clean and dry modest swelling in neck. Motor function appears to be improving modestly he reports better ability to grip with both upper extremity no tracheal deviation apparent  Lab Results: No results found for this basename: WBC:2,HGB:2,HCT:2,PLT:2 in the last 72 hours BMET No results found for this basename: NA:2,K:2,CL:2,CO2:2,GLUCOSE:2,BUN:2,CREATININE:2,CALCIUM:2 in the last 72 hours  Studies/Results: Dg Cervical Spine 2-3 Views  12/11/2011  *RADIOLOGY REPORT*  Clinical Data: Difficulty swallowing following fusion.  CERVICAL SPINE - 2-3 VIEW  Comparison: Intraoperative films 12/09/11.  CT myelogram 08/26/2011.  Findings: C4-T1 ACDF with anterior plating has been performed. The cervical plate at W0-9 has been removed.  The C3-4 fusion is solid.  The C5-6 plate has been removed and replaced with a C4-T1 construct.  Hardware intact.  Single C6 screw on the right. Surgical drain removed. Carotid bifurcation calcification on the right.  No visible pneumothorax.  2-3 cm of prevertebral soft tissue swelling is present, maximal at C6-7. A postsurgical hematoma or spinal fluid leak is not excluded. Consider CT neck with IV contrast for further evaluation.  IMPRESSION: Unremarkable surgical construct. Functional C3-T1 fusion, with anterior plating C4-T1.  2-3 cm of prevertebral soft tissue swelling.  Consider CT neck with IV contrast for further evaluation.  Original Report Authenticated By: Elsie Stain,  M.D.    Assessment/Plan: Slight improvement today's postop anterior decompression arthrodesis revision surgery from C4 to T1  LOS: 3 days  Decadron taper over several days. Plan discharge for a.m. okay to shower continue to monitor in hospital for now   Caster Fayette J 12/12/2011, 12:19 PM

## 2011-12-12 NOTE — Telephone Encounter (Signed)
FU Call: Pt daughter returning call to [place]. Please return pt daughter call to discuss further.

## 2011-12-13 NOTE — Progress Notes (Signed)
Discharge to home. Transported to car via wheelchair

## 2011-12-17 DIAGNOSIS — M6281 Muscle weakness (generalized): Secondary | ICD-10-CM | POA: Diagnosis not present

## 2011-12-17 DIAGNOSIS — IMO0001 Reserved for inherently not codable concepts without codable children: Secondary | ICD-10-CM | POA: Diagnosis not present

## 2011-12-17 DIAGNOSIS — M216X9 Other acquired deformities of unspecified foot: Secondary | ICD-10-CM | POA: Diagnosis not present

## 2011-12-17 DIAGNOSIS — Z5189 Encounter for other specified aftercare: Secondary | ICD-10-CM | POA: Diagnosis not present

## 2011-12-17 DIAGNOSIS — Z4789 Encounter for other orthopedic aftercare: Secondary | ICD-10-CM | POA: Diagnosis not present

## 2011-12-18 DIAGNOSIS — Z4789 Encounter for other orthopedic aftercare: Secondary | ICD-10-CM | POA: Diagnosis not present

## 2011-12-18 DIAGNOSIS — Z5189 Encounter for other specified aftercare: Secondary | ICD-10-CM | POA: Diagnosis not present

## 2011-12-18 DIAGNOSIS — M216X9 Other acquired deformities of unspecified foot: Secondary | ICD-10-CM | POA: Diagnosis not present

## 2011-12-18 DIAGNOSIS — M6281 Muscle weakness (generalized): Secondary | ICD-10-CM | POA: Diagnosis not present

## 2011-12-18 DIAGNOSIS — IMO0001 Reserved for inherently not codable concepts without codable children: Secondary | ICD-10-CM | POA: Diagnosis not present

## 2011-12-22 DIAGNOSIS — Z5189 Encounter for other specified aftercare: Secondary | ICD-10-CM | POA: Diagnosis not present

## 2011-12-22 DIAGNOSIS — M216X9 Other acquired deformities of unspecified foot: Secondary | ICD-10-CM | POA: Diagnosis not present

## 2011-12-22 DIAGNOSIS — M6281 Muscle weakness (generalized): Secondary | ICD-10-CM | POA: Diagnosis not present

## 2011-12-22 DIAGNOSIS — IMO0001 Reserved for inherently not codable concepts without codable children: Secondary | ICD-10-CM | POA: Diagnosis not present

## 2011-12-22 DIAGNOSIS — Z4789 Encounter for other orthopedic aftercare: Secondary | ICD-10-CM | POA: Diagnosis not present

## 2011-12-23 DIAGNOSIS — Z4789 Encounter for other orthopedic aftercare: Secondary | ICD-10-CM | POA: Diagnosis not present

## 2011-12-23 DIAGNOSIS — Z5189 Encounter for other specified aftercare: Secondary | ICD-10-CM | POA: Diagnosis not present

## 2011-12-23 DIAGNOSIS — IMO0001 Reserved for inherently not codable concepts without codable children: Secondary | ICD-10-CM | POA: Diagnosis not present

## 2011-12-23 DIAGNOSIS — M216X9 Other acquired deformities of unspecified foot: Secondary | ICD-10-CM | POA: Diagnosis not present

## 2011-12-23 DIAGNOSIS — M6281 Muscle weakness (generalized): Secondary | ICD-10-CM | POA: Diagnosis not present

## 2011-12-24 DIAGNOSIS — M6281 Muscle weakness (generalized): Secondary | ICD-10-CM | POA: Diagnosis not present

## 2011-12-24 DIAGNOSIS — IMO0001 Reserved for inherently not codable concepts without codable children: Secondary | ICD-10-CM | POA: Diagnosis not present

## 2011-12-24 DIAGNOSIS — M216X9 Other acquired deformities of unspecified foot: Secondary | ICD-10-CM | POA: Diagnosis not present

## 2011-12-24 DIAGNOSIS — Z5189 Encounter for other specified aftercare: Secondary | ICD-10-CM | POA: Diagnosis not present

## 2011-12-24 DIAGNOSIS — Z4789 Encounter for other orthopedic aftercare: Secondary | ICD-10-CM | POA: Diagnosis not present

## 2011-12-25 DIAGNOSIS — IMO0001 Reserved for inherently not codable concepts without codable children: Secondary | ICD-10-CM | POA: Diagnosis not present

## 2011-12-25 DIAGNOSIS — M6281 Muscle weakness (generalized): Secondary | ICD-10-CM | POA: Diagnosis not present

## 2011-12-25 DIAGNOSIS — Z5189 Encounter for other specified aftercare: Secondary | ICD-10-CM | POA: Diagnosis not present

## 2011-12-25 DIAGNOSIS — Z4789 Encounter for other orthopedic aftercare: Secondary | ICD-10-CM | POA: Diagnosis not present

## 2011-12-25 DIAGNOSIS — M216X9 Other acquired deformities of unspecified foot: Secondary | ICD-10-CM | POA: Diagnosis not present

## 2011-12-26 DIAGNOSIS — Z4789 Encounter for other orthopedic aftercare: Secondary | ICD-10-CM | POA: Diagnosis not present

## 2011-12-26 DIAGNOSIS — M216X9 Other acquired deformities of unspecified foot: Secondary | ICD-10-CM | POA: Diagnosis not present

## 2011-12-26 DIAGNOSIS — Z5189 Encounter for other specified aftercare: Secondary | ICD-10-CM | POA: Diagnosis not present

## 2011-12-26 DIAGNOSIS — M6281 Muscle weakness (generalized): Secondary | ICD-10-CM | POA: Diagnosis not present

## 2011-12-26 DIAGNOSIS — IMO0001 Reserved for inherently not codable concepts without codable children: Secondary | ICD-10-CM | POA: Diagnosis not present

## 2011-12-29 ENCOUNTER — Encounter: Payer: Self-pay | Admitting: Gastroenterology

## 2011-12-30 DIAGNOSIS — M216X9 Other acquired deformities of unspecified foot: Secondary | ICD-10-CM | POA: Diagnosis not present

## 2011-12-30 DIAGNOSIS — Z5189 Encounter for other specified aftercare: Secondary | ICD-10-CM | POA: Diagnosis not present

## 2011-12-30 DIAGNOSIS — IMO0001 Reserved for inherently not codable concepts without codable children: Secondary | ICD-10-CM | POA: Diagnosis not present

## 2011-12-30 DIAGNOSIS — Z4789 Encounter for other orthopedic aftercare: Secondary | ICD-10-CM | POA: Diagnosis not present

## 2011-12-30 DIAGNOSIS — M6281 Muscle weakness (generalized): Secondary | ICD-10-CM | POA: Diagnosis not present

## 2011-12-31 DIAGNOSIS — M6281 Muscle weakness (generalized): Secondary | ICD-10-CM | POA: Diagnosis not present

## 2011-12-31 DIAGNOSIS — IMO0001 Reserved for inherently not codable concepts without codable children: Secondary | ICD-10-CM | POA: Diagnosis not present

## 2011-12-31 DIAGNOSIS — M216X9 Other acquired deformities of unspecified foot: Secondary | ICD-10-CM | POA: Diagnosis not present

## 2011-12-31 DIAGNOSIS — Z5189 Encounter for other specified aftercare: Secondary | ICD-10-CM | POA: Diagnosis not present

## 2011-12-31 DIAGNOSIS — Z4789 Encounter for other orthopedic aftercare: Secondary | ICD-10-CM | POA: Diagnosis not present

## 2012-01-01 DIAGNOSIS — IMO0001 Reserved for inherently not codable concepts without codable children: Secondary | ICD-10-CM | POA: Diagnosis not present

## 2012-01-01 DIAGNOSIS — Z4789 Encounter for other orthopedic aftercare: Secondary | ICD-10-CM | POA: Diagnosis not present

## 2012-01-01 DIAGNOSIS — M216X9 Other acquired deformities of unspecified foot: Secondary | ICD-10-CM | POA: Diagnosis not present

## 2012-01-01 DIAGNOSIS — M6281 Muscle weakness (generalized): Secondary | ICD-10-CM | POA: Diagnosis not present

## 2012-01-01 DIAGNOSIS — Z5189 Encounter for other specified aftercare: Secondary | ICD-10-CM | POA: Diagnosis not present

## 2012-01-02 DIAGNOSIS — M216X9 Other acquired deformities of unspecified foot: Secondary | ICD-10-CM | POA: Diagnosis not present

## 2012-01-02 DIAGNOSIS — Z4789 Encounter for other orthopedic aftercare: Secondary | ICD-10-CM | POA: Diagnosis not present

## 2012-01-02 DIAGNOSIS — M6281 Muscle weakness (generalized): Secondary | ICD-10-CM | POA: Diagnosis not present

## 2012-01-02 DIAGNOSIS — Z5189 Encounter for other specified aftercare: Secondary | ICD-10-CM | POA: Diagnosis not present

## 2012-01-02 DIAGNOSIS — IMO0001 Reserved for inherently not codable concepts without codable children: Secondary | ICD-10-CM | POA: Diagnosis not present

## 2012-01-05 DIAGNOSIS — Z5189 Encounter for other specified aftercare: Secondary | ICD-10-CM | POA: Diagnosis not present

## 2012-01-05 DIAGNOSIS — IMO0001 Reserved for inherently not codable concepts without codable children: Secondary | ICD-10-CM | POA: Diagnosis not present

## 2012-01-05 DIAGNOSIS — Z4789 Encounter for other orthopedic aftercare: Secondary | ICD-10-CM | POA: Diagnosis not present

## 2012-01-05 DIAGNOSIS — M216X9 Other acquired deformities of unspecified foot: Secondary | ICD-10-CM | POA: Diagnosis not present

## 2012-01-05 DIAGNOSIS — M6281 Muscle weakness (generalized): Secondary | ICD-10-CM | POA: Diagnosis not present

## 2012-01-07 DIAGNOSIS — M542 Cervicalgia: Secondary | ICD-10-CM | POA: Diagnosis not present

## 2012-01-09 ENCOUNTER — Other Ambulatory Visit: Payer: Self-pay | Admitting: Family Medicine

## 2012-01-13 MED ORDER — CITALOPRAM HYDROBROMIDE 10 MG PO TABS: 10.0000 mg | ORAL_TABLET | Freq: Every day | ORAL | Status: DC

## 2012-01-13 NOTE — Telephone Encounter (Signed)
Addended by: Court Joy on: 01/13/2012 10:00 AM   Modules accepted: Orders

## 2012-01-14 DIAGNOSIS — R1310 Dysphagia, unspecified: Secondary | ICD-10-CM | POA: Diagnosis not present

## 2012-01-14 DIAGNOSIS — K219 Gastro-esophageal reflux disease without esophagitis: Secondary | ICD-10-CM | POA: Diagnosis not present

## 2012-01-14 DIAGNOSIS — R490 Dysphonia: Secondary | ICD-10-CM | POA: Diagnosis not present

## 2012-01-26 ENCOUNTER — Encounter: Payer: Self-pay | Admitting: Gastroenterology

## 2012-01-27 ENCOUNTER — Other Ambulatory Visit (HOSPITAL_COMMUNITY)
Admission: RE | Admit: 2012-01-27 | Discharge: 2012-01-27 | Disposition: A | Discharge status: Home / Self Care | Payer: Medicare Other | Source: Ambulatory Visit | Attending: Family Medicine | Admitting: Family Medicine

## 2012-01-27 ENCOUNTER — Ambulatory Visit (INDEPENDENT_AMBULATORY_CARE_PROVIDER_SITE_OTHER): Payer: Medicare Other | Admitting: Family Medicine

## 2012-01-27 ENCOUNTER — Encounter: Payer: Self-pay | Admitting: Family Medicine

## 2012-01-27 VITALS — BP 132/70 | HR 66 | Temp 97.6°F | Ht 63.0 in | Wt 159.4 lb

## 2012-01-27 DIAGNOSIS — R002 Palpitations: Secondary | ICD-10-CM

## 2012-01-27 DIAGNOSIS — M25569 Pain in unspecified knee: Secondary | ICD-10-CM

## 2012-01-27 DIAGNOSIS — G4700 Insomnia, unspecified: Secondary | ICD-10-CM

## 2012-01-27 DIAGNOSIS — N952 Postmenopausal atrophic vaginitis: Secondary | ICD-10-CM | POA: Diagnosis not present

## 2012-01-27 DIAGNOSIS — N951 Menopausal and female climacteric states: Secondary | ICD-10-CM

## 2012-01-27 DIAGNOSIS — D649 Anemia, unspecified: Secondary | ICD-10-CM

## 2012-01-27 DIAGNOSIS — IMO0002 Reserved for concepts with insufficient information to code with codable children: Secondary | ICD-10-CM

## 2012-01-27 DIAGNOSIS — Z124 Encounter for screening for malignant neoplasm of cervix: Secondary | ICD-10-CM | POA: Diagnosis not present

## 2012-01-27 DIAGNOSIS — E785 Hyperlipidemia, unspecified: Secondary | ICD-10-CM

## 2012-01-27 DIAGNOSIS — E039 Hypothyroidism, unspecified: Secondary | ICD-10-CM

## 2012-01-27 MED ORDER — ESZOPICLONE 3 MG PO TABS: 3.0000 mg | ORAL_TABLET | Freq: Every day | ORAL | Status: DC

## 2012-01-27 MED ORDER — TRIAMCINOLONE ACETONIDE 0.1 % EX CREA: TOPICAL_CREAM | Freq: Every day | CUTANEOUS | Status: DC | PRN

## 2012-01-27 MED ORDER — PREMARIN 0.45 MG PO TABS: 0.4500 mg | ORAL_TABLET | ORAL | Status: DC

## 2012-01-27 NOTE — Patient Instructions (Signed)
Lipid Blood Tests Blood lipids are fats found in the circulation. Cholesterol and triglycerides are the two main lipids measured for determining your risk of getting heart and blood vessel diseases. The cholesterol in the blood is attached to two different molecules called lipoproteins. HDL is the beneficial high density lipoprotein cholesterol which reduces coronary risk. Low density lipoprotein cholesterol, the LDL, carries an increased risk for heart and vascular disease when it is high. Most of the body's fat tissue is in the form of triglycerides. Medical conditions that elevate the blood triglyceride level include diabetes, thyroid, kidney, and liver diseases.  A lipid panel usually includes the total cholesterol, LDL, and HDL blood levels. For best results, you should fast overnight before the blood tests are drawn. The following risk factors are generally accepted for different lipids:  LDL - Below 100 means low risk, 130-160 borderline risk, 160-190 high risk, above 190 is considered very high risk.   HDL - Below 40 means high risk, 40-60 average risk, 60 and higher means a reduced risk for heart and blood vessel diseases.   Total cholesterol - Below 200 means low risk, 200-240 is borderline risk, above 240 is higher risk.   Triglycerides - Below 200 means low risk, 200-400 borderline risk, above 400 means increased risk.  Many factors contribute to lipid levels including genetics, diet, exercise, body fat and medications. Reducing saturated fats in the diet and increasing fiber with fruits, vegetables and whole grains have been shown to improve blood lipids. Drugs may be prescribed to lower lipids if diet and exercise alone do not help bring the cholesterol levels under control. Document Released: 10/16/2004 Document Revised: 08/28/2011 Document Reviewed: 09/08/2005 ExitCare Patient Information 2012 ExitCare  MegaRed caps daily Try Chelsea Hancock Astringent to cleanse as needed

## 2012-01-27 NOTE — Assessment & Plan Note (Addendum)
Vaginal itching externally, atrophic changes noted, given Triamcinolone cream to try daily prn. Pap taken today

## 2012-01-27 NOTE — Progress Notes (Signed)
Patient ID: Chelsea Hancock, female   DOB: [birth year], 78 y.o.   MRN: [id number] Chelsea Hancock [id number] 1933/12/13 01/27/2012      Progress Note-Follow Up  Subjective  Chief Complaint  Chief Complaint  Patient presents with  . Annual Exam    physical  . Gynecologic Exam    pap    HPI  Patient 78 year old Caucasian female who is in today for GYN exam. Overall she's doing well she does note some occasional itching. Over the last 10 months she's had 3 surgeries. She had an inferior cervical surgery. She had surgery as well. She also had a upper left arm ulnar nerve and carpal tunnel surgery done. The carpal tunnel and ulnar nerves her visual helpful. She thinks it is improving but she's having trouble the right knee now. The neck has been somewhat helpful. She has some hoarseness of been persistent since her neck surgery on Detrol LA focal cord she believes on the right. No fevers or chills. No recent illness, chest pain or palpitations, shortness of breath, GI or GU complaints otherwise noted  Past Medical History  Diagnosis Date  . Fibromyalgia   . Hyperlipidemia   . GERD (gastroesophageal reflux disease)   . Palpitation   . Carotid bruit     Right  . Edema   . Arthritis   . Dysrhythmia     paroxysmal A fib, irregular heartbeat  . PONV (postoperative nausea and vomiting)   . Hyperlipidemia 01/27/2012    Past Surgical History  Procedure Date  . Abdominal hysterectomy 1981  . Laminectomy 1960  . Veins stripped 1970  . Total hip arthroplasty 2012    Left  . Carpal tunnel release     left, ulnar nerve release at elbow  . Cervical fusion   . Back surgery 1997    x 2    Family History  Problem Relation Age of Onset  . Arthritis      family hx  . Cancer      family hx  . Stroke    . Mental illness    . Hyperlipidemia    . Hypertension    . Cancer Mother   . Stroke Father   . HIV Son     Aids  . Anesthesia problems Neg Hx     History   Social  History  . Marital Status: Divorced    Spouse Name: N/A    Number of Children: N/A  . Years of Education: N/A   Occupational History  . Not on file.   Social History Main Topics  . Smoking status: Former Smoker -- 4 years    Types: Cigarettes    Quit date: 09/22/1990  . Smokeless tobacco: Never Used  . Alcohol Use: 8.4 oz/week    14 Glasses of wine per week     WINE WITH DINNER. 2 GLASSES RED WINE  . Drug Use: No  . Sexually Active: Not on file   Other Topics Concern  . Not on file   Social History Narrative  . No narrative on file    Current Outpatient Prescriptions on File Prior to Visit  Medication Sig Dispense Refill  . aspirin EC 81 MG tablet Take 81 mg by mouth daily.      . Biotin 5 MG CAPS Take 1 capsule by mouth daily.        . bisoprolol (ZEBETA) 5 MG tablet Take 5 mg by mouth daily at 2 PM daily at 2 PM.      .  Calcium Carbonate-Vitamin D (CALCIUM 600+D) 600-200 MG-UNIT TABS Take by mouth daily after breakfast. Dr. Phylliss Bob        . citalopram (CELEXA) 10 MG tablet Take 1 tablet (10 mg total) by mouth daily.  30 tablet  3  . Clobetasol Prop Oint-Coal Tar (CLOBETAPLUS OINTMENT EX) Apply topically 2 (two) times daily.        Marland Kitchen esomeprazole (NEXIUM) 40 MG capsule Take 40 mg by mouth daily at 2 PM daily at 2 PM.      . ezetimibe (ZETIA) 10 MG tablet Take 10 mg by mouth at bedtime.      . gabapentin (NEURONTIN) 600 MG tablet Take 600 mg by mouth 3 (three) times daily.        . hydrOXYzine (ATARAX/VISTARIL) 10 MG tablet Take 10-30 mg by mouth every 4 (four) hours.      Marland Kitchen ibuprofen (ADVIL,MOTRIN) 400 MG tablet Take 400 mg by mouth 3 (three) times daily.       . Magnesium 250 MG TABS Take by mouth every morning. Dr. Phylliss Bob       . Multiple Vitamin (MULITIVITAMIN WITH MINERALS) TABS Take 1 tablet by mouth daily.      . nortriptyline (PAMELOR) 25 MG capsule Take 2 capsules (50 mg total) by mouth at bedtime.  30 capsule  11  . Probiotic Product (ALIGN) 4 MG CAPS Take 4 mg by  mouth daily.        . simvastatin (ZOCOR) 40 MG tablet Take 1 tablet (40 mg total) by mouth every evening.  30 tablet  11  . torsemide (DEMADEX) 10 MG tablet Take 10 mg by mouth every morning.       Marland Kitchen DISCONTD: PREMARIN 0.45 MG tablet Take 1 tablet (0.45 mg total) by mouth as directed. Take daily for 21 days then do not take for 7 days.  21 tablet  2    Allergies  Allergen Reactions  . Alendronate Sodium Other (See Comments)    Severe chest pain similar to heart attack   . Cefuroxime Axetil Other (See Comments)    unknown  . Codeine Nausea And Vomiting  . Demerol Nausea And Vomiting  . Macrodantin   . Meperidine Hcl Nausea And Vomiting  . Nitrofurantoin Nausea And Vomiting  . Other Other (See Comments)    Hismanal and Maprobamate  portobello mushrooms - diarrhea  . Sulfonamide Derivatives Hives, Itching and Swelling    Review of Systems  Review of Systems  Constitutional: Negative for fever, chills and malaise/fatigue.  HENT: Positive for neck pain. Negative for hearing loss, nosebleeds and congestion.        Hoarseness  Eyes: Negative for discharge.  Respiratory: Negative for cough, sputum production, shortness of breath and wheezing.   Cardiovascular: Negative for chest pain, palpitations and leg swelling.  Gastrointestinal: Negative for heartburn, nausea, vomiting, abdominal pain, diarrhea, constipation and blood in stool.  Genitourinary: Negative for dysuria, urgency, frequency and hematuria.  Musculoskeletal: Positive for back pain. Negative for myalgias and falls.  Skin: Negative for rash.  Neurological: Negative for dizziness, tremors, sensory change, focal weakness, loss of consciousness, weakness and headaches.  Endo/Heme/Allergies: Negative for polydipsia. Does not bruise/bleed easily.  Psychiatric/Behavioral: Negative for depression and suicidal ideas. The patient is not nervous/anxious and does not have insomnia.     Objective  BP 132/70  Pulse 66  Temp(Src)  97.6 F (36.4 C) (Temporal)  Ht 5\' 3"  (1.6 m)  Wt 159 lb 6.4 oz (72.303 kg)  BMI 28.24 kg/m2  SpO2 94%  Physical Exam  Physical Exam  Constitutional: She is oriented to person, place, and time and well-developed, well-nourished, and in no distress. No distress.  HENT:  Head: Normocephalic and atraumatic.  Right Ear: External ear normal.  Left Ear: External ear normal.  Nose: Nose normal.  Mouth/Throat: Oropharynx is clear and moist. No oropharyngeal exudate.  Eyes: Conjunctivae are normal. Pupils are equal, round, and reactive to light. Right eye exhibits no discharge. Left eye exhibits no discharge. No scleral icterus.  Neck: Normal range of motion. Neck supple. No thyromegaly present.  Cardiovascular: Normal rate, regular rhythm, normal heart sounds and intact distal pulses.   No murmur heard. Pulmonary/Chest: Effort normal and breath sounds normal. No respiratory distress. She has no wheezes. She has no rales.  Abdominal: Soft. Bowel sounds are normal. She exhibits no distension and no mass. There is no tenderness.  Genitourinary: Vagina normal, uterus normal, cervix normal, right adnexa normal and left adnexa normal. No vaginal discharge found.       Labia minora and majora external skin excoriated  Musculoskeletal: Normal range of motion. She exhibits no edema and no tenderness.  Lymphadenopathy:    She has no cervical adenopathy.  Neurological: She is alert and oriented to person, place, and time. She has normal reflexes. No cranial nerve deficit. Coordination normal.  Skin: Skin is warm and dry. No rash noted. She is not diaphoretic.  Psychiatric: Mood, memory and affect normal.    Lab Results  Component Value Date   TSH 3.09 12/05/2010   Lab Results  Component Value Date   WBC 9.3 12/05/2011   HGB 13.7 12/05/2011   HCT 39.7 12/05/2011   MCV 90.4 12/05/2011   PLT 253 12/05/2011   Lab Results  Component Value Date   CREATININE 0.79 12/05/2011   BUN 22 12/05/2011   NA  136 12/05/2011   K 3.7 12/05/2011   CL 97 12/05/2011   CO2 27 12/05/2011   Lab Results  Component Value Date   ALT 18 12/05/2011   AST 22 12/05/2011   ALKPHOS 88 12/05/2011   BILITOT 0.2* 12/05/2011   Lab Results  Component Value Date   CHOL 184 09/20/2009   Lab Results  Component Value Date   HDL 50.90 09/20/2009   Lab Results  Component Value Date   LDLCALC 97 09/20/2009   Lab Results  Component Value Date   TRIG 183.0* 09/20/2009   Lab Results  Component Value Date   CHOLHDL 4 09/20/2009     Assessment & Plan  Cervical cancer screening Vaginal itching externally, atrophic changes noted, given Triamcinolone cream to try daily prn. Pap taken today  VERTEBRAL FRACTURE Has had a recent anterior cervical operation with Dr Danielle Dess, unfortunately she suffered a vocal cord paralysis and is struggling with hoarseness is awaiting a referral to speech therapy  HYPOTHYROIDISM Adequately treated, thyroid surgery on hold for now  ANEMIA Resolved labs reviewed w/patient  Palpitations No c/o today  KNEE PAIN, RIGHT Has had hip surgery with Dr Despina Hick but is holding of for knee due to debility from previous surgeries  Hyperlipidemia Continue current meds, check labs in a couple months, add Krill oil

## 2012-01-27 NOTE — Assessment & Plan Note (Signed)
Continue current meds, check labs in a couple months, add Krill oil

## 2012-01-27 NOTE — Assessment & Plan Note (Signed)
Resolved labs reviewed w/patient

## 2012-01-27 NOTE — Assessment & Plan Note (Signed)
Has had a recent anterior cervical operation with Dr Danielle Dess, unfortunately she suffered a vocal cord paralysis and is struggling with hoarseness is awaiting a referral to speech therapy

## 2012-01-27 NOTE — Assessment & Plan Note (Signed)
Adequately treated, thyroid surgery on hold for now

## 2012-01-27 NOTE — Assessment & Plan Note (Signed)
Has had hip surgery with Dr Despina Hick but is holding of for knee due to debility from previous surgeries

## 2012-01-27 NOTE — Assessment & Plan Note (Signed)
No c/o today 

## 2012-02-02 DIAGNOSIS — M810 Age-related osteoporosis without current pathological fracture: Secondary | ICD-10-CM | POA: Diagnosis not present

## 2012-02-02 DIAGNOSIS — IMO0001 Reserved for inherently not codable concepts without codable children: Secondary | ICD-10-CM | POA: Diagnosis not present

## 2012-02-03 DIAGNOSIS — Z1231 Encounter for screening mammogram for malignant neoplasm of breast: Secondary | ICD-10-CM | POA: Diagnosis not present

## 2012-02-04 DIAGNOSIS — Z79899 Other long term (current) drug therapy: Secondary | ICD-10-CM | POA: Diagnosis not present

## 2012-02-10 ENCOUNTER — Encounter: Payer: Self-pay | Admitting: Family Medicine

## 2012-02-10 DIAGNOSIS — IMO0002 Reserved for concepts with insufficient information to code with codable children: Secondary | ICD-10-CM | POA: Diagnosis not present

## 2012-02-10 DIAGNOSIS — M1710 Unilateral primary osteoarthritis, unspecified knee: Secondary | ICD-10-CM | POA: Diagnosis not present

## 2012-02-10 DIAGNOSIS — M169 Osteoarthritis of hip, unspecified: Secondary | ICD-10-CM | POA: Diagnosis not present

## 2012-02-12 DIAGNOSIS — M810 Age-related osteoporosis without current pathological fracture: Secondary | ICD-10-CM | POA: Diagnosis not present

## 2012-02-17 DIAGNOSIS — M1710 Unilateral primary osteoarthritis, unspecified knee: Secondary | ICD-10-CM | POA: Diagnosis not present

## 2012-02-17 DIAGNOSIS — IMO0002 Reserved for concepts with insufficient information to code with codable children: Secondary | ICD-10-CM | POA: Diagnosis not present

## 2012-02-23 ENCOUNTER — Other Ambulatory Visit: Payer: Self-pay | Admitting: Family Medicine

## 2012-02-25 DIAGNOSIS — M1710 Unilateral primary osteoarthritis, unspecified knee: Secondary | ICD-10-CM | POA: Diagnosis not present

## 2012-02-25 DIAGNOSIS — IMO0002 Reserved for concepts with insufficient information to code with codable children: Secondary | ICD-10-CM | POA: Diagnosis not present

## 2012-02-26 ENCOUNTER — Ambulatory Visit (AMBULATORY_SURGERY_CENTER): Payer: Medicare Other | Admitting: *Deleted

## 2012-02-26 VITALS — Ht 64.0 in | Wt 157.0 lb

## 2012-02-26 DIAGNOSIS — Z1211 Encounter for screening for malignant neoplasm of colon: Secondary | ICD-10-CM

## 2012-02-26 MED ORDER — PEG-KCL-NACL-NASULF-NA ASC-C 100 G PO SOLR: ORAL | Status: DC

## 2012-03-11 ENCOUNTER — Ambulatory Visit (AMBULATORY_SURGERY_CENTER): Payer: Medicare Other | Admitting: Gastroenterology

## 2012-03-11 ENCOUNTER — Encounter: Payer: Self-pay | Admitting: Gastroenterology

## 2012-03-11 VITALS — BP 131/70 | HR 82 | Temp 96.6°F | Resp 20 | Ht 64.0 in | Wt 157.0 lb

## 2012-03-11 DIAGNOSIS — K633 Ulcer of intestine: Secondary | ICD-10-CM

## 2012-03-11 DIAGNOSIS — E7800 Pure hypercholesterolemia, unspecified: Secondary | ICD-10-CM | POA: Diagnosis not present

## 2012-03-11 DIAGNOSIS — Z86010 Personal history of colonic polyps: Secondary | ICD-10-CM

## 2012-03-11 DIAGNOSIS — Z1211 Encounter for screening for malignant neoplasm of colon: Secondary | ICD-10-CM | POA: Diagnosis not present

## 2012-03-11 DIAGNOSIS — D126 Benign neoplasm of colon, unspecified: Secondary | ICD-10-CM

## 2012-03-11 DIAGNOSIS — K589 Irritable bowel syndrome without diarrhea: Secondary | ICD-10-CM | POA: Diagnosis not present

## 2012-03-11 DIAGNOSIS — E039 Hypothyroidism, unspecified: Secondary | ICD-10-CM | POA: Diagnosis not present

## 2012-03-11 DIAGNOSIS — K219 Gastro-esophageal reflux disease without esophagitis: Secondary | ICD-10-CM | POA: Diagnosis not present

## 2012-03-11 DIAGNOSIS — R000 Tachycardia, unspecified: Secondary | ICD-10-CM | POA: Diagnosis not present

## 2012-03-11 MED ORDER — SODIUM CHLORIDE 0.9 % IV SOLN: 500.0000 mL | INTRAVENOUS | Status: DC

## 2012-03-11 NOTE — Patient Instructions (Addendum)

## 2012-03-11 NOTE — Progress Notes (Addendum)
Propofol given per [facility] CRNA Scope changed to 571 Pt has long, tortorous colon, turned to her back in order to reach the cecum

## 2012-03-11 NOTE — Op Note (Signed)
[facility] [address] [place], [place]  [address]  COLONOSCOPY PROCEDURE REPORT  PATIENT:  Chelsea Hancock, Chelsea Hancock  MR#:  [id number] BIRTHDATE:  February 21, 1934, 77 yrs. old  GENDER:  female ENDOSCOPIST:  Judie Petit T. Russella Dar, MD, [facility]  PROCEDURE DATE:  03/11/2012 PROCEDURE:  Colonoscopy with biopsy and snare polypectomy ASA CLASS:  Class II INDICATIONS:  1) surveillance and high-risk screening  2) history of pre-cancerous (adenomatous) colon polyps: 1991, 2008 MEDICATIONS:   MAC sedation, administered by CRNA, propofol (Diprivan) 300 mg IV DESCRIPTION OF PROCEDURE:   After the risks benefits and alternatives of the procedure were thoroughly explained, informed consent was obtained.  Digital rectal exam was performed and revealed external hemorrhoids.   The LB CF-H180AL [id number] and LB PCF-H180AL [id number] endoscope was introduced through the anus and advanced to the cecum, which was identified by both the appendix and ileocecal valve, limited by a tortuous colon.    The quality of the prep was good, using MoviPrep.  The instrument was then slowly withdrawn as the colon was fully examined. <<PROCEDUREIMAGES>> FINDINGS:  A sessile polyp was found in the mid transverse colon. It was 6 mm in size. Polyp was snared without cautery. Retrieval was successful.  An ulcer was found in the sigmoid colon. It was likely benign, non-bleeding and soft. It was 5 mm in size. Multiple biopsies were obtained and sent to pathology.  Moderate diverticulosis was found in the sigmoid to descending colon. Otherwise normal colonoscopy without other polyps, masses, vascular ectasias, or inflammatory changes.  Retroflexed views in the rectum revealed internal hemorrhoids, small.  The time to cecum = 11  minutes. The scope was then withdrawn (time =  12  min) from the patient and the procedure completed.  COMPLICATIONS:  None  ENDOSCOPIC IMPRESSION: 1) 6 mm sessile polyp in the mid transverse colon 2)  5 mm ulcer in the sigmoid colon 3) Moderate diverticulosis in the sigmoid to descending colon 4) Internal and external hemorrhoids  RECOMMENDATIONS: 1) Await pathology results 2) High fiber diet with liberal fluid intake. 3) Given your age, you will not need another colonoscopy for colon cancer screening or polyp surveillance. These types of tests usually stop around the age 90.  Venita Lick. Russella Dar, MD, [facility]  n. eSIGNED:   Venita Lick. Havanna Groner at 03/11/2012 09:35 AM  Rushie Chestnut, [id number]

## 2012-03-11 NOTE — Progress Notes (Signed)
Patient did not experience any of the following events: a burn prior to discharge; a fall within the facility; wrong site/side/patient/procedure/implant event; or a hospital transfer or hospital admission upon discharge from the facility. (G8907) Patient did not have preoperative order for IV antibiotic SSI prophylaxis. (G8918)  

## 2012-03-12 ENCOUNTER — Telehealth: Payer: Self-pay | Admitting: *Deleted

## 2012-03-12 NOTE — Telephone Encounter (Signed)
  Follow up Call-  Call back number 03/11/2012  Post procedure Call Back phone  # [phone]  Permission to leave phone message Yes     Patient questions:  Do you have a fever, pain , or abdominal swelling? no Pain Score  0 *  Have you tolerated food without any problems? yes  Have you been able to return to your normal activities? yes  Do you have any questions about your discharge instructions: Diet   no Medications  no Follow up visit  no  Do you have questions or concerns about your Care? no  Actions: * If pain score is 4 or above: No action needed, pain <4.

## 2012-03-16 ENCOUNTER — Encounter: Payer: Self-pay | Admitting: Gastroenterology

## 2012-03-17 DIAGNOSIS — M542 Cervicalgia: Secondary | ICD-10-CM | POA: Diagnosis not present

## 2012-03-19 ENCOUNTER — Other Ambulatory Visit: Payer: Self-pay | Admitting: Family Medicine

## 2012-03-23 ENCOUNTER — Ambulatory Visit (INDEPENDENT_AMBULATORY_CARE_PROVIDER_SITE_OTHER): Payer: Medicare Other | Admitting: Family Medicine

## 2012-03-23 ENCOUNTER — Encounter: Payer: Self-pay | Admitting: Family Medicine

## 2012-03-23 ENCOUNTER — Other Ambulatory Visit (HOSPITAL_COMMUNITY)
Admission: RE | Admit: 2012-03-23 | Discharge: 2012-03-23 | Disposition: A | Discharge status: Home / Self Care | Payer: Medicare Other | Source: Ambulatory Visit | Attending: Family Medicine | Admitting: Family Medicine

## 2012-03-23 ENCOUNTER — Other Ambulatory Visit: Payer: Self-pay | Admitting: *Deleted

## 2012-03-23 VITALS — BP 118/78 | HR 63 | Temp 97.6°F | Ht 63.0 in | Wt 154.8 lb

## 2012-03-23 DIAGNOSIS — B370 Candidal stomatitis: Secondary | ICD-10-CM

## 2012-03-23 DIAGNOSIS — M25569 Pain in unspecified knee: Secondary | ICD-10-CM

## 2012-03-23 DIAGNOSIS — M25562 Pain in left knee: Secondary | ICD-10-CM

## 2012-03-23 DIAGNOSIS — N760 Acute vaginitis: Secondary | ICD-10-CM | POA: Insufficient documentation

## 2012-03-23 DIAGNOSIS — R52 Pain, unspecified: Secondary | ICD-10-CM

## 2012-03-23 DIAGNOSIS — M25561 Pain in right knee: Secondary | ICD-10-CM

## 2012-03-23 DIAGNOSIS — D649 Anemia, unspecified: Secondary | ICD-10-CM | POA: Diagnosis not present

## 2012-03-23 DIAGNOSIS — L98499 Non-pressure chronic ulcer of skin of other sites with unspecified severity: Secondary | ICD-10-CM

## 2012-03-23 DIAGNOSIS — K633 Ulcer of intestine: Secondary | ICD-10-CM

## 2012-03-23 DIAGNOSIS — IMO0002 Reserved for concepts with insufficient information to code with codable children: Secondary | ICD-10-CM

## 2012-03-23 HISTORY — DX: Ulcer of intestine: K63.3

## 2012-03-23 LAB — CBC
HCT: 39.3 % (ref 36.0–46.0)
Hemoglobin: 12.9 g/dL (ref 12.0–15.0)
MCHC: 32.8 g/dL (ref 30.0–36.0)
MCV: 92.3 fl (ref 78.0–100.0)
Platelets: 246 10*3/uL (ref 150.0–400.0)
RBC: 4.25 Mil/uL (ref 3.87–5.11)
RDW: 13.5 % (ref 11.5–14.6)
WBC: 7.7 10*3/uL (ref 4.5–10.5)

## 2012-03-23 MED ORDER — FLUCONAZOLE 150 MG PO TABS: 150.0000 mg | ORAL_TABLET | ORAL | Status: AC

## 2012-03-23 MED ORDER — TRAMADOL HCL 50 MG PO TABS: 50.0000 mg | ORAL_TABLET | Freq: Three times a day (TID) | ORAL | Status: AC | PRN

## 2012-03-23 MED ORDER — METRONIDAZOLE 500 MG PO TABS: 500.0000 mg | ORAL_TABLET | Freq: Two times a day (BID) | ORAL | Status: AC

## 2012-03-23 MED ORDER — NYSTATIN 100000 UNIT/ML MT SUSP: 500000.0000 [IU] | Freq: Four times a day (QID) | OROMUCOSAL | Status: AC

## 2012-03-23 MED ORDER — RANITIDINE HCL 300 MG PO TABS: 300.0000 mg | ORAL_TABLET | Freq: Every day | ORAL | Status: DC

## 2012-03-23 MED ORDER — BISOPROLOL FUMARATE 5 MG PO TABS: 5.0000 mg | ORAL_TABLET | Freq: Every day | ORAL | Status: DC

## 2012-03-23 NOTE — Assessment & Plan Note (Signed)
Repeat CBC today due to recent discovery of ulcer

## 2012-03-23 NOTE — Progress Notes (Signed)
Patient ID: Chelsea Hancock, female   DOB: [birth year], 78 y.o.   MRN: [id number] Chelsea Hancock [id number] [birth year] 03/23/2012      Progress Note-Follow Up  Subjective  Chief Complaint  Chief Complaint  Patient presents with  . discuss ulcer in colon    need pain meds for fibromyalgia    HPI  This is a 78 year old Caucasian female who is in today for followup on multiple medical problems. She has been following with GI due to colon polyps recent colonoscopy. On her recent colonoscopy they did also find a sigmoid ulcer. She was instructed to stop and says and unfortunately is having increased pain as a result. She was instructed to return here for pain management. She continues to work and has trouble dealing with her myalgias from her fibromyalgia as well as her bilateral knee pain secondary to her significant osteoarthritis. She is awaiting knee replacement in the fall but is trying to keep working until then. She also complaining of swelling irritation or hot or last one to 2 weeks. Also notes intermittent vaginal discharge off and on for the last month. She describes it is somewhat yellow and itchy. There is an odor. No fevers, chills, chest pain, palpitations, shortness of breath, fevers, chills, bloody or tarry stool  Past Medical History  Diagnosis Date  . Hyperlipidemia   . GERD (gastroesophageal reflux disease)   . Palpitation   . Carotid bruit     Right  . Edema   . Arthritis   . Dysrhythmia     paroxysmal A fib, irregular heartbeat  . PONV (postoperative nausea and vomiting)   . Hyperlipidemia 01/27/2012  . Allergy     portabella mushrooms/diarrhea  . Heart murmur   . Osteoporosis   . Fibromyalgia   . Thrush 03/23/2012  . Vaginitis 03/23/2012  . Sigmoid colon ulcer 03/23/2012    Past Surgical History  Procedure Date  . Abdominal hysterectomy 1981  . Laminectomy 1960  . Veins stripped 1970  . Total hip arthroplasty 2012    Left  . Carpal tunnel release      left, ulnar nerve release at elbow  . Cervical fusion 2003, 2005, 2013  . Back surgery 1997    x 2  . Tonsillectomy 1943    Family History  Problem Relation Age of Onset  . Arthritis      family hx  . Cancer      family hx  . Stroke    . Mental illness    . Hyperlipidemia    . Hypertension    . Cancer Mother   . Stomach cancer Mother   . Stroke Father   . Colon cancer Father   . HIV Son     Aids  . Anesthesia problems Neg Hx   . Stomach cancer Maternal Grandfather   . Stomach cancer Paternal Grandmother     History   Social History  . Marital Status: Divorced    Spouse Name: N/A    Number of Children: N/A  . Years of Education: N/A   Occupational History  . Not on file.   Social History Main Topics  . Smoking status: Former Smoker -- 4 years    Types: Cigarettes    Quit date: 09/22/1990  . Smokeless tobacco: Never Used  . Alcohol Use: 8.4 oz/week    14 Glasses of wine per week     WINE WITH DINNER. 2 GLASSES RED WINE  . Drug Use: No  . Sexually  Active: Not on file   Other Topics Concern  . Not on file   Social History Narrative  . No narrative on file    Current Outpatient Prescriptions on File Prior to Visit  Medication Sig Dispense Refill  . aspirin EC 81 MG tablet Take 81 mg by mouth daily.      . Biotin 5 MG CAPS Take 1 capsule by mouth daily.        . bisoprolol (ZEBETA) 5 MG tablet Take 5 mg by mouth at bedtime.       . Calcium Carbonate-Vitamin D (CALCIUM 600+D) 600-200 MG-UNIT TABS Take by mouth daily after breakfast. Dr. Phylliss Bob        . citalopram (CELEXA) 10 MG tablet Take 1 tablet (10 mg total) by mouth daily.  30 tablet  3  . Clobetasol Prop Oint-Coal Tar (CLOBETAPLUS OINTMENT EX) Apply topically 2 (two) times daily as needed.       Marland Kitchen esomeprazole (NEXIUM) 40 MG capsule Take 40 mg by mouth daily at 2 PM daily at 2 PM.      . Eszopiclone (ESZOPICLONE) 3 MG TABS Take 1 tablet (3 mg total) by mouth at bedtime. Take immediately before  bedtime  30 tablet  5  . ezetimibe (ZETIA) 10 MG tablet Take 10 mg by mouth at bedtime.      . gabapentin (NEURONTIN) 600 MG tablet Take 600 mg by mouth 3 (three) times daily.        . hydrOXYzine (ATARAX/VISTARIL) 10 MG tablet TAKE ONE TABLET EVERY SIXHOURS AS NEEDED FOR      ITCHING  50 tablet  0  . Magnesium 250 MG TABS Take by mouth every morning. Dr. Phylliss Bob       . Multiple Vitamin (MULITIVITAMIN WITH MINERALS) TABS Take 1 tablet by mouth daily.      . nortriptyline (PAMELOR) 25 MG capsule Take 2 capsules (50 mg total) by mouth at bedtime.  30 capsule  11  . PREMARIN 0.45 MG tablet Take 1 tablet (0.45 mg total) by mouth as directed. Take daily for 21 days then do not take for 7 days.  21 tablet  2  . Probiotic Product (ALIGN) 4 MG CAPS Take 4 mg by mouth daily.        . simvastatin (ZOCOR) 40 MG tablet Take 1 tablet (40 mg total) by mouth every evening.  30 tablet  11  . torsemide (DEMADEX) 10 MG tablet Take 10 mg by mouth every morning.       Marland Kitchen ibuprofen (ADVIL,MOTRIN) 400 MG tablet Take 400 mg by mouth 3 (three) times daily.       . ranitidine (ZANTAC) 300 MG tablet Take 1 tablet (300 mg total) by mouth at bedtime.  30 tablet  3    Allergies  Allergen Reactions  . Alendronate Sodium Other (See Comments)    Severe chest pain similar to heart attack   . Cefuroxime Axetil Other (See Comments)    unknown  . Codeine Nausea And Vomiting  . Macrodantin   . Meperidine Hcl Nausea And Vomiting  . Other Other (See Comments)    Hismanal and Maprobamate  portobello mushrooms - diarrhea  . Sulfonamide Derivatives Hives, Itching and Swelling    Review of Systems  Review of Systems  Constitutional: Negative for fever and malaise/fatigue.  HENT: Negative for congestion.   Eyes: Negative for discharge.  Respiratory: Negative for shortness of breath.   Cardiovascular: Negative for chest pain, palpitations and leg swelling.  Gastrointestinal:  Negative for nausea, abdominal pain, diarrhea,  constipation, blood in stool and melena.       C/o abdominal bloating  Genitourinary: Negative for dysuria.  Musculoskeletal: Positive for myalgias, back pain and joint pain. Negative for falls.       Has been using Ibuprofen to manage pain but has had to stop due to ulcer. B/L knees are the worst  Skin: Negative for rash.  Neurological: Negative for loss of consciousness and headaches.  Endo/Heme/Allergies: Negative for polydipsia.  Psychiatric/Behavioral: Negative for depression and suicidal ideas. The patient is not nervous/anxious and does not have insomnia.     Objective  BP 165/76  Pulse 63  Temp 97.6 F (36.4 C) (Temporal)  Ht 5\' 3"  (1.6 m)  Wt 154 lb 12.8 oz (70.217 kg)  BMI 27.42 kg/m2  SpO2 94%  Physical Exam  Physical Exam  Constitutional: She is oriented to person, place, and time and well-developed, well-nourished, and in no distress. No distress.  HENT:  Head: Normocephalic and atraumatic.  Eyes: Conjunctivae are normal.  Neck: Neck supple. No thyromegaly present.  Cardiovascular: Normal rate, regular rhythm and normal heart sounds.   No murmur heard. Pulmonary/Chest: Effort normal and breath sounds normal. She has no wheezes.  Abdominal: She exhibits no distension and no mass.  Genitourinary: Vaginal discharge found.       Thin gray discharge at introitus and thick white discharge internally  Musculoskeletal: She exhibits no edema.  Lymphadenopathy:    She has no cervical adenopathy.  Neurological: She is alert and oriented to person, place, and time.  Skin: Skin is warm and dry. No rash noted. She is not diaphoretic.  Psychiatric: Memory, affect and judgment normal.    Lab Results  Component Value Date   TSH 3.09 12/05/2010   Lab Results  Component Value Date   WBC 9.3 12/05/2011   HGB 13.7 12/05/2011   HCT 39.7 12/05/2011   MCV 90.4 12/05/2011   PLT 253 12/05/2011   Lab Results  Component Value Date   CREATININE 0.79 12/05/2011   BUN 22 12/05/2011     NA 136 12/05/2011   K 3.7 12/05/2011   CL 97 12/05/2011   CO2 27 12/05/2011   Lab Results  Component Value Date   ALT 18 12/05/2011   AST 22 12/05/2011   ALKPHOS 88 12/05/2011   BILITOT 0.2* 12/05/2011   Lab Results  Component Value Date   CHOL 184 09/20/2009   Lab Results  Component Value Date   HDL 50.90 09/20/2009   Lab Results  Component Value Date   LDLCALC 97 09/20/2009   Lab Results  Component Value Date   TRIG 183.0* 09/20/2009   Lab Results  Component Value Date   CHOLHDL 4 09/20/2009     Assessment & Plan  Thrush Nystatin Swish and spit 5 cc qid  ANEMIA Repeat CBC today due to recent discovery of ulcer  Vaginitis Exam suggestive of BV and yeast. Checked cultures and started on Flagyl and Diflucan today, continue probiotics  Sigmoid colon ulcer Stop NSAID use at least for now and asked to take 2 Tylenol bid and given Tramadol to use prn for mod to severe pain, has taken in the past without any significant difficulty  Bilateral knee pain Plans R TKR and then L TKR starting in November. Is given Tramadol to help manage pain, now that she has to stop her NSAIDs presently

## 2012-03-23 NOTE — Assessment & Plan Note (Signed)
Exam suggestive of BV and yeast. Checked cultures and started on Flagyl and Diflucan today, continue probiotics

## 2012-03-23 NOTE — Assessment & Plan Note (Signed)
Nystatin Swish and spit 5 cc qid

## 2012-03-23 NOTE — Telephone Encounter (Signed)
Fax Received. Refill Completed. Chelsea Hancock (R.M.A)   

## 2012-03-23 NOTE — Patient Instructions (Addendum)
Ulcer Disease You have an ulcer. This may be in your stomach (gastric ulcer) or in the first part of your small bowel, the duodenum (duodenal ulcer). An ulcer is a break in the lining of the stomach or duodenum. The ulcer causes erosion into the deeper tissue. CAUSES  The stomach has a lining to protect itself from the acid that digests food. The lining can be damaged in two main ways:  The Helico Pylori bacteria (H. Pyolori) can infect the lining of the stomach and cause ulcers.   Nonsteroidal, anti-inflammatory medications (NSAIDS) can cause gastric ulcerations.   Smoking tobacco can increase the acid in the stomach. This can lead to ulcers, and will impair healing of ulcers.  Other factors, such as alcohol use and stress may contribute to ulcer formation. Rarely, a tumor or cancer can cause an ulcer.  SYMPTOMS  The problems (symptoms) of ulcer disease are usually a burning or gnawing of the mid-upper belly (abdomen). This is often worse on an empty stomach and may get better with food. This may be associated with feeling sick to your stomach (nausea), bloating, and vomiting. If the ulcer results in bleeding, it can cause:  Black, tarry stools.   Vomiting of bright red blood.   Vomiting coffee-ground-looking materials.  With severe bleeding, there may be loss of consciousness and shock.  DIAGNOSIS  Learning what is wrong (diagnosis) is usually made based upon your history and an exam. Medications are so effective that further tests may not be necessary. If needed, other tests may include:  Blood tests, x-rays, or heart tests. These are done to be sure that no other conditions are causing your symptoms.   X-rays (Barium studies) such as an upper GI series.   Most commonly, an upper GI endoscopy can confirm your diagnosis. This is when a flexible tube is passed through the mouth (using sedative medication). The tube is used to look at the inside of esophagus, stomach, and small bowel.  Abnormal pieces of tissue may be removed to examine under the microscope (biopsy).  TREATMENT  Bleeding from ulcers can usually be treated via endoscopy. Rarely, surgery is needed for ulcers when bleeding cannot be stopped. Surgery is needed if the ulcer goes through the wall of the stomach or duodenum.  After any bleeding is stopped, medications are the main treatment:  If your ulcer was caused by the bacteria H. Pylori, then you will need antibiotics to kill the infection. Usually, a program involving more than one antibiotic, and a medicine for stomach acid, is prescribed.   Medicine to decrease acid production is used for almost all ulcers. Your caregiver will make a recommendation. They will also tell you how long you must use the medication.   Stop using any medications or substances that may have contributed to your ulcer (alcohol, tobacco, caffeine, for example).   Medications are available that protect the lining of the bowel.  HOME CARE INSTRUCTIONS   Continue regular work and usual activities unless advised otherwise by your caregiver.   Avoid tobacco, alcohol, and caffeine. Tobacco use will decrease and slow the rates of healing.   Avoid foods that seem to aggravate or cause discomfort.   There are many over-the-counter products available to control stomach acid and other symptoms. Discuss these with your caregiver before using them. DO NOT substitute over-the-counter medications for prescription medications without discussing with your caregiver.   Special diets are not usually needed.   Keep any follow-up appointments and blood tests, as   directed.  SEEK MEDICAL CARE IF:   Your pain or other ulcer symptoms do not improve within a few days of starting treatment.   You develop diarrhea. This can be a complication of certain treatments.   You have ongoing indigestion or heartburn, even if your main ulcer symptoms are improved.  SEEK IMMEDIATE MEDICAL CARE IF:   You develop  bright red, rectal bleeding; dark black, tarry stools; or vomit blood.   You become light-headed, weak, have fainting episodes, or become sweaty, cold and clammy.   You experience severe abdominal pain not controlled by medications. DO NOT take pain medications unless ordered by your caregiver.  Document Released: 06/18/2005 Document Revised: 08/28/2011 Document Reviewed: 05/06/2007 [facility] Patient Information 2012 [place], [place].

## 2012-03-23 NOTE — Assessment & Plan Note (Signed)
Plans R TKR and then L TKR starting in November. Is given Tramadol to help manage pain, now that she has to stop her NSAIDs presently

## 2012-03-23 NOTE — Assessment & Plan Note (Signed)
Stop NSAID use at least for now and asked to take 2 Tylenol bid and given Tramadol to use prn for mod to severe pain, has taken in the past without any significant difficulty

## 2012-03-26 ENCOUNTER — Other Ambulatory Visit: Payer: Self-pay | Admitting: Family Medicine

## 2012-03-26 MED ORDER — ESOMEPRAZOLE MAGNESIUM 40 MG PO CPDR: 40.0000 mg | DELAYED_RELEASE_CAPSULE | Freq: Every day | ORAL | Status: DC

## 2012-03-26 MED ORDER — HYDROXYZINE HCL 10 MG PO TABS: 10.0000 mg | ORAL_TABLET | Freq: Four times a day (QID) | ORAL | Status: DC | PRN

## 2012-03-26 NOTE — Telephone Encounter (Signed)
Nexium and hydroxizine last filled by Dr. Scotty Court office.   Nexium #30 x 11 sent to pharmacy. hydroxizine #50 x 0 sent to pharmacy.  Pt has follow up appt with Dr. Abner Greenspan on 04/27/12

## 2012-03-30 ENCOUNTER — Other Ambulatory Visit: Payer: Self-pay | Admitting: Family Medicine

## 2012-03-31 NOTE — Telephone Encounter (Signed)
Please advise if refill OK.

## 2012-04-02 MED ORDER — SIMVASTATIN 40 MG PO TABS: 40.0000 mg | ORAL_TABLET | Freq: Every evening | ORAL | Status: DC

## 2012-04-02 NOTE — Telephone Encounter (Addendum)
eScribe request for refill on hydroxizine--approved by Dr. Abner Greenspan on 03/31/12.  Faxed refill request received from pharmacy for simvastatin Last filled by MD ( Dr. Scotty Court) on 03/21/11, #30 x 11 Last seen on 01/27/12 Follow up 8/60/13 RX sent.

## 2012-04-02 NOTE — Addendum Note (Signed)
Addended by: Luisa Dago on: 04/02/2012 02:39 PM   Modules accepted: Orders

## 2012-04-07 ENCOUNTER — Other Ambulatory Visit: Payer: Self-pay | Admitting: Family Medicine

## 2012-04-20 ENCOUNTER — Other Ambulatory Visit (INDEPENDENT_AMBULATORY_CARE_PROVIDER_SITE_OTHER): Payer: Medicare Other

## 2012-04-20 DIAGNOSIS — E785 Hyperlipidemia, unspecified: Secondary | ICD-10-CM

## 2012-04-20 DIAGNOSIS — R002 Palpitations: Secondary | ICD-10-CM

## 2012-04-20 DIAGNOSIS — D649 Anemia, unspecified: Secondary | ICD-10-CM | POA: Diagnosis not present

## 2012-04-20 DIAGNOSIS — E039 Hypothyroidism, unspecified: Secondary | ICD-10-CM

## 2012-04-20 DIAGNOSIS — Z0000 Encounter for general adult medical examination without abnormal findings: Secondary | ICD-10-CM

## 2012-04-20 LAB — HEPATIC FUNCTION PANEL
ALT: 22 U/L (ref 0–35)
AST: 32 U/L (ref 0–37)
Albumin: 3.9 g/dL (ref 3.5–5.2)
Alkaline Phosphatase: 94 U/L (ref 39–117)
Bilirubin, Direct: 0 mg/dL (ref 0.0–0.3)
Total Bilirubin: 0.5 mg/dL (ref 0.3–1.2)
Total Protein: 6.9 g/dL (ref 6.0–8.3)

## 2012-04-20 LAB — RENAL FUNCTION PANEL
Albumin: 3.9 g/dL (ref 3.5–5.2)
BUN: 18 mg/dL (ref 6–23)
CO2: 28 mEq/L (ref 19–32)
Calcium: 9.3 mg/dL (ref 8.4–10.5)
Chloride: 102 mEq/L (ref 96–112)
Creatinine, Ser: 0.7 mg/dL (ref 0.4–1.2)
GFR: 81.18 mL/min (ref >60.00)
Glucose, Bld: 105 mg/dL — ABNORMAL HIGH (ref 70–99)
Phosphorus: 3 mg/dL (ref 2.3–4.6)
Potassium: 4.2 mEq/L (ref 3.5–5.1)
Sodium: 140 mEq/L (ref 135–145)

## 2012-04-20 LAB — CBC
HCT: 38.8 % (ref 36.0–46.0)
Hemoglobin: 12.7 g/dL (ref 12.0–15.0)
MCHC: 32.7 g/dL (ref 30.0–36.0)
MCV: 93.3 fl (ref 78.0–100.0)
Platelets: 237 10*3/uL (ref 150.0–400.0)
RBC: 4.16 Mil/uL (ref 3.87–5.11)
RDW: 13.9 % (ref 11.5–14.6)
WBC: 6.4 10*3/uL (ref 4.5–10.5)

## 2012-04-20 LAB — LIPID PANEL
Cholesterol: 187 mg/dL (ref 0–200)
HDL: 60.2 mg/dL (ref >39.00)
LDL Cholesterol: 89 mg/dL (ref 0–99)
Total CHOL/HDL Ratio: 3
Triglycerides: 191 mg/dL — ABNORMAL HIGH (ref 0.0–149.0)
VLDL: 38.2 mg/dL (ref 0.0–40.0)

## 2012-04-20 LAB — TSH: TSH: 2.63 u[IU]/mL (ref 0.35–5.50)

## 2012-04-21 LAB — HEMOGLOBIN A1C: Hgb A1c MFr Bld: 6 % (ref 4.6–6.5)

## 2012-04-22 DIAGNOSIS — IMO0002 Reserved for concepts with insufficient information to code with codable children: Secondary | ICD-10-CM | POA: Diagnosis not present

## 2012-04-22 DIAGNOSIS — M1710 Unilateral primary osteoarthritis, unspecified knee: Secondary | ICD-10-CM | POA: Diagnosis not present

## 2012-04-23 ENCOUNTER — Telehealth: Payer: Self-pay

## 2012-04-23 NOTE — Telephone Encounter (Signed)
I left a message for patient to return my call. We received paperwork from [facility] for surgery clearance. Patient needs to schedule an appt before 08-16-12

## 2012-04-27 ENCOUNTER — Encounter: Payer: Self-pay | Admitting: Family Medicine

## 2012-04-27 ENCOUNTER — Ambulatory Visit (INDEPENDENT_AMBULATORY_CARE_PROVIDER_SITE_OTHER): Payer: Medicare Other | Admitting: Family Medicine

## 2012-04-27 VITALS — BP 118/76 | HR 62 | Temp 97.6°F | Ht 63.0 in | Wt 156.8 lb

## 2012-04-27 DIAGNOSIS — K219 Gastro-esophageal reflux disease without esophagitis: Secondary | ICD-10-CM

## 2012-04-27 DIAGNOSIS — R51 Headache: Secondary | ICD-10-CM

## 2012-04-27 DIAGNOSIS — G43909 Migraine, unspecified, not intractable, without status migrainosus: Secondary | ICD-10-CM | POA: Insufficient documentation

## 2012-04-27 DIAGNOSIS — R0989 Other specified symptoms and signs involving the circulatory and respiratory systems: Secondary | ICD-10-CM

## 2012-04-27 DIAGNOSIS — R5383 Other fatigue: Secondary | ICD-10-CM | POA: Diagnosis not present

## 2012-04-27 DIAGNOSIS — R739 Hyperglycemia, unspecified: Secondary | ICD-10-CM

## 2012-04-27 DIAGNOSIS — D649 Anemia, unspecified: Secondary | ICD-10-CM

## 2012-04-27 DIAGNOSIS — T148XXA Other injury of unspecified body region, initial encounter: Secondary | ICD-10-CM

## 2012-04-27 DIAGNOSIS — R52 Pain, unspecified: Secondary | ICD-10-CM

## 2012-04-27 DIAGNOSIS — R5381 Other malaise: Secondary | ICD-10-CM

## 2012-04-27 DIAGNOSIS — R002 Palpitations: Secondary | ICD-10-CM

## 2012-04-27 DIAGNOSIS — W57XXXA Bitten or stung by nonvenomous insect and other nonvenomous arthropods, initial encounter: Secondary | ICD-10-CM

## 2012-04-27 DIAGNOSIS — M25569 Pain in unspecified knee: Secondary | ICD-10-CM

## 2012-04-27 DIAGNOSIS — M25561 Pain in right knee: Secondary | ICD-10-CM

## 2012-04-27 DIAGNOSIS — E039 Hypothyroidism, unspecified: Secondary | ICD-10-CM

## 2012-04-27 DIAGNOSIS — R7309 Other abnormal glucose: Secondary | ICD-10-CM

## 2012-04-27 DIAGNOSIS — R519 Headache, unspecified: Secondary | ICD-10-CM

## 2012-04-27 DIAGNOSIS — T148 Other injury of unspecified body region: Secondary | ICD-10-CM

## 2012-04-27 DIAGNOSIS — E785 Hyperlipidemia, unspecified: Secondary | ICD-10-CM

## 2012-04-27 MED ORDER — TRAMADOL HCL 50 MG PO TABS: 50.0000 mg | ORAL_TABLET | Freq: Four times a day (QID) | ORAL | Status: DC | PRN

## 2012-04-27 MED ORDER — GABAPENTIN 300 MG PO CAPS: 300.0000 mg | ORAL_CAPSULE | Freq: Every day | ORAL | Status: DC

## 2012-04-27 MED ORDER — PSYLLIUM 58.6 % PO POWD: 1.0000 | Freq: Three times a day (TID) | ORAL | Status: DC

## 2012-04-27 NOTE — Assessment & Plan Note (Signed)
Patient with a history of headaches, take bites, fatigue and chronic pain is requesting a Lyme titer be drawn and that is ordered today.

## 2012-04-27 NOTE — Assessment & Plan Note (Addendum)
Scheduled for surgical replacement of right knee on 08/16/2012 with Dr Despina Hick. Will return in early November for revaluation and repeat of lab work to clear her at that time.

## 2012-04-27 NOTE — Assessment & Plan Note (Signed)
Mild elevation of triglycerides. Encouraged avoidance of transplants and simple carbs. Continue Krill oil and add Metamucil. Reassess in 3 months time

## 2012-04-27 NOTE — Addendum Note (Signed)
Addended by: Baldemar Lenis R on: 04/27/2012 11:27 AM   Modules accepted: Orders

## 2012-04-27 NOTE — Patient Instructions (Addendum)

## 2012-04-27 NOTE — Assessment & Plan Note (Signed)
Well controlled as long she takes her Nexium daily. If she misses a dose of flares. Maalox and TUMS are helpful when she needs them

## 2012-04-27 NOTE — Telephone Encounter (Signed)
Pt is coming in today for an appt. We will discuss surgery clearance

## 2012-04-27 NOTE — Assessment & Plan Note (Signed)
Resolved with recent labwork 

## 2012-04-27 NOTE — Assessment & Plan Note (Signed)
Has appt with cardiology later this week, review of the chart shows that they are going to keep a close eye on her carotid arteries secondary to some changes on  Last doppler

## 2012-04-27 NOTE — Assessment & Plan Note (Signed)
Fasting labs recently revealed a glucose of 105. Hemoglobin A1c was obtained and it was 6.0. She is advised to decrease her carbohydrate intake. She is asked to use complex carbs and small servings 1 per meal. We will continue to monitor blood work again in 3 months

## 2012-04-27 NOTE — Assessment & Plan Note (Signed)
TSH within normal limits on recent blood draw

## 2012-04-27 NOTE — Assessment & Plan Note (Signed)
No concerns today, is seeing cardiology later this week

## 2012-04-27 NOTE — Progress Notes (Signed)
Patient ID: Chelsea Hancock, female   DOB: 12-30-1933, 78 y.o.   MRN: [id number] ANWAR CRILL [id number] 11-18-1933 04/27/2012      Progress Note-Follow Up  Subjective  Chief Complaint  Chief Complaint  Patient presents with  . Follow-up    3 month     HPI  Patient is a 78 year old Caucasian female who is in today for three-month followup. Overall she is doing relatively well. She continues to struggle with chronic pain. The pain is both diffuse with arthralgias and myalgias but notably worse in her hips and knees. She is scheduled for total knee replacement in November of this year. The right knee does cause her significant difficulty so she is anxious to have this corrected. She is requesting a Lyme titer today secondary to a chronic headaches history tick bites fatigue, malaise, myalgias. Uses gabapentin for chronic pruritus and finds that helpful. Does have trouble getting comfortable at night. For pain she uses tramadol 3 times a day and finds this marginally helpful. Uses ibuprofen 200 mg tabs 2 twice a day just on Wednesdays for her job but otherwise this to a minimum. Her heartburn is well controlled as long as she uses Nexium daily. She does use Maalox and TUMS for breakthrough occasionally with good relief. Denies chest pain, palpitations, shortness of breath, fevers or recent illness.  Past Medical History  Diagnosis Date  . Hyperlipidemia   . GERD (gastroesophageal reflux disease)   . Palpitation   . Carotid bruit     Right  . Edema   . Arthritis   . Dysrhythmia     paroxysmal A fib, irregular heartbeat  . PONV (postoperative nausea and vomiting)   . Hyperlipidemia 01/27/2012  . Allergy     portabella mushrooms/diarrhea  . Heart murmur   . Osteoporosis   . Fibromyalgia   . Thrush 03/23/2012  . Vaginitis 03/23/2012  . Sigmoid colon ulcer 03/23/2012  . Migraine 04/27/2012  . Hyperglycemia 04/27/2012    Past Surgical History  Procedure Date  . Abdominal  hysterectomy 1981  . Laminectomy 1960  . Veins stripped 1970  . Total hip arthroplasty 2012    Left  . Carpal tunnel release     left, ulnar nerve release at elbow  . Cervical fusion 2003, 2005, 2013  . Back surgery 1997    x 2  . Tonsillectomy 1943    Family History  Problem Relation Age of Onset  . Arthritis      family hx  . Cancer      family hx  . Stroke    . Mental illness    . Hyperlipidemia    . Hypertension    . Cancer Mother   . Stomach cancer Mother   . Stroke Father   . Colon cancer Father   . HIV Son     Aids  . Anesthesia problems Neg Hx   . Stomach cancer Maternal Grandfather   . Stomach cancer Paternal Grandmother     History   Social History  . Marital Status: Divorced    Spouse Name: N/A    Number of Children: N/A  . Years of Education: N/A   Occupational History  . Not on file.   Social History Main Topics  . Smoking status: Former Smoker -- 4 years    Types: Cigarettes    Quit date: 09/22/1990  . Smokeless tobacco: Never Used  . Alcohol Use: 8.4 oz/week    14 Glasses of wine per week  WINE WITH DINNER. 2 GLASSES RED WINE  . Drug Use: No  . Sexually Active: Not on file   Other Topics Concern  . Not on file   Social History Narrative  . No narrative on file    Current Outpatient Prescriptions on File Prior to Visit  Medication Sig Dispense Refill  . aspirin EC 81 MG tablet Take 81 mg by mouth daily.      . Biotin 5 MG CAPS Take 1 capsule by mouth daily.        . bisoprolol (ZEBETA) 5 MG tablet Take 1 tablet (5 mg total) by mouth at bedtime.  30 tablet  5  . Calcium Carbonate-Vitamin D (CALCIUM 600+D) 600-200 MG-UNIT TABS Take by mouth daily after breakfast. Dr. Phylliss Bob        . citalopram (CELEXA) 10 MG tablet take 1 tablet by mouth once daily  100 tablet  0  . Clobetasol Prop Oint-Coal Tar (CLOBETAPLUS OINTMENT EX) Apply topically 2 (two) times daily as needed.       Marland Kitchen esomeprazole (NEXIUM) 40 MG capsule Take 1 capsule (40 mg  total) by mouth daily at 2 PM daily at 2 PM.  30 capsule  11  . Eszopiclone (ESZOPICLONE) 3 MG TABS Take 1 tablet (3 mg total) by mouth at bedtime. Take immediately before bedtime  30 tablet  5  . gabapentin (NEURONTIN) 600 MG tablet Take 600 mg by mouth 3 (three) times daily.        . hydrOXYzine (ATARAX/VISTARIL) 10 MG tablet take 1 tablet by mouth every 6 hours if needed for itching  50 tablet  0  . Magnesium 250 MG TABS Take by mouth every morning. Dr. Phylliss Bob       . Multiple Vitamin (MULITIVITAMIN WITH MINERALS) TABS Take 1 tablet by mouth daily.      . nortriptyline (PAMELOR) 25 MG capsule Take 2 capsules (50 mg total) by mouth at bedtime.  30 capsule  11  . PREMARIN 0.45 MG tablet Take 1 tablet (0.45 mg total) by mouth as directed. Take daily for 21 days then do not take for 7 days.  21 tablet  2  . Probiotic Product (ALIGN) 4 MG CAPS Take 4 mg by mouth daily.        . ranitidine (ZANTAC) 300 MG tablet Take 1 tablet (300 mg total) by mouth at bedtime.  30 tablet  3  . simvastatin (ZOCOR) 40 MG tablet Take 1 tablet (40 mg total) by mouth every evening.  30 tablet  11  . torsemide (DEMADEX) 10 MG tablet Take 10 mg by mouth every morning.       Marland Kitchen ZETIA 10 MG tablet TAKE 1 TABLET EACH DAY  30 tablet  1    Allergies  Allergen Reactions  . Alendronate Sodium Other (See Comments)    Severe chest pain similar to heart attack   . Cefuroxime Axetil Other (See Comments)    unknown  . Codeine Nausea And Vomiting  . Macrodantin   . Meperidine Hcl Nausea And Vomiting  . Other Other (See Comments)    Hismanal and Maprobamate  portobello mushrooms - diarrhea  . Sulfonamide Derivatives Hives, Itching and Swelling    Review of Systems  Review of Systems  Constitutional: Positive for malaise/fatigue. Negative for fever.  HENT: Negative for congestion.   Eyes: Negative for discharge.  Respiratory: Negative for shortness of breath.   Cardiovascular: Negative for chest pain, palpitations and leg  swelling.  Gastrointestinal: Negative for nausea,  abdominal pain and diarrhea.  Genitourinary: Negative for dysuria.  Musculoskeletal: Positive for back pain and joint pain. Negative for falls.       Hip and knee pain worse after prolonged immobility with stiffness  Skin: Negative for rash.  Neurological: Positive for headaches. Negative for loss of consciousness.  Endo/Heme/Allergies: Negative for polydipsia.  Psychiatric/Behavioral: Negative for depression and suicidal ideas. The patient is not nervous/anxious and does not have insomnia.     Objective  BP 149/71  Pulse 62  Temp 97.6 F (36.4 C) (Temporal)  Ht 5\' 3"  (1.6 m)  Wt 156 lb 12.8 oz (71.124 kg)  BMI 27.78 kg/m2  SpO2 91%  Physical Exam  Physical Exam  Constitutional: She is oriented to person, place, and time and well-developed, well-nourished, and in no distress. No distress.  HENT:  Head: Normocephalic and atraumatic.  Eyes: Conjunctivae are normal.  Neck: Neck supple. No thyromegaly present.  Cardiovascular: Normal rate, regular rhythm and normal heart sounds.   No murmur heard. Pulmonary/Chest: Effort normal and breath sounds normal. She has no wheezes.  Abdominal: She exhibits no distension and no mass.  Musculoskeletal: She exhibits edema.       1+ b/l lower extremities  Lymphadenopathy:    She has no cervical adenopathy.  Neurological: She is alert and oriented to person, place, and time.  Skin: Skin is warm and dry. No rash noted. She is not diaphoretic.  Psychiatric: Memory, affect and judgment normal.    Lab Results  Component Value Date   TSH 2.63 04/20/2012   Lab Results  Component Value Date   WBC 6.4 04/20/2012   HGB 12.7 04/20/2012   HCT 38.8 04/20/2012   MCV 93.3 04/20/2012   PLT 237.0 04/20/2012   Lab Results  Component Value Date   CREATININE 0.7 04/20/2012   BUN 18 04/20/2012   NA 140 04/20/2012   K 4.2 04/20/2012   CL 102 04/20/2012   CO2 28 04/20/2012   Lab Results  Component Value  Date   ALT 22 04/20/2012   AST 32 04/20/2012   ALKPHOS 94 04/20/2012   BILITOT 0.5 04/20/2012   Lab Results  Component Value Date   CHOL 187 04/20/2012   Lab Results  Component Value Date   HDL 60.20 04/20/2012   Lab Results  Component Value Date   LDLCALC 89 04/20/2012   Lab Results  Component Value Date   TRIG 191.0* 04/20/2012   Lab Results  Component Value Date   CHOLHDL 3 04/20/2012     Assessment & Plan  Bilateral knee pain Scheduled for surgical replacement of right knee on 08/16/2012 with Dr Despina Hick. Will return in early November for revaluation and repeat of lab work to clear her at that time.  Palpitations No concerns today, is seeing cardiology later this week  CAROTID BRUIT Has appt with cardiology later this week, review of the chart shows that they are going to keep a close eye on her carotid arteries secondary to some changes on  Last doppler  HYPERLIPIDEMIA Mild elevation of triglycerides. Encouraged avoidance of transplants and simple carbs. Continue Krill oil and add Metamucil. Reassess in 3 months time  HYPOTHYROIDISM TSH within normal limits on recent blood draw  ANEMIA Resolved with recent lab work.  Hyperglycemia Fasting labs recently revealed a glucose of 105. Hemoglobin A1c was obtained and it was 6.0. She is advised to decrease her carbohydrate intake. She is asked to use complex carbs and small servings 1 per meal. We will  continue to monitor blood work again in 3 months  Migraine Patient with a history of headaches, take bites, fatigue and chronic pain is requesting a Lyme titer be drawn and that is ordered today.  GERD Well controlled as long she takes her Nexium daily. If she misses a dose of flares. Maalox and TUMS are helpful when she needs them

## 2012-04-28 LAB — B. BURGDORFI ANTIBODIES: B burgdorferi Ab IgG+IgM: 0.25 {ISR}

## 2012-04-28 NOTE — Progress Notes (Signed)
Quick Note:  Patient Informed and voiced understanding ______ 

## 2012-04-29 ENCOUNTER — Encounter: Payer: Self-pay | Admitting: Cardiology

## 2012-04-29 ENCOUNTER — Ambulatory Visit (INDEPENDENT_AMBULATORY_CARE_PROVIDER_SITE_OTHER): Payer: Medicare Other | Admitting: Cardiology

## 2012-04-29 VITALS — BP 130/84 | HR 59 | Ht 65.0 in | Wt 155.0 lb

## 2012-04-29 DIAGNOSIS — R002 Palpitations: Secondary | ICD-10-CM

## 2012-04-29 DIAGNOSIS — E039 Hypothyroidism, unspecified: Secondary | ICD-10-CM | POA: Diagnosis not present

## 2012-04-29 NOTE — Assessment & Plan Note (Signed)
The patient reports that she has very infrequent palpitations.  She is not having any chest pain or shortness of breath.  She has had no TIA symptoms.  She has not progressed postoperatively from her next surgery to the point that she is able to do yard work again which she enjoys

## 2012-04-29 NOTE — Patient Instructions (Addendum)
Your physician recommends that you continue on your current medications as directed. Please refer to the Current Medication list given to you today.  Your physician wants you to follow-up in: 6 months. You will receive a reminder letter in the mail two months in advance. If you don't receive a letter, please call our office to schedule the follow-up appointment.  

## 2012-04-29 NOTE — Assessment & Plan Note (Signed)
The patient is clinically euthyroid. 

## 2012-04-29 NOTE — Progress Notes (Signed)
Chelsea Hancock Date of Birth:  May 19, 1934 [facility] [address] [place], [place]  [address] [phone]         Fax   [phone]  History of Present Illness: This pleasant 78 year old woman is seen for a six-month followup office visit.  She has a past history of dyslipidemia and palpitations.  She has had a lot of neurosurgical problems.  Since we last saw her she had cervical spine surgery by Dr. Danielle Dess in an effort to improve the neurologic function of her left hand.  Unfortunately she states that the left arm and hand are still numb.  The patient also has significant osteoarthritis of her knees.  He is scheduled to have a right total knee replacement in November.  The patient has not been experiencing any new cardiac symptoms.  She has only minimal palpitations.  She's been having no chest pain or shortness of breath.  Current Outpatient Prescriptions  Medication Sig Dispense Refill  . aspirin EC 81 MG tablet Take 81 mg by mouth daily.      . Biotin 5 MG CAPS Take 1 capsule by mouth daily.        . bisoprolol (ZEBETA) 5 MG tablet Take 1 tablet (5 mg total) by mouth at bedtime.  30 tablet  5  . Calcium Carbonate-Vitamin D (CALCIUM 600+D) 600-200 MG-UNIT TABS Take by mouth daily after breakfast. Dr. Phylliss Bob        . citalopram (CELEXA) 10 MG tablet take 1 tablet by mouth once daily  100 tablet  0  . clobetasol cream (TEMOVATE) 0.05 %       . Clobetasol Prop Oint-Coal Tar (CLOBETAPLUS OINTMENT EX) Apply topically 2 (two) times daily as needed.       Marland Kitchen esomeprazole (NEXIUM) 40 MG capsule Take 1 capsule (40 mg total) by mouth daily at 2 PM daily at 2 PM.  30 capsule  11  . Eszopiclone (ESZOPICLONE) 3 MG TABS Take 1 tablet (3 mg total) by mouth at bedtime. Take immediately before bedtime  30 tablet  5  . gabapentin (NEURONTIN) 300 MG capsule Take 1 capsule (300 mg total) by mouth at bedtime. Add this 300 mg dose to her pm dose of 600mg  to make 900 mg qhs dose   30 capsule  5  . gabapentin (NEURONTIN) 600 MG tablet Take 600 mg by mouth 3 (three) times daily.        . hydrOXYzine (ATARAX/VISTARIL) 10 MG tablet take 1 tablet by mouth every 6 hours if needed for itching  50 tablet  0  . Magnesium 250 MG TABS Take by mouth every morning. Dr. Phylliss Bob       . Multiple Vitamin (MULITIVITAMIN WITH MINERALS) TABS Take 1 tablet by mouth daily.      . nortriptyline (PAMELOR) 25 MG capsule Take 2 capsules (50 mg total) by mouth at bedtime.  30 capsule  11  . PREMARIN 0.45 MG tablet Take 1 tablet (0.45 mg total) by mouth as directed. Take daily for 21 days then do not take for 7 days.  21 tablet  2  . Probiotic Product (ALIGN) 4 MG CAPS Take 4 mg by mouth daily.        . psyllium (METAMUCIL SMOOTH TEXTURE) 58.6 % powder Take 1 packet by mouth 3 (three) times daily.  283 g  12  . ranitidine (ZANTAC) 300 MG tablet Take 1 tablet (300 mg total) by mouth at bedtime.  30 tablet  3  .  simvastatin (ZOCOR) 40 MG tablet Take 1 tablet (40 mg total) by mouth every evening.  30 tablet  11  . torsemide (DEMADEX) 10 MG tablet Take 10 mg by mouth every morning.       . traMADol (ULTRAM) 50 MG tablet Take 1 tablet (50 mg total) by mouth every 6 (six) hours as needed for pain.  120 tablet  1  . ZETIA 10 MG tablet TAKE 1 TABLET EACH DAY  30 tablet  1    Allergies  Allergen Reactions  . Alendronate Sodium Other (See Comments)    Severe chest pain similar to heart attack   . Cefuroxime Axetil Other (See Comments)    unknown  . Codeine Nausea And Vomiting  . Macrodantin   . Meperidine Hcl Nausea And Vomiting  . Other Other (See Comments)    Hismanal and Maprobamate  portobello mushrooms - diarrhea  . Sulfonamide Derivatives Hives, Itching and Swelling    Patient Active Problem List  Diagnosis  . HYPOTHYROIDISM  . HYPERLIPIDEMIA  . ANEMIA  . GERD  . IRRITABLE BOWEL SYNDROME  . Bilateral knee pain  . DEGENERATIVE DISC DISEASE, LUMBOSACRAL SPINE  . CERVICAL RADICULOPATHY    . BURSITIS, ACROMIOCLAVICULAR, LEFT  . FIBROMYALGIA  . OSTEOPENIA  . DIARRHEA, RECURRENT  . VERTEBRAL FRACTURE  . LUMBOSACRAL STRAIN, ACUTE  . CAROTID BRUIT  . Edema  . Multinodular thyroid  . Palpitations  . Cervical cancer screening  . Thrush  . Vaginitis  . Sigmoid colon ulcer  . Migraine  . Hyperglycemia    History  Smoking status  . Former Smoker -- 4 years  . Types: Cigarettes  . Quit date: 09/22/1990  Smokeless tobacco  . Never Used    History  Alcohol Use  . 8.4 oz/week  . 14 Glasses of wine per week    WINE WITH DINNER. 2 GLASSES RED WINE    Family History  Problem Relation Age of Onset  . Arthritis      family hx  . Cancer      family hx  . Stroke    . Mental illness    . Hyperlipidemia    . Hypertension    . Cancer Mother   . Stomach cancer Mother   . Stroke Father   . Colon cancer Father   . HIV Son     Aids  . Anesthesia problems Neg Hx   . Stomach cancer Maternal Grandfather   . Stomach cancer Paternal Grandmother     Review of Systems: Constitutional: no fever chills diaphoresis or fatigue or change in weight.  Head and neck: no hearing loss, no epistaxis, no photophobia or visual disturbance. Respiratory: No cough, shortness of breath or wheezing. Cardiovascular: No chest pain peripheral edema, palpitations. Gastrointestinal: No abdominal distention, no abdominal pain, no change in bowel habits hematochezia or melena. Genitourinary: No dysuria, no frequency, no urgency, no nocturia. Musculoskeletal:No arthralgias, no back pain, no gait disturbance or myalgias. Neurological: No dizziness, no headaches, no numbness, no seizures, no syncope, no weakness, no tremors. Hematologic: No lymphadenopathy, no easy bruising. Psychiatric: No confusion, no hallucinations, no sleep disturbance.    Physical Exam: Filed Vitals:   04/29/12 1047  BP: 130/84  Pulse: 59   the general appearance feels a well-developed well-nourished woman in no  distress.  She has evidence of a healing incision in the left lower anterior neck from her prior surgery.The head and neck exam reveals pupils equal and reactive.  Extraocular movements are full.  There is no scleral icterus.  The mouth and pharynx are normal.  The neck is supple.  The carotids reveal no bruits.  The jugular venous pressure is normal.  The  thyroid is not enlarged.  There is no lymphadenopathy.  The chest is clear to percussion and auscultation.  There are no rales or rhonchi.  Expansion of the chest is symmetrical.  The precordium is quiet.  The first heart sound is normal.  The second heart sound is physiologically split.  There is no murmur gallop rub or click.  There is no abnormal lift or heave.  The abdomen is soft and nontender.  The bowel sounds are normal.  The liver and spleen are not enlarged.  There are no abdominal masses.  There are no abdominal bruits.  Extremities reveal good pedal pulses.  There is no phlebitis but there is chronic pitting edema of the right ankle.  There is no cyanosis or clubbing.  Strength is normal and symmetrical in all extremities.  There is no lateralizing weakness.  There are no sensory deficits.  The skin is warm and dry.  There is no rash.  EKG today shows sinus bradycardia and no ischemic changes.   Assessment / Plan: The patient is stable from the cardiac standpoint.  She is cleared for upcoming right total knee surgery in November.

## 2012-04-30 ENCOUNTER — Other Ambulatory Visit: Payer: Self-pay

## 2012-04-30 ENCOUNTER — Other Ambulatory Visit: Payer: Self-pay | Admitting: Family Medicine

## 2012-04-30 MED ORDER — GABAPENTIN 600 MG PO TABS: 600.0000 mg | ORAL_TABLET | Freq: Three times a day (TID) | ORAL | Status: DC

## 2012-04-30 NOTE — Telephone Encounter (Signed)
Refill request for Gabapentin 600 mg? There is 300 mg and 600 mg on patients med list? Which one is she supposed to be taking?

## 2012-04-30 NOTE — Telephone Encounter (Signed)
Both. We just added the 300mg  tab just to her night time dose and it says that on the sig. She was already on the 600mg  tid and she needs to continue that.

## 2012-05-04 ENCOUNTER — Other Ambulatory Visit: Payer: Self-pay | Admitting: Family Medicine

## 2012-05-04 MED ORDER — TORSEMIDE 10 MG PO TABS: 10.0000 mg | ORAL_TABLET | ORAL | Status: DC

## 2012-05-04 NOTE — Telephone Encounter (Signed)
RX sent to pharmacy  

## 2012-05-11 ENCOUNTER — Other Ambulatory Visit: Payer: Self-pay | Admitting: Family Medicine

## 2012-05-31 ENCOUNTER — Other Ambulatory Visit: Payer: Self-pay | Admitting: Family Medicine

## 2012-06-01 DIAGNOSIS — Z23 Encounter for immunization: Secondary | ICD-10-CM | POA: Diagnosis not present

## 2012-06-07 DIAGNOSIS — M1990 Unspecified osteoarthritis, unspecified site: Secondary | ICD-10-CM | POA: Diagnosis not present

## 2012-06-07 DIAGNOSIS — IMO0001 Reserved for inherently not codable concepts without codable children: Secondary | ICD-10-CM | POA: Diagnosis not present

## 2012-06-07 DIAGNOSIS — M810 Age-related osteoporosis without current pathological fracture: Secondary | ICD-10-CM | POA: Diagnosis not present

## 2012-06-08 ENCOUNTER — Other Ambulatory Visit: Payer: Self-pay | Admitting: Family Medicine

## 2012-06-21 ENCOUNTER — Telehealth: Payer: Self-pay | Admitting: Family Medicine

## 2012-06-21 NOTE — Telephone Encounter (Signed)
Ok

## 2012-06-21 NOTE — Telephone Encounter (Signed)
Pt would like switch from Dr Abner Greenspan to Dr Selena Batten due to travel time. Can I sch?

## 2012-06-21 NOTE — Telephone Encounter (Signed)
Of course!

## 2012-06-22 NOTE — Telephone Encounter (Signed)
done

## 2012-06-24 ENCOUNTER — Other Ambulatory Visit: Payer: Self-pay | Admitting: Family Medicine

## 2012-06-25 ENCOUNTER — Other Ambulatory Visit: Payer: Self-pay | Admitting: Family Medicine

## 2012-06-25 MED ORDER — NORTRIPTYLINE HCL 25 MG PO CAPS: 50.0000 mg | ORAL_CAPSULE | Freq: Every day | ORAL | Status: DC

## 2012-06-25 NOTE — Telephone Encounter (Signed)
RX sent

## 2012-06-29 ENCOUNTER — Other Ambulatory Visit: Payer: Self-pay | Admitting: Family Medicine

## 2012-06-30 DIAGNOSIS — M542 Cervicalgia: Secondary | ICD-10-CM | POA: Diagnosis not present

## 2012-07-01 ENCOUNTER — Other Ambulatory Visit: Payer: Self-pay | Admitting: Neurological Surgery

## 2012-07-01 DIAGNOSIS — M542 Cervicalgia: Secondary | ICD-10-CM

## 2012-07-02 ENCOUNTER — Encounter: Payer: Self-pay | Admitting: Family Medicine

## 2012-07-02 ENCOUNTER — Ambulatory Visit (INDEPENDENT_AMBULATORY_CARE_PROVIDER_SITE_OTHER): Payer: Medicare Other | Admitting: Family Medicine

## 2012-07-02 VITALS — BP 112/68 | Temp 98.4°F | Ht 63.5 in | Wt 151.0 lb

## 2012-07-02 DIAGNOSIS — K589 Irritable bowel syndrome without diarrhea: Secondary | ICD-10-CM

## 2012-07-02 DIAGNOSIS — K219 Gastro-esophageal reflux disease without esophagitis: Secondary | ICD-10-CM | POA: Diagnosis not present

## 2012-07-02 DIAGNOSIS — IMO0001 Reserved for inherently not codable concepts without codable children: Secondary | ICD-10-CM

## 2012-07-02 DIAGNOSIS — E785 Hyperlipidemia, unspecified: Secondary | ICD-10-CM

## 2012-07-02 DIAGNOSIS — R7309 Other abnormal glucose: Secondary | ICD-10-CM

## 2012-07-02 DIAGNOSIS — R002 Palpitations: Secondary | ICD-10-CM

## 2012-07-02 DIAGNOSIS — E039 Hypothyroidism, unspecified: Secondary | ICD-10-CM | POA: Diagnosis not present

## 2012-07-02 DIAGNOSIS — R739 Hyperglycemia, unspecified: Secondary | ICD-10-CM

## 2012-07-02 NOTE — Progress Notes (Signed)
Chief Complaint  Patient presents with  . Establish Care    HPI: Here to establish care. Followed by Dr. Dareen Piano in rheumatology for osteoarthritis and fibromylagia (on neurontin, TCA, lunesta, celexa) and started on vicodin  3 weeks ago for primarily R knee pain 2 times per day on most days. She doesn't like taking this medication. The hope is that she will not need this long term after knee replacement surgery scheduled for December 2nd with [facility] orthopedics. Sees Dr. Terri Piedra in Dermatology for pruritis nudularis Dr. Danielle Dess follows her for cervical DDD with previous cervical surgeries, most recently C4-T1 anterior decompression arthrodesis revision. She reports ongoing issues with this that may require further surgical intervention. She reports Dr. Patty Sermons in cardiology follows her for a history of dyslipidemia and palpitations. Per review of chart she saw him in 04/2012 for cardiac optimization prior to upcoming surgery. She reports he has carotid artery artery dz that is followed by her cardiologist as well. Hx of hysterectomy - still gets pap smears - had last pap a few months ago with Dr. Rogelia Rohrer and normal. Regular annual mammograms - last mammo March of 2013. On premarin 21 days per month. Colonoscopy a few months ago with ulcers in colon and NSAIDs stopped but ok with her continuing aspirin- determined di not need another one. UTD on flu, tetanus and shingeles vaccine. Prediabetes: HgbA1c 6.0 in 03/2012 Reviewed recent labs done in 03/2012 and all looking good other then above.   ROS: See pertinent positives and negatives per HPI.  Past Medical History  Diagnosis Date  . Hyperlipidemia   . GERD (gastroesophageal reflux disease)   . Palpitation   . Carotid bruit     Right  . Edema   . Arthritis   . Dysrhythmia     paroxysmal A fib, irregular heartbeat  . PONV (postoperative nausea and vomiting)   . Hyperlipidemia 01/27/2012  . Allergy     portabella mushrooms/diarrhea    . Heart murmur   . Osteoporosis   . Fibromyalgia   . Thrush 03/23/2012  . Vaginitis 03/23/2012  . Sigmoid colon ulcer 03/23/2012  . Migraine 04/27/2012  . Hyperglycemia 04/27/2012    Family History  Problem Relation Age of Onset  . Arthritis      family hx  . Cancer      family hx  . Stroke    . Mental illness    . Hyperlipidemia    . Hypertension    . Cancer Mother   . Stomach cancer Mother   . Stroke Father   . Colon cancer Father   . HIV Son     Aids  . Anesthesia problems Neg Hx   . Stomach cancer Maternal Grandfather   . Stomach cancer Paternal Grandmother     History   Social History  . Marital Status: Divorced    Spouse Name: N/A    Number of Children: N/A  . Years of Education: N/A   Social History Main Topics  . Smoking status: Former Smoker -- 4 years    Types: Cigarettes    Quit date: 09/22/1990  . Smokeless tobacco: Never Used  . Alcohol Use: 8.4 oz/week    14 Glasses of wine per week     WINE WITH DINNER. 2 GLASSES RED WINE  . Drug Use: No  . Sexually Active: None   Other Topics Concern  . None   Social History Narrative  . None    Current outpatient prescriptions:aspirin EC 81 MG tablet,  Take 81 mg by mouth daily., Disp: , Rfl: ;  Biotin 5 MG CAPS, Take 1 capsule by mouth daily.  , Disp: , Rfl: ;  bisoprolol (ZEBETA) 5 MG tablet, Take 1 tablet (5 mg total) by mouth at bedtime., Disp: 30 tablet, Rfl: 5;  Calcium Carbonate-Vitamin D (CALCIUM 600+D) 600-200 MG-UNIT TABS, Take by mouth daily after breakfast. Dr. Phylliss Bob  , Disp: , Rfl:  citalopram (CELEXA) 10 MG tablet, take 1 tablet by mouth once daily, Disp: 100 tablet, Rfl: 0;  clobetasol cream (TEMOVATE) 0.05 %, , Disp: , Rfl: ;  Clobetasol Prop Oint-Coal Tar (CLOBETAPLUS OINTMENT EX), Apply topically 2 (two) times daily as needed. , Disp: , Rfl: ;  esomeprazole (NEXIUM) 40 MG capsule, Take 1 capsule (40 mg total) by mouth daily at 2 PM daily at 2 PM., Disp: 30 capsule, Rfl: 11 Eszopiclone (ESZOPICLONE) 3  MG TABS, Take 1 tablet (3 mg total) by mouth at bedtime. Take immediately before bedtime, Disp: 30 tablet, Rfl: 5;  gabapentin (NEURONTIN) 600 MG tablet, Take 1 tablet (600 mg total) by mouth 3 (three) times daily., Disp: 90 tablet, Rfl: 2;  HYDROcodone-acetaminophen (VICODIN) 5-500 MG per tablet, Take 1 tablet by mouth every 6 (six) hours as needed., Disp: , Rfl:  hydrOXYzine (ATARAX/VISTARIL) 10 MG tablet, take 1 tablet by mouth every 6 hours if needed for itching, Disp: 50 tablet, Rfl: 0;  Magnesium 250 MG TABS, Take by mouth every morning. Dr. Phylliss Bob , Disp: , Rfl: ;  Multiple Vitamin (MULITIVITAMIN WITH MINERALS) TABS, Take 1 tablet by mouth daily., Disp: , Rfl: ;  nortriptyline (PAMELOR) 25 MG capsule, Take 2 capsules (50 mg total) by mouth at bedtime., Disp: 30 capsule, Rfl: 5 PREMARIN 0.45 MG tablet, take 1 tablet by mouth once daily for 21 DAYS THEN DO NOT TAKE FOR 7 DAYS AS DIRECTED, Disp: 21 tablet, Rfl: 2;  Probiotic Product (ALIGN) 4 MG CAPS, Take 4 mg by mouth daily.  , Disp: , Rfl: ;  psyllium (METAMUCIL SMOOTH TEXTURE) 58.6 % powder, Take 1 packet by mouth 3 (three) times daily., Disp: 283 g, Rfl: 12;  simvastatin (ZOCOR) 40 MG tablet, Take 1 tablet (40 mg total) by mouth every evening., Disp: 30 tablet, Rfl: 11 torsemide (DEMADEX) 10 MG tablet, Take 1 tablet (10 mg total) by mouth every morning., Disp: 30 tablet, Rfl: 3;  ZETIA 10 MG tablet, TAKE 1 TABLET EACH DAY, Disp: 30 each, Rfl: 1;  DISCONTD: gabapentin (NEURONTIN) 300 MG capsule, Take 1 capsule (300 mg total) by mouth at bedtime. Add this 300 mg dose to her pm dose of 600mg  to make 900 mg qhs dose, Disp: 30 capsule, Rfl: 5  EXAM:  Filed Vitals:   07/02/12 1257  BP: 112/68  Temp: 98.4 F (36.9 C)    Body mass index is 26.33 kg/(m^2).  GENERAL: vitals reviewed and listed above, alert, oriented, appears well hydrated and in no acute distress  HEENT: atraumatic, conjunttiva clear, no obvious abnormalities on inspection of external  nose and ears  LUNGS: clear to auscultation bilaterally, no wheezes, rales or rhonchi, good air movement  CV: HRRR, I/VI SEM no peripheral edema  MS: walks with a cane  PSYCH: pleasant and cooperative, no obvious depression or anxiety  ASSESSMENT AND PLAN:  Discussed the following assessment and plan:  1. FIBROMYALGIA   2. HYPOTHYROIDISM   3. HYPERLIPIDEMIA   4. GERD   5. Irritable bowel syndrome   6. Palpitations   7. Hyperglycemia    Ms.  Hancock is a very pleasant 78 yo F with MMP reviewed today. She is followed by numerous specialist and reports is going to have TKR  in December. She recently had labs done and we reviewed these results. We reviewed and updated her PMH, FH, Meds and allergies. She is concerned about using narcotic pain medications long term. We discussed risks and benefits in use of these medications long term for MS pain. She is going to use these sparingly and attempt to wean off of them after her knee surgery as her other pain is tolerable. She is UTD on health maintenance. She had recent labs which we reviewed. We discussed her risk for diabetes and lifestyle measures to help prevent this. She will follow up with me after her surgery or sooner if any concerns.   Patient Instructions  Thank you for enrolling in MyChart. Please follow the instructions below to securely access your online medical record. MyChart allows you to send messages to your doctor, view your test results, renew your prescriptions, schedule appointments, and more.  How Do I Sign Up? 1. In your Internet browser, go to [email or web address] 2. Click on the New  User? link in the Sign In box.  3. Enter your MyChart Access Code exactly as it appears below. You will not need to use this code after you have completed the sign-up process. If you do not sign up before the expiration date, you must request a new code. MyChart Access Code: [email or web address] Expires: 08/01/2012  1:47  PM  4. Enter the last four digits of your Social Security Number (xxxx) and Date of Birth (mm/dd/yyyy) as indicated and click Next. You will be taken to the next sign-up page. 5. Create a MyChart ID. This will be your MyChart login ID and cannot be changed, so think of one that is secure and easy to remember. 6. Create a MyChart password. You can change your password at any time. 7. Enter your Password Reset Question and Answer and click Next. This can be used at a later time if you forget your password.  8. Select your communication preference, and if applicable enter your e-mail address. You will receive e-mail notification when new information is available in MyChart by choosing to receive e-mail notifications and filling in your e-mail. 9. Click Sign In. You can now view your medical record.   Additional Information If you have questions, you can email [email or web address] or call [phone] to talk to our MyChart staff. Remember, MyChart is NOT to be used for urgent needs. For medical emergencies, dial 911.      Chelsea Basque R.

## 2012-07-02 NOTE — Patient Instructions (Signed)
Thank you for enrolling in MyChart. Please follow the instructions below to securely access your online medical record. MyChart allows you to send messages to your doctor, view your test results, renew your prescriptions, schedule appointments, and more.  How Do I Sign Up? 1. In your Internet browser, go to [email or web address] 2. Click on the New  User? link in the Sign In box.  3. Enter your MyChart Access Code exactly as it appears below. You will not need to use this code after you have completed the sign-up process. If you do not sign up before the expiration date, you must request a new code. MyChart Access Code: [email or web address] Expires: 08/01/2012  1:47 PM  4. Enter the last four digits of your Social Security Number (xxxx) and Date of Birth (mm/dd/yyyy) as indicated and click Next. You will be taken to the next sign-up page. 5. Create a MyChart ID. This will be your MyChart login ID and cannot be changed, so think of one that is secure and easy to remember. 6. Create a MyChart password. You can change your password at any time. 7. Enter your Password Reset Question and Answer and click Next. This can be used at a later time if you forget your password.  8. Select your communication preference, and if applicable enter your e-mail address. You will receive e-mail notification when new information is available in MyChart by choosing to receive e-mail notifications and filling in your e-mail. 9. Click Sign In. You can now view your medical record.   Additional Information If you have questions, you can email [email or web address] or call [phone] to talk to our MyChart staff. Remember, MyChart is NOT to be used for urgent needs. For medical emergencies, dial 911.

## 2012-07-06 ENCOUNTER — Ambulatory Visit
Admission: RE | Admit: 2012-07-06 | Discharge: 2012-07-06 | Disposition: A | Discharge status: Home / Self Care | Payer: Medicare Other | Source: Ambulatory Visit | Attending: Neurological Surgery | Admitting: Neurological Surgery

## 2012-07-06 DIAGNOSIS — M542 Cervicalgia: Secondary | ICD-10-CM

## 2012-07-06 DIAGNOSIS — M4802 Spinal stenosis, cervical region: Secondary | ICD-10-CM | POA: Diagnosis not present

## 2012-07-08 DIAGNOSIS — M542 Cervicalgia: Secondary | ICD-10-CM | POA: Diagnosis not present

## 2012-07-09 ENCOUNTER — Other Ambulatory Visit: Payer: Self-pay | Admitting: Neurological Surgery

## 2012-07-09 DIAGNOSIS — M542 Cervicalgia: Secondary | ICD-10-CM

## 2012-07-12 ENCOUNTER — Other Ambulatory Visit: Payer: Self-pay | Admitting: Family Medicine

## 2012-07-12 ENCOUNTER — Ambulatory Visit
Admission: RE | Admit: 2012-07-12 | Discharge: 2012-07-12 | Disposition: A | Discharge status: Home / Self Care | Payer: Medicare Other | Source: Ambulatory Visit | Attending: Neurological Surgery | Admitting: Neurological Surgery

## 2012-07-12 DIAGNOSIS — M47812 Spondylosis without myelopathy or radiculopathy, cervical region: Secondary | ICD-10-CM | POA: Diagnosis not present

## 2012-07-12 DIAGNOSIS — M542 Cervicalgia: Secondary | ICD-10-CM

## 2012-07-14 ENCOUNTER — Other Ambulatory Visit: Payer: Self-pay | Admitting: Family Medicine

## 2012-07-14 ENCOUNTER — Telehealth: Payer: Self-pay | Admitting: Cardiology

## 2012-07-14 NOTE — Telephone Encounter (Signed)
Scheduled appointment for tomorrow with  Dr. Patty Sermons (seeing Dr Danielle Dess tomorrow afternoon)

## 2012-07-14 NOTE — Telephone Encounter (Signed)
New Problem:    Patient called in because she had a flare up of her HR problem and wanted to know if she could be seen today before her surgery with Dr. Danielle Dess.  Please call back. If she cannot be reached at home please try her cell phone.

## 2012-07-15 ENCOUNTER — Other Ambulatory Visit: Payer: Self-pay | Admitting: Orthopedic Surgery

## 2012-07-15 ENCOUNTER — Encounter: Payer: Self-pay | Admitting: Cardiology

## 2012-07-15 ENCOUNTER — Other Ambulatory Visit: Payer: Self-pay

## 2012-07-15 ENCOUNTER — Ambulatory Visit (INDEPENDENT_AMBULATORY_CARE_PROVIDER_SITE_OTHER): Payer: Medicare Other | Admitting: Cardiology

## 2012-07-15 VITALS — BP 132/72 | HR 62 | Ht 63.5 in | Wt 149.8 lb

## 2012-07-15 DIAGNOSIS — M5412 Radiculopathy, cervical region: Secondary | ICD-10-CM | POA: Diagnosis not present

## 2012-07-15 DIAGNOSIS — M4712 Other spondylosis with myelopathy, cervical region: Secondary | ICD-10-CM | POA: Diagnosis not present

## 2012-07-15 DIAGNOSIS — R002 Palpitations: Secondary | ICD-10-CM

## 2012-07-15 MED ORDER — DEXAMETHASONE SODIUM PHOSPHATE 10 MG/ML IJ SOLN: 10.0000 mg | Freq: Once | INTRAMUSCULAR | Status: DC

## 2012-07-15 MED ORDER — BUPIVACAINE 0.25 % ON-Q PUMP SINGLE CATH 300ML: 300.0000 mL | INJECTION | Status: DC

## 2012-07-15 MED ORDER — CITALOPRAM HYDROBROMIDE 10 MG PO TABS: 10.0000 mg | ORAL_TABLET | Freq: Every day | ORAL | Status: DC

## 2012-07-15 NOTE — Patient Instructions (Addendum)
Your physician recommends that you continue on your current medications as directed. Please refer to the Current Medication list given to you today.  Your physician wants you to follow-up in: 6 months. You will receive a reminder letter in the mail two months in advance. If you don't receive a letter, please call our office to schedule the follow-up appointment.  

## 2012-07-15 NOTE — Assessment & Plan Note (Signed)
2 nights ago the patient had an episode of palpitations which lasted about 8 hours.  The night before the palpitations started she had eaten a lot of highly spiced [place] food he went to bed feeling "stuffed".  She awoke several hours later with palpitations.  She did not think to take an extra bisoprolol but she could have done that.

## 2012-07-15 NOTE — Assessment & Plan Note (Signed)
The patient has an appointment to see her neurosurgeon Dr. Danielle Dess this afternoon.  She anticipates the need for more neck surgery.  She is still having a lot of numbness and tingling going down her arms. From the cardiac standpoint she is stable for surgery

## 2012-07-15 NOTE — Progress Notes (Signed)
Remo Lipps Date of Birth:  1934/04/20 [facility] [address] [place], [place]  [address] [phone]         Fax   [phone]  History of Present Illness: This pleasant 78 year old woman is seen for a scheduled followup office visit.  She has a past history of dyslipidemia and a history of palpitations.  Since last visit she has been doing reasonably well from the cardiac standpoint.  She does have episodes of tachycardia perhaps once a month or so.  Normally they last about 2 hours and then subsided without specific therapy.  Current Outpatient Prescriptions  Medication Sig Dispense Refill  . aspirin EC 81 MG tablet Take 81 mg by mouth daily.      . Biotin 5 MG CAPS Take 1 capsule by mouth daily.        . bisoprolol (ZEBETA) 5 MG tablet Take 1 tablet (5 mg total) by mouth at bedtime.  30 tablet  5  . Calcium Carbonate-Vitamin D (CALCIUM 600+D) 600-200 MG-UNIT TABS Take by mouth daily after breakfast. Dr. Phylliss Bob        . Clobetasol Prop Oint-Coal Tar (CLOBETAPLUS OINTMENT EX) Apply topically 2 (two) times daily as needed.       Marland Kitchen esomeprazole (NEXIUM) 40 MG capsule Take 1 capsule (40 mg total) by mouth daily at 2 PM daily at 2 PM.  30 capsule  11  . Eszopiclone (ESZOPICLONE) 3 MG TABS Take 1 tablet (3 mg total) by mouth at bedtime. Take immediately before bedtime  30 tablet  5  . gabapentin (NEURONTIN) 600 MG tablet Take 1 tablet (600 mg total) by mouth 3 (three) times daily.  90 tablet  2  . HYDROcodone-acetaminophen (VICODIN) 5-500 MG per tablet Take 1 tablet by mouth every 6 (six) hours as needed.      . hydrOXYzine (ATARAX/VISTARIL) 10 MG tablet take 1 tablet by mouth every 6 hours if needed for itching  50 tablet  0  . Magnesium 250 MG TABS Take by mouth every morning. Dr. Phylliss Bob       . Multiple Vitamin (MULITIVITAMIN WITH MINERALS) TABS Take 1 tablet by mouth daily.      . NON FORMULARY Mega Red      . nortriptyline (PAMELOR) 25 MG capsule Take 2  capsules (50 mg total) by mouth at bedtime.  30 capsule  5  . PREMARIN 0.45 MG tablet take 1 tablet by mouth once daily for 21 DAYS THEN DO NOT TAKE FOR 7 DAYS AS DIRECTED  21 tablet  2  . Probiotic Product (ALIGN) 4 MG CAPS Take 4 mg by mouth daily.        . ranitidine (ZANTAC) 300 MG tablet       . simvastatin (ZOCOR) 40 MG tablet Take 1 tablet (40 mg total) by mouth every evening.  30 tablet  11  . torsemide (DEMADEX) 10 MG tablet Take 1 tablet (10 mg total) by mouth every morning.  30 tablet  3  . ZETIA 10 MG tablet TAKE 1 TABLET EACH DAY  30 each  1  . zoledronic acid (RECLAST) 5 MG/100ML SOLN Inject 5 mg into the vein once.      . citalopram (CELEXA) 10 MG tablet Take 1 tablet (10 mg total) by mouth daily.  90 tablet  1   No current facility-administered medications for this visit.   Facility-Administered Medications Ordered in Other Visits  Medication Dose Route Frequency Provider Last Rate Last Dose  .  bupivacaine 0.25 % ON-Q pump SINGLE CATH 300 mL  300 mL Other Continuous Alexzandrew Perkins, PA      . dexamethasone (DECADRON) injection 10 mg  10 mg Intravenous Once Alexzandrew Perkins, PA        Allergies  Allergen Reactions  . Alendronate Sodium Other (See Comments)    Severe chest pain similar to heart attack   . Cefuroxime Axetil Other (See Comments)    unknown  . Codeine Nausea And Vomiting  . Macrodantin   . Meperidine Hcl Nausea And Vomiting  . Other Other (See Comments)    Hismanal and Maprobamate  portobello mushrooms - diarrhea  . Sulfonamide Derivatives Hives, Itching and Swelling    Patient Active Problem List  Diagnosis  . HYPOTHYROIDISM  . HYPERLIPIDEMIA  . ANEMIA  . GERD  . IRRITABLE BOWEL SYNDROME  . Bilateral knee pain  . DEGENERATIVE DISC DISEASE, LUMBOSACRAL SPINE  . CERVICAL RADICULOPATHY  . BURSITIS, ACROMIOCLAVICULAR, LEFT  . FIBROMYALGIA  . OSTEOPENIA  . DIARRHEA, RECURRENT  . VERTEBRAL FRACTURE  . LUMBOSACRAL STRAIN, ACUTE  . CAROTID  BRUIT  . Edema  . Multinodular thyroid  . Palpitations  . Cervical cancer screening  . Thrush  . Vaginitis  . Sigmoid colon ulcer  . Migraine  . Hyperglycemia    History  Smoking status  . Former Smoker -- 4 years  . Types: Cigarettes  . Quit date: 09/22/1990  Smokeless tobacco  . Never Used    History  Alcohol Use  . 8.4 oz/week  . 14 Glasses of wine per week    WINE WITH DINNER. 2 GLASSES RED WINE    Family History  Problem Relation Age of Onset  . Arthritis      family hx  . Cancer      family hx  . Stroke    . Mental illness    . Hyperlipidemia    . Hypertension    . Cancer Mother   . Stomach cancer Mother   . Stroke Father   . Colon cancer Father   . HIV Son     Aids  . Anesthesia problems Neg Hx   . Stomach cancer Maternal Grandfather   . Stomach cancer Paternal Grandmother     Review of Systems: Constitutional: no fever chills diaphoresis or fatigue or change in weight.  Head and neck: no hearing loss, no epistaxis, no photophobia or visual disturbance. Respiratory: No cough, shortness of breath or wheezing. Cardiovascular: No chest pain peripheral edema, palpitations. Gastrointestinal: No abdominal distention, no abdominal pain, no change in bowel habits hematochezia or melena. Genitourinary: No dysuria, no frequency, no urgency, no nocturia. Musculoskeletal:No arthralgias, no back pain, no gait disturbance or myalgias. Neurological: No dizziness, no headaches, no numbness, no seizures, no syncope, no weakness, no tremors. Hematologic: No lymphadenopathy, no easy bruising. Psychiatric: No confusion, no hallucinations, no sleep disturbance.    Physical Exam: Filed Vitals:   07/15/12 1004  BP: 132/72  Pulse: 62   the general appearance reveals a well-developed well-nourished woman in no distress.The head and neck exam reveals pupils equal and reactive.  Extraocular movements are full.  There is no scleral icterus.  The mouth and pharynx are  normal.  The neck is supple.  The carotids reveal no bruits.  The jugular venous pressure is normal.  The  thyroid is not enlarged.  There is no lymphadenopathy.  The chest is clear to percussion and auscultation.  There are no rales or rhonchi.  Expansion of  the chest is symmetrical.  The precordium is quiet.  The first heart sound is normal.  The second heart sound is physiologically split.  There is no murmur gallop rub or click.  There is no abnormal lift or heave.  The abdomen is soft and nontender.  The bowel sounds are normal.  The liver and spleen are not enlarged.  There are no abdominal masses.  There are no abdominal bruits.  Extremities reveal good pedal pulses.  There is no phlebitis or edema.  There is no cyanosis or clubbing.  Strength is normal and symmetrical in all extremities.  There is no lateralizing weakness.  There are no sensory deficits.  The skin is warm and dry.  There is no rash.  EKG today shows sinus rhythm with borderline left axis deviation and poor R-wave progression.  Since 04/29/12, no significant change   Assessment / Plan: The patient is cleared from a cardiac standpoint for neck surgery.  If she has subsequent recurrent rapid palpitations she should take an extra beta blocker when necessary.  Recheck here in 6 months for followup office visit.

## 2012-07-15 NOTE — Progress Notes (Addendum)
Preoperative surgical orders have been place into the Epic hospital system for Chelsea Hancock on 07/15/2012, 1:32 PM  by Patrica Duel for surgery on 08/16/12.  Preop Total Knee orders including Bupivacaine On-Q pump, IV Tylenol, and IV Decadron as long as there are no contraindications to the above medications. [name], PA-C

## 2012-07-15 NOTE — Telephone Encounter (Signed)
Rx request for citalopram 10 mg- take one tablet by mouth daily.  Rx last filled 04/08/12.  Ok per Dr.Kim to fill #[phone] x 1 rf.  Rx called in to pharmacy.

## 2012-07-20 ENCOUNTER — Other Ambulatory Visit: Payer: Self-pay | Admitting: Neurological Surgery

## 2012-07-21 ENCOUNTER — Other Ambulatory Visit: Payer: Self-pay | Admitting: Family Medicine

## 2012-07-23 ENCOUNTER — Encounter (HOSPITAL_COMMUNITY): Payer: Self-pay | Admitting: Pharmacy Technician

## 2012-07-28 ENCOUNTER — Encounter (HOSPITAL_COMMUNITY): Payer: Self-pay

## 2012-07-28 ENCOUNTER — Encounter (HOSPITAL_COMMUNITY)
Admission: RE | Admit: 2012-07-28 | Discharge: 2012-07-28 | Disposition: A | Discharge status: Home / Self Care | Payer: Medicare Other | Source: Ambulatory Visit | Attending: Neurological Surgery | Admitting: Neurological Surgery

## 2012-07-28 DIAGNOSIS — J984 Other disorders of lung: Secondary | ICD-10-CM | POA: Diagnosis not present

## 2012-07-28 HISTORY — DX: Irritable bowel syndrome, unspecified: K58.9

## 2012-07-28 HISTORY — DX: Diverticulosis of intestine, part unspecified, without perforation or abscess without bleeding: K57.90

## 2012-07-28 HISTORY — DX: Foot drop, right foot: M21.371

## 2012-07-28 HISTORY — DX: Dorsalgia, unspecified: M54.9

## 2012-07-28 HISTORY — DX: Other chronic pain: G89.29

## 2012-07-28 HISTORY — DX: Unspecified hemorrhoids: K64.9

## 2012-07-28 LAB — SURGICAL PCR SCREEN
MRSA, PCR: NEGATIVE
Staphylococcus aureus: NEGATIVE

## 2012-07-28 LAB — CBC
HCT: 40.3 % (ref 36.0–46.0)
Hemoglobin: 13.8 g/dL (ref 12.0–15.0)
MCH: 30.7 pg (ref 26.0–34.0)
MCHC: 34.2 g/dL (ref 30.0–36.0)
MCV: 89.8 fL (ref 78.0–100.0)
Platelets: 240 10*3/uL (ref 150–400)
RBC: 4.49 MIL/uL (ref 3.87–5.11)
RDW: 13 % (ref 11.5–15.5)
WBC: 9.9 10*3/uL (ref 4.0–10.5)

## 2012-07-28 LAB — BASIC METABOLIC PANEL
BUN: 26 mg/dL — ABNORMAL HIGH (ref 6–23)
CO2: 28 mEq/L (ref 19–32)
Calcium: 9.5 mg/dL (ref 8.4–10.5)
Chloride: 102 mEq/L (ref 96–112)
Creatinine, Ser: 0.83 mg/dL (ref 0.50–1.10)
GFR calc Af Amer: 78 mL/min — ABNORMAL LOW (ref >90)
GFR calc non Af Amer: 67 mL/min — ABNORMAL LOW (ref >90)
Glucose, Bld: 93 mg/dL (ref 70–99)
Potassium: 3.6 mEq/L (ref 3.5–5.1)
Sodium: 140 mEq/L (ref 135–145)

## 2012-07-28 LAB — TYPE AND SCREEN
ABO/RH(D): O POS
Antibody Screen: NEGATIVE

## 2012-07-28 NOTE — Pre-Procedure Instructions (Signed)
22 Chelsea Hancock  07/28/2012   Your procedure is scheduled on:  Tues, Nov 12 @ 11:35 AM  Report to [facility] at 8:30 AM.  Call this number if you have problems the morning of surgery: [phone]   Remember:   Do not eat food:After Midnight.    Take these medicines the morning of surgery with A SIP OF WATER: Bisoprolol(Zebeta),Citalopram(Celexa),Ranitidine(Zantac),Gabapentin(Neurontin),and Pain Pill(if needed)   Do not wear jewelry, make-up or nail polish.  Do not wear lotions, powders, or perfumes. You may wear deodorant.  Do not shave 48 hours prior to surgery.  Do not bring valuables to the hospital.  Contacts, dentures or bridgework may not be worn into surgery.  Leave suitcase in the car. After surgery it may be brought to your room.  For patients admitted to the hospital, checkout time is 11:00 AM the day of discharge.   Patients discharged the day of surgery will not be allowed to drive home.    Special Instructions: Shower using CHG 2 nights before surgery and the night before surgery.  If you shower the day of surgery use CHG.  Use special wash - you have one bottle of CHG for all showers.  You should use approximately 1/3 of the bottle for each shower.   Please read over the following fact sheets that you were given: Pain Booklet, Coughing and Deep Breathing, Blood Transfusion Information, MRSA Information and Surgical Site Infection Prevention

## 2012-07-28 NOTE — Progress Notes (Addendum)
Dr.Brackbill is cardiologist with last visit in epic from end of Oct 2013  Stress test done about 20+yrs ago  Denies ever having an echo or heart cath  Dr.Hannah Kim with [facility]  EKG in epic from 07/15/12  Last cxr in 01/2011

## 2012-07-30 ENCOUNTER — Other Ambulatory Visit: Payer: Self-pay | Admitting: Family Medicine

## 2012-08-02 MED ORDER — ACETAMINOPHEN 10 MG/ML IV SOLN
1000.0000 mg | Freq: Once | INTRAVENOUS | Status: AC
  Administered 2012-08-03: 1000 mg via INTRAVENOUS
  Filled 2012-08-02: qty 100

## 2012-08-02 MED ORDER — VANCOMYCIN HCL IN DEXTROSE 1-5 GM/200ML-% IV SOLN
1000.0000 mg | INTRAVENOUS | Status: AC
  Administered 2012-08-03: 1000 mg via INTRAVENOUS
  Filled 2012-08-02: qty 200

## 2012-08-02 NOTE — Progress Notes (Signed)
Nurse called patient to ensure that Dr. Verlee Rossetti office called about time change for surgery. Patient stated they called and told me to arrive at 0600. Patient thanked Nurse for calling just to check. Call ended.

## 2012-08-03 ENCOUNTER — Encounter (HOSPITAL_COMMUNITY): Payer: Self-pay | Admitting: *Deleted

## 2012-08-03 ENCOUNTER — Encounter (HOSPITAL_COMMUNITY): Payer: Self-pay | Admitting: Anesthesiology

## 2012-08-03 ENCOUNTER — Inpatient Hospital Stay (HOSPITAL_COMMUNITY)
Admission: RE | Admit: 2012-08-03 | Discharge: 2012-08-10 | DRG: 472 | Disposition: A | Discharge status: Home / Self Care | Payer: Medicare Other | Source: Ambulatory Visit | Attending: Neurological Surgery | Admitting: Neurological Surgery

## 2012-08-03 ENCOUNTER — Encounter (HOSPITAL_COMMUNITY)
Admission: RE | Disposition: A | Discharge status: Home / Self Care | Payer: Self-pay | Source: Ambulatory Visit | Attending: Neurological Surgery

## 2012-08-03 ENCOUNTER — Inpatient Hospital Stay (HOSPITAL_COMMUNITY): Payer: Medicare Other | Admitting: Anesthesiology

## 2012-08-03 ENCOUNTER — Inpatient Hospital Stay (HOSPITAL_COMMUNITY): Payer: Medicare Other

## 2012-08-03 DIAGNOSIS — K219 Gastro-esophageal reflux disease without esophagitis: Secondary | ICD-10-CM | POA: Diagnosis present

## 2012-08-03 DIAGNOSIS — Z981 Arthrodesis status: Secondary | ICD-10-CM | POA: Diagnosis not present

## 2012-08-03 DIAGNOSIS — IMO0001 Reserved for inherently not codable concepts without codable children: Secondary | ICD-10-CM | POA: Diagnosis present

## 2012-08-03 DIAGNOSIS — M810 Age-related osteoporosis without current pathological fracture: Secondary | ICD-10-CM | POA: Diagnosis present

## 2012-08-03 DIAGNOSIS — M5412 Radiculopathy, cervical region: Secondary | ICD-10-CM | POA: Diagnosis not present

## 2012-08-03 DIAGNOSIS — M6281 Muscle weakness (generalized): Secondary | ICD-10-CM | POA: Diagnosis not present

## 2012-08-03 DIAGNOSIS — E785 Hyperlipidemia, unspecified: Secondary | ICD-10-CM | POA: Diagnosis present

## 2012-08-03 DIAGNOSIS — M542 Cervicalgia: Secondary | ICD-10-CM | POA: Diagnosis not present

## 2012-08-03 DIAGNOSIS — M47812 Spondylosis without myelopathy or radiculopathy, cervical region: Secondary | ICD-10-CM | POA: Diagnosis not present

## 2012-08-03 DIAGNOSIS — R443 Hallucinations, unspecified: Secondary | ICD-10-CM | POA: Diagnosis not present

## 2012-08-03 DIAGNOSIS — Z96649 Presence of unspecified artificial hip joint: Secondary | ICD-10-CM | POA: Diagnosis not present

## 2012-08-03 DIAGNOSIS — M25519 Pain in unspecified shoulder: Secondary | ICD-10-CM | POA: Diagnosis present

## 2012-08-03 DIAGNOSIS — Z01812 Encounter for preprocedural laboratory examination: Secondary | ICD-10-CM

## 2012-08-03 DIAGNOSIS — G959 Disease of spinal cord, unspecified: Secondary | ICD-10-CM

## 2012-08-03 DIAGNOSIS — G8250 Quadriplegia, unspecified: Secondary | ICD-10-CM | POA: Diagnosis not present

## 2012-08-03 DIAGNOSIS — IMO0002 Reserved for concepts with insufficient information to code with codable children: Secondary | ICD-10-CM | POA: Diagnosis not present

## 2012-08-03 DIAGNOSIS — M4712 Other spondylosis with myelopathy, cervical region: Secondary | ICD-10-CM | POA: Diagnosis not present

## 2012-08-03 HISTORY — PX: POSTERIOR CERVICAL FUSION/FORAMINOTOMY: SHX5038

## 2012-08-03 SURGERY — POSTERIOR CERVICAL FUSION/FORAMINOTOMY LEVEL 5
Anesthesia: General | Site: Neck | Wound class: Clean

## 2012-08-03 MED ORDER — ACETAMINOPHEN 325 MG PO TABS
650.0000 mg | ORAL_TABLET | ORAL | Status: DC | PRN
  Administered 2012-08-05: 650 mg via ORAL
  Filled 2012-08-03: qty 2

## 2012-08-03 MED ORDER — MENTHOL 3 MG MT LOZG: 1.0000 | LOZENGE | OROMUCOSAL | Status: DC | PRN

## 2012-08-03 MED ORDER — MORPHINE SULFATE 2 MG/ML IJ SOLN
1.0000 mg | INTRAMUSCULAR | Status: DC | PRN
  Administered 2012-08-04 – 2012-08-06 (×4): 2 mg via INTRAVENOUS
  Filled 2012-08-03 (×4): qty 1

## 2012-08-03 MED ORDER — PANTOPRAZOLE SODIUM 40 MG PO TBEC
80.0000 mg | DELAYED_RELEASE_TABLET | Freq: Every day | ORAL | Status: DC
  Administered 2012-08-04 – 2012-08-10 (×7): 80 mg via ORAL
  Filled 2012-08-03 (×4): qty 2
  Filled 2012-08-03 (×2): qty 1
  Filled 2012-08-03 (×2): qty 2

## 2012-08-03 MED ORDER — PHENYLEPHRINE HCL 10 MG/ML IJ SOLN: 10.0000 mg | INTRAVENOUS | Status: DC | PRN

## 2012-08-03 MED ORDER — OXYCODONE-ACETAMINOPHEN 5-325 MG PO TABS
1.0000 | ORAL_TABLET | ORAL | Status: DC | PRN
  Administered 2012-08-03 – 2012-08-04 (×4): 2 via ORAL
  Administered 2012-08-05: 1 via ORAL
  Administered 2012-08-06 – 2012-08-08 (×7): 2 via ORAL
  Administered 2012-08-09: 1 via ORAL
  Administered 2012-08-10 (×2): 2 via ORAL
  Filled 2012-08-03 (×9): qty 2
  Filled 2012-08-03 (×2): qty 1
  Filled 2012-08-03 (×5): qty 2

## 2012-08-03 MED ORDER — BISOPROLOL FUMARATE 5 MG PO TABS
5.0000 mg | ORAL_TABLET | Freq: Every day | ORAL | Status: DC
  Administered 2012-08-03 – 2012-08-09 (×6): 5 mg via ORAL
  Filled 2012-08-03 (×9): qty 1

## 2012-08-03 MED ORDER — PHENOL 1.4 % MT LIQD
1.0000 | OROMUCOSAL | Status: DC | PRN
  Filled 2012-08-03: qty 177

## 2012-08-03 MED ORDER — VANCOMYCIN HCL IN DEXTROSE 1-5 GM/200ML-% IV SOLN
1000.0000 mg | Freq: Three times a day (TID) | INTRAVENOUS | Status: AC
  Administered 2012-08-03 – 2012-08-04 (×2): 1000 mg via INTRAVENOUS
  Filled 2012-08-03 (×2): qty 200

## 2012-08-03 MED ORDER — LACTATED RINGERS IV SOLN
INTRAVENOUS | Status: DC | PRN
  Administered 2012-08-03 (×4): via INTRAVENOUS

## 2012-08-03 MED ORDER — DEXAMETHASONE SODIUM PHOSPHATE 4 MG/ML IJ SOLN
INTRAMUSCULAR | Status: DC | PRN
  Administered 2012-08-03: 4 mg via INTRAVENOUS

## 2012-08-03 MED ORDER — HYDROMORPHONE HCL PF 1 MG/ML IJ SOLN: 0.2500 mg | INTRAMUSCULAR | Status: DC | PRN

## 2012-08-03 MED ORDER — SODIUM CHLORIDE 0.9 % IR SOLN
Status: DC | PRN
  Administered 2012-08-03: 13:00:00

## 2012-08-03 MED ORDER — ONDANSETRON HCL 4 MG/2ML IJ SOLN
INTRAMUSCULAR | Status: DC | PRN
  Administered 2012-08-03: 4 mg via INTRAVENOUS

## 2012-08-03 MED ORDER — SODIUM CHLORIDE 0.9 % IJ SOLN: 3.0000 mL | INTRAMUSCULAR | Status: DC | PRN

## 2012-08-03 MED ORDER — HYDROXYZINE HCL 10 MG PO TABS
10.0000 mg | ORAL_TABLET | Freq: Every evening | ORAL | Status: DC | PRN
  Filled 2012-08-03 (×3): qty 3

## 2012-08-03 MED ORDER — CITALOPRAM HYDROBROMIDE 10 MG PO TABS
10.0000 mg | ORAL_TABLET | Freq: Every day | ORAL | Status: DC
  Administered 2012-08-03 – 2012-08-10 (×8): 10 mg via ORAL
  Filled 2012-08-03 (×9): qty 1

## 2012-08-03 MED ORDER — THROMBIN 5000 UNITS EX KIT
PACK | CUTANEOUS | Status: DC | PRN
  Administered 2012-08-03 (×4): 5000 [IU] via TOPICAL

## 2012-08-03 MED ORDER — ZOLPIDEM TARTRATE 5 MG PO TABS
5.0000 mg | ORAL_TABLET | Freq: Every evening | ORAL | Status: DC | PRN
  Administered 2012-08-03: 5 mg via ORAL
  Filled 2012-08-03: qty 1

## 2012-08-03 MED ORDER — SODIUM CHLORIDE 0.9 % IV SOLN
INTRAVENOUS | Status: AC
  Filled 2012-08-03: qty 500

## 2012-08-03 MED ORDER — VECURONIUM BROMIDE 10 MG IV SOLR
INTRAVENOUS | Status: DC | PRN
  Administered 2012-08-03: 3 mg via INTRAVENOUS
  Administered 2012-08-03: 2 mg via INTRAVENOUS
  Administered 2012-08-03: 1 mg via INTRAVENOUS
  Administered 2012-08-03: 5 mg via INTRAVENOUS

## 2012-08-03 MED ORDER — ONDANSETRON HCL 4 MG/2ML IJ SOLN: 4.0000 mg | INTRAMUSCULAR | Status: DC | PRN

## 2012-08-03 MED ORDER — LIDOCAINE-EPINEPHRINE 1 %-1:100000 IJ SOLN
INTRAMUSCULAR | Status: DC | PRN
  Administered 2012-08-03: 7 mL

## 2012-08-03 MED ORDER — LIDOCAINE HCL 4 % MT SOLN
OROMUCOSAL | Status: DC | PRN
  Administered 2012-08-03: 4 mL via TOPICAL

## 2012-08-03 MED ORDER — PHENYLEPHRINE HCL 10 MG/ML IJ SOLN
10.0000 mg | INTRAVENOUS | Status: DC | PRN
  Administered 2012-08-03: 14:00:00 via INTRAVENOUS
  Administered 2012-08-03: 25 ug/min via INTRAVENOUS
  Administered 2012-08-03: 10 ug/min via INTRAVENOUS

## 2012-08-03 MED ORDER — GLYCOPYRROLATE 0.2 MG/ML IJ SOLN
INTRAMUSCULAR | Status: DC | PRN
  Administered 2012-08-03: 0.2 mg via INTRAVENOUS
  Administered 2012-08-03: .6 mg via INTRAVENOUS
  Administered 2012-08-03: 0.4 mg via INTRAVENOUS

## 2012-08-03 MED ORDER — FENTANYL CITRATE 0.05 MG/ML IJ SOLN
INTRAMUSCULAR | Status: DC | PRN
  Administered 2012-08-03 (×3): 50 ug via INTRAVENOUS
  Administered 2012-08-03: 100 ug via INTRAVENOUS
  Administered 2012-08-03: 50 ug via INTRAVENOUS

## 2012-08-03 MED ORDER — FAMOTIDINE 20 MG PO TABS
20.0000 mg | ORAL_TABLET | Freq: Two times a day (BID) | ORAL | Status: DC
  Administered 2012-08-03 – 2012-08-10 (×14): 20 mg via ORAL
  Filled 2012-08-03 (×16): qty 1

## 2012-08-03 MED ORDER — HEMOSTATIC AGENTS (NO CHARGE) OPTIME
TOPICAL | Status: DC | PRN
  Administered 2012-08-03 (×2): 1 via TOPICAL

## 2012-08-03 MED ORDER — BUPIVACAINE HCL 0.5 % IJ SOLN
INTRAMUSCULAR | Status: DC | PRN
  Administered 2012-08-03: 7 mL
  Administered 2012-08-03: 10 mL

## 2012-08-03 MED ORDER — HYDROCODONE-ACETAMINOPHEN 5-325 MG PO TABS
1.0000 | ORAL_TABLET | ORAL | Status: DC | PRN
  Administered 2012-08-04 (×3): 1 via ORAL
  Administered 2012-08-05 (×2): 2 via ORAL
  Filled 2012-08-03: qty 1
  Filled 2012-08-03 (×3): qty 2
  Filled 2012-08-03: qty 1

## 2012-08-03 MED ORDER — DIAZEPAM 5 MG PO TABS
5.0000 mg | ORAL_TABLET | Freq: Four times a day (QID) | ORAL | Status: DC | PRN
  Administered 2012-08-03 – 2012-08-09 (×15): 5 mg via ORAL
  Filled 2012-08-03 (×15): qty 1

## 2012-08-03 MED ORDER — SODIUM CHLORIDE 0.9 % IJ SOLN
3.0000 mL | Freq: Two times a day (BID) | INTRAMUSCULAR | Status: DC
  Administered 2012-08-04 – 2012-08-09 (×8): 3 mL via INTRAVENOUS

## 2012-08-03 MED ORDER — LIDOCAINE HCL (CARDIAC) 20 MG/ML IV SOLN
INTRAVENOUS | Status: DC | PRN
  Administered 2012-08-03: 50 mg via INTRAVENOUS

## 2012-08-03 MED ORDER — SIMVASTATIN 40 MG PO TABS
40.0000 mg | ORAL_TABLET | Freq: Every evening | ORAL | Status: DC
  Administered 2012-08-03 – 2012-08-09 (×7): 40 mg via ORAL
  Filled 2012-08-03 (×8): qty 1

## 2012-08-03 MED ORDER — EPHEDRINE SULFATE 50 MG/ML IJ SOLN
INTRAMUSCULAR | Status: DC | PRN
  Administered 2012-08-03: 10 mg via INTRAVENOUS
  Administered 2012-08-03 (×2): 5 mg via INTRAVENOUS
  Administered 2012-08-03: 10 mg via INTRAVENOUS
  Administered 2012-08-03: 5 mg via INTRAVENOUS
  Administered 2012-08-03: 10 mg via INTRAVENOUS
  Administered 2012-08-03: 15 mg via INTRAVENOUS
  Administered 2012-08-03: 5 mg via INTRAVENOUS
  Administered 2012-08-03 (×2): 10 mg via INTRAVENOUS

## 2012-08-03 MED ORDER — EPINEPHRINE HCL 0.1 MG/ML IJ SOLN: INTRAMUSCULAR | Status: DC | PRN

## 2012-08-03 MED ORDER — ONDANSETRON HCL 4 MG/2ML IJ SOLN: 4.0000 mg | Freq: Once | INTRAMUSCULAR | Status: DC | PRN

## 2012-08-03 MED ORDER — CALCIUM CHLORIDE 10 % IV SOLN
INTRAVENOUS | Status: DC | PRN
  Administered 2012-08-03: 200 mg via INTRAVENOUS
  Administered 2012-08-03: 100 mg via INTRAVENOUS
  Administered 2012-08-03 (×3): 200 mg via INTRAVENOUS
  Administered 2012-08-03: 100 mg via INTRAVENOUS

## 2012-08-03 MED ORDER — NEOSTIGMINE METHYLSULFATE 1 MG/ML IJ SOLN
INTRAMUSCULAR | Status: DC | PRN
  Administered 2012-08-03: 4 mg via INTRAVENOUS

## 2012-08-03 MED ORDER — ACETAMINOPHEN 10 MG/ML IV SOLN
INTRAVENOUS | Status: AC
  Filled 2012-08-03: qty 100

## 2012-08-03 MED ORDER — BACITRACIN 50000 UNITS IM SOLR
INTRAMUSCULAR | Status: AC
  Filled 2012-08-03: qty 1

## 2012-08-03 MED ORDER — SODIUM CHLORIDE 0.9 % IV SOLN
INTRAVENOUS | Status: DC
  Administered 2012-08-03 – 2012-08-06 (×3): via INTRAVENOUS

## 2012-08-03 MED ORDER — ESTROGENS CONJUGATED 0.45 MG PO TABS
0.4500 mg | ORAL_TABLET | Freq: Every day | ORAL | Status: DC
  Administered 2012-08-04 – 2012-08-10 (×6): 0.45 mg via ORAL
  Filled 2012-08-03 (×8): qty 1

## 2012-08-03 MED ORDER — TORSEMIDE 10 MG PO TABS
10.0000 mg | ORAL_TABLET | ORAL | Status: DC
  Administered 2012-08-09: 10 mg via ORAL
  Filled 2012-08-03 (×9): qty 1

## 2012-08-03 MED ORDER — CEFAZOLIN SODIUM 1-5 GM-% IV SOLN: 1.0000 g | Freq: Three times a day (TID) | INTRAVENOUS | Status: DC

## 2012-08-03 MED ORDER — GABAPENTIN 600 MG PO TABS
600.0000 mg | ORAL_TABLET | Freq: Three times a day (TID) | ORAL | Status: DC
  Administered 2012-08-03 – 2012-08-09 (×18): 600 mg via ORAL
  Filled 2012-08-03 (×21): qty 1

## 2012-08-03 MED ORDER — NORTRIPTYLINE HCL 25 MG PO CAPS
50.0000 mg | ORAL_CAPSULE | Freq: Every day | ORAL | Status: DC
  Administered 2012-08-03 – 2012-08-09 (×7): 50 mg via ORAL
  Filled 2012-08-03 (×9): qty 2

## 2012-08-03 MED ORDER — SODIUM CHLORIDE 0.9 % IV SOLN: 250.0000 mL | INTRAVENOUS | Status: DC

## 2012-08-03 MED ORDER — 0.9 % SODIUM CHLORIDE (POUR BTL) OPTIME
TOPICAL | Status: DC | PRN
  Administered 2012-08-03: 1000 mL

## 2012-08-03 MED ORDER — ARTIFICIAL TEARS OP OINT
TOPICAL_OINTMENT | OPHTHALMIC | Status: DC | PRN
  Administered 2012-08-03: 1 via OPHTHALMIC

## 2012-08-03 MED ORDER — ALBUMIN HUMAN 5 % IV SOLN
INTRAVENOUS | Status: DC | PRN
  Administered 2012-08-03: 12:00:00 via INTRAVENOUS

## 2012-08-03 MED ORDER — PROPOFOL 10 MG/ML IV BOLUS
INTRAVENOUS | Status: DC | PRN
  Administered 2012-08-03: 20 mg via INTRAVENOUS
  Administered 2012-08-03: 150 mg via INTRAVENOUS

## 2012-08-03 MED ORDER — EZETIMIBE 10 MG PO TABS
10.0000 mg | ORAL_TABLET | Freq: Every day | ORAL | Status: DC
  Administered 2012-08-03 – 2012-08-09 (×7): 10 mg via ORAL
  Filled 2012-08-03 (×9): qty 1

## 2012-08-03 MED ORDER — ACETAMINOPHEN 650 MG RE SUPP: 650.0000 mg | RECTAL | Status: DC | PRN

## 2012-08-03 SURGICAL SUPPLY — 77 items
BAG DECANTER FOR FLEXI CONT (MISCELLANEOUS) ×2 IMPLANT
BENZOIN TINCTURE PRP APPL 2/3 (GAUZE/BANDAGES/DRESSINGS) IMPLANT
BIT DRILL NEURO 2X3.1 SFT TUCH (MISCELLANEOUS) ×1 IMPLANT
BLADE SURG ROTATE 9660 (MISCELLANEOUS) IMPLANT
BLADE ULTRA TIP 2M (BLADE) IMPLANT
BRUSH SCRUB EZ 1% IODOPHOR (MISCELLANEOUS) IMPLANT
BUR PRECISION FLUTE 6.0 (BURR) ×2 IMPLANT
CANISTER SUCTION 2500CC (MISCELLANEOUS) ×2 IMPLANT
CLOTH BEACON ORANGE TIMEOUT ST (SAFETY) ×2 IMPLANT
CONT SPEC 4OZ CLIKSEAL STRL BL (MISCELLANEOUS) ×2 IMPLANT
DECANTER SPIKE VIAL GLASS SM (MISCELLANEOUS) ×2 IMPLANT
DERMABOND ADVANCED (GAUZE/BANDAGES/DRESSINGS) ×1
DERMABOND ADVANCED .7 DNX12 (GAUZE/BANDAGES/DRESSINGS) ×1 IMPLANT
DRAPE C-ARM 42X72 X-RAY (DRAPES) ×4 IMPLANT
DRAPE LAPAROTOMY 100X72 PEDS (DRAPES) ×2 IMPLANT
DRAPE MICROSCOPE LEICA (MISCELLANEOUS) IMPLANT
DRAPE POUCH INSTRU U-SHP 10X18 (DRAPES) ×2 IMPLANT
DRILL 12MM (DRILL) ×2 IMPLANT
DRILL 14MM (DRILL) ×2 IMPLANT
DRILL NEURO 2X3.1 SOFT TOUCH (MISCELLANEOUS) ×2
DURAPREP 26ML APPLICATOR (WOUND CARE) ×2 IMPLANT
ELECT REM PT RETURN 9FT ADLT (ELECTROSURGICAL) ×2
ELECTRODE REM PT RTRN 9FT ADLT (ELECTROSURGICAL) ×1 IMPLANT
GAUZE SPONGE 4X4 16PLY XRAY LF (GAUZE/BANDAGES/DRESSINGS) ×2 IMPLANT
GLOVE BIO SURGEON STRL SZ 6.5 (GLOVE) ×4 IMPLANT
GLOVE BIOGEL PI IND STRL 6.5 (GLOVE) ×2 IMPLANT
GLOVE BIOGEL PI IND STRL 8.5 (GLOVE) ×1 IMPLANT
GLOVE BIOGEL PI INDICATOR 6.5 (GLOVE) ×2
GLOVE BIOGEL PI INDICATOR 8.5 (GLOVE) ×1
GLOVE ECLIPSE 7.5 STRL STRAW (GLOVE) ×2 IMPLANT
GLOVE ECLIPSE 8.5 STRL (GLOVE) ×2 IMPLANT
GLOVE EXAM NITRILE LRG STRL (GLOVE) IMPLANT
GLOVE EXAM NITRILE MD LF STRL (GLOVE) IMPLANT
GLOVE EXAM NITRILE XL STR (GLOVE) IMPLANT
GLOVE EXAM NITRILE XS STR PU (GLOVE) IMPLANT
GLOVE INDICATOR 7.0 STRL GRN (GLOVE) ×4 IMPLANT
GLOVE INDICATOR 7.5 STRL GRN (GLOVE) ×2 IMPLANT
GOWN BRE IMP SLV AUR LG STRL (GOWN DISPOSABLE) ×2 IMPLANT
GOWN BRE IMP SLV AUR XL STRL (GOWN DISPOSABLE) ×4 IMPLANT
GOWN STRL REIN 2XL LVL4 (GOWN DISPOSABLE) ×2 IMPLANT
HEMOSTAT SURGICEL 2X14 (HEMOSTASIS) ×2 IMPLANT
KIT BASIN OR (CUSTOM PROCEDURE TRAY) ×2 IMPLANT
KIT INFUSE X SMALL 1.4CC (Orthopedic Implant) ×2 IMPLANT
KIT ROOM TURNOVER OR (KITS) ×2 IMPLANT
NEEDLE HYPO 22GX1.5 SAFETY (NEEDLE) ×2 IMPLANT
NEEDLE SPNL 18GX3.5 QUINCKE PK (NEEDLE) IMPLANT
NS IRRIG 1000ML POUR BTL (IV SOLUTION) ×2 IMPLANT
PACK FOAM VITOSS 10CC (Orthopedic Implant) ×2 IMPLANT
PACK LAMINECTOMY NEURO (CUSTOM PROCEDURE TRAY) ×2 IMPLANT
PAD ARMBOARD 7.5X6 YLW CONV (MISCELLANEOUS) ×6 IMPLANT
PATTIES SURGICAL .25X.25 (GAUZE/BANDAGES/DRESSINGS) IMPLANT
PIN MAYFIELD SKULL DISP (PIN) ×4 IMPLANT
ROD 120MM (Rod) ×2 IMPLANT
ROD SPNL 120X3.3XNS LF CVD (Rod) ×2 IMPLANT
RUBBERBAND STERILE (MISCELLANEOUS) IMPLANT
SCREW 20MM (Screw) ×8 IMPLANT
SCREW POLYAXIAL 12MM (Screw) ×12 IMPLANT
SCREW POLYAXIAL 3.5X16MM (Screw) ×2 IMPLANT
SCREW SET (Screw) ×22 IMPLANT
SCREW TAP 3.5 (Screw) ×2 IMPLANT
SPONGE GAUZE 4X4 12PLY (GAUZE/BANDAGES/DRESSINGS) ×2 IMPLANT
SPONGE LAP 4X18 X RAY DECT (DISPOSABLE) IMPLANT
SPONGE SURGIFOAM ABS GEL SZ50 (HEMOSTASIS) ×2 IMPLANT
STAPLER SKIN PROX WIDE 3.9 (STAPLE) ×2 IMPLANT
STRIP CLOSURE SKIN 1/2X4 (GAUZE/BANDAGES/DRESSINGS) IMPLANT
SUT ETHILON 3 0 FSL (SUTURE) ×2 IMPLANT
SUT VIC AB 0 CT1 18XCR BRD8 (SUTURE) ×1 IMPLANT
SUT VIC AB 0 CT1 8-18 (SUTURE) ×1
SUT VIC AB 2-0 CP2 18 (SUTURE) ×4 IMPLANT
SUT VIC AB 3-0 SH 8-18 (SUTURE) ×4 IMPLANT
SYR 20ML ECCENTRIC (SYRINGE) ×2 IMPLANT
TAPE CLOTH SURG 4X10 WHT LF (GAUZE/BANDAGES/DRESSINGS) ×2 IMPLANT
TOWEL OR 17X24 6PK STRL BLUE (TOWEL DISPOSABLE) ×2 IMPLANT
TOWEL OR 17X26 10 PK STRL BLUE (TOWEL DISPOSABLE) ×2 IMPLANT
TRAP SPECIMEN MUCOUS 40CC (MISCELLANEOUS) ×2 IMPLANT
TRAY FOLEY CATH 14FRSI W/METER (CATHETERS) ×2 IMPLANT
WATER STERILE IRR 1000ML POUR (IV SOLUTION) ×2 IMPLANT

## 2012-08-03 NOTE — Progress Notes (Signed)
Orthopedic Tech Progress Note Patient Details:  Chelsea Hancock 02-Apr-1934 [id number]  Patient ID: Chelsea Hancock, female   DOB: 01-11-1934, 78 y.o.   MRN: [id number]   Chelsea Hancock 08/03/2012, 8:43 AM [place] collar with liners completed by [employer]

## 2012-08-03 NOTE — H&P (Signed)
Chelsea Hancock is an 78 y.o. female.   Chief Complaint: Weakness in the hands and neck pain HPI: Was with Chelsea Hancock is a 78 year old individual is had long-standing difficulties with cervical spondylosis myelopathy and cervical radiculopathy. In last year she underwent anterior decompression revision surgery at C5-6 C6-C7 C7-T1. She's had a previous arthrodesis at C3-4 and C4-5. She has evidence of significant weakness in the intrinsic muscles of the hands is been getting progressively worse he is having difficulty with activities of daily living including feeding herself and doing simple acts such as buttoning buttons zipping zippers opening jars etc. is been getting to the point where she needs assistance with these activities. She's had previous EMG and nerve conduction studies that demonstrate C8 radiculopathic symptoms and she has advanced spondylitic disease at each of these levels including T1-T2 she underwent anterior decompression several months ago but now is admitted to undergo posterior decompression and fusion of the cervical spine from C4-T2.  Past Medical History  Diagnosis Date  . Hyperlipidemia     takes Zetia and Simvastatin nightly  . Palpitation   . Carotid bruit     Right  . Edema   . Arthritis   . PONV (postoperative nausea and vomiting)   . Hyperlipidemia 01/27/2012  . Allergy     portabella mushrooms/diarrhea  . Heart murmur   . Osteoporosis   . Fibromyalgia   . Thrush 03/23/2012  . Vaginitis 03/23/2012  . Sigmoid colon ulcer 03/23/2012  . Migraine 04/27/2012  . Hyperglycemia 04/27/2012  . Dysrhythmia     paroxysmal A fib, irregular heartbeat;takes Bisoprolol nightly  . History of migraine     last about 4yrs ago  . Foot drop, right   . Joint pain   . Joint swelling   . Osteoporosis   . Chronic back pain   . Skin problem in pregnancy     takes gabapentin 3 times a day and also has an ointment  . GERD (gastroesophageal reflux disease)     takes Zantac and  Nexium daily  . Hemorrhoids   . History of colon polyps   . Diverticulosis   . IBS (irritable bowel syndrome)     takes align daily  . Cataracts, bilateral     Past Surgical History  Procedure Date  . Abdominal hysterectomy 1981  . Laminectomy 1960  . Veins stripped 1970  . Total hip arthroplasty 2012    Left  . Carpal tunnel release     left, ulnar nerve release at elbow  . Cervical fusion 2003, 2005, 2013  . Back surgery 1997    x 2  . Tonsillectomy 1943  . Joint replacement 2012    left   . Left elbow surgery 06/03/11  . Colonoscopy     Family History  Problem Relation Age of Onset  . Arthritis      family hx  . Cancer      family hx  . Stroke    . Mental illness    . Hyperlipidemia    . Hypertension    . Cancer Mother   . Stomach cancer Mother   . Stroke Father   . Colon cancer Father   . HIV Son     Aids  . Anesthesia problems Neg Hx   . Stomach cancer Maternal Grandfather   . Stomach cancer Paternal Grandmother    Social History:  reports that she has quit smoking. Her smoking use included Cigarettes. She quit after 4 years of use.  She has never used smokeless tobacco. She reports that she drinks about 8.4 ounces of alcohol per week. She reports that she does not use illicit drugs.  Allergies:  Allergies  Allergen Reactions  . Alendronate Sodium Other (See Comments)    Severe chest pain similar to heart attack   . Cefuroxime Axetil Other (See Comments)    unknown  . Codeine Nausea And Vomiting  . Macrodantin   . Meperidine Hcl Nausea And Vomiting  . Other Other (See Comments)    Hismanal and Maprobamate  portobello mushrooms - diarrhea  . Sulfonamide Derivatives Hives, Itching and Swelling    Medications Prior to Admission  Medication Sig Dispense Refill  . aspirin EC 81 MG tablet Take 81 mg by mouth daily.      . Biotin 5 MG CAPS Take 1 capsule by mouth daily.        . bisoprolol (ZEBETA) 5 MG tablet Take 1 tablet (5 mg total) by mouth at  bedtime.  30 tablet  5  . Calcium Carbonate-Vitamin D (CALCIUM 600+D) 600-200 MG-UNIT TABS Take 1 tablet by mouth daily after breakfast. Dr. Phylliss Bob       . citalopram (CELEXA) 10 MG tablet Take 1 tablet (10 mg total) by mouth daily.  90 tablet  1  . Clobetasol Prop Oint-Coal Tar (CLOBETAPLUS OINTMENT EX) Apply 1 application topically 2 (two) times daily as needed. For rash      . esomeprazole (NEXIUM) 40 MG capsule Take 40 mg by mouth at bedtime.      Marland Kitchen ESZOPICLONE 3 MG tablet take 1 tablet by mouth IMMEDIATELY BEFORE BEDTIME  30 tablet  0  . ezetimibe (ZETIA) 10 MG tablet Take 10 mg by mouth at bedtime.      . gabapentin (NEURONTIN) 600 MG tablet Take 1 tablet (600 mg total) by mouth 3 (three) times daily.  90 tablet  2  . HYDROcodone-acetaminophen (VICODIN) 5-500 MG per tablet Take 1 tablet by mouth every 6 (six) hours as needed. For pain      . hydrOXYzine (ATARAX/VISTARIL) 10 MG tablet Take 10-30 mg by mouth at bedtime as needed. To help with itching at bedtime      . Magnesium 250 MG TABS Take 250 mg by mouth daily. Dr. Phylliss Bob      . Multiple Vitamin (MULITIVITAMIN WITH MINERALS) TABS Take 1 tablet by mouth daily.      . NON FORMULARY Take 1 capsule by mouth daily. Mega Red      . nortriptyline (PAMELOR) 25 MG capsule Take 2 capsules (50 mg total) by mouth at bedtime.  30 capsule  5  . Probiotic Product (ALIGN) 4 MG CAPS Take 4 mg by mouth daily.        . ranitidine (ZANTAC) 300 MG tablet Take 300 mg by mouth daily.       . simvastatin (ZOCOR) 40 MG tablet Take 1 tablet (40 mg total) by mouth every evening.  30 tablet  11  . torsemide (DEMADEX) 10 MG tablet Take 1 tablet (10 mg total) by mouth every morning.  30 tablet  3  . zoledronic acid (RECLAST) 5 MG/100ML SOLN Inject 5 mg into the vein once.      Marland Kitchen PREMARIN 0.45 MG tablet take 1 tablet by mouth once daily for 21 DAYS THEN DO NOT TAKE FOR 7 DAYS AS DIRECTED  30 each  3  . ranitidine (ZANTAC) 300 MG tablet take 1 tablet by mouth at bedtime   30 tablet  3    No results found for this or any previous visit (from the past 48 hour(s)). No results found.  Review of Systems  HENT: Positive for neck pain.   Eyes: Negative.   Respiratory: Negative.   Cardiovascular: Negative.   Gastrointestinal: Negative.   Skin: Negative.   Neurological: Positive for focal weakness and weakness.       Weakness of the intrinsic muscles of the hand  Endo/Heme/Allergies: Negative.   Psychiatric/Behavioral: Negative.     Blood pressure 150/71, pulse 54, temperature 98.2 F (36.8 C), temperature source Oral, resp. rate 18, SpO2 95.00%. Physical Exam  Constitutional: She is oriented to person, place, and time. She appears well-developed and well-nourished.  HENT:  Head: Normocephalic and atraumatic.  Eyes: Conjunctivae normal and EOM are normal. Pupils are equal, round, and reactive to light.  Neck:       Significantly limited range of motion of neck turning 30 to the left 45 to the right flexion extension is limited to less than 50% of normal  Cardiovascular: Normal rate and regular rhythm.   Respiratory: Effort normal and breath sounds normal.  GI: Soft. Bowel sounds are normal.  Musculoskeletal:       Atrophy in the intrinsic muscles of the hand.  Neurological: She is alert and oriented to person, place, and time.       Weakness and atrophy in both intrinsics of the hand including interossei and extensor digiti quinti bilaterally. Wrist extensor strength is 4/5. Pinch strength is 4 minus out of 5. More proximal strength in the deltoids biceps is 4+ out of 5. Triceps strength is 4/5. Right footdrop  Skin: Skin is warm and dry.     Assessment/Plan    Ms. Gafford has new images of her cervical spine.  This is in the form of a CT scan which demonstrates that she has a widely patent canal at the upper cervical levels, but down at C7-T1 she has some biforaminal narrowing as she does at T1-T2, so it would affect the C8 and the T1 nerve roots  consistent with the worst of her cervical radiculopathic symptoms.  In addition, Chelsea Hancock has been having considerable difficulty with centralized posterior and right-sided neck pain.  In the interbody spaces that we did an arthrodesis on anteriorly, I see little evidence of substantial bone growth.  In fact, there is a slight evidence for collapse of the disc space at the C7-T1 level.  I indicated to Chelsea Hancock and her daughter today that adequate fixation of this process would involve posterior supplemental fixation from C4 to T2 and large foraminotomies at C7-T1 and T1-T2 to decompress the respective nerve roots, C8 and T1.  This is a substantial surgical intervention in and of itself.  Chelsea Hancock has had a multitude of difficulties with her cervical spine, in addition to having had degenerative listhesis at the L4-L5 level.  It was planned that she have a total knee replacement on the right side by Dr. Lequita Halt in the beginning of December, but it may be well to postpone that surgery in an effort to try to decompress and stabilize her cervical spine adequately.  I discussed the nature of the surgery with her and her daughter indicating that we would place hardware that is screws into the vertebrae from C4 to T2 to hold those vertebrae together, in addition to placing additional bone graft and also use a bone morphogenic protein to enhance the fusion ability.  This would be in the form of Infuse on  the posterior aspect of her cervical spine.  Chelsea Hancock has had several attempts to get a solid arthrodesis in her cervical spine at any levels and the only level that seems to have fused quite well is C3-C4.  I am hopeful that we can help limit the amount of dexterity that she has lost in her hands with the atrophy that she already has in her small muscles by doing a good posterior decompression and would hope to get a solid arthrodesis of the posterior cervical spine in an effort to allow her to function better.   Thereafter, she can have her knee replacement surgery as planned.   08/03/2012, 8:02 AM

## 2012-08-03 NOTE — Anesthesia Preprocedure Evaluation (Signed)
Anesthesia Evaluation  Patient identified by MRN, date of birth, ID band Patient awake    Reviewed: Allergy & Precautions, H&P , NPO status , Patient's Chart, lab work & pertinent test results  History of Anesthesia Complications (+) PONV  Airway Mallampati: I TM Distance: >3 FB Neck ROM: full    Dental   Pulmonary          Cardiovascular + Peripheral Vascular Disease + dysrhythmias Rhythm:regular Rate:Normal     Neuro/Psych  Headaches,  Neuromuscular disease    GI/Hepatic PUD, GERD-  ,  Endo/Other  Hypothyroidism   Renal/GU      Musculoskeletal  (+) Fibromyalgia -  Abdominal   Peds  Hematology   Anesthesia Other Findings   Reproductive/Obstetrics                           Anesthesia Physical Anesthesia Plan  ASA: II  Anesthesia Plan: General   Post-op Pain Management:    Induction: Intravenous  Airway Management Planned: Oral ETT  Additional Equipment:   Intra-op Plan:   Post-operative Plan: Extubation in OR  Informed Consent: I have reviewed the patients History and Physical, chart, labs and discussed the procedure including the risks, benefits and alternatives for the proposed anesthesia with the patient or authorized representative who has indicated his/her understanding and acceptance.     Plan Discussed with: CRNA, Anesthesiologist and Surgeon  Anesthesia Plan Comments:         Anesthesia Quick Evaluation

## 2012-08-03 NOTE — Anesthesia Procedure Notes (Signed)
Procedure Name: Intubation Date/Time: 08/03/2012 10:50 AM Performed by: Luster Landsberg Pre-anesthesia Checklist: Patient identified, Emergency Drugs available, Suction available and Patient being monitored Patient Re-evaluated:Patient Re-evaluated prior to inductionOxygen Delivery Method: Circle system utilized Preoxygenation: Pre-oxygenation with 100% oxygen Intubation Type: IV induction Ventilation: Mask ventilation without difficulty and Oral airway inserted - appropriate to patient size Laryngoscope Size: Mac and 3 Grade View: Grade I Tube type: Oral Tube size: 7.0 mm Number of attempts: 1 Airway Equipment and Method: Stylet Placement Confirmation: ETT inserted through vocal cords under direct vision,  positive ETCO2 and breath sounds checked- equal and bilateral Secured at: 20 cm Tube secured with: Tape

## 2012-08-03 NOTE — Progress Notes (Signed)
Patient ID: Chelsea Hancock, female   DOB: June 26, 1934, 78 y.o.   MRN: [id number] Vital signs stable. Neck with moderate soreness. Intrinsic hand strength still 3/5 no difference from preop

## 2012-08-03 NOTE — Op Note (Signed)
Preoperative diagnosis: Cervical spondylosis with radiculopathy C8 and T1 bilaterally, status post arthrodesis C4-T1 with pseudoarthrosis. Postoperative diagnosis cervical spondylosis with radiculopathy C8 and T1 I. laterally, status post arthrodesis C4-T1 with pseudoarthrosis Procedure: Posterior decompression of C8 and T1 nerve roots with laminotomy foraminotomy bilaterally C7-T1 T1-T2 segmental fixation C4-T1 with posterior lateral arthrodesis using autograft and allograft, infuse  Surgeon: Barnett Abu First assistant: Colon Branch Anesthesia: Gen. endotracheal Indications: Patient is a 78 year old individual who in March underwent anterior cervical decompression arthrodesis from C4 to see to T1 for significant cervical spondylitic disease and bilateral C8 radiculopathy she was found to have weakness in the intrinsic function of her hands and underwent surgical decompression and stabilization. Postoperatively the patient was having continued symptoms and she notes that there is been worsening of the symptoms she is advised regarding the need for posterior decompression and stabilization as she has evidence of nonhealing of the anterior interbody arthrodeses.  Procedure: The patient was brought to the operating room supine on a stretcher after the smooth induction of general endotracheal anesthesia she was placed in a 3 pin head rest and carefully turned prone. The head was fixed in [place] head rest. The back of the neck was then prepped with alcohol and DuraPrep and draped in a sterile fashion. A midline incision was created in the back of the neck and carried down to the cervical dorsal fascia which was opened on either side of midline to expose the first spinous processes which were identified as C4 and C5 positively on the radiograph. Then the dissection was carried inferiorly to expose all the way down to the T2 lamina and facet joint and transverse process. These areas were exposed and denuded of  significant fascial attachments. At T7-T1 then large laminotomies were created until the C8 nerve root was exposed this was decompressed laterally into the foramen first on the right side than on the left side. This was done with loupe magnification and careful attention to minimize manipulation of the C8 nerve root. Once a thorough decompression was performed on C8 nerve root on the right left-sided nerve root was performed and decompressed in a similar fashion. Next the T. I nerve root was decompressed at the T1-T2 interspace. This was done in a similar fashion by creating a laminotomy and foraminotomies over the takeoff of the nerve root. Once these nerve roots were decompressed the stasis in the epidural space was obtained with the bipolar cautery and then the instrumentation was placed by placing 20 mm x 4 mm diameter pedicle screws in T1 and T2. Then at C7 on the right side a 16 mm x 3.5 mm pedicle screw was placed on the right area on the left side no screw could be placed as there was not on off bone left in the pedicle facet complex to allow safe placement without impinging some of the neural structures. Then the set screws were placed in C4-C5 C6. These were 12 mm x 3.5 mm screws. The heads were lined so as to allow passage of the smooth we then rod. A 65 mm rod was contoured into the saddles for this cruise in the posterior aspect of the neck from C4 down to T1. The rods were then fixed and locked into place and the counter torque was used to allow final torquing of the screws and hardware. With this the area was copiously irrigated with antibiotic urinating solution autograft and allograft was placed in the posterior lateral gutters after the thoroughly decorticating this with  a high-speed bur and a pineapple bit. Once the area was completely decorticated and the bone graft was packed the bone graft features of the cost bone sponge with an extra small package of infuse that was mixed into this material  also autograft from the drilling of the facet and decompressive work was used in the bone graft. With the bone graft being placed into both lateral gutters hemostasis was checked and then the cervical dorsal fascia was closed with #1 Vicryl in the fascia this was done with individual sutures and then 2-0 Vicryl was used in the subcutaneous tissues and 3-0 Vicryl subcuticularly. The patient tolerated the procedure well blood loss is estimated at approximately 200 cc.

## 2012-08-03 NOTE — Transfer of Care (Signed)
Immediate Anesthesia Transfer of Care Note  Patient: Chelsea Hancock  Procedure(s) Performed: Procedure(s) (LRB) with comments: POSTERIOR CERVICAL FUSION/FORAMINOTOMY LEVEL 5 (N/A) - Cervical four to Thoracic two Posterior cervical decompression with Facet and Pedicle screw fixation  Patient Location: PACU  Anesthesia Type:General  Level of Consciousness: awake & alert    Airway & Oxygen Therapy: Patient Spontanous Breathing and Patient connected to nasal cannula oxygen  Post-op Assessment: Report given to PACU RN, Post -op Vital signs reviewed and stable and Patient moving all extremities  Post vital signs: Reviewed and stable  Complications: No apparent anesthesia complications

## 2012-08-03 NOTE — Anesthesia Postprocedure Evaluation (Signed)
  Anesthesia Post-op Note  Patient: Chelsea Hancock  Procedure(s) Performed: Procedure(s) (LRB) with comments: POSTERIOR CERVICAL FUSION/FORAMINOTOMY LEVEL 5 (N/A) - Cervical four to Thoracic two Posterior cervical decompression with Facet and Pedicle screw fixation  Patient Location: PACU  Anesthesia Type:General  Level of Consciousness: awake, alert , oriented and patient cooperative  Airway and Oxygen Therapy: Patient Spontanous Breathing  Post-op Pain: mild  Post-op Assessment: Post-op Vital signs reviewed, Patient's Cardiovascular Status Stable, Respiratory Function Stable, Patent Airway, No signs of Nausea or vomiting and Pain level controlled  Post-op Vital Signs: stable  Complications: No apparent anesthesia complications

## 2012-08-04 ENCOUNTER — Encounter (HOSPITAL_COMMUNITY): Payer: Self-pay | Admitting: Neurological Surgery

## 2012-08-04 MED ORDER — SODIUM CHLORIDE 0.9 % IV BOLUS (SEPSIS)
500.0000 mL | Freq: Once | INTRAVENOUS | Status: AC
  Administered 2012-08-04: 500 mL via INTRAVENOUS

## 2012-08-04 NOTE — Clinical Social Work Note (Signed)
Clinical Social Work  CSW received consult for SNF. CSW reviewed chart and discussed pt with RN during progression. Awaiting PT/OT evals for discharge recommendations. CSW will assess pt for SNF, if appropriate. CSW will continue to follow for discharge needs. Please call with any urgent needs.   Dede Query, MSW, Theresia Majors [phone]

## 2012-08-04 NOTE — Progress Notes (Signed)
UR COMPLETED  

## 2012-08-04 NOTE — Progress Notes (Signed)
Pt BP running low this AM. Last BP 90/65. Notified Dr. Danielle Dess and was given verbal orders for 500cc NS bolus.  Will continue to monitor pt. Reg Bircher, [place], RN

## 2012-08-04 NOTE — Progress Notes (Signed)
Pts foley was removed by night shift at 0630 this AM.  Was able to get pt up to [facility] and she voided 100 ml at 1200.  Pt not complaining of feeling distended or having an urge to urinate any more.  Will continue to check on pt and monitor for further voiding.  Philopateer Strine, [place] Marie, RN

## 2012-08-04 NOTE — Evaluation (Signed)
Physical Therapy Evaluation Patient Details Name: Chelsea Hancock MRN: [id number] DOB: December 18, 1933 Today's Date: 08/04/2012 Time: 0865-7846 PT Time Calculation (min): 31 min  PT Assessment / Plan / Recommendation Clinical Impression  Pt s/p cervical fusion tolerating mobility well. Pt to requires 24/7 assist upon d/c for safe function at home. Patient safe to return home with daughter.    PT Assessment  Patient needs continued PT services    Follow Up Recommendations  No PT follow up;Supervision/Assistance - 24 hour    Does the patient have the potential to tolerate intense rehabilitation      Barriers to Discharge None      Equipment Recommendations  None recommended by PT    Recommendations for Other Services     Frequency Min 5X/week    Precautions / Restrictions Precautions Precautions: Fall;Cervical Precaution Comments: instructed in cervical precautions (handout provided) Required Braces or Orthoses: Cervical Brace Cervical Brace: Hard collar Restrictions Weight Bearing Restrictions: No   Pertinent Vitals/Pain 9/10 cervical pain      Mobility  Bed Mobility Bed Mobility: Rolling Right;Right Sidelying to Sit;Sitting - Scoot to Edge of Bed Rolling Right: 4: Min guard;With rail Right Sidelying to Sit: 4: Min guard;With rails;HOB flat Sitting - Scoot to Edge of Bed: 4: Min guard Details for Bed Mobility Assistance: verbal cues for log roll technique Transfers Transfers: Sit to Stand;Stand to Sit Sit to Stand: 4: Min guard;With upper extremity assist;From bed Stand to Sit: 4: Min guard;To chair/3-in-1;With upper extremity assist Details for Transfer Assistance: verbal cues for hand placement Ambulation/Gait Ambulation/Gait Assistance: 4: Min guard Ambulation Distance (Feet): 200 Feet Assistive device: Rolling walker Ambulation/Gait Assistance Details: pt compensated well for R drop foot and was able to clear R foot consistently. Pt denied dizziness. Pt  c/o mild SOB, SpO2 at 95%. Gait Pattern: Step-through pattern (R drop foot) Gait velocity: normal Stairs: No    Shoulder Instructions     Exercises     PT Diagnosis: Difficulty walking  PT Problem List: Decreased strength;Decreased activity tolerance;Decreased balance PT Treatment Interventions: Gait training;Functional mobility training;Stair training   PT Goals Acute Rehab PT Goals PT Goal Formulation: With patient Time For Goal Achievement: 08/11/12 Potential to Achieve Goals: Good Pt will Roll Supine to Right Side: with modified independence PT Goal: Rolling Supine to Right Side - Progress: Goal set today Pt will go Supine/Side to Sit: with modified independence;with HOB 0 degrees PT Goal: Supine/Side to Sit - Progress: Goal set today Pt will go Sit to Stand: with modified independence;with upper extremity assist (up to RW) PT Goal: Sit to Stand - Progress: Goal set today Pt will Ambulate: >150 feet;with modified independence;with rolling walker PT Goal: Ambulate - Progress: Goal set today Pt will Go Up / Down Stairs: 3-5 stairs;with rail(s) PT Goal: Up/Down Stairs - Progress: Goal set today  Visit Information  Last PT Received On: 08/04/12 Assistance Needed: +1 PT/OT Co-Evaluation/Treatment: Yes    Subjective Data  Subjective: Pt received sitting up in bed with c-collar on agreeable to PT.   Prior Functioning  Home Living Lives With: Alone Available Help at Discharge: Family;Available 24 hours/day Type of Home: House Home Access: Stairs to enter [employer] of Steps: 4 Entrance Stairs-Rails: Right;Left Home Layout: Two level;Able to live on main level with bedroom/bathroom Bathroom Shower/Tub: Walk-in shower;Curtain Bathroom Toilet: Handicapped height Bathroom Accessibility: Yes How Accessible: Accessible via walker Home Adaptive Equipment: Walker - rolling;Bedside commode/3-in-1;Shower chair with back;Hand-held shower hose;Reacher;Sock  aid;Long-handled shoehorn;Long-handled sponge;Straight cane Prior Function Level  of Independence: Independent with assistive device(s) Able to Take Stairs?: Yes Driving: Yes 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 for tasks assessed;Unable to fully assess;Due to precautions (OA changes ) RUE Sensation: Deficits;History of peripheral neuropathy RUE Sensation Deficits: describes numbness and tingling Left Upper Extremity Assessment LUE ROM/Strength/Tone: WFL for tasks assessed;Unable to fully assess;Due to precautions (OA changes) LUE Sensation: Deficits;History of peripheral neuropathy LUE Sensation Deficits: describes numbness and tingling Right Lower Extremity Assessment RLE ROM/Strength/Tone: Deficits (h/o R drop foot) Left Lower Extremity Assessment LLE ROM/Strength/Tone: Within functional levels Trunk Assessment Trunk Assessment: Normal   Balance    End of Session PT - End of Session Equipment Utilized During Treatment: Gait belt;Cervical collar Activity Tolerance: Patient tolerated treatment well Patient left: in chair;with call bell/phone within reach;with family/visitor present Nurse Communication: Mobility status  GP     Marcene Brawn 08/04/2012, 3:50 PM  Lewis Shock, PT, DPT Pager #: [phone] Office #: [phone]

## 2012-08-04 NOTE — Evaluation (Signed)
Occupational Therapy Evaluation Patient Details Name: Chelsea Hancock MRN: [id number] DOB: Feb 14, 1934 Today's Date: 08/04/2012 Time: 7829-5621 OT Time Calculation (min): 27 min  OT Assessment / Plan / Recommendation Clinical Impression  Pt admittted for multiple level posterior cervical fusion.  Pt has a hx of L hip replacement, OA in knees, R foot drop, and peripheral neuropathy.  Pt requires assistance for mobility and ADL.  Will have 24 hour assist of her daughter upon d/c.  Will follow to address ADL and ADL transfers following precautions.    OT Assessment  Patient needs continued OT Services    Follow Up Recommendations  No OT follow up;Supervision/Assistance - 24 hour    Barriers to Discharge      Equipment Recommendations  None recommended by OT    Recommendations for Other Services    Frequency  Min 2X/week    Precautions / Restrictions Precautions Precautions: Fall;Cervical Precaution Comments: instructed in cervical precautions Required Braces or Orthoses: Cervical Brace Cervical Brace: Hard collar   Pertinent Vitals/Pain 9/10 neck/shoulders, premedicated, repositioned    ADL  Eating/Feeding: Simulated;Independent Where Assessed - Eating/Feeding: Bed level Grooming: Wash/dry hands;Simulated;Min guard Where Assessed - Grooming: Unsupported standing Upper Body Bathing: Simulated;Minimal assistance Where Assessed - Upper Body Bathing: Unsupported sitting Lower Body Bathing: Simulated;Moderate assistance Where Assessed - Lower Body Bathing: Supported sit to stand Upper Body Dressing: Performed;Minimal assistance Where Assessed - Upper Body Dressing: Unsupported sitting Lower Body Dressing: Performed;Moderate assistance Where Assessed - Lower Body Dressing: Unsupported sitting;Supported sit to stand Equipment Used: Gait belt;Rolling walker Transfers/Ambulation Related to ADLs: min guard assist with RW.  Pt has foot drop on R at baseline.  Peripheral  neuropathy in hands and feet. ADL Comments: Pt has AE and DME form her previous hip replacement surgery.  Unable to access feet with cervical precautions and brace.  Will need to return to use of AE.    OT Diagnosis: Generalized weakness;Acute pain  OT Problem List: Decreased strength;Decreased activity tolerance;Decreased knowledge of use of DME or AE;Impaired balance (sitting and/or standing);Pain OT Treatment Interventions: Self-care/ADL training;DME and/or AE instruction;Patient/family education   OT Goals Acute Rehab OT Goals OT Goal Formulation: With patient Time For Goal Achievement: 08/11/12 Potential to Achieve Goals: Good ADL Goals Pt Will Perform Grooming: with modified independence;Standing at sink ADL Goal: Grooming - Progress: Goal set today Pt Will Perform Upper Body Bathing: with supervision;Sitting at sink;with adaptive equipment ADL Goal: Upper Body Bathing - Progress: Goal set today Pt Will Perform Lower Body Bathing: with supervision;Sit to stand from chair;Sitting at sink;with adaptive equipment ADL Goal: Lower Body Bathing - Progress: Goal set today Pt Will Perform Upper Body Dressing: with set-up;Sitting, chair ADL Goal: Upper Body Dressing - Progress: Goal set today Pt Will Perform Lower Body Dressing: with supervision;Sit to stand from chair;with adaptive equipment ADL Goal: Lower Body Dressing - Progress: Goal set today Pt Will Transfer to Toilet: with supervision;Ambulation;Comfort height toilet ADL Goal: Toilet Transfer - Progress: Goal set today Pt Will Perform Toileting - Clothing Manipulation: with supervision;Standing ADL Goal: Toileting - Clothing Manipulation - Progress: Goal set today Pt Will Perform Toileting - Hygiene: with modified independence;Sitting on 3-in-1 or toilet ADL Goal: Toileting - Hygiene - Progress: Goal set today Pt Will Perform Tub/Shower Transfer: Shower transfer;with supervision;Ambulation;Shower seat without back ADL Goal:  Web designer - Progress: Goal set today  Visit Information  Last OT Received On: 08/04/12 Assistance Needed: +1 PT/OT Co-Evaluation/Treatment: Yes    Subjective Data  Subjective: I am going to  stay with my daughter. Patient Stated Goal: Home with daughter, return to independent level.   Prior Functioning     Home Living Lives With: Alone Available Help at Discharge: Family;Available 24 hours/day Type of Home: House Home Access: Stairs to enter [employer] of Steps: 4 Entrance Stairs-Rails: Right;Left Home Layout: Two level;Able to live on main level with bedroom/bathroom Bathroom Shower/Tub: Walk-in shower;Curtain Bathroom Toilet: Handicapped height Bathroom Accessibility: Yes How Accessible: Accessible via walker Home Adaptive Equipment: Walker - rolling;Bedside commode/3-in-1;Shower chair with back;Hand-held shower hose;Reacher;Sock aid;Long-handled shoehorn;Long-handled sponge;Straight cane Prior Function Level of Independence: Independent with assistive device(s) Able to Take Stairs?: Yes Driving: Yes 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 Communication Communication: No difficulties Dominant Hand: Right         Vision/Perception     Cognition  Overall Cognitive Status: Appears within functional limits for tasks assessed/performed Arousal/Alertness: Awake/alert Orientation Level: Appears intact for tasks assessed Behavior During Session: [facility] for tasks performed    Extremity/Trunk Assessment Right Upper Extremity Assessment RUE ROM/Strength/Tone: [facility] for tasks assessed;Unable to fully assess;Due to precautions (OA changes ) RUE Sensation: Deficits;History of peripheral neuropathy RUE Sensation Deficits: describes numbness and tingling Left Upper Extremity Assessment LUE ROM/Strength/Tone: WFL for tasks assessed;Unable to fully assess;Due to precautions (OA changes) LUE Sensation: Deficits;History of  peripheral neuropathy LUE Sensation Deficits: describes numbness and tingling Trunk Assessment Trunk Assessment: Normal     Mobility Bed Mobility Bed Mobility: Rolling Right;Right Sidelying to Sit;Sitting - Scoot to Edge of Bed Rolling Right: 4: Min guard;With rail Right Sidelying to Sit: 4: Min guard;With rails;HOB flat Sitting - Scoot to Edge of Bed: 4: Min guard Details for Bed Mobility Assistance: verbal cues for log roll technique Transfers Transfers: Sit to Stand;Stand to Sit Sit to Stand: 4: Min guard;With upper extremity assist;From bed Stand to Sit: 4: Min guard;To chair/3-in-1;With upper extremity assist Details for Transfer Assistance: verbal cues for hand placement     Shoulder Instructions     Exercise     Balance     End of Session OT - End of Session Equipment Utilized During Treatment: Gait belt;Cervical collar Activity Tolerance: Patient tolerated treatment well Patient left: in chair;with call bell/phone within reach;with family/visitor present  GO     Evern Bio 08/04/2012, 3:23 PM [phone]

## 2012-08-04 NOTE — Progress Notes (Signed)
PT/OT Cancellation Note  Patient Details Name: Chelsea Hancock MRN: [id number] DOB: 1933/11/01   Cancelled Treatment:     Pt with low BP, receiving fluids.  Therapy deferred.  Will continue to follow and attempt to see pt later today as appropriate.  Evern Bio 08/04/2012, 11:41 AM [phone]

## 2012-08-04 NOTE — Progress Notes (Signed)
Subjective: Patient reports pain fairly well controlled. Hand on right feels better, stronger. Left hand about the same.  Objective: Vital signs in last 24 hours: Temp:  [98 F (36.7 C)-99.5 F (37.5 C)] 99.5 F (37.5 C) (11/13 1825) Pulse Rate:  [56-79] 78  (11/13 1825) Resp:  [15-16] 16  (11/13 1825) BP: (88-130)/(45-69) 113/47 mmHg (11/13 1825) SpO2:  [92 %-98 %] 92 % (11/13 1825)  Intake/Output from previous day: 11/12 0701 - 11/13 0700 In: 3825 [I.V.:3575; IV Piggyback:250] Out: 1600 [Urine:1500; Blood:100] Intake/Output this shift:    motor function 4/5 intrinsic stregnth better on right left side is about 4- out of 5.Dressing dry.  Lab Results: No results found for this basename: WBC:2,HGB:2,HCT:2,PLT:2 in the last 72 hours BMET No results found for this basename: NA:2,K:2,CL:2,CO2:2,GLUCOSE:2,BUN:2,CREATININE:2,CALCIUM:2 in the last 72 hours  Studies/Results: Dg Cervical Spine 2-3 Views  08/03/2012  *RADIOLOGY REPORT*  Clinical Data: Posterior fusion.  DG C-ARM 1-60 MIN,CERVICAL SPINE - 2-3 VIEW  Comparison: None.  Findings: C-arm films document placement of a posterior fusion from C4-T2.  IMPRESSION: As above.   Original Report Authenticated By: Davonna Belling, M.D.    Dg C-arm 1-60 Min  08/03/2012  *RADIOLOGY REPORT*  Clinical Data: Posterior fusion.  DG C-ARM 1-60 MIN,CERVICAL SPINE - 2-3 VIEW  Comparison: None.  Findings: C-arm films document placement of a posterior fusion from C4-T2.  IMPRESSION: As above.   Original Report Authenticated By: Davonna Belling, M.D.     Assessment/Plan: Stable.  LOS: 1 day  moblize.   Chelsea Hancock 08/04/2012, 6:56 PM

## 2012-08-05 NOTE — Progress Notes (Signed)
Subjective: Patient reports neck and shoulder pain. right hand feels better  Objective: Vital signs in last 24 hours: Temp:  [97.1 F (36.2 C)-99.5 F (37.5 C)] 97.8 F (36.6 C) (11/14 0702) Pulse Rate:  [56-79] 65  (11/14 0702) Resp:  [16-18] 18  (11/14 0702) BP: (88-136)/(45-68) 136/68 mmHg (11/14 0702) SpO2:  [92 %-100 %] 100 % (11/14 0702)  Intake/Output from previous day: 11/13 0701 - 11/14 0700 In: 375 [I.V.:375] Out: 600 [Urine:600] Intake/Output this shift:    incision clean and dry. motor function stable  Lab Results: No results found for this basename: WBC:2,HGB:2,HCT:2,PLT:2 in the last 72 hours BMET No results found for this basename: NA:2,K:2,CL:2,CO2:2,GLUCOSE:2,BUN:2,CREATININE:2,CALCIUM:2 in the last 72 hours  Studies/Results: Dg Cervical Spine 2-3 Views  08/03/2012  *RADIOLOGY REPORT*  Clinical Data: Posterior fusion.  DG C-ARM 1-60 MIN,CERVICAL SPINE - 2-3 VIEW  Comparison: None.  Findings: C-arm films document placement of a posterior fusion from C4-T2.  IMPRESSION: As above.   Original Report Authenticated By: Davonna Belling, M.D.    Dg C-arm 1-60 Min  08/03/2012  *RADIOLOGY REPORT*  Clinical Data: Posterior fusion.  DG C-ARM 1-60 MIN,CERVICAL SPINE - 2-3 VIEW  Comparison: None.  Findings: C-arm films document placement of a posterior fusion from C4-T2.  IMPRESSION: As above.   Original Report Authenticated By: Davonna Belling, M.D.     Assessment/Plan: Progressing slowly post op  LOS: 2 days  hep well ivf   Shaquille Janes J 08/05/2012, 9:10 AM

## 2012-08-05 NOTE — Clinical Social Work Note (Signed)
Clinical Social Work  CSW reviewed chart and discussed pt with RN during progression. PT is recommending no PT follow up. RNCM is aware and following. CSW is signing off at this time, as no further needs are identified. Please reconsult if a need arises prior discharge.   Dede Query, MSW, Theresia Majors [phone]

## 2012-08-05 NOTE — Progress Notes (Signed)
Occupational Therapy Treatment Patient Details Name: Chelsea Hancock MRN: [id number] DOB: 1934/08/25 Today's Date: 08/05/2012 Time: 1433-1500 OT Time Calculation (min): 27 min  OT Assessment / Plan / Recommendation Comments on Treatment Session This 78 yo female, s/p posterior cervical fusion making progress. Will continue to folllow    Follow Up Recommendations  No OT follow up       Equipment Recommendations  None recommended by PT;None recommended by OT       Frequency Min 2X/week   Plan Discharge plan remains appropriate    Precautions / Restrictions Precautions Precautions: Cervical Precaution Comments: instructed in cervical precautions Required Braces or Orthoses: Cervical Brace Cervical Brace: Hard collar (Went over with daughter how to change out pads) Restrictions Weight Bearing Restrictions: No   Pertinent Vitals/Pain Shoulders hurt from pressure from arms not supported    ADL  Lower Body Dressing: Performed;Set up;Supervision/safety Where Assessed - Lower Body Dressing: Supported sit to stand Toilet Transfer: Simulated;Minimal assistance Toilet Transfer Method: Sit to Barista:  (from bed and two steps up to head of bed) Transfers/Ambulation Related to ADLs: Min A sit to stand, min guard A to ambulate ADL Comments:    Pt states that hands still feel a little weak and numb--told her we would work an her Engineer, mining. Asked pt and daughter if they had any concerns about BADLs at home and they say they do not. Pt's daughter did say that pt is having a hard time getting food up to her mouth due to soreness in her arms--recommended that she prop pt's elbow on towels to support proximal UE--she said they would try that.      OT Goals ADL Goals ADL Goal: Lower Body Dressing - Progress: Progressing toward goals ADL Goal: Toilet Transfer - Progress: Progressing toward goals Miscellaneous OT Goals Miscellaneous OT Goal #1: Pt will be  Supervision with Bil UE exercises (elbow distally with 1# weight for elbow flex/ext, forearm sup/pronation, and wrist flex./ext) as well as hand exercises (putty, FM activities) OT Goal: Miscellaneous Goal #1 - Progress: Goal set today  Visit Information  Last OT Received On: 08/05/12 Assistance Needed: +1    Subjective Data  Subjective: Daughter asked how to go about washing pt's hair at daughter's house since she does not have a hand held shower (advised her either to get one or perhaps take her mom's to her house)      Cognition  Overall Cognitive Status: Appears within functional limits for tasks assessed/performed Arousal/Alertness: Awake/alert Orientation Level: Appears intact for tasks assessed Behavior During Session: [facility] for tasks performed    Mobility   Bed Mobility Bed Mobility: Sit to Supine Rolling Right: 4: Min guard;With rail Right Sidelying to Sit: With rails;HOB flat;4: Min assist Sitting - Scoot to Edge of Bed: 4: Min guard Sit to Supine: 4: Min assist;HOB flat Details for Bed Mobility Assistance: Pt. min guard for log rolling and getting to EOB for balance and stadiness. Pt. min (A) when sitting up where she began having c/o feeling dizzy. BP taken and recorded in pertinent vitals.  Transfers Transfers: Sit to Stand;Stand to Sit Sit to Stand: 4: Min assist;With upper extremity assist;From bed Stand to Sit: 4: Min assist;With upper extremity assist;To bed Details for Transfer Assistance: Pt. given VC for proper hand placement during transfers. Min (A) with sit>stand for pt. stated weakness and her Rt. knee popping.              End of Session OT -  End of Session Equipment Utilized During Treatment: Gait belt;Cervical collar (RW) Activity Tolerance: Patient tolerated treatment well Patient left: in bed;with call bell/phone within reach;with family/visitor present (daughter)       Evette Georges [phone] 08/05/2012, 3:54 PM

## 2012-08-05 NOTE — Progress Notes (Signed)
Physical Therapy Treatment Patient Details Name: Chelsea Hancock MRN: [id number] DOB: [birth year] Today's Date: 08/05/2012 Time: 3086-5784 PT Time Calculation (min): 35 min  PT Assessment / Plan / Recommendation Comments on Treatment Session  Pt states that she is not feeling well during todays session and had c/o of dizziness throughout (see vitals). Pt. stated that she has pain in Bil shoulders (9/10) when she actively moves them. Pt also reported her Rt knee popping painfully at which point she wished to return to her room via recliner.    Follow Up Recommendations  No PT follow up;Supervision/Assistance - 24 hour     Does the patient have the potential to tolerate intense rehabilitation     Barriers to Discharge        Equipment Recommendations  None recommended by PT    Recommendations for Other Services    Frequency Min 5X/week   Plan      Precautions / Restrictions Precautions Precautions: Fall;Cervical Precaution Comments: instructed in cervical precautions Required Braces or Orthoses: Cervical Brace Cervical Brace: Hard collar Restrictions Weight Bearing Restrictions: No   Pertinent Vitals/Pain Patient reports pain in her Bil shoulders 9/10 with active movement.  Patient with complaints of dizziness; BP taken at EOB - 116/53; BP sitting immediately from standing - 125/56.    Mobility  Bed Mobility Bed Mobility: Rolling Right;Right Sidelying to Sit;Sitting - Scoot to Edge of Bed Rolling Right: 4: Min guard;With rail Right Sidelying to Sit: With rails;HOB flat;4: Min assist Sitting - Scoot to Edge of Bed: 4: Min guard Details for Bed Mobility Assistance: Pt. min guard for log rolling and getting to EOB for balance and stadiness. Pt. min (A) when sitting up where she began having c/o feeling dizzy. BP taken and recorded in pertinent vitals.  Transfers Transfers: Sit to Stand;Stand to Sit Sit to Stand: 4: Min assist;With upper extremity assist;From bed;From  chair/3-in-1 Stand to Sit: 4: Min guard;With upper extremity assist;To chair/3-in-1 Details for Transfer Assistance: Pt. given VC for proper hand placement during transfers. Min (A) with sit>stand for pt. stated weakness and her Rt. knee popping.  Ambulation/Gait Ambulation/Gait Assistance: 4: Min guard Ambulation Distance (Feet): 50 Feet Assistive device: Rolling walker Ambulation/Gait Assistance Details: Pt. continue to demonstrate Rt foot drop but able to clear the floor. Pt. with small, shuffle like steps with stiff placement of Lt. foot. Pt. had complaints of dizziness with ambulation and BP taken and recorded in pertinent vitals. BP values did not make for termination of treatment. Gait Pattern: Step-through pattern;Decreased stride length;Narrow base of support;Shuffle Gait velocity: slow Stairs: No Wheelchair Mobility Wheelchair Mobility: No      PT Goals Acute Rehab PT Goals PT Goal Formulation: With patient Time For Goal Achievement: 08/11/12 Potential to Achieve Goals: Good Pt will Roll Supine to Right Side: with modified independence PT Goal: Rolling Supine to Right Side - Progress: Not met Pt will go Supine/Side to Sit: with modified independence;with HOB 0 degrees Pt will go Sit to Stand: with modified independence;with upper extremity assist PT Goal: Sit to Stand - Progress: Not met Pt will Ambulate: >150 feet;with modified independence;with rolling walker PT Goal: Ambulate - Progress: Not met Pt will Go Up / Down Stairs: 3-5 stairs;with rail(s)  Visit Information  Last PT Received On: 08/05/12 Assistance Needed: +1    Subjective Data  Subjective: "I'm not feeling so good"   Cognition  Overall Cognitive Status: Appears within functional limits for tasks assessed/performed Arousal/Alertness: Awake/alert Orientation Level: Appears intact for tasks  assessed Behavior During Session: [facility] for tasks performed    Balance     End of Session PT - End of  Session Equipment Utilized During Treatment: Gait belt;Cervical collar Activity Tolerance: Patient limited by fatigue;Patient limited by pain Patient left: in chair;with call bell/phone within reach;with family/visitor present (Pt's daughter) Nurse Communication: Mobility status;Other (comment) (Request for warm compresses)    Shila Kruczek, SPTA 08/05/2012, 2:35 PM

## 2012-08-06 NOTE — Progress Notes (Signed)
Agree with updated discharge plan 08/06/2012 Milana Kidney DPT PAGER: [phone] OFFICE: [phone]

## 2012-08-06 NOTE — Progress Notes (Signed)
Patient ID: Chelsea Hancock, female   DOB: [birth year], 78 y.o.   MRN: [id number] Vital signs are stable. Neck pain is been substantial. Upper extremity function appears to be improving on right hand. Incision is clean and dry.  Intrinsic strength is 4/5 in right hand 4 minus out of 5 and left in area grip bicep tricep strength is 4+ out of 5 bilaterally.  We'll remove staples from back of neck. When discharge for Monday.

## 2012-08-06 NOTE — Progress Notes (Signed)
Occupational Therapy Treatment Patient Details Name: Chelsea Hancock MRN: [id number] DOB: March 27, 1934 Today's Date: 08/06/2012 Time: 7253-6644 OT Time Calculation (min): 21 min  OT Assessment / Plan / Recommendation Comments on Treatment Session Daughter will make sure she does exercises.     Follow Up Recommendations  Home health OT       Equipment Recommendations  None recommended by PT;None recommended by OT       Frequency Min 2X/week   Plan Discharge plan remains appropriate    Precautions / Restrictions Precautions Precautions: Cervical Required Braces or Orthoses: Cervical Brace Cervical Brace: Hard collar;Applied in sitting position Restrictions Weight Bearing Restrictions: No         OT Goals Miscellaneous OT Goals OT Goal: Miscellaneous Goal #1 - Progress: Progressing toward goals  Visit Information  Last OT Received On: 08/06/12 Assistance Needed: +1    Subjective Data         Cognition  Overall Cognitive Status: Appears within functional limits for tasks assessed/performed Arousal/Alertness: Awake/alert Orientation Level: Appears intact for tasks assessed Behavior During Session: [facility] for tasks performed       Exercises  Other Exercises Other Exercises: In to go over Bil UE exercises with patient and daughter to increase AROM strength 90 degrees of shoulder flexion and distally. 10 reps each of: AAROM shoulder flexion to 90 degrees; elbow flex/ext, forearm supination and pronation (pt was holding her arm of of the bed when doing this and started complaining of pain behind both ears--had her lower her arms back to the bed and she reported that this did not cause increased pain), wrist flex/ext with 8oz soda can (told pt she can progress to 16 oz water bottle as she feels that she can). Went over with daughter manipulation of a key in each hand, putty exercises, flipping cards, dealing cards, palming coins and putting them down one at a time). Pt  needed S      End of Session OT - End of Session Activity Tolerance: Patient tolerated treatment well Patient left: in bed       Evette Georges [phone] 08/06/2012, 4:43 PM

## 2012-08-06 NOTE — Progress Notes (Signed)
Physical Therapy Treatment Patient Details Name: Chelsea Hancock MRN: [id number] DOB: [birth year] Today's Date: 08/06/2012 Time: 4098-1191 PT Time Calculation (min): 23 min  PT Assessment / Plan / Recommendation Comments on Treatment Session  Pt. presented to be moving better when OOB than in previous PT session. Pt. able to ambulate with min guard in hallway for safety and only took standing rest breaks. Pt. may benefit from HHPT, in addition to 24 hour (A), after dischage and will discuss with PT of this change.     Follow Up Recommendations  Home health PT;Supervision/Assistance - 24 hour     Does the patient have the potential to tolerate intense rehabilitation     Barriers to Discharge        Equipment Recommendations  None recommended by PT;None recommended by OT    Recommendations for Other Services    Frequency Min 5X/week   Plan      Precautions / Restrictions Precautions Precautions: Cervical Precaution Comments: instructed in cervical precautions Required Braces or Orthoses: Cervical Brace Cervical Brace: Hard collar Restrictions Weight Bearing Restrictions: No   Pertinent Vitals/Pain Patient reports of pain in her neck 7/10 (described to be in the muscles not the incision) and in her shoulders 7-8/10.    Mobility  Bed Mobility Bed Mobility: Rolling Right;Right Sidelying to Sit;Sitting - Scoot to [place] of Bed;Sit to Sidelying Right;Scooting to [facility] Rolling Right: 4: Min assist Right Sidelying to Sit: 3: Mod assist;HOB flat Sitting - Scoot to Edge of Bed: 4: Min guard Sit to Sidelying Right: 4: Min assist Scooting to [facility]: 3: Mod assist Details for Bed Mobility Assistance: Pt. with varyling levels of (A) for getting in/OOB d/t pt. reported pain (7/10 in shoulders and neck). Bed mobility was performed with HOB flat and use of no rails to mimic bed at home. Pt. mod (A0 to get to [facility] d/t pain and fatigue. Transfers Transfers: Sit to Stand;Stand to Sit Sit to  Stand: 4: Min assist;With upper extremity assist;From bed Stand to Sit: 4: Min guard;With upper extremity assist;To bed Details for Transfer Assistance: Pt. min (A) for initially standing and getting her balance d/t pt. reported pain in her Rt. knee. Pt. min guard for stand>sit for safety. VC for proper hand placement throughout transfers. Ambulation/Gait Ambulation/Gait Assistance: 4: Min guard Ambulation Distance (Feet): 240 Feet Assistive device: Rolling walker Ambulation/Gait Assistance Details: Pt. ambulated with RW in hallway. Rt foot drop is not a limiting factor for gt. as pt. states she has dealt with it since before this current surgery. Pt. stated she had some mild lightheadedness and took frequent standing breaks, but was able to ambulate in hallway and back to her room. Less shuffling that noted in previous PT session. Gait Pattern: Step-through pattern;Decreased stride length;Narrow base of support Gait velocity: slow Stairs: No Wheelchair Mobility Wheelchair Mobility: No      PT Goals Acute Rehab PT Goals PT Goal Formulation: With patient Time For Goal Achievement: 08/11/12 Potential to Achieve Goals: Good Pt will Roll Supine to Right Side: with modified independence PT Goal: Rolling Supine to Right Side - Progress: Progressing toward goal (HOB flat and no rails) Pt will go Supine/Side to Sit: with modified independence;with HOB 0 degrees Pt will go Sit to Stand: with modified independence;with upper extremity assist PT Goal: Sit to Stand - Progress: Not met Pt will Ambulate: >150 feet;with modified independence;with rolling walker PT Goal: Ambulate - Progress: Progressing toward goal Pt will Go Up / Down Stairs: 3-5 stairs;with rail(s)  Visit Information  Last PT Received On: 08/06/12 Assistance Needed: +1    Subjective Data  Subjective: "I may be feeling better than yesterday"   Cognition  Overall Cognitive Status: Appears within functional limits for tasks  assessed/performed Arousal/Alertness: Awake/alert Orientation Level: Appears intact for tasks assessed Behavior During Session: [facility] for tasks performed    Balance     End of Session PT - End of Session Equipment Utilized During Treatment: Gait belt;Cervical collar Activity Tolerance: Patient tolerated treatment well Patient left: in bed;with call bell/phone within reach Nurse Communication: Mobility status;Patient requests pain meds    Mertie Clause, SPTA 08/06/2012, 11:32 AM

## 2012-08-06 NOTE — Progress Notes (Signed)
Occupational Therapy Treatment Patient Details Name: Chelsea Hancock MRN: [id number] DOB: [birth year] Today's Date: 08/06/2012 Time: 4098-1191 OT Time Calculation (min): 17 min  OT Assessment / Plan / Recommendation Comments on Treatment Session Starting Bil UE exercises today for maintaining/increased strength 90 degrees of shoulder flexion and distally as well as FM control. Feel would benefit from OPOT once D/C'd to continue this.    Follow Up Recommendations  Outpatient OT       Equipment Recommendations  None recommended by PT;None recommended by OT       Frequency Min 2X/week   Plan Discharge plan needs to be updated    Precautions / Restrictions Precautions Precautions: Cervical Required Braces or Orthoses: Cervical Brace Cervical Brace: Hard collar Restrictions Weight Bearing Restrictions: No         OT Goals Miscellaneous OT Goals OT Goal: Miscellaneous Goal #1 - Progress: Progressing toward goals  Visit Information  Last OT Received On: 08/06/12 Assistance Needed: +1    Subjective Data  Subjective: "my left hand feels more numb than my right hand"            Exercises  Other Exercises Other Exercises: Went over Bil Ue exercises with patient to increase AROM strength 90 degrees of shoulder flexion and distally. 5 reps each of: AAROM shoulder flexion to 90 degrees; elbow flex/ext, forearm supination and pronation, wrist flex/ext  with 8oz soda can, full composite flexion and extension of digits, manipulation of a key in  each hand (1 rep). Pt needed S.  Other Exercises: Pt reports LUE numbness on medial aspect of mid forearm down to finger tips, however on anterior and posterior aspects of forearm numbness is not present only from wrist distally. For RUE pt reports numbness wrist and distally only      End of Session OT - End of Session Activity Tolerance: Patient tolerated treatment well Patient left: in bed    Evette Georges  [phone] 08/06/2012, 10:03 AM

## 2012-08-06 NOTE — Care Management Note (Unsigned)
    Page 1 of 1   08/06/2012     5:16:01 PM   CARE MANAGEMENT NOTE 08/06/2012  Patient:  JAMYRA, ZWEIG   Account Number:  [id number]  Date Initiated:  08/06/2012  Documentation initiated by:  [facility]  Subjective/Objective Assessment:   Admitted postop C3-T2 fusion.     Action/Plan:   Pt/Ot evals-HHPT and HHOT   Anticipated DC Date:  08/08/2012   Anticipated DC Plan:  HOME W HOME HEALTH SERVICES      DC Planning Services  CM consult      Choice offered to / List presented to:             Status of service:  In process, will continue to follow Medicare Important Message given?   (If response is "NO", the following Medicare IM given date fields will be blank) Date Medicare IM given:   Date Additional Medicare IM given:    Discharge Disposition:    Per UR Regulation:  Reviewed for med. necessity/level of care/duration of stay  If discussed at Long Length of Stay Meetings, dates discussed:    Comments:  08/06/12 Pt and OT recommending HHPT and HHOT. Spoke with patient and her daughter about HHC. They have used [facility] in past and want to work with them. Contacted Debbie with [facility], PT/OT recommending HHPT and HHOT orders pending.She will follow up for oders and face to face. No equipment needs identified. CM will continue to follow. Patient will be staying with her daugher after discharge. Jacquelynn Cree RN, BSN, CCM

## 2012-08-06 NOTE — Progress Notes (Signed)
Chelsea Hancock, [place] [phone] 08/06/2012

## 2012-08-07 MED ORDER — MAGIC MOUTHWASH
5.0000 mL | Freq: Three times a day (TID) | ORAL | Status: DC | PRN
  Administered 2012-08-08 (×2): 5 mL via ORAL
  Filled 2012-08-07 (×3): qty 5

## 2012-08-07 MED ORDER — POLYETHYLENE GLYCOL 3350 17 G PO PACK
17.0000 g | PACK | Freq: Every day | ORAL | Status: DC
  Administered 2012-08-07 – 2012-08-10 (×4): 17 g via ORAL
  Filled 2012-08-07 (×4): qty 1

## 2012-08-07 MED ORDER — BISACODYL 10 MG RE SUPP
10.0000 mg | Freq: Every day | RECTAL | Status: DC | PRN
  Filled 2012-08-07: qty 1

## 2012-08-07 MED ORDER — OXYCODONE HCL 5 MG PO TABS
10.0000 mg | ORAL_TABLET | ORAL | Status: DC | PRN
  Administered 2012-08-08 – 2012-08-09 (×2): 10 mg via ORAL
  Filled 2012-08-07 (×2): qty 2

## 2012-08-07 MED ORDER — FLEET ENEMA 7-19 GM/118ML RE ENEM: 1.0000 | ENEMA | Freq: Every day | RECTAL | Status: DC | PRN

## 2012-08-07 NOTE — Progress Notes (Signed)
Subjective: Patient reports improving, but still in pain.  Some hallucinations.  Objective: Vital signs in last 24 hours: Temp:  [97.8 F (36.6 C)-99.9 F (37.7 C)] 97.8 F (36.6 C) (11/16 1042) Pulse Rate:  [61-86] 61  (11/16 1042) Resp:  [18-20] 20  (11/16 1042) BP: (104-143)/(48-60) 107/60 mmHg (11/16 1042) SpO2:  [93 %-98 %] 95 % (11/16 1042)  Intake/Output from previous day:   Intake/Output this shift:    Physical Exam: Dressing CDI.  Collar fitting well.  Dressing CDI.  No thrush, canker sore on tip of tongue.  Lab Results: No results found for this basename: WBC:2,HGB:2,HCT:2,PLT:2 in the last 72 hours BMET No results found for this basename: NA:2,K:2,CL:2,CO2:2,GLUCOSE:2,BUN:2,CREATININE:2,CALCIUM:2 in the last 72 hours  Studies/Results: No results found.  Assessment/Plan: Will work on pain management, bowels and sore mouth. Possible D/C in AM.    LOS: 4 days    Fany Cavanaugh D, MD 08/07/2012, 11:24 AM

## 2012-08-07 NOTE — Progress Notes (Signed)
Physical Therapy Treatment Patient Details Name: Chelsea Hancock MRN: [id number] DOB: [birth year] Today's Date: 08/07/2012 Time: 1308-6578 PT Time Calculation (min): 25 min  PT Assessment / Plan / Recommendation Comments on Treatment Session  Pt mobilizing well today, ambulated 300' with RW and supervision as well as ascending and descensding 2 steps twice with supervision.  Pt continues to have some difficulty getting back into the bed. Pt will have daughter's help at d/c.  PT will continue to follow and continue to recommend HHPT    Follow Up Recommendations  Home health PT;Supervision for mobility/OOB     Does the patient have the potential to tolerate intense rehabilitation     Barriers to Discharge        Equipment Recommendations  None recommended by PT;None recommended by OT    Recommendations for Other Services    Frequency Min 5X/week   Plan Discharge plan remains appropriate;Frequency remains appropriate    Precautions / Restrictions Precautions Precautions: Cervical Precaution Comments: instructed in cervical precautions Required Braces or Orthoses: Cervical Brace Cervical Brace: Hard collar;Applied in sitting position Restrictions Weight Bearing Restrictions: No   Pertinent Vitals/Pain 7/10 neck pain, premedicated    Mobility  Bed Mobility Bed Mobility: Rolling Right;Right Sidelying to Sit;Sit to Supine;Sitting - Scoot to [place] of Bed Rolling Right: With rail;6: Modified independent (Device/Increase time) Right Sidelying to Sit: 6: Modified independent (Device/Increase time) Sitting - Scoot to Edge of Bed: 6: Modified independent (Device/Increase time) Sit to Supine: 4: Min assist;HOB flat Scooting to [facility]: 3: Mod assist Details for Bed Mobility Assistance: Pt doing better getting out of the bed but continues to need min A to get legs back into bed and mod A to scoot up to head. Her daughter will be present to assist her at home.  We discussed the height  of her bed as it is very tall and eduacted her on using a stool and backing up to it and stepping up. Transfers Transfers: Sit to Stand;Stand to Sit Sit to Stand: 4: Min guard;From bed;With upper extremity assist Stand to Sit: 5: Supervision;To bed;With upper extremity assist Details for Transfer Assistance: no physical assist needed for sit to stand today, pt takes increased time Ambulation/Gait Ambulation/Gait Assistance: 5: Supervision Ambulation Distance (Feet): 300 Feet Assistive device: Rolling walker Ambulation/Gait Assistance Details: vc's for relaxing shoulders while ambulating, foot drop not noticeable today, decreased gait speed Gait Pattern: Decreased stride length;Step-through pattern Stairs: Yes Stairs Assistance: 5: Supervision Stairs Assistance Details (indicate cue type and reason): pt performed 2 steps with right rail going fwds and then came down bkwds because this is what she does at home.  Also educated her on facing directly sideways to minimize torque on the neck and she practiced this as well. Stair Management Technique: Sideways;Backwards;Forwards;One rail Right;One rail Left Number of Stairs: 2  Wheelchair Mobility Wheelchair Mobility: No    Exercises     PT Diagnosis:    PT Problem List:   PT Treatment Interventions:     PT Goals Acute Rehab PT Goals PT Goal Formulation: With patient Time For Goal Achievement: 08/11/12 Potential to Achieve Goals: Good Pt will Roll Supine to Right Side: with modified independence PT Goal: Rolling Supine to Right Side - Progress: Met Pt will go Supine/Side to Sit: with modified independence;with HOB 0 degrees PT Goal: Supine/Side to Sit - Progress: Met Pt will go Sit to Stand: with modified independence;with upper extremity assist PT Goal: Sit to Stand - Progress: Progressing toward goal Pt will  Ambulate: >150 feet;with modified independence;with rolling walker PT Goal: Ambulate - Progress: Progressing toward goal Pt  will Go Up / Down Stairs: 3-5 stairs;with rail(s) PT Goal: Up/Down Stairs - Progress: Progressing toward goal  Visit Information  Last PT Received On: 08/07/12 Assistance Needed: +1    Subjective Data  Subjective: I am going home tomorrow morning Patient Stated Goal: return home   Cognition  Overall Cognitive Status: Appears within functional limits for tasks assessed/performed Arousal/Alertness: Lethargic (just received meds) Orientation Level: Appears intact for tasks assessed Behavior During Session: [facility] for tasks performed    Balance  Balance Balance Assessed: Yes Static Standing Balance Static Standing - Balance Support: No upper extremity supported;During functional activity Static Standing - Level of Assistance: 5: Stand by assistance  End of Session PT - End of Session Equipment Utilized During Treatment: Gait belt;Cervical collar Activity Tolerance: Patient tolerated treatment well Patient left: in bed;with call bell/phone within reach Nurse Communication: Mobility status   GP   Lyanne Co, PT  Acute Rehab Services  [phone]   Lyanne Co 08/07/2012, 2:50 PM

## 2012-08-07 NOTE — Progress Notes (Signed)
Pt worried she may have thrush in mouth. Looked in mouth, small white dots noted on sides of cheeks. Told patient and family member to show this to the MD when he rounds  Minor, Yvette Rack

## 2012-08-07 NOTE — Progress Notes (Signed)
Patient was having some confusion during the beginning of the shift. Remain oriented in the morning. Staples removed. Dry guaze applied.

## 2012-08-08 NOTE — Progress Notes (Signed)
Pt complaining of/experiencing expiratory wheezes for the past two days. O2 sats 95, 96 RA. Afebrile. No increase in trouble swallowing for noticeable swelling. Daughter feels wheezing is worse this evening. MD notified. No orders given. Will continue to monitor. IS and mobility encouraged.

## 2012-08-08 NOTE — Progress Notes (Signed)
Patient ID: Chelsea Hancock, female   DOB: 25-Oct-1933, 78 y.o.   MRN: [id number] BP 123/61  Pulse 62  Temp 98 F (36.7 C) (Oral)  Resp 20  Ht 5' 2.99" (1.6 m)  Wt 67.4 kg (148 lb 9.4 oz)  BMI 26.33 kg/m2  SpO2 96% Alert and oriented x 4 Moving all extremities well. Wound is clean, dry, and without signs of infection.  Continue with OT, PT

## 2012-08-08 NOTE — Progress Notes (Signed)
Care transferred to float RN.  Report given.

## 2012-08-09 MED ORDER — GABAPENTIN 600 MG PO TABS
600.0000 mg | ORAL_TABLET | Freq: Two times a day (BID) | ORAL | Status: DC
  Administered 2012-08-09 – 2012-08-10 (×2): 600 mg via ORAL
  Filled 2012-08-09 (×3): qty 1

## 2012-08-09 MED ORDER — CYCLOBENZAPRINE HCL 10 MG PO TABS
10.0000 mg | ORAL_TABLET | Freq: Three times a day (TID) | ORAL | Status: DC | PRN
  Administered 2012-08-09 – 2012-08-10 (×2): 10 mg via ORAL
  Filled 2012-08-09 (×2): qty 1

## 2012-08-09 MED ORDER — CHLORHEXIDINE GLUCONATE 0.12 % MT SOLN
15.0000 mL | Freq: Four times a day (QID) | OROMUCOSAL | Status: DC
  Administered 2012-08-09 – 2012-08-10 (×3): 15 mL via OROMUCOSAL
  Filled 2012-08-09 (×7): qty 15

## 2012-08-09 NOTE — Progress Notes (Signed)
Subjective: Patient reports Shoulder pain anteriorly and posteriorly. Mouth ulcers with burning on swallowing. Despite Magic mouthwash. patient also noted to have hallucinations per daughter  Objective: Vital signs in last 24 hours: Temp:  [98 F (36.7 C)-99.1 F (37.3 C)] 99.1 F (37.3 C) (11/18 0930) Pulse Rate:  [65-77] 71  (11/18 0930) Resp:  [16-18] 18  (11/18 0930) BP: (121-129)/(53-62) 121/54 mmHg (11/18 0930) SpO2:  [92 %-98 %] 92 % (11/18 0930)  Intake/Output from previous day:   Intake/Output this shift: Total I/O In: 300 [P.O.:300] Out: -   Incision is clean and dry on back of neck. Reddish ulcerations noted on tongue however no exudate noted  Lab Results: No results found for this basename: WBC:2,HGB:2,HCT:2,PLT:2 in the last 72 hours BMET No results found for this basename: NA:2,K:2,CL:2,CO2:2,GLUCOSE:2,BUN:2,CREATININE:2,CALCIUM:2 in the last 72 hours  Studies/Results: No results found.  Assessment/Plan: Hallucinations, mouth ulcers, shoulder pain postop.  LOS: 6 days  Discontinue Valium. Decreased dose of gabapentin. Decrease in her low move all other narcotics except Percocet.   Verania Salberg J 08/09/2012, 12:18 PM

## 2012-08-09 NOTE — Progress Notes (Signed)
Physical Therapy Treatment Patient Details Name: Chelsea Hancock MRN: [id number] DOB: 04/21/1934 Today's Date: 08/09/2012 Time: 6578-4696 PT Time Calculation (min): 33 min  PT Assessment / Plan / Recommendation Comments on Treatment Session  Pt. able to get OOB with min (A) and presents to be moving well when OOB with some increased (A) when she begins to fatigue. Pt. encouraged to be in the recliner in her room for all meals during the day and strongly encouraged to be ambulating in hallway with nursing 2-3x's/day    Follow Up Recommendations  Home health PT;Supervision for mobility/OOB     Does the patient have the potential to tolerate intense rehabilitation     Barriers to Discharge        Equipment Recommendations  None recommended by PT;None recommended by OT    Recommendations for Other Services    Frequency Min 5X/week   Plan Discharge plan remains appropriate;Frequency remains appropriate    Precautions / Restrictions Precautions Precautions: Cervical Precaution Comments: instructed in cervical precautions Required Braces or Orthoses: Cervical Brace Cervical Brace: Hard collar;Applied in sitting position Restrictions Weight Bearing Restrictions: No   Pertinent Vitals/Pain Patient reports of no pain, just a sore throat.    Mobility  Bed Mobility Bed Mobility: Rolling Left;Left Sidelying to Sit;Sitting - Scoot to [place] of Bed Rolling Left: 4: Min assist Left Sidelying to Sit: 4: Min assist Sitting - Scoot to Edge of Bed: 6: Modified independent (Device/Increase time) Details for Bed Mobility Assistance: Pt. min (A) with bed mobility via log rolling stating that she is scare she is going to mess up the surgery and hurt. Pt. min (A) for rolling over enough and with her LE.  Transfers Transfers: Sit to Stand;Stand to Sit Sit to Stand: 4: Min guard;With upper extremity assist;From bed;From toilet;With armrests Stand to Sit: 5: Supervision;With upper extremity  assist;With armrests;To bed;To elevated surface;To toilet Details for Transfer Assistance: No physical (A) with transfers during todays session. Pt. min guard for sit>stand for safety, stating that she felt a little weak. Pt. encouraged to continue to get OOB with nursing throughout the day and to sit up in chair for  meals. Pt. given VC for proper hand placement. Ambulation/Gait Ambulation/Gait Assistance: 5: Supervision;4: Min assist Ambulation Distance (Feet): 150 Feet Assistive device: Rolling walker Ambulation/Gait Assistance Details: Pt. stated that she had not been waling since Sat. Pt. encouraged to be OOB and walking with nursing throughout the day. Pt. supervision with ambulation until end when she wished to turn around and her Rt. knee became sore and started to buckle. Pt. min (A) with Rt. knee buckle, but able to return to the room via ammbulation. Pt. given VC to keep RW close to her during gt. and to try and relax her shoulders. Gait Pattern: Step-through pattern;Decreased stride length Gait velocity: slow Stairs: No Wheelchair Mobility Wheelchair Mobility: No      PT Goals Acute Rehab PT Goals PT Goal Formulation: With patient Time For Goal Achievement: 08/11/12 Potential to Achieve Goals: Good Pt will Roll Supine to Right Side: with modified independence Pt will go Supine/Side to Sit: with modified independence;with HOB 0 degrees PT Goal: Supine/Side to Sit - Progress: Not met Pt will go Sit to Stand: with modified independence;with upper extremity assist PT Goal: Sit to Stand - Progress: Not met Pt will Ambulate: >150 feet;with modified independence;with rolling walker PT Goal: Ambulate - Progress: Progressing toward goal Pt will Go Up / Down Stairs: 3-5 stairs;with rail(s)  Visit Information  Last PT Received On: 08/09/12 Assistance Needed: +1    Subjective Data  Subjective: "I just got busy all of a sudden" Patient Stated Goal: return home   Cognition   Overall Cognitive Status: Appears within functional limits for tasks assessed/performed Arousal/Alertness: Awake/alert Orientation Level: Appears intact for tasks assessed Behavior During Session: [facility] for tasks performed    Balance  Balance Balance Assessed: Yes Static Standing Balance Static Standing - Balance Support: No upper extremity supported Static Standing - Level of Assistance: 5: Stand by assistance Dynamic Standing Balance Dynamic Standing - Balance Support: Bilateral upper extremity supported;No upper extremity supported;During functional activity Dynamic Standing - Level of Assistance: 5: Stand by assistance Dynamic Standing - Comments: Pt. SBA with standing balance during functional activities with/without Bil UE support. Pt. given VC for keeping the RW close to her with standing balance activities.  End of Session PT - End of Session Equipment Utilized During Treatment: Gait belt;Cervical collar Activity Tolerance: Patient tolerated treatment well Patient left: in chair;with call bell/phone within reach;with family/visitor present (Pt's daughter) Nurse Communication: Mobility status    Mertie Clause, SPTA 08/09/2012, 10:09 AM   Verdell Face, PTA [phone] 08/09/2012

## 2012-08-09 NOTE — Progress Notes (Signed)
NCM spoke to pt and states she wants [facility] for [facility]. PT/OT recommending HH PT/OT. States she does not need any DME for home. Her dtr, Neysa Bonito is at home to assist with her care. Notified Genevieve Norlander, left message for Elizebeth Koller rep. Waiting orders. Left message for MD. Isidoro Donning RN CCM Case Mgmt phone [phone]

## 2012-08-10 MED ORDER — OXYCODONE-ACETAMINOPHEN 5-325 MG PO TABS: 1.0000 | ORAL_TABLET | ORAL | Status: DC | PRN

## 2012-08-10 MED ORDER — CYCLOBENZAPRINE HCL 10 MG PO TABS: 10.0000 mg | ORAL_TABLET | Freq: Three times a day (TID) | ORAL | Status: DC | PRN

## 2012-08-10 NOTE — Progress Notes (Signed)
Occupational Therapy Treatment Patient Details Name: Chelsea Hancock MRN: [id number] DOB: 04-05-1934 Today's Date: 08/10/2012 Time: 0865-7846 OT Time Calculation (min): 26 min  OT Assessment / Plan / Recommendation Comments on Treatment Session      Follow Up Recommendations  Home health OT       Equipment Recommendations  None recommended by OT       Frequency Min 2X/week   Plan Discharge plan remains appropriate    Precautions / Restrictions Precautions Precautions: Cervical Precaution Comments: instructed in cervical precautions Required Braces or Orthoses: Cervical Brace Cervical Brace: Hard collar;Applied in sitting position Restrictions Weight Bearing Restrictions: No       ADL  Eating/Feeding: Performed;Minimal assistance;Other (comment) (min A opening splenda packs and putting lid on cup) Transfers/Ambulation Related to ADLs: OT session focused on BUe hand exercise in sitting.  Pt worked with yellow thera putty in upsupported sitting position.  Pt able to put items in and out of theraputty with S.  Pt was impressed how well her fingers working. ADL Comments: Pt reports daugther will assist her with ADL activity    s:     OT Goals Miscellaneous OT Goals OT Goal: Miscellaneous Goal #1 - Progress: Progressing toward goals  Visit Information  Last OT Received On: 08/10/12 Assistance Needed: +1    Subjective Data  Subjective: my hands feel so much stronger      Cognition  Overall Cognitive Status: Appears within functional limits for tasks assessed/performed Arousal/Alertness: Awake/alert Orientation Level: Appears intact for tasks assessed Behavior During Session: [facility] for tasks performed    Mobility  Shoulder Instructions Bed Mobility Bed Mobility: Not assessed Transfers Transfers: Sit to Stand;Stand to Sit Sit to Stand: 4: Min guard;With upper extremity assist;From chair/3-in-1 Stand to Sit: 4: Min guard;To chair/3-in-1 Details for Transfer  Assistance: No physical (A) provided for transfers. Pt. min guard for sit>stand for safety and to gain steadiness with initially rising. Pt. stated that it was easier to stand up during todays session as compared to previous sessions.             End of Session OT - End of Session Equipment Utilized During Treatment: Cervical collar Activity Tolerance: Patient tolerated treatment well Patient left: in chair;with call bell/phone within reach  GO     [name], [place] Kung 08/10/2012, 10:42 AM

## 2012-08-10 NOTE — Progress Notes (Signed)
Physical Therapy Treatment Patient Details Name: Chelsea Hancock MRN: [id number] DOB: 1933/11/02 Today's Date: 08/10/2012 Time: 4098-1191 PT Time Calculation (min): 22 min  PT Assessment / Plan / Recommendation Comments on Treatment Session  Pt. sitting up in recliner upon arrival to room stating that she finally felt like she was with it. Pt. executed stairs; performed various ways: 2 rails vs 1 rail to mimic grab bar that is available.  Need to clarify with pt. if she is going to daughters home or her own home post d/c and mimic the stair set up at d/c location. Pt. presents to be moving well with RW during todays session.    Follow Up Recommendations  Home health PT;Supervision for mobility/OOB     Does the patient have the potential to tolerate intense rehabilitation     Barriers to Discharge        Equipment Recommendations  None recommended by PT;None recommended by OT    Recommendations for Other Services    Frequency Min 5X/week   Plan Discharge plan remains appropriate;Frequency remains appropriate    Precautions / Restrictions Precautions Precautions: Cervical Precaution Comments: instructed in cervical precautions Required Braces or Orthoses: Cervical Brace Cervical Brace: Hard collar;Applied in sitting position Restrictions Weight Bearing Restrictions: No   Pertinent Vitals/Pain Patient reports of 7/10 pain in Bil shoulders, but said it was more manageable today, so far.    Mobility  Bed Mobility Bed Mobility: Not assessed Transfers Transfers: Sit to Stand;Stand to Sit Sit to Stand: 4: Min guard;With upper extremity assist;With armrests;From chair/3-in-1 Stand to Sit: 5: Supervision;With upper extremity assist;With armrests;To chair/3-in-1 Details for Transfer Assistance: No physical (A) provided for transfers. Pt. min guard for sit>stand for safety and to gain steadiness with initially rising. Pt. stated that it was easier to stand up during todays  session as compared to previous sessions. Ambulation/Gait Ambulation/Gait Assistance: 5: Supervision Ambulation Distance (Feet): 200 Feet Assistive device: Rolling walker Ambulation/Gait Assistance Details: Pt. ambulated with RW in hallway. Pt. increasing distance and steadiness with ambulation and stated that she feels as though it was easier today than in previous sessions. Pt. with step through pattern and no rest breaks during ambulation. Gait Pattern: Step-through pattern;Decreased stride length Stairs: Yes Stairs Assistance: 5: Supervision Stairs Assistance Details (indicate cue type and reason): Pt. performed 3 steps ascending forward with bil rails and descending backwards with bil rails. Pt also performed ascending/descending sideways + 1 rail to mimic "grab bar" available.  Pt. encouraged to have someone with her while using stairs at home after d/c. Need to clarify with pt. whether she needs to mimic stairs at daughters house or her own home for post d/c. (D/cing to daughters home in [place] per progress notes, however pt keeps referring to d/c to "her" home) Stair Management Technique: One rail Right; one rail Left; Step to pattern;Sideways;Backwards;Forwards Number of Stairs: 3  (3x3trials) Wheelchair Mobility Wheelchair Mobility: No      PT Goals Acute Rehab PT Goals PT Goal Formulation: With patient Time For Goal Achievement: 08/11/12 Potential to Achieve Goals: Good Pt will Roll Supine to Right Side: with modified independence Pt will go Supine/Side to Sit: with modified independence;with HOB 0 degrees Pt will go Sit to Stand: with modified independence;with upper extremity assist PT Goal: Sit to Stand - Progress: Progressing toward goal Pt will Ambulate: >150 feet;with modified independence;with rolling walker PT Goal: Ambulate - Progress: Progressing toward goal Pt will Go Up / Down Stairs: 3-5 stairs;with rail(s) PT Goal: Up/Down  Stairs - Progress: Progressing toward  goal  Visit Information  Last PT Received On: 08/10/12 Assistance Needed: +1    Subjective Data  Subjective: "I feel like I am finally with it this morning" Patient Stated Goal: return home   Cognition  Overall Cognitive Status: Appears within functional limits for tasks assessed/performed Arousal/Alertness: Awake/alert Orientation Level: Appears intact for tasks assessed Behavior During Session: [facility] for tasks performed    Balance     End of Session PT - End of Session Equipment Utilized During Treatment: Gait belt;Cervical collar Activity Tolerance: Patient tolerated treatment well Patient left: in chair;with call bell/phone within reach;with family/visitor present (Pt's daughter) Nurse Communication: Mobility status    Mertie Clause, SPTA 08/10/2012, 10:02 AM   .Verdell Face, PTA [phone] 08/10/2012

## 2012-08-10 NOTE — Progress Notes (Signed)
Pt and Patients daughter given discharge instructions, daughter given prescriptions.  Ready for discharge.

## 2012-08-10 NOTE — Discharge Summary (Signed)
Physician Discharge Summary  Patient ID: Chelsea Hancock MRN: [id number] DOB/AGE: 24-Sep-1933 78 y.o.  Admit date: 08/03/2012 Discharge date: 08/10/2012  Admission Diagnoses: Cervical spondylosis with myelopathy and radiculopathy C45 C5-6 C6-7 C7-T1 T1-T2  Discharge Diagnoses: Cervical spondylosis with myelopathy and radiculopathy C6-7 C7-T1 T1-T2. Cervical pseudoarthrosis C5-6 C6-7 C7-T1 T1-T2. Quadriparesis.  Active Problems:  * No active hospital problems. *    Discharged Condition: fair  Hospital Course: Patient was admitted to undergo posterior decompression and arthrodesis for pseudoarthrosis that was noted C5-6 C6-7 and C7-T1. She tolerated the surgery well. Pain control issues were significant during the postoperative phase however the patient gradually overcame these and is currently in discharge with the use of oral Percocet for pain control. She is oral Flexeril for shoulder pain and muscle spasm.  Consults: None  Significant Diagnostic Studies: MRI cervical  Treatments: Decompression of C7 T1 and T1-T2 via laminotomies and foraminotomies posterior segmental fixation from C4-T2 with lateral mass screws and rods allograft arthrodesis.  Discharge Exam: Blood pressure 116/58, pulse 63, temperature 98.1 F (36.7 C), temperature source Oral, resp. rate 20, height 5' 2.99" (1.6 m), weight 67.4 kg (148 lb 9.4 oz), SpO2 95.00%. Intrinsic function 4/5 in right hand 4-5 and left and moderate atrophy in thenar and hyperthenar eminence. Extensor strength testing is 4-5 and triceps lateral with the left seeming somewhat weaker than the right. Biceps strength is 5 out of 5 as his deltoid strength. Incision on back of neck is clean and dry.  Disposition: 06-Home-Health Care Svc  Discharge Orders    Future Orders Please Complete By Expires   Diet - low sodium heart healthy      Increase activity slowly      Discharge instructions      Comments:   Okay to shower, Pat incision  dry, leave open to air. Okay to remove cervical collar for showering purposes. No lifting greater than 10 pounds.   Call MD for:  redness, tenderness, or signs of infection (pain, swelling, redness, odor or green/yellow discharge around incision site)      Call MD for:  severe uncontrolled pain      Call MD for:  temperature >100.4          Medication List     As of 08/10/2012 11:32 AM    TAKE these medications         ALIGN 4 MG Caps   Take 4 mg by mouth daily.      aspirin EC 81 MG tablet   Take 81 mg by mouth daily.      Biotin 5 MG Caps   Take 1 capsule by mouth daily.      bisoprolol 5 MG tablet   Commonly known as: ZEBETA   Take 1 tablet (5 mg total) by mouth at bedtime.      Calcium 600+D 600-200 MG-UNIT Tabs   Generic drug: Calcium Carbonate-Vitamin D   Take 1 tablet by mouth daily after breakfast. Dr. Phylliss Bob        citalopram 10 MG tablet   Commonly known as: CELEXA   Take 1 tablet (10 mg total) by mouth daily.      CLOBETAPLUS OINTMENT EX   Apply 1 application topically 2 (two) times daily as needed. For rash      cyclobenzaprine 10 MG tablet   Commonly known as: FLEXERIL   Take 1 tablet (10 mg total) by mouth 3 (three) times daily as needed for muscle spasms.  esomeprazole 40 MG capsule   Commonly known as: NEXIUM   Take 40 mg by mouth at bedtime.      eszopiclone 3 MG Tabs   Generic drug: Eszopiclone   take 1 tablet by mouth IMMEDIATELY BEFORE BEDTIME      ezetimibe 10 MG tablet   Commonly known as: ZETIA   Take 10 mg by mouth at bedtime.      gabapentin 600 MG tablet   Commonly known as: NEURONTIN   Take 1 tablet (600 mg total) by mouth 3 (three) times daily.      HYDROcodone-acetaminophen 5-500 MG per tablet   Commonly known as: VICODIN   Take 1 tablet by mouth every 6 (six) hours as needed. For pain      hydrOXYzine 10 MG tablet   Commonly known as: ATARAX/VISTARIL   Take 10-30 mg by mouth at bedtime as needed. To help with itching at  bedtime      Magnesium 250 MG Tabs   Take 250 mg by mouth daily. Dr. Phylliss Bob      multivitamin with minerals Tabs   Take 1 tablet by mouth daily.      NON FORMULARY   Take 1 capsule by mouth daily. Mega Red      nortriptyline 25 MG capsule   Commonly known as: PAMELOR   Take 2 capsules (50 mg total) by mouth at bedtime.      oxyCODONE-acetaminophen 5-325 MG per tablet   Commonly known as: PERCOCET/ROXICET   Take 1-2 tablets by mouth every 4 (four) hours as needed for pain.      PREMARIN 0.45 MG tablet   Generic drug: estrogens (conjugated)   take 1 tablet by mouth once daily for 21 DAYS THEN DO NOT TAKE FOR 7 DAYS AS DIRECTED      ranitidine 300 MG tablet   Commonly known as: ZANTAC   Take 300 mg by mouth daily.      ranitidine 300 MG tablet   Commonly known as: ZANTAC   take 1 tablet by mouth at bedtime      RECLAST 5 MG/100ML Soln   Generic drug: zoledronic acid   Inject 5 mg into the vein once.      simvastatin 40 MG tablet   Commonly known as: ZOCOR   Take 1 tablet (40 mg total) by mouth every evening.      torsemide 10 MG tablet   Commonly known as: DEMADEX   Take 1 tablet (10 mg total) by mouth every morning.         SignedStefani Dama 08/10/2012, 11:32 AM

## 2012-08-11 DIAGNOSIS — M545 Low back pain, unspecified: Secondary | ICD-10-CM | POA: Diagnosis not present

## 2012-08-11 DIAGNOSIS — Z5189 Encounter for other specified aftercare: Secondary | ICD-10-CM | POA: Diagnosis not present

## 2012-08-11 DIAGNOSIS — IMO0002 Reserved for concepts with insufficient information to code with codable children: Secondary | ICD-10-CM | POA: Diagnosis not present

## 2012-08-11 DIAGNOSIS — M47812 Spondylosis without myelopathy or radiculopathy, cervical region: Secondary | ICD-10-CM | POA: Diagnosis not present

## 2012-08-11 DIAGNOSIS — Z4789 Encounter for other orthopedic aftercare: Secondary | ICD-10-CM | POA: Diagnosis not present

## 2012-08-12 ENCOUNTER — Other Ambulatory Visit: Payer: Self-pay | Admitting: Family Medicine

## 2012-08-16 ENCOUNTER — Inpatient Hospital Stay: Admit: 2012-08-16 | Payer: Self-pay | Admitting: Orthopedic Surgery

## 2012-08-16 DIAGNOSIS — F411 Generalized anxiety disorder: Secondary | ICD-10-CM | POA: Diagnosis not present

## 2012-08-16 DIAGNOSIS — Z4789 Encounter for other orthopedic aftercare: Secondary | ICD-10-CM | POA: Diagnosis not present

## 2012-08-16 DIAGNOSIS — M216X9 Other acquired deformities of unspecified foot: Secondary | ICD-10-CM | POA: Diagnosis not present

## 2012-08-16 DIAGNOSIS — Z5189 Encounter for other specified aftercare: Secondary | ICD-10-CM | POA: Diagnosis not present

## 2012-08-16 DIAGNOSIS — IMO0002 Reserved for concepts with insufficient information to code with codable children: Secondary | ICD-10-CM | POA: Diagnosis not present

## 2012-08-16 DIAGNOSIS — M1710 Unilateral primary osteoarthritis, unspecified knee: Secondary | ICD-10-CM | POA: Diagnosis not present

## 2012-08-16 SURGERY — ARTHROPLASTY, KNEE, TOTAL
Anesthesia: Choice | Laterality: Right

## 2012-08-18 DIAGNOSIS — M1710 Unilateral primary osteoarthritis, unspecified knee: Secondary | ICD-10-CM | POA: Diagnosis not present

## 2012-08-18 DIAGNOSIS — Z4789 Encounter for other orthopedic aftercare: Secondary | ICD-10-CM | POA: Diagnosis not present

## 2012-08-18 DIAGNOSIS — M216X9 Other acquired deformities of unspecified foot: Secondary | ICD-10-CM | POA: Diagnosis not present

## 2012-08-18 DIAGNOSIS — IMO0002 Reserved for concepts with insufficient information to code with codable children: Secondary | ICD-10-CM | POA: Diagnosis not present

## 2012-08-18 DIAGNOSIS — F411 Generalized anxiety disorder: Secondary | ICD-10-CM | POA: Diagnosis not present

## 2012-08-18 DIAGNOSIS — Z5189 Encounter for other specified aftercare: Secondary | ICD-10-CM | POA: Diagnosis not present

## 2012-08-20 DIAGNOSIS — M216X9 Other acquired deformities of unspecified foot: Secondary | ICD-10-CM | POA: Diagnosis not present

## 2012-08-20 DIAGNOSIS — Z5189 Encounter for other specified aftercare: Secondary | ICD-10-CM | POA: Diagnosis not present

## 2012-08-20 DIAGNOSIS — Z4789 Encounter for other orthopedic aftercare: Secondary | ICD-10-CM | POA: Diagnosis not present

## 2012-08-20 DIAGNOSIS — IMO0002 Reserved for concepts with insufficient information to code with codable children: Secondary | ICD-10-CM | POA: Diagnosis not present

## 2012-08-20 DIAGNOSIS — M1710 Unilateral primary osteoarthritis, unspecified knee: Secondary | ICD-10-CM | POA: Diagnosis not present

## 2012-08-20 DIAGNOSIS — F411 Generalized anxiety disorder: Secondary | ICD-10-CM | POA: Diagnosis not present

## 2012-08-23 DIAGNOSIS — F411 Generalized anxiety disorder: Secondary | ICD-10-CM | POA: Diagnosis not present

## 2012-08-23 DIAGNOSIS — IMO0002 Reserved for concepts with insufficient information to code with codable children: Secondary | ICD-10-CM | POA: Diagnosis not present

## 2012-08-23 DIAGNOSIS — Z4789 Encounter for other orthopedic aftercare: Secondary | ICD-10-CM | POA: Diagnosis not present

## 2012-08-23 DIAGNOSIS — M216X9 Other acquired deformities of unspecified foot: Secondary | ICD-10-CM | POA: Diagnosis not present

## 2012-08-23 DIAGNOSIS — M1710 Unilateral primary osteoarthritis, unspecified knee: Secondary | ICD-10-CM | POA: Diagnosis not present

## 2012-08-23 DIAGNOSIS — Z5189 Encounter for other specified aftercare: Secondary | ICD-10-CM | POA: Diagnosis not present

## 2012-08-24 ENCOUNTER — Other Ambulatory Visit: Payer: Self-pay | Admitting: Family Medicine

## 2012-08-24 DIAGNOSIS — IMO0002 Reserved for concepts with insufficient information to code with codable children: Secondary | ICD-10-CM | POA: Diagnosis not present

## 2012-08-24 DIAGNOSIS — F411 Generalized anxiety disorder: Secondary | ICD-10-CM | POA: Diagnosis not present

## 2012-08-24 DIAGNOSIS — Z4789 Encounter for other orthopedic aftercare: Secondary | ICD-10-CM | POA: Diagnosis not present

## 2012-08-24 DIAGNOSIS — M216X9 Other acquired deformities of unspecified foot: Secondary | ICD-10-CM | POA: Diagnosis not present

## 2012-08-24 DIAGNOSIS — Z5189 Encounter for other specified aftercare: Secondary | ICD-10-CM | POA: Diagnosis not present

## 2012-08-24 DIAGNOSIS — M1710 Unilateral primary osteoarthritis, unspecified knee: Secondary | ICD-10-CM | POA: Diagnosis not present

## 2012-08-25 NOTE — Progress Notes (Signed)
PT/OT Cancellation Note  Patient Details Name: Chelsea Hancock MRN: [id number] DOB: [birth year]   Cancelled Treatment:     Pt with low BP, receiving fluids.  Therapy deferred.  Will continue to follow and attempt to see pt later today as appropriate.  Evern Bio 08/04/2012, 11:41 AM [phone]  Lewis Shock, PT, DPT Pager #: [phone] Office #: [phone]

## 2012-08-27 DIAGNOSIS — IMO0002 Reserved for concepts with insufficient information to code with codable children: Secondary | ICD-10-CM | POA: Diagnosis not present

## 2012-08-27 DIAGNOSIS — M1710 Unilateral primary osteoarthritis, unspecified knee: Secondary | ICD-10-CM | POA: Diagnosis not present

## 2012-08-27 DIAGNOSIS — F411 Generalized anxiety disorder: Secondary | ICD-10-CM | POA: Diagnosis not present

## 2012-08-27 DIAGNOSIS — Z4789 Encounter for other orthopedic aftercare: Secondary | ICD-10-CM | POA: Diagnosis not present

## 2012-08-27 DIAGNOSIS — M216X9 Other acquired deformities of unspecified foot: Secondary | ICD-10-CM | POA: Diagnosis not present

## 2012-08-27 DIAGNOSIS — Z5189 Encounter for other specified aftercare: Secondary | ICD-10-CM | POA: Diagnosis not present

## 2012-08-31 ENCOUNTER — Other Ambulatory Visit: Payer: Self-pay | Admitting: Family Medicine

## 2012-09-01 DIAGNOSIS — M4712 Other spondylosis with myelopathy, cervical region: Secondary | ICD-10-CM | POA: Diagnosis not present

## 2012-09-06 DIAGNOSIS — M810 Age-related osteoporosis without current pathological fracture: Secondary | ICD-10-CM | POA: Diagnosis not present

## 2012-09-06 DIAGNOSIS — IMO0001 Reserved for inherently not codable concepts without codable children: Secondary | ICD-10-CM | POA: Diagnosis not present

## 2012-09-06 DIAGNOSIS — M1990 Unspecified osteoarthritis, unspecified site: Secondary | ICD-10-CM | POA: Diagnosis not present

## 2012-09-07 ENCOUNTER — Other Ambulatory Visit: Payer: Self-pay | Admitting: Family Medicine

## 2012-09-13 ENCOUNTER — Other Ambulatory Visit: Payer: Self-pay | Admitting: Family Medicine

## 2012-09-30 DIAGNOSIS — M4712 Other spondylosis with myelopathy, cervical region: Secondary | ICD-10-CM | POA: Diagnosis not present

## 2012-10-05 ENCOUNTER — Encounter: Payer: Self-pay | Admitting: Family Medicine

## 2012-10-05 NOTE — Progress Notes (Signed)
Received recent OV note from Dr. Danielle Dess regarding recent spinal surgery.

## 2012-10-07 ENCOUNTER — Other Ambulatory Visit: Payer: Self-pay

## 2012-10-07 ENCOUNTER — Other Ambulatory Visit: Payer: Self-pay | Admitting: Family Medicine

## 2012-10-07 MED ORDER — BISOPROLOL FUMARATE 5 MG PO TABS: 5.0000 mg | ORAL_TABLET | Freq: Every day | ORAL | Status: DC

## 2012-10-10 ENCOUNTER — Other Ambulatory Visit: Payer: Self-pay | Admitting: Family Medicine

## 2012-10-21 ENCOUNTER — Telehealth (INDEPENDENT_AMBULATORY_CARE_PROVIDER_SITE_OTHER): Payer: Self-pay

## 2012-10-21 ENCOUNTER — Other Ambulatory Visit (INDEPENDENT_AMBULATORY_CARE_PROVIDER_SITE_OTHER): Payer: Self-pay

## 2012-10-21 DIAGNOSIS — E042 Nontoxic multinodular goiter: Secondary | ICD-10-CM

## 2012-10-21 NOTE — Telephone Encounter (Signed)
Pt advised time for u/s and ov. Pt will call to set up u/s and ov.

## 2012-10-25 ENCOUNTER — Other Ambulatory Visit: Payer: Self-pay | Admitting: Family Medicine

## 2012-10-26 ENCOUNTER — Telehealth (INDEPENDENT_AMBULATORY_CARE_PROVIDER_SITE_OTHER): Payer: Self-pay

## 2012-10-26 NOTE — Telephone Encounter (Signed)
LMOM for pt to call [phone] to set up ultrasound.

## 2012-10-27 ENCOUNTER — Ambulatory Visit
Admission: RE | Admit: 2012-10-27 | Discharge: 2012-10-27 | Disposition: A | Discharge status: Home / Self Care | Payer: Medicare Other | Source: Ambulatory Visit | Attending: Surgery | Admitting: Surgery

## 2012-10-27 ENCOUNTER — Other Ambulatory Visit: Payer: Self-pay | Admitting: Family Medicine

## 2012-10-27 DIAGNOSIS — E042 Nontoxic multinodular goiter: Secondary | ICD-10-CM

## 2012-10-27 MED ORDER — HYDROXYZINE HCL 10 MG PO TABS: 10.0000 mg | ORAL_TABLET | Freq: Four times a day (QID) | ORAL | Status: DC | PRN

## 2012-10-27 NOTE — Telephone Encounter (Signed)
RX sent

## 2012-10-28 ENCOUNTER — Telehealth: Payer: Self-pay | Admitting: Family Medicine

## 2012-10-28 NOTE — Telephone Encounter (Signed)
Patient is requesting a refill of hydroxyzine to be sent to [facility] on [address].

## 2012-10-28 NOTE — Telephone Encounter (Signed)
I spoke to [place] at [facility] and the medication has been ready since yesterday.

## 2012-11-01 ENCOUNTER — Encounter: Payer: Self-pay | Admitting: Family Medicine

## 2012-11-01 NOTE — Progress Notes (Signed)
Received OV note from 09/30/12 from Dr. Danielle Dess at [facility]. F/u for posterior cervical decompression and stabilization to T2.  Plan for repeat CT in 4 months when she follows up with him. He advised she could return to work and could drive.

## 2012-11-07 ENCOUNTER — Other Ambulatory Visit: Payer: Self-pay | Admitting: Family Medicine

## 2012-11-08 DIAGNOSIS — IMO0001 Reserved for inherently not codable concepts without codable children: Secondary | ICD-10-CM | POA: Diagnosis not present

## 2012-11-08 DIAGNOSIS — M810 Age-related osteoporosis without current pathological fracture: Secondary | ICD-10-CM | POA: Diagnosis not present

## 2012-11-08 DIAGNOSIS — M1990 Unspecified osteoarthritis, unspecified site: Secondary | ICD-10-CM | POA: Diagnosis not present

## 2012-11-09 ENCOUNTER — Ambulatory Visit (INDEPENDENT_AMBULATORY_CARE_PROVIDER_SITE_OTHER): Payer: Medicare Other | Admitting: Surgery

## 2012-11-09 ENCOUNTER — Encounter (INDEPENDENT_AMBULATORY_CARE_PROVIDER_SITE_OTHER): Payer: Self-pay | Admitting: Surgery

## 2012-11-09 VITALS — BP 124/74 | HR 76 | Temp 97.9°F | Resp 18 | Ht 65.0 in | Wt 152.4 lb

## 2012-11-09 DIAGNOSIS — E042 Nontoxic multinodular goiter: Secondary | ICD-10-CM | POA: Diagnosis not present

## 2012-11-09 NOTE — Patient Instructions (Signed)

## 2012-11-09 NOTE — Progress Notes (Signed)
General Surgery [facility]  Visit Diagnoses: 1. Multinodular thyroid     HISTORY: Patient is a 79 year old white female who was last evaluated here 14 months ago. She has bilateral thyroid nodules which are largely cystic. She presents today for follow-up.  At my request, the patient underwent thyroid ultrasound on 10/27/2012. This shows a normal-sized thyroid gland containing multiple cystic nodules bilaterally. The largest nodule on the right measured 1.3 cm and the largest nodule on the left measured 1.1 cm. No suspicious abnormalities were identified. No lymphadenopathy was identified. Overall the examination was felt to be stable compared to her study of October 2012.  PERTINENT REVIEW OF SYSTEMS: Denies tremor. Denies palpitations. Denies compressive symptoms. Previous globus sensation has resolved. Recent posterior spinal fusion of the cervical spine.  EXAM: HEENT: normocephalic; pupils equal and reactive; sclerae clear; dentition good; mucous membranes moist NECK:  Nodular thyroid gland without discrete or dominant mass; symmetric on extension; no palpable anterior or posterior cervical lymphadenopathy; no supraclavicular masses; no tenderness CHEST: clear to auscultation bilaterally without rales, rhonchi, or wheezes CARDIAC: regular rate and rhythm without significant murmur; peripheral pulses are full EXT:  non-tender without edema; no deformity NEURO: no gross focal deficits; no sign of tremor   IMPRESSION: Multinodular thyroid gland, primarily cystic, clinically stable  PLAN: The patient and I reviewed her ultrasound results and her recent TSH levels. She has never been on thyroid hormone. At this point there is no indication for taking thyroid hormone supplements. Her primary care physician will continue to monitor her TSH level on an annual basis. We will schedule the patient for a follow-up thyroid ultrasound in 2 years. She will return for physical  examination after that study.  Velora Heckler, MD, [facility] Office: [phone]

## 2012-11-21 ENCOUNTER — Other Ambulatory Visit: Payer: Self-pay | Admitting: Family Medicine

## 2012-11-25 ENCOUNTER — Ambulatory Visit (INDEPENDENT_AMBULATORY_CARE_PROVIDER_SITE_OTHER): Payer: Medicare Other | Admitting: Family Medicine

## 2012-11-25 ENCOUNTER — Encounter: Payer: Self-pay | Admitting: Family Medicine

## 2012-11-25 VITALS — BP 132/72 | HR 66 | Temp 98.2°F | Ht 63.5 in | Wt 154.1 lb

## 2012-11-25 DIAGNOSIS — K589 Irritable bowel syndrome without diarrhea: Secondary | ICD-10-CM

## 2012-11-25 DIAGNOSIS — M5412 Radiculopathy, cervical region: Secondary | ICD-10-CM

## 2012-11-25 DIAGNOSIS — E785 Hyperlipidemia, unspecified: Secondary | ICD-10-CM

## 2012-11-25 DIAGNOSIS — E039 Hypothyroidism, unspecified: Secondary | ICD-10-CM | POA: Diagnosis not present

## 2012-11-25 DIAGNOSIS — L299 Pruritus, unspecified: Secondary | ICD-10-CM

## 2012-11-25 DIAGNOSIS — L259 Unspecified contact dermatitis, unspecified cause: Secondary | ICD-10-CM | POA: Diagnosis not present

## 2012-11-25 MED ORDER — HYDROXYZINE HCL 10 MG PO TABS: 10.0000 mg | ORAL_TABLET | Freq: Four times a day (QID) | ORAL | Status: DC | PRN

## 2012-11-25 NOTE — Patient Instructions (Signed)
Labs prior to next visit to include, lipid, renal, cbc, tsh, hepatic  Probiotic Digestive Advantage by [employer] Consider switch from Advil to Aleve 220 mg  1 or 2 tabs twice daily Tylenol/Acetaminophen 3000mg /24 day

## 2012-11-29 ENCOUNTER — Encounter: Payer: Self-pay | Admitting: Family Medicine

## 2012-11-29 NOTE — Assessment & Plan Note (Signed)
A week ago developed some numbness and difficulty with grip strength in lef thand. Not worsening. wil discuss with Dr Danielle Dess whom she has a call into and seek care if symptoms worsen.

## 2012-11-29 NOTE — Assessment & Plan Note (Signed)
Tolerating Simvastatin, add MegaRed caps daily, avoid trans fats and minimize simple carbs and saturated fats

## 2012-11-29 NOTE — Assessment & Plan Note (Signed)
Asymptomatic, TSH wnl

## 2012-11-29 NOTE — Progress Notes (Signed)
Patient ID: Chelsea H[facility], female   DOB: 24-May-1934, 79 y.o.   MRN: [id number] [name] [id number] [birth year] 11/29/2012      Progress Note-Follow Up  Subjective  Chief Complaint  Chief Complaint  Patient presents with  . transfer from Dr Chelsea Hancock    HPI  Patient is a 65 we'll Caucasian female who is in today to reestablish care. For distance considerations she had switched providers to a different location but has chosen to return to here for care. Offers no acute complaints today. Does have ongoing issues with irritable bowel but these are not flared presently. No recent illness. No headaches, fevers, chills, chest pain, palpitations, shortness of breath, GI or GU concerns otherwise noted  Past Medical History  Diagnosis Date  . Hyperlipidemia     takes Zetia and Simvastatin nightly  . Palpitation   . Carotid bruit     Right  . Edema   . Arthritis   . PONV (postoperative nausea and vomiting)   . Hyperlipidemia 01/27/2012  . Allergy     portabella mushrooms/diarrhea  . Heart murmur   . Osteoporosis   . Fibromyalgia   . Thrush 03/23/2012  . Vaginitis 03/23/2012  . Sigmoid colon ulcer 03/23/2012  . Migraine 04/27/2012  . Hyperglycemia 04/27/2012  . Dysrhythmia     paroxysmal A fib, irregular heartbeat;takes Bisoprolol nightly  . History of migraine     last about 77yrs ago  . Foot drop, right   . Joint pain   . Joint swelling   . Osteoporosis   . Chronic back pain   . Skin problem in pregnancy     takes gabapentin 3 times a day and also has an ointment  . GERD (gastroesophageal reflux disease)     takes Zantac and Nexium daily  . Hemorrhoids   . History of colon polyps   . Diverticulosis   . IBS (irritable bowel syndrome)     takes align daily  . Cataracts, bilateral     Past Surgical History  Procedure Laterality Date  . Abdominal hysterectomy  1981  . Laminectomy  1960  . Veins stripped  1970  . Total hip arthroplasty  2012    Left  . Carpal  tunnel release      left, ulnar nerve release at elbow  . Cervical fusion  2003, 2005, 2013  . Back surgery  1997    x 2  . Tonsillectomy  1943  . Joint replacement  2012    left   . Left elbow surgery  06/03/11  . Colonoscopy    . Posterior cervical fusion/foraminotomy  08/03/2012    Procedure: POSTERIOR CERVICAL FUSION/FORAMINOTOMY LEVEL 5;  Surgeon: Chelsea Abu, MD;  [facility]: [facility];  Service: Neurosurgery;  Laterality: N/A;  Cervical four to Thoracic two Posterior cervical decompression with Facet and Pedicle screw fixation    Family History  Problem Relation Age of Onset  . Arthritis      family hx  . Cancer      family hx  . Stroke    . Mental illness    . Hyperlipidemia    . Hypertension    . Cancer Mother   . Stomach cancer Mother   . Stroke Father   . Colon cancer Father   . HIV Son     Aids  . Anesthesia problems Neg Hx   . Stomach cancer Maternal Grandfather   . Stomach cancer Paternal Grandmother     History  Social History  . Marital Status: Divorced    Spouse Name: N/A    Number of Children: N/A  . Years of Education: N/A   Occupational History  . Not on file.   Social History Main Topics  . Smoking status: Former Smoker -- 4 years    Types: Cigarettes  . Smokeless tobacco: Never Used     Comment: quit 20+yrs ago  . Alcohol Use: 8.4 oz/week    14 Glasses of wine per week     Comment: WINE WITH DINNER. 2 GLASSES RED WINE  . Drug Use: No  . Sexually Active: No   Other Topics Concern  . Not on file   Social History Narrative  . No narrative on file    Current Outpatient Prescriptions on File Prior to Visit  Medication Sig Dispense Refill  . aspirin EC 81 MG tablet Take 81 mg by mouth daily.      . Biotin 5 MG CAPS Take 1 capsule by mouth daily.        . bisoprolol (ZEBETA) 5 MG tablet Take 1 tablet (5 mg total) by mouth at bedtime.  30 tablet  5  . Calcium Carbonate-Vitamin D (CALCIUM 600+D) 600-200 MG-UNIT TABS Take 1 tablet by  mouth daily after breakfast. [facility]       . citalopram (CELEXA) 10 MG tablet Take 1 tablet (10 mg total) by mouth daily.  90 tablet  1  . clobetasol cream (TEMOVATE) 0.05 % as needed.      . Clobetasol Prop Oint-Coal Tar (CLOBETAPLUS OINTMENT EX) Apply 1 application topically 2 (two) times daily as needed. For rash      . cyclobenzaprine (FLEXERIL) 10 MG tablet Take 1 tablet (10 mg total) by mouth 3 (three) times daily as needed for muscle spasms.  30 tablet  5  . esomeprazole (NEXIUM) 40 MG capsule Take 40 mg by mouth at bedtime.      Marland Kitchen ESZOPICLONE 3 MG tablet take 1 tablet by mouth immediately BEFORE BEDTIME  30 tablet  2  . ezetimibe (ZETIA) 10 MG tablet Take 10 mg by mouth at bedtime.      . gabapentin (NEURONTIN) 600 MG tablet take 1 tablet by mouth three times a day  90 tablet  2  . Magnesium 250 MG TABS Take 250 mg by mouth daily. [facility]      . Multiple Vitamin (MULITIVITAMIN WITH MINERALS) TABS Take 1 tablet by mouth daily.      . NON FORMULARY Take 1 capsule by mouth daily. Mega Red      . nortriptyline (PAMELOR) 25 MG capsule take 2 capsules by mouth at bedtime  60 capsule  2  . PREMARIN 0.45 MG tablet take 1 tablet by mouth once daily for 21 DAYS THEN DO NOT TAKE FOR 7 DAYS AS DIRECTED  30 each  3  . ranitidine (ZANTAC) 300 MG tablet take 1 tablet by mouth at bedtime  30 tablet  3  . simvastatin (ZOCOR) 40 MG tablet Take 1 tablet (40 mg total) by mouth every evening.  30 tablet  11  . torsemide (DEMADEX) 10 MG tablet take 1 tablet by mouth every morning  30 tablet  3  . ZETIA 10 MG tablet take 1 tablet by mouth once daily  30 tablet  2  . zoledronic acid (RECLAST) 5 MG/100ML SOLN Inject 5 mg into the vein once.       No current facility-administered medications on file prior to visit.  Allergies  Allergen Reactions  . Alendronate Sodium Other (See Comments)    Severe chest pain similar to heart attack   . Cefuroxime Axetil Other (See Comments)    unknown  . Codeine  Nausea And Vomiting  . Macrodantin   . Meperidine Hcl Nausea And Vomiting  . Other Other (See Comments)    Hismanal and Maprobamate  portobello mushrooms - diarrhea  . Sulfonamide Derivatives Hives, Itching and Swelling    Review of Systems  Review of Systems  Constitutional: Negative for fever and malaise/fatigue.  HENT: Negative for congestion.   Eyes: Negative for discharge.  Respiratory: Negative for shortness of breath.   Cardiovascular: Negative for chest pain, palpitations and leg swelling.  Gastrointestinal: Negative for nausea, abdominal pain and diarrhea.  Genitourinary: Negative for dysuria.  Musculoskeletal: Positive for back pain. Negative for falls.  Skin: Negative for rash.  Neurological: Positive for sensory change and focal weakness. Negative for loss of consciousness and headaches.       Left hand  Endo/Heme/Allergies: Negative for polydipsia.  Psychiatric/Behavioral: Negative for depression and suicidal ideas. The patient is not nervous/anxious and does not have insomnia.     Objective  BP 132/72  Pulse 66  Temp(Src) 98.2 F (36.8 C) (Oral)  Ht 5' 3.5" (1.613 m)  Wt 154 lb 1.9 oz (69.908 kg)  BMI 26.87 kg/m2  SpO2 93%  Physical Exam  Physical Exam  Constitutional: She is oriented to person, place, and time and well-developed, well-nourished, and in no distress. No distress.  HENT:  Head: Normocephalic and atraumatic.  Eyes: Conjunctivae are normal.  Neck: Neck supple. No thyromegaly present.  Cardiovascular: Normal rate, regular rhythm and normal heart sounds.   No murmur heard. Pulmonary/Chest: Effort normal and breath sounds normal. She has no wheezes.  Abdominal: She exhibits no distension and no mass.  Musculoskeletal: She exhibits no edema.  Lymphadenopathy:    She has no cervical adenopathy.  Neurological: She is alert and oriented to person, place, and time.  Skin: Skin is warm and dry. No rash noted. She is not diaphoretic.   Psychiatric: Memory, affect and judgment normal.    Lab Results  Component Value Date   TSH 2.63 04/20/2012   Lab Results  Component Value Date   WBC 9.9 07/28/2012   HGB 13.8 07/28/2012   HCT 40.3 07/28/2012   MCV 89.8 07/28/2012   PLT 240 07/28/2012   Lab Results  Component Value Date   CREATININE 0.83 07/28/2012   BUN 26* 07/28/2012   NA 140 07/28/2012   K 3.6 07/28/2012   CL 102 07/28/2012   CO2 28 07/28/2012   Lab Results  Component Value Date   ALT 22 04/20/2012   AST 32 04/20/2012   ALKPHOS 94 04/20/2012   BILITOT 0.5 04/20/2012   Lab Results  Component Value Date   CHOL 187 04/20/2012   Lab Results  Component Value Date   HDL 60.20 04/20/2012   Lab Results  Component Value Date   LDLCALC 89 04/20/2012   Lab Results  Component Value Date   TRIG 191.0* 04/20/2012   Lab Results  Component Value Date   CHOLHDL 3 04/20/2012     Assessment & Plan  IRRITABLE BOWEL SYNDROME Discussed need for increased fiber supplements, probiotics and increased fluids. Avoid offending foods  HYPOTHYROIDISM Asymptomatic, TSH wnl  HYPERLIPIDEMIA Tolerating Simvastatin, add MegaRed caps daily, avoid trans fats and minimize simple carbs and saturated fats  CERVICAL RADICULOPATHY A week ago developed some numbness and  difficulty with grip strength in lef thand. Not worsening. wil discuss with Dr Danielle Dess whom she has a call into and seek care if symptoms worsen.

## 2012-11-29 NOTE — Assessment & Plan Note (Signed)
Discussed need for increased fiber supplements, probiotics and increased fluids. Avoid offending foods

## 2012-12-01 DIAGNOSIS — M1710 Unilateral primary osteoarthritis, unspecified knee: Secondary | ICD-10-CM | POA: Diagnosis not present

## 2012-12-01 DIAGNOSIS — IMO0002 Reserved for concepts with insufficient information to code with codable children: Secondary | ICD-10-CM | POA: Diagnosis not present

## 2012-12-08 DIAGNOSIS — M1710 Unilateral primary osteoarthritis, unspecified knee: Secondary | ICD-10-CM | POA: Diagnosis not present

## 2012-12-08 DIAGNOSIS — IMO0002 Reserved for concepts with insufficient information to code with codable children: Secondary | ICD-10-CM | POA: Diagnosis not present

## 2012-12-10 DIAGNOSIS — G5600 Carpal tunnel syndrome, unspecified upper limb: Secondary | ICD-10-CM | POA: Diagnosis not present

## 2012-12-10 DIAGNOSIS — Q675 Congenital deformity of spine: Secondary | ICD-10-CM | POA: Diagnosis not present

## 2012-12-10 DIAGNOSIS — M5412 Radiculopathy, cervical region: Secondary | ICD-10-CM | POA: Diagnosis not present

## 2012-12-14 DIAGNOSIS — H2589 Other age-related cataract: Secondary | ICD-10-CM | POA: Diagnosis not present

## 2012-12-17 DIAGNOSIS — M1710 Unilateral primary osteoarthritis, unspecified knee: Secondary | ICD-10-CM | POA: Diagnosis not present

## 2012-12-17 DIAGNOSIS — IMO0002 Reserved for concepts with insufficient information to code with codable children: Secondary | ICD-10-CM | POA: Diagnosis not present

## 2012-12-22 ENCOUNTER — Ambulatory Visit (INDEPENDENT_AMBULATORY_CARE_PROVIDER_SITE_OTHER): Payer: Medicare Other | Admitting: Surgery

## 2012-12-27 ENCOUNTER — Other Ambulatory Visit: Payer: Self-pay | Admitting: Family Medicine

## 2012-12-27 NOTE — Telephone Encounter (Signed)
ESZOPICLONE 3 MG tablet request [Last Rx 01.16.14 #30x2]/SLS Please advise.

## 2012-12-31 ENCOUNTER — Other Ambulatory Visit: Payer: Self-pay | Admitting: Family Medicine

## 2013-01-06 DIAGNOSIS — L280 Lichen simplex chronicus: Secondary | ICD-10-CM | POA: Diagnosis not present

## 2013-01-07 ENCOUNTER — Other Ambulatory Visit: Payer: Self-pay | Admitting: Neurological Surgery

## 2013-01-07 DIAGNOSIS — M47812 Spondylosis without myelopathy or radiculopathy, cervical region: Secondary | ICD-10-CM

## 2013-01-13 ENCOUNTER — Other Ambulatory Visit: Payer: Self-pay | Admitting: Family Medicine

## 2013-01-24 ENCOUNTER — Other Ambulatory Visit: Payer: Self-pay | Admitting: Family Medicine

## 2013-01-24 DIAGNOSIS — S61409A Unspecified open wound of unspecified hand, initial encounter: Secondary | ICD-10-CM | POA: Diagnosis not present

## 2013-01-25 ENCOUNTER — Ambulatory Visit
Admission: RE | Admit: 2013-01-25 | Discharge: 2013-01-25 | Disposition: A | Discharge status: Home / Self Care | Payer: Medicare Other | Source: Ambulatory Visit | Attending: Neurological Surgery | Admitting: Neurological Surgery

## 2013-01-25 DIAGNOSIS — M47812 Spondylosis without myelopathy or radiculopathy, cervical region: Secondary | ICD-10-CM

## 2013-01-25 DIAGNOSIS — S12600A Unspecified displaced fracture of seventh cervical vertebra, initial encounter for closed fracture: Secondary | ICD-10-CM | POA: Diagnosis not present

## 2013-01-27 DIAGNOSIS — M5412 Radiculopathy, cervical region: Secondary | ICD-10-CM | POA: Diagnosis not present

## 2013-02-02 ENCOUNTER — Other Ambulatory Visit: Payer: Self-pay | Admitting: Family Medicine

## 2013-02-04 ENCOUNTER — Other Ambulatory Visit: Payer: Self-pay | Admitting: Family Medicine

## 2013-02-07 ENCOUNTER — Ambulatory Visit (INDEPENDENT_AMBULATORY_CARE_PROVIDER_SITE_OTHER): Payer: Medicare Other | Admitting: Surgery

## 2013-02-07 ENCOUNTER — Encounter (INDEPENDENT_AMBULATORY_CARE_PROVIDER_SITE_OTHER): Payer: Self-pay | Admitting: Surgery

## 2013-02-07 VITALS — BP 140/80 | HR 61 | Temp 97.0°F | Resp 18 | Ht 65.0 in | Wt 157.8 lb

## 2013-02-07 DIAGNOSIS — E042 Nontoxic multinodular goiter: Secondary | ICD-10-CM

## 2013-02-07 DIAGNOSIS — IMO0001 Reserved for inherently not codable concepts without codable children: Secondary | ICD-10-CM | POA: Diagnosis not present

## 2013-02-07 DIAGNOSIS — M1990 Unspecified osteoarthritis, unspecified site: Secondary | ICD-10-CM | POA: Diagnosis not present

## 2013-02-07 DIAGNOSIS — M810 Age-related osteoporosis without current pathological fracture: Secondary | ICD-10-CM | POA: Diagnosis not present

## 2013-02-07 NOTE — Progress Notes (Signed)
Patient scheduled for surgical appointment today by mistake. Patient was last evaluated in February 2014 for bilateral staples thyroid nodules. Plan is to obtain a repeat thyroid ultrasound in February 2016. Patient will return for surgical evaluation at that time. Her primary care physician we'll continue to monitor her TSH levels.  Velora Heckler, MD, [facility] Office: [phone]

## 2013-02-10 DIAGNOSIS — L280 Lichen simplex chronicus: Secondary | ICD-10-CM | POA: Diagnosis not present

## 2013-02-24 DIAGNOSIS — M810 Age-related osteoporosis without current pathological fracture: Secondary | ICD-10-CM | POA: Diagnosis not present

## 2013-03-01 ENCOUNTER — Telehealth: Payer: Self-pay | Admitting: Family Medicine

## 2013-03-01 ENCOUNTER — Other Ambulatory Visit: Payer: Self-pay | Admitting: Family Medicine

## 2013-03-01 DIAGNOSIS — L299 Pruritus, unspecified: Secondary | ICD-10-CM

## 2013-03-01 MED ORDER — HYDROXYZINE HCL 10 MG PO TABS: 10.0000 mg | ORAL_TABLET | Freq: Four times a day (QID) | ORAL | Status: DC | PRN

## 2013-03-01 NOTE — Telephone Encounter (Signed)
Refill- hydroxyzine hcl 10mg  tablet. Take one tablet by mouth every 6hrs if needed for itching or anxiety, insomnia. Qty 60 last fill 5.16.14

## 2013-03-12 ENCOUNTER — Other Ambulatory Visit: Payer: Self-pay | Admitting: Family Medicine

## 2013-03-21 ENCOUNTER — Other Ambulatory Visit (INDEPENDENT_AMBULATORY_CARE_PROVIDER_SITE_OTHER): Payer: Medicare Other

## 2013-03-21 ENCOUNTER — Other Ambulatory Visit: Payer: Self-pay | Admitting: Family Medicine

## 2013-03-21 DIAGNOSIS — E785 Hyperlipidemia, unspecified: Secondary | ICD-10-CM | POA: Diagnosis not present

## 2013-03-21 DIAGNOSIS — E039 Hypothyroidism, unspecified: Secondary | ICD-10-CM

## 2013-03-21 LAB — LIPID PANEL
Cholesterol: 178 mg/dL (ref 0–200)
HDL: 56.3 mg/dL (ref >39.00)
LDL Cholesterol: 82 mg/dL (ref 0–99)
Total CHOL/HDL Ratio: 3
Triglycerides: 199 mg/dL — ABNORMAL HIGH (ref 0.0–149.0)
VLDL: 39.8 mg/dL (ref 0.0–40.0)

## 2013-03-21 LAB — RENAL FUNCTION PANEL
Albumin: 3.9 g/dL (ref 3.5–5.2)
BUN: 20 mg/dL (ref 6–23)
CO2: 23 mEq/L (ref 19–32)
Calcium: 8.3 mg/dL — ABNORMAL LOW (ref 8.4–10.5)
Chloride: 104 mEq/L (ref 96–112)
Creatinine, Ser: 0.8 mg/dL (ref 0.4–1.2)
GFR: 75.1 mL/min (ref >60.00)
Glucose, Bld: 86 mg/dL (ref 70–99)
Phosphorus: 2.4 mg/dL (ref 2.3–4.6)
Potassium: 3.9 mEq/L (ref 3.5–5.1)
Sodium: 136 mEq/L (ref 135–145)

## 2013-03-21 LAB — CBC
HCT: 37.3 % (ref 36.0–46.0)
Hemoglobin: 12.6 g/dL (ref 12.0–15.0)
MCHC: 33.8 g/dL (ref 30.0–36.0)
MCV: 94 fl (ref 78.0–100.0)
Platelets: 211 10*3/uL (ref 150.0–400.0)
RBC: 3.97 Mil/uL (ref 3.87–5.11)
RDW: 13.5 % (ref 11.5–14.6)
WBC: 6.6 10*3/uL (ref 4.5–10.5)

## 2013-03-21 LAB — HEPATIC FUNCTION PANEL
ALT: 20 U/L (ref 0–35)
AST: 26 U/L (ref 0–37)
Albumin: 3.9 g/dL (ref 3.5–5.2)
Alkaline Phosphatase: 78 U/L (ref 39–117)
Bilirubin, Direct: 0 mg/dL (ref 0.0–0.3)
Total Bilirubin: 0.4 mg/dL (ref 0.3–1.2)
Total Protein: 6.6 g/dL (ref 6.0–8.3)

## 2013-03-21 NOTE — Progress Notes (Signed)
Labs only

## 2013-03-22 LAB — TSH: TSH: 4.38 u[IU]/mL (ref 0.35–5.50)

## 2013-03-23 NOTE — Progress Notes (Signed)
Quick Note:  Patient Informed and voiced understanding.  I will check with Katrina on the vitamin d being added ______

## 2013-03-29 ENCOUNTER — Encounter: Payer: Self-pay | Admitting: Family Medicine

## 2013-03-29 ENCOUNTER — Ambulatory Visit (INDEPENDENT_AMBULATORY_CARE_PROVIDER_SITE_OTHER): Payer: Medicare Other | Admitting: Family Medicine

## 2013-03-29 DIAGNOSIS — E039 Hypothyroidism, unspecified: Secondary | ICD-10-CM

## 2013-03-29 DIAGNOSIS — D649 Anemia, unspecified: Secondary | ICD-10-CM

## 2013-03-29 DIAGNOSIS — E785 Hyperlipidemia, unspecified: Secondary | ICD-10-CM

## 2013-03-29 DIAGNOSIS — M899 Disorder of bone, unspecified: Secondary | ICD-10-CM | POA: Diagnosis not present

## 2013-03-29 DIAGNOSIS — K219 Gastro-esophageal reflux disease without esophagitis: Secondary | ICD-10-CM

## 2013-03-29 DIAGNOSIS — F32A Depression, unspecified: Secondary | ICD-10-CM

## 2013-03-29 DIAGNOSIS — F329 Major depressive disorder, single episode, unspecified: Secondary | ICD-10-CM | POA: Diagnosis not present

## 2013-03-29 DIAGNOSIS — F341 Dysthymic disorder: Secondary | ICD-10-CM

## 2013-03-29 DIAGNOSIS — L299 Pruritus, unspecified: Secondary | ICD-10-CM

## 2013-03-29 DIAGNOSIS — F3289 Other specified depressive episodes: Secondary | ICD-10-CM

## 2013-03-29 DIAGNOSIS — F418 Other specified anxiety disorders: Secondary | ICD-10-CM

## 2013-03-29 DIAGNOSIS — M5412 Radiculopathy, cervical region: Secondary | ICD-10-CM

## 2013-03-29 HISTORY — DX: Other specified anxiety disorders: F41.8

## 2013-03-29 MED ORDER — TRIAMCINOLONE ACETONIDE 0.1 % EX CREA: TOPICAL_CREAM | Freq: Two times a day (BID) | CUTANEOUS | Status: DC | PRN

## 2013-03-29 MED ORDER — CITALOPRAM HYDROBROMIDE 20 MG PO TABS: 20.0000 mg | ORAL_TABLET | Freq: Every day | ORAL | Status: DC

## 2013-03-29 NOTE — Patient Instructions (Addendum)
Start Niacin ER 500 mg at bedtime with a lowfat snack and 81 mg ECASA 1/2 before  Salon Pas cream or patches  Digestive Advantage probiotic daily Witch Hazel Astringent as needed  Labs prior to next visit lipid, renal vitamind cbc tsh, hepatic  Hypercholesterolemia High Blood Cholesterol Cholesterol is a white, waxy, fat-like protein needed by your body in small amounts. The liver makes all the cholesterol you need. It is carried from the liver by the blood through the blood vessels. Deposits (plaque) may build up on blood vessel walls. This makes the arteries narrower and stiffer. Plaque increases the risk for heart attack and stroke. You cannot feel your cholesterol level even if it is very high. The only way to know is by a blood test to check your lipid (fats) levels. Once you know your cholesterol levels, you should keep a record of the test results. Work with your caregiver to to keep your levels in the desired range. WHAT THE RESULTS MEAN:  Total cholesterol is a rough measure of all the cholesterol in your blood.  LDL is the so-called bad cholesterol. This is the type that deposits cholesterol in the walls of the arteries. You want this level to be low.  HDL is the good cholesterol because it cleans the arteries and carries the LDL away. You want this level to be high.  Triglycerides are fat that the body can either burn for energy or store. High levels are closely linked to heart disease. DESIRED LEVELS:  Total cholesterol below 200.  LDL below 100 for people at risk, below 70 for very high risk.  HDL above 50 is good, above 60 is best.  Triglycerides below 150. HOW TO LOWER YOUR CHOLESTEROL:  Diet.  Choose fish or white meat chicken and [place], roasted or baked. Limit fatty cuts of red meat, fried foods, and processed meats, such as sausage and lunch meat.  Eat lots of fresh fruits and vegetables. Choose whole grains, beans, pasta, potatoes and cereals.  Use only small  amounts of olive, corn or canola oils. Avoid butter, mayonnaise, shortening or palm kernel oils. Avoid foods with trans-fats.  Use skim/nonfat milk and low-fat/nonfat yogurt and cheeses. Avoid whole milk, cream, ice cream, egg yolks and cheeses. Healthy desserts include angel food cake, gingersnaps, animal crackers, hard candy, popsicles, and low-fat/nonfat frozen yogurt. Avoid pastries, cakes, pies and cookies.  Exercise.  A regular program helps decrease LDL and raises HDL.  Helps with weight control.  Do things that increase your activity level like gardening, walking, or taking the stairs.  Medication.  May be prescribed by your caregiver to help lowering cholesterol and the risk for heart disease.  You may need medicine even if your levels are normal if you have several risk factors. HOME CARE INSTRUCTIONS   Follow your diet and exercise programs as suggested by your caregiver.  Take medications as directed.  Have blood work done when your caregiver feels it is necessary. MAKE SURE YOU:   Understand these instructions.  Will watch your condition.  Will get help right away if you are not doing well or get worse. Document Released: 09/08/2005 Document Revised: 12/01/2011 Document Reviewed: 02/24/2007 [facility] Patient Information 2014 [place], [place].

## 2013-03-29 NOTE — Progress Notes (Signed)
Patient ID: Chelsea H[facility], female   DOB: [birth year], 79 y.o.   MRN: [id number] [facility] [id number] Jan 28, 1934 03/29/2013      Progress Note-Follow Up  Subjective  Chief Complaint  Chief Complaint  Patient presents with  . Follow-up    4 month    HPI  Patient is a 79 year old Caucasian female who is here today in followup. She is under a great deal of stress secondary to her significant other being diagnosed with cancer and moving into her home. She's cooking and cleaning for both of them and he is unable to do that and. He had been living independently in with her and the diagnosis. She does knowledge her mood has been low and her energy is low managing distress. Denies suicidal ideation. He is physically doing well except for her knee pain. Her main pain gets severe enough that her knee will buckle on occasions and it has caused some falls. When she takes ibuprofen the knee pain is well managed but unfortunately when she takes it routinely she has GI disturbances. She's not taking any bloody or tarry stool the present time. No constipation or diarrhea. No nausea vomiting or abdominal pain. No chest pain, palpitations or shortness of breath are noted at this time.  Past Medical History  Diagnosis Date  . Hyperlipidemia     takes Zetia and Simvastatin nightly  . Palpitation   . Carotid bruit     Right  . Edema   . Arthritis   . PONV (postoperative nausea and vomiting)   . Hyperlipidemia 01/27/2012  . Allergy     portabella mushrooms/diarrhea  . Heart murmur   . Osteoporosis   . Fibromyalgia   . Thrush 03/23/2012  . Vaginitis 03/23/2012  . Sigmoid colon ulcer 03/23/2012  . Migraine 04/27/2012  . Hyperglycemia 04/27/2012  . Dysrhythmia     paroxysmal A fib, irregular heartbeat;takes Bisoprolol nightly  . History of migraine     last about 43yrs ago  . Foot drop, right   . Joint pain   . Joint swelling   . Osteoporosis   . Chronic back pain   . Skin problem in  pregnancy     takes gabapentin 3 times a day and also has an ointment  . GERD (gastroesophageal reflux disease)     takes Zantac and Nexium daily  . Hemorrhoids   . History of colon polyps   . Diverticulosis   . IBS (irritable bowel syndrome)     takes align daily  . Cataracts, bilateral   . Hypocalcemia 03/29/2013    Past Surgical History  Procedure Laterality Date  . Abdominal hysterectomy  1981  . Laminectomy  1960  . Veins stripped  1970  . Total hip arthroplasty  2012    Left  . Carpal tunnel release      left, ulnar nerve release at elbow  . Cervical fusion  2003, 2005, 2013  . Back surgery  1997    x 2  . Tonsillectomy  1943  . Joint replacement  2012    left   . Left elbow surgery  06/03/11  . Colonoscopy    . Posterior cervical fusion/foraminotomy  08/03/2012    Procedure: POSTERIOR CERVICAL FUSION/FORAMINOTOMY LEVEL 5;  Surgeon: Barnett Abu, MD;  Location: [facility];  Service: Neurosurgery;  Laterality: N/A;  Cervical four to Thoracic two Posterior cervical decompression with Facet and Pedicle screw fixation    Family History  Problem Relation Age  of Onset  . Arthritis      family hx  . Cancer      family hx  . Stroke    . Mental illness    . Hyperlipidemia    . Hypertension    . Cancer Mother   . Stomach cancer Mother   . Stroke Father   . Colon cancer Father   . HIV Son     Aids  . Anesthesia problems Neg Hx   . Stomach cancer Maternal Grandfather   . Stomach cancer Paternal Grandmother     History   Social History  . Marital Status: Divorced    Spouse Name: N/A    Number of Children: N/A  . Years of Education: N/A   Occupational History  . Not on file.   Social History Main Topics  . Smoking status: Former Smoker -- 4 years    Types: Cigarettes  . Smokeless tobacco: Never Used     Comment: quit 20+yrs ago  . Alcohol Use: 8.4 oz/week    14 Glasses of wine per week     Comment: WINE WITH DINNER. 2 GLASSES RED WINE  . Drug Use: No   . Sexually Active: No   Other Topics Concern  . Not on file   Social History Narrative  . No narrative on file    Current Outpatient Prescriptions on File Prior to Visit  Medication Sig Dispense Refill  . aspirin EC 81 MG tablet Take 81 mg by mouth daily.      . Biotin 5 MG CAPS Take 1 capsule by mouth daily.        . bisoprolol (ZEBETA) 5 MG tablet Take 1 tablet (5 mg total) by mouth at bedtime.  30 tablet  5  . Calcium Carbonate-Vitamin D (CALCIUM 600+D) 600-200 MG-UNIT TABS Take 1 tablet by mouth daily after breakfast. Dr. Phylliss Bob       . Clobetasol Prop Oint-Coal Tar (CLOBETAPLUS OINTMENT EX) Apply 1 application topically 2 (two) times daily as needed. For rash      . cyclobenzaprine (FLEXERIL) 10 MG tablet Take 1 tablet (10 mg total) by mouth 3 (three) times daily as needed for muscle spasms.  30 tablet  5  . esomeprazole (NEXIUM) 40 MG capsule Take 40 mg by mouth at bedtime.      . Eszopiclone 3 MG TABS TAKE 1 TABLET BY MOUTH IMMEDIATELY BEFORE BEDTIME  30 tablet  2  . gabapentin (NEURONTIN) 600 MG tablet take 1 tablet by mouth three times a day  90 tablet  0  . hydrOXYzine (ATARAX/VISTARIL) 10 MG tablet Take 1 tablet (10 mg total) by mouth every 6 (six) hours as needed for itching or anxiety (insomnia).  60 tablet  2  . Magnesium 250 MG TABS Take 250 mg by mouth daily. Dr. Phylliss Bob      . Multiple Vitamin (MULITIVITAMIN WITH MINERALS) TABS Take 1 tablet by mouth daily.      . NON FORMULARY Take 1 capsule by mouth daily. Mega Red      . nortriptyline (PAMELOR) 25 MG capsule take 2 capsules by mouth at bedtime  60 capsule  2  . PREMARIN 0.45 MG tablet take 1 tablet by mouth once daily for 21 DAYS THWEN DO NOT TAKE FOR 7 DAYS AS DIRECTED  30 tablet  3  . ranitidine (ZANTAC) 300 MG tablet take 1 tablet by mouth at bedtime  30 tablet  3  . simvastatin (ZOCOR) 40 MG tablet take 1 tablet  by mouth every evening  30 tablet  11  . torsemide (DEMADEX) 10 MG tablet TAKE 1 TABLET BY MOUTH EVERY  MORNING  30 tablet  0  . ZETIA 10 MG tablet take 1 tablet by mouth once daily  30 tablet  2  . zoledronic acid (RECLAST) 5 MG/100ML SOLN Inject 5 mg into the vein once.       No current facility-administered medications on file prior to visit.    Allergies  Allergen Reactions  . Alendronate Sodium Other (See Comments)    Severe chest pain similar to heart attack   . Cefuroxime Axetil Other (See Comments)    unknown  . Codeine Nausea And Vomiting  . Macrodantin   . Meperidine Hcl Nausea And Vomiting  . Other Other (See Comments)    Hismanal and Maprobamate  portobello mushrooms - diarrhea  . Sulfonamide Derivatives Hives, Itching and Swelling    Review of Systems  Review of Systems  Constitutional: Negative for fever and malaise/fatigue.  HENT: Negative for congestion.   Eyes: Negative for discharge.  Respiratory: Negative for shortness of breath.   Cardiovascular: Negative for chest pain, palpitations and leg swelling.  Gastrointestinal: Negative for nausea, abdominal pain and diarrhea.  Genitourinary: Negative for dysuria.  Musculoskeletal: Negative for falls.  Skin: Positive for itching and rash.       Excoriated lesions along right arm   Neurological: Negative for loss of consciousness and headaches.  Endo/Heme/Allergies: Negative for polydipsia.  Psychiatric/Behavioral: Negative for depression and suicidal ideas. The patient is not nervous/anxious and does not have insomnia.     Objective  BP 100/64  Pulse 62  Temp(Src) 98.3 F (36.8 C) (Oral)  Ht 5\' 5"  (1.651 m)  Wt 162 lb 0.6 oz (73.501 kg)  BMI 26.96 kg/m2  SpO2 96%  Physical Exam  Physical Exam  Constitutional: She is oriented to person, place, and time and well-developed, well-nourished, and in no distress. No distress.  HENT:  Head: Normocephalic and atraumatic.  Eyes: Conjunctivae are normal.  Neck: Neck supple. No thyromegaly present.  Cardiovascular: Normal rate and regular rhythm.  Exam  reveals no gallop.   No murmur heard. Pulmonary/Chest: Effort normal and breath sounds normal. She has no wheezes.  Abdominal: She exhibits no distension and no mass.  Musculoskeletal: She exhibits no edema.  Lymphadenopathy:    She has no cervical adenopathy.  Neurological: She is alert and oriented to person, place, and time.  Skin: Skin is warm and dry. Rash noted. She is not diaphoretic.  Right arm scattered excoriated and scabbed spots on arm  Psychiatric: Memory, affect and judgment normal.    Lab Results  Component Value Date   TSH 4.38 03/21/2013   Lab Results  Component Value Date   WBC 6.6 03/21/2013   HGB 12.6 03/21/2013   HCT 37.3 03/21/2013   MCV 94.0 03/21/2013   PLT 211.0 03/21/2013   Lab Results  Component Value Date   CREATININE 0.8 03/21/2013   BUN 20 03/21/2013   NA 136 03/21/2013   K 3.9 03/21/2013   CL 104 03/21/2013   CO2 23 03/21/2013   Lab Results  Component Value Date   ALT 20 03/21/2013   AST 26 03/21/2013   ALKPHOS 78 03/21/2013   BILITOT 0.4 03/21/2013   Lab Results  Component Value Date   CHOL 178 03/21/2013   Lab Results  Component Value Date   HDL 56.30 03/21/2013   Lab Results  Component Value Date   LDLCALC  82 03/21/2013   Lab Results  Component Value Date   TRIG 199.0* 03/21/2013   Lab Results  Component Value Date   CHOLHDL 3 03/21/2013     Assessment & Plan  Hypocalcemia Check Vitamin D level today, recheck calcium today, increase ca/vit D tp bid  OSTEOPENIA Increase ca/vit d to bid  ANEMIA Resolved with labs   HYPOTHYROIDISM Normal TSH prior to visit.   HYPERLIPIDEMIA Will add niacin ER 500 mg by mouth each bedtime. Will set an aspirin half an hour prior to administration. Avoid transplants and increase exercise  CERVICAL RADICULOPATHY Had significant trouble with her right arm is developed some pruritus and chronic itching. Is given some triamcinolone cream to apply twice daily as needed. Is using hydroxyzine at bedtime  with some relief. May also try witch hazel when necessary. Followup with dermatologist Dr. Terri Piedra if symptoms worsen or do not improve  GERD Adequately controlled on Omeprazole and Ranitidine patient reports symptoms not controlled if not on both  Depression with anxiety Will increase citalopram to 20 mg po daily and reassess at next visit

## 2013-03-29 NOTE — Assessment & Plan Note (Signed)
Had significant trouble with her right arm is developed some pruritus and chronic itching. Is given some triamcinolone cream to apply twice daily as needed. Is using hydroxyzine at bedtime with some relief. May also try witch hazel when necessary. Followup with dermatologist Dr. Terri Piedra if symptoms worsen or do not improve

## 2013-03-29 NOTE — Assessment & Plan Note (Signed)
Will increase citalopram to 20 mg po daily and reassess at next visit

## 2013-03-29 NOTE — Assessment & Plan Note (Signed)
Increase ca/vit d to bid

## 2013-03-29 NOTE — Assessment & Plan Note (Signed)
Resolved with labs 

## 2013-03-29 NOTE — Assessment & Plan Note (Signed)
Adequately controlled on Omeprazole and Ranitidine patient reports symptoms not controlled if not on both

## 2013-03-29 NOTE — Assessment & Plan Note (Signed)
Normal TSH prior to visit.

## 2013-03-29 NOTE — Assessment & Plan Note (Signed)
Check Vitamin D level today, recheck calcium today, increase ca/vit D tp bid

## 2013-03-29 NOTE — Assessment & Plan Note (Signed)
Will add niacin ER 500 mg by mouth each bedtime. Will set an aspirin half an hour prior to administration. Avoid transplants and increase exercise

## 2013-03-30 LAB — VITAMIN D 25 HYDROXY (VIT D DEFICIENCY, FRACTURES): Vit D, 25-Hydroxy: 47 ng/mL (ref 30–89)

## 2013-04-04 NOTE — Progress Notes (Signed)
Quick Note:  Patient Informed and voiced understanding ______ 

## 2013-04-07 ENCOUNTER — Other Ambulatory Visit: Payer: Self-pay | Admitting: Family Medicine

## 2013-04-07 MED ORDER — GABAPENTIN 600 MG PO TABS: 600.0000 mg | ORAL_TABLET | Freq: Three times a day (TID) | ORAL | Status: DC

## 2013-04-07 NOTE — Telephone Encounter (Signed)
Please advise Gabapentin refill? Last RX was done on 01-24-13 quantity 90 with 0 refills

## 2013-04-07 NOTE — Telephone Encounter (Signed)
New prescription request  Gabapentin 600mg . 1 TID

## 2013-04-10 ENCOUNTER — Other Ambulatory Visit: Payer: Self-pay | Admitting: Family Medicine

## 2013-04-11 NOTE — Telephone Encounter (Signed)
Rx request to pharmacy/SLS  

## 2013-04-12 ENCOUNTER — Other Ambulatory Visit: Payer: Self-pay | Admitting: *Deleted

## 2013-04-12 MED ORDER — BISOPROLOL FUMARATE 5 MG PO TABS: 5.0000 mg | ORAL_TABLET | Freq: Every day | ORAL | Status: DC

## 2013-04-12 NOTE — Telephone Encounter (Signed)
Refill sent.

## 2013-04-27 ENCOUNTER — Other Ambulatory Visit: Payer: Self-pay

## 2013-04-27 DIAGNOSIS — S199XXA Unspecified injury of neck, initial encounter: Secondary | ICD-10-CM | POA: Diagnosis not present

## 2013-04-27 DIAGNOSIS — S0993XA Unspecified injury of face, initial encounter: Secondary | ICD-10-CM | POA: Diagnosis not present

## 2013-04-27 DIAGNOSIS — G5620 Lesion of ulnar nerve, unspecified upper limb: Secondary | ICD-10-CM | POA: Diagnosis not present

## 2013-04-27 DIAGNOSIS — T148XXA Other injury of unspecified body region, initial encounter: Secondary | ICD-10-CM | POA: Diagnosis not present

## 2013-05-08 ENCOUNTER — Other Ambulatory Visit: Payer: Self-pay | Admitting: Family Medicine

## 2013-05-13 DIAGNOSIS — G5620 Lesion of ulnar nerve, unspecified upper limb: Secondary | ICD-10-CM | POA: Diagnosis not present

## 2013-05-13 DIAGNOSIS — G5600 Carpal tunnel syndrome, unspecified upper limb: Secondary | ICD-10-CM | POA: Diagnosis not present

## 2013-05-13 DIAGNOSIS — M5412 Radiculopathy, cervical region: Secondary | ICD-10-CM | POA: Diagnosis not present

## 2013-05-16 ENCOUNTER — Other Ambulatory Visit: Payer: Self-pay | Admitting: Family Medicine

## 2013-05-31 ENCOUNTER — Telehealth: Payer: Self-pay | Admitting: Cardiology

## 2013-05-31 DIAGNOSIS — R0989 Other specified symptoms and signs involving the circulatory and respiratory systems: Secondary | ICD-10-CM

## 2013-05-31 NOTE — Telephone Encounter (Signed)
New Prob      Pt called in to reschedule her carotid, however, the order has expired. New order needed in EPIC to schedule.

## 2013-06-01 ENCOUNTER — Other Ambulatory Visit: Payer: Self-pay | Admitting: Family Medicine

## 2013-06-02 DIAGNOSIS — G5600 Carpal tunnel syndrome, unspecified upper limb: Secondary | ICD-10-CM | POA: Diagnosis not present

## 2013-06-04 ENCOUNTER — Other Ambulatory Visit: Payer: Self-pay | Admitting: Family Medicine

## 2013-06-11 ENCOUNTER — Other Ambulatory Visit: Payer: Self-pay | Admitting: Family Medicine

## 2013-06-22 ENCOUNTER — Other Ambulatory Visit: Payer: Self-pay | Admitting: Family Medicine

## 2013-06-22 ENCOUNTER — Telehealth: Payer: Self-pay

## 2013-06-22 DIAGNOSIS — E785 Hyperlipidemia, unspecified: Secondary | ICD-10-CM | POA: Diagnosis not present

## 2013-06-22 DIAGNOSIS — M899 Disorder of bone, unspecified: Secondary | ICD-10-CM | POA: Diagnosis not present

## 2013-06-22 DIAGNOSIS — E039 Hypothyroidism, unspecified: Secondary | ICD-10-CM | POA: Diagnosis not present

## 2013-06-22 DIAGNOSIS — R739 Hyperglycemia, unspecified: Secondary | ICD-10-CM

## 2013-06-22 DIAGNOSIS — D649 Anemia, unspecified: Secondary | ICD-10-CM

## 2013-06-22 NOTE — Telephone Encounter (Signed)
Lab order placed.

## 2013-06-23 LAB — CBC
HCT: 40 % (ref 36.0–46.0)
Hemoglobin: 13.4 g/dL (ref 12.0–15.0)
MCH: 30.7 pg (ref 26.0–34.0)
MCHC: 33.5 g/dL (ref 30.0–36.0)
MCV: 91.7 fL (ref 78.0–100.0)
Platelets: 236 10*3/uL (ref 150–400)
RBC: 4.36 MIL/uL (ref 3.87–5.11)
RDW: 13.2 % (ref 11.5–15.5)
WBC: 3.7 10*3/uL — ABNORMAL LOW (ref 4.0–10.5)

## 2013-06-23 LAB — RENAL FUNCTION PANEL
Albumin: 3.8 g/dL (ref 3.5–5.2)
BUN: 19 mg/dL (ref 6–23)
CO2: 26 mEq/L (ref 19–32)
Calcium: 9.6 mg/dL (ref 8.4–10.5)
Chloride: 106 mEq/L (ref 96–112)
Creat: 0.82 mg/dL (ref 0.50–1.10)
Glucose, Bld: 91 mg/dL (ref 70–99)
Phosphorus: 3.9 mg/dL (ref 2.3–4.6)
Potassium: 5.3 mEq/L (ref 3.5–5.3)
Sodium: 139 mEq/L (ref 135–145)

## 2013-06-23 LAB — HEPATIC FUNCTION PANEL
ALT: 21 U/L (ref 0–35)
AST: 24 U/L (ref 0–37)
Albumin: 3.8 g/dL (ref 3.5–5.2)
Alkaline Phosphatase: 78 U/L (ref 39–117)
Bilirubin, Direct: <0.1 mg/dL (ref 0.0–0.3)
Total Bilirubin: 0.3 mg/dL (ref 0.3–1.2)
Total Protein: 6.4 g/dL (ref 6.0–8.3)

## 2013-06-23 LAB — LIPID PANEL
Cholesterol: 184 mg/dL (ref 0–200)
HDL: 55 mg/dL (ref >39)
LDL Cholesterol: 90 mg/dL (ref 0–99)
Total CHOL/HDL Ratio: 3.3 Ratio
Triglycerides: 194 mg/dL — ABNORMAL HIGH (ref <150)
VLDL: 39 mg/dL (ref 0–40)

## 2013-06-23 LAB — TSH: TSH: 4.525 u[IU]/mL — ABNORMAL HIGH (ref 0.350–4.500)

## 2013-06-23 NOTE — Telephone Encounter (Signed)
Order in computer, Left message to call back

## 2013-06-24 ENCOUNTER — Telehealth: Payer: Self-pay

## 2013-06-24 DIAGNOSIS — Z23 Encounter for immunization: Secondary | ICD-10-CM | POA: Diagnosis not present

## 2013-06-24 LAB — T4, FREE: Free T4: 1.14 ng/dL (ref 0.80–1.80)

## 2013-06-24 LAB — VITAMIN D 25 HYDROXY (VIT D DEFICIENCY, FRACTURES): Vit D, 25-Hydroxy: 58 ng/mL (ref 30–89)

## 2013-06-24 NOTE — Telephone Encounter (Signed)
Message copied by Court Joy on Fri Jun 24, 2013 10:19 AM ------      Message from: Danise Edge A      Created: Fri Jun 24, 2013 10:04 AM       Please see if they can add a free T4 for abnormal thyroid studies ------

## 2013-06-24 NOTE — Telephone Encounter (Signed)
Gave information to Katrina w/[facility]

## 2013-06-25 ENCOUNTER — Other Ambulatory Visit: Payer: Self-pay | Admitting: Family Medicine

## 2013-06-27 NOTE — Telephone Encounter (Signed)
Please advise refill? Last RX was done on 03-21-13 quantity 30 with 2 refills.  If ok fax to [phone]

## 2013-06-27 NOTE — Telephone Encounter (Signed)
RX faxed

## 2013-06-28 ENCOUNTER — Ambulatory Visit (INDEPENDENT_AMBULATORY_CARE_PROVIDER_SITE_OTHER): Payer: Medicare Other | Admitting: Family Medicine

## 2013-06-28 ENCOUNTER — Encounter: Payer: Self-pay | Admitting: Family Medicine

## 2013-06-28 ENCOUNTER — Other Ambulatory Visit (HOSPITAL_COMMUNITY)
Admission: RE | Admit: 2013-06-28 | Discharge: 2013-06-28 | Disposition: A | Discharge status: Home / Self Care | Payer: Medicare Other | Source: Ambulatory Visit | Attending: Family Medicine | Admitting: Family Medicine

## 2013-06-28 VITALS — BP 104/82 | HR 94 | Temp 98.4°F | Ht 63.0 in | Wt 165.0 lb

## 2013-06-28 DIAGNOSIS — M25569 Pain in unspecified knee: Secondary | ICD-10-CM

## 2013-06-28 DIAGNOSIS — E785 Hyperlipidemia, unspecified: Secondary | ICD-10-CM

## 2013-06-28 DIAGNOSIS — Z860100 Personal history of colon polyps, unspecified: Secondary | ICD-10-CM | POA: Insufficient documentation

## 2013-06-28 DIAGNOSIS — R000 Tachycardia, unspecified: Secondary | ICD-10-CM

## 2013-06-28 DIAGNOSIS — N760 Acute vaginitis: Secondary | ICD-10-CM

## 2013-06-28 DIAGNOSIS — B351 Tinea unguium: Secondary | ICD-10-CM

## 2013-06-28 DIAGNOSIS — E039 Hypothyroidism, unspecified: Secondary | ICD-10-CM

## 2013-06-28 DIAGNOSIS — M25561 Pain in right knee: Secondary | ICD-10-CM

## 2013-06-28 MED ORDER — BISOPROLOL FUMARATE 5 MG PO TABS: 5.0000 mg | ORAL_TABLET | Freq: Two times a day (BID) | ORAL | Status: DC

## 2013-06-28 NOTE — Assessment & Plan Note (Signed)
Continue MegaRed and add Metamucil daily, avoid trans fats

## 2013-06-28 NOTE — Patient Instructions (Addendum)
  Distilled white vinegar  Nonspecific Tachycardia Tachycardia is a faster than normal heartbeat (more than 100 beats per minute). In adults, the heart normally beats between 60 and 100 times a minute. A fast heartbeat may be a normal response to exercise or stress. It does not necessarily mean that something is wrong. However, sometimes when your heart beats too fast it may not be able to pump enough blood to the rest of your body. This can result in chest pain, shortness of breath, dizziness, and even fainting. Nonspecific tachycardia means that the specific cause or pattern of your tachycardia is unknown. CAUSES  Tachycardia may be harmless or it may be due to a more serious underlying cause. Possible causes of tachycardia include:  Exercise or exertion.  Fever.  Pain or injury.  Infection.  Loss of body fluids (dehydration).  Overactive thyroid.  Lack of red blood cells (anemia).  Anxiety and stress.  Alcohol.  Caffeine.  Tobacco products.  Diet pills.  Illegal drugs.  Heart disease. SYMPTOMS  Rapid or irregular heartbeat (palpitations).  Suddenly feeling your heart beating (cardiac awareness).  Dizziness.  Tiredness (fatigue).  Shortness of breath.  Chest pain.  Nausea.  Fainting. DIAGNOSIS  Your caregiver will perform a physical exam and take your medical history. In some cases, a heart specialist (cardiologist) may be consulted. Your caregiver may also order:  Blood tests.  Electrocardiography. This test records the electrical activity of your heart.  A heart monitoring test. TREATMENT  Treatment will depend on the likely cause of your tachycardia. The goal is to treat the underlying cause of your tachycardia. Treatment methods may include:  Replacement of fluids or blood through an intravenous (IV) tube for moderate to severe dehydration or anemia.  New medicines or changes in your current medicines.  Diet and lifestyle changes.  Treatment  for certain infections.  Stress relief or relaxation methods. HOME CARE INSTRUCTIONS   Rest.  Drink enough fluids to keep your urine clear or pale yellow.  Do not smoke.  Avoid:  Caffeine.  Tobacco.  Alcohol.  Chocolate.  Stimulants such as over-the-counter diet pills or pills that help you stay awake.  Situations that cause anxiety or stress.  Illegal drugs such as marijuana, phencyclidine (PCP), and cocaine.  Only take medicine as directed by your caregiver.  Keep all follow-up appointments as directed by your caregiver. SEEK IMMEDIATE MEDICAL CARE IF:   You have pain in your chest, upper arms, jaw, or neck.  You become weak, dizzy, or feel faint.  You have palpitations that will not go away.  You vomit, have diarrhea, or pass blood in your stool.  Your skin is cool, pale, and wet.  You have a fever that will not go away with rest, fluids, and medicine. MAKE SURE YOU:   Understand these instructions.  Will watch your condition.  Will get help right away if you are not doing well or get worse. Document Released: 10/16/2004 Document Revised: 12/01/2011 Document Reviewed: 08/19/2011 [facility] Patient Information 2014 [place], [place].
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
## 2013-07-02 ENCOUNTER — Other Ambulatory Visit: Payer: Self-pay | Admitting: Family Medicine

## 2013-07-03 ENCOUNTER — Encounter: Payer: Self-pay | Admitting: Family Medicine

## 2013-07-03 ENCOUNTER — Other Ambulatory Visit: Payer: Self-pay | Admitting: Family Medicine

## 2013-07-03 DIAGNOSIS — B351 Tinea unguium: Secondary | ICD-10-CM

## 2013-07-03 DIAGNOSIS — R000 Tachycardia, unspecified: Secondary | ICD-10-CM | POA: Insufficient documentation

## 2013-07-03 HISTORY — DX: Tinea unguium: B35.1

## 2013-07-03 NOTE — Assessment & Plan Note (Signed)
Bisoprolol 5 mg bid

## 2013-07-03 NOTE — Assessment & Plan Note (Signed)
R>L at this time, is following with ortho

## 2013-07-03 NOTE — Assessment & Plan Note (Signed)
Distilled white vinegar soaks and coat with Lamisil cream qhs

## 2013-07-03 NOTE — Assessment & Plan Note (Signed)
TSH mildly elevated and free T4 normal. No changes today

## 2013-07-04 NOTE — Telephone Encounter (Signed)
eScribe request for refill on Nortriptyline Last filled - 09.10.14, #[id number] Last AEX - 10.07.14 Please Advise/SLS

## 2013-07-04 NOTE — Telephone Encounter (Signed)
Rx request to pharmacy/SLS  

## 2013-07-05 ENCOUNTER — Ambulatory Visit (INDEPENDENT_AMBULATORY_CARE_PROVIDER_SITE_OTHER): Payer: Medicare Other | Admitting: Family Medicine

## 2013-07-05 VITALS — BP 130/72 | HR 57

## 2013-07-05 DIAGNOSIS — R000 Tachycardia, unspecified: Secondary | ICD-10-CM

## 2013-07-05 NOTE — Progress Notes (Deleted)
  Subjective:    Patient ID: Chelsea Hancock, female    DOB: 18-May-1934, 79 y.o.   MRN: [id number]  HPI    Review of Systems     Objective:   Physical Exam        Assessment & Plan:   Patient came in today for a BP check and pulse check. Everything good. BP 130/72 and pulse 57

## 2013-07-11 NOTE — Progress Notes (Signed)
  Subjective:    Patient ID: Chelsea Hancock, female    DOB: 1933/09/26, 79 y.o.   MRN: [id number]  HPI    Review of Systems     Objective:   Physical Exam        Assessment & Plan:  Patient came in on 07-05-13 to get BP and pulse checked

## 2013-07-11 NOTE — Progress Notes (Signed)
Patient ID: Chelsea Hancock, female   DOB: 07/05/1934, 79 y.o.   MRN: [id number] Nurse visit for vital check

## 2013-07-18 ENCOUNTER — Ambulatory Visit (HOSPITAL_COMMUNITY): Payer: Medicare Other | Attending: Cardiology

## 2013-07-18 DIAGNOSIS — Z87891 Personal history of nicotine dependence: Secondary | ICD-10-CM | POA: Insufficient documentation

## 2013-07-18 DIAGNOSIS — R0989 Other specified symptoms and signs involving the circulatory and respiratory systems: Secondary | ICD-10-CM

## 2013-07-18 DIAGNOSIS — I658 Occlusion and stenosis of other precerebral arteries: Secondary | ICD-10-CM | POA: Diagnosis not present

## 2013-07-18 DIAGNOSIS — I6529 Occlusion and stenosis of unspecified carotid artery: Secondary | ICD-10-CM | POA: Insufficient documentation

## 2013-07-18 DIAGNOSIS — E785 Hyperlipidemia, unspecified: Secondary | ICD-10-CM | POA: Diagnosis not present

## 2013-07-22 ENCOUNTER — Telehealth: Payer: Self-pay | Admitting: *Deleted

## 2013-07-22 NOTE — Telephone Encounter (Signed)
Message copied by Burnell Blanks on Fri Jul 22, 2013  5:01 PM ------      Message from: Cassell Clement      Created: Thu Jul 21, 2013  7:18 PM       Please report. The carotid dopplers are very stable.  Recheck in 2 years. ------

## 2013-07-22 NOTE — Telephone Encounter (Signed)
Advised patient

## 2013-07-22 NOTE — Telephone Encounter (Signed)
Patient had doppler

## 2013-07-23 ENCOUNTER — Other Ambulatory Visit: Payer: Self-pay | Admitting: Family Medicine

## 2013-07-29 ENCOUNTER — Other Ambulatory Visit: Payer: Self-pay | Admitting: Family Medicine

## 2013-07-29 DIAGNOSIS — G5600 Carpal tunnel syndrome, unspecified upper limb: Secondary | ICD-10-CM | POA: Diagnosis not present

## 2013-08-01 ENCOUNTER — Other Ambulatory Visit: Payer: Self-pay | Admitting: Family Medicine

## 2013-08-01 NOTE — Telephone Encounter (Signed)
Nortiptyline request.  Last Rx 07/02/13, #60 x no refills. Please advise if ok to proceed and if so, how many refills?

## 2013-08-02 ENCOUNTER — Other Ambulatory Visit: Payer: Self-pay | Admitting: Family Medicine

## 2013-08-02 NOTE — Telephone Encounter (Signed)
Received request for eszopiclone. Rx was faxed to pharmacy on 07/29/13 and I verified receipt by pharmacy. They state pt just picked it up today. Denied current request.

## 2013-08-05 ENCOUNTER — Other Ambulatory Visit: Payer: Self-pay | Admitting: Family Medicine

## 2013-08-09 DIAGNOSIS — IMO0001 Reserved for inherently not codable concepts without codable children: Secondary | ICD-10-CM | POA: Diagnosis not present

## 2013-08-09 DIAGNOSIS — M1990 Unspecified osteoarthritis, unspecified site: Secondary | ICD-10-CM | POA: Diagnosis not present

## 2013-08-09 DIAGNOSIS — M810 Age-related osteoporosis without current pathological fracture: Secondary | ICD-10-CM | POA: Diagnosis not present

## 2013-08-11 ENCOUNTER — Other Ambulatory Visit: Payer: Self-pay | Admitting: Family Medicine

## 2013-08-23 ENCOUNTER — Ambulatory Visit (INDEPENDENT_AMBULATORY_CARE_PROVIDER_SITE_OTHER): Payer: Medicare Other | Admitting: Family

## 2013-08-23 ENCOUNTER — Encounter: Payer: Self-pay | Admitting: Family

## 2013-08-23 ENCOUNTER — Telehealth: Payer: Self-pay | Admitting: *Deleted

## 2013-08-23 VITALS — BP 130/80 | HR 58 | Temp 97.7°F | Resp 16 | Ht 63.0 in | Wt 162.1 lb

## 2013-08-23 DIAGNOSIS — Z23 Encounter for immunization: Secondary | ICD-10-CM

## 2013-08-23 DIAGNOSIS — R946 Abnormal results of thyroid function studies: Secondary | ICD-10-CM | POA: Diagnosis not present

## 2013-08-23 DIAGNOSIS — K529 Noninfective gastroenteritis and colitis, unspecified: Secondary | ICD-10-CM

## 2013-08-23 DIAGNOSIS — I10 Essential (primary) hypertension: Secondary | ICD-10-CM

## 2013-08-23 DIAGNOSIS — K5289 Other specified noninfective gastroenteritis and colitis: Secondary | ICD-10-CM

## 2013-08-23 LAB — RENAL FUNCTION PANEL
Albumin: 4.1 g/dL (ref 3.5–5.2)
BUN: 20 mg/dL (ref 6–23)
CO2: 25 mEq/L (ref 19–32)
Calcium: 9.1 mg/dL (ref 8.4–10.5)
Chloride: 106 mEq/L (ref 96–112)
Creat: 0.77 mg/dL (ref 0.50–1.10)
Glucose, Bld: 89 mg/dL (ref 70–99)
Phosphorus: 2.7 mg/dL (ref 2.3–4.6)
Potassium: 4.3 mEq/L (ref 3.5–5.3)
Sodium: 140 mEq/L (ref 135–145)

## 2013-08-23 LAB — CBC
HCT: 40.7 % (ref 36.0–46.0)
Hemoglobin: 13.7 g/dL (ref 12.0–15.0)
MCH: 30 pg (ref 26.0–34.0)
MCHC: 33.7 g/dL (ref 30.0–36.0)
MCV: 89.3 fL (ref 78.0–100.0)
Platelets: 240 10*3/uL (ref 150–400)
RBC: 4.56 MIL/uL (ref 3.87–5.11)
RDW: 13.7 % (ref 11.5–15.5)
WBC: 5.9 10*3/uL (ref 4.0–10.5)

## 2013-08-23 LAB — T4, FREE: Free T4: 1 ng/dL (ref 0.80–1.80)

## 2013-08-23 LAB — TSH: TSH: 3.639 u[IU]/mL (ref 0.350–4.500)

## 2013-08-23 NOTE — Progress Notes (Signed)
Subjective:    Patient ID: Chelsea Hancock, female    DOB: October 20, 1933, 79 y.o.   MRN: [id number]  HPI  Ms. Chelsea Hancock is a 79 yr old female who presents today with chief complaint of diarrhea. Reports that diarrhea started about 9 days ago.  Denies associated nausea/vomitting. Reports that this past Saturday she had poor appetite.  Reports that diarrhea started every 2 hours the first 4 days.  Then one loose bm in the am.  Had one bm in the evening on Saturday, no bm on Sunday. Yesterday small amount of formed stool. Today stool was "normal" and formed.  Denies associated abdominal pain or fever.  No recent abx.  Review of Systems    see HPI  Past Medical History  Diagnosis Date  . Hyperlipidemia     takes Zetia and Simvastatin nightly  . Palpitation   . Carotid bruit     Right  . Edema   . Arthritis   . PONV (postoperative nausea and vomiting)   . Hyperlipidemia 01/27/2012  . Allergy     portabella mushrooms/diarrhea  . Heart murmur   . Osteoporosis   . Fibromyalgia   . Thrush 03/23/2012  . Vaginitis 03/23/2012  . Sigmoid colon ulcer 03/23/2012  . Migraine 04/27/2012  . Hyperglycemia 04/27/2012  . Dysrhythmia     paroxysmal A fib, irregular heartbeat;takes Bisoprolol nightly  . History of migraine     last about 34yrs ago  . Foot drop, right   . Joint pain   . Joint swelling   . Osteoporosis   . Chronic back pain   . Skin problem in pregnancy     takes gabapentin 3 times a day and also has an ointment  . GERD (gastroesophageal reflux disease)     takes Zantac and Nexium daily  . Hemorrhoids   . History of colon polyps   . Diverticulosis   . IBS (irritable bowel syndrome)     takes align daily  . Cataracts, bilateral   . Hypocalcemia 03/29/2013  . Depression with anxiety 03/29/2013  . Personal history of colonic polyps 06/28/2013  . Tachycardia 07/03/2013  . Onychomycosis 07/03/2013    History   Social History  . Marital Status: Divorced    Spouse Name: N/A   Number of Children: N/A  . Years of Education: N/A   Occupational History  . Not on file.   Social History Main Topics  . Smoking status: Former Smoker -- 4 years    Types: Cigarettes  . Smokeless tobacco: Never Used     Comment: quit 20+yrs ago  . Alcohol Use: 8.4 oz/week    14 Glasses of wine per week     Comment: WINE WITH DINNER. 2 GLASSES RED WINE  . Drug Use: No  . Sexual Activity: No   Other Topics Concern  . Not on file   Social History Narrative  . No narrative on file    Past Surgical History  Procedure Laterality Date  . Abdominal hysterectomy  1981  . Laminectomy  1960  . Veins stripped  1970  . Total hip arthroplasty  2012    Left  . Carpal tunnel release      left, ulnar nerve release at elbow  . Cervical fusion  2003, 2005, 2013  . Back surgery  1997    x 2  . Tonsillectomy  1943  . Joint replacement  2012    left   . Left elbow surgery  06/03/11  .  Colonoscopy    . Posterior cervical fusion/foraminotomy  08/03/2012    Procedure: POSTERIOR CERVICAL FUSION/FORAMINOTOMY LEVEL 5;  Surgeon: Barnett Abu, MD;  Location: [facility];  Service: Neurosurgery;  Laterality: N/A;  Cervical four to Thoracic two Posterior cervical decompression with Facet and Pedicle screw fixation  . Carpal tunnel release Right 07/23/13    Family History  Problem Relation Age of Onset  . Arthritis      family hx  . Cancer      family hx  . Stroke    . Mental illness    . Hyperlipidemia    . Hypertension    . Cancer Mother   . Stomach cancer Mother   . Mental illness Mother     bipolar, anxiety  . Stroke Father   . Colon cancer Father   . Cancer Father   . HIV Son     Aids  . Anesthesia problems Neg Hx   . Stomach cancer Maternal Grandfather   . Stomach cancer Paternal Grandmother     Allergies  Allergen Reactions  . Alendronate Sodium Other (See Comments)    Severe chest pain similar to heart attack   . Cefuroxime Axetil Other (See Comments)    unknown  .  Codeine Nausea And Vomiting  . Macrodantin   . Meperidine Hcl Nausea And Vomiting  . Other Other (See Comments)    Hismanal and Maprobamate  portobello mushrooms - diarrhea  . Sulfonamide Derivatives Hives, Itching and Swelling    Current Outpatient Prescriptions on File Prior to Visit  Medication Sig Dispense Refill  . aspirin EC 81 MG tablet Take 81 mg by mouth daily.      . Biotin 5 MG CAPS Take 1 capsule by mouth daily.        . bisoprolol (ZEBETA) 5 MG tablet TAKE 1 TABLET BY MOUTH TWICE DAILY  60 tablet  0  . Calcium Carbonate-Vitamin D (CALCIUM 600+D) 600-200 MG-UNIT TABS Take 1 tablet by mouth daily after breakfast. Dr. Phylliss Bob       . citalopram (CELEXA) 10 MG tablet TAKE 1 TABLET BY MOUTH EVERY DAY  30 tablet  3  . esomeprazole (NEXIUM) 40 MG capsule Take 40 mg by mouth at bedtime.      . Eszopiclone 3 MG TABS TAKE 1 TABLET BY MOUTH ONCE DAILY AT BEDTIME AS NEEDED  30 tablet  0  . gabapentin (NEURONTIN) 600 MG tablet TAKE 1 TABLET BY MOUTH THREE TIMES DAILY  90 tablet  3  . hydrOXYzine (ATARAX/VISTARIL) 10 MG tablet TAKE 1 TABLET BY MOUTH EVERY 6 HOURS AS NEEDED FOR ITCHING OR ANXIETY  60 tablet  0  . Magnesium 250 MG TABS Take 250 mg by mouth daily. Dr. Phylliss Bob      . Multiple Vitamin (MULITIVITAMIN WITH MINERALS) TABS Take 1 tablet by mouth daily.      . NON FORMULARY Take 1 capsule by mouth daily. Mega Red      . nortriptyline (PAMELOR) 25 MG capsule TAKE 2 CAPSULES BY MOUTH EVERY NIGHT AT BEDTIME  60 capsule  3  . PREMARIN 0.45 MG tablet take 1 tablet by mouth once daily for 21 DAYS THWEN DO NOT TAKE FOR 7 DAYS AS DIRECTED  30 tablet  3  . ranitidine (ZANTAC) 300 MG tablet TAKE 1 TABLET BY MOUTH AT BEDTIME  30 tablet  0  . simvastatin (ZOCOR) 40 MG tablet take 1 tablet by mouth every evening  30 tablet  11  . torsemide (  DEMADEX) 10 MG tablet TAKE 1 TABLET BY MOUTH EVERY MORNING  30 tablet  0  . triamcinolone cream (KENALOG) 0.1 % Apply topically 2 (two) times daily as needed.  pruritus  85.2 g  2  . ZETIA 10 MG tablet TAKE 1 TABLET BY MOUTH DAILY  30 tablet  3  . zoledronic acid (RECLAST) 5 MG/100ML SOLN Inject 5 mg into the vein once.       No current facility-administered medications on file prior to visit.    BP 130/80  Pulse 58  Temp(Src) 97.7 F (36.5 C) (Oral)  Resp 16  Ht 5\' 3"  (1.6 m)  Wt 162 lb 1.3 oz (73.519 kg)  BMI 28.72 kg/m2  SpO2 99%    Objective:   Physical Exam  Constitutional: She is oriented to person, place, and time. She appears well-developed and well-nourished. No distress.  HENT:  Head: Normocephalic and atraumatic.  Cardiovascular: Normal rate and regular rhythm.   No murmur heard. Pulmonary/Chest: Effort normal and breath sounds normal. No respiratory distress. She has no wheezes. She has no rales. She exhibits no tenderness.  Abdominal: Soft. Bowel sounds are normal. She exhibits no distension and no mass. There is no tenderness. There is no rebound and no guarding.  Musculoskeletal: She exhibits no edema.  Neurological: She is alert and oriented to person, place, and time.  Psychiatric: She has a normal mood and affect. Her behavior is normal. Judgment and thought content normal.          Assessment & Plan:

## 2013-08-23 NOTE — Telephone Encounter (Signed)
Needs TSH and free T4 for abnormal thyroid studies, renal and cbc for htn

## 2013-08-23 NOTE — Addendum Note (Signed)
Addended by: Mervin Kung A on: 08/23/2013 12:05 PM   Modules accepted: Orders

## 2013-08-23 NOTE — Patient Instructions (Signed)
Call if you develop recurrent diarrhea, abdominal pain.

## 2013-08-23 NOTE — Telephone Encounter (Signed)
Lab order entered.

## 2013-08-23 NOTE — Telephone Encounter (Signed)
Pt presented to the lab for blood work. I do not see any previous orders for her visit today.  Please advise what tests / dx codes should be given?

## 2013-08-23 NOTE — Assessment & Plan Note (Signed)
Resolved.  Advised pt to call if recurrent symptoms. She would like prevnar today. Reports + pneumovax after age 79.

## 2013-08-25 ENCOUNTER — Encounter: Payer: Self-pay | Admitting: Family

## 2013-08-30 ENCOUNTER — Other Ambulatory Visit: Payer: Self-pay | Admitting: Family Medicine

## 2013-09-05 ENCOUNTER — Other Ambulatory Visit: Payer: Self-pay | Admitting: Family Medicine

## 2013-09-22 HISTORY — PX: EYE SURGERY: SHX253

## 2013-09-30 ENCOUNTER — Other Ambulatory Visit: Payer: Self-pay | Admitting: Family Medicine

## 2013-10-04 ENCOUNTER — Encounter: Payer: Self-pay | Admitting: Nurse Practitioner

## 2013-10-04 ENCOUNTER — Ambulatory Visit (INDEPENDENT_AMBULATORY_CARE_PROVIDER_SITE_OTHER): Payer: Medicare Other | Admitting: Nurse Practitioner

## 2013-10-04 VITALS — BP 116/77 | HR 77 | Temp 98.3°F | Ht 63.0 in | Wt 163.0 lb

## 2013-10-04 DIAGNOSIS — I959 Hypotension, unspecified: Secondary | ICD-10-CM

## 2013-10-04 DIAGNOSIS — R195 Other fecal abnormalities: Secondary | ICD-10-CM | POA: Diagnosis not present

## 2013-10-04 DIAGNOSIS — R197 Diarrhea, unspecified: Secondary | ICD-10-CM | POA: Diagnosis not present

## 2013-10-04 LAB — CBC WITH DIFFERENTIAL/PLATELET
Basophils Absolute: 0 10*3/uL (ref 0.0–0.1)
Basophils Relative: 0.3 % (ref 0.0–3.0)
Eosinophils Absolute: 0.1 10*3/uL (ref 0.0–0.7)
Eosinophils Relative: 1 % (ref 0.0–5.0)
HCT: 40.9 % (ref 36.0–46.0)
Hemoglobin: 13.9 g/dL (ref 12.0–15.0)
Lymphocytes Relative: 31.3 % (ref 12.0–46.0)
Lymphs Abs: 2.9 10*3/uL (ref 0.7–4.0)
MCHC: 33.9 g/dL (ref 30.0–36.0)
MCV: 90.2 fl (ref 78.0–100.0)
Monocytes Absolute: 0.8 10*3/uL (ref 0.1–1.0)
Monocytes Relative: 8.1 % (ref 3.0–12.0)
Neutro Abs: 5.5 10*3/uL (ref 1.4–7.7)
Neutrophils Relative %: 59.3 % (ref 43.0–77.0)
Platelets: 252 10*3/uL (ref 150.0–400.0)
RBC: 4.53 Mil/uL (ref 3.87–5.11)
RDW: 13.4 % (ref 11.5–14.6)
WBC: 9.3 10*3/uL (ref 4.5–10.5)

## 2013-10-04 LAB — COMPREHENSIVE METABOLIC PANEL
ALT: 26 U/L (ref 0–35)
AST: 32 U/L (ref 0–37)
Albumin: 4.1 g/dL (ref 3.5–5.2)
Alkaline Phosphatase: 97 U/L (ref 39–117)
BUN: 24 mg/dL — ABNORMAL HIGH (ref 6–23)
CO2: 29 mEq/L (ref 19–32)
Calcium: 9.5 mg/dL (ref 8.4–10.5)
Chloride: 102 mEq/L (ref 96–112)
Creatinine, Ser: 1 mg/dL (ref 0.4–1.2)
GFR: 55.22 mL/min — ABNORMAL LOW (ref >60.00)
Glucose, Bld: 92 mg/dL (ref 70–99)
Potassium: 4.4 mEq/L (ref 3.5–5.1)
Sodium: 140 mEq/L (ref 135–145)
Total Bilirubin: 0.6 mg/dL (ref 0.3–1.2)
Total Protein: 7 g/dL (ref 6.0–8.3)

## 2013-10-04 LAB — URINALYSIS, ROUTINE W REFLEX MICROSCOPIC
Bilirubin Urine: NEGATIVE
Hgb urine dipstick: NEGATIVE
Ketones, ur: NEGATIVE
Leukocytes, UA: NEGATIVE
Nitrite: NEGATIVE
Specific Gravity, Urine: 1.015 (ref 1.000–1.030)
Total Protein, Urine: NEGATIVE
Urine Glucose: NEGATIVE
Urobilinogen, UA: 0.2 (ref 0.0–1.0)
pH: 5.5 (ref 5.0–8.0)

## 2013-10-04 LAB — SEDIMENTATION RATE: Sed Rate: 24 mm/hr — ABNORMAL HIGH (ref 0–22)

## 2013-10-04 NOTE — Progress Notes (Signed)
Subjective:     Chelsea Hancock is a 80 y.o. female who presents for evaluation of bloody diarrhea. Onset of diarrhea was 14 hours ago. Diarrhea has occurred approximately 4 times since onset. Patient describes diarrhea as black and tarry, bloody and semisolid. Diarrhea has been associated with no symptoms. Patient denies fever, recent antibiotic use, recent travel, significant abdominal pain, unintentional weight loss. Previous visits for diarrhea: 1 mo. Ago, treated as gastroenteritis. Evaluation to date: none.  Treatment to date: none. Last colonoscopy 03/2012 revealed sessile polyp, diverticulosis, ulcer in sigmoid colon, int & ext hemorrhoids. She has long history of back pain & myalgia.   The following portions of the patient's history were reviewed and updated as appropriate: allergies, current medications, past family history, past medical history and problem list.  Review of Systems Constitutional: positive for fatigue, negative for chills, fevers and night sweats Eyes: negative for dryness Ears, nose, mouth, throat, and face: negative, dry mouth secondary to meds Respiratory: negative for cough, sputum and wheezing Cardiovascular: negative, remote Hx of palpitations controlled with bioprolol 5 mg Gastrointestinal: positive for diarrhea and melena, negative for abdominal pain, dyspepsia, nausea and vomiting Integument/breast: negative for rash Musculoskeletal:positive for chronic back pain, fibromyalgia, recent ankle swelling    Objective:    BP 88/48  Pulse 57  Temp(Src) 98.3 F (36.8 C) (Oral)  Ht 5\' 3"  (1.6 m)  Wt 163 lb (73.936 kg)  BMI 28.88 kg/m2  SpO2 94% General: alert, cooperative, appears stated age and no distress  Hydration:  mildly dehydrated  Abdomen:    soft, non-tender; bowel sounds normal; no masses,  no organomegaly    Assessment:  Guiac pos stool  Diarrhea of uncertain etiology, moderate in severity  DD: ulcerative colitis, neoplastic,  diverticulitis Hypotension, recent diarrhea, takes diuretic DD: dehydration, SE beta blocker Plan:  1-2 Stop ASA for now.  Appropriate educational material discussed and distributed. Discussed the appropriate management of diarrhea. Follow up in 1 week or as needed. GI consult. Lab studies per orders. Stool studies per orders.  3 Stop diuretic. Sip fluids every hour. Pt to call if diarrhea gets worse, will do trial of prednisone or mesalamine.

## 2013-10-04 NOTE — Patient Instructions (Addendum)
Please return stool sample. See Dr Fuller Plan. In the meantime, sip fluids every hour to stay hydrated. See instructions below for mixing solution to help stay hydrated and maintain electrolyte balance. Please stop torsemide as your blood pressure is too low. Call us if diarrhea gets worse in next few days. Please remember to schedule follow up with Dr [name]. See you next week.   Diarrhea Diarrhea is frequent loose and watery bowel movements. It can cause you to feel weak and dehydrated. Dehydration can cause you to become tired and thirsty, have a dry mouth, and have decreased urination that often is dark yellow. Diarrhea is a sign of another problem, most often an infection that will not last long. In most cases, diarrhea typically lasts 2 3 days. However, it can last longer if it is a sign of something more serious. It is important to treat your diarrhea as directed by your caregive to lessen or prevent future episodes of diarrhea. CAUSES  Some common causes include:  Gastrointestinal infections caused by viruses, bacteria, or parasites.  Food poisoning or food allergies.  Certain medicines, such as antibiotics, chemotherapy, and laxatives.  Artificial sweeteners and fructose.  Digestive disorders. HOME CARE INSTRUCTIONS  Ensure adequate fluid intake (hydration): have 1 cup (8 oz) of fluid for each diarrhea episode. Avoid fluids that contain simple sugars or sports drinks, fruit juices, whole milk products, and sodas. Your urine should be clear or pale yellow if you are drinking enough fluids. Hydrate with an oral rehydration solution that you can purchase at pharmacies, retail stores, and online. You can prepare an oral rehydration solution at home by mixing the following ingredients together:    tsp table salt.   tsp baking soda.   tsp salt substitute containing potassium chloride.  1  tablespoons sugar.  1 L (34 oz) of water.  Certain foods and beverages may increase the speed at  which food moves through the gastrointestinal (GI) tract. These foods and beverages should be avoided and include:  Caffeinated and alcoholic beverages.  High-fiber foods, such as raw fruits and vegetables, nuts, seeds, and whole grain breads and cereals.  Foods and beverages sweetened with sugar alcohols, such as xylitol, sorbitol, and mannitol.  Some foods may be well tolerated and may help thicken stool including:  Starchy foods, such as rice, toast, pasta, low-sugar cereal, oatmeal, grits, baked potatoes, crackers, and bagels.  Bananas.  Applesauce.  Add probiotic-rich foods to help increase healthy bacteria in the GI tract, such as yogurt and fermented milk products.  Wash your hands well after each diarrhea episode.  Only take over-the-counter or prescription medicines as directed by your caregiver.  Take a warm bath to relieve any burning or pain from frequent diarrhea episodes. SEEK IMMEDIATE MEDICAL CARE IF:   You are unable to keep fluids down.  You have persistent vomiting.  You have blood in your stool, or your stools are black and tarry.  You do not urinate in 6 8 hours, or there is only a small amount of very dark urine.  You have abdominal pain that increases or localizes.  You have weakness, dizziness, confusion, or lightheadedness.  You have a severe headache.  Your diarrhea gets worse or does not get better.  You have a fever or persistent symptoms for more than 2 3 days.  You have a fever and your symptoms suddenly get worse. MAKE SURE YOU:   Understand these instructions.  Will watch your condition.  Will get help right away if  you are not doing well or get worse. Document Released: 08/29/2002 Document Revised: 08/25/2012 Document Reviewed: 05/16/2012 [facility] Patient Information 2014 [place], [place].

## 2013-10-04 NOTE — Progress Notes (Signed)
Pre-visit discussion using our clinic review tool. No additional management support is needed unless otherwise documented below in the visit note.  

## 2013-10-05 ENCOUNTER — Telehealth: Payer: Self-pay | Admitting: Nurse Practitioner

## 2013-10-05 LAB — PAN-ANCA
Atypical p-ANCA Screen: NEGATIVE
Myeloperoxidase Abs: <1 AU/mL (ref <20)
Serine Protease 3: <1 AU/mL (ref <20)
c-ANCA Screen: NEGATIVE
p-ANCA Screen: NEGATIVE

## 2013-10-05 NOTE — Telephone Encounter (Signed)
Spoke w/pt. States no more diarrhea episodes since yesterday. She took immodium twice. Advised to CB if diarrhea recurs-will prescribe mesalamine suppository as sed rate elevated, looks like this may be ulcerative colitis, has hx of sigmoid ulcer. PANCA not resulted yet.  Concerned that GFR has declined in 6 mos from 70's to 50's. She takes a lot of meds. She will sched appt. W/PCP for med review. Will see Dr Fuller Plan in 2 weeks.

## 2013-10-06 ENCOUNTER — Telehealth: Payer: Self-pay | Admitting: Nurse Practitioner

## 2013-10-06 NOTE — Telephone Encounter (Signed)
P-ANCA neg. ESR elevated. Recurrent diarrhea that lasts about 24 hours-pt takes immodium. Last episode was bloody. Has Hx of sigmoid ulcer. DD: UC, hemorrhoid, neoplastic Pt adv to stop ASA & f/u w/Dr StarkuBloody diarrhea. Will see in ofc next wk for re-eval of hypotension- I stopped her diuretic.

## 2013-10-07 ENCOUNTER — Other Ambulatory Visit: Payer: Self-pay | Admitting: Family Medicine

## 2013-10-11 ENCOUNTER — Encounter: Payer: Self-pay | Admitting: Nurse Practitioner

## 2013-10-11 ENCOUNTER — Ambulatory Visit (INDEPENDENT_AMBULATORY_CARE_PROVIDER_SITE_OTHER): Payer: Medicare Other | Admitting: Nurse Practitioner

## 2013-10-11 VITALS — BP 144/74 | HR 54 | Temp 97.6°F | Ht 63.0 in | Wt 168.0 lb

## 2013-10-11 DIAGNOSIS — I959 Hypotension, unspecified: Secondary | ICD-10-CM | POA: Diagnosis not present

## 2013-10-11 DIAGNOSIS — R197 Diarrhea, unspecified: Secondary | ICD-10-CM

## 2013-10-11 NOTE — Patient Instructions (Signed)
Restart demadex and daily aspirin. Keep appointment with Dr Fuller Plan. Glad you are feeling better!

## 2013-10-11 NOTE — Progress Notes (Signed)
Subjective:     MELISSIA LAHMAN is a 80 y.o. female who presents for follow up of bloody diarrhea with hypotension. Pt denies additional episodes of diarrhea since last visit. She took immodium with relief of frequent loose stools. She has noticed swollen hands & feet since she stopped diuretic last week. She has history of recurrent episodes of diarrhea, but without blood until last week. Lab work up revealed nml hgb, neg PANCA, elevated sed rate, GFR in 50's, o/w nml kidney & liver func., POS fecal occult blood. Ova & parasite test not returned. Pt has hx of sigmoid ulcer.   The following portions of the patient's history were reviewed and updated as appropriate: allergies, current medications, past medical history, past social history and problem list.  Review of Systems Constitutional: negative for chills, fatigue, fevers and night sweats Cardiovascular: positive for lower extremity edema and feels like hands & face have been swollen since stopped demadex, negative for chest pain, chest pressure/discomfort and palpitations Gastrointestinal: negative for diarrhea and feels like back to normal bowel routine    Objective:    BP 144/74  Pulse 54  Temp(Src) 97.6 F (36.4 C) (Temporal)  Ht 5\' 3"  (1.6 m)  Wt 168 lb (76.204 kg)  BMI 29.77 kg/m2  SpO2 94% General: alert, cooperative, appears stated age and no distress  Hydration:  well hydrated  Abdomen:    soft, non-tender; bowel sounds normal; no masses,  no organomegaly   Extremities: no LE edema, no swelling of hands. Face does not appear swollen. Assessment:    Bloody diarrhea 1 week ago. resolved. elevated sed rate, neg Panca. Pos occult fecal blood.  Hypotension, last visit. sys bp 86-likely r/t diarrhea. Stopped diuretic. Today pressure 144/74.  Plan:    ref to GI, has appt next week.   Re-start demadex.

## 2013-10-11 NOTE — Progress Notes (Signed)
Pre-visit discussion using our clinic review tool. No additional management support is needed unless otherwise documented below in the visit note.  

## 2013-10-17 ENCOUNTER — Encounter: Payer: Self-pay | Admitting: Gastroenterology

## 2013-10-17 ENCOUNTER — Other Ambulatory Visit: Payer: Self-pay | Admitting: Family Medicine

## 2013-10-17 ENCOUNTER — Ambulatory Visit (INDEPENDENT_AMBULATORY_CARE_PROVIDER_SITE_OTHER): Payer: Medicare Other | Admitting: Gastroenterology

## 2013-10-17 VITALS — BP 110/60 | HR 60 | Ht 63.0 in | Wt 164.0 lb

## 2013-10-17 DIAGNOSIS — R195 Other fecal abnormalities: Secondary | ICD-10-CM

## 2013-10-17 DIAGNOSIS — K921 Melena: Secondary | ICD-10-CM | POA: Diagnosis not present

## 2013-10-17 DIAGNOSIS — R198 Other specified symptoms and signs involving the digestive system and abdomen: Secondary | ICD-10-CM

## 2013-10-17 MED ORDER — HYOSCYAMINE SULFATE 0.125 MG SL SUBL: SUBLINGUAL_TABLET | SUBLINGUAL | Status: DC

## 2013-10-17 MED ORDER — ESOMEPRAZOLE MAGNESIUM 40 MG PO CPDR: 40.0000 mg | DELAYED_RELEASE_CAPSULE | Freq: Every morning | ORAL | Status: DC

## 2013-10-17 NOTE — Patient Instructions (Signed)
Take your Nexium in the morning 30 minutes before breakfast.   We have sent the following medications to your pharmacy for you to pick up at your convenience: [place].  Thank you for choosing me and [facility].  Pricilla Riffle. Dagoberto Ligas., MD., [facility] Regal

## 2013-10-17 NOTE — Progress Notes (Signed)
    History of Present Illness: This is a 80 year old female with multiple comorbidities, on multiple medications who has had 5 or 6 episodes of urgent bowel movements at night since October. These typically happened between 1-4 am. She has a history of irritable bowel syndrome and occasionally will have urgent stools that are soft. She's not had any change in her diet or medications. She typically takes nortriptyline, hydroxyzine, Nexium and Lunesta at bedtime. She noted a small amount of bleeding with one episode of urgent stools. Her stool was noted to be occult blood positive at her PCPs office. Patient underwent colonoscopy in June 2013 showing a nonspecific sigmoid colon ulcer (biopsies benign) diverticulosis and small polyp (adenomatous) and internal and external hemorrhoids.  Current Medications, Allergies, Past Medical History, Past Surgical History, Family History and Social History were reviewed in [employer] record.  Physical Exam: General: Well developed , well nourished, no acute distress Head: Normocephalic and atraumatic Eyes:  sclerae anicteric, EOMI Ears: Normal auditory acuity Mouth: No deformity or lesions Lungs: Clear throughout to auscultation Heart: Regular rate and rhythm; no murmurs, rubs or bruits Abdomen: Soft, non tender and non distended. No masses, hepatosplenomegaly or hernias noted. Normal Bowel sounds Musculoskeletal: Symmetrical with no gross deformities  Pulses:  Normal pulses noted Extremities: No clubbing, cyanosis, edema or deformities noted Neurological: Alert oriented x 4, grossly nonfocal Psychological:  Alert and cooperative. Normal mood and affect  Assessment and Recommendations:  1. Occasional urgent bowel movements at night. Etiology is not clear. Change Nexium to qam. Advised not eat for at least 4 hours before going to bed. I've asked her to look for any foods she is eating during the day or evening that may correlate with  her urgent stools at night. Levsin 1-2 every 4 hours as needed. She's advised to call if her symptoms worsen.  2. Small-volume hematochezia and Hemoccult-positive stool. Colonoscopy in June 2013 showing internal and external hemorrhoids which are the likely source of her bleeding. No need to repeat colonoscopy at this time.  3. Personal history of adenomatous colon polyps. No plans for repeat surveillance colonoscopy due to age and comorbidities.

## 2013-10-26 ENCOUNTER — Other Ambulatory Visit: Payer: Self-pay | Admitting: Family Medicine

## 2013-11-02 ENCOUNTER — Telehealth: Payer: Self-pay

## 2013-11-02 NOTE — Telephone Encounter (Signed)
She can take 1/2 tab every day x 2 weeks then 1/2 tab every other day x 2 weeks then stop. She may be fine stopping or she may have some hot flashes but they should resolve in a couple of weeks.

## 2013-11-02 NOTE — Telephone Encounter (Signed)
Per MD inform pt that insurance is denying all premarin right now. I informed the pt per md that if she agrees the benefit is worth the risk of cancer/blood clots etc..they might pay for it or pt can stop.  Pt would like to know if she should taper off and what will happen if she stops?

## 2013-11-04 NOTE — Telephone Encounter (Signed)
Notified pt and mychart message was sent previously.

## 2013-11-20 ENCOUNTER — Other Ambulatory Visit: Payer: Self-pay | Admitting: Family Medicine

## 2013-11-21 DIAGNOSIS — J209 Acute bronchitis, unspecified: Secondary | ICD-10-CM | POA: Diagnosis not present

## 2013-11-21 DIAGNOSIS — J029 Acute pharyngitis, unspecified: Secondary | ICD-10-CM | POA: Diagnosis not present

## 2013-12-15 DIAGNOSIS — Z1231 Encounter for screening mammogram for malignant neoplasm of breast: Secondary | ICD-10-CM | POA: Diagnosis not present

## 2013-12-15 LAB — HM MAMMOGRAPHY

## 2013-12-16 ENCOUNTER — Other Ambulatory Visit: Payer: Self-pay | Admitting: Family Medicine

## 2013-12-19 ENCOUNTER — Other Ambulatory Visit: Payer: Self-pay | Admitting: Family Medicine

## 2013-12-19 ENCOUNTER — Encounter: Payer: Self-pay | Admitting: Family Medicine

## 2013-12-19 NOTE — Telephone Encounter (Signed)
Zetia refill sent to pharmacy, #30 x 2 refills. Pt last seen by you in 06/2013.  When should pt have routine follow up as she has no appointments on file?

## 2013-12-19 NOTE — Telephone Encounter (Signed)
Should should have an appt roughly every 6 months, so should have an appt in next 2 months for follow up and labs

## 2013-12-20 DIAGNOSIS — H5200 Hypermetropia, unspecified eye: Secondary | ICD-10-CM | POA: Diagnosis not present

## 2013-12-20 DIAGNOSIS — L819 Disorder of pigmentation, unspecified: Secondary | ICD-10-CM | POA: Diagnosis not present

## 2013-12-20 DIAGNOSIS — L905 Scar conditions and fibrosis of skin: Secondary | ICD-10-CM | POA: Diagnosis not present

## 2013-12-20 DIAGNOSIS — H52229 Regular astigmatism, unspecified eye: Secondary | ICD-10-CM | POA: Diagnosis not present

## 2013-12-20 DIAGNOSIS — H2589 Other age-related cataract: Secondary | ICD-10-CM | POA: Diagnosis not present

## 2013-12-20 DIAGNOSIS — H524 Presbyopia: Secondary | ICD-10-CM | POA: Diagnosis not present

## 2013-12-20 DIAGNOSIS — L821 Other seborrheic keratosis: Secondary | ICD-10-CM | POA: Diagnosis not present

## 2013-12-20 NOTE — Telephone Encounter (Signed)
Please call pt to arrange appt as indicated below.

## 2013-12-21 DIAGNOSIS — R928 Other abnormal and inconclusive findings on diagnostic imaging of breast: Secondary | ICD-10-CM | POA: Diagnosis not present

## 2013-12-21 NOTE — Telephone Encounter (Signed)
Left message with husband for patient to return my call

## 2013-12-22 NOTE — Telephone Encounter (Signed)
Left message for patient to return my call.

## 2013-12-26 NOTE — Telephone Encounter (Signed)
Left message for patient to return my call.

## 2013-12-30 ENCOUNTER — Other Ambulatory Visit: Payer: Self-pay | Admitting: Family Medicine

## 2014-01-02 NOTE — Telephone Encounter (Signed)
rx faxed

## 2014-01-06 ENCOUNTER — Encounter: Payer: Self-pay | Admitting: Family Medicine

## 2014-01-11 ENCOUNTER — Encounter: Payer: Self-pay | Admitting: Family Medicine

## 2014-01-15 ENCOUNTER — Other Ambulatory Visit: Payer: Self-pay | Admitting: Family

## 2014-01-16 NOTE — Telephone Encounter (Signed)
Rx request to pharmacy/SLS  

## 2014-01-24 ENCOUNTER — Ambulatory Visit (HOSPITAL_BASED_OUTPATIENT_CLINIC_OR_DEPARTMENT_OTHER)
Admission: RE | Admit: 2014-01-24 | Discharge: 2014-01-24 | Disposition: A | Discharge status: Home / Self Care | Payer: Medicare Other | Source: Ambulatory Visit | Attending: Family | Admitting: Family

## 2014-01-24 ENCOUNTER — Ambulatory Visit (INDEPENDENT_AMBULATORY_CARE_PROVIDER_SITE_OTHER): Payer: Medicare Other | Admitting: Family

## 2014-01-24 ENCOUNTER — Encounter: Payer: Self-pay | Admitting: Family

## 2014-01-24 VITALS — BP 110/80 | HR 120 | Temp 98.6°F | Resp 16 | Ht 63.0 in | Wt 166.0 lb

## 2014-01-24 DIAGNOSIS — R05 Cough: Secondary | ICD-10-CM | POA: Insufficient documentation

## 2014-01-24 DIAGNOSIS — R232 Flushing: Secondary | ICD-10-CM | POA: Insufficient documentation

## 2014-01-24 DIAGNOSIS — N951 Menopausal and female climacteric states: Secondary | ICD-10-CM | POA: Diagnosis not present

## 2014-01-24 DIAGNOSIS — R059 Cough, unspecified: Secondary | ICD-10-CM | POA: Diagnosis not present

## 2014-01-24 MED ORDER — BENZONATATE 100 MG PO CAPS: 100.0000 mg | ORAL_CAPSULE | Freq: Three times a day (TID) | ORAL | Status: DC | PRN

## 2014-01-24 NOTE — Assessment & Plan Note (Signed)
Due to recent discontinuation of estrogen. Advised pt that at her age it is advisable to try to stay off of estrogen and that hot flashes should continue to improve over the coming months.

## 2014-01-24 NOTE — Assessment & Plan Note (Signed)
Likely secondary to allergies. Advised the following:  Start claritin and flonase (both are otc). Complete your chest x ray on the first floor. Call if symptoms worsen or if not improved in 1 week.

## 2014-01-24 NOTE — Patient Instructions (Signed)
Start claritin and flonase (both are otc). Complete your chest x ray on the first floor. Call if symptoms worsen or if not improved in 1 week.

## 2014-01-24 NOTE — Progress Notes (Signed)
Pre visit review using our clinic review tool, if applicable. No additional management support is needed unless otherwise documented below in the visit note. 

## 2014-01-24 NOTE — Progress Notes (Signed)
Subjective:    Patient ID: Chelsea Hancock, female    DOB: 07-10-1934, 80 y.o.   MRN: [id number]  HPI  [name] is a 80 yr old female who presents today with two concerns:  1) cough-  Has been present x 1 month. Has associated sinus drainage. Went to an urgent care and was given abx the first week. Symptoms improved for a few days. She reports clear sinus drainage. Cough is productive of tan sputum.  She denies fever.  Energy is fair.    2) Menopause- pt reports that she stopped estrogen and has been having several hot flashes a day.   Review of Systems See HPI  Past Medical History  Diagnosis Date  . Hyperlipidemia     takes Zetia and Simvastatin nightly  . Palpitation   . Carotid bruit     Right  . Edema   . Arthritis   . PONV (postoperative nausea and vomiting)   . Hyperlipidemia 01/27/2012  . Allergy     portabella mushrooms/diarrhea  . Heart murmur   . Osteoporosis   . Fibromyalgia   . Thrush 03/23/2012  . Vaginitis 03/23/2012  . Sigmoid colon ulcer 03/23/2012  . Migraine 04/27/2012  . Hyperglycemia 04/27/2012  . Dysrhythmia     paroxysmal A fib, irregular heartbeat;takes Bisoprolol nightly  . History of migraine     last about 69yrs ago  . Foot drop, right   . Joint pain   . Joint swelling   . Osteoporosis   . Chronic back pain   . Skin problem in pregnancy     takes gabapentin 3 times a day and also has an ointment  . GERD (gastroesophageal reflux disease)     takes Zantac and Nexium daily  . Hemorrhoids   . Diverticulosis   . IBS (irritable bowel syndrome)     takes align daily  . Cataracts, bilateral   . Hypocalcemia 03/29/2013  . Depression with anxiety 03/29/2013  . Tachycardia 07/03/2013  . Onychomycosis 07/03/2013  . Adenomatous polyp of colon 1991, 2008    History   Social History  . Marital Status: Divorced    Spouse Name: N/A    Number of Children: N/A  . Years of Education: N/A   Occupational History  . Not on file.   Social  History Main Topics  . Smoking status: Former Smoker -- 4 years    Types: Cigarettes  . Smokeless tobacco: Never Used     Comment: quit 20+yrs ago  . Alcohol Use: 8.4 oz/week    14 Glasses of wine per week     Comment: WINE WITH DINNER. 2 GLASSES RED WINE  . Drug Use: No  . Sexual Activity: No   Other Topics Concern  . Not on file   Social History Narrative  . No narrative on file    Past Surgical History  Procedure Laterality Date  . Abdominal hysterectomy  1981  . Lumbar laminectomy  1960    x 2  . Veins stripped  1970  . Total hip arthroplasty Left 2012  . Carpal tunnel release Left     ulnar nerve release at elbow  . Cervical fusion  2003, 2005  . Back surgery  1997  . Tonsillectomy  1943  . Left elbow surgery  06/03/11  . Colonoscopy    . Posterior cervical fusion/foraminotomy  08/03/2012    Procedure: POSTERIOR CERVICAL FUSION/FORAMINOTOMY LEVEL 5;  Surgeon: [name], MD;  Location: [facility];  Service: Neurosurgery;  Laterality: N/A;  Cervical four to Thoracic two Posterior cervical decompression with Facet and Pedicle screw fixation  . Carpal tunnel release Right 07/23/13    Family History  Problem Relation Age of Onset  . Bipolar disorder Mother   . Stomach cancer Mother   . Mental illness Mother   . Stroke Father   . Colon cancer Father   . Throat cancer Maternal Uncle     larynx  . HIV Son     Aids  . Anesthesia problems Neg Hx   . Stomach cancer Maternal Grandfather   . Stomach cancer Paternal Grandmother   . Anxiety disorder Mother   . Diabetes Neg Hx   . Heart disease Maternal Grandmother     Allergies  Allergen Reactions  . Alendronate Sodium Other (See Comments)    Severe chest pain similar to heart attack   . Cefuroxime Axetil Other (See Comments)    unknown  . Codeine Nausea And Vomiting  . Macrodantin   . Meperidine Hcl Nausea And Vomiting  . Other Other (See Comments)    Hismanal and Maprobamate  portobello mushrooms -  diarrhea  . Sulfonamide Derivatives Hives, Itching and Swelling    Current Outpatient Prescriptions on File Prior to Visit  Medication Sig Dispense Refill  . aspirin EC 81 MG tablet Take 81 mg by mouth daily.      . Biotin 5 MG CAPS Take 1 capsule by mouth daily.        . bisoprolol (ZEBETA) 5 MG tablet TAKE 1 TABLET BY MOUTH TWICE DAILY  60 tablet  0  . Calcium Carbonate-Vitamin D (CALCIUM 600+D) 600-200 MG-UNIT TABS Take 1 tablet by mouth daily after breakfast. Dr. Justine Null       . citalopram (CELEXA) 10 MG tablet TAKE 1 TABLET BY MOUTH EVERY DAY  30 tablet  3  . esomeprazole (NEXIUM) 40 MG capsule Take 1 capsule (40 mg total) by mouth every morning.  30 capsule  11  . Eszopiclone 3 MG TABS TAKE 1 TABLET BY MOUTH EVERY DAY AT BEDTIME AS NEEDED  30 tablet  3  . gabapentin (NEURONTIN) 600 MG tablet TAKE 1 TABLET BY MOUTH THREE TIMES DAILY  90 tablet  3  . hydrOXYzine (ATARAX/VISTARIL) 10 MG tablet TAKE 1 TABLET BY MOUTH EVERY 6 HOURS AS NEEDED FOR ITCHING OR ANXIETY  60 tablet  2  . hyoscyamine (LEVSIN SL) 0.125 MG SL tablet Take 1-2 tablets by mouth every 4 hours as needed for bloating and diarrhea  30 tablet  0  . Magnesium 250 MG TABS Take 250 mg by mouth daily. Dr. Justine Null      . Multiple Vitamin (MULITIVITAMIN WITH MINERALS) TABS Take 1 tablet by mouth daily.      . NON FORMULARY Take 1 capsule by mouth daily. Mega Red      . nortriptyline (PAMELOR) 25 MG capsule TAKE 2 CAPSULES BY MOUTH EVERY NIGHT AT BEDTIME  60 capsule  2  . ranitidine (ZANTAC) 300 MG tablet TAKE 1 TABLET BY MOUTH AT BEDTIME  30 tablet  3  . simvastatin (ZOCOR) 40 MG tablet take 1 tablet by mouth every evening  30 tablet  11  . torsemide (DEMADEX) 10 MG tablet TAKE 1 TABLET BY MOUTH EVERY MORNING  30 tablet  3  . triamcinolone cream (KENALOG) 0.1 % Apply topically 2 (two) times daily as needed. pruritus  85.2 g  2  . ZETIA 10 MG tablet TAKE 1 TABLET BY MOUTH  DAILY  30 tablet  2  . zoledronic acid (RECLAST) 5 MG/100ML SOLN  Inject 5 mg into the vein once.      Marland Kitchen PREMARIN 0.45 MG tablet TAKE 1 TABLET BY MOUTH ONCE DAILY FOR 21 DAYS, THEN DO NOT TAKE FOR 7 DAYS AS DIRECTED  60 tablet  0   No current facility-administered medications on file prior to visit.    BP 110/80  Pulse 120  Temp(Src) 98.6 F (37 C) (Oral)  Resp 16  Ht 5\' 3"  (1.6 m)  Wt 166 lb (75.297 kg)  BMI 29.41 kg/m2  SpO2 96%       Objective:   Physical Exam  Constitutional: She is oriented to person, place, and time. She appears well-developed and well-nourished. No distress.  HENT:  Head: Normocephalic and atraumatic.  Cardiovascular: Normal rate and regular rhythm.   No murmur heard. Pulmonary/Chest: Effort normal and breath sounds normal. No respiratory distress. She has no wheezes. She has no rales. She exhibits no tenderness.  Musculoskeletal: She exhibits no edema.  Neurological: She is alert and oriented to person, place, and time.  Psychiatric: She has a normal mood and affect. Her behavior is normal. Judgment and thought content normal.          Assessment & Plan:

## 2014-01-26 DIAGNOSIS — H3509 Other intraretinal microvascular abnormalities: Secondary | ICD-10-CM | POA: Diagnosis not present

## 2014-01-26 DIAGNOSIS — H2510 Age-related nuclear cataract, unspecified eye: Secondary | ICD-10-CM | POA: Diagnosis not present

## 2014-01-26 DIAGNOSIS — H40019 Open angle with borderline findings, low risk, unspecified eye: Secondary | ICD-10-CM | POA: Diagnosis not present

## 2014-01-26 DIAGNOSIS — H25019 Cortical age-related cataract, unspecified eye: Secondary | ICD-10-CM | POA: Diagnosis not present

## 2014-01-30 ENCOUNTER — Other Ambulatory Visit: Payer: Self-pay | Admitting: Family Medicine

## 2014-02-10 ENCOUNTER — Ambulatory Visit (INDEPENDENT_AMBULATORY_CARE_PROVIDER_SITE_OTHER): Payer: Medicare Other | Admitting: Family Medicine

## 2014-02-10 VITALS — BP 126/82 | HR 18 | Temp 98.9°F | Resp 18 | Ht 63.0 in | Wt 163.0 lb

## 2014-02-10 DIAGNOSIS — N951 Menopausal and female climacteric states: Secondary | ICD-10-CM

## 2014-02-10 DIAGNOSIS — M899 Disorder of bone, unspecified: Secondary | ICD-10-CM | POA: Diagnosis not present

## 2014-02-10 DIAGNOSIS — M949 Disorder of cartilage, unspecified: Secondary | ICD-10-CM

## 2014-02-10 DIAGNOSIS — M8580 Other specified disorders of bone density and structure, unspecified site: Secondary | ICD-10-CM

## 2014-02-10 DIAGNOSIS — E039 Hypothyroidism, unspecified: Secondary | ICD-10-CM | POA: Diagnosis not present

## 2014-02-10 DIAGNOSIS — K219 Gastro-esophageal reflux disease without esophagitis: Secondary | ICD-10-CM | POA: Diagnosis not present

## 2014-02-10 DIAGNOSIS — R000 Tachycardia, unspecified: Secondary | ICD-10-CM

## 2014-02-10 DIAGNOSIS — E785 Hyperlipidemia, unspecified: Secondary | ICD-10-CM | POA: Diagnosis not present

## 2014-02-10 DIAGNOSIS — R232 Flushing: Secondary | ICD-10-CM

## 2014-02-10 LAB — CBC
HCT: 41.6 % (ref 36.0–46.0)
Hemoglobin: 14 g/dL (ref 12.0–15.0)
MCH: 30.2 pg (ref 26.0–34.0)
MCHC: 33.7 g/dL (ref 30.0–36.0)
MCV: 89.8 fL (ref 78.0–100.0)
Platelets: 253 10*3/uL (ref 150–400)
RBC: 4.63 MIL/uL (ref 3.87–5.11)
RDW: 13.8 % (ref 11.5–15.5)
WBC: 5.1 10*3/uL (ref 4.0–10.5)

## 2014-02-10 NOTE — Progress Notes (Signed)
Pre visit review using our clinic review tool, if applicable. No additional management support is needed unless otherwise documented below in the visit note. 

## 2014-02-10 NOTE — Patient Instructions (Signed)
Try ICool and eat regularly with complex carbs and lean proteins. Regular exercise and good sleep   Cholesterol Cholesterol is a white, waxy, fat-like protein needed by your body in small amounts. The liver makes all the cholesterol you need. It is carried from the liver by the blood through the blood vessels. Deposits (plaque) may build up on blood vessel walls. This makes the arteries narrower and stiffer. Plaque increases the risk for heart attack and stroke. You cannot feel your cholesterol level even if it is very high. The only way to know is by a blood test to check your lipid (fats) levels. Once you know your cholesterol levels, you should keep a record of the test results. Work with your caregiver to to keep your levels in the desired range. WHAT THE RESULTS MEAN:  Total cholesterol is a rough measure of all the cholesterol in your blood.  LDL is the so-called bad cholesterol. This is the type that deposits cholesterol in the walls of the arteries. You want this level to be low.  HDL is the good cholesterol because it cleans the arteries and carries the LDL away. You want this level to be high.  Triglycerides are fat that the body can either burn for energy or store. High levels are closely linked to heart disease. DESIRED LEVELS:  Total cholesterol below 200.  LDL below 100 for people at risk, below 70 for very high risk.  HDL above 50 is good, above 60 is best.  Triglycerides below 150. HOW TO LOWER YOUR CHOLESTEROL:  Diet.  Choose fish or white meat chicken and [place], roasted or baked. Limit fatty cuts of red meat, fried foods, and processed meats, such as sausage and lunch meat.  Eat lots of fresh fruits and vegetables. Choose whole grains, beans, pasta, potatoes and cereals.  Use only small amounts of olive, corn or canola oils. Avoid butter, mayonnaise, shortening or palm kernel oils. Avoid foods with trans-fats.  Use skim/nonfat milk and low-fat/nonfat yogurt and  cheeses. Avoid whole milk, cream, ice cream, egg yolks and cheeses. Healthy desserts include angel food cake, ginger snaps, animal crackers, hard candy, popsicles, and low-fat/nonfat frozen yogurt. Avoid pastries, cakes, pies and cookies.  Exercise.  A regular program helps decrease LDL and raises HDL.  Helps with weight control.  Do things that increase your activity level like gardening, walking, or taking the stairs.  Medication.  May be prescribed by your caregiver to help lowering cholesterol and the risk for heart disease.  You may need medicine even if your levels are normal if you have several risk factors. HOME CARE INSTRUCTIONS   Follow your diet and exercise programs as suggested by your caregiver.  Take medications as directed.  Have blood work done when your caregiver feels it is necessary. MAKE SURE YOU:   Understand these instructions.  Will watch your condition.  Will get help right away if you are not doing well or get worse. Document Released: 06/03/2001 Document Revised: 12/01/2011 Document Reviewed: 06/22/2013 [facility] Patient Information 2014 [place], [place].

## 2014-02-11 LAB — RENAL FUNCTION PANEL
Albumin: 4.5 g/dL (ref 3.5–5.2)
BUN: 17 mg/dL (ref 6–23)
CO2: 28 mEq/L (ref 19–32)
Calcium: 9.1 mg/dL (ref 8.4–10.5)
Chloride: 103 mEq/L (ref 96–112)
Creat: 0.85 mg/dL (ref 0.50–1.10)
Glucose, Bld: 92 mg/dL (ref 70–99)
Phosphorus: 3.4 mg/dL (ref 2.3–4.6)
Potassium: 4.2 mEq/L (ref 3.5–5.3)
Sodium: 140 mEq/L (ref 135–145)

## 2014-02-11 LAB — HEPATIC FUNCTION PANEL
ALT: 31 U/L (ref 0–35)
AST: 38 U/L — ABNORMAL HIGH (ref 0–37)
Albumin: 4.5 g/dL (ref 3.5–5.2)
Alkaline Phosphatase: 99 U/L (ref 39–117)
Bilirubin, Direct: 0.1 mg/dL (ref 0.0–0.3)
Indirect Bilirubin: 0.4 mg/dL (ref 0.2–1.2)
Total Bilirubin: 0.5 mg/dL (ref 0.2–1.2)
Total Protein: 6.9 g/dL (ref 6.0–8.3)

## 2014-02-11 LAB — LIPID PANEL
Cholesterol: 170 mg/dL (ref 0–200)
HDL: 53 mg/dL (ref >39)
LDL Cholesterol: 94 mg/dL (ref 0–99)
Total CHOL/HDL Ratio: 3.2 Ratio
Triglycerides: 114 mg/dL (ref <150)
VLDL: 23 mg/dL (ref 0–40)

## 2014-02-11 LAB — TSH: TSH: 2.514 u[IU]/mL (ref 0.350–4.500)

## 2014-02-11 LAB — VITAMIN D 25 HYDROXY (VIT D DEFICIENCY, FRACTURES): Vit D, 25-Hydroxy: 57 ng/mL (ref 30–89)

## 2014-02-13 ENCOUNTER — Encounter: Payer: Self-pay | Admitting: Family Medicine

## 2014-02-13 NOTE — Assessment & Plan Note (Signed)
resolved 

## 2014-02-13 NOTE — Progress Notes (Signed)
Patient ID: Chelsea Hancock, female   DOB: [birth year], 80 y.o.   MRN: [id number] Chelsea Hancock [id number] [birth year] 02/13/2014      Progress Note-Follow Up  Subjective  Chief Complaint  Chief Complaint  Patient presents with  . Follow-up    no concerns    HPI  Patient is a 80 year old female in today for routine medical care. He is in today for followup. Has generally been doing well although she does continue to struggle with insomnia. Has show falling asleep and staying asleep. Complaints of increased gaseousness in her abdomen as well but denies any other changes in bowel habits. No bloody or tarry stool. No fevers or chills. No constipation or diarrhea. Does continue to have some back spasms occasionally nothing debilitating. Denies CP/palp/SOB/HA/congestion/fevers/GI or GU c/o. Taking meds as prescribed  Past Medical History  Diagnosis Date  . Hyperlipidemia     takes Zetia and Simvastatin nightly  . Palpitation   . Carotid bruit     Right  . Edema   . Arthritis   . PONV (postoperative nausea and vomiting)   . Hyperlipidemia 01/27/2012  . Allergy     portabella mushrooms/diarrhea  . Heart murmur   . Osteoporosis   . Fibromyalgia   . Thrush 03/23/2012  . Vaginitis 03/23/2012  . Sigmoid colon ulcer 03/23/2012  . Migraine 04/27/2012  . Hyperglycemia 04/27/2012  . Dysrhythmia     paroxysmal A fib, irregular heartbeat;takes Bisoprolol nightly  . History of migraine     last about 17yrs ago  . Foot drop, right   . Joint pain   . Joint swelling   . Osteoporosis   . Chronic back pain   . Skin problem in pregnancy     takes gabapentin 3 times a day and also has an ointment  . GERD (gastroesophageal reflux disease)     takes Zantac and Nexium daily  . Hemorrhoids   . Diverticulosis   . IBS (irritable bowel syndrome)     takes align daily  . Cataracts, bilateral   . Hypocalcemia 03/29/2013  . Depression with anxiety 03/29/2013  . Tachycardia 07/03/2013  .  Onychomycosis 07/03/2013  . Adenomatous polyp of colon 1991, 2008    Past Surgical History  Procedure Laterality Date  . Abdominal hysterectomy  1981  . Lumbar laminectomy  1960    x 2  . Veins stripped  1970  . Total hip arthroplasty Left 2012  . Carpal tunnel release Left     ulnar nerve release at elbow  . Cervical fusion  2003, 2005  . Back surgery  1997  . Tonsillectomy  1943  . Left elbow surgery  06/03/11  . Colonoscopy    . Posterior cervical fusion/foraminotomy  08/03/2012    Procedure: POSTERIOR CERVICAL FUSION/FORAMINOTOMY LEVEL 5;  Surgeon: [name], MD;  Location: [facility];  Service: Neurosurgery;  Laterality: N/A;  Cervical four to Thoracic two Posterior cervical decompression with Facet and Pedicle screw fixation  . Carpal tunnel release Right 07/23/13    Family History  Problem Relation Age of Onset  . Bipolar disorder Mother   . Stomach cancer Mother   . Mental illness Mother   . Stroke Father   . Colon cancer Father   . Throat cancer Maternal Uncle     larynx  . HIV Son     Aids  . Anesthesia problems Neg Hx   . Stomach cancer Maternal Grandfather   . Stomach cancer Paternal Grandmother   .  Anxiety disorder Mother   . Diabetes Neg Hx   . Heart disease Maternal Grandmother     History   Social History  . Marital Status: Divorced    Spouse Name: N/A    Number of Children: N/A  . Years of Education: N/A   Occupational History  . Not on file.   Social History Main Topics  . Smoking status: Former Smoker -- 4 years    Types: Cigarettes  . Smokeless tobacco: Never Used     Comment: quit 20+yrs ago  . Alcohol Use: 8.4 oz/week    14 Glasses of wine per week     Comment: WINE WITH DINNER. 2 GLASSES RED WINE  . Drug Use: No  . Sexual Activity: No   Other Topics Concern  . Not on file   Social History Narrative  . No narrative on file    Current Outpatient Prescriptions on File Prior to Visit  Medication Sig Dispense Refill  .  aspirin EC 81 MG tablet Take 81 mg by mouth daily.      . Biotin 5 MG CAPS Take 1 capsule by mouth daily.        . bisoprolol (ZEBETA) 5 MG tablet TAKE 1 TABLET BY MOUTH TWICE DAILY  60 tablet  0  . Calcium Carbonate-Vitamin D (CALCIUM 600+D) 600-200 MG-UNIT TABS Take 1 tablet by mouth daily after breakfast. Dr. Justine Null       . citalopram (CELEXA) 10 MG tablet TAKE 1 TABLET BY MOUTH EVERY DAY  30 tablet  3  . esomeprazole (NEXIUM) 40 MG capsule Take 1 capsule (40 mg total) by mouth every morning.  30 capsule  11  . Eszopiclone 3 MG TABS TAKE 1 TABLET BY MOUTH EVERY DAY AT BEDTIME AS NEEDED  30 tablet  3  . fluticasone (FLONASE) 50 MCG/ACT nasal spray Place 2 sprays into both nostrils daily.      Marland Kitchen gabapentin (NEURONTIN) 600 MG tablet TAKE 1 TABLET BY MOUTH THREE TIMES DAILY  90 tablet  3  . hydrOXYzine (ATARAX/VISTARIL) 10 MG tablet TAKE 1 TABLET BY MOUTH EVERY 6 HOURS AS NEEDED FOR ITCHING OR ANXIETY  60 tablet  2  . hyoscyamine (LEVSIN SL) 0.125 MG SL tablet Take 1-2 tablets by mouth every 4 hours as needed for bloating and diarrhea  30 tablet  0  . Magnesium 250 MG TABS Take 250 mg by mouth daily. Dr. Justine Null      . Multiple Vitamin (MULITIVITAMIN WITH MINERALS) TABS Take 1 tablet by mouth daily.      . NON FORMULARY Take 1 capsule by mouth daily. Mega Red      . nortriptyline (PAMELOR) 25 MG capsule TAKE 2 CAPSULES BY MOUTH EVERY NIGHT AT BEDTIME  60 capsule  2  . ranitidine (ZANTAC) 300 MG tablet TAKE 1 TABLET BY MOUTH AT BEDTIME  30 tablet  3  . simvastatin (ZOCOR) 40 MG tablet take 1 tablet by mouth every evening  30 tablet  11  . torsemide (DEMADEX) 10 MG tablet TAKE 1 TABLET BY MOUTH EVERY MORNING  30 tablet  0  . triamcinolone cream (KENALOG) 0.1 % Apply topically 2 (two) times daily as needed. pruritus  85.2 g  2  . ZETIA 10 MG tablet TAKE 1 TABLET BY MOUTH DAILY  30 tablet  2  . zoledronic acid (RECLAST) 5 MG/100ML SOLN Inject 5 mg into the vein once.      . benzonatate (TESSALON) 100 MG  capsule Take 1 capsule (  100 mg total) by mouth 3 (three) times daily as needed for cough.  20 capsule  0  . loratadine (CLARITIN) 10 MG tablet Take 10 mg by mouth daily.      Marland Kitchen PREMARIN 0.45 MG tablet TAKE 1 TABLET BY MOUTH ONCE DAILY FOR 21 DAYS, THEN DO NOT TAKE FOR 7 DAYS AS DIRECTED  60 tablet  0   No current facility-administered medications on file prior to visit.    Allergies  Allergen Reactions  . Alendronate Sodium Other (See Comments)    Severe chest pain similar to heart attack   . Cefuroxime Axetil Other (See Comments)    unknown  . Codeine Nausea And Vomiting  . Macrodantin   . Meperidine Hcl Nausea And Vomiting  . Other Other (See Comments)    Hismanal and Maprobamate  portobello mushrooms - diarrhea  . Sulfonamide Derivatives Hives, Itching and Swelling    Review of Systems  Review of Systems  Constitutional: Negative for fever and malaise/fatigue.  HENT: Negative for congestion.   Eyes: Negative for discharge.  Respiratory: Negative for shortness of breath.   Cardiovascular: Negative for chest pain, palpitations and leg swelling.  Gastrointestinal: Positive for abdominal pain. Negative for nausea and diarrhea.  Genitourinary: Negative for dysuria.  Musculoskeletal: Negative for falls.  Skin: Negative for rash.  Neurological: Negative for loss of consciousness and headaches.  Endo/Heme/Allergies: Negative for polydipsia.  Psychiatric/Behavioral: Negative for depression and suicidal ideas. The patient has insomnia. The patient is not nervous/anxious.     Objective  BP 135/73  Pulse 18  Temp(Src) 98.9 F (37.2 C) (Oral)  Resp 18  Ht 5\' 3"  (1.6 m)  Wt 163 lb (73.936 kg)  BMI 28.88 kg/m2  SpO2 97%  Physical Exam  Physical Exam  Constitutional: She is oriented to person, place, and time and well-developed, well-nourished, and in no distress. No distress.  HENT:  Head: Normocephalic and atraumatic.  Eyes: Conjunctivae are normal.  Neck: Neck supple.  No thyromegaly present.  Cardiovascular: Normal rate, regular rhythm and normal heart sounds.   No murmur heard. Pulmonary/Chest: Effort normal and breath sounds normal. She has no wheezes.  Abdominal: She exhibits no distension and no mass.  Musculoskeletal: She exhibits no edema.  Lymphadenopathy:    She has no cervical adenopathy.  Neurological: She is alert and oriented to person, place, and time.  Skin: Skin is warm and dry. No rash noted. She is not diaphoretic.  Psychiatric: Memory, affect and judgment normal.    Lab Results  Component Value Date   TSH 2.514 02/10/2014   Lab Results  Component Value Date   WBC 5.1 02/10/2014   HGB 14.0 02/10/2014   HCT 41.6 02/10/2014   MCV 89.8 02/10/2014   PLT 253 02/10/2014   Lab Results  Component Value Date   CREATININE 0.85 02/10/2014   BUN 17 02/10/2014   NA 140 02/10/2014   K 4.2 02/10/2014   CL 103 02/10/2014   CO2 28 02/10/2014   Lab Results  Component Value Date   ALT 31 02/10/2014   AST 38* 02/10/2014   ALKPHOS 99 02/10/2014   BILITOT 0.5 02/10/2014   Lab Results  Component Value Date   CHOL 170 02/10/2014   Lab Results  Component Value Date   HDL 53 02/10/2014   Lab Results  Component Value Date   LDLCALC 94 02/10/2014   Lab Results  Component Value Date   TRIG 114 02/10/2014   Lab Results  Component Value Date  CHOLHDL 3.2 02/10/2014     Assessment & Plan  Hypocalcemia resolved  Tachycardia RRR  HYPERLIPIDEMIA Tolerating statin, encouraged heart healthy diet, avoid trans fats, minimize simple carbs and saturated fats. Increase exercise as tolerated  GERD Avoid offending foods, start probiotics. Do not eat large meals in late evening and consider raising head of bed.   HYPOTHYROIDISM On Levothyroxine, continue to monitor  Hot flashes Try icool, regular diet with small, frequent meals, increased protein and minimize simple carbs. Regular exercise and avoid caffeine and alcohol

## 2014-02-13 NOTE — Assessment & Plan Note (Signed)
RRR 

## 2014-02-13 NOTE — Assessment & Plan Note (Signed)
On Levothyroxine, continue to monitor 

## 2014-02-13 NOTE — Assessment & Plan Note (Signed)
Try icool, regular diet with small, frequent meals, increased protein and minimize simple carbs. Regular exercise and avoid caffeine and alcohol

## 2014-02-13 NOTE — Assessment & Plan Note (Signed)
Tolerating statin, encouraged heart healthy diet, avoid trans fats, minimize simple carbs and saturated fats. Increase exercise as tolerated 

## 2014-02-13 NOTE — Assessment & Plan Note (Signed)
Avoid offending foods, start probiotics. Do not eat large meals in late evening and consider raising head of bed.  

## 2014-02-14 DIAGNOSIS — H269 Unspecified cataract: Secondary | ICD-10-CM | POA: Diagnosis not present

## 2014-02-14 DIAGNOSIS — H2510 Age-related nuclear cataract, unspecified eye: Secondary | ICD-10-CM | POA: Diagnosis not present

## 2014-02-18 ENCOUNTER — Other Ambulatory Visit: Payer: Self-pay | Admitting: Family Medicine

## 2014-02-23 ENCOUNTER — Other Ambulatory Visit: Payer: Self-pay | Admitting: Family Medicine

## 2014-02-23 DIAGNOSIS — M1990 Unspecified osteoarthritis, unspecified site: Secondary | ICD-10-CM | POA: Diagnosis not present

## 2014-02-23 DIAGNOSIS — M810 Age-related osteoporosis without current pathological fracture: Secondary | ICD-10-CM | POA: Diagnosis not present

## 2014-02-23 DIAGNOSIS — IMO0001 Reserved for inherently not codable concepts without codable children: Secondary | ICD-10-CM | POA: Diagnosis not present

## 2014-02-28 DIAGNOSIS — M810 Age-related osteoporosis without current pathological fracture: Secondary | ICD-10-CM | POA: Diagnosis not present

## 2014-02-28 DIAGNOSIS — IMO0001 Reserved for inherently not codable concepts without codable children: Secondary | ICD-10-CM | POA: Diagnosis not present

## 2014-03-02 DIAGNOSIS — M810 Age-related osteoporosis without current pathological fracture: Secondary | ICD-10-CM | POA: Diagnosis not present

## 2014-03-19 ENCOUNTER — Other Ambulatory Visit: Payer: Self-pay | Admitting: Family Medicine

## 2014-03-31 ENCOUNTER — Ambulatory Visit (INDEPENDENT_AMBULATORY_CARE_PROVIDER_SITE_OTHER): Payer: Medicare Other | Admitting: Physician Assistant

## 2014-03-31 ENCOUNTER — Encounter: Payer: Self-pay | Admitting: Physician Assistant

## 2014-03-31 ENCOUNTER — Ambulatory Visit (HOSPITAL_BASED_OUTPATIENT_CLINIC_OR_DEPARTMENT_OTHER)
Admission: RE | Admit: 2014-03-31 | Discharge: 2014-03-31 | Disposition: A | Discharge status: Home / Self Care | Payer: Medicare Other | Source: Ambulatory Visit | Attending: Physician Assistant | Admitting: Physician Assistant

## 2014-03-31 VITALS — BP 124/74 | HR 55 | Temp 98.2°F | Resp 14 | Ht 63.0 in | Wt 163.2 lb

## 2014-03-31 DIAGNOSIS — L821 Other seborrheic keratosis: Secondary | ICD-10-CM | POA: Diagnosis not present

## 2014-03-31 DIAGNOSIS — R197 Diarrhea, unspecified: Secondary | ICD-10-CM | POA: Diagnosis not present

## 2014-03-31 DIAGNOSIS — R152 Fecal urgency: Secondary | ICD-10-CM | POA: Diagnosis not present

## 2014-03-31 DIAGNOSIS — R195 Other fecal abnormalities: Secondary | ICD-10-CM | POA: Diagnosis not present

## 2014-03-31 DIAGNOSIS — D485 Neoplasm of uncertain behavior of skin: Secondary | ICD-10-CM | POA: Diagnosis not present

## 2014-03-31 DIAGNOSIS — R159 Full incontinence of feces: Secondary | ICD-10-CM

## 2014-03-31 DIAGNOSIS — D235 Other benign neoplasm of skin of trunk: Secondary | ICD-10-CM | POA: Diagnosis not present

## 2014-03-31 DIAGNOSIS — K5900 Constipation, unspecified: Secondary | ICD-10-CM | POA: Diagnosis not present

## 2014-03-31 DIAGNOSIS — L819 Disorder of pigmentation, unspecified: Secondary | ICD-10-CM | POA: Diagnosis not present

## 2014-03-31 NOTE — Progress Notes (Signed)
Pre visit review using our clinic review tool, if applicable. No additional management support is needed unless otherwise documented below in the visit note/SLS  

## 2014-03-31 NOTE — Progress Notes (Signed)
Patient presents to clinic today c/o fecal urgency that has been present over the last year, but has worsened over the last month, culminating in 2 episodes of fecal incontinence.  Patient endorses having normal BM every 2 days.  States that about once every 2 weeks, she will have an episode where she has an intense urge to defecate and must rush to the bathroom.  Patient has longstanding history of IBS.  Is currently not on medication.  Patient denies nausea, vomiting, reflux, melena, hematochezia or tenesmus.  Denies change to urinary symptoms.  Denies fever, chills, malaise or unexplainable weight loss.  Patient's last colonoscopy in 2013 revealing diverticulosis, internal hemorrhoids and a shallow ulcer of sigmoid colon.  Past Medical History  Diagnosis Date  . Hyperlipidemia     takes Zetia and Simvastatin nightly  . Palpitation   . Carotid bruit     Right  . Edema   . Arthritis   . PONV (postoperative nausea and vomiting)   . Hyperlipidemia 01/27/2012  . Allergy     portabella mushrooms/diarrhea  . Heart murmur   . Osteoporosis   . Fibromyalgia   . Thrush 03/23/2012  . Vaginitis 03/23/2012  . Sigmoid colon ulcer 03/23/2012  . Migraine 04/27/2012  . Hyperglycemia 04/27/2012  . Dysrhythmia     paroxysmal A fib, irregular heartbeat;takes Bisoprolol nightly  . History of migraine     last about 40yrs ago  . Foot drop, right   . Joint pain   . Joint swelling   . Osteoporosis   . Chronic back pain   . Skin problem in pregnancy     takes gabapentin 3 times a day and also has an ointment  . GERD (gastroesophageal reflux disease)     takes Zantac and Nexium daily  . Hemorrhoids   . Diverticulosis   . IBS (irritable bowel syndrome)     takes align daily  . Cataracts, bilateral   . Hypocalcemia 03/29/2013  . Depression with anxiety 03/29/2013  . Tachycardia 07/03/2013  . Onychomycosis 07/03/2013  . Adenomatous polyp of colon 1991, 2008    Current Outpatient Prescriptions on File Prior to  Visit  Medication Sig Dispense Refill  . aspirin EC 81 MG tablet Take 81 mg by mouth daily.      . Biotin 5 MG CAPS Take 1 capsule by mouth daily.        . bisoprolol (ZEBETA) 5 MG tablet TAKE 1 TABLET BY MOUTH TWICE DAILY  60 tablet  0  . Calcium Carbonate-Vitamin D (CALCIUM 600+D) 600-200 MG-UNIT TABS Take 1 tablet by mouth daily after breakfast. Dr. Justine Null       . citalopram (CELEXA) 10 MG tablet TAKE 1 TABLET BY MOUTH EVERY DAY  30 tablet  3  . esomeprazole (NEXIUM) 40 MG capsule Take 1 capsule (40 mg total) by mouth every morning.  30 capsule  11  . Eszopiclone 3 MG TABS TAKE 1 TABLET BY MOUTH EVERY DAY AT BEDTIME AS NEEDED  30 tablet  3  . gabapentin (NEURONTIN) 600 MG tablet TAKE 1 TABLET BY MOUTH THREE TIMES DAILY  90 tablet  0  . hydrOXYzine (ATARAX/VISTARIL) 10 MG tablet TAKE 1 TABLET BY MOUTH EVERY 6 HOURS AS NEEDED FOR ITCHING OR ANXIETY  60 tablet  0  . hyoscyamine (LEVSIN SL) 0.125 MG SL tablet Take 1-2 tablets by mouth every 4 hours as needed for bloating and diarrhea  30 tablet  0  . Magnesium 250 MG TABS Take 250 mg  by mouth daily. Dr. Justine Null      . Multiple Vitamin (MULITIVITAMIN WITH MINERALS) TABS Take 1 tablet by mouth daily.      . NON FORMULARY Take 1 capsule by mouth daily. Mega Red      . nortriptyline (PAMELOR) 25 MG capsule TAKE 2 CAPSULES BY MOUTH EVERY NIGHT AT BEDTIME  60 capsule  0  . ranitidine (ZANTAC) 300 MG tablet TAKE 1 TABLET BY MOUTH AT BEDTIME  30 tablet  3  . simvastatin (ZOCOR) 40 MG tablet take 1 tablet by mouth every evening  30 tablet  11  . torsemide (DEMADEX) 10 MG tablet TAKE 1 TABLET BY MOUTH EVERY MORNING  30 tablet  0  . triamcinolone cream (KENALOG) 0.1 % Apply topically 2 (two) times daily as needed. pruritus  85.2 g  2  . ZETIA 10 MG tablet TAKE 1 TABLET BY MOUTH DAILY  30 tablet  0  . zoledronic acid (RECLAST) 5 MG/100ML SOLN Inject 5 mg into the vein once.       No current facility-administered medications on file prior to visit.     Allergies  Allergen Reactions  . Alendronate Sodium Other (See Comments)    Severe chest pain similar to heart attack   . Cefuroxime Axetil Other (See Comments)    unknown  . Codeine Nausea And Vomiting  . Macrodantin   . Meperidine Hcl Nausea And Vomiting  . Other Other (See Comments)    Hismanal and Maprobamate  portobello mushrooms - diarrhea  . Sulfonamide Derivatives Hives, Itching and Swelling    Family History  Problem Relation Age of Onset  . Bipolar disorder Mother   . Stomach cancer Mother   . Mental illness Mother   . Stroke Father   . Colon cancer Father   . Throat cancer Maternal Uncle     larynx  . HIV Son     Aids  . Anesthesia problems Neg Hx   . Stomach cancer Maternal Grandfather   . Stomach cancer Paternal Grandmother   . Anxiety disorder Mother   . Diabetes Neg Hx   . Heart disease Maternal Grandmother     History   Social History  . Marital Status: Divorced    Spouse Name: N/A    Number of Children: N/A  . Years of Education: N/A   Social History Main Topics  . Smoking status: Former Smoker -- 4 years    Types: Cigarettes  . Smokeless tobacco: Never Used     Comment: quit 20+yrs ago  . Alcohol Use: 8.4 oz/week    14 Glasses of wine per week     Comment: WINE WITH DINNER. 2 GLASSES RED WINE  . Drug Use: No  . Sexual Activity: No   Other Topics Concern  . None   Social History Narrative  . None   Review of Systems - See HPI.  All other ROS are negative.  BP 124/74  Pulse 55  Temp(Src) 98.2 F (36.8 C) (Oral)  Resp 14  Ht 5\' 3"  (1.6 m)  Wt 163 lb 4 oz (74.05 kg)  BMI 28.93 kg/m2  SpO2 98%  Physical Exam  Vitals reviewed. Constitutional: She is oriented to person, place, and time and well-developed, well-nourished, and in no distress.  HENT:  Head: Normocephalic and atraumatic.  Neck: Neck supple.  Cardiovascular: Normal rate, regular rhythm, normal heart sounds and intact distal pulses.   Pulmonary/Chest: Effort  normal and breath sounds normal. No respiratory distress. She has no  wheezes. She has no rales. She exhibits no tenderness.  Abdominal: Soft. Bowel sounds are normal. She exhibits no distension and no mass. There is no tenderness. There is no rebound and no guarding.  Neurological: She is alert and oriented to person, place, and time.  Skin: Skin is warm and dry. No rash noted.  Psychiatric: Affect normal.    Recent Results (from the past 2160 hour(s))  RENAL FUNCTION PANEL     Status: None   Collection Time    02/10/14 10:26 AM      Result Value Ref Range   Sodium 140  135 - 145 mEq/L   Potassium 4.2  3.5 - 5.3 mEq/L   Chloride 103  96 - 112 mEq/L   CO2 28  19 - 32 mEq/L   Glucose, Bld 92  70 - 99 mg/dL   BUN 17  6 - 23 mg/dL   Creat 0.85  0.50 - 1.10 mg/dL   Albumin 4.5  3.5 - 5.2 g/dL   Calcium 9.1  8.4 - 10.5 mg/dL   Phosphorus 3.4  2.3 - 4.6 mg/dL  HEPATIC FUNCTION PANEL     Status: Abnormal   Collection Time    02/10/14 10:26 AM      Result Value Ref Range   Total Bilirubin 0.5  0.2 - 1.2 mg/dL   Bilirubin, Direct 0.1  0.0 - 0.3 mg/dL   Indirect Bilirubin 0.4  0.2 - 1.2 mg/dL   Alkaline Phosphatase 99  39 - 117 U/L   AST 38 (*) 0 - 37 U/L   ALT 31  0 - 35 U/L   Total Protein 6.9  6.0 - 8.3 g/dL   Albumin 4.5  3.5 - 5.2 g/dL  CBC     Status: None   Collection Time    02/10/14 10:26 AM      Result Value Ref Range   WBC 5.1  4.0 - 10.5 K/uL   RBC 4.63  3.87 - 5.11 MIL/uL   Hemoglobin 14.0  12.0 - 15.0 g/dL   HCT 41.6  36.0 - 46.0 %   MCV 89.8  78.0 - 100.0 fL   MCH 30.2  26.0 - 34.0 pg   MCHC 33.7  30.0 - 36.0 g/dL   RDW 13.8  11.5 - 15.5 %   Platelets 253  150 - 400 K/uL  TSH     Status: None   Collection Time    02/10/14 10:26 AM      Result Value Ref Range   TSH 2.514  0.350 - 4.500 uIU/mL  VITAMIN D 25 HYDROXY     Status: None   Collection Time    02/10/14 10:26 AM      Result Value Ref Range   Vit D, 25-Hydroxy 57  30 - 89 ng/mL   Comment: This assay  accurately quantifies Vitamin D, which is the sum of the     25-Hydroxy forms of Vitamin D2 and D3.  Studies have shown that the     optimum concentration of 25-Hydroxy Vitamin D is 30 ng/mL or higher.      Concentrations of Vitamin D between 20 and 29 ng/mL are considered to     be insufficient and concentrations less than 20 ng/mL are considered     to be deficient for Vitamin D.  LIPID PANEL     Status: None   Collection Time    02/10/14 10:26 AM      Result Value Ref Range  Cholesterol 170  0 - 200 mg/dL   Comment: ATP III Classification:           < 200        mg/dL        Desirable          200 - 239     mg/dL        Borderline High          >= 240        mg/dL        High         Triglycerides 114  <150 mg/dL   HDL 53  >39 mg/dL   Total CHOL/HDL Ratio 3.2     VLDL 23  0 - 40 mg/dL   LDL Cholesterol 94  0 - 99 mg/dL   Comment:       Total Cholesterol/HDL Ratio:CHD Risk                            Coronary Heart Disease Risk Table                                            Men       Women              1/2 Average Risk              3.4        3.3                  Average Risk              5.0        4.4               2X Average Risk              9.6        7.1               3X Average Risk             23.4       11.0     Use the calculated Patient Ratio above and the CHD Risk table      to determine the patient's CHD Risk.     ATP III Classification (LDL):           < 100        mg/dL         Optimal          100 - 129     mg/dL         Near or Above Optimal          130 - 159     mg/dL         Borderline High          160 - 189     mg/dL         High           > 190        mg/dL         Very High          Assessment/Plan: Incontinence of feces with fecal urgency Will obtain labs and x-ray of abdomen to assess stool burden. IFOB order placed.  Patient to  start a daily probiotic and increase fiber supplement.  Discussed Questran for loose stools/urgency, but patient  defers at present.  Referral to GI placed.

## 2014-03-31 NOTE — Patient Instructions (Signed)
Please use IFOB at home and mail back to lab.  Proceed to 1st floor for x-ray.  I will call you with your results.  Start a good diet that is rich in fiber.  Take a daily Probiotic (Align).  Can use immodium if loose stool worsens.  You will be contacted by Gastroenterology for further assessment.  Please call or return if new symptoms develop.

## 2014-03-31 NOTE — Assessment & Plan Note (Signed)
Will obtain labs and x-ray of abdomen to assess stool burden. IFOB order placed.  Patient to start a daily probiotic and increase fiber supplement.  Discussed Questran for loose stools/urgency, but patient defers at present.  Referral to GI placed.

## 2014-04-04 ENCOUNTER — Other Ambulatory Visit: Payer: Self-pay | Admitting: Family Medicine

## 2014-04-07 ENCOUNTER — Other Ambulatory Visit: Payer: Self-pay | Admitting: Family Medicine

## 2014-04-14 DIAGNOSIS — H2510 Age-related nuclear cataract, unspecified eye: Secondary | ICD-10-CM | POA: Diagnosis not present

## 2014-04-14 DIAGNOSIS — H25019 Cortical age-related cataract, unspecified eye: Secondary | ICD-10-CM | POA: Diagnosis not present

## 2014-04-17 ENCOUNTER — Other Ambulatory Visit: Payer: Self-pay | Admitting: Family Medicine

## 2014-04-24 ENCOUNTER — Encounter: Payer: Self-pay | Admitting: Gastroenterology

## 2014-04-24 ENCOUNTER — Other Ambulatory Visit: Payer: Self-pay | Admitting: Family Medicine

## 2014-04-24 ENCOUNTER — Other Ambulatory Visit (INDEPENDENT_AMBULATORY_CARE_PROVIDER_SITE_OTHER): Payer: Medicare Other

## 2014-04-24 ENCOUNTER — Ambulatory Visit (INDEPENDENT_AMBULATORY_CARE_PROVIDER_SITE_OTHER): Payer: Medicare Other | Admitting: Gastroenterology

## 2014-04-24 VITALS — BP 140/80 | HR 68 | Ht 62.5 in | Wt 165.4 lb

## 2014-04-24 DIAGNOSIS — R159 Full incontinence of feces: Secondary | ICD-10-CM

## 2014-04-24 DIAGNOSIS — R197 Diarrhea, unspecified: Secondary | ICD-10-CM

## 2014-04-24 DIAGNOSIS — R152 Fecal urgency: Secondary | ICD-10-CM

## 2014-04-24 LAB — MAGNESIUM: Magnesium: 2 mg/dL (ref 1.5–2.5)

## 2014-04-24 NOTE — Progress Notes (Signed)
    History of Present Illness: This is a 80 year old female who relates intermittent episodes of fecal continence. The symptoms are always associated with loose stool. In general she has one formed bowel movement per day. Episodes of fecal incontinence happen about every 2-4 weeks. She wears depends at all times. She cannot determined which foods or activities precipitate her symptoms. She underwent colonoscopy in June 2013 showing diverticulosis, small adenomatous colon polyps and internal and external hemorrhoids.  Current Medications, Allergies, Past Medical History, Past Surgical History, Family History and Social History were reviewed in [employer] record.  Physical Exam: General: Well developed , well nourished, no acute distress Head: Normocephalic and atraumatic Eyes:  sclerae anicteric, EOMI Ears: Normal auditory acuity Mouth: No deformity or lesions Lungs: Clear throughout to auscultation Heart: Regular rate and rhythm; no murmurs, rubs or bruits Abdomen: Soft, non tender and non distended. No masses, hepatosplenomegaly or hernias noted. Normal Bowel sounds Rectal: Decrease sphincter tone with decreased anal squeeze, small external hemorrhoidal tags, no internal lesions, Hemoccult negative soft dark brown stool the vault Musculoskeletal: Symmetrical with no gross deformities  Pulses:  Normal pulses noted Extremities: No clubbing, cyanosis, edema or deformities noted Neurological: Alert oriented x 4, grossly nonfocal Psychological:  Alert and cooperative. Normal mood and affect  Assessment and Recommendations:  1. Fecal incontinence. Decreased anal sphincter tone. Will check a magnesium level and discontinue magnesium if her serum levels are adequate. Begin Imodium every other day. Kegel exercises every day. If her symptoms do not substantially improve with the above measures we will recommend further evaluation with referral to [facility].

## 2014-04-24 NOTE — Patient Instructions (Signed)
Please take Imodium every other day for your diarrhea episodes. Your physician has requested that you go to the basement for the following lab work before leaving today: Magnesium We have given you Kegel exercises to do to strengthen your pelvic floor muscles.   CC:  Penni Homans MD

## 2014-05-02 ENCOUNTER — Other Ambulatory Visit (INDEPENDENT_AMBULATORY_CARE_PROVIDER_SITE_OTHER): Payer: Medicare Other

## 2014-05-02 ENCOUNTER — Telehealth: Payer: Self-pay

## 2014-05-02 ENCOUNTER — Other Ambulatory Visit: Payer: Medicare Other

## 2014-05-02 DIAGNOSIS — R152 Fecal urgency: Secondary | ICD-10-CM

## 2014-05-02 DIAGNOSIS — Z860100 Personal history of colon polyps, unspecified: Secondary | ICD-10-CM

## 2014-05-02 DIAGNOSIS — R159 Full incontinence of feces: Secondary | ICD-10-CM

## 2014-05-02 DIAGNOSIS — H2510 Age-related nuclear cataract, unspecified eye: Secondary | ICD-10-CM | POA: Diagnosis not present

## 2014-05-02 DIAGNOSIS — D649 Anemia, unspecified: Secondary | ICD-10-CM

## 2014-05-02 LAB — FECAL OCCULT BLOOD, IMMUNOCHEMICAL: Fecal Occult Bld: NEGATIVE

## 2014-05-02 NOTE — Telephone Encounter (Signed)
Message copied by Varney Daily on Tue May 02, 2014  2:09 PM ------      Message from: Irven Baltimore      Created: Tue May 02, 2014  8:15 AM      Regarding: orders       Willette Cluster from [facility] needs an IFOB order put in for this patient ------

## 2014-05-04 ENCOUNTER — Ambulatory Visit (INDEPENDENT_AMBULATORY_CARE_PROVIDER_SITE_OTHER): Payer: Medicare Other | Admitting: Cardiology

## 2014-05-04 ENCOUNTER — Telehealth: Payer: Self-pay | Admitting: Cardiology

## 2014-05-04 ENCOUNTER — Observation Stay (HOSPITAL_COMMUNITY): Payer: Medicare Other

## 2014-05-04 ENCOUNTER — Inpatient Hospital Stay (HOSPITAL_COMMUNITY)
Admission: AD | Admit: 2014-05-04 | Discharge: 2014-05-07 | DRG: 310 | Disposition: A | Discharge status: Home / Self Care | Payer: Medicare Other | Source: Ambulatory Visit | Attending: Cardiology | Admitting: Cardiology

## 2014-05-04 ENCOUNTER — Encounter: Payer: Self-pay | Admitting: Cardiology

## 2014-05-04 ENCOUNTER — Encounter (HOSPITAL_COMMUNITY): Payer: Self-pay | Admitting: *Deleted

## 2014-05-04 VITALS — BP 126/91 | HR 140 | Ht 63.0 in | Wt 161.0 lb

## 2014-05-04 DIAGNOSIS — Z91018 Allergy to other foods: Secondary | ICD-10-CM | POA: Diagnosis not present

## 2014-05-04 DIAGNOSIS — Z8249 Family history of ischemic heart disease and other diseases of the circulatory system: Secondary | ICD-10-CM

## 2014-05-04 DIAGNOSIS — F341 Dysthymic disorder: Secondary | ICD-10-CM | POA: Diagnosis present

## 2014-05-04 DIAGNOSIS — I10 Essential (primary) hypertension: Secondary | ICD-10-CM

## 2014-05-04 DIAGNOSIS — Z7901 Long term (current) use of anticoagulants: Secondary | ICD-10-CM | POA: Diagnosis not present

## 2014-05-04 DIAGNOSIS — I4892 Unspecified atrial flutter: Principal | ICD-10-CM | POA: Diagnosis present

## 2014-05-04 DIAGNOSIS — K589 Irritable bowel syndrome without diarrhea: Secondary | ICD-10-CM | POA: Diagnosis present

## 2014-05-04 DIAGNOSIS — M797 Fibromyalgia: Secondary | ICD-10-CM | POA: Diagnosis present

## 2014-05-04 DIAGNOSIS — Z818 Family history of other mental and behavioral disorders: Secondary | ICD-10-CM | POA: Diagnosis not present

## 2014-05-04 DIAGNOSIS — M810 Age-related osteoporosis without current pathological fracture: Secondary | ICD-10-CM | POA: Diagnosis present

## 2014-05-04 DIAGNOSIS — Z87891 Personal history of nicotine dependence: Secondary | ICD-10-CM

## 2014-05-04 DIAGNOSIS — I379 Nonrheumatic pulmonary valve disorder, unspecified: Secondary | ICD-10-CM | POA: Diagnosis not present

## 2014-05-04 DIAGNOSIS — Z79899 Other long term (current) drug therapy: Secondary | ICD-10-CM

## 2014-05-04 DIAGNOSIS — Z981 Arthrodesis status: Secondary | ICD-10-CM

## 2014-05-04 DIAGNOSIS — I483 Typical atrial flutter: Secondary | ICD-10-CM

## 2014-05-04 DIAGNOSIS — E785 Hyperlipidemia, unspecified: Secondary | ICD-10-CM | POA: Diagnosis present

## 2014-05-04 DIAGNOSIS — Z800 Family history of malignant neoplasm of digestive organs: Secondary | ICD-10-CM

## 2014-05-04 DIAGNOSIS — E7800 Pure hypercholesterolemia, unspecified: Secondary | ICD-10-CM | POA: Diagnosis present

## 2014-05-04 DIAGNOSIS — I4891 Unspecified atrial fibrillation: Secondary | ICD-10-CM | POA: Diagnosis not present

## 2014-05-04 DIAGNOSIS — K219 Gastro-esophageal reflux disease without esophagitis: Secondary | ICD-10-CM | POA: Diagnosis present

## 2014-05-04 DIAGNOSIS — Z96649 Presence of unspecified artificial hip joint: Secondary | ICD-10-CM | POA: Diagnosis not present

## 2014-05-04 DIAGNOSIS — Z823 Family history of stroke: Secondary | ICD-10-CM

## 2014-05-04 DIAGNOSIS — I484 Atypical atrial flutter: Secondary | ICD-10-CM | POA: Diagnosis present

## 2014-05-04 DIAGNOSIS — F418 Other specified anxiety disorders: Secondary | ICD-10-CM | POA: Diagnosis present

## 2014-05-04 LAB — COMPREHENSIVE METABOLIC PANEL
ALT: 19 U/L (ref 0–35)
AST: 22 U/L (ref 0–37)
Albumin: 3.7 g/dL (ref 3.5–5.2)
Alkaline Phosphatase: 91 U/L (ref 39–117)
Anion gap: 16 — ABNORMAL HIGH (ref 5–15)
BUN: 22 mg/dL (ref 6–23)
CO2: 22 mEq/L (ref 19–32)
Calcium: 8.9 mg/dL (ref 8.4–10.5)
Chloride: 102 mEq/L (ref 96–112)
Creatinine, Ser: 0.83 mg/dL (ref 0.50–1.10)
GFR calc Af Amer: 77 mL/min — ABNORMAL LOW (ref >90)
GFR calc non Af Amer: 66 mL/min — ABNORMAL LOW (ref >90)
Glucose, Bld: 191 mg/dL — ABNORMAL HIGH (ref 70–99)
Potassium: 4.2 mEq/L (ref 3.7–5.3)
Sodium: 140 mEq/L (ref 137–147)
Total Bilirubin: 0.3 mg/dL (ref 0.3–1.2)
Total Protein: 6.7 g/dL (ref 6.0–8.3)

## 2014-05-04 LAB — CBC WITH DIFFERENTIAL/PLATELET
Basophils Absolute: 0 10*3/uL (ref 0.0–0.1)
Basophils Relative: 0 % (ref 0–1)
Eosinophils Absolute: 0.1 10*3/uL (ref 0.0–0.7)
Eosinophils Relative: 1 % (ref 0–5)
HCT: 43.9 % (ref 36.0–46.0)
Hemoglobin: 14.4 g/dL (ref 12.0–15.0)
Lymphocytes Relative: 39 % (ref 12–46)
Lymphs Abs: 3.3 10*3/uL (ref 0.7–4.0)
MCH: 30.5 pg (ref 26.0–34.0)
MCHC: 32.8 g/dL (ref 30.0–36.0)
MCV: 93 fL (ref 78.0–100.0)
Monocytes Absolute: 0.6 10*3/uL (ref 0.1–1.0)
Monocytes Relative: 7 % (ref 3–12)
Neutro Abs: 4.6 10*3/uL (ref 1.7–7.7)
Neutrophils Relative %: 53 % (ref 43–77)
Platelets: 230 10*3/uL (ref 150–400)
RBC: 4.72 MIL/uL (ref 3.87–5.11)
RDW: 13.4 % (ref 11.5–15.5)
WBC: 8.6 10*3/uL (ref 4.0–10.5)

## 2014-05-04 LAB — TROPONIN I: Troponin I: <0.3 ng/mL (ref <0.30)

## 2014-05-04 LAB — TSH: TSH: 7.6 u[IU]/mL — ABNORMAL HIGH (ref 0.350–4.500)

## 2014-05-04 LAB — MAGNESIUM: Magnesium: 1.9 mg/dL (ref 1.5–2.5)

## 2014-05-04 LAB — PROTIME-INR
INR: 1.06 (ref 0.00–1.49)
Prothrombin Time: 13.8 seconds (ref 11.6–15.2)

## 2014-05-04 MED ORDER — FAMOTIDINE 40 MG PO TABS
40.0000 mg | ORAL_TABLET | Freq: Every day | ORAL | Status: DC
  Administered 2014-05-04 – 2014-05-06 (×3): 40 mg via ORAL
  Filled 2014-05-04 (×4): qty 1

## 2014-05-04 MED ORDER — CALCIUM CARBONATE-VITAMIN D 600-200 MG-UNIT PO TABS: 1.0000 | ORAL_TABLET | Freq: Every day | ORAL | Status: DC

## 2014-05-04 MED ORDER — TORSEMIDE 10 MG PO TABS
10.0000 mg | ORAL_TABLET | Freq: Every day | ORAL | Status: DC
  Administered 2014-05-06: 10 mg via ORAL
  Filled 2014-05-04 (×2): qty 1

## 2014-05-04 MED ORDER — PANTOPRAZOLE SODIUM 40 MG PO TBEC
80.0000 mg | DELAYED_RELEASE_TABLET | Freq: Every day | ORAL | Status: DC
  Administered 2014-05-04 – 2014-05-06 (×3): 80 mg via ORAL
  Filled 2014-05-04 (×3): qty 2

## 2014-05-04 MED ORDER — ADULT MULTIVITAMIN W/MINERALS CH
1.0000 | ORAL_TABLET | Freq: Every day | ORAL | Status: DC
  Administered 2014-05-05 – 2014-05-07 (×3): 1 via ORAL
  Filled 2014-05-04 (×3): qty 1

## 2014-05-04 MED ORDER — ZOLPIDEM TARTRATE 5 MG PO TABS
5.0000 mg | ORAL_TABLET | Freq: Every evening | ORAL | Status: DC | PRN
  Administered 2014-05-05 – 2014-05-06 (×2): 5 mg via ORAL
  Filled 2014-05-04 (×2): qty 1

## 2014-05-04 MED ORDER — BIOTIN 5 MG PO CAPS: 1.0000 | ORAL_CAPSULE | Freq: Every day | ORAL | Status: DC

## 2014-05-04 MED ORDER — HYDROXYZINE HCL 10 MG PO TABS
20.0000 mg | ORAL_TABLET | Freq: Every day | ORAL | Status: DC
  Administered 2014-05-04 – 2014-05-06 (×3): 20 mg via ORAL
  Filled 2014-05-04 (×4): qty 2

## 2014-05-04 MED ORDER — SODIUM CHLORIDE 0.9 % IJ SOLN: 3.0000 mL | INTRAMUSCULAR | Status: DC | PRN

## 2014-05-04 MED ORDER — CITALOPRAM HYDROBROMIDE 10 MG PO TABS
10.0000 mg | ORAL_TABLET | Freq: Every day | ORAL | Status: DC
  Administered 2014-05-05 – 2014-05-07 (×3): 10 mg via ORAL
  Filled 2014-05-04 (×3): qty 1

## 2014-05-04 MED ORDER — TRIAMCINOLONE ACETONIDE 0.1 % EX CREA
1.0000 "application " | TOPICAL_CREAM | Freq: Two times a day (BID) | CUTANEOUS | Status: DC | PRN
  Filled 2014-05-04: qty 15

## 2014-05-04 MED ORDER — SODIUM CHLORIDE 0.9 % IJ SOLN
3.0000 mL | Freq: Two times a day (BID) | INTRAMUSCULAR | Status: DC
  Administered 2014-05-04 – 2014-05-06 (×4): 3 mL via INTRAVENOUS

## 2014-05-04 MED ORDER — GABAPENTIN 600 MG PO TABS
600.0000 mg | ORAL_TABLET | Freq: Three times a day (TID) | ORAL | Status: DC
  Administered 2014-05-04 – 2014-05-07 (×8): 600 mg via ORAL
  Filled 2014-05-04 (×10): qty 1

## 2014-05-04 MED ORDER — NONFORMULARY OR COMPOUNDED ITEM
Freq: Every day | Status: DC
  Administered 2014-05-05 – 2014-05-07 (×3): via ORAL
  Filled 2014-05-04 (×2): qty 1

## 2014-05-04 MED ORDER — NORTRIPTYLINE HCL 25 MG PO CAPS
50.0000 mg | ORAL_CAPSULE | Freq: Every day | ORAL | Status: DC
  Administered 2014-05-04 – 2014-05-06 (×3): 50 mg via ORAL
  Filled 2014-05-04 (×4): qty 2

## 2014-05-04 MED ORDER — OFLOXACIN 0.3 % OP SOLN
1.0000 [drp] | Freq: Four times a day (QID) | OPHTHALMIC | Status: DC
  Administered 2014-05-04 – 2014-05-07 (×11): 1 [drp] via OPHTHALMIC
  Filled 2014-05-04: qty 5

## 2014-05-04 MED ORDER — EZETIMIBE 10 MG PO TABS
10.0000 mg | ORAL_TABLET | Freq: Every evening | ORAL | Status: DC
  Administered 2014-05-04 – 2014-05-06 (×3): 10 mg via ORAL
  Filled 2014-05-04 (×4): qty 1

## 2014-05-04 MED ORDER — DIFLUPREDNATE 0.05 % OP EMUL
1.0000 [drp] | Freq: Three times a day (TID) | OPHTHALMIC | Status: DC
  Administered 2014-05-04 – 2014-05-07 (×8): 1 [drp] via OPHTHALMIC

## 2014-05-04 MED ORDER — CLOBETASOL PROPIONATE 0.05 % EX OINT
1.0000 "application " | TOPICAL_OINTMENT | Freq: Two times a day (BID) | CUTANEOUS | Status: DC | PRN
  Filled 2014-05-04: qty 15

## 2014-05-04 MED ORDER — ATORVASTATIN CALCIUM 20 MG PO TABS
20.0000 mg | ORAL_TABLET | Freq: Every day | ORAL | Status: DC
  Administered 2014-05-04 – 2014-05-06 (×3): 20 mg via ORAL
  Filled 2014-05-04 (×4): qty 1

## 2014-05-04 MED ORDER — DILTIAZEM LOAD VIA INFUSION
10.0000 mg | Freq: Once | INTRAVENOUS | Status: AC
  Administered 2014-05-04: 10 mg via INTRAVENOUS
  Filled 2014-05-04: qty 10

## 2014-05-04 MED ORDER — CALCIUM CARBONATE-VITAMIN D 500-200 MG-UNIT PO TABS
1.0000 | ORAL_TABLET | Freq: Every day | ORAL | Status: DC
  Administered 2014-05-05 – 2014-05-07 (×3): 1 via ORAL
  Filled 2014-05-04 (×4): qty 1

## 2014-05-04 MED ORDER — APIXABAN 5 MG PO TABS
5.0000 mg | ORAL_TABLET | Freq: Two times a day (BID) | ORAL | Status: DC
  Administered 2014-05-04 – 2014-05-07 (×6): 5 mg via ORAL
  Filled 2014-05-04 (×7): qty 1

## 2014-05-04 MED ORDER — ONDANSETRON HCL 4 MG/2ML IJ SOLN: 4.0000 mg | Freq: Four times a day (QID) | INTRAMUSCULAR | Status: DC | PRN

## 2014-05-04 MED ORDER — SODIUM CHLORIDE 0.9 % IV SOLN: 250.0000 mL | INTRAVENOUS | Status: DC | PRN

## 2014-05-04 MED ORDER — BRIMONIDINE TARTRATE 0.2 % OP SOLN
1.0000 [drp] | Freq: Two times a day (BID) | OPHTHALMIC | Status: DC
  Administered 2014-05-05 – 2014-05-07 (×5): 1 [drp] via OPHTHALMIC
  Filled 2014-05-04 (×2): qty 5

## 2014-05-04 MED ORDER — DILTIAZEM HCL 100 MG IV SOLR
5.0000 mg/h | INTRAVENOUS | Status: AC
  Administered 2014-05-04 – 2014-05-05 (×2): 5 mg/h via INTRAVENOUS
  Filled 2014-05-04 (×2): qty 100

## 2014-05-04 MED ORDER — BISOPROLOL FUMARATE 5 MG PO TABS
5.0000 mg | ORAL_TABLET | Freq: Two times a day (BID) | ORAL | Status: DC
  Administered 2014-05-04 – 2014-05-07 (×6): 5 mg via ORAL
  Filled 2014-05-04 (×7): qty 1

## 2014-05-04 MED ORDER — ACETAMINOPHEN 325 MG PO TABS
650.0000 mg | ORAL_TABLET | ORAL | Status: DC | PRN
  Administered 2014-05-05 – 2014-05-06 (×2): 650 mg via ORAL
  Filled 2014-05-04 (×2): qty 2

## 2014-05-04 NOTE — Progress Notes (Signed)
Met with patient to check on her. She is still feeling funny but resting comfortably in bed doing a crossword puzzle. I explained the plan to her. Orders are in. Will sign out to call PA to please check labs before they sign off for the evening. Azalia Neuberger PA-C

## 2014-05-04 NOTE — Progress Notes (Signed)
Pt received into room 2w21, pt placed on tele, HR 140s A-flutter, pt states no pain at this time but feels a little dizzy, Hinton Dyer PA notified of pt arrival Rickard Rhymes, [place]

## 2014-05-04 NOTE — Telephone Encounter (Signed)
New problem   Pt is having low blood pressure and high heart rate// pt would like to be advise as to what she need to do. Please call pt.

## 2014-05-04 NOTE — Telephone Encounter (Signed)
Will see patient today at 2:30, patient aware

## 2014-05-04 NOTE — Progress Notes (Signed)
Discussed patient with Dr. [name]. Start diltiazem, continue beta blocker, start apixaban, stop aspirin. NPO after midnight tomorrow in case TEE/DCCV is needed. Check basic labs, to include thyroid function and troponin as well. 2D echo and CXR ordered. Have requested pharmacy help [place] reconcile her naltrexone as there is no way to order her home dose in Epic (she takes 5mg  daily and dosage comes in 25-50mg ). Mort Smelser PA-C

## 2014-05-04 NOTE — Progress Notes (Signed)
Patient ID: Chelsea Hancock MRN: [id number], DOB/AGE: Jun 10, 1934   Admit date: (Not on file)   Primary Physician: Penni Homans, MD Primary Cardiologist: Dr. [name]  Pt. Profile:  80 year old woman with new onset atrial flutter  Problem List  Past Medical History  Diagnosis Date  . Hyperlipidemia     takes Zetia and Simvastatin nightly  . Palpitation   . Carotid bruit     Right  . Edema   . Arthritis   . PONV (postoperative nausea and vomiting)   . Hyperlipidemia 01/27/2012  . Allergy     portabella mushrooms/diarrhea  . Heart murmur   . Osteoporosis   . Fibromyalgia   . Thrush 03/23/2012  . Vaginitis 03/23/2012  . Sigmoid colon ulcer 03/23/2012  . Migraine 04/27/2012  . Hyperglycemia 04/27/2012  . Dysrhythmia     paroxysmal A fib, irregular heartbeat;takes Bisoprolol nightly  . History of migraine     last about 75yrs ago  . Foot drop, right   . Joint pain   . Joint swelling   . Osteoporosis   . Chronic back pain   . Skin problem in pregnancy     takes gabapentin 3 times a day and also has an ointment  . GERD (gastroesophageal reflux disease)     takes Zantac and Nexium daily  . Hemorrhoids   . Diverticulosis   . IBS (irritable bowel syndrome)     takes align daily  . Cataracts, bilateral   . Hypocalcemia 03/29/2013  . Depression with anxiety 03/29/2013  . Tachycardia 07/03/2013  . Onychomycosis 07/03/2013  . Adenomatous polyp of colon 1991, 2008    Past Surgical History  Procedure Laterality Date  . Abdominal hysterectomy  1981  . Lumbar laminectomy  1960    x 2  . Veins stripped  1970  . Total hip arthroplasty Left 2012  . Carpal tunnel release Left     ulnar nerve release at elbow  . Cervical fusion  2003, 2005  . Back surgery  1997  . Tonsillectomy  1943  . Left elbow surgery  06/03/11  . Colonoscopy    . Posterior cervical fusion/foraminotomy  08/03/2012    Procedure: POSTERIOR CERVICAL FUSION/FORAMINOTOMY LEVEL 5;  Surgeon: [name],  MD;  Location: [facility];  Service: Neurosurgery;  Laterality: N/A;  Cervical four to Thoracic two Posterior cervical decompression with Facet and Pedicle screw fixation  . Carpal tunnel release Right 07/23/13     Allergies  Allergies  Allergen Reactions  . Alendronate Sodium Other (See Comments)    Severe chest pain similar to heart attack   . Cefuroxime Axetil Other (See Comments)    unknown  . Codeine Nausea And Vomiting  . Macrodantin   . Meperidine Hcl Nausea And Vomiting  . Other Other (See Comments)    Hismanal and Maprobamate  portobello mushrooms - diarrhea  . Sulfonamide Derivatives Hives, Itching and Swelling    HPI This 80 year old woman is seen in my office today as an urgent work in.  We had not seen her here in the office since 07/15/12.  She has a past history of dyslipidemia and a past history of palpitations.  She does not have any past history of documented atrial flutter.  She does not have any history of known ischemic heart disease.  He has had a past history of hypercholesterolemia.  She was in her usual state of health until 2 days ago when she underwent cataract surgery by Dr. Herbert Deaner at  the [facility].  Her daughter recalls that consideration was given to canceling the surgery because of the patient's fast heart rate noted at the time that she arrived.  However they proceeded to go ahead with surgery.  Surgery went well.  The patient has continued to have a racing heart since 2 days ago.  She called the office today with this history and we asked her to come in to be checked.  EKG shows atrial flutter with 21 response.  The patient is not having any chest pain but is feeling lightheaded.  Her blood pressure is adequately maintained.  Carotid sinus massage reveals that the heart slows transiently and then resumes heart rate in the 140s.  The patient has been lightheaded and feels like she might faint.  Home Medications  Prior to Admission  medications   Medication Sig Start Date End Date Taking? Authorizing Provider  aspirin EC 81 MG tablet Take 81 mg by mouth daily.   Yes Historical Provider, MD  Biotin 5 MG CAPS Take 1 capsule by mouth daily.     Yes Historical Provider, MD  bisoprolol (ZEBETA) 5 MG tablet TAKE 1 TABLET BY MOUTH TWICE DAILY 04/24/14  Yes Mosie Lukes, MD  Calcium Carbonate-Vitamin D (CALCIUM 600+D) 600-200 MG-UNIT TABS Take 1 tablet by mouth daily after breakfast. Dr. Justine Null    Yes Historical Provider, MD  citalopram (CELEXA) 10 MG tablet TAKE 1 TABLET BY MOUTH EVERY DAY 12/30/13  Yes Mosie Lukes, MD  Eszopiclone 3 MG TABS TAKE 1 TABLET BY MOUTH EVERY DAY AT BEDTIME AS NEEDED 12/30/13  Yes Mosie Lukes, MD  gabapentin (NEURONTIN) 600 MG tablet TAKE 1 TABLET BY MOUTH THREE TIMES DAILY 04/17/14  Yes Mosie Lukes, MD  hydrOXYzine (ATARAX/VISTARIL) 10 MG tablet TAKE 1 TABLET BY MOUTH EVERY 6 HOURS AS NEEDED FOR ITCHING OR ANXIETY 04/17/14  Yes Mosie Lukes, MD  hyoscyamine (LEVSIN SL) 0.125 MG SL tablet Take 1-2 tablets by mouth every 4 hours as needed for bloating and diarrhea 10/17/13  Yes Ladene Artist, MD  Magnesium 250 MG TABS Take 250 mg by mouth daily. Dr. Justine Null   Yes Historical Provider, MD  Multiple Vitamin (MULITIVITAMIN WITH MINERALS) TABS Take 1 tablet by mouth daily.   Yes Historical Provider, MD  NALTREXONE HCL PO Take by mouth daily.   Yes Historical Provider, MD  NEXIUM 40 MG capsule TAKE 1 CAPSULE BY MOUTH DAILY AT 2PM 04/04/14  Yes Mosie Lukes, MD  NON FORMULARY Take 1 capsule by mouth daily. Mega Red   Yes Historical Provider, MD  nortriptyline (PAMELOR) 25 MG capsule TAKE 2 CAPSULES BY MOUTH EVERY NIGHT AT BEDTIME 04/17/14  Yes Mosie Lukes, MD  ranitidine (ZANTAC) 300 MG tablet TAKE 1 TABLET BY MOUTH AT BEDTIME 12/30/13  Yes Mosie Lukes, MD  simvastatin (ZOCOR) 40 MG tablet TAKE 1 TABLET BY MOUTH EVERY EVENING 04/17/14  Yes Mosie Lukes, MD  torsemide (DEMADEX) 10 MG tablet TAKE 1 TABLET  BY MOUTH EVERY MORNING 04/24/14  Yes Mosie Lukes, MD  triamcinolone cream (KENALOG) 0.1 % Apply topically 2 (two) times daily as needed. pruritus 03/29/13  Yes Mosie Lukes, MD  ZETIA 10 MG tablet TAKE 1 TABLET BY MOUTH DAILY 04/07/14  Yes Mosie Lukes, MD  zoledronic acid (RECLAST) 5 MG/100ML SOLN Inject 5 mg into the vein once.   Yes Historical Provider, MD    Family History  Family History  Problem Relation  Age of Onset  . Bipolar disorder Mother   . Stomach cancer Mother   . Mental illness Mother   . Stroke Father   . Colon cancer Father   . Throat cancer Maternal Uncle     larynx  . HIV Son     Aids  . Anesthesia problems Neg Hx   . Stomach cancer Maternal Grandfather   . Stomach cancer Paternal Grandmother   . Anxiety disorder Mother   . Diabetes Neg Hx   . Heart disease Maternal Grandmother     Social History  History   Social History  . Marital Status: Divorced    Spouse Name: N/A    Number of Children: N/A  . Years of Education: N/A   Occupational History  . Not on file.   Social History Main Topics  . Smoking status: Former Smoker -- 4 years    Types: Cigarettes  . Smokeless tobacco: Never Used     Comment: quit 20+yrs ago  . Alcohol Use: 8.4 oz/week    14 Glasses of wine per week     Comment: WINE WITH DINNER. 2 GLASSES RED WINE  . Drug Use: No  . Sexual Activity: No   Other Topics Concern  . Not on file   Social History Narrative  . No narrative on file     Review of Systems General:  No chills, fever, night sweats or weight changes.  Cardiovascular:  No chest pain, dyspnea on exertion, edema, orthopnea, palpitations, paroxysmal nocturnal dyspnea. Dermatological: No rash, lesions/masses Respiratory: No cough, dyspnea Urologic: No hematuria, dysuria Abdominal:   No nausea, vomiting, diarrhea, bright red blood per rectum, melena, or hematemesis Neurologic:  No visual changes, wkns, changes in mental status. All other systems reviewed and  are otherwise negative except as noted above.  Physical Exam  Blood pressure 126/91, pulse 140, height 5\' 3"  (1.6 m), weight 161 lb (73.029 kg).  General: Pleasant, NAD, anxious.  Skin is slightly clammy. Psych: Normal affect. Neuro: Alert and oriented X 3. Moves all extremities spontaneously. HEENT: Normal  Neck: Supple without bruits or JVD. Lungs:  Resp regular and unlabored, CTA. Heart: Regular tachycardia.  There is transient slowing with carotid sinus massage. Abdomen: Soft, non-tender, non-distended, BS + x 4.  Extremities: No clubbing, cyanosis or edema. DP/PT/Radials 2+ and equal bilaterally.  Labs  Troponin (Point of Care Test) No results found for this basename: TROPIPOC,  in the last 72 hours No results found for this basename: CKTOTAL, CKMB, TROPONINI,  in the last 72 hours Lab Results  Component Value Date   WBC 5.1 02/10/2014   HGB 14.0 02/10/2014   HCT 41.6 02/10/2014   MCV 89.8 02/10/2014   PLT 253 02/10/2014   No results found for this basename: NA, K, CL, CO2, BUN, CREATININE, CALCIUM, LABALBU, PROT, BILITOT, ALKPHOS, ALT, AST, GLUCOSE,  in the last 168 hours Lab Results  Component Value Date   CHOL 170 02/10/2014   HDL 53 02/10/2014   LDLCALC 94 02/10/2014   TRIG 114 02/10/2014   No results found for this basename: DDIMER     Radiology/Studies  No results found.  ECG Atrial flutter with 2:1 response  ASSESSMENT AND PLAN  1. new-onset atrial flutter with 2 to one ventricular response. 2. Dyslipidemia 3. essential hypertension 4. past history of palpitations  Plan: Admit to telemetry.  Get 2-D echo.  Consider diltiazem drip.  Start anticoagulation.  Consider TEE cardioversion Friday if she fails to convert. Signed, Darlin Coco,  MD 05/04/2014, 3:20 PM

## 2014-05-04 NOTE — Progress Notes (Signed)
PHARMACIST - PHYSICIAN ORDER COMMUNICATION  CONCERNING: P&T Medication Policy on Herbal Medications  DESCRIPTION:  This patient's order for:  Biotin  has been noted.  This product(s) is classified as an "herbal" or natural product. Due to a lack of definitive safety studies or FDA approval, nonstandard manufacturing practices, plus the potential risk of unknown drug-drug interactions while on inpatient medications, the Pharmacy and Therapeutics Committee does not permit the use of "herbal" or natural products of this type within [facility].   ACTION TAKEN: The pharmacy department is unable to verify this order at this time and your patient has been informed of this safety policy. Please reevaluate patient's clinical condition at discharge and address if the herbal or natural product(s) should be resumed at that time.  Alycia Rossetti, PharmD, BCPS Clinical Pharmacist Pager: [phone] 05/04/2014 4:54 PM

## 2014-05-05 DIAGNOSIS — F341 Dysthymic disorder: Secondary | ICD-10-CM | POA: Diagnosis present

## 2014-05-05 DIAGNOSIS — I379 Nonrheumatic pulmonary valve disorder, unspecified: Secondary | ICD-10-CM | POA: Diagnosis not present

## 2014-05-05 DIAGNOSIS — Z8249 Family history of ischemic heart disease and other diseases of the circulatory system: Secondary | ICD-10-CM | POA: Diagnosis not present

## 2014-05-05 DIAGNOSIS — Z79899 Other long term (current) drug therapy: Secondary | ICD-10-CM | POA: Diagnosis not present

## 2014-05-05 DIAGNOSIS — Z96649 Presence of unspecified artificial hip joint: Secondary | ICD-10-CM | POA: Diagnosis not present

## 2014-05-05 DIAGNOSIS — E785 Hyperlipidemia, unspecified: Secondary | ICD-10-CM | POA: Diagnosis present

## 2014-05-05 DIAGNOSIS — Z823 Family history of stroke: Secondary | ICD-10-CM | POA: Diagnosis not present

## 2014-05-05 DIAGNOSIS — Z87891 Personal history of nicotine dependence: Secondary | ICD-10-CM | POA: Diagnosis not present

## 2014-05-05 DIAGNOSIS — Z800 Family history of malignant neoplasm of digestive organs: Secondary | ICD-10-CM | POA: Diagnosis not present

## 2014-05-05 DIAGNOSIS — K219 Gastro-esophageal reflux disease without esophagitis: Secondary | ICD-10-CM | POA: Diagnosis present

## 2014-05-05 DIAGNOSIS — Z981 Arthrodesis status: Secondary | ICD-10-CM | POA: Diagnosis not present

## 2014-05-05 DIAGNOSIS — Z91018 Allergy to other foods: Secondary | ICD-10-CM | POA: Diagnosis not present

## 2014-05-05 DIAGNOSIS — I4892 Unspecified atrial flutter: Principal | ICD-10-CM

## 2014-05-05 DIAGNOSIS — Z7901 Long term (current) use of anticoagulants: Secondary | ICD-10-CM | POA: Diagnosis not present

## 2014-05-05 DIAGNOSIS — I10 Essential (primary) hypertension: Secondary | ICD-10-CM | POA: Diagnosis not present

## 2014-05-05 DIAGNOSIS — M810 Age-related osteoporosis without current pathological fracture: Secondary | ICD-10-CM | POA: Diagnosis present

## 2014-05-05 DIAGNOSIS — E7800 Pure hypercholesterolemia, unspecified: Secondary | ICD-10-CM | POA: Diagnosis present

## 2014-05-05 DIAGNOSIS — Z818 Family history of other mental and behavioral disorders: Secondary | ICD-10-CM | POA: Diagnosis not present

## 2014-05-05 DIAGNOSIS — K589 Irritable bowel syndrome without diarrhea: Secondary | ICD-10-CM | POA: Diagnosis present

## 2014-05-05 LAB — BASIC METABOLIC PANEL
Anion gap: 14 (ref 5–15)
BUN: 20 mg/dL (ref 6–23)
CO2: 23 mEq/L (ref 19–32)
Calcium: 9 mg/dL (ref 8.4–10.5)
Chloride: 105 mEq/L (ref 96–112)
Creatinine, Ser: 0.89 mg/dL (ref 0.50–1.10)
GFR calc Af Amer: 71 mL/min — ABNORMAL LOW (ref >90)
GFR calc non Af Amer: 61 mL/min — ABNORMAL LOW (ref >90)
Glucose, Bld: 98 mg/dL (ref 70–99)
Potassium: 4.1 mEq/L (ref 3.7–5.3)
Sodium: 142 mEq/L (ref 137–147)

## 2014-05-05 LAB — CBC
HCT: 41 % (ref 36.0–46.0)
Hemoglobin: 13.8 g/dL (ref 12.0–15.0)
MCH: 30.7 pg (ref 26.0–34.0)
MCHC: 33.7 g/dL (ref 30.0–36.0)
MCV: 91.1 fL (ref 78.0–100.0)
Platelets: 234 10*3/uL (ref 150–400)
RBC: 4.5 MIL/uL (ref 3.87–5.11)
RDW: 13.6 % (ref 11.5–15.5)
WBC: 8.1 10*3/uL (ref 4.0–10.5)

## 2014-05-05 LAB — HEMOGLOBIN A1C
Hgb A1c MFr Bld: 6 % — ABNORMAL HIGH (ref <5.7)
Mean Plasma Glucose: 126 mg/dL — ABNORMAL HIGH (ref <117)

## 2014-05-05 LAB — LIPID PANEL
Cholesterol: 179 mg/dL (ref 0–200)
HDL: 47 mg/dL (ref >39)
LDL Cholesterol: 85 mg/dL (ref 0–99)
Total CHOL/HDL Ratio: 3.8 RATIO
Triglycerides: 235 mg/dL — ABNORMAL HIGH (ref <150)
VLDL: 47 mg/dL — ABNORMAL HIGH (ref 0–40)

## 2014-05-05 LAB — TROPONIN I
Troponin I: <0.3 ng/mL (ref <0.30)
Troponin I: <0.3 ng/mL (ref <0.30)

## 2014-05-05 LAB — T4, FREE: Free T4: 1.07 ng/dL (ref 0.80–1.80)

## 2014-05-05 MED ORDER — DILTIAZEM HCL ER COATED BEADS 180 MG PO CP24
180.0000 mg | ORAL_CAPSULE | Freq: Every day | ORAL | Status: DC
  Administered 2014-05-05 – 2014-05-06 (×2): 180 mg via ORAL
  Filled 2014-05-05 (×3): qty 1

## 2014-05-05 NOTE — Progress Notes (Signed)
UR Completed Lateef Juncaj Graves-Bigelow, RN,BSN [phone]  

## 2014-05-05 NOTE — Progress Notes (Addendum)
CARE MANAGEMENT NOTE 05/05/2014  Patient:  Chelsea Hancock, Chelsea Hancock   Account Number:  [id number]  Date Initiated:  05/05/2014  Documentation initiated by:  [facility]  Subjective/Objective Assessment:   rapid atrial flutter     Action/Plan:   Anticipated DC Date:  05/06/2014   Anticipated DC Plan:  [place]  CM consult  Medication Assistance      Choice offered to / List presented to:             Status of service:  Completed, signed off Medicare Important Message given?  YES (If response is "NO", the following Medicare IM given date fields will be blank) Date Medicare IM given:  05/05/2014 Medicare IM given by:  [facility] Date Additional Medicare IM given:   Additional Medicare IM given by:    Discharge Disposition:  HOME/SELF CARE  Per UR Regulation:    If discussed at Long Length of Stay Meetings, dates discussed:    Comments:  05/05/2014 1630 Signed copy of IM placed on chart. Copy given to pt. Jonnie Finner RN CCM Case Mgmt phone [phone]  05/05/2014 1245 NCM provided pt with Eliquis 30 day free trial card and information on her benefits for medication. (app out of pocket cost 180.00)  [employer] has assistance program that may assist pt with copay cost. Provided pt with contact number # [phone].  Pt states her secondary will assist with copay.   Jonnie Finner RN CCM Case Mgmt phone [phone]   S/W TENI @ [employer]  # [phone]  ELIQUIS 5 MG BID COVER- YES CO-PAY- BRAND -45 % AND GENERIC 65 % TIER- 3 DRUG PRIOR APPROVAL - YES # [phone] PHARMACY - ANY STANDARD

## 2014-05-05 NOTE — Progress Notes (Signed)
  Echocardiogram 2D Echocardiogram has been performed.  Meril Dray FRANCES 05/05/2014, 10:18 AM

## 2014-05-05 NOTE — Progress Notes (Signed)
PROGRESS NOTE  Subjective:   Chelsea Hancock is a 80 yo admitted with rapid atrial flutter.  Her ventricular rate is better.  She is on bisoprolol 5 bid and Dilt drip at 5 mg an hour.   She is basically asymptomatic. Going for echo   Objective:    Vital Signs:   Temp:  [97.5 F (36.4 C)-98.6 F (37 C)] 97.5 F (36.4 C) (08/14 0418) Pulse Rate:  [70-140] 70 (08/14 0418) Resp:  [18] 18 (08/14 0418) BP: (92-126)/(56-91) 92/56 mmHg (08/14 0418) SpO2:  [92 %-98 %] 95 % (08/14 0418) Weight:  [160 lb 15 oz (73 kg)-161 lb (73.029 kg)] 160 lb 15 oz (73 kg) (08/13 1638)  Last BM Date: 05/03/14   24-hour weight change: Weight change:   Weight trends: Filed Weights   05/04/14 1638  Weight: 160 lb 15 oz (73 kg)    Intake/Output:        Physical Exam: BP 92/56  Pulse 70  Temp(Src) 97.5 F (36.4 C) (Oral)  Resp 18  Ht 5\' 3"  (1.6 m)  Wt 160 lb 15 oz (73 kg)  BMI 28.52 kg/m2  SpO2 95%  Wt Readings from Last 3 Encounters:  05/04/14 160 lb 15 oz (73 kg)  05/04/14 161 lb (73.029 kg)  04/24/14 165 lb 6 oz (75.014 kg)    General: Vital signs reviewed and noted.   Head: Normocephalic, atraumatic.  Eyes: conjunctivae/corneas clear.  EOM's intact.   Throat: normal  Neck:  normal  Lungs:    clear   Heart:  Rr  Abdomen:  Soft, non-tender, non-distended    Extremities: Normal    Neurologic: A&O X3, CN II - XII are grossly intact.   Psych: Normal     Labs: BMET:  Recent Labs  05/04/14 1941 05/05/14 0120  NA 140 142  K 4.2 4.1  CL 102 105  CO2 22 23  GLUCOSE 191* 98  BUN 22 20  CREATININE 0.83 0.89  CALCIUM 8.9 9.0  MG 1.9  --     Liver function tests:  Recent Labs  05/04/14 1941  AST 22  ALT 19  ALKPHOS 91  BILITOT 0.3  PROT 6.7  ALBUMIN 3.7   No results found for this basename: LIPASE, AMYLASE,  in the last 72 hours  CBC:  Recent Labs  05/04/14 1941 05/05/14 0120  WBC 8.6 8.1  NEUTROABS 4.6  --   HGB 14.4 13.8  HCT 43.9 41.0    MCV 93.0 91.1  PLT 230 234    Cardiac Enzymes:  Recent Labs  05/04/14 1941 05/05/14 0120 05/05/14 0644  TROPONINI <0.30 <0.30 <0.30    Coagulation Studies:  Recent Labs  05/04/14 1941  LABPROT 13.8  INR 1.06    Other: No components found with this basename: POCBNP,  No results found for this basename: DDIMER,  in the last 72 hours  Recent Labs  05/04/14 1941  HGBA1C 6.0*    Recent Labs  05/05/14 0120  CHOL 179  HDL 47  LDLCALC 85  TRIG 235*  CHOLHDL 3.8    Recent Labs  05/04/14 1941  TSH 7.600*   No results found for this basename: VITAMINB12, FOLATE, FERRITIN, TIBC, IRON, RETICCTPCT,  in the last 72 hours   Other results:  EKG :  Atrial flutter with 4:1 AV block     Medications:    Infusions: . diltiazem (CARDIZEM) infusion 5 mg/hr (05/05/14 0237)    Scheduled Medications: . apixaban  5  mg Oral BID  . atorvastatin  20 mg Oral q1800  . bisoprolol  5 mg Oral BID  . brimonidine  1 drop Right Eye BID  . calcium-vitamin D  1 tablet Oral Q breakfast  . citalopram  10 mg Oral Daily  . Difluprednate  1 drop Right Eye TID  . diltiazem  180 mg Oral Daily  . ezetimibe  10 mg Oral QPM  . famotidine  40 mg Oral QHS  . gabapentin  600 mg Oral TID  . hydrOXYzine  20 mg Oral QHS  . multivitamin with minerals  1 tablet Oral Daily  . NONFORMULARY OR COMPOUNDED ITEM   Oral Daily  . nortriptyline  50 mg Oral QHS  . ofloxacin  1 drop Right Eye QID  . pantoprazole  80 mg Oral QAC supper  . sodium chloride  3 mL Intravenous Q12H  . torsemide  10 mg Oral Daily    Assessment/ Plan:    1. Atrial flutter;  She is basically asymptomatic.  i would prefer that she have 5 doses of Eliquis before getting cardioverted.  Her rate is well controlled and she is asymptomatic.  Will change her from Iv to PO dilt and continue eliquis.   If her HR remains controlled on the PO dilt and bisoprolol , she can go home tomorrow and see our PA or NP and be set up for  cardioversion .  We can do a TEE/CV if she has symptoms or we can wait 4 weeks and then do CV ( after 4 weeks of theraputic anticoagulation)    Will give her a heart healthy diet.  2. HTN:  Stable    Disposition: *Length of Stay: 1  Thayer Headings, Brooke Bonito., MD, [facility] 05/05/2014, 9:33 AM Office [phone] Pager [phone]

## 2014-05-06 MED ORDER — DIGOXIN 250 MCG PO TABS
0.2500 mg | ORAL_TABLET | Freq: Every day | ORAL | Status: DC
  Administered 2014-05-06: 0.25 mg via ORAL
  Filled 2014-05-06 (×2): qty 1

## 2014-05-06 MED ORDER — DIGOXIN 0.25 MG/ML IJ SOLN
0.2500 mg | Freq: Four times a day (QID) | INTRAMUSCULAR | Status: AC
  Administered 2014-05-06 (×2): 0.25 mg via INTRAVENOUS
  Filled 2014-05-06 (×3): qty 1

## 2014-05-06 NOTE — Progress Notes (Signed)
Patient converted to sinus tach. Strip saved. Patient asymptomatic. Will continue to monitor.   Domingo Dimes RN

## 2014-05-06 NOTE — Progress Notes (Signed)
PROGRESS NOTE  Subjective:   Chelsea Hancock is a 80 yo admitted with rapid atrial flutter.  Her ventricular rate is better.  She is on bisoprolol 5 bid and Dilt drip at 5 mg an hour.   She is basically asymptomatic.  Echo showed normal left ventricle systolic function with an ejection fraction of 55-60%.  His mild tricuspid regurgitation.  She's more tachycardic today than she was yesterday.  Objective:    Vital Signs:   Temp:  [97.8 F (36.6 C)-97.9 F (36.6 C)] 97.8 F (36.6 C) (08/15 0555) Pulse Rate:  [66-132] 132 (08/15 0555) Resp:  [16] 16 (08/15 0555) BP: (99-112)/(62-74) 99/74 mmHg (08/15 0555) SpO2:  [93 %-98 %] 98 % (08/15 0555)  Last BM Date: 05/05/14   24-hour weight change: Weight change:   Weight trends: Filed Weights   05/04/14 1638  Weight: 160 lb 15 oz (73 kg)    Intake/Output:  08/14 0701 - 08/15 0700 In: 240 [P.O.:240] Out: -  Total I/O In: 240 [P.O.:240] Out: -    Physical Exam: BP 99/74  Pulse 132  Temp(Src) 97.8 F (36.6 C) (Oral)  Resp 16  Ht 5\' 3"  (1.6 m)  Wt 160 lb 15 oz (73 kg)  BMI 28.52 kg/m2  SpO2 98%  Wt Readings from Last 3 Encounters:  05/04/14 160 lb 15 oz (73 kg)  05/04/14 161 lb (73.029 kg)  04/24/14 165 lb 6 oz (75.014 kg)    General: Vital signs reviewed and noted.   Head: Normocephalic, atraumatic.  Eyes: conjunctivae/corneas clear.  EOM's intact.   Throat: normal  Neck:  normal  Lungs:    clear   Heart:  Rr. Tachycardic   Abdomen:  Soft, non-tender, non-distended    Extremities: Normal    Neurologic: A&O X3, CN II - XII are grossly intact.   Psych: Normal     Labs: BMET:  Recent Labs  05/04/14 1941 05/05/14 0120  NA 140 142  K 4.2 4.1  CL 102 105  CO2 22 23  GLUCOSE 191* 98  BUN 22 20  CREATININE 0.83 0.89  CALCIUM 8.9 9.0  MG 1.9  --     Liver function tests:  Recent Labs  05/04/14 1941  AST 22  ALT 19  ALKPHOS 91  BILITOT 0.3  PROT 6.7  ALBUMIN 3.7   No results  found for this basename: LIPASE, AMYLASE,  in the last 72 hours  CBC:  Recent Labs  05/04/14 1941 05/05/14 0120  WBC 8.6 8.1  NEUTROABS 4.6  --   HGB 14.4 13.8  HCT 43.9 41.0  MCV 93.0 91.1  PLT 230 234    Cardiac Enzymes:  Recent Labs  05/04/14 1941 05/05/14 0120 05/05/14 0644  TROPONINI <0.30 <0.30 <0.30    Coagulation Studies:  Recent Labs  05/04/14 1941  LABPROT 13.8  INR 1.06    Other: No components found with this basename: POCBNP,  No results found for this basename: DDIMER,  in the last 72 hours  Recent Labs  05/04/14 1941  HGBA1C 6.0*    Recent Labs  05/05/14 0120  CHOL 179  HDL 47  LDLCALC 85  TRIG 235*  CHOLHDL 3.8    Recent Labs  05/04/14 1941  TSH 7.600*   No results found for this basename: VITAMINB12, FOLATE, FERRITIN, TIBC, IRON, RETICCTPCT,  in the last 72 hours   Other results:  EKG :  Atrial flutter with 2:1 AV block     Medications:  Infusions:    Scheduled Medications: . apixaban  5 mg Oral BID  . atorvastatin  20 mg Oral q1800  . bisoprolol  5 mg Oral BID  . brimonidine  1 drop Right Eye BID  . calcium-vitamin D  1 tablet Oral Q breakfast  . citalopram  10 mg Oral Daily  . Difluprednate  1 drop Right Eye TID  . diltiazem  180 mg Oral Daily  . ezetimibe  10 mg Oral QPM  . famotidine  40 mg Oral QHS  . gabapentin  600 mg Oral TID  . hydrOXYzine  20 mg Oral QHS  . multivitamin with minerals  1 tablet Oral Daily  . Naltrexone 5 mg capsule (Compounded by [facility])   Oral Daily  . nortriptyline  50 mg Oral QHS  . ofloxacin  1 drop Right Eye QID  . pantoprazole  80 mg Oral QAC supper  . sodium chloride  3 mL Intravenous Q12H  . torsemide  10 mg Oral Daily    Assessment/ Plan:    1. Atrial flutter;  She is basically asymptomatic.  i would prefer that she have 5 doses of Eliquis before getting cardioverted.    Her heart it is not as well controlled today on the by mouth diltiazem compared to  the IV diltiazem.Marland Kitchen  Her blood pressure is marginal. We'll discontinue the torsemide to allow her blood pressure to go up slightly.  We will add Digoxin to help slow her HR while we allow her BP to go up  If her HR remains controlled on the PO dilt and bisoprolol , she can go home tomorrow and see our PA or NP and be set up for cardioversion .  We can do a TEE/CV if she has symptoms or we can wait 4 weeks and then do CV ( after 4 weeks of theraputic anticoagulation)    Will give her a heart healthy diet.  2. HTN:  Stable   Disposition: *Length of Stay: 2  Ramond Dial., MD, [facility] 05/06/2014, 12:17 PM Office [phone] Pager [phone]

## 2014-05-07 ENCOUNTER — Encounter: Payer: Self-pay | Admitting: Cardiovascular Disease

## 2014-05-07 DIAGNOSIS — E785 Hyperlipidemia, unspecified: Secondary | ICD-10-CM | POA: Diagnosis present

## 2014-05-07 DIAGNOSIS — I10 Essential (primary) hypertension: Secondary | ICD-10-CM

## 2014-05-07 MED ORDER — APIXABAN 5 MG PO TABS: 5.0000 mg | ORAL_TABLET | Freq: Two times a day (BID) | ORAL | Status: DC

## 2014-05-07 MED ORDER — ACETAMINOPHEN 325 MG PO TABS: 650.0000 mg | ORAL_TABLET | ORAL | Status: DC | PRN

## 2014-05-07 MED ORDER — DILTIAZEM HCL ER COATED BEADS 180 MG PO CP24: 180.0000 mg | ORAL_CAPSULE | Freq: Every day | ORAL | Status: DC

## 2014-05-07 MED ORDER — DILTIAZEM HCL ER COATED BEADS 120 MG PO CP24: 120.0000 mg | ORAL_CAPSULE | Freq: Every day | ORAL | Status: DC

## 2014-05-07 NOTE — Discharge Summary (Signed)
Patient ID: Chelsea Hancock,  MRN: [id number], DOB/AGE: 1934-04-03 80 y.o.  Admit date: 05/04/2014 Discharge date: 05/07/2014  Primary Care Provider: Dr Chauncey Cruel. [place] Primary Cardiologist: Dr [name]  Discharge Diagnoses Principal Problem:   Atrial flutter Active Problems:   GERD   Irritable bowel syndrome   FIBROMYALGIA   Depression with anxiety   Dyslipidemia    Hospital Course:  80 year old woman is seen by Dr [name] in the office 05/04/14 as an urgent work in for palpitations and tachycardia. We had not seen her here in the office since 07/15/12. She has a past history of dyslipidemia and a past history of palpitations. She does not have any past history of documented atrial flutter. She does not have any history of known ischemic heart disease. He has had a past history of hypercholesterolemia. The pt had recent cataract surgery patient has continued to have a racing heart since 8/9. She called the office on the 13th with this history and we asked her to come in to be checked. EKG showed atrial flutter with 2:1 VR response. The patient was pain but was feeling lightheaded. Her blood pressure was adequately maintained. Carotid sinus massage reveals that the heart slows transiently and then resumes heart rate in the 140s. The patient has been lightheaded and feels like she might faint. She was admitted to telemretry for further evaluation. With the addition of Diltiazem and beta blocker her rate come under control. She was started on Eliquis 5 mg BID. Echo revealed normal LVF. Dr Acie Fredrickson saw he on the morning of the 16 th and felt she could be discharged. Her B/P was soft at discharge- 100/60- and her Diltiazem was decreased to 120 mg daily. She'll follow up with Dr [name] or an APP in 2-3 weeks.    Discharge Vitals:  Blood pressure 101/50, pulse 57, temperature 97.6 F (36.4 C), temperature source Oral, resp. rate 18, height 5\' 3"  (1.6 m), weight 160 lb 15 oz (73 kg), SpO2  100.00%.    Labs: No results found for this or any previous visit (from the past 24 hour(s)).  Disposition:      Follow-up Information   Follow up with Darlin Coco, MD. (office will call you)    Specialty:  Cardiology   Contact information:   [address] [place] [address] [phone]       Discharge Medications:    Medication List    STOP taking these medications       aspirin EC 81 MG tablet      TAKE these medications       acetaminophen 325 MG tablet  Commonly known as:  TYLENOL  Take 2 tablets (650 mg total) by mouth every 4 (four) hours as needed for headache or mild pain.     apixaban 5 MG Tabs tablet  Commonly known as:  ELIQUIS  Take 1 tablet (5 mg total) by mouth 2 (two) times daily.     Biotin 5 MG Caps  Take 1 capsule by mouth daily.     bisoprolol 5 MG tablet  Commonly known as:  ZEBETA  Take 5 mg by mouth 2 (two) times daily.     brimonidine 0.1 % Soln  Commonly known as:  ALPHAGAN P  Place 1 drop into the right eye 2 (two) times daily.     CALCIUM 600+D 600-200 MG-UNIT Tabs  Generic drug:  Calcium Carbonate-Vitamin D  - Take 1 tablet by mouth daily after breakfast.  Dr. Justine Null  -      citalopram 10 MG tablet  Commonly known as:  CELEXA  Take 10 mg by mouth daily.     clobetasol ointment 0.05 %  Commonly known as:  TEMOVATE  Apply 1 application topically 2 (two) times daily as needed (for itching).     diltiazem 180 MG 24 hr capsule  Commonly known as:  CARDIZEM CD  Take 1 capsule (180 mg total) by mouth daily.     DUREZOL 0.05 % Emul  Generic drug:  Difluprednate  Place 1 drop into the right eye 3 (three) times daily.     esomeprazole 40 MG capsule  Commonly known as:  NEXIUM  Take 40 mg by mouth every evening.     eszopiclone 3 MG Tabs  Generic drug:  Eszopiclone  Take 3 mg by mouth at bedtime. Take immediately before bedtime     ezetimibe 10 MG tablet  Commonly known as:  ZETIA  Take 10 mg by mouth  every evening.     gabapentin 600 MG tablet  Commonly known as:  NEURONTIN  Take 600 mg by mouth 3 (three) times daily.     hydrOXYzine 10 MG tablet  Commonly known as:  ATARAX/VISTARIL  Take 20 mg by mouth at bedtime.     loperamide 2 MG capsule  Commonly known as:  IMODIUM  Take 2 mg by mouth as needed for diarrhea or loose stools.     multivitamin with minerals Tabs tablet  Take 1 tablet by mouth daily.     NALTREXONE HCL PO  Take 5 mg by mouth every morning. *made at [place]  Take 1 capsule by mouth daily. Mega [employer]     nortriptyline 25 MG capsule  Commonly known as:  PAMELOR  Take 50 mg by mouth at bedtime.     ofloxacin 0.3 % ophthalmic solution  Commonly known as:  OCUFLOX  Place 1 drop into the right eye 4 (four) times daily.     ranitidine 300 MG tablet  Commonly known as:  ZANTAC  Take 300 mg by mouth at bedtime.     RECLAST 5 MG/100ML Soln injection  Generic drug:  zoledronic acid  Inject 5 mg into the vein once.     simvastatin 40 MG tablet  Commonly known as:  ZOCOR  Take 40 mg by mouth every evening.     torsemide 10 MG tablet  Commonly known as:  DEMADEX  Take 10 mg by mouth daily.     triamcinolone cream 0.1 %  Commonly known as:  KENALOG  Apply 1 application topically 2 (two) times daily as needed (for pruitus).         Duration of Discharge Encounter: Greater than 30 minutes including physician time.  Angelena Form PA-C 05/07/2014 11:22 AM

## 2014-05-07 NOTE — Progress Notes (Signed)
DC IV and tele per MD orders and protocol; DC instructions reviewed with patient and daughter at bedside; no further questions from pt or daughter.  Rowe Pavy, RN

## 2014-05-07 NOTE — Progress Notes (Signed)
PROGRESS NOTE  Subjective:   Chelsea Hancock is a 80 yo admitted with rapid atrial flutter.  Her ventricular rate is better.  She is on bisoprolol 5 bid and Dilt drip at 5 mg an hour.   She is basically asymptomatic.  Echo showed normal left ventricle systolic function with an ejection fraction of 55-60%.  His mild tricuspid regurgitation.  She's converted to NSR .  A little lightheadedness.   Objective:    Vital Signs:   Temp:  [97.6 F (36.4 C)-98.5 F (36.9 C)] 97.6 F (36.4 C) (08/16 0428) Pulse Rate:  [51-130] 51 (08/16 0428) Resp:  [18] 18 (08/16 0428) BP: (101-132)/(55-73) 132/59 mmHg (08/16 0428) SpO2:  [95 %-100 %] 100 % (08/16 0428)  Last BM Date: 05/06/14   24-hour weight change: Weight change:   Weight trends: Filed Weights   05/04/14 1638  Weight: 160 lb 15 oz (73 kg)    Intake/Output:  08/15 0701 - 08/16 0700 In: 480 [P.O.:480] Out: -  Total I/O In: 240 [P.O.:240] Out: -    Physical Exam: BP 132/59  Pulse 51  Temp(Src) 97.6 F (36.4 C) (Oral)  Resp 18  Ht 5\' 3"  (1.6 m)  Wt 160 lb 15 oz (73 kg)  BMI 28.52 kg/m2  SpO2 100%  Wt Readings from Last 3 Encounters:  05/04/14 160 lb 15 oz (73 kg)  05/04/14 161 lb (73.029 kg)  04/24/14 165 lb 6 oz (75.014 kg)    General: Vital signs reviewed and noted.   Head: Normocephalic, atraumatic.  Eyes: conjunctivae/corneas clear.  EOM's intact.   Throat: normal  Neck:  normal  Lungs:    clear   Cor RR,   Abdomen:  Soft, non-tender, non-distended    Extremities: Normal    Neurologic: A&O X3, CN II - XII are grossly intact.   Psych: Normal     Labs: BMET:  Recent Labs  05/04/14 1941 05/05/14 0120  NA 140 142  K 4.2 4.1  CL 102 105  CO2 22 23  GLUCOSE 191* 98  BUN 22 20  CREATININE 0.83 0.89  CALCIUM 8.9 9.0  MG 1.9  --     Liver function tests:  Recent Labs  05/04/14 1941  AST 22  ALT 19  ALKPHOS 91  BILITOT 0.3  PROT 6.7  ALBUMIN 3.7   No results found for this  basename: LIPASE, AMYLASE,  in the last 72 hours  CBC:  Recent Labs  05/04/14 1941 05/05/14 0120  WBC 8.6 8.1  NEUTROABS 4.6  --   HGB 14.4 13.8  HCT 43.9 41.0  MCV 93.0 91.1  PLT 230 234    Cardiac Enzymes:  Recent Labs  05/04/14 1941 05/05/14 0120 05/05/14 0644  TROPONINI <0.30 <0.30 <0.30    Coagulation Studies:  Recent Labs  05/04/14 1941  LABPROT 13.8  INR 1.06    Other: No components found with this basename: POCBNP,  No results found for this basename: DDIMER,  in the last 72 hours  Recent Labs  05/04/14 1941  HGBA1C 6.0*    Recent Labs  05/05/14 0120  CHOL 179  HDL 47  LDLCALC 85  TRIG 235*  CHOLHDL 3.8    Recent Labs  05/04/14 1941  TSH 7.600*   No results found for this basename: VITAMINB12, FOLATE, FERRITIN, TIBC, IRON, RETICCTPCT,  in the last 72 hours   Other results:  EKG :  NSR at 63    Medications:    Infusions:  Scheduled Medications: . apixaban  5 mg Oral BID  . atorvastatin  20 mg Oral q1800  . bisoprolol  5 mg Oral BID  . brimonidine  1 drop Right Eye BID  . calcium-vitamin D  1 tablet Oral Q breakfast  . citalopram  10 mg Oral Daily  . Difluprednate  1 drop Right Eye TID  . digoxin  0.25 mg Oral Daily  . diltiazem  180 mg Oral Daily  . ezetimibe  10 mg Oral QPM  . famotidine  40 mg Oral QHS  . gabapentin  600 mg Oral TID  . hydrOXYzine  20 mg Oral QHS  . multivitamin with minerals  1 tablet Oral Daily  . Naltrexone 5 mg capsule (Compounded by [facility])   Oral Daily  . nortriptyline  50 mg Oral QHS  . ofloxacin  1 drop Right Eye QID  . pantoprazole  80 mg Oral QAC supper  . sodium chloride  3 mL Intravenous Q12H    Assessment/ Plan:    1. Atrial flutter;   2. HTN:  Stable   Disposition: *Length of Stay: 3  Thayer Headings, Brooke Bonito., MD, [facility] 05/07/2014, 10:48 AM Office [phone] Pager [phone]

## 2014-05-07 NOTE — Progress Notes (Signed)
`     PROGRESS NOTE  Subjective:   Chelsea Hancock is a 80 yo admitted with rapid atrial flutter.  Her ventricular rate is better.  She is on bisoprolol 5 bid and Dilt drip at 5 mg an hour.   She is basically asymptomatic.  Echo showed normal left ventricle systolic function with an ejection fraction of 55-60%.  His mild tricuspid regurgitation.  She's more tachycardic today than she was yesterday.  Objective:    Vital Signs:   Temp:  [97.6 F (36.4 C)-98.5 F (36.9 C)] 97.6 F (36.4 C) (08/16 0428) Pulse Rate:  [51-130] 51 (08/16 0428) Resp:  [18] 18 (08/16 0428) BP: (101-132)/(55-73) 132/59 mmHg (08/16 0428) SpO2:  [95 %-100 %] 100 % (08/16 0428)  Last BM Date: 05/06/14   24-hour weight change: Weight change:   Weight trends: Filed Weights   05/04/14 1638  Weight: 160 lb 15 oz (73 kg)    Intake/Output:  08/15 0701 - 08/16 0700 In: 480 [P.O.:480] Out: -  Total I/O In: 240 [P.O.:240] Out: -    Physical Exam: BP 132/59  Pulse 51  Temp(Src) 97.6 F (36.4 C) (Oral)  Resp 18  Ht 5\' 3"  (1.6 m)  Wt 160 lb 15 oz (73 kg)  BMI 28.52 kg/m2  SpO2 100%  Wt Readings from Last 3 Encounters:  05/04/14 160 lb 15 oz (73 kg)  05/04/14 161 lb (73.029 kg)  04/24/14 165 lb 6 oz (75.014 kg)    General: Vital signs reviewed and noted.   Head: Normocephalic, atraumatic.  Eyes: conjunctivae/corneas clear.  EOM's intact.   Throat: normal  Neck:  normal  Lungs:    clear   Heart:  Rr. Tachycardic   Abdomen:  Soft, non-tender, non-distended    Extremities: Normal    Neurologic: A&O X3, CN II - XII are grossly intact.   Psych: Normal     Labs: BMET:  Recent Labs  05/04/14 1941 05/05/14 0120  NA 140 142  K 4.2 4.1  CL 102 105  CO2 22 23  GLUCOSE 191* 98  BUN 22 20  CREATININE 0.83 0.89  CALCIUM 8.9 9.0  MG 1.9  --     Liver function tests:  Recent Labs  05/04/14 1941  AST 22  ALT 19  ALKPHOS 91  BILITOT 0.3  PROT 6.7  ALBUMIN 3.7   No  results found for this basename: LIPASE, AMYLASE,  in the last 72 hours  CBC:  Recent Labs  05/04/14 1941 05/05/14 0120  WBC 8.6 8.1  NEUTROABS 4.6  --   HGB 14.4 13.8  HCT 43.9 41.0  MCV 93.0 91.1  PLT 230 234    Cardiac Enzymes:  Recent Labs  05/04/14 1941 05/05/14 0120 05/05/14 0644  TROPONINI <0.30 <0.30 <0.30    Coagulation Studies:  Recent Labs  05/04/14 1941  LABPROT 13.8  INR 1.06    Other: No components found with this basename: POCBNP,  No results found for this basename: DDIMER,  in the last 72 hours  Recent Labs  05/04/14 1941  HGBA1C 6.0*    Recent Labs  05/05/14 0120  CHOL 179  HDL 47  LDLCALC 85  TRIG 235*  CHOLHDL 3.8    Recent Labs  05/04/14 1941  TSH 7.600*   No results found for this basename: VITAMINB12, FOLATE, FERRITIN, TIBC, IRON, RETICCTPCT,  in the last 72 hours   Other results:  EKG :  Atrial flutter with 2:1 AV block  Medications:    Infusions:    Scheduled Medications: . apixaban  5 mg Oral BID  . atorvastatin  20 mg Oral q1800  . bisoprolol  5 mg Oral BID  . brimonidine  1 drop Right Eye BID  . calcium-vitamin D  1 tablet Oral Q breakfast  . citalopram  10 mg Oral Daily  . Difluprednate  1 drop Right Eye TID  . digoxin  0.25 mg Oral Daily  . diltiazem  180 mg Oral Daily  . ezetimibe  10 mg Oral QPM  . famotidine  40 mg Oral QHS  . gabapentin  600 mg Oral TID  . hydrOXYzine  20 mg Oral QHS  . multivitamin with minerals  1 tablet Oral Daily  . Naltrexone 5 mg capsule (Compounded by [facility])   Oral Daily  . nortriptyline  50 mg Oral QHS  . ofloxacin  1 drop Right Eye QID  . pantoprazole  80 mg Oral QAC supper  . sodium chloride  3 mL Intravenous Q12H    Assessment/ Plan:    1. Atrial flutter;  She is basically asymptomatic.  She has converted to sinus rhythm. Continue the same medications except I will discontinue the digoxin. Continue with the bisoprolol and diltiazem at the  current dose.  She would like to go back to work on Wednesday. We will write her a note giving her the okay. She'll return to our office to see either at the physician's assistant, nurse practitioner, or Dr. [name] in 2-3 weeks. She'll call sooner if she has problems. It will  be important for her to ambulate in the hallways prior to discharge.  2. HTN:  Stable   Disposition: dc to home    Thayer Headings, Brooke Bonito., MD, [facility] 05/07/2014, 10:37 AM Office [phone] Pager [phone]

## 2014-05-07 NOTE — Discharge Instructions (Signed)
Information on my medicine - ELIQUIS (apixaban)  This medication education was reviewed with me or my healthcare representative as part of my discharge preparation.  The pharmacist that spoke with me during my hospital stay was:  Sharmin Foulk, [place], [facility]  Why was Eliquis prescribed for you? Eliquis was prescribed for you to reduce the risk of a blood clot forming that can cause a stroke if you have a medical condition called atrial fibrillation (a type of irregular heartbeat).  What do You need to know about Eliquis ? Take your Eliquis TWICE DAILY - one tablet in the morning and one tablet in the evening with or without food. If you have difficulty swallowing the tablet whole please discuss with your pharmacist how to take the medication safely.  Take Eliquis exactly as prescribed by your doctor and DO NOT stop taking Eliquis without talking to the doctor who prescribed the medication.  Stopping may increase your risk of developing a stroke.  Refill your prescription before you run out.  After discharge, you should have regular check-up appointments with your healthcare provider that is prescribing your Eliquis.  In the future your dose may need to be changed if your kidney function or weight changes by a significant amount or as you get older.  What do you do if you miss a dose? If you miss a dose, take it as soon as you remember on the same day and resume taking twice daily.  Do not take more than one dose of ELIQUIS at the same time to make up a missed dose.  Important Safety Information A possible side effect of Eliquis is bleeding. You should call your healthcare provider right away if you experience any of the following:   Bleeding from an injury or your nose that does not stop.   Unusual colored urine (red or dark brown) or unusual colored stools (red or black).   Unusual bruising for unknown reasons.   A serious fall or if you hit your head (even if there is no  bleeding).  Some medicines may interact with Eliquis and might increase your risk of bleeding or clotting while on Eliquis. To help avoid this, consult your healthcare provider or pharmacist prior to using any new prescription or non-prescription medications, including herbals, vitamins, non-steroidal anti-inflammatory drugs (NSAIDs) and supplements.  This website has more information on Eliquis (apixaban): http://www.eliquis.com/eliquis/home  Atrial Flutter Atrial flutter is a heart rhythm that can cause the heart to beat very fast (tachycardia). It originates in the upper chambers of the heart (atria). In atrial flutter, the top chambers of the heart (atria) often beat much faster than the bottom chambers of the heart (ventricles). Atrial flutter has a regular "saw toothed" appearance in an EKG readout. An EKG is a test that records the electrical activity of the heart. Atrial flutter can cause the heart to beat up to 150 beats per minute (BPM). Atrial flutter can either be short lived (paroxysmal) or permanent.  CAUSES  Causes of atrial flutter can be many. Some of these include:  Heart related issues:  Heart attack (myocardial infarction).  Heart failure.  Heart valve problems.  Poorly controlled high blood pressure (hypertension).  After open heart surgery.  Lung related issues:  A blood clot in the lungs (pulmonary embolism).  Chronic obstructive pulmonary disease (COPD). Medications used to treat COPD can attribute to atrial flutter.  Other related causes:  Hyperthyroidism.  Caffeine.  Some decongestant cold medications.  Low electrolyte levels such as potassium  or magnesium.  Cocaine. SYMPTOMS  An awareness of your heart beating rapidly (palpitations).  Shortness of breath.  Chest pain.  Low blood pressure (hypotension).  Dizziness or fainting. DIAGNOSIS  Different tests can be performed to diagnose atrial flutter.   An EKG.  Holter monitor. This is  a 24-hour recording of your heart rhythm. You will also be given a diary. Write down all symptoms that you have and what you were doing at the time you experienced symptoms.  Cardiac event monitor. This small device can be worn for up to 30 days. When you have heart symptoms, you will push a button on the device. This will then record your heart rhythm.  Echocardiogram. This is an imaging test to look at your heart. Your caregiver will look at your heart valves and the ventricles.  Stress test. This test can help determine if the atrial flutter is related to exercise or if coronary artery disease is present.  Laboratory studies will look at certain blood levels like:  Complete blood count (CBC).  Potassium.  Magnesium.  Thyroid function. TREATMENT  Treatment of atrial flutter varies. A combination of therapies may be used or sometimes atrial flutter may need only 1 type of treatment.  Lab work: If your blood work, such as your electrolytes (potassium, magnesium) or your thyroid function tests, are abnormal, your caregiver will treat them accordingly.  Medication:  There are several different types of medications that can convert your heart to a normal rhythm and prevent atrial flutter from reoccurring.  Nonsurgical procedures: Nonsurgical techniques may be used to control atrial flutter. Some examples include:  Cardioversion. This technique uses either drugs or an electrical shock to restore a normal heart rhythm:  Cardioversion drugs may be given through an intravenous (IV) line to help "reset" the heart rhythm.  In electrical cardioversion, your caregiver shocks your heart with electrical energy. This helps to reset the heartbeat to a normal rhythm.  Ablation. If atrial flutter is a persistent problem, an ablation may be needed. This procedure is done under mild sedation. High frequency radio-wave energy is used to destroy the area of heart tissue responsible for atrial  flutter. SEEK IMMEDIATE MEDICAL CARE IF:  You have:  Dizziness.  Near fainting or fainting.  Shortness of breath.  Chest pain or pressure.  Sudden nausea or vomiting.  Profuse sweating. If you have the above symptoms, call your local emergency service immediately! Do not drive yourself to the hospital. MAKE SURE YOU:   Understand these instructions.  Will watch your condition.  Will get help right away if you are not doing well or get worse. Document Released: 01/25/2009 Document Revised: 01/23/2014 Document Reviewed: 01/25/2009 [facility] Patient Information 2015 [place], [place]. This information is not intended to replace advice given to you by your health care provider. Make sure you discuss any questions you have with your health care provider.

## 2014-05-08 ENCOUNTER — Other Ambulatory Visit: Payer: Self-pay | Admitting: Family Medicine

## 2014-05-08 NOTE — Discharge Summary (Signed)
See my note from day of discharge  Ramond Dial., MD, [facility] 05/08/2014, 5:47 PM [address],  [place] Pager [phone]

## 2014-05-08 NOTE — Telephone Encounter (Signed)
Rx sent to pharmacy. LDM 

## 2014-05-09 ENCOUNTER — Telehealth: Payer: Self-pay | Admitting: Cardiology

## 2014-05-09 NOTE — H&P (Signed)
13/2015 2:30 PM   Office Visit  MRN:  [id number]   Description: 80 year old female  Provider: Darlin Coco, MD  Department: [address] [facility]          Vital Signs Most recent update: 05/04/2014  2:58 PM by Claude Manges, CMA      BP Pulse Ht Wt BMI      126/91 140 5\' 3"  (1.6 m) 161 lb (73.029 kg) 28.53 kg/m2             Progress Notes      Darlin Coco, MD at 05/04/2014  3:19 PM      Status: Signed                 Patient ID: Chelsea Hancock MRN: [id number], DOB/AGE: 1933-12-03    Admit date: (Not on file)     Primary Physician: Penni Homans, MD Primary Cardiologist: Dr. [name]   Pt. Profile:   80 year old woman with new onset atrial flutter   Problem List    Past Medical History   Diagnosis  Date   .  Hyperlipidemia         takes Zetia and Simvastatin nightly   .  Palpitation     .  Carotid bruit         Right   .  Edema     .  Arthritis     .  PONV (postoperative nausea and vomiting)     .  Hyperlipidemia  01/27/2012   .  Allergy         portabella mushrooms/diarrhea   .  Heart murmur     .  Osteoporosis     .  Fibromyalgia     .  Thrush  03/23/2012   .  Vaginitis  03/23/2012   .  Sigmoid colon ulcer  03/23/2012   .  Migraine  04/27/2012   .  Hyperglycemia  04/27/2012   .  Dysrhythmia         paroxysmal A fib, irregular heartbeat;takes Bisoprolol nightly   .  History of migraine         last about 16yrs ago   .  Foot drop, right     .  Joint pain     .  Joint swelling     .  Osteoporosis     .  Chronic back pain     .  Skin problem in pregnancy         takes gabapentin 3 times a day and also has an ointment   .  GERD (gastroesophageal reflux disease)         takes Zantac and Nexium daily   .  Hemorrhoids     .  Diverticulosis     .  IBS (irritable bowel syndrome)         takes align daily   .  Cataracts, bilateral     .  Hypocalcemia  03/29/2013   .  Depression with anxiety  03/29/2013   .  Tachycardia  07/03/2013   .   Onychomycosis  07/03/2013   .  Adenomatous polyp of colon  1991, 2008       Past Surgical History   Procedure  Laterality  Date   .  Abdominal hysterectomy    1981   .  Lumbar laminectomy    1960       x 2   .  Veins stripped    1970   .  Total hip arthroplasty  Left  2012   .  Carpal tunnel release  Left         ulnar nerve release at elbow   .  Cervical fusion    2003, 2005   .  Back surgery    1997   .  Tonsillectomy    1943   .  Left elbow surgery    06/03/11   .  Colonoscopy       .  Posterior cervical fusion/foraminotomy    08/03/2012       Procedure: POSTERIOR CERVICAL FUSION/FORAMINOTOMY LEVEL 5;  Surgeon: [name], MD;  Location: [facility];  Service: Neurosurgery;  Laterality: N/A;  Cervical four to Thoracic two Posterior cervical decompression with Facet and Pedicle screw fixation   .  Carpal tunnel release  Right  07/23/13        Allergies    Allergies   Allergen  Reactions   .  Alendronate Sodium  Other (See Comments)       Severe chest pain similar to heart attack    .  Cefuroxime Axetil  Other (See Comments)       unknown   .  Codeine  Nausea And Vomiting   .  Macrodantin     .  Meperidine Hcl  Nausea And Vomiting   .  Other  Other (See Comments)       Hismanal and Maprobamate   portobello mushrooms - diarrhea   .  Sulfonamide Derivatives  Hives, Itching and Swelling        HPI This 80 year old woman is seen in my office today as an urgent work in.  We had not seen her here in the office since 07/15/12.  She has a past history of dyslipidemia and a past history of palpitations.  She does not have any past history of documented atrial flutter.  She does not have any history of known ischemic heart disease.  He has had a past history of hypercholesterolemia.  She was in her usual state of health until 2 days ago when she underwent cataract surgery by Chelsea Hancock at the [facility].  Her daughter recalls that consideration was given to  canceling the surgery because of the patient's fast heart rate noted at the time that she arrived.  However they proceeded to go ahead with surgery.  Surgery went well.  The patient has continued to have a racing heart since 2 days ago.  She called the office today with this history and we asked her to come in to be checked.  EKG shows atrial flutter with 21 response.  The patient is not having any chest pain but is feeling lightheaded.  Her blood pressure is adequately maintained.  Carotid sinus massage reveals that the heart slows transiently and then resumes heart rate in the 140s.  The patient has been lightheaded and feels like she might faint.   Home Medications    Prior to Admission medications    Medication  Sig  Start Date  End Date  Taking?  Authorizing Provider   aspirin EC 81 MG tablet  Take 81 mg by mouth daily.      Yes  Historical Provider, MD   Biotin 5 MG CAPS  Take 1 capsule by mouth daily.        Yes  Historical Provider, MD   bisoprolol (ZEBETA) 5 MG tablet  TAKE 1 TABLET BY MOUTH TWICE DAILY  04/24/14    Yes  Mosie Lukes, MD   Calcium Carbonate-Vitamin D (CALCIUM 600+D) 600-200 MG-UNIT TABS  Take 1 tablet by mouth daily after breakfast. Dr. Justine Null       Yes  Historical Provider, MD   citalopram (CELEXA) 10 MG tablet  TAKE 1 TABLET BY MOUTH EVERY DAY  12/30/13    Yes  Mosie Lukes, MD   Eszopiclone 3 MG TABS  TAKE 1 TABLET BY MOUTH EVERY DAY AT BEDTIME AS NEEDED  12/30/13    Yes  Mosie Lukes, MD   gabapentin (NEURONTIN) 600 MG tablet  TAKE 1 TABLET BY MOUTH THREE TIMES DAILY  04/17/14    Yes  Mosie Lukes, MD   hydrOXYzine (ATARAX/VISTARIL) 10 MG tablet  TAKE 1 TABLET BY MOUTH EVERY 6 HOURS AS NEEDED FOR ITCHING OR ANXIETY  04/17/14    Yes  Mosie Lukes, MD   hyoscyamine (LEVSIN SL) 0.125 MG SL tablet  Take 1-2 tablets by mouth every 4 hours as needed for bloating and diarrhea  10/17/13    Yes  Ladene Artist, MD   Magnesium 250 MG TABS  Take 250 mg by mouth daily. Dr. Justine Null       Yes  Historical Provider, MD   Multiple Vitamin (MULITIVITAMIN WITH MINERALS) TABS  Take 1 tablet by mouth daily.      Yes  Historical Provider, MD   NALTREXONE HCL PO  Take by mouth daily.      Yes  Historical Provider, MD   NEXIUM 40 MG capsule  TAKE 1 CAPSULE BY MOUTH DAILY AT 2PM  04/04/14    Yes  Mosie Lukes, MD   NON FORMULARY  Take 1 capsule by mouth daily. Mega Red      Yes  Historical Provider, MD   nortriptyline (PAMELOR) 25 MG capsule  TAKE 2 CAPSULES BY MOUTH EVERY NIGHT AT BEDTIME  04/17/14    Yes  Mosie Lukes, MD   ranitidine (ZANTAC) 300 MG tablet  TAKE 1 TABLET BY MOUTH AT BEDTIME  12/30/13    Yes  Mosie Lukes, MD   simvastatin (ZOCOR) 40 MG tablet  TAKE 1 TABLET BY MOUTH EVERY EVENING  04/17/14    Yes  Mosie Lukes, MD   torsemide (DEMADEX) 10 MG tablet  TAKE 1 TABLET BY MOUTH EVERY MORNING  04/24/14    Yes  Mosie Lukes, MD   triamcinolone cream (KENALOG) 0.1 %  Apply topically 2 (two) times daily as needed. pruritus  03/29/13    Yes  Mosie Lukes, MD   ZETIA 10 MG tablet  TAKE 1 TABLET BY MOUTH DAILY  04/07/14    Yes  Mosie Lukes, MD   zoledronic acid (RECLAST) 5 MG/100ML SOLN  Inject 5 mg into the vein once.      Yes  Historical Provider, MD        Family History    Family History   Problem  Relation  Age of Onset   .  Bipolar disorder  Mother     .  Stomach cancer  Mother     .  Mental illness  Mother     .  Stroke  Father     .  Colon cancer  Father     .  Throat cancer  Maternal Uncle         larynx   .  HIV  Son         Aids   .  Anesthesia problems  Neg Hx     .  Stomach cancer  Maternal Grandfather     .  Stomach cancer  Paternal Grandmother     .  Anxiety disorder  Mother     .  Diabetes  Neg Hx     .  Heart disease  Maternal Grandmother          Social History    History       Social History   .  Marital Status:  Divorced       Spouse Name:  N/A       Number of Children:  N/A   .  Years of Education:  N/A       Occupational  History   .  Not on file.       Social History Main Topics   .  Smoking status:  Former Smoker -- 4 years       Types:  Cigarettes   .  Smokeless tobacco:  Never Used         Comment: quit 20+yrs ago   .  Alcohol Use:  8.4 oz/week       14 Glasses of wine per week         Comment: WINE WITH DINNER. 2 GLASSES RED WINE   .  Drug Use:  No   .  Sexual Activity:  No       Other Topics  Concern   .  Not on file       Social History Narrative   .  No narrative on file        Review of Systems General:  No chills, fever, night sweats or weight changes.   Cardiovascular:  No chest pain, dyspnea on exertion, edema, orthopnea, palpitations, paroxysmal nocturnal dyspnea. Dermatological: No rash, lesions/masses Respiratory: No cough, dyspnea Urologic: No hematuria, dysuria Abdominal:   No nausea, vomiting, diarrhea, bright red blood per rectum, melena, or hematemesis Neurologic:  No visual changes, wkns, changes in mental status. All other systems reviewed and are otherwise negative except as noted above.   Physical Exam   Blood pressure 126/91, pulse 140, height 5\' 3"  (1.6 m), weight 161 lb (73.029 kg).  General: Pleasant, NAD, anxious.  Skin is slightly clammy. Psych: Normal affect. Neuro: Alert and oriented X 3. Moves all extremities spontaneously. HEENT: Normal          Neck: Supple without bruits or JVD. Lungs:  Resp regular and unlabored, CTA. Heart: Regular tachycardia.  There is transient slowing with carotid sinus massage. Abdomen: Soft, non-tender, non-distended, BS + x 4.   Extremities: No clubbing, cyanosis or edema. DP/PT/Radials 2+ and equal bilaterally.   Labs   Troponin (Point of Care Test) No results found for this basename: TROPIPOC,  in the last 72 hours No results found for this basename: CKTOTAL, CKMB, TROPONINI,  in the last 72 hours Lab Results   Component  Value  Date     WBC  5.1  02/10/2014     HGB  14.0  02/10/2014     HCT  41.6  02/10/2014      MCV  89.8  02/10/2014     PLT  253  02/10/2014     No results found for this basename: NA, K, CL, CO2, BUN, CREATININE, CALCIUM, LABALBU, PROT, BILITOT, ALKPHOS, ALT, AST, GLUCOSE,  in the last 168 hours Lab Results   Component  Value  Date     CHOL  170  02/10/2014     HDL  53  02/10/2014     LDLCALC  94  02/10/2014     TRIG  114  02/10/2014       No results found for this basename: DDIMER        Radiology/Studies   No results found.   ECG Atrial flutter with 2:1 response   ASSESSMENT AND PLAN   1. new-onset atrial flutter with 2 to one ventricular response. 2. Dyslipidemia 3. essential hypertension 4. past history of palpitations   Plan: Admit to telemetry.  Get 2-D echo.  Consider diltiazem drip.  Start anticoagulation.  Consider TEE cardioversion Friday if she fails to convert. Signed, Darlin Coco, MD 05/04/2014, 3:20 PM

## 2014-05-09 NOTE — Telephone Encounter (Signed)
New message   Daughter calling has question regarding discharge medication.

## 2014-05-09 NOTE — Telephone Encounter (Signed)
PT'S  DAUGHTER AWARE  TO DISCONTINUE  ASA  SINCE  PT  WAS  STARTED  ON ELIQUIS ./CY WILL FORWARD TO  DR  BRACKBILL  TO REVIEW .Adonis Housekeeper

## 2014-05-09 NOTE — Telephone Encounter (Signed)
Yes now that she is on apixaban she does not need the baby ASA

## 2014-05-15 ENCOUNTER — Other Ambulatory Visit: Payer: Self-pay | Admitting: Family Medicine

## 2014-05-22 ENCOUNTER — Other Ambulatory Visit: Payer: Self-pay | Admitting: Family Medicine

## 2014-05-28 ENCOUNTER — Other Ambulatory Visit: Payer: Self-pay | Admitting: Family Medicine

## 2014-06-01 ENCOUNTER — Ambulatory Visit: Payer: Medicare Other | Admitting: Nurse Practitioner

## 2014-06-01 ENCOUNTER — Ambulatory Visit (INDEPENDENT_AMBULATORY_CARE_PROVIDER_SITE_OTHER): Payer: Medicare Other | Admitting: Nurse Practitioner

## 2014-06-01 ENCOUNTER — Encounter: Payer: Self-pay | Admitting: Nurse Practitioner

## 2014-06-01 VITALS — BP 118/74 | HR 52 | Ht 63.0 in | Wt 162.4 lb

## 2014-06-01 DIAGNOSIS — I10 Essential (primary) hypertension: Secondary | ICD-10-CM | POA: Diagnosis not present

## 2014-06-01 DIAGNOSIS — Z7901 Long term (current) use of anticoagulants: Secondary | ICD-10-CM | POA: Diagnosis not present

## 2014-06-01 DIAGNOSIS — I4892 Unspecified atrial flutter: Secondary | ICD-10-CM

## 2014-06-01 LAB — BASIC METABOLIC PANEL
BUN: 19 mg/dL (ref 6–23)
CO2: 27 mEq/L (ref 19–32)
Calcium: 9.6 mg/dL (ref 8.4–10.5)
Chloride: 102 mEq/L (ref 96–112)
Creatinine, Ser: 0.9 mg/dL (ref 0.4–1.2)
GFR: 66.98 mL/min (ref >60.00)
Glucose, Bld: 90 mg/dL (ref 70–99)
Potassium: 4.1 mEq/L (ref 3.5–5.1)
Sodium: 137 mEq/L (ref 135–145)

## 2014-06-01 LAB — CBC
HCT: 40.6 % (ref 36.0–46.0)
Hemoglobin: 13.6 g/dL (ref 12.0–15.0)
MCHC: 33.5 g/dL (ref 30.0–36.0)
MCV: 90.7 fl (ref 78.0–100.0)
Platelets: 235 10*3/uL (ref 150.0–400.0)
RBC: 4.48 Mil/uL (ref 3.87–5.11)
RDW: 13.5 % (ref 11.5–15.5)
WBC: 7.3 10*3/uL (ref 4.0–10.5)

## 2014-06-01 LAB — TSH: TSH: 1.27 u[IU]/mL (ref 0.35–4.50)

## 2014-06-01 MED ORDER — APIXABAN 5 MG PO TABS: 5.0000 mg | ORAL_TABLET | Freq: Two times a day (BID) | ORAL | Status: DC

## 2014-06-01 NOTE — Patient Instructions (Addendum)
Stay on your current medicines  You are still in a normal rhythm today  We will check labs today  See Dr. [name] in 2 months  Call the [place] office at [phone] if you have any questions, problems or concerns.

## 2014-06-01 NOTE — Progress Notes (Signed)
Chelsea Hancock Date of Birth: January 03, 1934 Medical Record #[id number]  History of Present Illness: Ms. Chelsea Hancock is seen back today for a post hospital visit. Seen for Dr. [name]. She is a 80 year old female with palpitations and HLD. Other issues include IBS, fibromyalgia and depression.   Most recently seen here as an urgent work in for heart racing - noted to be in atrial flutter 2:1 VR - was admitted - anticoagulation started with Eliquis. Echo showed normal EF. Rate was controlled. Looks like she was to wait 4 weeks for cardioversion.   Comes in today. Here with her daughter. Doing ok. She tells me she converted on her own. Feels much better. Back to her baseline. No chest pain. Not short of breath. Not dizzy or lightheaded. Feels ok on her medicines. Does use ibuprofen one time a week - says she can't function without it - planning on TKR in December. No active bleeding reported. She denies a history of thyroid issues - TSH was elevated at her admission - will need to recheck.   Current Outpatient Prescriptions  Medication Sig Dispense Refill  . apixaban (ELIQUIS) 5 MG TABS tablet Take 1 tablet (5 mg total) by mouth 2 (two) times daily.  60 tablet  11  . Biotin 5 MG CAPS Take 1 capsule by mouth daily.        . bisoprolol (ZEBETA) 5 MG tablet TAKE 1 TABLET BY MOUTH TWICE DAILY  60 tablet  0  . Calcium Carbonate-Vitamin D (CALCIUM 600+D) 600-200 MG-UNIT TABS Take 1 tablet by mouth daily after breakfast. Dr. Justine Null       . CARTIA XT 120 MG 24 hr capsule Take 120 mg by mouth daily.       . citalopram (CELEXA) 10 MG tablet TAKE 1 TABLET BY MOUTH EVERY DAY  30 tablet  0  . clobetasol ointment (TEMOVATE) 6.71 % Apply 1 application topically 2 (two) times daily as needed (for itching).      . Eszopiclone 3 MG TABS TAKE 1 TABLET BY MOUTH EVERY NIGHT AT BEDTIME AS NEEDED  30 tablet  0  . gabapentin (NEURONTIN) 600 MG tablet Take 600 mg by mouth 3 (three) times daily.      . hydrOXYzine  (ATARAX/VISTARIL) 10 MG tablet TAKE 1 TABLET BY MOUTH EVERY 6 HOURS AS NEEDED FOR ITCHING OR ANXIETY  60 tablet  0  . ibuprofen (ADVIL,MOTRIN) 200 MG tablet Take 200 mg by mouth as needed.      . loperamide (IMODIUM) 2 MG capsule Take 2 mg by mouth as needed for diarrhea or loose stools.      . Multiple Vitamin (MULITIVITAMIN WITH MINERALS) TABS Take 1 tablet by mouth daily.      Marland Kitchen NALTREXONE HCL PO Take 5 mg by mouth every morning. *made at [facility]*      . NEXIUM 40 MG capsule TAKE ONE CAPSULE BY MOUTH EVERY DAY AT 2:00 PM  30 capsule  0  . nortriptyline (PAMELOR) 25 MG capsule TAKE 2 CAPSULES BY MOUTH EVERY NIGHT AT BEDTIME  60 capsule  0  . Omega-3 Fatty Acids (OMEGA 3 PO) Take by mouth daily.      . Probiotic Product (ALIGN PO) Take by mouth daily.      . ranitidine (ZANTAC) 300 MG tablet Take 300 mg by mouth at bedtime.      . simvastatin (ZOCOR) 40 MG tablet TAKE 1 TABLET BY MOUTH EVERY EVENING  30 tablet  0  .  torsemide (DEMADEX) 10 MG tablet Take 10 mg by mouth daily.      Marland Kitchen triamcinolone cream (KENALOG) 0.1 % Apply 1 application topically 2 (two) times daily as needed (for pruitus).      Marland Kitchen ZETIA 10 MG tablet TAKE 1 TABLET BY MOUTH DAILY  30 tablet  0  . zoledronic acid (RECLAST) 5 MG/100ML SOLN Inject 5 mg into the vein once.       No current facility-administered medications for this visit.    Allergies  Allergen Reactions  . Cefuroxime Axetil Anaphylaxis    unknown  . Macrodantin Anaphylaxis  . Alendronate Sodium Other (See Comments)    Severe chest pain similar to heart attack   . Codeine Nausea And Vomiting  . Meperidine Hcl Nausea And Vomiting  . Other Other (See Comments)    Hismanal and Maprobamate  portobello mushrooms - diarrhea  . Sulfonamide Derivatives Hives, Itching and Swelling    Past Medical History  Diagnosis Date  . Hyperlipidemia     takes Zetia and Simvastatin nightly  . Palpitation   . Carotid bruit     Right  . Edema   . Arthritis     . PONV (postoperative nausea and vomiting)   . Hyperlipidemia 01/27/2012  . Allergy     portabella mushrooms/diarrhea  . Heart murmur   . Osteoporosis   . Fibromyalgia   . Thrush 03/23/2012  . Vaginitis 03/23/2012  . Sigmoid colon ulcer 03/23/2012  . Migraine 04/27/2012  . Hyperglycemia 04/27/2012  . Dysrhythmia     paroxysmal A fib, irregular heartbeat;takes Bisoprolol nightly  . History of migraine     last about 47yrs ago  . Foot drop, right   . Joint pain   . Joint swelling   . Osteoporosis   . Chronic back pain   . Skin problem in pregnancy     takes gabapentin 3 times a day and also has an ointment  . GERD (gastroesophageal reflux disease)     takes Zantac and Nexium daily  . Hemorrhoids   . Diverticulosis   . IBS (irritable bowel syndrome)     takes align daily  . Cataracts, bilateral   . Hypocalcemia 03/29/2013  . Depression with anxiety 03/29/2013  . Tachycardia 07/03/2013  . Onychomycosis 07/03/2013  . Adenomatous polyp of colon 1991, 2008    Past Surgical History  Procedure Laterality Date  . Abdominal hysterectomy  1981  . Lumbar laminectomy  1960    x 2  . Veins stripped  1970  . Total hip arthroplasty Left 2012  . Carpal tunnel release Left     ulnar nerve release at elbow  . Cervical fusion  2003, 2005  . Back surgery  1997  . Tonsillectomy  1943  . Left elbow surgery  06/03/11  . Colonoscopy    . Posterior cervical fusion/foraminotomy  08/03/2012    Procedure: POSTERIOR CERVICAL FUSION/FORAMINOTOMY LEVEL 5;  Surgeon: [name], MD;  Location: [facility];  Service: Neurosurgery;  Laterality: N/A;  Cervical four to Thoracic two Posterior cervical decompression with Facet and Pedicle screw fixation  . Carpal tunnel release Right 07/23/13    History  Smoking status  . Former Smoker -- 4 years  . Types: Cigarettes  Smokeless tobacco  . Never Used    Comment: quit 20+yrs ago    History  Alcohol Use  . 8.4 oz/week  . 14 Glasses of wine per week     Comment: WINE WITH DINNER. 2 GLASSES  RED WINE    Family History  Problem Relation Age of Onset  . Bipolar disorder Mother   . Stomach cancer Mother   . Mental illness Mother   . Stroke Father   . Colon cancer Father   . Throat cancer Maternal Uncle     larynx  . HIV Son     Aids  . Anesthesia problems Neg Hx   . Stomach cancer Maternal Grandfather   . Stomach cancer Paternal Grandmother   . Anxiety disorder Mother   . Diabetes Neg Hx   . Heart disease Maternal Grandmother     Review of Systems: The review of systems is per the HPI.  All other systems were reviewed and are negative.  Physical Exam: BP 118/74  Pulse 52  Ht 5\' 3"  (1.6 m)  Wt 162 lb 6.4 oz (73.664 kg)  BMI 28.78 kg/m2 Patient is very pleasant and in no acute distress. Skin is warm and dry. Color is normal.  HEENT is unremarkable. Normocephalic/atraumatic. PERRL. Sclera are nonicteric. Neck is supple. No masses. No JVD. Lungs are clear. Cardiac exam shows a regular rate and rhythm. Abdomen is soft. Extremities are without edema. Gait and ROM are intact. No gross neurologic deficits noted.  Wt Readings from Last 3 Encounters:  06/01/14 162 lb 6.4 oz (73.664 kg)  05/04/14 160 lb 15 oz (73 kg)  05/04/14 161 lb (73.029 kg)    LABORATORY DATA/PROCEDURES: EKG showing sinus brady - rate of 52   Lab Results  Component Value Date   WBC 8.1 05/05/2014   HGB 13.8 05/05/2014   HCT 41.0 05/05/2014   PLT 234 05/05/2014   GLUCOSE 98 05/05/2014   CHOL 179 05/05/2014   TRIG 235* 05/05/2014   HDL 47 05/05/2014   LDLDIRECT 93.3 08/04/2008   LDLCALC 85 05/05/2014   ALT 19 05/04/2014   AST 22 05/04/2014   NA 142 05/05/2014   K 4.1 05/05/2014   CL 105 05/05/2014   CREATININE 0.89 05/05/2014   BUN 20 05/05/2014   CO2 23 05/05/2014   TSH 7.600* 05/04/2014   INR 1.06 05/04/2014   HGBA1C 6.0* 05/04/2014    BNP (last 3 results) No results found for this basename: PROBNP,  in the last 8760 hours  Echo Study Conclusions from August  2015 - Left ventricle: The cavity size was normal. There was mild concentric hypertrophy. Systolic function was normal. The estimated ejection fraction was in the range of 55% to 60%. Wall motion was normal; there were no regional wall motion abnormalities. The study is not technically sufficient to allow evaluation of LV diastolic function. - Atrial septum: No defect or patent foramen ovale was identified.   Assessment / Plan: 1. Atrial flutter - remains in sinus today - rate is 52. She has improved clinically. EF normal. Would continue with long term anticoagulation. Advised against NSAID use. See back in 2 months - will also serve as pre op visit.   2. HLD -  On statin therapy  3. Elevated TSH - will recheck today  Check baseline labs today. See in 2 months. No change in medicines.   Patient is agreeable to this plan and will call if any problems develop in the interim.   Burtis Junes, RN, [facility] [address] [place], [place]  [address] [phone]

## 2014-06-05 ENCOUNTER — Other Ambulatory Visit: Payer: Self-pay | Admitting: Family Medicine

## 2014-06-05 ENCOUNTER — Telehealth: Payer: Self-pay | Admitting: Cardiology

## 2014-06-05 NOTE — Telephone Encounter (Signed)
New problem   Pt is having problems getting her Eliquis filled at [facility]. They need to know why pt has to take this.  Please call pt.

## 2014-06-05 NOTE — Telephone Encounter (Signed)
Called insurance [phone] ID [id number] and obtained authorization good until 07/09/2014 only. Advised patient and pharmacy

## 2014-06-06 DIAGNOSIS — M810 Age-related osteoporosis without current pathological fracture: Secondary | ICD-10-CM | POA: Diagnosis not present

## 2014-06-06 DIAGNOSIS — M1990 Unspecified osteoarthritis, unspecified site: Secondary | ICD-10-CM | POA: Diagnosis not present

## 2014-06-06 DIAGNOSIS — IMO0001 Reserved for inherently not codable concepts without codable children: Secondary | ICD-10-CM | POA: Diagnosis not present

## 2014-06-06 DIAGNOSIS — Z23 Encounter for immunization: Secondary | ICD-10-CM | POA: Diagnosis not present

## 2014-06-08 ENCOUNTER — Other Ambulatory Visit: Payer: Self-pay | Admitting: Family Medicine

## 2014-06-14 ENCOUNTER — Other Ambulatory Visit: Payer: Self-pay | Admitting: Family Medicine

## 2014-06-19 ENCOUNTER — Other Ambulatory Visit: Payer: Self-pay | Admitting: Family Medicine

## 2014-06-27 ENCOUNTER — Other Ambulatory Visit: Payer: Self-pay | Admitting: Family Medicine

## 2014-07-06 DIAGNOSIS — M25561 Pain in right knee: Secondary | ICD-10-CM | POA: Diagnosis not present

## 2014-07-06 DIAGNOSIS — M1711 Unilateral primary osteoarthritis, right knee: Secondary | ICD-10-CM | POA: Diagnosis not present

## 2014-07-12 ENCOUNTER — Other Ambulatory Visit: Payer: Self-pay | Admitting: Family Medicine

## 2014-07-21 ENCOUNTER — Other Ambulatory Visit: Payer: Self-pay | Admitting: Family Medicine

## 2014-07-31 ENCOUNTER — Other Ambulatory Visit: Payer: Self-pay | Admitting: Family Medicine

## 2014-08-03 ENCOUNTER — Encounter: Payer: Self-pay | Admitting: Family Medicine

## 2014-08-03 ENCOUNTER — Ambulatory Visit (INDEPENDENT_AMBULATORY_CARE_PROVIDER_SITE_OTHER): Payer: Medicare Other | Admitting: Family Medicine

## 2014-08-03 VITALS — BP 108/71 | HR 67 | Temp 98.5°F | Ht 63.0 in | Wt 155.4 lb

## 2014-08-03 DIAGNOSIS — Z124 Encounter for screening for malignant neoplasm of cervix: Secondary | ICD-10-CM

## 2014-08-03 DIAGNOSIS — M797 Fibromyalgia: Secondary | ICD-10-CM

## 2014-08-03 DIAGNOSIS — I4892 Unspecified atrial flutter: Secondary | ICD-10-CM

## 2014-08-03 DIAGNOSIS — B359 Dermatophytosis, unspecified: Secondary | ICD-10-CM | POA: Diagnosis not present

## 2014-08-03 DIAGNOSIS — Z79899 Other long term (current) drug therapy: Secondary | ICD-10-CM | POA: Diagnosis not present

## 2014-08-03 DIAGNOSIS — E039 Hypothyroidism, unspecified: Secondary | ICD-10-CM

## 2014-08-03 DIAGNOSIS — Z0000 Encounter for general adult medical examination without abnormal findings: Secondary | ICD-10-CM

## 2014-08-03 DIAGNOSIS — E785 Hyperlipidemia, unspecified: Secondary | ICD-10-CM | POA: Diagnosis not present

## 2014-08-03 DIAGNOSIS — K219 Gastro-esophageal reflux disease without esophagitis: Secondary | ICD-10-CM

## 2014-08-03 DIAGNOSIS — I4891 Unspecified atrial fibrillation: Secondary | ICD-10-CM

## 2014-08-03 DIAGNOSIS — N898 Other specified noninflammatory disorders of vagina: Secondary | ICD-10-CM | POA: Diagnosis not present

## 2014-08-03 LAB — HEPATIC FUNCTION PANEL
ALT: 26 U/L (ref 0–35)
AST: 27 U/L (ref 0–37)
Albumin: 3.5 g/dL (ref 3.5–5.2)
Alkaline Phosphatase: 95 U/L (ref 39–117)
Bilirubin, Direct: 0 mg/dL (ref 0.0–0.3)
Total Bilirubin: 0.6 mg/dL (ref 0.2–1.2)
Total Protein: 7 g/dL (ref 6.0–8.3)

## 2014-08-03 LAB — RENAL FUNCTION PANEL
Albumin: 3.5 g/dL (ref 3.5–5.2)
BUN: 21 mg/dL (ref 6–23)
CO2: 18 mEq/L — ABNORMAL LOW (ref 19–32)
Calcium: 9.3 mg/dL (ref 8.4–10.5)
Chloride: 106 mEq/L (ref 96–112)
Creatinine, Ser: 0.9 mg/dL (ref 0.4–1.2)
GFR: 68.77 mL/min (ref >60.00)
Glucose, Bld: 106 mg/dL — ABNORMAL HIGH (ref 70–99)
Phosphorus: 2.4 mg/dL (ref 2.3–4.6)
Potassium: 4.2 mEq/L (ref 3.5–5.1)
Sodium: 139 mEq/L (ref 135–145)

## 2014-08-03 LAB — TSH: TSH: 1.77 u[IU]/mL (ref 0.35–4.50)

## 2014-08-03 LAB — LIPID PANEL
Cholesterol: 187 mg/dL (ref 0–200)
HDL: 45.8 mg/dL (ref >39.00)
LDL Cholesterol: 111 mg/dL — ABNORMAL HIGH (ref 0–99)
NonHDL: 141.2
Total CHOL/HDL Ratio: 4
Triglycerides: 150 mg/dL — ABNORMAL HIGH (ref 0.0–149.0)
VLDL: 30 mg/dL (ref 0.0–40.0)

## 2014-08-03 LAB — CBC
HCT: 40.4 % (ref 36.0–46.0)
Hemoglobin: 13.3 g/dL (ref 12.0–15.0)
MCHC: 32.9 g/dL (ref 30.0–36.0)
MCV: 91.4 fl (ref 78.0–100.0)
Platelets: 237 10*3/uL (ref 150.0–400.0)
RBC: 4.42 Mil/uL (ref 3.87–5.11)
RDW: 13.9 % (ref 11.5–15.5)
WBC: 7 10*3/uL (ref 4.0–10.5)

## 2014-08-03 NOTE — Progress Notes (Signed)
Pre visit review using our clinic review tool, if applicable. No additional management support is needed unless otherwise documented below in the visit note. 

## 2014-08-03 NOTE — Patient Instructions (Signed)

## 2014-08-03 NOTE — Progress Notes (Signed)
Patient ID: Chelsea Hancock, female   DOB: [birth year], 80 y.o.   MRN: [id number] AMIAYAH GIEBEL [id number] 08-31-1934 08/03/2014      Progress Note-Follow Up  Subjective  Chief Complaint  Chief Complaint  Patient presents with  . Annual Exam    medicare wellness    HPI  Patient is a 80 year old female in today for routine medical care.Patient in for annual exam generally doing well but is onting a slight scratchy throat without pain or fever. Is also noting some scant yellow vaginal discharge off and on for several weeks. No pain or odor associated. Is scheduled to have right TKR with Dr Maureen Ralphs on 11/17/14. If frustrated by poor mobility due to pain and anxious to proceed. Denies CP/palp/SOB/HA/congestion/fevers/GI or GU c/o. Taking meds as prescribed  Past Medical History  Diagnosis Date  . Hyperlipidemia     takes Zetia and Simvastatin nightly  . Palpitation   . Carotid bruit     Right  . Edema   . Arthritis   . PONV (postoperative nausea and vomiting)   . Hyperlipidemia 01/27/2012  . Allergy     portabella mushrooms/diarrhea  . Heart murmur   . Osteoporosis   . Fibromyalgia   . Thrush 03/23/2012  . Vaginitis 03/23/2012  . Sigmoid colon ulcer 03/23/2012  . Migraine 04/27/2012  . Hyperglycemia 04/27/2012  . Dysrhythmia     paroxysmal A fib, irregular heartbeat;takes Bisoprolol nightly  . History of migraine     last about 12yrs ago  . Foot drop, right   . Joint pain   . Joint swelling   . Osteoporosis   . Chronic back pain   . Skin problem in pregnancy     takes gabapentin 3 times a day and also has an ointment  . GERD (gastroesophageal reflux disease)     takes Zantac and Nexium daily  . Hemorrhoids   . Diverticulosis   . IBS (irritable bowel syndrome)     takes align daily  . Cataracts, bilateral   . Hypocalcemia 03/29/2013  . Depression with anxiety 03/29/2013  . Tachycardia 07/03/2013  . Onychomycosis 07/03/2013  . Adenomatous polyp of colon 1991, 2008     Past Surgical History  Procedure Laterality Date  . Abdominal hysterectomy  1981  . Lumbar laminectomy  1960    x 2  . Veins stripped  1970  . Total hip arthroplasty Left 2012  . Carpal tunnel release Left     ulnar nerve release at elbow  . Cervical fusion  2003, 2005  . Back surgery  1997  . Tonsillectomy  1943  . Left elbow surgery  06/03/11  . Colonoscopy    . Posterior cervical fusion/foraminotomy  08/03/2012    Procedure: POSTERIOR CERVICAL FUSION/FORAMINOTOMY LEVEL 5;  Surgeon: [name], MD;  Location: [facility];  Service: Neurosurgery;  Laterality: N/A;  Cervical four to Thoracic two Posterior cervical decompression with Facet and Pedicle screw fixation  . Carpal tunnel release Right 07/23/13    Family History  Problem Relation Age of Onset  . Bipolar disorder Mother   . Stomach cancer Mother   . Mental illness Mother   . Stroke Father   . Colon cancer Father   . Throat cancer Maternal Uncle     larynx  . HIV Son     Aids  . Anesthesia problems Neg Hx   . Stomach cancer Maternal Grandfather   . Stomach cancer Paternal Grandmother   . Anxiety disorder  Mother   . Diabetes Neg Hx   . Heart disease Maternal Grandmother     History   Social History  . Marital Status: Divorced    Spouse Name: N/A    Number of Children: N/A  . Years of Education: N/A   Occupational History  . Not on file.   Social History Main Topics  . Smoking status: Former Smoker -- 4 years    Types: Cigarettes  . Smokeless tobacco: Never Used     Comment: quit 20+yrs ago  . Alcohol Use: 8.4 oz/week    14 Glasses of wine per week     Comment: WINE WITH DINNER. 2 GLASSES RED WINE  . Drug Use: No  . Sexual Activity: No   Other Topics Concern  . Not on file   Social History Narrative    Current Outpatient Prescriptions on File Prior to Visit  Medication Sig Dispense Refill  . apixaban (ELIQUIS) 5 MG TABS tablet Take 1 tablet (5 mg total) by mouth 2 (two) times daily. 60  tablet 11  . Biotin 5 MG CAPS Take 1 capsule by mouth daily.      . bisoprolol (ZEBETA) 5 MG tablet TAKE 1 TABLET BY MOUTH TWICE DAILY 60 tablet 0  . Calcium Carbonate-Vitamin D (CALCIUM 600+D) 600-200 MG-UNIT TABS Take 1 tablet by mouth daily after breakfast. Dr. Justine Null     . CARTIA XT 120 MG 24 hr capsule Take 120 mg by mouth daily.     . citalopram (CELEXA) 10 MG tablet TAKE 1 TABLET BY MOUTH EVERY DAY 30 tablet 2  . clobetasol ointment (TEMOVATE) 0.34 % Apply 1 application topically 2 (two) times daily as needed (for itching).    . Eszopiclone 3 MG TABS TAKE 1 TABLET BY MOUTH EVERY NIGHT AT BEDTIME AS NEEDED 30 tablet 2  . gabapentin (NEURONTIN) 600 MG tablet TAKE 1 TABLET BY MOUTH THREE TIMES DAILY 90 tablet 0  . hydrOXYzine (ATARAX/VISTARIL) 10 MG tablet TAKE 1 TABLET BY MOUTH EVERY 6 HOURS AS NEEDED FOR ITCHING OR ANXIETY 60 tablet 0  . ibuprofen (ADVIL,MOTRIN) 200 MG tablet Take 200 mg by mouth as needed.    . loperamide (IMODIUM) 2 MG capsule Take 2 mg by mouth as needed for diarrhea or loose stools.    . Multiple Vitamin (MULITIVITAMIN WITH MINERALS) TABS Take 1 tablet by mouth daily.    Marland Kitchen NALTREXONE HCL PO Take 5 mg by mouth every morning. *made at [facility]*    . NEXIUM 40 MG capsule TAKE ONE CAPSULE BY MOUTH EVERY DAY AT 2:00 PM 30 capsule 2  . nortriptyline (PAMELOR) 25 MG capsule TAKE 2 CAPSULES BY MOUTH EVERY NIGHT AT BEDTIME 60 capsule 0  . Omega-3 Fatty Acids (OMEGA 3 PO) Take by mouth daily.    . Probiotic Product (ALIGN PO) Take by mouth daily.    . ranitidine (ZANTAC) 300 MG tablet TAKE 1 TABLET BY MOUTH AT BEDTIME 30 tablet 0  . simvastatin (ZOCOR) 40 MG tablet TAKE 1 TABLET BY MOUTH EVERY EVENING 30 tablet 2  . torsemide (DEMADEX) 10 MG tablet TAKE 1 TABLET BY MOUTH EVERY MORNING 30 tablet 0  . triamcinolone cream (KENALOG) 0.1 % Apply 1 application topically 2 (two) times daily as needed (for pruitus).    Marland Kitchen ZETIA 10 MG tablet TAKE 1 TABLET BY MOUTH EVERY DAY 30  tablet 2  . zoledronic acid (RECLAST) 5 MG/100ML SOLN Inject 5 mg into the vein once.  No current facility-administered medications on file prior to visit.    Allergies  Allergen Reactions  . Cefuroxime Axetil Anaphylaxis    unknown  . Macrodantin Anaphylaxis  . Alendronate Sodium Other (See Comments)    Severe chest pain similar to heart attack   . Codeine Nausea And Vomiting  . Meperidine Hcl Nausea And Vomiting  . Other Other (See Comments)    Hismanal and Maprobamate  portobello mushrooms - diarrhea  . Sulfonamide Derivatives Hives, Itching and Swelling    Review of Systems  Review of Systems  Constitutional: Negative for fever and malaise/fatigue.  HENT: Negative for congestion.   Eyes: Negative for discharge.  Respiratory: Negative for shortness of breath.   Cardiovascular: Negative for chest pain, palpitations and leg swelling.  Gastrointestinal: Negative for nausea, abdominal pain and diarrhea.  Genitourinary: Negative for dysuria.  Musculoskeletal: Negative for falls.  Skin: Negative for rash.  Neurological: Negative for loss of consciousness and headaches.  Endo/Heme/Allergies: Negative for polydipsia.  Psychiatric/Behavioral: Negative for depression and suicidal ideas. The patient is not nervous/anxious and does not have insomnia.     Objective  BP 108/71 mmHg  Pulse 67  Temp(Src) 98.5 F (36.9 C)  Ht 5\' 3"  (1.6 m)  Wt 155 lb 6.4 oz (70.489 kg)  BMI 27.53 kg/m2  SpO2 95%  Physical Exam  Physical Exam  Constitutional: She is oriented to person, place, and time and well-developed, well-nourished, and in no distress. No distress.  HENT:  Head: Normocephalic and atraumatic.  Eyes: Conjunctivae are normal.  Neck: Neck supple. No thyromegaly present.  Cardiovascular: Normal rate, regular rhythm and normal heart sounds.   No murmur heard. Pulmonary/Chest: Effort normal and breath sounds normal. She has no wheezes.  Abdominal: She exhibits no  distension and no mass.  Musculoskeletal: She exhibits no edema.  Lymphadenopathy:    She has no cervical adenopathy.  Neurological: She is alert and oriented to person, place, and time.  Skin: Skin is warm and dry. No rash noted. She is not diaphoretic.  Psychiatric: Memory, affect and judgment normal.    Lab Results  Component Value Date   TSH 1.27 06/01/2014   Lab Results  Component Value Date   WBC 7.3 06/01/2014   HGB 13.6 06/01/2014   HCT 40.6 06/01/2014   MCV 90.7 06/01/2014   PLT 235.0 06/01/2014   Lab Results  Component Value Date   CREATININE 0.9 06/01/2014   BUN 19 06/01/2014   NA 137 06/01/2014   K 4.1 06/01/2014   CL 102 06/01/2014   CO2 27 06/01/2014   Lab Results  Component Value Date   ALT 19 05/04/2014   AST 22 05/04/2014   ALKPHOS 91 05/04/2014   BILITOT 0.3 05/04/2014   Lab Results  Component Value Date   CHOL 179 05/05/2014   Lab Results  Component Value Date   HDL 47 05/05/2014   Lab Results  Component Value Date   LDLCALC 85 05/05/2014   Lab Results  Component Value Date   TRIG 235* 05/05/2014   Lab Results  Component Value Date   CHOLHDL 3.8 05/05/2014     Assessment & Plan  GERD Avoid offending foods, start probiotics. Do not eat large meals in late evening and consider raising head of bed.   Dyslipidemia Tolerating statin, encouraged heart healthy diet, avoid trans fats, minimize simple carbs and saturated fats. Increase exercise as tolerated  Cervical cancer screening Patient is c/o some yellow vaginal discharge off and on for several weeks,  no pain, fevers associated. No sexual activity. Would like to return back to GYN for care so will refer them back to evaluate  Atrial flutter RRR today, following with Dr [name], on Eliquis  Medicare annual wellness visit, subsequent Patient denies any difficulties at home. No trouble with ADLs, depression or falls. No recent changes to vision or hearing. Is UTD with  immunizations. Is UTD with screening. Discussed Advanced Directives, patient agrees to bring [place] copies of documents if can. Encouraged heart healthy diet, exercise as tolerated and adequate sleep. Sees Dr [name] cardiology Dr Allyson Sabal dermatology Dr Herbert Deaner opthamology Dr Maureen Ralphs orthopaedics Dr Fuller Plan gastroenterology, colonoscopy in 2013, repeat in 5-10 years. MGM was March 2015 Pap 08/15/2012 Labs ordered and reviewed

## 2014-08-09 ENCOUNTER — Telehealth: Payer: Self-pay | Admitting: *Deleted

## 2014-08-09 NOTE — Telephone Encounter (Signed)
Prior authorization for Eliquis initiated. Awaiting determination. JG//CMA  

## 2014-08-10 ENCOUNTER — Telehealth: Payer: Self-pay | Admitting: *Deleted

## 2014-08-10 NOTE — Telephone Encounter (Signed)
Patient stated that she needs a prior authorization for her insurance to pay for the eliquis. She is completely out of the medication and will come by later today to pick up samples. Thanks, MI

## 2014-08-11 ENCOUNTER — Other Ambulatory Visit: Payer: Self-pay | Admitting: Family Medicine

## 2014-08-11 NOTE — Telephone Encounter (Signed)
Please tell patient her Eliquis was denied, should talk to her cardiologist regarding switching or coumadin or any thoughts on getting her Eliquis

## 2014-08-11 NOTE — Telephone Encounter (Signed)
Prior authorization denied. Alternative is Warfarin.

## 2014-08-13 ENCOUNTER — Encounter: Payer: Self-pay | Admitting: Family Medicine

## 2014-08-13 DIAGNOSIS — Z0000 Encounter for general adult medical examination without abnormal findings: Secondary | ICD-10-CM | POA: Insufficient documentation

## 2014-08-13 NOTE — Assessment & Plan Note (Addendum)
Patient denies any difficulties at home. No trouble with ADLs, depression or falls. No recent changes to vision or hearing. Is UTD with immunizations. Is UTD with screening. Discussed Advanced Directives, patient agrees to bring [place] copies of documents if can. Encouraged heart healthy diet, exercise as tolerated and adequate sleep. Sees Dr [name] cardiology Dr Allyson Sabal dermatology Dr Herbert Deaner opthamology Dr Maureen Ralphs orthopaedics Dr Fuller Plan gastroenterology, colonoscopy in 2013, repeat in 5-10 years. MGM was March 2015 Pap 08/15/2012 Labs ordered and reviewed

## 2014-08-13 NOTE — Assessment & Plan Note (Signed)
Avoid offending foods, start probiotics. Do not eat large meals in late evening and consider raising head of bed.  

## 2014-08-13 NOTE — Assessment & Plan Note (Signed)
RRR today, following with Dr [name], on Eliquis

## 2014-08-13 NOTE — Assessment & Plan Note (Signed)
Patient is c/o some yellow vaginal discharge off and on for several weeks, no pain, fevers associated. No sexual activity. Would like to return back to GYN for care so will refer them back to evaluate

## 2014-08-13 NOTE — Assessment & Plan Note (Signed)
Tolerating statin, encouraged heart healthy diet, avoid trans fats, minimize simple carbs and saturated fats. Increase exercise as tolerated 

## 2014-08-14 NOTE — Telephone Encounter (Signed)
Patient informed and states she will call the cardiologist

## 2014-08-14 NOTE — Telephone Encounter (Signed)
Rx request to pharmacy/SLS  

## 2014-08-15 ENCOUNTER — Telehealth: Payer: Self-pay | Admitting: Cardiology

## 2014-08-15 ENCOUNTER — Ambulatory Visit (INDEPENDENT_AMBULATORY_CARE_PROVIDER_SITE_OTHER): Payer: Medicare Other | Admitting: Cardiology

## 2014-08-15 VITALS — BP 118/70 | HR 56 | Ht 63.0 in | Wt 154.0 lb

## 2014-08-15 DIAGNOSIS — I4892 Unspecified atrial flutter: Secondary | ICD-10-CM

## 2014-08-15 MED ORDER — WARFARIN SODIUM 5 MG PO TABS: 5.0000 mg | ORAL_TABLET | Freq: Every day | ORAL | Status: DC

## 2014-08-15 NOTE — Assessment & Plan Note (Signed)
She has not had any recurrence of her atrial flutter. She has not been aware of any racing of her heart. She is on anticoagulation and has not had any TIA symptoms. We will be transitioning her from Eliquis to warfarin. Her Chadds vasc score is 4.

## 2014-08-15 NOTE — Assessment & Plan Note (Signed)
The patient has a history of fibromyalgia followed by rheumatology. She is no longer taking naltrexone

## 2014-08-15 NOTE — Telephone Encounter (Signed)
New Msg   Patient states she was informed to contact office in regards to new medications. Please contact at [phone]

## 2014-08-15 NOTE — Patient Instructions (Signed)
Will transition you to Warfarin when you finish your current dose of Eliquis  When you are down to your last 3 days of Eliquis you will start Coumadin (warfarin) 5 mg daily. You will be taking both for 3 days only  After 3 days you will continue the Coumadin 5 mg daily ONLY   You will need lab (coumadin clinic) appointment 1 week after starting the Coumadin  Call the office to schedule when you get home and figure out when you will start Coumadin  Your physician recommends that you schedule a follow-up appointment in: 3 month ov/ekg

## 2014-08-15 NOTE — Progress Notes (Signed)
[name] Date of Birth: 12/15/1933 Medical Record #[id number]  History of Present Illness:  this 80 year old woman is seen for a scheduled follow-up office visit. He has a past history of paroxysmal atrial flutter. At her last visit she had converted to normal sinus rhythm.  She has remained on Eliquis but has had difficulty getting it paid by her insurance company   the patient has normal left ventricular ejection fraction by echocardiogram. She is not physically active.  She has severe *arthritis of the right knee and is scheduled for knee surgery by Dr. Reynaldo Minium on November 17, 2014     Current Outpatient Prescriptions  Medication Sig Dispense Refill  . Biotin 5 MG CAPS Take 1 capsule by mouth daily.      . bisoprolol (ZEBETA) 5 MG tablet TAKE 1 TABLET BY MOUTH TWICE DAILY 60 tablet 0  . Calcium Carbonate-Vitamin D (CALCIUM 600+D) 600-200 MG-UNIT TABS Take 1 tablet by mouth daily after breakfast. Dr. Justine Null     . CARTIA XT 120 MG 24 hr capsule Take 120 mg by mouth daily.     . citalopram (CELEXA) 10 MG tablet TAKE 1 TABLET BY MOUTH EVERY DAY 30 tablet 2  . clobetasol ointment (TEMOVATE) 2.63 % Apply 1 application topically 2 (two) times daily as needed (for itching).    . Eszopiclone 3 MG TABS TAKE 1 TABLET BY MOUTH EVERY NIGHT AT BEDTIME AS NEEDED 30 tablet 2  . FLUZONE HIGH-DOSE 0.5 ML SUSY   0  . gabapentin (NEURONTIN) 600 MG tablet TAKE 1 TABLET BY MOUTH THREE TIMES DAILY 90 tablet 0  . hydrOXYzine (ATARAX/VISTARIL) 10 MG tablet TAKE 1 TABLET BY MOUTH EVERY 6 HOURS AS NEEDED FOR ITCHING OR ANXIETY 60 tablet 0  . loperamide (IMODIUM) 2 MG capsule Take 2 mg by mouth as needed for diarrhea or loose stools.    . Multiple Vitamin (MULITIVITAMIN WITH MINERALS) TABS Take 1 tablet by mouth daily.    Marland Kitchen NEXIUM 40 MG capsule TAKE ONE CAPSULE BY MOUTH EVERY DAY AT 2:00 PM 30 capsule 2  . nortriptyline (PAMELOR) 25 MG capsule TAKE 2 CAPSULES BY MOUTH EVERY NIGHT AT BEDTIME 60 capsule  0  . Omega-3 Fatty Acids (OMEGA 3 PO) Take by mouth daily.    . Probiotic Product (ALIGN PO) Take by mouth daily.    . ranitidine (ZANTAC) 300 MG tablet TAKE 1 TABLET BY MOUTH AT BEDTIME 30 tablet 1  . simvastatin (ZOCOR) 40 MG tablet TAKE 1 TABLET BY MOUTH EVERY EVENING 30 tablet 2  . torsemide (DEMADEX) 10 MG tablet TAKE 1 TABLET BY MOUTH EVERY MORNING 30 tablet 0  . triamcinolone cream (KENALOG) 0.1 % Apply 1 application topically 2 (two) times daily as needed (for pruitus).    Marland Kitchen ZETIA 10 MG tablet TAKE 1 TABLET BY MOUTH EVERY DAY 30 tablet 2  . zoledronic acid (RECLAST) 5 MG/100ML SOLN Inject 5 mg into the vein once.    . warfarin (COUMADIN) 5 MG tablet Take 1 tablet (5 mg total) by mouth daily. 30 tablet 3   No current facility-administered medications for this visit.    Allergies  Allergen Reactions  . Cefuroxime Axetil Anaphylaxis    unknown  . Macrodantin Anaphylaxis  . Alendronate Sodium Other (See Comments)    Severe chest pain similar to heart attack   . Codeine Nausea And Vomiting  . Meperidine Hcl Nausea And Vomiting  . Other Other (See Comments)    Hismanal and Maprobamate  portobello  mushrooms - diarrhea  . Sulfonamide Derivatives Hives, Itching and Swelling    Past Medical History  Diagnosis Date  . Hyperlipidemia     takes Zetia and Simvastatin nightly  . Palpitation   . Carotid bruit     Right  . Edema   . Arthritis   . PONV (postoperative nausea and vomiting)   . Hyperlipidemia 01/27/2012  . Allergy     portabella mushrooms/diarrhea  . Heart murmur   . Osteoporosis   . Fibromyalgia   . Thrush 03/23/2012  . Vaginitis 03/23/2012  . Sigmoid colon ulcer 03/23/2012  . Migraine 04/27/2012  . Hyperglycemia 04/27/2012  . Dysrhythmia     paroxysmal A fib, irregular heartbeat;takes Bisoprolol nightly  . History of migraine     last about 82yrs ago  . Foot drop, right   . Joint pain   . Joint swelling   . Osteoporosis   . Chronic back pain   . Skin problem in  pregnancy     takes gabapentin 3 times a day and also has an ointment  . GERD (gastroesophageal reflux disease)     takes Zantac and Nexium daily  . Hemorrhoids   . Diverticulosis   . IBS (irritable bowel syndrome)     takes align daily  . Cataracts, bilateral   . Hypocalcemia 03/29/2013  . Depression with anxiety 03/29/2013  . Tachycardia 07/03/2013  . Onychomycosis 07/03/2013  . Adenomatous polyp of colon 1991, 2008  . Medicare annual wellness visit, subsequent 08/13/2014    Past Surgical History  Procedure Laterality Date  . Abdominal hysterectomy  1981  . Lumbar laminectomy  1960    x 2  . Veins stripped  1970  . Total hip arthroplasty Left 2012  . Carpal tunnel release Left     ulnar nerve release at elbow  . Cervical fusion  2003, 2005  . Back surgery  1997  . Tonsillectomy  1943  . Left elbow surgery  06/03/11  . Colonoscopy    . Posterior cervical fusion/foraminotomy  08/03/2012    Procedure: POSTERIOR CERVICAL FUSION/FORAMINOTOMY LEVEL 5;  Surgeon: [name], MD;  Location: [facility];  Service: Neurosurgery;  Laterality: N/A;  Cervical four to Thoracic two Posterior cervical decompression with Facet and Pedicle screw fixation  . Carpal tunnel release Right 07/23/13  . Eye surgery      b/l cataracts removed    History  Smoking status  . Former Smoker -- 4 years  . Types: Cigarettes  Smokeless tobacco  . Never Used    Comment: quit 20+yrs ago    History  Alcohol Use  . 8.4 oz/week  . 14 Glasses of wine per week    Comment: WINE WITH DINNER. 2 GLASSES RED WINE    Family History  Problem Relation Age of Onset  . Bipolar disorder Mother   . Stomach cancer Mother   . Mental illness Mother   . Stroke Father   . Colon cancer Father   . Throat cancer Maternal Uncle     larynx  . HIV Son     Aids  . Anesthesia problems Neg Hx   . Stomach cancer Maternal Grandfather   . Stomach cancer Paternal Grandmother   . Anxiety disorder Mother   . Diabetes Neg  Hx   . Heart disease Maternal Grandmother     Review of Systems: The review of systems is per the HPI.  All other systems were reviewed and are negative.  Physical Exam: BP  118/70 mmHg  Pulse 56  Ht 5\' 3"  (1.6 m)  Wt 154 lb (69.854 kg)  BMI 27.29 kg/m2 Patient is very pleasant and in no acute distress. Skin is warm and dry. Color is normal.  HEENT is unremarkable. Normocephalic/atraumatic. PERRL. Sclera are nonicteric. Neck is supple. No masses. No JVD. Lungs are clear. Cardiac exam shows a regular rate and rhythm. Abdomen is soft. Extremities are without edema. Gait and ROM are intact. No gross neurologic deficits noted.  Wt Readings from Last 3 Encounters:  08/15/14 154 lb (69.854 kg)  08/03/14 155 lb 6.4 oz (70.489 kg)  06/01/14 162 lb 6.4 oz (73.664 kg)    LABORATORY DATA/PROCEDURES: EKG showing sinus brady - rate of 52   Lab Results  Component Value Date   WBC 7.0 08/03/2014   HGB 13.3 08/03/2014   HCT 40.4 08/03/2014   PLT 237.0 08/03/2014   GLUCOSE 106* 08/03/2014   CHOL 187 08/03/2014   TRIG 150.0* 08/03/2014   HDL 45.80 08/03/2014   LDLDIRECT 93.3 08/04/2008   LDLCALC 111* 08/03/2014   ALT 26 08/03/2014   AST 27 08/03/2014   NA 139 08/03/2014   K 4.2 08/03/2014   CL 106 08/03/2014   CREATININE 0.9 08/03/2014   BUN 21 08/03/2014   CO2 18* 08/03/2014   TSH 1.77 08/03/2014   INR 1.06 05/04/2014   HGBA1C 6.0* 05/04/2014    BNP (last 3 results) No results for input(s): PROBNP in the last 8760 hours.  Echo Study Conclusions from August 2015 - Left ventricle: The cavity size was normal. There was mild concentric hypertrophy. Systolic function was normal. The estimated ejection fraction was in the range of 55% to 60%. Wall motion was normal; there were no regional wall motion abnormalities. The study is not technically sufficient to allow evaluation of LV diastolic function. - Atrial septum: No defect or patent foramen ovale was identified.   Assessment /  Plan: 1. Atrial flutter. No recurrence. Her Chadds vascular score is 4 and she will remain on long-term anticoagulation, switching from Eliquis to warfarin because of cost factors 2. HLD -   On statin therapy  3. Hypothyroidism  4.  Osteoarthritis of right knee   disposition: the patient is cleared for upcoming knee surgery in February by Dr.Aluisio. Continue current medication except for transition from Eliquis 2 warfarin. She will start warfarin 5 mg daily on December 7 intake both Eliquis and warfarin on December 7, eighth, and ninth. Starting December 10 she will continue warfarin  Alone. She will be seen in the Coumadin clinic on December 14.   recheck here for follow-up office visit and EKG in 3 months , or sooner if necessary

## 2014-08-15 NOTE — Telephone Encounter (Signed)
Patient seen for ov today. Eliquis previously denied by insurance secondary to A Flutter.  Changes documented in ov

## 2014-08-15 NOTE — Telephone Encounter (Signed)
Patient seen for ov today. She will be transitioned from Eliquis to Warfarin.  Spoke with Coumadin Clinic and she is to start Warfarin 5 mg daily and take both for 3 days. On day 4 continue with Warfarin 5 mg only See in Coumadin Clinic 1 week after starting Warfarin Spoke with patient and she will start Warfarin 5 mg daily on 12/7 and take both 12/7, 12/8, &12/9. Starting 12/10 she will continue with Warfarin only Scheduled Coumadin clinic appointment on 12/14 Reviewed with patient, verbalized understanding

## 2014-08-21 ENCOUNTER — Other Ambulatory Visit: Payer: Self-pay | Admitting: Family Medicine

## 2014-08-29 ENCOUNTER — Other Ambulatory Visit: Payer: Self-pay | Admitting: Family Medicine

## 2014-09-04 ENCOUNTER — Telehealth: Payer: Self-pay | Admitting: Cardiology

## 2014-09-04 ENCOUNTER — Ambulatory Visit (INDEPENDENT_AMBULATORY_CARE_PROVIDER_SITE_OTHER): Payer: Medicare Other | Admitting: Pharmacist

## 2014-09-04 DIAGNOSIS — I4892 Unspecified atrial flutter: Secondary | ICD-10-CM

## 2014-09-04 LAB — POCT INR: INR: 1.4

## 2014-09-04 NOTE — Progress Notes (Signed)

## 2014-09-04 NOTE — Telephone Encounter (Signed)
New Prob   Pt requesting to speak to nurse regarding coming off of Coumadin. Please call.

## 2014-09-04 NOTE — Patient Instructions (Signed)

## 2014-09-04 NOTE — Telephone Encounter (Signed)
Left message to call back  

## 2014-09-06 ENCOUNTER — Other Ambulatory Visit: Payer: Self-pay | Admitting: Family Medicine

## 2014-09-06 NOTE — Telephone Encounter (Signed)
zebeta and eszopiclone refilled per protocol. JG//CMA

## 2014-09-06 NOTE — Telephone Encounter (Signed)
Patient calling regarding changing from Warfarin to something else Explained that Warfarin was her only alternative to be covered under her insurance Patient has some concerns she will discuss with Coumadin Clinic Friday

## 2014-09-06 NOTE — Telephone Encounter (Signed)
Concerns are with diet and sun exposure Patient wanted to discuss with clinic

## 2014-09-07 NOTE — Telephone Encounter (Addendum)
Rx printed for Eszopiclone 3mg  and faxed to the pharmacy([facility]).//AB/CMA

## 2014-09-08 ENCOUNTER — Encounter: Payer: Self-pay | Admitting: Family Medicine

## 2014-09-08 ENCOUNTER — Ambulatory Visit (INDEPENDENT_AMBULATORY_CARE_PROVIDER_SITE_OTHER): Payer: Medicare Other

## 2014-09-08 DIAGNOSIS — I4892 Unspecified atrial flutter: Secondary | ICD-10-CM

## 2014-09-08 LAB — POCT INR: INR: 2

## 2014-09-11 ENCOUNTER — Other Ambulatory Visit: Payer: Self-pay | Admitting: Family Medicine

## 2014-09-12 ENCOUNTER — Ambulatory Visit (INDEPENDENT_AMBULATORY_CARE_PROVIDER_SITE_OTHER): Payer: Medicare Other | Admitting: *Deleted

## 2014-09-12 DIAGNOSIS — I4892 Unspecified atrial flutter: Secondary | ICD-10-CM

## 2014-09-12 LAB — POCT INR: INR: 2.2

## 2014-09-19 ENCOUNTER — Other Ambulatory Visit: Payer: Self-pay | Admitting: Family Medicine

## 2014-09-19 ENCOUNTER — Ambulatory Visit (INDEPENDENT_AMBULATORY_CARE_PROVIDER_SITE_OTHER): Payer: Medicare Other | Admitting: Pharmacist Clinician (PhC)/ Clinical Pharmacy Specialist

## 2014-09-19 DIAGNOSIS — I4892 Unspecified atrial flutter: Secondary | ICD-10-CM

## 2014-09-19 DIAGNOSIS — H40013 Open angle with borderline findings, low risk, bilateral: Secondary | ICD-10-CM | POA: Diagnosis not present

## 2014-09-19 DIAGNOSIS — H04123 Dry eye syndrome of bilateral lacrimal glands: Secondary | ICD-10-CM | POA: Diagnosis not present

## 2014-09-19 LAB — POCT INR: INR: 1.6

## 2014-09-19 NOTE — Telephone Encounter (Signed)
Requesting Nortriptyline 25mg -Take 2 capsules by mouth every night at bedtime. Last refill:08/21/14;#60,0 Last OV:08/03/14 Please advise.//AB/CMA

## 2014-09-25 DIAGNOSIS — N760 Acute vaginitis: Secondary | ICD-10-CM | POA: Diagnosis not present

## 2014-09-29 ENCOUNTER — Ambulatory Visit (INDEPENDENT_AMBULATORY_CARE_PROVIDER_SITE_OTHER): Payer: Medicare Other

## 2014-09-29 ENCOUNTER — Other Ambulatory Visit: Payer: Self-pay | Admitting: Family Medicine

## 2014-09-29 ENCOUNTER — Telehealth: Payer: Self-pay

## 2014-09-29 DIAGNOSIS — I4892 Unspecified atrial flutter: Secondary | ICD-10-CM

## 2014-09-29 LAB — POCT INR: INR: 2.4

## 2014-09-29 NOTE — Telephone Encounter (Signed)
Called spoke with pt made her aware of Dr Sherryl Barters recommendations for upcoming procedure.

## 2014-09-29 NOTE — Telephone Encounter (Signed)
-----   Message from Darlin Coco, MD sent at 09/29/2014 12:44 PM EST ----- Okay to hold warfarin 5 days before procedure.  She does not need bridging.  She is remaining in normal sinus rhythm. TB ----- Message -----    From: Flossie Dibble, RN    Sent: 09/29/2014  11:08 AM      To: Darlin Coco, MD  Pt is scheduled for R total knee replacement by Dr Maureen Ralphs on 11/20/14.  Pt has been instructed she will need to hold Coumadin 5 days prior to procedure.  Please advise if ok to hold Coumadin.  Thanks

## 2014-09-29 NOTE — Telephone Encounter (Signed)
Last filled:  06/20/14 Amt: 30, 2 refills Last OV/labs:  08/03/14 Next appt:  02/01/15  Med filled.

## 2014-10-02 ENCOUNTER — Other Ambulatory Visit: Payer: Self-pay | Admitting: Family Medicine

## 2014-10-05 NOTE — Telephone Encounter (Signed)
Rx's sent to the pharmacy by e-script.//AB/CMA 

## 2014-10-06 ENCOUNTER — Ambulatory Visit (INDEPENDENT_AMBULATORY_CARE_PROVIDER_SITE_OTHER): Payer: Medicare Other

## 2014-10-06 DIAGNOSIS — M797 Fibromyalgia: Secondary | ICD-10-CM | POA: Diagnosis not present

## 2014-10-06 DIAGNOSIS — I4892 Unspecified atrial flutter: Secondary | ICD-10-CM

## 2014-10-06 DIAGNOSIS — M7552 Bursitis of left shoulder: Secondary | ICD-10-CM | POA: Diagnosis not present

## 2014-10-06 DIAGNOSIS — M25512 Pain in left shoulder: Secondary | ICD-10-CM | POA: Diagnosis not present

## 2014-10-06 DIAGNOSIS — M159 Polyosteoarthritis, unspecified: Secondary | ICD-10-CM | POA: Diagnosis not present

## 2014-10-06 DIAGNOSIS — M810 Age-related osteoporosis without current pathological fracture: Secondary | ICD-10-CM | POA: Diagnosis not present

## 2014-10-06 LAB — POCT INR: INR: 1.8

## 2014-10-09 DIAGNOSIS — M1711 Unilateral primary osteoarthritis, right knee: Secondary | ICD-10-CM | POA: Diagnosis not present

## 2014-10-09 DIAGNOSIS — M179 Osteoarthritis of knee, unspecified: Secondary | ICD-10-CM | POA: Diagnosis not present

## 2014-10-12 ENCOUNTER — Other Ambulatory Visit: Payer: Self-pay | Admitting: Family Medicine

## 2014-10-12 NOTE — Telephone Encounter (Signed)
Requesting Eszopiclone 3mg -Take 1 tablet by mouth every day at bedtime as needed. Last refill:09/06/14;#30,0 Last OV:08/03/14 Please advise.//AB/CMA

## 2014-10-16 NOTE — Telephone Encounter (Signed)
Rx printed and signed and faxed.//AB/CMA

## 2014-10-17 ENCOUNTER — Other Ambulatory Visit: Payer: Self-pay | Admitting: Family Medicine

## 2014-10-17 NOTE — Telephone Encounter (Deleted)
Medication Detail      Disp Refills Start End     ZETIA 10 MG tablet 30 tablet 5 09/29/2014     Sig: TAKE 1 TABLET BY MOUTH EVERY DAY    E-Prescribing Status: Receipt confirmed by pharmacy (09/29/2014 3:38 PM EST)     Pharmacy    [id number] - , [place] - [address] AT [facility] [address] [place]

## 2014-10-17 NOTE — Telephone Encounter (Signed)
Medication Detail      Disp Refills Start End     bisoprolol (ZEBETA) 5 MG tablet 60 tablet 0 09/06/2014     Sig: TAKE 1 TABLET BY MOUTH TWICE DAILY    E-Prescribing Status: Receipt confirmed by pharmacy (09/06/2014 3:56 PM EST)     Pharmacy    [id number] - [place], [place] - [address] AT [address]   Rx request to pharmacy/SLS

## 2014-10-20 ENCOUNTER — Ambulatory Visit (INDEPENDENT_AMBULATORY_CARE_PROVIDER_SITE_OTHER): Payer: Medicare Other | Admitting: Pharmacist

## 2014-10-20 DIAGNOSIS — I4892 Unspecified atrial flutter: Secondary | ICD-10-CM | POA: Diagnosis not present

## 2014-10-20 LAB — POCT INR: INR: 5.4

## 2014-10-21 ENCOUNTER — Other Ambulatory Visit: Payer: Self-pay | Admitting: Family Medicine

## 2014-10-23 NOTE — Telephone Encounter (Signed)
Requesting Nortriptyline 25mg -Take 2 capsules by mouth every night at bedtime. Last refill:09/20/14;#60,0 Last OV:08/03/14 Please advise.//AB/CMA    Zantac rx sent to the pharmacy by e-script.//AB/CMA

## 2014-10-27 ENCOUNTER — Ambulatory Visit: Payer: Self-pay | Admitting: Orthopedic Surgery

## 2014-10-27 NOTE — Progress Notes (Signed)
Preoperative surgical orders have been place into the Epic hospital system for Chelsea Hancock on 10/27/2014, 5:06 PM  by Mickel Crow for surgery on 11-20-2014.  Preop Total Knee orders including Experal, IV Tylenol, and IV Decadron as long as there are no contraindications to the above medications. Arlee Muslim, PA-C

## 2014-10-27 NOTE — H&P (Signed)
Chelsea Hancock DOB: 04-02-1934 Divorced / Language: Chelsea Hancock / Race: White Female Date of Admission:  11/20/2014 CC:  Right knee pain History of Present Illness The patient is a 81 year old female who comes in for a preoperative History and Physical. The patient is scheduled for a right to be performed by Dr. Dione Plover. Aluisio, MD at [facility] on 11-20-2014. The patient is a 81 year old female who presents today for follow up of their knee. The patient is being followed for their right knee pain and osteoarthritis. They are now year(s) out from when symptoms began. Symptoms reported today include: pain and popping. The patient feels that they are doing poorly and report their pain level to be moderate to severe. Current treatment includes: NSAIDs (Ibuprofen (only on Wednesday when she works)). The following medication has been used for pain control: antiinflammatory medication (ibuprofen). Note for "Follow-up Knee": Last Synvisc series was done in March 2014, helped alittle bit. Unfortunately, the injections have not helped. She is at a stage now where she is ready to go ahead and get the knee fixed. This is limiting what she can and can not do. She also has a foot drop on that side and feels like that contributes to her problem. The left knee occasionally bothers her but no where near as bad as the right. She is ready to proceed with the knee replacement. They have been treated conservatively in the past for the above stated problem and despite conservative measures, they continue to have progressive pain and severe functional limitations and dysfunction. They have failed non-operative management including home exercise, medications, and injections. It is felt that they would benefit from undergoing total joint replacement. Risks and benefits of the procedure have been discussed with the patient and they elect to proceed with surgery. There are no active contraindications to surgery such  as ongoing infection or rapidly progressive neurological disease.   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) Ingrowing nail (703.0)02/07/1998 Tear, medial meniscus, knee, current (836.0)07/24/1999 Adenomatous polyp of colon (D12.6) Fibromyalgia Diverticulitis Of Colon Heart murmur Peripheral Neuropathy Osteoarthritis of right knee (M17.9) Hypercholesterolemia Gastroesophageal Reflux Disease Irritable bowel syndrome Osteoarthritis Hypothyroidism Atrial Fibrillation Atrial Flutter Palpitations Osteoporosis Vaginitis Thrush Sigmoid Colon Ulcer Migraine Headache History of Right Foot Drop Hemorrhoids Diverticulosis Cataract Bialteral Depression Anxiety Disorder Onychomycosis  Allergies Meprobamate *ANTIANXIETY AGENTS* Ceftin *CEPHALOSPORINS* Penicillin VK *PENICILLINS* local injection site pain - no rash or swelling Macrodantin *URINARY ANTI-INFECTIVES* Hismanal *ANTIHISTAMINES* Demerol *ANALGESICS - OPIOID* Nausea, Vomiting. Codeine Derivatives Nausea, Vomiting. Sulfanilamide *CHEMICALS* Hives, Swelling, Itching. Tongue Swelling  Family History Osteoarthritis grandmother mothers side and grandmother fathers side Cancer mother, father, grandfather mothers side and grandmother fathers side Heart Disease grandmother mothers side, grandfather mothers side and grandfather fathers side Hypertension father Cerebrovascular Accident father  Social History Tobacco use Former smoker. former smoker; smoke(d) less than 1/2 pack(s) per day Number of flights of stairs before winded 2-3 Children 2 Marital status divorced Illicit drug use no Living situation live alone Tobacco / smoke exposure yes outdoors only Pain Contract no Drug/Alcohol Rehab (Previously)  no Previously in rehab no Exercise Exercises daily; does other Current work status retired Alcohol use current drinker; drinks beer and wine; 8-14 per week Drug/Alcohol Rehab (Currently) no  Medication History  Gabapentin (Oral) Specific dose unknown - Active. Calcium-Vitamin D (Oral) Specific dose unknown - Active. Torsemide (10MG  Tablet,  Oral) Active. NexIUM (40MG  Capsule DR, Oral) Active. Nortriptyline HCl (50MG  Capsule, Oral) Active. Simvastatin (40MG  Tablet, Oral) Active. Zetia (10MG  Tablet, Oral) Active. Lunesta (3MG  Tablet, Oral) Active. HydrOXYzine HCl (10MG  Tablet, Oral) Active. Digestive Advantage Active. Cartia Active. (120mg ) Ranitidine HCl (300MG  Tablet, Oral) Active. Biotin (5MG  Tablet, Oral) Active. Megared Active. Citalopram Hydrobromide (10MG  Tablet, Oral) Active. Bisoprolol Fumarate (5MG  Tablet, Oral) Active. Reclast (5MG /100ML Solution, Intravenous annually) Active. Clobetasol Propionate (0.05% Cream, External) Active. Ibuprofen (400MG  Tablet, Oral) Active. Centrum Silver (Oral) Active. Baby Aspirin (81MG  Tablet Chewable, Oral) Active. Coumadin (5MG  Tablet, Oral) Active.  Past Surgical History Colon Polyp Removal - Colonoscopy Spinal Surgery 1970's Hysterectomy Date: 14. partial (non-cancerous) Neck Disc Surgery 2003, 2005, 01/05/12 Leg Circulation Surgery bilateral; 1970's - Vein Stripping Spinal Fusion lower back; 1997 Carpal Tunnel Repair Date: 06/03/2011. left Dilation and Curettage of Uterus Spinal Decompression neck and lower back, 08/03/12 Total Hip Replacement Date: 02/04/2011. left Tonsillectomy 1943 Left Wrist Surgery - Cyst Excision Left Nerve Elbow Surgery Date: 06/03/2011. Cataract Extraction-Bilateral Left 02/14/2014, Right 05/02/2014 Carpal Tunnel Surgery - Right Date: 07/2013.  Review of Systems General ROS: negative Respiratory ROS: no SOB or cough Cardiovascular ROS: no CP or  orthopnea Gastrointestinal ROS: no nausea, vomiting, constipation, diarhhea Musculoskeletal ROS: positive for knee pain Neurological ROS: negative  Vitals  Weight: 149.5 lb Height: 63in Weight was reported by patient. Body Surface Area: 1.71 m Body Mass Index: 26.48 kg/m  BP: 128/66 (Sitting, Left Arm, Standard)   Physical Exam General Mental Status -Alert, cooperative and good historian. General Appearance-pleasant, Not in acute distress. Orientation-Oriented X3. Build & Nutrition-Well nourished and Well developed.  Head and Neck Head-normocephalic, atraumatic . Neck Global Assessment - supple, no bruit auscultated on the right, no bruit auscultated on the left.  Eye Vision-Wears corrective lenses. Pupil - Bilateral-Regular and Round. Motion - Bilateral-EOMI.  Chest and Lung Exam Auscultation Breath sounds - clear at anterior chest wall and clear at posterior chest wall. Adventitious sounds - No Adventitious sounds.  Cardiovascular Auscultation Rhythm - Regular rate and rhythm. Heart Sounds - S1 WNL and S2 WNL. Murmurs & Other Heart Sounds - Auscultation of the heart reveals - No Murmurs.  Abdomen Palpation/Percussion Tenderness - Abdomen is non-tender to palpation. Rigidity (guarding) - Abdomen is soft. Auscultation Auscultation of the abdomen reveals - Bowel sounds normal.  Female Genitourinary Note: Not done, not pertinent to present illness   Musculoskeletal Note: She is alert and oriented. No apparent distress. Evaluation of her right knee shows no swelling. The range of motion is 5-130. There is marked crepitus on range of motion. She is tender medial greater than lateral with no instability noted. The left knee range is 0-130. Slight crepitus with range of motion. No tenderness or instability. Right foot shows significant weakness with dorsiflexion. She can not fully actively dorsiflex.  RADIOGRAPHS: AP both knees and lateral  show she has a significant arthritic change in both knees. She is bone on bone lateral and patellofemoral on the right. She has some bone on bone changes on the left also.   Assessment & Plan Primary osteoarthritis of right knee (M17.11) Note:Surgical Plans: Right Total Knee Repalcement  Disposition: Home  PCP: Dr. Charlett Blake - Patient has been seen preoperatively and felt to be stable for surgery. Cards: Dr. [name] - Clearance note is pending however EPIC note stated the patient was "Okay to hold warfarin 5 days before procedure. She does not need bridging. she is remaining in normal sinus rhythm."  IV TXA  Anesthesia Issues: None  Please note that the patient is currently taking Coumadin for heart issues and has been instructed to stop the Coumadin 5 days prior to surgery. She will be placed back onto her Coumadin postop along with a Lovenox bridge postoperatively until the Coumadin is therapeutic.  Signed electronically by Joelene Millin, III PA-C

## 2014-10-27 NOTE — H&P (Signed)
Chelsea Hancock DOB: 11-10-1933 Divorced / Language: [name] / Race: White Female Date of Admission: 11/20/2014 CC: Right knee pain History of Present Illness The patient is a 81 year old female who comes in for a preoperative History and Physical. The patient is scheduled for a right to be performed by Dr. Dione Plover. Aluisio, MD at [facility] on 11-20-2014. The patient is a 81 year old female who presents today for follow up of their knee. The patient is being followed for their right knee pain and osteoarthritis. They are now year(s) out from when symptoms began. Symptoms reported today include: pain and popping. The patient feels that they are doing poorly and report their pain level to be moderate to severe. Current treatment includes: NSAIDs (Ibuprofen (only on Wednesday when she works)). The following medication has been used for pain control: antiinflammatory medication (ibuprofen). Note for "Follow-up Knee": Last Synvisc series was done in March 2014, helped alittle bit. Unfortunately, the injections have not helped. She is at a stage now where she is ready to go ahead and get the knee fixed. This is limiting what she can and can not do. She also has a foot drop on that side and feels like that contributes to her problem. The left knee occasionally bothers her but no where near as bad as the right. She is ready to proceed with the knee replacement. They have been treated conservatively in the past for the above stated problem and despite conservative measures, they continue to have progressive pain and severe functional limitations and dysfunction. They have failed non-operative management including home exercise, medications, and injections. It is felt that they would benefit from undergoing total joint replacement. Risks and benefits of the procedure have been discussed with the patient and they elect to proceed with surgery. There are no active contraindications to surgery such  as ongoing infection or rapidly progressive neurological disease.   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) Ingrowing nail (703.0)02/07/1998 Tear, medial meniscus, knee, current (836.0)07/24/1999 Adenomatous polyp of colon (D12.6) Fibromyalgia Diverticulitis Of Colon Heart murmur Peripheral Neuropathy Osteoarthritis of right knee (M17.9) Hypercholesterolemia Gastroesophageal Reflux Disease Irritable bowel syndrome Osteoarthritis Hypothyroidism Atrial Fibrillation Atrial Flutter Palpitations Osteoporosis Vaginitis Thrush Sigmoid Colon Ulcer Migraine Headache History of Right Foot Drop Hemorrhoids Diverticulosis Cataract Bialteral Depression Anxiety Disorder Onychomycosis  Allergies Meprobamate *ANTIANXIETY AGENTS* Ceftin *CEPHALOSPORINS* Penicillin VK *PENICILLINS* local injection site pain - no rash or swelling Macrodantin *URINARY ANTI-INFECTIVES* Hismanal *ANTIHISTAMINES* Demerol *ANALGESICS - OPIOID* Nausea, Vomiting. Codeine Derivatives Nausea, Vomiting. Sulfanilamide *CHEMICALS* Hives, Swelling, Itching. Tongue Swelling  Family History Osteoarthritis grandmother mothers side and grandmother fathers side Cancer mother, father, grandfather mothers side and grandmother fathers side Heart Disease grandmother mothers side, grandfather mothers side and grandfather fathers side Hypertension father Cerebrovascular Accident father  Social History Tobacco use Former smoker. former smoker; smoke(d) less than 1/2 pack(s) per day Number of flights of stairs before winded 2-3 Children 2 Marital status divorced Illicit drug use no Living situation live alone Tobacco / smoke exposure yes outdoors only Pain Contract no Drug/Alcohol Rehab (Previously)  no Previously in rehab no Exercise Exercises daily; does other Current work status retired Alcohol use current drinker; drinks beer and wine; 8-14 per week Drug/Alcohol Rehab (Currently) no  Medication History  Gabapentin (Oral) Specific dose unknown - Active. Calcium-Vitamin D (Oral) Specific dose unknown - Active. Torsemide (10MG  Tablet, Oral) Active.  NexIUM (40MG  Capsule DR, Oral) Active. Nortriptyline HCl (50MG  Capsule, Oral) Active. Simvastatin (40MG  Tablet, Oral) Active. Zetia (10MG  Tablet, Oral) Active. Lunesta (3MG  Tablet, Oral) Active. HydrOXYzine HCl (10MG  Tablet, Oral) Active. Digestive Advantage Active. Cartia Active. (120mg ) Ranitidine HCl (300MG  Tablet, Oral) Active. Biotin (5MG  Tablet, Oral) Active. Megared Active. Citalopram Hydrobromide (10MG  Tablet, Oral) Active. Bisoprolol Fumarate (5MG  Tablet, Oral) Active. Reclast (5MG /100ML Solution, Intravenous annually) Active. Clobetasol Propionate (0.05% Cream, External) Active. Ibuprofen (400MG  Tablet, Oral) Active. Centrum Silver (Oral) Active. Baby Aspirin (81MG  Tablet Chewable, Oral) Active. Coumadin (5MG  Tablet, Oral) Active.  Past Surgical History Colon Polyp Removal - Colonoscopy Spinal Surgery 1970's Hysterectomy Date: 72. partial (non-cancerous) Neck Disc Surgery 2003, 2005, 01/05/12 Leg Circulation Surgery bilateral; 1970's - Vein Stripping Spinal Fusion lower back; 1997 Carpal Tunnel Repair Date: 06/03/2011. left Dilation and Curettage of Uterus Spinal Decompression neck and lower back, 08/03/12 Total Hip Replacement Date: 02/04/2011. left Tonsillectomy 1943 Left Wrist Surgery - Cyst Excision Left Nerve Elbow Surgery Date: 06/03/2011. Cataract Extraction-Bilateral Left 02/14/2014, Right 05/02/2014 Carpal Tunnel Surgery - Right Date: 07/2013.  Review of Systems General ROS: negative Respiratory ROS: no SOB or cough Cardiovascular ROS: no CP or  orthopnea Gastrointestinal ROS: no nausea, vomiting, constipation, diarhhea Musculoskeletal ROS: positive for knee pain Neurological ROS: negative  Vitals  Weight: 149.5 lb Height: 63in Weight was reported by patient. Body Surface Area: 1.71 m Body Mass Index: 26.48 kg/m  BP: 128/66 (Sitting, Left Arm, Standard)   Physical Exam General Mental Status -Alert, cooperative and good historian. General Appearance-pleasant, Not in acute distress. Orientation-Oriented X3. Build & Nutrition-Well nourished and Well developed.  Head and Neck Head-normocephalic, atraumatic . Neck Global Assessment - supple, no bruit auscultated on the right, no bruit auscultated on the left.  Eye Vision-Wears corrective lenses. Pupil - Bilateral-Regular and Round. Motion - Bilateral-EOMI.  Chest and Lung Exam Auscultation Breath sounds - clear at anterior chest wall and clear at posterior chest wall. Adventitious sounds - No Adventitious sounds.  Cardiovascular Auscultation Rhythm - Regular rate and rhythm. Heart Sounds - S1 WNL and S2 WNL. Murmurs & Other Heart Sounds - Auscultation of the heart reveals - No Murmurs.  Abdomen Palpation/Percussion Tenderness - Abdomen is non-tender to palpation. Rigidity (guarding) - Abdomen is soft. Auscultation Auscultation of the abdomen reveals - Bowel sounds normal.  Female Genitourinary Note: Not done, not pertinent to present illness   Musculoskeletal Note: She is alert and oriented. No apparent distress. Evaluation of her right knee shows no swelling. The range of motion is 5-130. There is marked crepitus on range of motion. She is tender medial greater than lateral with no instability noted. The left knee range is 0-130. Slight crepitus with range of motion. No tenderness or instability. Right foot shows significant weakness with dorsiflexion. She can not fully actively dorsiflex.  RADIOGRAPHS: AP both knees and lateral  show she has a significant arthritic change in both knees. She is bone on bone lateral and patellofemoral on the right. She has some bone on bone changes on the left also.   Assessment & Plan Primary osteoarthritis of right knee (M17.11) Note:Surgical Plans: Right Total Knee Repalcement  Disposition: Home  PCP: Dr. Charlett Blake - Patient has been seen preoperatively and felt to be stable for surgery. Cards: Dr. [name] - Clearance note is pending however EPIC note stated the patient was "Okay to hold warfarin 5 days before procedure. She does not need bridging. she is remaining in normal sinus rhythm."  IV TXA  Anesthesia Issues:  None  Please note that the patient is currently taking Coumadin for heart issues and has been instructed to stop the Coumadin 5 days prior to surgery. She will be placed back onto her Coumadin postop along with a Lovenox bridge postoperatively until the Coumadin is therapeutic.  Signed electronically by Joelene Millin, III PA-C

## 2014-10-30 ENCOUNTER — Ambulatory Visit (INDEPENDENT_AMBULATORY_CARE_PROVIDER_SITE_OTHER): Payer: Medicare Other | Admitting: Pharmacist

## 2014-10-30 DIAGNOSIS — I4892 Unspecified atrial flutter: Secondary | ICD-10-CM

## 2014-10-30 LAB — POCT INR: INR: 2.4

## 2014-10-31 ENCOUNTER — Ambulatory Visit: Payer: Self-pay | Admitting: Orthopedic Surgery

## 2014-10-31 NOTE — H&P (Signed)
Chelsea Hancock DOB: [birth year] Divorced / Language: [name] / Race: White Female Date of Admission: 11/20/2014 CC: Right knee pain History of Present Illness The patient is a 81 year old female who comes in for a preoperative History and Physical. The patient is scheduled for a right to be performed by Dr. Dione Plover. Aluisio, MD at [facility] on 11-20-2014. The patient is a 81 year old female who presents today for follow up of their knee. The patient is being followed for their right knee pain and osteoarthritis. They are now year(s) out from when symptoms began. Symptoms reported today include: pain and popping. The patient feels that they are doing poorly and report their pain level to be moderate to severe. Current treatment includes: NSAIDs (Ibuprofen (only on Wednesday when she works)). The following medication has been used for pain control: antiinflammatory medication (ibuprofen). Note for "Follow-up Knee": Last Synvisc series was done in March 2014, helped alittle bit. Unfortunately, the injections have not helped. She is at a stage now where she is ready to go ahead and get the knee fixed. This is limiting what she can and can not do. She also has a foot drop on that side and feels like that contributes to her problem. The left knee occasionally bothers her but no where near as bad as the right. She is ready to proceed with the knee replacement. They have been treated conservatively in the past for the above stated problem and despite conservative measures, they continue to have progressive pain and severe functional limitations and dysfunction. They have failed non-operative management including home exercise, medications, and injections. It is felt that they would benefit from undergoing total joint replacement. Risks and benefits of the procedure have been discussed with the patient and they elect to proceed with surgery. There are no active contraindications to surgery such  as ongoing infection or rapidly progressive neurological disease.   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) Ingrowing nail (703.0)02/07/1998 Tear, medial meniscus, knee, current (836.0)07/24/1999 Adenomatous polyp of colon (D12.6) Fibromyalgia Diverticulitis Of Colon Heart murmur Peripheral Neuropathy Osteoarthritis of right knee (M17.9) Hypercholesterolemia Gastroesophageal Reflux Disease Irritable bowel syndrome Osteoarthritis Hypothyroidism Atrial Fibrillation Atrial Flutter Palpitations Osteoporosis Vaginitis Thrush Sigmoid Colon Ulcer Migraine Headache History of Right Foot Drop Hemorrhoids Diverticulosis Cataract Bialteral Depression Anxiety Disorder Onychomycosis  Allergies Meprobamate *ANTIANXIETY AGENTS* Ceftin *CEPHALOSPORINS* Penicillin VK *PENICILLINS* local injection site pain - no rash or swelling Macrodantin *URINARY ANTI-INFECTIVES* Hismanal *ANTIHISTAMINES* Demerol *ANALGESICS - OPIOID* Nausea, Vomiting. Codeine Derivatives Nausea, Vomiting. Sulfanilamide *CHEMICALS* Hives, Swelling, Itching. Tongue Swelling  Family History Osteoarthritis grandmother mothers side and grandmother fathers side Cancer mother, father, grandfather mothers side and grandmother fathers side Heart Disease grandmother mothers side, grandfather mothers side and grandfather fathers side Hypertension father Cerebrovascular Accident father  Social History Tobacco use Former smoker. former smoker; smoke(d) less than 1/2 pack(s) per day Number of flights of stairs before winded 2-3 Children 2 Marital status divorced Illicit drug use no Living situation live alone Tobacco / smoke exposure yes outdoors only Pain Contract no Drug/Alcohol Rehab (Previously)  no Previously in rehab no Exercise Exercises daily; does other Current work status retired Alcohol use current drinker; drinks beer and wine; 8-14 per week Drug/Alcohol Rehab (Currently) no  Medication History  Gabapentin (Oral) Specific dose unknown - Active. Calcium-Vitamin D (Oral) Specific dose unknown - Active. Torsemide (10MG  Tablet, Oral) Active.  NexIUM (40MG  Capsule DR, Oral) Active. Nortriptyline HCl (50MG  Capsule, Oral) Active. Simvastatin (40MG  Tablet, Oral) Active. Zetia (10MG  Tablet, Oral) Active. Lunesta (3MG  Tablet, Oral) Active. HydrOXYzine HCl (10MG  Tablet, Oral) Active. Digestive Advantage Active. Cartia Active. (120mg ) Ranitidine HCl (300MG  Tablet, Oral) Active. Biotin (5MG  Tablet, Oral) Active. Megared Active. Citalopram Hydrobromide (10MG  Tablet, Oral) Active. Bisoprolol Fumarate (5MG  Tablet, Oral) Active. Reclast (5MG /100ML Solution, Intravenous annually) Active. Clobetasol Propionate (0.05% Cream, External) Active. Ibuprofen (400MG  Tablet, Oral) Active. Centrum Silver (Oral) Active. Baby Aspirin (81MG  Tablet Chewable, Oral) Active. Coumadin (5MG  Tablet, Oral) Active.  Past Surgical History Colon Polyp Removal - Colonoscopy Spinal Surgery 1970's Hysterectomy Date: 98. partial (non-cancerous) Neck Disc Surgery 2003, 2005, 01/05/12 Leg Circulation Surgery bilateral; 1970's - Vein Stripping Spinal Fusion lower back; 1997 Carpal Tunnel Repair Date: 06/03/2011. left Dilation and Curettage of Uterus Spinal Decompression neck and lower back, 08/03/12 Total Hip Replacement Date: 02/04/2011. left Tonsillectomy 1943 Left Wrist Surgery - Cyst Excision Left Nerve Elbow Surgery Date: 06/03/2011. Cataract Extraction-Bilateral Left 02/14/2014, Right 05/02/2014 Carpal Tunnel Surgery - Right Date: 07/2013.  Review of Systems General ROS: negative Respiratory ROS: no SOB or cough Cardiovascular ROS: no CP or  orthopnea Gastrointestinal ROS: no nausea, vomiting, constipation, diarhhea Musculoskeletal ROS: positive for knee pain Neurological ROS: negative  Vitals  Weight: 149.5 lb Height: 63in Weight was reported by patient. Body Surface Area: 1.71 m Body Mass Index: 26.48 kg/m  BP: 128/66 (Sitting, Left Arm, Standard)   Physical Exam General Mental Status -Alert, cooperative and good historian. General Appearance-pleasant, Not in acute distress. Orientation-Oriented X3. Build & Nutrition-Well nourished and Well developed.  Head and Neck Head-normocephalic, atraumatic . Neck Global Assessment - supple, no bruit auscultated on the right, no bruit auscultated on the left.  Eye Vision-Wears corrective lenses. Pupil - Bilateral-Regular and Round. Motion - Bilateral-EOMI.  Chest and Lung Exam Auscultation Breath sounds - clear at anterior chest wall and clear at posterior chest wall. Adventitious sounds - No Adventitious sounds.  Cardiovascular Auscultation Rhythm - Regular rate and rhythm. Heart Sounds - S1 WNL and S2 WNL. Murmurs & Other Heart Sounds - Auscultation of the heart reveals - No Murmurs.  Abdomen Palpation/Percussion Tenderness - Abdomen is non-tender to palpation. Rigidity (guarding) - Abdomen is soft. Auscultation Auscultation of the abdomen reveals - Bowel sounds normal.  Female Genitourinary Note: Not done, not pertinent to present illness   Musculoskeletal Note: She is alert and oriented. No apparent distress. Evaluation of her right knee shows no swelling. The range of motion is 5-130. There is marked crepitus on range of motion. She is tender medial greater than lateral with no instability noted. The left knee range is 0-130. Slight crepitus with range of motion. No tenderness or instability. Right foot shows significant weakness with dorsiflexion. She can not fully actively dorsiflex.  RADIOGRAPHS: AP both knees and lateral  show she has a significant arthritic change in both knees. She is bone on bone lateral and patellofemoral on the right. She has some bone on bone changes on the left also.   Assessment & Plan Primary osteoarthritis of right knee (M17.11) Note:Surgical Plans: Right Total Knee Repalcement  Disposition: Home  PCP: Dr. Charlett Blake - Patient has been seen preoperatively and felt to be stable for surgery. Cards: Dr. [name] - Clearance note is pending however EPIC note stated the patient was "Okay to hold warfarin 5 days before procedure. She does not need bridging. she is remaining in normal sinus rhythm."  IV TXA  Anesthesia Issues:  None  Please note that the patient is currently taking Coumadin for heart issues and has been instructed to stop the Coumadin 5 days prior to surgery. She will be placed back onto her Coumadin postop along with a Lovenox bridge postoperatively until the Coumadin is therapeutic.  Signed electronically by Joelene Millin, III PA-C

## 2014-11-04 ENCOUNTER — Other Ambulatory Visit: Payer: Self-pay | Admitting: Family Medicine

## 2014-11-06 NOTE — Telephone Encounter (Signed)
Faxed to [place]

## 2014-11-06 NOTE — Telephone Encounter (Signed)
Faxed hardcopy for Escopiclone to [employer]

## 2014-11-13 ENCOUNTER — Other Ambulatory Visit (HOSPITAL_COMMUNITY): Payer: Self-pay | Admitting: *Deleted

## 2014-11-13 NOTE — Patient Instructions (Addendum)
Chelsea Hancock  11/13/2014   Your procedure is scheduled on: 11/20/14   Report to [facility] Main  Entrance and follow signs to               Chelsea Hancock at 5:20 AM.   Call this number if you have problems the morning of surgery [phone]   Remember:  Do not eat food or drink liquids :After Midnight.    Take these medicines the morning of surgery with A SIP OF WATER: bisoprolol, cartia, celexa, gabapentin                               You may not have any metal on your body including hair pins and              piercings  Do not wear jewelry, make-up, lotions, powders or perfumes.             Do not wear nail polish.  Do not shave  48 hours prior to surgery.              Men may shave face and neck.  Do not bring valuables to the hospital. [place].  Contacts, dentures or bridgework may not be worn into surgery.  Leave suitcase in the car. After surgery it may be brought to your room.              Please read over the following fact sheets you were given: MRSA information  _____________________________________________________________________                                                    [name]  Before surgery, you can play an important role.  Because skin is not sterile, your skin needs to be as free of germs as possible.  You can reduce the number of germs on your skin by washing with CHG (chlorahexidine gluconate) soap before surgery.  CHG is an antiseptic cleaner which kills germs and bonds with the skin to continue killing germs even after washing. Please DO NOT use if you have an allergy to CHG or antibacterial soaps.  If your skin becomes reddened/irritated stop using the CHG and inform your nurse when you arrive at Short Stay. Do not shave (including legs and underarms) for at least 48 hours prior to the first CHG shower.  You may shave your face. Please  follow these instructions carefully:   1.  Shower with CHG Soap the night before surgery and the  morning of Surgery.   2.  If you choose to wash your hair, wash your hair first as usual with your  normal  Shampoo.   3.  After you shampoo, rinse your hair and body thoroughly to remove the  shampoo.                                         4.  Use CHG as you would any other liquid soap.  You  can apply chg directly  to the skin and wash . Gently wash with scrungie or clean wascloth    5.  Apply the CHG Soap to your body ONLY FROM THE NECK DOWN.   Do not use on open                           Wound or open sores. Avoid contact with eyes, ears mouth and genitals (private parts).                        Genitals (private parts) with your normal soap.              6.  Wash thoroughly, paying special attention to the area where your surgery  will be performed.   7.  Thoroughly rinse your body with warm water from the neck down.   8.  DO NOT shower/wash with your normal soap after using and rinsing off  the CHG Soap .                9.  Pat yourself dry with a clean towel.             10.  Wear clean pajamas.             11.  Place clean sheets on your bed the night of your first shower and do not  sleep with pets.  Day of Surgery : Do not apply any lotions/deodorants the morning of surgery.  Please wear clean clothes to the hospital/surgery center.  FAILURE TO FOLLOW THESE INSTRUCTIONS MAY RESULT IN THE CANCELLATION OF YOUR SURGERY    PATIENT SIGNATURE_________________________________  ______________________________________________________________________     Chelsea Hancock  An incentive spirometer is a tool that can help keep your lungs clear and active. This tool measures how well you are filling your lungs with each breath. Taking long deep breaths may help reverse or decrease the chance of developing breathing (pulmonary) problems (especially infection) following:  A long  period of time when you are unable to move or be active. BEFORE THE PROCEDURE   If the spirometer includes an indicator to show your best effort, your nurse or respiratory therapist will set it to a desired goal.  If possible, sit up straight or lean slightly forward. Try not to slouch.  Hold the incentive spirometer in an upright position. INSTRUCTIONS FOR USE   Sit on the edge of your bed if possible, or sit up as far as you can in bed or on a chair.  Hold the incentive spirometer in an upright position.  Breathe out normally.  Place the mouthpiece in your mouth and seal your lips tightly around it.  Breathe in slowly and as deeply as possible, raising the piston or the ball toward the top of the column.  Hold your breath for 3-5 seconds or for as long as possible. Allow the piston or ball to fall to the bottom of the column.  Remove the mouthpiece from your mouth and breathe out normally.  Rest for a few seconds and repeat Steps 1 through 7 at least 10 times every 1-2 hours when you are awake. Take your time and take a few normal breaths between deep breaths.  The spirometer may include an indicator to show your best effort. Use the indicator as a goal to work toward during each repetition.  After each set of 10 deep breaths, practice coughing to  be sure your lungs are clear. If you have an incision (the cut made at the time of surgery), support your incision when coughing by placing a pillow or rolled up towels firmly against it. Once you are able to get out of bed, walk around indoors and cough well. You may stop using the incentive spirometer when instructed by your caregiver.  RISKS AND COMPLICATIONS  Take your time so you do not get dizzy or light-headed.  If you are in pain, you may need to take or ask for pain medication before doing incentive spirometry. It is harder to take a deep breath if you are having pain. AFTER USE  Rest and breathe slowly and easily.  It can  be helpful to keep track of a log of your progress. Your caregiver can provide you with a simple table to help with this. If you are using the spirometer at home, follow these instructions: Chelsea Hancock IF:   You are having difficultly using the spirometer.  You have trouble using the spirometer as often as instructed.  Your pain medication is not giving enough relief while using the spirometer.  You develop fever of 100.5 F (38.1 C) or higher. SEEK IMMEDIATE MEDICAL CARE IF:   You cough up bloody sputum that had not been present before.  You develop fever of 102 F (38.9 C) or greater.  You develop worsening pain at or near the incision site. MAKE SURE YOU:   Understand these instructions.  Will watch your condition.  Will get help right away if you are not doing well or get worse. Document Released: 01/19/2007 Document Revised: 12/01/2011 Document Reviewed: 03/22/2007 Exit[employer] Patient Information 2014 Exit[employer], [place].   ________________________________________________________________________  WHAT IS A BLOOD TRANSFUSION? Blood Transfusion Information  A transfusion is the replacement of blood or some of its parts. Blood is made up of multiple cells which provide different functions.  Red blood cells carry oxygen and are used for blood loss replacement.  White blood cells fight against infection.  Platelets control bleeding.  Plasma helps clot blood.  Other blood products are available for specialized needs, such as hemophilia or other clotting disorders. BEFORE THE TRANSFUSION  Who gives blood for transfusions?   Healthy volunteers who are fully evaluated to make sure their blood is safe. This is blood bank blood. Transfusion therapy is the safest it has ever been in the practice of medicine. Before blood is taken from a donor, a complete history is taken to make sure that person has no history of diseases nor engages in risky social behavior (examples are  intravenous drug use or sexual activity with multiple partners). The donor's travel history is screened to minimize risk of transmitting infections, such as malaria. The donated blood is tested for signs of infectious diseases, such as HIV and hepatitis. The blood is then tested to be sure it is compatible with you in order to minimize the chance of a transfusion reaction. If you or a relative donates blood, this is often done in anticipation of surgery and is not appropriate for emergency situations. It takes many days to process the donated blood. RISKS AND COMPLICATIONS Although transfusion therapy is very safe and saves many lives, the main dangers of transfusion include:   Getting an infectious disease.  Developing a transfusion reaction. This is an allergic reaction to something in the blood you were given. Every precaution is taken to prevent this. The decision to have a blood transfusion has been considered carefully by  your caregiver before blood is given. Blood is not given unless the benefits outweigh the risks. AFTER THE TRANSFUSION  Right after receiving a blood transfusion, you will usually feel much better and more energetic. This is especially true if your red blood cells have gotten low (anemic). The transfusion raises the level of the red blood cells which carry oxygen, and this usually causes an energy increase.  The nurse administering the transfusion will monitor you carefully for complications. HOME CARE INSTRUCTIONS  No special instructions are needed after a transfusion. You may find your energy is better. Speak with your caregiver about any limitations on activity for underlying diseases you may have. SEEK MEDICAL CARE IF:   Your condition is not improving after your transfusion.  You develop redness or irritation at the intravenous (IV) site. SEEK IMMEDIATE MEDICAL CARE IF:  Any of the following symptoms occur over the next 12 hours:  Shaking chills.  You have a  temperature by mouth above 102 F (38.9 C), not controlled by medicine.  Chest, back, or muscle pain.  People around you feel you are not acting correctly or are confused.  Shortness of breath or difficulty breathing.  Dizziness and fainting.  You get a rash or develop hives.  You have a decrease in urine output.  Your urine turns a dark color or changes to pink, red, or brown. Any of the following symptoms occur over the next 10 days:  You have a temperature by mouth above 102 F (38.9 C), not controlled by medicine.  Shortness of breath.  Weakness after normal activity.  The white part of the eye turns yellow (jaundice).  You have a decrease in the amount of urine or are urinating less often.  Your urine turns a dark color or changes to pink, red, or brown. Document Released: 09/05/2000 Document Revised: 12/01/2011 Document Reviewed: 04/24/2008 [facility] Patient Information 2014 [place], [place].  _______________________________________________________________________

## 2014-11-14 ENCOUNTER — Encounter (HOSPITAL_COMMUNITY)
Admission: RE | Admit: 2014-11-14 | Discharge: 2014-11-14 | Disposition: A | Discharge status: Home / Self Care | Payer: Medicare Other | Source: Ambulatory Visit | Attending: Orthopedic Surgery | Admitting: Orthopedic Surgery

## 2014-11-14 ENCOUNTER — Encounter (HOSPITAL_COMMUNITY): Payer: Self-pay

## 2014-11-14 ENCOUNTER — Ambulatory Visit: Payer: Self-pay | Admitting: Orthopedic Surgery

## 2014-11-14 DIAGNOSIS — M179 Osteoarthritis of knee, unspecified: Secondary | ICD-10-CM | POA: Insufficient documentation

## 2014-11-14 DIAGNOSIS — Z7901 Long term (current) use of anticoagulants: Secondary | ICD-10-CM | POA: Diagnosis not present

## 2014-11-14 DIAGNOSIS — Z01818 Encounter for other preprocedural examination: Secondary | ICD-10-CM | POA: Diagnosis not present

## 2014-11-14 LAB — COMPREHENSIVE METABOLIC PANEL
ALT: 26 U/L (ref 0–35)
AST: 32 U/L (ref 0–37)
Albumin: 4.1 g/dL (ref 3.5–5.2)
Alkaline Phosphatase: 78 U/L (ref 39–117)
Anion gap: 7 (ref 5–15)
BUN: 24 mg/dL — ABNORMAL HIGH (ref 6–23)
CO2: 28 mmol/L (ref 19–32)
Calcium: 9.6 mg/dL (ref 8.4–10.5)
Chloride: 106 mmol/L (ref 96–112)
Creatinine, Ser: 0.77 mg/dL (ref 0.50–1.10)
GFR calc Af Amer: >90 mL/min (ref >90)
GFR calc non Af Amer: 78 mL/min — ABNORMAL LOW (ref >90)
Glucose, Bld: 106 mg/dL — ABNORMAL HIGH (ref 70–99)
Potassium: 4.3 mmol/L (ref 3.5–5.1)
Sodium: 141 mmol/L (ref 135–145)
Total Bilirubin: 0.5 mg/dL (ref 0.3–1.2)
Total Protein: 6.8 g/dL (ref 6.0–8.3)

## 2014-11-14 LAB — URINALYSIS, ROUTINE W REFLEX MICROSCOPIC
Bilirubin Urine: NEGATIVE
Glucose, UA: NEGATIVE mg/dL
Ketones, ur: NEGATIVE mg/dL
Nitrite: NEGATIVE
Protein, ur: NEGATIVE mg/dL
Specific Gravity, Urine: 1.025 (ref 1.005–1.030)
Urobilinogen, UA: 0.2 mg/dL (ref 0.0–1.0)
pH: 6 (ref 5.0–8.0)

## 2014-11-14 LAB — SURGICAL PCR SCREEN
MRSA, PCR: NEGATIVE
Staphylococcus aureus: POSITIVE — AB

## 2014-11-14 LAB — CBC
HCT: 41 % (ref 36.0–46.0)
Hemoglobin: 13.3 g/dL (ref 12.0–15.0)
MCH: 30 pg (ref 26.0–34.0)
MCHC: 32.4 g/dL (ref 30.0–36.0)
MCV: 92.3 fL (ref 78.0–100.0)
Platelets: 228 10*3/uL (ref 150–400)
RBC: 4.44 MIL/uL (ref 3.87–5.11)
RDW: 13.4 % (ref 11.5–15.5)
WBC: 6.5 10*3/uL (ref 4.0–10.5)

## 2014-11-14 LAB — PROTIME-INR
INR: 2.4 — ABNORMAL HIGH (ref 0.00–1.49)
Prothrombin Time: 26.3 seconds — ABNORMAL HIGH (ref 11.6–15.2)

## 2014-11-14 LAB — URINE MICROSCOPIC-ADD ON

## 2014-11-14 LAB — APTT: aPTT: 43 seconds — ABNORMAL HIGH (ref 24–37)

## 2014-11-14 NOTE — Progress Notes (Addendum)
Chest x-ray 01/24/14 on EPIC, EKG 08/15/14 on EPIC, surgery clearance note Dr. Charlett Blake 08/10/14 on chart

## 2014-11-14 NOTE — Progress Notes (Signed)
Repeat Protime and INR was added and ordered to de run upon arrival the day of surgery of 11/20/2014. Arlee Muslim, PA-C

## 2014-11-14 NOTE — Progress Notes (Signed)
PT,PTT,CMP, U/A and micro results done 11/14/2014 faxed via EPIC to Dr Wynelle Link.

## 2014-11-17 NOTE — Progress Notes (Signed)
Patient called and instructed to arrive in short stay at [facility] parking is running at that time

## 2014-11-19 NOTE — Anesthesia Preprocedure Evaluation (Addendum)
Anesthesia Evaluation  Patient identified by MRN, date of birth, ID band Patient awake    Reviewed: Allergy & Precautions, NPO status , Patient's Chart, lab work & pertinent test results  History of Anesthesia Complications (+) PONV and history of anesthetic complications  Airway Mallampati: II  TM Distance: >3 FB Neck ROM: Full    Dental no notable dental hx. (+) Dental Advisory Given   Pulmonary former smoker,  breath sounds clear to auscultation  Pulmonary exam normal       Cardiovascular + dysrhythmias Atrial Fibrillation Rhythm:Regular Rate:Normal  the patient is cleared for upcoming knee surgery in February by Dr.Aluisio. Continue current medication except for transition from Eliquis 2 warfarin   Neuro/Psych  Headaches, PSYCHIATRIC DISORDERS Anxiety negative psych ROS   GI/Hepatic negative GI ROS, Neg liver ROS, GERD-  Medicated and Controlled,  Endo/Other  Hypothyroidism   Renal/GU negative Renal ROS  negative genitourinary   Musculoskeletal  (+) Arthritis -, Fibromyalgia -  Abdominal   Peds negative pediatric ROS (+)  Hematology negative hematology ROS (+)   Anesthesia Other Findings   Reproductive/Obstetrics negative OB ROS                           Anesthesia Physical Anesthesia Plan  ASA: III  Anesthesia Plan: General   Post-op Pain Management:    Induction: Intravenous  Airway Management Planned: Oral ETT  Additional Equipment:   Intra-op Plan:   Post-operative Plan: Extubation in OR  Informed Consent: I have reviewed the patients History and Physical, chart, labs and discussed the procedure including the risks, benefits and alternatives for the proposed anesthesia with the patient or authorized representative who has indicated his/her understanding and acceptance.   Dental advisory given  Plan Discussed with: CRNA  Anesthesia Plan Comments:         Anesthesia Quick Evaluation

## 2014-11-20 ENCOUNTER — Inpatient Hospital Stay (HOSPITAL_COMMUNITY)
Admission: RE | Admit: 2014-11-20 | Discharge: 2014-11-22 | DRG: 470 | Disposition: A | Discharge status: Home / Self Care | Payer: Medicare Other | Source: Ambulatory Visit | Attending: Orthopedic Surgery | Admitting: Orthopedic Surgery

## 2014-11-20 ENCOUNTER — Encounter (HOSPITAL_COMMUNITY)
Admission: RE | Disposition: A | Discharge status: Home / Self Care | Payer: Self-pay | Source: Ambulatory Visit | Attending: Orthopedic Surgery

## 2014-11-20 ENCOUNTER — Inpatient Hospital Stay (HOSPITAL_COMMUNITY): Payer: Medicare Other | Admitting: Anesthesiology

## 2014-11-20 ENCOUNTER — Encounter (HOSPITAL_COMMUNITY): Payer: Self-pay | Admitting: *Deleted

## 2014-11-20 DIAGNOSIS — G43909 Migraine, unspecified, not intractable, without status migrainosus: Secondary | ICD-10-CM | POA: Diagnosis present

## 2014-11-20 DIAGNOSIS — M1711 Unilateral primary osteoarthritis, right knee: Principal | ICD-10-CM

## 2014-11-20 DIAGNOSIS — E039 Hypothyroidism, unspecified: Secondary | ICD-10-CM | POA: Diagnosis present

## 2014-11-20 DIAGNOSIS — F329 Major depressive disorder, single episode, unspecified: Secondary | ICD-10-CM | POA: Diagnosis not present

## 2014-11-20 DIAGNOSIS — F419 Anxiety disorder, unspecified: Secondary | ICD-10-CM | POA: Diagnosis present

## 2014-11-20 DIAGNOSIS — E785 Hyperlipidemia, unspecified: Secondary | ICD-10-CM | POA: Diagnosis present

## 2014-11-20 DIAGNOSIS — Z888 Allergy status to other drugs, medicaments and biological substances status: Secondary | ICD-10-CM

## 2014-11-20 DIAGNOSIS — I480 Paroxysmal atrial fibrillation: Secondary | ICD-10-CM | POA: Diagnosis present

## 2014-11-20 DIAGNOSIS — Z96642 Presence of left artificial hip joint: Secondary | ICD-10-CM | POA: Diagnosis present

## 2014-11-20 DIAGNOSIS — Z9842 Cataract extraction status, left eye: Secondary | ICD-10-CM

## 2014-11-20 DIAGNOSIS — Z880 Allergy status to penicillin: Secondary | ICD-10-CM

## 2014-11-20 DIAGNOSIS — Z9841 Cataract extraction status, right eye: Secondary | ICD-10-CM | POA: Diagnosis not present

## 2014-11-20 DIAGNOSIS — Z882 Allergy status to sulfonamides status: Secondary | ICD-10-CM | POA: Diagnosis not present

## 2014-11-20 DIAGNOSIS — M179 Osteoarthritis of knee, unspecified: Secondary | ICD-10-CM | POA: Diagnosis not present

## 2014-11-20 DIAGNOSIS — Z981 Arthrodesis status: Secondary | ICD-10-CM | POA: Diagnosis not present

## 2014-11-20 DIAGNOSIS — K589 Irritable bowel syndrome without diarrhea: Secondary | ICD-10-CM | POA: Diagnosis not present

## 2014-11-20 DIAGNOSIS — R05 Cough: Secondary | ICD-10-CM | POA: Diagnosis not present

## 2014-11-20 DIAGNOSIS — Z87891 Personal history of nicotine dependence: Secondary | ICD-10-CM

## 2014-11-20 DIAGNOSIS — R062 Wheezing: Secondary | ICD-10-CM

## 2014-11-20 DIAGNOSIS — Z886 Allergy status to analgesic agent status: Secondary | ICD-10-CM | POA: Diagnosis not present

## 2014-11-20 DIAGNOSIS — M797 Fibromyalgia: Secondary | ICD-10-CM | POA: Diagnosis present

## 2014-11-20 DIAGNOSIS — G629 Polyneuropathy, unspecified: Secondary | ICD-10-CM | POA: Diagnosis present

## 2014-11-20 DIAGNOSIS — M25561 Pain in right knee: Secondary | ICD-10-CM | POA: Diagnosis not present

## 2014-11-20 DIAGNOSIS — K219 Gastro-esophageal reflux disease without esophagitis: Secondary | ICD-10-CM | POA: Diagnosis present

## 2014-11-20 DIAGNOSIS — M1710 Unilateral primary osteoarthritis, unspecified knee: Secondary | ICD-10-CM | POA: Diagnosis present

## 2014-11-20 DIAGNOSIS — E780 Pure hypercholesterolemia: Secondary | ICD-10-CM | POA: Diagnosis present

## 2014-11-20 DIAGNOSIS — Z90710 Acquired absence of both cervix and uterus: Secondary | ICD-10-CM | POA: Diagnosis not present

## 2014-11-20 HISTORY — PX: TOTAL KNEE ARTHROPLASTY: SHX125

## 2014-11-20 LAB — PROTIME-INR
INR: 1.03 (ref 0.00–1.49)
Prothrombin Time: 13.6 seconds (ref 11.6–15.2)

## 2014-11-20 LAB — TYPE AND SCREEN
ABO/RH(D): O POS
Antibody Screen: NEGATIVE

## 2014-11-20 SURGERY — ARTHROPLASTY, KNEE, TOTAL
Anesthesia: General | Site: Knee | Laterality: Right

## 2014-11-20 MED ORDER — HYDROMORPHONE HCL 1 MG/ML IJ SOLN: 0.2500 mg | INTRAMUSCULAR | Status: DC | PRN

## 2014-11-20 MED ORDER — METOCLOPRAMIDE HCL 5 MG/ML IJ SOLN: 5.0000 mg | Freq: Three times a day (TID) | INTRAMUSCULAR | Status: DC | PRN

## 2014-11-20 MED ORDER — DOCUSATE SODIUM 100 MG PO CAPS
100.0000 mg | ORAL_CAPSULE | Freq: Two times a day (BID) | ORAL | Status: DC
  Administered 2014-11-20 – 2014-11-22 (×5): 100 mg via ORAL

## 2014-11-20 MED ORDER — BISOPROLOL FUMARATE 5 MG PO TABS
5.0000 mg | ORAL_TABLET | Freq: Two times a day (BID) | ORAL | Status: DC
  Administered 2014-11-20 – 2014-11-21 (×3): 5 mg via ORAL
  Filled 2014-11-20 (×5): qty 1

## 2014-11-20 MED ORDER — GABAPENTIN 600 MG PO TABS: 600.0000 mg | ORAL_TABLET | Freq: Three times a day (TID) | ORAL | Status: DC

## 2014-11-20 MED ORDER — DILTIAZEM HCL ER COATED BEADS 120 MG PO CP24
120.0000 mg | ORAL_CAPSULE | Freq: Every morning | ORAL | Status: DC
  Administered 2014-11-20 – 2014-11-21 (×2): 120 mg via ORAL
  Filled 2014-11-20 (×4): qty 1

## 2014-11-20 MED ORDER — PROPOFOL 10 MG/ML IV BOLUS
INTRAVENOUS | Status: AC
  Filled 2014-11-20: qty 20

## 2014-11-20 MED ORDER — FENTANYL CITRATE 0.05 MG/ML IJ SOLN
25.0000 ug | INTRAMUSCULAR | Status: DC | PRN
  Administered 2014-11-20 (×3): 50 ug via INTRAVENOUS

## 2014-11-20 MED ORDER — ROCURONIUM BROMIDE 100 MG/10ML IV SOLN
INTRAVENOUS | Status: AC
  Filled 2014-11-20: qty 1

## 2014-11-20 MED ORDER — GLYCOPYRROLATE 0.2 MG/ML IJ SOLN
INTRAMUSCULAR | Status: DC | PRN
  Administered 2014-11-20: 0.4 mg via INTRAVENOUS

## 2014-11-20 MED ORDER — FENTANYL CITRATE 0.05 MG/ML IJ SOLN
INTRAMUSCULAR | Status: AC
  Filled 2014-11-20: qty 2

## 2014-11-20 MED ORDER — OXYCODONE HCL 5 MG PO TABS
5.0000 mg | ORAL_TABLET | ORAL | Status: DC | PRN
  Administered 2014-11-20 (×2): 10 mg via ORAL
  Administered 2014-11-20: 5 mg via ORAL
  Administered 2014-11-21 – 2014-11-22 (×9): 10 mg via ORAL
  Filled 2014-11-20 (×5): qty 2
  Filled 2014-11-20: qty 1
  Filled 2014-11-20 (×6): qty 2

## 2014-11-20 MED ORDER — SODIUM CHLORIDE 0.9 % IR SOLN
Status: DC | PRN
  Administered 2014-11-20: 1000 mL

## 2014-11-20 MED ORDER — LACTATED RINGERS IV SOLN
INTRAVENOUS | Status: DC
  Administered 2014-11-20: 09:00:00 via INTRAVENOUS
  Administered 2014-11-20: 1000 mL via INTRAVENOUS

## 2014-11-20 MED ORDER — BISACODYL 10 MG RE SUPP: 10.0000 mg | Freq: Every day | RECTAL | Status: DC | PRN

## 2014-11-20 MED ORDER — ONDANSETRON HCL 4 MG/2ML IJ SOLN
INTRAMUSCULAR | Status: DC | PRN
  Administered 2014-11-20: 4 mg via INTRAVENOUS

## 2014-11-20 MED ORDER — SODIUM CHLORIDE 0.9 % IJ SOLN
INTRAMUSCULAR | Status: DC | PRN
  Administered 2014-11-20: 30 mL via INTRAVENOUS

## 2014-11-20 MED ORDER — ONDANSETRON HCL 4 MG/2ML IJ SOLN
INTRAMUSCULAR | Status: AC
  Filled 2014-11-20: qty 2

## 2014-11-20 MED ORDER — MORPHINE SULFATE 2 MG/ML IJ SOLN: 1.0000 mg | INTRAMUSCULAR | Status: DC | PRN

## 2014-11-20 MED ORDER — METHOCARBAMOL 1000 MG/10ML IJ SOLN
500.0000 mg | Freq: Four times a day (QID) | INTRAVENOUS | Status: DC | PRN
  Administered 2014-11-20: 500 mg via INTRAVENOUS
  Filled 2014-11-20 (×2): qty 5

## 2014-11-20 MED ORDER — FENTANYL CITRATE 0.05 MG/ML IJ SOLN
INTRAMUSCULAR | Status: DC | PRN
  Administered 2014-11-20: 100 ug via INTRAVENOUS
  Administered 2014-11-20 (×3): 50 ug via INTRAVENOUS

## 2014-11-20 MED ORDER — EZETIMIBE 10 MG PO TABS
10.0000 mg | ORAL_TABLET | Freq: Every day | ORAL | Status: DC
  Administered 2014-11-20 – 2014-11-21 (×2): 10 mg via ORAL
  Filled 2014-11-20 (×4): qty 1

## 2014-11-20 MED ORDER — SODIUM CHLORIDE 0.9 % IV SOLN
INTRAVENOUS | Status: DC
  Administered 2014-11-20 – 2014-11-21 (×2): via INTRAVENOUS

## 2014-11-20 MED ORDER — POLYETHYLENE GLYCOL 3350 17 G PO PACK: 17.0000 g | PACK | Freq: Every day | ORAL | Status: DC | PRN

## 2014-11-20 MED ORDER — DEXAMETHASONE SODIUM PHOSPHATE 10 MG/ML IJ SOLN
10.0000 mg | Freq: Once | INTRAMUSCULAR | Status: AC
  Administered 2014-11-21: 10 mg via INTRAVENOUS
  Filled 2014-11-20: qty 1

## 2014-11-20 MED ORDER — SIMVASTATIN 40 MG PO TABS
40.0000 mg | ORAL_TABLET | Freq: Every evening | ORAL | Status: DC
  Filled 2014-11-20: qty 1

## 2014-11-20 MED ORDER — BUPIVACAINE LIPOSOME 1.3 % IJ SUSP
20.0000 mL | Freq: Once | INTRAMUSCULAR | Status: DC
  Filled 2014-11-20: qty 20

## 2014-11-20 MED ORDER — FLEET ENEMA 7-19 GM/118ML RE ENEM: 1.0000 | ENEMA | Freq: Once | RECTAL | Status: AC | PRN

## 2014-11-20 MED ORDER — MENTHOL 3 MG MT LOZG
1.0000 | LOZENGE | OROMUCOSAL | Status: DC | PRN
  Filled 2014-11-20: qty 9

## 2014-11-20 MED ORDER — NORTRIPTYLINE HCL 25 MG PO CAPS
50.0000 mg | ORAL_CAPSULE | Freq: Every day | ORAL | Status: DC
  Administered 2014-11-20 – 2014-11-21 (×2): 50 mg via ORAL
  Filled 2014-11-20 (×3): qty 2

## 2014-11-20 MED ORDER — ROCURONIUM BROMIDE 100 MG/10ML IV SOLN
INTRAVENOUS | Status: DC | PRN
  Administered 2014-11-20: 30 mg via INTRAVENOUS

## 2014-11-20 MED ORDER — METHOCARBAMOL 500 MG PO TABS
500.0000 mg | ORAL_TABLET | Freq: Four times a day (QID) | ORAL | Status: DC | PRN
Start: 2014-11-20 — End: 2014-11-22
  Administered 2014-11-21 – 2014-11-22 (×3): 500 mg via ORAL
  Filled 2014-11-20 (×3): qty 1

## 2014-11-20 MED ORDER — ONDANSETRON HCL 4 MG/2ML IJ SOLN: 4.0000 mg | Freq: Four times a day (QID) | INTRAMUSCULAR | Status: DC | PRN

## 2014-11-20 MED ORDER — PROPOFOL 10 MG/ML IV BOLUS
INTRAVENOUS | Status: DC | PRN
  Administered 2014-11-20: 120 mg via INTRAVENOUS

## 2014-11-20 MED ORDER — DEXAMETHASONE SODIUM PHOSPHATE 10 MG/ML IJ SOLN
INTRAMUSCULAR | Status: AC
  Filled 2014-11-20: qty 1

## 2014-11-20 MED ORDER — CITALOPRAM HYDROBROMIDE 10 MG PO TABS
10.0000 mg | ORAL_TABLET | Freq: Every morning | ORAL | Status: DC
  Administered 2014-11-21 – 2014-11-22 (×2): 10 mg via ORAL
  Filled 2014-11-20 (×2): qty 1

## 2014-11-20 MED ORDER — VANCOMYCIN HCL IN DEXTROSE 1-5 GM/200ML-% IV SOLN
1000.0000 mg | Freq: Two times a day (BID) | INTRAVENOUS | Status: AC
  Administered 2014-11-20: 1000 mg via INTRAVENOUS
  Filled 2014-11-20: qty 200

## 2014-11-20 MED ORDER — SODIUM CHLORIDE 0.9 % IJ SOLN
INTRAMUSCULAR | Status: AC
  Filled 2014-11-20: qty 50

## 2014-11-20 MED ORDER — FENTANYL CITRATE 0.05 MG/ML IJ SOLN
INTRAMUSCULAR | Status: AC
  Filled 2014-11-20: qty 5

## 2014-11-20 MED ORDER — GLYCOPYRROLATE 0.2 MG/ML IJ SOLN
INTRAMUSCULAR | Status: AC
  Filled 2014-11-20: qty 1

## 2014-11-20 MED ORDER — BUPIVACAINE LIPOSOME 1.3 % IJ SUSP
INTRAMUSCULAR | Status: DC | PRN
  Administered 2014-11-20: 20 mL

## 2014-11-20 MED ORDER — WARFARIN - PHARMACIST DOSING INPATIENT: Freq: Every day | Status: DC

## 2014-11-20 MED ORDER — EPHEDRINE SULFATE 50 MG/ML IJ SOLN
INTRAMUSCULAR | Status: DC | PRN
  Administered 2014-11-20 (×6): 10 mg via INTRAVENOUS

## 2014-11-20 MED ORDER — METOCLOPRAMIDE HCL 10 MG PO TABS: 5.0000 mg | ORAL_TABLET | Freq: Three times a day (TID) | ORAL | Status: DC | PRN

## 2014-11-20 MED ORDER — VANCOMYCIN HCL IN DEXTROSE 1-5 GM/200ML-% IV SOLN
1000.0000 mg | INTRAVENOUS | Status: AC
  Administered 2014-11-20: 1000 mg via INTRAVENOUS

## 2014-11-20 MED ORDER — LIDOCAINE HCL (CARDIAC) 20 MG/ML IV SOLN
INTRAVENOUS | Status: AC
Start: 2014-11-20 — End: 2014-11-20
  Filled 2014-11-20: qty 5

## 2014-11-20 MED ORDER — ACETAMINOPHEN 10 MG/ML IV SOLN
1000.0000 mg | Freq: Once | INTRAVENOUS | Status: AC
  Administered 2014-11-20: 1000 mg via INTRAVENOUS
  Filled 2014-11-20: qty 100

## 2014-11-20 MED ORDER — NEOSTIGMINE METHYLSULFATE 10 MG/10ML IV SOLN
INTRAVENOUS | Status: DC | PRN
  Administered 2014-11-20: 3 mg via INTRAVENOUS

## 2014-11-20 MED ORDER — WARFARIN SODIUM 7.5 MG PO TABS
7.5000 mg | ORAL_TABLET | Freq: Once | ORAL | Status: AC
  Administered 2014-11-20: 7.5 mg via ORAL
  Filled 2014-11-20: qty 1

## 2014-11-20 MED ORDER — EPHEDRINE SULFATE 50 MG/ML IJ SOLN
INTRAMUSCULAR | Status: AC
  Filled 2014-11-20: qty 1

## 2014-11-20 MED ORDER — ATORVASTATIN CALCIUM 20 MG PO TABS
20.0000 mg | ORAL_TABLET | Freq: Every day | ORAL | Status: DC
  Administered 2014-11-20 – 2014-11-22 (×3): 20 mg via ORAL
  Filled 2014-11-20 (×4): qty 1

## 2014-11-20 MED ORDER — SUCCINYLCHOLINE CHLORIDE 20 MG/ML IJ SOLN
INTRAMUSCULAR | Status: DC | PRN
  Administered 2014-11-20: 100 mg via INTRAVENOUS

## 2014-11-20 MED ORDER — 0.9 % SODIUM CHLORIDE (POUR BTL) OPTIME
TOPICAL | Status: DC | PRN
  Administered 2014-11-20: 1000 mL

## 2014-11-20 MED ORDER — LIDOCAINE HCL (CARDIAC) 20 MG/ML IV SOLN
INTRAVENOUS | Status: DC | PRN
  Administered 2014-11-20: 100 mg via INTRAVENOUS

## 2014-11-20 MED ORDER — ACETAMINOPHEN 500 MG PO TABS
1000.0000 mg | ORAL_TABLET | Freq: Four times a day (QID) | ORAL | Status: AC
  Administered 2014-11-20 – 2014-11-21 (×3): 1000 mg via ORAL
  Filled 2014-11-20 (×3): qty 2

## 2014-11-20 MED ORDER — FAMOTIDINE 20 MG PO TABS
20.0000 mg | ORAL_TABLET | Freq: Every day | ORAL | Status: DC
  Administered 2014-11-20 – 2014-11-21 (×2): 20 mg via ORAL
  Filled 2014-11-20 (×3): qty 1

## 2014-11-20 MED ORDER — SODIUM CHLORIDE 0.9 % IV SOLN: INTRAVENOUS | Status: DC

## 2014-11-20 MED ORDER — SODIUM CHLORIDE 0.9 % IJ SOLN
INTRAMUSCULAR | Status: AC
  Filled 2014-11-20: qty 10

## 2014-11-20 MED ORDER — ONDANSETRON HCL 4 MG/2ML IJ SOLN: 4.0000 mg | Freq: Once | INTRAMUSCULAR | Status: DC | PRN

## 2014-11-20 MED ORDER — ONDANSETRON HCL 4 MG PO TABS: 4.0000 mg | ORAL_TABLET | Freq: Four times a day (QID) | ORAL | Status: DC | PRN

## 2014-11-20 MED ORDER — BUPIVACAINE HCL (PF) 0.25 % IJ SOLN
INTRAMUSCULAR | Status: AC
  Filled 2014-11-20: qty 30

## 2014-11-20 MED ORDER — TORSEMIDE 10 MG PO TABS
10.0000 mg | ORAL_TABLET | Freq: Every morning | ORAL | Status: DC
  Filled 2014-11-20 (×2): qty 1

## 2014-11-20 MED ORDER — DIPHENHYDRAMINE HCL 12.5 MG/5ML PO ELIX
12.5000 mg | ORAL_SOLUTION | ORAL | Status: DC | PRN
  Administered 2014-11-22: 12.5 mg via ORAL
  Filled 2014-11-20: qty 10

## 2014-11-20 MED ORDER — ACETAMINOPHEN 650 MG RE SUPP: 650.0000 mg | Freq: Four times a day (QID) | RECTAL | Status: DC | PRN

## 2014-11-20 MED ORDER — BUPIVACAINE HCL 0.25 % IJ SOLN
INTRAMUSCULAR | Status: DC | PRN
  Administered 2014-11-20: 20 mL

## 2014-11-20 MED ORDER — PHENYLEPHRINE HCL 10 MG/ML IJ SOLN
INTRAMUSCULAR | Status: DC | PRN
  Administered 2014-11-20: 80 ug via INTRAVENOUS

## 2014-11-20 MED ORDER — PANTOPRAZOLE SODIUM 40 MG PO TBEC
80.0000 mg | DELAYED_RELEASE_TABLET | Freq: Every day | ORAL | Status: DC
  Administered 2014-11-20: 80 mg via ORAL
  Filled 2014-11-20 (×2): qty 2

## 2014-11-20 MED ORDER — ACETAMINOPHEN 325 MG PO TABS: 650.0000 mg | ORAL_TABLET | Freq: Four times a day (QID) | ORAL | Status: DC | PRN

## 2014-11-20 MED ORDER — DEXAMETHASONE SODIUM PHOSPHATE 10 MG/ML IJ SOLN
10.0000 mg | Freq: Once | INTRAMUSCULAR | Status: AC
  Administered 2014-11-20: 10 mg via INTRAVENOUS

## 2014-11-20 MED ORDER — SODIUM CHLORIDE 0.9 % IV SOLN
1000.0000 mg | INTRAVENOUS | Status: AC
  Administered 2014-11-20: 1000 mg via INTRAVENOUS
  Filled 2014-11-20: qty 10

## 2014-11-20 MED ORDER — MIDAZOLAM HCL 2 MG/2ML IJ SOLN
INTRAMUSCULAR | Status: AC
  Filled 2014-11-20: qty 2

## 2014-11-20 MED ORDER — ESMOLOL HCL 10 MG/ML IV SOLN
INTRAVENOUS | Status: AC
  Filled 2014-11-20: qty 10

## 2014-11-20 MED ORDER — PHENYLEPHRINE 40 MCG/ML (10ML) SYRINGE FOR IV PUSH (FOR BLOOD PRESSURE SUPPORT)
PREFILLED_SYRINGE | INTRAVENOUS | Status: AC
  Filled 2014-11-20: qty 10

## 2014-11-20 MED ORDER — GLYCOPYRROLATE 0.2 MG/ML IJ SOLN
INTRAMUSCULAR | Status: AC
  Filled 2014-11-20: qty 2

## 2014-11-20 MED ORDER — ESMOLOL HCL 10 MG/ML IV SOLN
INTRAVENOUS | Status: DC | PRN
  Administered 2014-11-20: 20 mg via INTRAVENOUS

## 2014-11-20 MED ORDER — GABAPENTIN 300 MG PO CAPS
600.0000 mg | ORAL_CAPSULE | Freq: Three times a day (TID) | ORAL | Status: DC
  Administered 2014-11-20 – 2014-11-22 (×8): 600 mg via ORAL
  Filled 2014-11-20 (×9): qty 2

## 2014-11-20 MED ORDER — PHENOL 1.4 % MT LIQD
1.0000 | OROMUCOSAL | Status: DC | PRN
  Filled 2014-11-20: qty 177

## 2014-11-20 MED ORDER — VANCOMYCIN HCL IN DEXTROSE 1-5 GM/200ML-% IV SOLN
INTRAVENOUS | Status: AC
  Filled 2014-11-20: qty 200

## 2014-11-20 SURGICAL SUPPLY — 60 items
BAG DECANTER FOR FLEXI CONT (MISCELLANEOUS) ×2 IMPLANT
BAG ZIPLOCK 12X15 (MISCELLANEOUS) ×2 IMPLANT
BANDAGE ELASTIC 6 VELCRO ST LF (GAUZE/BANDAGES/DRESSINGS) ×2 IMPLANT
BANDAGE ESMARK 6X9 LF (GAUZE/BANDAGES/DRESSINGS) ×1 IMPLANT
BLADE SAG 18X100X1.27 (BLADE) ×2 IMPLANT
BLADE SAW SGTL 11.0X1.19X90.0M (BLADE) ×2 IMPLANT
BNDG ESMARK 6X9 LF (GAUZE/BANDAGES/DRESSINGS) ×2
BOWL SMART MIX CTS (DISPOSABLE) ×2 IMPLANT
CAP KNEE TOTAL 3 SIGMA ×2 IMPLANT
CEMENT HV SMART SET (Cement) ×4 IMPLANT
CUFF TOURN SGL QUICK 34 (TOURNIQUET CUFF) ×1
CUFF TRNQT CYL 34X4X40X1 (TOURNIQUET CUFF) ×1 IMPLANT
DECANTER SPIKE VIAL GLASS SM (MISCELLANEOUS) ×2 IMPLANT
DRAPE EXTREMITY T 121X128X90 (DRAPE) ×2 IMPLANT
DRAPE POUCH INSTRU U-SHP 10X18 (DRAPES) ×2 IMPLANT
DRAPE U-SHAPE 47X51 STRL (DRAPES) ×2 IMPLANT
DRSG ADAPTIC 3X8 NADH LF (GAUZE/BANDAGES/DRESSINGS) ×2 IMPLANT
DRSG PAD ABDOMINAL 8X10 ST (GAUZE/BANDAGES/DRESSINGS) ×2 IMPLANT
DURAPREP 26ML APPLICATOR (WOUND CARE) ×2 IMPLANT
ELECT REM PT RETURN 9FT ADLT (ELECTROSURGICAL) ×2
ELECTRODE REM PT RTRN 9FT ADLT (ELECTROSURGICAL) ×1 IMPLANT
EVACUATOR 1/8 PVC DRAIN (DRAIN) ×2 IMPLANT
FACESHIELD WRAPAROUND (MASK) ×10 IMPLANT
GAUZE SPONGE 4X4 12PLY STRL (GAUZE/BANDAGES/DRESSINGS) ×2 IMPLANT
GLOVE BIO SURGEON STRL SZ7.5 (GLOVE) IMPLANT
GLOVE BIO SURGEON STRL SZ8 (GLOVE) ×2 IMPLANT
GLOVE BIOGEL PI IND STRL 6.5 (GLOVE) IMPLANT
GLOVE BIOGEL PI IND STRL 8 (GLOVE) ×1 IMPLANT
GLOVE BIOGEL PI INDICATOR 6.5 (GLOVE)
GLOVE BIOGEL PI INDICATOR 8 (GLOVE) ×1
GLOVE SURG SS PI 6.5 STRL IVOR (GLOVE) IMPLANT
GOWN STRL REUS W/TWL LRG LVL3 (GOWN DISPOSABLE) ×2 IMPLANT
GOWN STRL REUS W/TWL XL LVL3 (GOWN DISPOSABLE) IMPLANT
HANDPIECE INTERPULSE COAX TIP (DISPOSABLE) ×1
IMMOBILIZER KNEE 20 (SOFTGOODS) ×2 IMPLANT
KIT BASIN OR (CUSTOM PROCEDURE TRAY) ×2 IMPLANT
MANIFOLD NEPTUNE II (INSTRUMENTS) ×2 IMPLANT
NDL SAFETY ECLIPSE 18X1.5 (NEEDLE) ×2 IMPLANT
NEEDLE HYPO 18GX1.5 SHARP (NEEDLE) ×2
NS IRRIG 1000ML POUR BTL (IV SOLUTION) ×2 IMPLANT
PACK TOTAL JOINT (CUSTOM PROCEDURE TRAY) ×2 IMPLANT
PAD ABD 8X10 STRL (GAUZE/BANDAGES/DRESSINGS) ×2 IMPLANT
PADDING CAST COTTON 6X4 STRL (CAST SUPPLIES) ×2 IMPLANT
PEN SKIN MARKING BROAD (MISCELLANEOUS) ×2 IMPLANT
POSITIONER SURGICAL ARM (MISCELLANEOUS) ×2 IMPLANT
SET HNDPC FAN SPRY TIP SCT (DISPOSABLE) ×1 IMPLANT
STRIP CLOSURE SKIN 1/2X4 (GAUZE/BANDAGES/DRESSINGS) ×2 IMPLANT
SUCTION FRAZIER 12FR DISP (SUCTIONS) ×2 IMPLANT
SUT MNCRL AB 4-0 PS2 18 (SUTURE) ×2 IMPLANT
SUT VIC AB 2-0 CT1 27 (SUTURE) ×3
SUT VIC AB 2-0 CT1 TAPERPNT 27 (SUTURE) ×3 IMPLANT
SUT VLOC 180 0 24IN GS25 (SUTURE) ×2 IMPLANT
SYR 20CC LL (SYRINGE) ×2 IMPLANT
SYR 50ML LL SCALE MARK (SYRINGE) ×2 IMPLANT
TOWEL OR 17X26 10 PK STRL BLUE (TOWEL DISPOSABLE) ×2 IMPLANT
TOWEL OR NON WOVEN STRL DISP B (DISPOSABLE) IMPLANT
TRAY FOLEY CATH 14FRSI W/METER (CATHETERS) ×2 IMPLANT
WATER STERILE IRR 1500ML POUR (IV SOLUTION) ×2 IMPLANT
WRAP KNEE MAXI GEL POST OP (GAUZE/BANDAGES/DRESSINGS) ×2 IMPLANT
YANKAUER SUCT BULB TIP 10FT TU (MISCELLANEOUS) ×2 IMPLANT

## 2014-11-20 NOTE — Anesthesia Procedure Notes (Signed)
Procedure Name: Intubation Date/Time: 11/20/2014 8:22 AM Performed by: Maxwell Caul Pre-anesthesia Checklist: Patient identified, Emergency Drugs available, Suction available and Patient being monitored Patient Re-evaluated:Patient Re-evaluated prior to inductionOxygen Delivery Method: Circle System Utilized Preoxygenation: Pre-oxygenation with 100% oxygen Intubation Type: IV induction Ventilation: Mask ventilation without difficulty Laryngoscope Size: Mac and 4 Grade View: Grade I Tube type: Oral Tube size: 7.5 mm Number of attempts: 1 Airway Equipment and Method: Stylet and Oral airway Placement Confirmation: ETT inserted through vocal cords under direct vision,  positive ETCO2 and breath sounds checked- equal and bilateral Secured at: 21 cm Tube secured with: Tape Dental Injury: Teeth and Oropharynx as per pre-operative assessment

## 2014-11-20 NOTE — Progress Notes (Signed)
ANTICOAGULATION CONSULT NOTE - Initial Consult  Pharmacy Consult for warfarin Indication: atrial fibrillation, DVT ppx s/p R TKA  Allergies  Allergen Reactions  . Cefuroxime Axetil Anaphylaxis    unknown  . Macrodantin Anaphylaxis  . Alendronate Sodium Other (See Comments)    Severe chest pain similar to heart attack   . Codeine Nausea And Vomiting  . Demerol [Meperidine] Other (See Comments)    hallucinate  . Meperidine Hcl Nausea And Vomiting  . Other Other (See Comments)    Hismanal and Maprobamate  portobello mushrooms - diarrhea  . Sulfonamide Derivatives Hives, Itching and Swelling    Patient Measurements: Height: 5\' 3"  (160 cm) Weight: 149 lb (67.586 kg) IBW/kg (Calculated) : 52.4  Vital Signs: Temp: 97.4 F (36.3 C) (02/29 1215) Temp Source: Oral (02/29 1215) BP: 104/52 mmHg (02/29 1215) Pulse Rate: 54 (02/29 1215)  Labs:  Recent Labs  11/20/14 0705  LABPROT 13.6  INR 1.03    Estimated Creatinine Clearance: 53.5 mL/min (by C-G formula based on Cr of 0.77).   Medical History: Past Medical History  Diagnosis Date  . Hyperlipidemia     takes Zetia and Simvastatin nightly  . Palpitation   . Carotid bruit     Right  . Edema   . Arthritis   . Allergy     portabella mushrooms/diarrhea  . Heart murmur   . Osteoporosis   . Fibromyalgia   . Thrush 03/23/2012  . Vaginitis 03/23/2012  . Sigmoid colon ulcer 03/23/2012  . Hyperglycemia 04/27/2012  . History of migraine     last about 43yrs ago  . Foot drop, right   . Joint pain   . Joint swelling   . Chronic back pain   . Skin problem in pregnancy     takes gabapentin 3 times a day and also has an ointment  . GERD (gastroesophageal reflux disease)     takes Zantac and Nexium daily  . Hemorrhoids   . Diverticulosis   . IBS (irritable bowel syndrome)     takes align daily  . Hypocalcemia 03/29/2013  . Depression with anxiety 03/29/2013    hx of  . Tachycardia 07/03/2013  . Onychomycosis 07/03/2013  .  Adenomatous polyp of colon 1991, 2008  . Medicare annual wellness visit, subsequent 08/13/2014  . Dysrhythmia     paroxysmal A fib, irregular heartbeat;takes Bisoprolol nightly  . Migraine 04/27/2012    last 40 years ago  . Falls frequently     Medications:  Scheduled:  . acetaminophen  1,000 mg Oral 4 times per day  . bisoprolol  5 mg Oral BID  . [START ON 11/21/2014] citalopram  10 mg Oral q morning - 10a  . [START ON 11/21/2014] dexamethasone  10 mg Intravenous Once  . diltiazem  120 mg Oral q morning - 10a  . docusate sodium  100 mg Oral BID  . ezetimibe  10 mg Oral QHS  . famotidine  20 mg Oral QHS  . gabapentin  600 mg Oral TID  . nortriptyline  50 mg Oral QHS  . pantoprazole  80 mg Oral QHS  . simvastatin  40 mg Oral QPM  . [START ON 11/21/2014] torsemide  10 mg Oral q morning - 10a  . vancomycin  1,000 mg Intravenous Q12H   Infusions:  . sodium chloride 75 mL/hr at 11/20/14 1112    Assessment: 81 yo s/p R TKA 2/29. To start warfarin per pharmacy dosing post op. Patient was taking warfarin PTA for hx  Aflutter at a home dose of 5mg  nightly except 7.5mg  on Tues/Fri. Baseline labs good  Goal of Therapy:  INR 2-3   Plan:  1) Warfarin 7.5mg  tonight 2) Daily INR   Adrian Saran, PharmD, BCPS Pager [phone] 11/20/2014 12:30 PM

## 2014-11-20 NOTE — Anesthesia Postprocedure Evaluation (Signed)
  Anesthesia Post-op Note  Patient: Chelsea Hancock  Procedure(s) Performed: Procedure(s) (LRB): RIGHT TOTAL KNEE ARTHROPLASTY (Right)  Patient Location: PACU  Anesthesia Type: General  Level of Consciousness: awake and alert   Airway and Oxygen Therapy: Patient Spontanous Breathing  Post-op Pain: mild  Post-op Assessment: Post-op Vital signs reviewed, Patient's Cardiovascular Status Stable, Respiratory Function Stable, Patent Airway and No signs of Nausea or vomiting  Last Vitals:  Filed Vitals:   11/20/14 1045  BP: 133/56  Pulse: 58  Temp:   Resp: 13    Post-op Vital Signs: stable   Complications: No apparent anesthesia complications

## 2014-11-20 NOTE — H&P (View-Only) (Signed)
[name] DOB: 1934-02-02 Divorced / Language: [name] / Race: White Female Date of Admission: 11/20/2014 CC: Right knee pain History of Present Illness The patient is a 81 year old female who comes in for a preoperative History and Physical. The patient is scheduled for a right to be performed by Dr. Dione Plover. Aluisio, MD at [facility] on 11-20-2014. The patient is a 80 year old female who presents today for follow up of their knee. The patient is being followed for their right knee pain and osteoarthritis. They are now year(s) out from when symptoms began. Symptoms reported today include: pain and popping. The patient feels that they are doing poorly and report their pain level to be moderate to severe. Current treatment includes: NSAIDs (Ibuprofen (only on Wednesday when she works)). The following medication has been used for pain control: antiinflammatory medication (ibuprofen). Note for "Follow-up Knee": Last Synvisc series was done in March 2014, helped alittle bit. Unfortunately, the injections have not helped. She is at a stage now where she is ready to go ahead and get the knee fixed. This is limiting what she can and can not do. She also has a foot drop on that side and feels like that contributes to her problem. The left knee occasionally bothers her but no where near as bad as the right. She is ready to proceed with the knee replacement. They have been treated conservatively in the past for the above stated problem and despite conservative measures, they continue to have progressive pain and severe functional limitations and dysfunction. They have failed non-operative management including home exercise, medications, and injections. It is felt that they would benefit from undergoing total joint replacement. Risks and benefits of the procedure have been discussed with the patient and they elect to proceed with surgery. There are no active contraindications to surgery such  as ongoing infection or rapidly progressive neurological disease.   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) Ingrowing nail (703.0)02/07/1998 Tear, medial meniscus, knee, current (836.0)07/24/1999 Adenomatous polyp of colon (D12.6) Fibromyalgia Diverticulitis Of Colon Heart murmur Peripheral Neuropathy Osteoarthritis of right knee (M17.9) Hypercholesterolemia Gastroesophageal Reflux Disease Irritable bowel syndrome Osteoarthritis Hypothyroidism Atrial Fibrillation Atrial Flutter Palpitations Osteoporosis Vaginitis Thrush Sigmoid Colon Ulcer Migraine Headache History of Right Foot Drop Hemorrhoids Diverticulosis Cataract Bialteral Depression Anxiety Disorder Onychomycosis  Allergies Meprobamate *ANTIANXIETY AGENTS* Ceftin *CEPHALOSPORINS* Penicillin VK *PENICILLINS* local injection site pain - no rash or swelling Macrodantin *URINARY ANTI-INFECTIVES* Hismanal *ANTIHISTAMINES* Demerol *ANALGESICS - OPIOID* Nausea, Vomiting. Codeine Derivatives Nausea, Vomiting. Sulfanilamide *CHEMICALS* Hives, Swelling, Itching. Tongue Swelling  Family History Osteoarthritis grandmother mothers side and grandmother fathers side Cancer mother, father, grandfather mothers side and grandmother fathers side Heart Disease grandmother mothers side, grandfather mothers side and grandfather fathers side Hypertension father Cerebrovascular Accident father  Social History Tobacco use Former smoker. former smoker; smoke(d) less than 1/2 pack(s) per day Number of flights of stairs before winded 2-3 Children 2 Marital status divorced Illicit drug use no Living situation live alone Tobacco / smoke exposure yes outdoors only Pain Contract no Drug/Alcohol Rehab (Previously)  no Previously in rehab no Exercise Exercises daily; does other Current work status retired Alcohol use current drinker; drinks beer and wine; 8-14 per week Drug/Alcohol Rehab (Currently) no  Medication History  Gabapentin (Oral) Specific dose unknown - Active. Calcium-Vitamin D (Oral) Specific dose unknown - Active. Torsemide (10MG  Tablet, Oral) Active.  NexIUM (40MG  Capsule DR, Oral) Active. Nortriptyline HCl (50MG  Capsule, Oral) Active. Simvastatin (40MG  Tablet, Oral) Active. Zetia (10MG  Tablet, Oral) Active. Lunesta (3MG  Tablet, Oral) Active. HydrOXYzine HCl (10MG  Tablet, Oral) Active. Digestive Advantage Active. Cartia Active. (120mg ) Ranitidine HCl (300MG  Tablet, Oral) Active. Biotin (5MG  Tablet, Oral) Active. Megared Active. Citalopram Hydrobromide (10MG  Tablet, Oral) Active. Bisoprolol Fumarate (5MG  Tablet, Oral) Active. Reclast (5MG /100ML Solution, Intravenous annually) Active. Clobetasol Propionate (0.05% Cream, External) Active. Ibuprofen (400MG  Tablet, Oral) Active. Centrum Silver (Oral) Active. Baby Aspirin (81MG  Tablet Chewable, Oral) Active. Coumadin (5MG  Tablet, Oral) Active.  Past Surgical History Colon Polyp Removal - Colonoscopy Spinal Surgery 1970's Hysterectomy Date: 44. partial (non-cancerous) Neck Disc Surgery 2003, 2005, 01/05/12 Leg Circulation Surgery bilateral; 1970's - Vein Stripping Spinal Fusion lower back; 1997 Carpal Tunnel Repair Date: 06/03/2011. left Dilation and Curettage of Uterus Spinal Decompression neck and lower back, 08/03/12 Total Hip Replacement Date: 02/04/2011. left Tonsillectomy 1943 Left Wrist Surgery - Cyst Excision Left Nerve Elbow Surgery Date: 06/03/2011. Cataract Extraction-Bilateral Left 02/14/2014, Right 05/02/2014 Carpal Tunnel Surgery - Right Date: 07/2013.  Review of Systems General ROS: negative Respiratory ROS: no SOB or cough Cardiovascular ROS: no CP or  orthopnea Gastrointestinal ROS: no nausea, vomiting, constipation, diarhhea Musculoskeletal ROS: positive for knee pain Neurological ROS: negative  Vitals  Weight: 149.5 lb Height: 63in Weight was reported by patient. Body Surface Area: 1.71 m Body Mass Index: 26.48 kg/m  BP: 128/66 (Sitting, Left Arm, Standard)   Physical Exam General Mental Status -Alert, cooperative and good historian. General Appearance-pleasant, Not in acute distress. Orientation-Oriented X3. Build & Nutrition-Well nourished and Well developed.  Head and Neck Head-normocephalic, atraumatic . Neck Global Assessment - supple, no bruit auscultated on the right, no bruit auscultated on the left.  Eye Vision-Wears corrective lenses. Pupil - Bilateral-Regular and Round. Motion - Bilateral-EOMI.  Chest and Lung Exam Auscultation Breath sounds - clear at anterior chest wall and clear at posterior chest wall. Adventitious sounds - No Adventitious sounds.  Cardiovascular Auscultation Rhythm - Regular rate and rhythm. Heart Sounds - S1 WNL and S2 WNL. Murmurs & Other Heart Sounds - Auscultation of the heart reveals - No Murmurs.  Abdomen Palpation/Percussion Tenderness - Abdomen is non-tender to palpation. Rigidity (guarding) - Abdomen is soft. Auscultation Auscultation of the abdomen reveals - Bowel sounds normal.  Female Genitourinary Note: Not done, not pertinent to present illness   Musculoskeletal Note: She is alert and oriented. No apparent distress. Evaluation of her right knee shows no swelling. The range of motion is 5-130. There is marked crepitus on range of motion. She is tender medial greater than lateral with no instability noted. The left knee range is 0-130. Slight crepitus with range of motion. No tenderness or instability. Right foot shows significant weakness with dorsiflexion. She can not fully actively dorsiflex.  RADIOGRAPHS: AP both knees and lateral  show she has a significant arthritic change in both knees. She is bone on bone lateral and patellofemoral on the right. She has some bone on bone changes on the left also.   Assessment & Plan Primary osteoarthritis of right knee (M17.11) Note:Surgical Plans: Right Total Knee Repalcement  Disposition: Home  PCP: Dr. Charlett Blake - Patient has been seen preoperatively and felt to be stable for surgery. Cards: Dr. [name] - Clearance note is pending however EPIC note stated the patient was "Okay to hold warfarin 5 days before procedure. She does not need bridging. she is remaining in normal sinus rhythm."  IV TXA  Anesthesia Issues:  None  Please note that the patient is currently taking Coumadin for heart issues and has been instructed to stop the Coumadin 5 days prior to surgery. She will be placed back onto her Coumadin postop along with a Lovenox bridge postoperatively until the Coumadin is therapeutic.  Signed electronically by Joelene Millin, III PA-C

## 2014-11-20 NOTE — Op Note (Signed)
Pre-operative diagnosis- Osteoarthritis  Right knee(s)  Post-operative diagnosis- Osteoarthritis Right knee(s)  Procedure-  Right  Total Knee Arthroplasty  Surgeon- Dione Plover. Baron Parmelee, MD  Assistant- Arlee Muslim, PA-C   Anesthesia-  General  EBL-* No blood loss amount entered *   Drains Hemovac  Tourniquet time-  Total Tourniquet Time Documented: Thigh (Right) - 27 minutes Total: Thigh (Right) - 27 minutes     Complications- None  Condition-PACU - hemodynamically stable.   Brief Clinical Note  Chelsea Hancock is a 81 y.o. year old female with end stage OA of her right knee with progressively worsening pain and dysfunction. She has constant pain, with activity and at rest and significant functional deficits with difficulties even with ADLs. She has had extensive non-op management including analgesics, injections of cortisone and viscosupplements, and home exercise program, but remains in significant pain with significant dysfunction.Radiographs show bone on bone arthritis lateral and patelloefmoral. She presents now for right Total Knee Arthroplasty.    Procedure in detail---   The patient is brought into the operating room and positioned supine on the operating table. After successful administration of  General,   a tourniquet is placed high on the  Right thigh(s) and the lower extremity is prepped and draped in the usual sterile fashion. Time out is performed by the operating team and then the  Right lower extremity is wrapped in Esmarch, knee flexed and the tourniquet inflated to 300 mmHg.       A midline incision is made with a ten blade through the subcutaneous tissue to the level of the extensor mechanism. A fresh blade is used to make a medial parapatellar arthrotomy. Soft tissue over the proximal medial tibia is subperiosteally elevated to the joint line with a knife and into the semimembranosus bursa with a Cobb elevator. Soft tissue over the proximal lateral tibia is  elevated with attention being paid to avoiding the patellar tendon on the tibial tubercle. The patella is everted, knee flexed 90 degrees and the ACL and PCL are removed. Findings are bone on bone lateral and patellofemoral .       The drill is used to create a starting hole in the distal femur and the canal is thoroughly irrigated with sterile saline to remove the fatty contents. The 5 degree Right  valgus alignment guide is placed into the femoral canal and the distal femoral cutting block is pinned to remove 10 mm off the distal femur. Resection is made with an oscillating saw.      The tibia is subluxed forward and the menisci are removed. The extramedullary alignment guide is placed referencing proximally at the medial aspect of the tibial tubercle and distally along the second metatarsal axis and tibial crest. The block is pinned to remove 92mm off the more deficient lateral  side. Resection is made with an oscillating saw. Size 3is the most appropriate size for the tibia and the proximal tibia is prepared with the modular drill and keel punch for that size.      The femoral sizing guide is placed and size 4 is most appropriate. Rotation is marked off the epicondylar axis and confirmed by creating a rectangular flexion gap at 90 degrees. The size 4 cutting block is pinned in this rotation and the anterior, posterior and chamfer cuts are made with the oscillating saw. The intercondylar block is then placed and that cut is made.      Trial size 3 tibial component, trial size 4 narrow posterior  stabilized femur and a 10  mm posterior stabilized rotating platform insert trial is placed. Full extension is achieved with excellent varus/valgus and anterior/posterior balance throughout full range of motion. The patella is everted and thickness measured to be 22  mm. Free hand resection is taken to 12 mm, a 38 template is placed, lug holes are drilled, trial patella is placed, and it tracks normally. Osteophytes are  removed off the posterior femur with the trial in place. All trials are removed and the cut bone surfaces prepared with pulsatile lavage. Cement is mixed and once ready for implantation, the size 3 tibial implant, size  4 narrow posterior stabilized femoral component, and the size 38 patella are cemented in place and the patella is held with the clamp. The trial insert is placed and the knee held in full extension. The Exparel (20 ml mixed with 30 ml saline) and .25% Bupivicaine, are injected into the extensor mechanism, posterior capsule, medial and lateral gutters and subcutaneous tissues.  All extruded cement is removed and once the cement is hard the permanent 10 mm posterior stabilized rotating platform insert is placed into the tibial tray.      The wound is copiously irrigated with saline solution and the extensor mechanism closed over a hemovac drain with #1 V-loc suture. The tourniquet is released for a total tourniquet time of 26  minutes. Flexion against gravity is 140 degrees and the patella tracks normally. Subcutaneous tissue is closed with 2.0 vicryl and subcuticular with running 4.0 Monocryl. The incision is cleaned and dried and steri-strips and a bulky sterile dressing are applied. The limb is placed into a knee immobilizer and the patient is awakened and transported to recovery in stable condition.      Please note that a surgical assistant was a medical necessity for this procedure in order to perform it in a safe and expeditious manner. Surgical assistant was necessary to retract the ligaments and vital neurovascular structures to prevent injury to them and also necessary for proper positioning of the limb to allow for anatomic placement of the prosthesis.   Dione Plover Arlyce Circle, MD    11/20/2014, 9:16 AM

## 2014-11-20 NOTE — Progress Notes (Signed)
Utilization review completed.  

## 2014-11-20 NOTE — Interval H&P Note (Signed)
History and Physical Interval Note:  11/20/2014 7:08 AM  [name]  has presented today for surgery, with the diagnosis of right knee osteoarthritis  The various methods of treatment have been discussed with the patient and family. After consideration of risks, benefits and other options for treatment, the patient has consented to  Procedure(s): RIGHT TOTAL KNEE ARTHROPLASTY (Right) as a surgical intervention .  The patient's history has been reviewed, patient examined, no change in status, stable for surgery.  I have reviewed the patient's chart and labs.  Questions were answered to the patient's satisfaction.     Chelsea Hancock

## 2014-11-20 NOTE — Evaluation (Signed)
Physical Therapy Evaluation Patient Details Name: Chelsea Hancock MRN: [id number] DOB: 04-20-1934 Today's Date: 11/20/2014   History of Present Illness  81 y.o. female with h/o R foot drop, multiple back surgeries, L THA, fibromyalgia, a fib admitted for R TKA.   Clinical Impression  Pt is s/p TKA resulting in the deficits listed below (see PT Problem List). * Pt will benefit from skilled PT to increase their independence and safety with mobility to allow discharge to the venue listed below.   Pt ambulated 20' with RW and min/guard assist. Assisted her with TKA exercises. Good progress expected. Plans to DC to daughter's home in [place].  **    Follow Up Recommendations Home health PT    Equipment Recommendations  None recommended by PT    Recommendations for Other Services       Precautions / Restrictions Precautions Precautions: Knee;Fall Precaution Comments: h/o falls Restrictions Weight Bearing Restrictions: No      Mobility  Bed Mobility Overal bed mobility: Needs Assistance Bed Mobility: Supine to Sit     Supine to sit: Mod assist     General bed mobility comments: assist to raise trunk and support RLE  Transfers Overall transfer level: Needs assistance Equipment used: Rolling walker (2 wheeled) Transfers: Sit to/from Stand Sit to Stand: Min assist         General transfer comment: assist to rise, cues for hand placement  Ambulation/Gait Ambulation/Gait assistance: Min guard Ambulation Distance (Feet): 20 Feet Assistive device: Rolling walker (2 wheeled) Gait Pattern/deviations: Step-to pattern;Decreased step length - right;Antalgic     General Gait Details: cues for sequencing, distance limited by pain  Stairs            Wheelchair Mobility    Modified Rankin (Stroke Patients Only)       Balance                                             Pertinent Vitals/Pain Pain Assessment: 0-10 Pain Score: 6  Pain  Location: R knee Pain Descriptors / Indicators: Sore Pain Intervention(s): Monitored during session;Limited activity within patient's tolerance;Ice applied;Premedicated before session    Home Living Family/patient expects to be discharged to:: Private residence Living Arrangements: Children Available Help at Discharge: Family;Available 24 hours/day Type of Home: House Home Access: Stairs to enter Entrance Stairs-Rails: Right;Left Entrance Stairs-Number of Steps: 6   Home Equipment: Walker - 2 wheels;Cane - single point      Prior Function Level of Independence: Independent with assistive device(s)         Comments: uses cane going out, multiple falls     Hand Dominance        Extremity/Trunk Assessment   Upper Extremity Assessment: Overall WFL for tasks assessed           Lower Extremity Assessment: RLE deficits/detail RLE Deficits / Details: 0-40* R knee AAROM, SLR 3/5, ankle has chronic foot drop with decreased sensation at baseline    Cervical / Trunk Assessment: Normal  Communication      Cognition Arousal/Alertness: Awake/alert Behavior During Therapy: WFL for tasks assessed/performed Overall Cognitive Status: Within Functional Limits for tasks assessed                      General Comments      Exercises Total Joint Exercises Ankle Circles/Pumps: AROM;Both;10 reps;Supine Quad Sets: AROM;Right;5  reps;Supine Heel Slides: AAROM;Right;10 reps;Supine Hip ABduction/ADduction: AAROM;Right;10 reps;Supine Straight Leg Raises: AAROM;AROM;Right;5 reps;Supine      Assessment/Plan    PT Assessment Patient needs continued PT services  PT Diagnosis Difficulty walking;Abnormality of gait;Acute pain   PT Problem List Decreased strength;Decreased range of motion;Decreased activity tolerance;Pain;Decreased knowledge of use of DME;Decreased mobility  PT Treatment Interventions DME instruction;Gait training;Stair training;Therapeutic  activities;Patient/family education;Therapeutic exercise   PT Goals (Current goals can be found in the Care Plan section) Acute Rehab PT Goals Patient Stated Goal: gardening, yard work PT Goal Formulation: With patient/family Time For Goal Achievement: 11/27/14 Potential to Achieve Goals: Good    Frequency 7X/week   Barriers to discharge        Co-evaluation               End of Session Equipment Utilized During Treatment: Gait belt Activity Tolerance: Patient tolerated treatment well Patient left: in chair;with call bell/phone within reach;with family/visitor present Nurse Communication: Mobility status         Time: 5830-9407 PT Time Calculation (min) (ACUTE ONLY): 32 min   Charges:   PT Evaluation $Initial PT Evaluation Tier I: 1 Procedure PT Treatments $Gait Training: 8-22 mins   PT G Codes:        Philomena Doheny 11/20/2014, 4:14 PM [phone]

## 2014-11-20 NOTE — Progress Notes (Signed)
Patient did not take beta blocker morning of surgery. Not given in Short Stay due to pulse <60.

## 2014-11-20 NOTE — Transfer of Care (Signed)
Immediate Anesthesia Transfer of Care Note  Patient: Chelsea Hancock  Procedure(s) Performed: Procedure(s) (LRB): RIGHT TOTAL KNEE ARTHROPLASTY (Right)  Patient Location: PACU  Anesthesia Type: General  Level of Consciousness: sedated, patient cooperative and responds to stimulation  Airway & Oxygen Therapy: Patient Spontanous Breathing and Patient connected to face mask oxgen  Post-op Assessment: Report given to PACU RN and Post -op Vital signs reviewed and stable  Post vital signs: Reviewed and stable  Complications: No apparent anesthesia complications

## 2014-11-21 LAB — PROTIME-INR
INR: 1.13 (ref 0.00–1.49)
Prothrombin Time: 14.6 seconds (ref 11.6–15.2)

## 2014-11-21 LAB — CBC
HCT: 30.9 % — ABNORMAL LOW (ref 36.0–46.0)
Hemoglobin: 10.2 g/dL — ABNORMAL LOW (ref 12.0–15.0)
MCH: 30.6 pg (ref 26.0–34.0)
MCHC: 33 g/dL (ref 30.0–36.0)
MCV: 92.8 fL (ref 78.0–100.0)
Platelets: 200 10*3/uL (ref 150–400)
RBC: 3.33 MIL/uL — ABNORMAL LOW (ref 3.87–5.11)
RDW: 13.7 % (ref 11.5–15.5)
WBC: 10.7 10*3/uL — ABNORMAL HIGH (ref 4.0–10.5)

## 2014-11-21 LAB — BASIC METABOLIC PANEL
Anion gap: 7 (ref 5–15)
BUN: 11 mg/dL (ref 6–23)
CO2: 23 mmol/L (ref 19–32)
Calcium: 8.1 mg/dL — ABNORMAL LOW (ref 8.4–10.5)
Chloride: 106 mmol/L (ref 96–112)
Creatinine, Ser: 0.64 mg/dL (ref 0.50–1.10)
GFR calc Af Amer: >90 mL/min (ref >90)
GFR calc non Af Amer: 83 mL/min — ABNORMAL LOW (ref >90)
Glucose, Bld: 165 mg/dL — ABNORMAL HIGH (ref 70–99)
Potassium: 4.3 mmol/L (ref 3.5–5.1)
Sodium: 136 mmol/L (ref 135–145)

## 2014-11-21 MED ORDER — SODIUM CHLORIDE 0.9 % IV BOLUS (SEPSIS)
500.0000 mL | Freq: Once | INTRAVENOUS | Status: AC
  Administered 2014-11-21: 500 mL via INTRAVENOUS

## 2014-11-21 MED ORDER — NON FORMULARY: 40.0000 mg | Freq: Every day | Status: DC

## 2014-11-21 MED ORDER — ESOMEPRAZOLE MAGNESIUM 40 MG PO CPDR
40.0000 mg | DELAYED_RELEASE_CAPSULE | Freq: Every day | ORAL | Status: DC
  Administered 2014-11-21: 40 mg via ORAL
  Filled 2014-11-21 (×2): qty 1

## 2014-11-21 MED ORDER — WARFARIN SODIUM 5 MG PO TABS
5.0000 mg | ORAL_TABLET | Freq: Once | ORAL | Status: AC
  Administered 2014-11-21: 5 mg via ORAL
  Filled 2014-11-21: qty 1

## 2014-11-21 NOTE — Progress Notes (Signed)
Physical Therapy Treatment Patient Details Name: SAMIKSHA PELLICANO MRN: [id number] DOB: 11/19/1933 Today's Date: 11/21/2014    History of Present Illness 81 y.o. female with h/o R foot drop, multiple back surgeries, L THA, fibromyalgia, a fib admitted for R TKA.     PT Comments    POD # 1 pm session.  Assisted with amb a great distance in hallway with no c/o dizziness just fatigue.  Follow Up Recommendations  Home health PT     Equipment Recommendations  None recommended by PT    Recommendations for Other Services       Precautions / Restrictions Precautions Precautions: Knee;Fall Precaution Comments: Instructed pt on KI use for amb Required Braces or Orthoses: Knee Immobilizer - Right Restrictions Weight Bearing Restrictions: No    Mobility  Bed Mobility               General bed mobility comments: pt OOB in recliner  Transfers Overall transfer level: Needs assistance Equipment used: Rolling walker (2 wheeled) Transfers: Sit to/from Stand Sit to Stand: Min guard;Min assist         General transfer comment: assist to rise and steady; cues for UE/LE placement  Ambulation/Gait Ambulation/Gait assistance: Min guard;Supervision Ambulation Distance (Feet): 45 Feet Assistive device: Rolling walker (2 wheeled) Gait Pattern/deviations: Step-to pattern;Decreased stance time - right;Trunk flexed Gait velocity: decreased   General Gait Details: cues for sequencing and proper walker to self distance   Stairs            Wheelchair Mobility    Modified Rankin (Stroke Patients Only)       Balance                                    Cognition Arousal/Alertness: Awake/alert Behavior During Therapy: WFL for tasks assessed/performed Overall Cognitive Status: Within Functional Limits for tasks assessed                      Exercises      General Comments        Pertinent Vitals/Pain Pain Assessment: 0-10 Pain Score:  5  Pain Location: R knee Pain Descriptors / Indicators: Aching Pain Intervention(s): Monitored during session;Premedicated before session;Repositioned;Ice applied    Home Living                      Prior Function            PT Goals (current goals can now be found in the care plan section) Progress towards PT goals: Progressing toward goals    Frequency  7X/week    PT Plan      Co-evaluation             End of Session Equipment Utilized During Treatment: Gait belt;Right knee immobilizer Activity Tolerance: Patient tolerated treatment well Patient left: in chair;with call bell/phone within reach;with family/visitor present     Time: 5035-4656 PT Time Calculation (min) (ACUTE ONLY): 18 min  Charges:  $Gait Training: 8-22 mins                    G Codes:      Rica Koyanagi  PTA [facility]  Acute  Rehab Pager      [phone]

## 2014-11-21 NOTE — Progress Notes (Signed)
Subjective: 1 Day Post-Op Procedure(s) (LRB): RIGHT TOTAL KNEE ARTHROPLASTY (Right) Patient reports pain as mild.   Patient seen in rounds with Dr. Wynelle Link. Walked 20 feet the day of surgery. Patient is well, but has had some minor complaints of pain in the knee, requiring pain medications We will resume therapy today.  Plan is to go Home after hospital stay.  Objective: Vital signs in last 24 hours: Temp:  [97.3 F (36.3 C)-98.2 F (36.8 C)] 97.6 F (36.4 C) (03/01 0625) Pulse Rate:  [50-60] 55 (03/01 0625) Resp:  [10-20] 18 (03/01 0740) BP: (82-149)/(44-87) 98/52 mmHg (03/01 0627) SpO2:  [93 %-100 %] 93 % (03/01 0740)  Intake/Output from previous day:  Intake/Output Summary (Last 24 hours) at 11/21/14 0911 Last data filed at 11/21/14 0904  Gross per 24 hour  Intake 3588.75 ml  Output   3605 ml  Net -16.25 ml    Intake/Output this shift: Total I/O In: 240 [P.O.:240] Out: -   Labs:  Recent Labs  11/21/14 0410  HGB 10.2*    Recent Labs  11/21/14 0410  WBC 10.7*  RBC 3.33*  HCT 30.9*  PLT 200    Recent Labs  11/21/14 0410  NA 136  K 4.3  CL 106  CO2 23  BUN 11  CREATININE 0.64  GLUCOSE 165*  CALCIUM 8.1*    Recent Labs  11/20/14 0705 11/21/14 0410  INR 1.03 1.13    EXAM General - Patient is Alert, Appropriate and Oriented Extremity - Neurovascular intact Sensation intact distally Dorsiflexion/Plantar flexion intact Dressing - dressing C/D/I Motor Function - intact, moving foot and toes well on exam.  Hemovac pulled without difficulty.  Past Medical History  Diagnosis Date  . Hyperlipidemia     takes Zetia and Simvastatin nightly  . Palpitation   . Carotid bruit     Right  . Edema   . Arthritis   . Allergy     portabella mushrooms/diarrhea  . Heart murmur   . Osteoporosis   . Fibromyalgia   . Thrush 03/23/2012  . Vaginitis 03/23/2012  . Sigmoid colon ulcer 03/23/2012  . Hyperglycemia 04/27/2012  . History of migraine     last  about 21yrs ago  . Foot drop, right   . Joint pain   . Joint swelling   . Chronic back pain   . Skin problem in pregnancy     takes gabapentin 3 times a day and also has an ointment  . GERD (gastroesophageal reflux disease)     takes Zantac and Nexium daily  . Hemorrhoids   . Diverticulosis   . IBS (irritable bowel syndrome)     takes align daily  . Hypocalcemia 03/29/2013  . Depression with anxiety 03/29/2013    hx of  . Tachycardia 07/03/2013  . Onychomycosis 07/03/2013  . Adenomatous polyp of colon 1991, 2008  . Medicare annual wellness visit, subsequent 08/13/2014  . Dysrhythmia     paroxysmal A fib, irregular heartbeat;takes Bisoprolol nightly  . Migraine 04/27/2012    last 40 years ago  . Falls frequently     Assessment/Plan: 1 Day Post-Op Procedure(s) (LRB): RIGHT TOTAL KNEE ARTHROPLASTY (Right) Principal Problem:   OA (osteoarthritis) of knee  Estimated body mass index is 26.4 kg/(m^2) as calculated from the following:   Height as of this encounter: 5\' 3"  (1.6 m).   Weight as of this encounter: 67.586 kg (149 lb). Advance diet Up with therapy Plan for discharge tomorrow Discharge home with home health  DVT Prophylaxis - Coumadin Weight-Bearing as tolerated to right leg D/C O2 and Pulse OX and try on Room Air  Arlee Muslim, PA-C Orthopaedic Surgery 11/21/2014, 9:11 AM

## 2014-11-21 NOTE — Progress Notes (Signed)
ANTICOAGULATION CONSULT NOTE - Follow up  Pharmacy Consult for warfarin Indication: atrial fibrillation, DVT ppx s/p R TKA  Allergies  Allergen Reactions  . Cefuroxime Axetil Anaphylaxis    unknown  . Macrodantin Anaphylaxis  . Alendronate Sodium Other (See Comments)    Severe chest pain similar to heart attack   . Codeine Nausea And Vomiting  . Demerol [Meperidine] Other (See Comments)    hallucinate  . Meperidine Hcl Nausea And Vomiting  . Other Other (See Comments)    Hismanal and Maprobamate  portobello mushrooms - diarrhea  . Sulfonamide Derivatives Hives, Itching and Swelling    Patient Measurements: Height: 5\' 3"  (160 cm) Weight: 149 lb (67.586 kg) IBW/kg (Calculated) : 52.4  Vital Signs: Temp: 97.6 F (36.4 C) (03/01 0625) Temp Source: Oral (03/01 0625) BP: 98/52 mmHg (03/01 0627) Pulse Rate: 55 (03/01 0625)  Labs:  Recent Labs  11/20/14 0705 11/21/14 0410  HGB  --  10.2*  HCT  --  30.9*  PLT  --  200  LABPROT 13.6 14.6  INR 1.03 1.13  CREATININE  --  0.64    Estimated Creatinine Clearance: 53.5 mL/min (by C-G formula based on Cr of 0.64).  Assessment: 81 yo s/p R TKA 2/29. To start warfarin per pharmacy dosing post op. Patient was taking warfarin PTA for hx Aflutter at a home dose of 5mg  nightly except 7.5mg  on Tues/Fri. Baseline labs good.  Today, 11/21/2014: INR up slightly after 7.5mg  yesterday. H/H low as expected post-op. Pltc wnl. No bleeding reported/documented. Reg diet, tolerating. Currently in NSR.  Goal of Therapy:  INR 2-3   Plan:  1) Warfarin 5mg  tonight as per home regimen. 2) Daily INR.  Romeo Rabon, PharmD, pager [phone]. 11/21/2014,9:25 AM.

## 2014-11-21 NOTE — Progress Notes (Signed)
Physical Therapy Treatment Patient Details Name: Chelsea Hancock MRN: [id number] DOB: 05-22-1934 Today's Date: 11/21/2014    History of Present Illness 81 y.o. female with h/o R foot drop, multiple back surgeries, L THA, fibromyalgia, a fib admitted for R TKA.     PT Comments    POD # 1 am session.  Pt OOB in recliner receiving IV bolus.  Performed TKR TE's then applied ICE.  Will attempt amb after completion of fluids.   Follow Up Recommendations  Home health PT     Equipment Recommendations       Recommendations for Other Services       Precautions / Restrictions Precautions Precautions: Knee;Fall Precaution Comments: Instructed pt on KI use for amb Required Braces or Orthoses: Knee Immobilizer - Right Restrictions Weight Bearing Restrictions: No    Mobility         Ambulation/Gait                 Stairs            Wheelchair Mobility    Modified Rankin (Stroke Patients Only)       Balance                                    Cognition Arousal/Alertness: Awake/alert Behavior During Therapy: WFL for tasks assessed/performed Overall Cognitive Status: Within Functional Limits for tasks assessed                      Exercises   Total Knee Replacement TE's 10 reps B LE ankle pumps 10 reps towel squeezes 10 reps knee presses 10 reps heel slides  10 reps SAQ's 10 reps SLR's 10 reps ABD Followed by ICE     General Comments        Pertinent Vitals/Pain Pain Assessment: 0-10 Pain Score: 6  Pain Location: R knee Pain Descriptors / Indicators: Aching Pain Intervention(s): Limited activity within patient's tolerance;Monitored during session;Repositioned;Premedicated before session;Ice applied    Home Living Family/patient expects to be discharged to:: Private residence Living Arrangements: Children Available Help at Discharge: Family;Available 24 hours/day         Home Equipment: Toilet riser;Tub bench  (with rails)      Prior Function Level of Independence: Independent with assistive device(s)          PT Goals (current goals can now be found in the care plan section) Acute Rehab PT Goals Patient Stated Goal: gardening, yard work Progress towards PT goals: Progressing toward goals    Frequency  7X/week    PT Plan      Co-evaluation             End of Session Equipment Utilized During Treatment: Gait belt Activity Tolerance: Patient tolerated treatment well Patient left: in chair;with call bell/phone within reach;with family/visitor present     Time: 1100-1125 PT Time Calculation (min) (ACUTE ONLY): 25 min  Charges:  $Therapeutic Exercise: 23-37 mins                    G Codes:      Rica Koyanagi  PTA [facility]  Acute  Rehab Pager      [phone]

## 2014-11-21 NOTE — Evaluation (Signed)
Occupational Therapy Evaluation Patient Details Name: Chelsea Hancock MRN: [id number] DOB: [birth year] Today's Date: 11/21/2014    History of Present Illness 81 y.o. female with h/o R foot drop, multiple back surgeries, L THA, fibromyalgia, a fib admitted for R TKA.    Clinical Impression   Pt was admitted for R TKA. She was mod I prior to admission and plans to return to daughter's house after acute stay.  Pt needs up to mod A for LB ADLs at this time.  Only performed SPT due to decreased BP and pt being symptomatic.  Will follow in acute to further educate on bathroom transfers. Daughter will assist with ADLs    Follow Up Recommendations  Supervision/Assistance - 24 hour    Equipment Recommendations  None recommended by OT    Recommendations for Other Services       Precautions / Restrictions Precautions Precautions: Knee;Fall Precaution Comments: h/o falls Required Braces or Orthoses: Knee Immobilizer - Right Restrictions Weight Bearing Restrictions: No      Mobility Bed Mobility   Bed Mobility: Supine to Sit     Supine to sit: Min assist     General bed mobility comments: assist for RLE and trunk  Transfers   Equipment used: Rolling walker (2 wheeled) Transfers: Sit to/from [facility] Sit to Stand: Min assist Stand pivot transfers: Min assist       General transfer comment: assist to rise and steady; cues for UE/LE placement    Balance                                            ADL Overall ADL's : Needs assistance/impaired             Lower Body Bathing: Moderate assistance;Sit to/from stand       Lower Body Dressing: Maximal assistance;Sit to/from stand   Toilet Transfer: Minimal assistance;Stand-pivot (bed to chair)             General ADL Comments: pt can perform UB adls with set up.  Daughter will help her as needed.  Pt dizzy when she first stood up:  see vital signs around 920 this am in  vitals section of chart.  Did not walk into bathroom this session.  Pt needs reinforcement for sit to stand.       Vision     Perception     Praxis      Pertinent Vitals/Pain Pain Assessment: 0-10 Pain Score: 6  Pain Location: R knee Pain Descriptors / Indicators: Aching Pain Intervention(s): Limited activity within patient's tolerance;Monitored during session;Repositioned;Premedicated before session;Ice applied     Hand Dominance     Extremity/Trunk Assessment Upper Extremity Assessment Upper Extremity Assessment: Generalized weakness (bil hand weakness--h/o neck sxs)           Communication Communication Communication: No difficulties   Cognition Arousal/Alertness: Awake/alert Behavior During Therapy: WFL for tasks assessed/performed Overall Cognitive Status: Within Functional Limits for tasks assessed                     General Comments       Exercises       Shoulder Instructions      Home Living Family/patient expects to be discharged to:: Private residence Living Arrangements: Children Available Help at Discharge: Family;Available 24 hours/day  Bathroom Shower/Tub: Occupational psychologist: Standard     Home Equipment: Toilet riser;Tub bench (with rails)          Prior Functioning/Environment Level of Independence: Independent with assistive device(s)             OT Diagnosis: Generalized weakness   OT Problem List: Decreased strength;Decreased activity tolerance;Impaired balance (sitting and/or standing);Pain;Cardiopulmonary status limiting activity   OT Treatment/Interventions: Self-care/ADL training;DME and/or AE instruction;Patient/family education;Balance training    OT Goals(Current goals can be found in the care plan section) Acute Rehab OT Goals Patient Stated Goal: gardening, yard work OT Goal Formulation: With patient Time For Goal Achievement: 11/28/14 Potential to Achieve Goals: Good ADL  Goals Pt Will Transfer to Toilet: with min guard assist;ambulating;bedside commode Pt Will Perform Tub/Shower Transfer: Shower transfer;with min guard assist;ambulating;tub bench Additional ADL Goal #1: pt will self-cue for sit to stand, UE/LE placement and perform with min guard  OT Frequency: Min 2X/week   Barriers to D/C:            Co-evaluation              End of Session CPM Right Knee CPM Right Knee: Off Nurse Communication:  (BP)  Activity Tolerance: Patient tolerated treatment well Patient left: in chair;with call bell/phone within reach;with family/visitor present   Time: 0917-0952 OT Time Calculation (min): 35 min Charges:  OT General Charges $OT Visit: 1 Procedure OT Evaluation $Initial OT Evaluation Tier I: 1 Procedure OT Treatments $Self Care/Home Management : 8-22 mins G-Codes:    Jeramy Dimmick Dec 12, 2014, 10:07 AM  Lesle Chris, OTR/L [phone] Dec 12, 2014

## 2014-11-21 NOTE — Care Management Note (Signed)
    Page 1 of 1   11/21/2014     11:38:55 AM CARE MANAGEMENT NOTE 11/21/2014  Patient:  Chelsea Hancock, Chelsea Hancock   Account Number:  [id number]  Date Initiated:  11/21/2014  Documentation initiated by:  [facility]  Subjective/Objective Assessment:   adm: RIGHT TOTAL KNEE ARTHROPLASTY (Right)     Action/Plan:   discharge planning   Anticipated DC Date:  11/21/2014   Anticipated DC Plan:  [place]  CM consult      [facility] Choice  HOME HEALTH   Choice offered to / List presented to:  C-1 Patient        [place] arranged  HH-2 PT      [place]   Status of service:  Completed, signed off Medicare Important Message given?   (If response is "NO", the following Medicare IM given date fields will be blank) Date Medicare IM given:   Medicare IM given by:   Date Additional Medicare IM given:   Additional Medicare IM given by:    Discharge Disposition:  [place]  Per UR Regulation:    If discussed at Long Length of Stay Meetings, dates discussed:    Comments:  11/21/14 11:00 CM met with pt in room to offer choice of home health agency.  Pt presurgically scheduled to have [facility] for HHPT/RN (INR) draw.  Pt will be recuperating at her daughter's address and can be reached at [place], [place]. Referral emailed to [employer], Tim.  No DME needed.  No other CM needs were communicated.  Mariane Masters, BSN, [place].

## 2014-11-22 ENCOUNTER — Inpatient Hospital Stay (HOSPITAL_COMMUNITY): Payer: Medicare Other

## 2014-11-22 LAB — BASIC METABOLIC PANEL
Anion gap: 7 (ref 5–15)
BUN: 17 mg/dL (ref 6–23)
CO2: 22 mmol/L (ref 19–32)
Calcium: 8.7 mg/dL (ref 8.4–10.5)
Chloride: 107 mmol/L (ref 96–112)
Creatinine, Ser: 0.7 mg/dL (ref 0.50–1.10)
GFR calc Af Amer: >90 mL/min (ref >90)
GFR calc non Af Amer: 81 mL/min — ABNORMAL LOW (ref >90)
Glucose, Bld: 147 mg/dL — ABNORMAL HIGH (ref 70–99)
Potassium: 4.6 mmol/L (ref 3.5–5.1)
Sodium: 136 mmol/L (ref 135–145)

## 2014-11-22 LAB — CBC
HCT: 32.2 % — ABNORMAL LOW (ref 36.0–46.0)
Hemoglobin: 10.6 g/dL — ABNORMAL LOW (ref 12.0–15.0)
MCH: 30.5 pg (ref 26.0–34.0)
MCHC: 32.9 g/dL (ref 30.0–36.0)
MCV: 92.5 fL (ref 78.0–100.0)
Platelets: 187 10*3/uL (ref 150–400)
RBC: 3.48 MIL/uL — ABNORMAL LOW (ref 3.87–5.11)
RDW: 14.2 % (ref 11.5–15.5)
WBC: 13.5 10*3/uL — ABNORMAL HIGH (ref 4.0–10.5)

## 2014-11-22 LAB — PROTIME-INR
INR: 1.25 (ref 0.00–1.49)
Prothrombin Time: 15.8 seconds — ABNORMAL HIGH (ref 11.6–15.2)

## 2014-11-22 MED ORDER — ENOXAPARIN SODIUM 40 MG/0.4ML ~~LOC~~ SOLN
40.0000 mg | SUBCUTANEOUS | Status: DC
  Administered 2014-11-22: 40 mg via SUBCUTANEOUS
  Filled 2014-11-22: qty 0.4

## 2014-11-22 MED ORDER — ENOXAPARIN SODIUM 40 MG/0.4ML ~~LOC~~ SOLN: 40.0000 mg | SUBCUTANEOUS | Status: DC

## 2014-11-22 MED ORDER — WARFARIN SODIUM 5 MG PO TABS: 5.0000 mg | ORAL_TABLET | Freq: Every day | ORAL | Status: DC

## 2014-11-22 MED ORDER — OXYCODONE HCL 5 MG PO TABS: 5.0000 mg | ORAL_TABLET | ORAL | Status: DC | PRN

## 2014-11-22 MED ORDER — LEVALBUTEROL HCL 0.63 MG/3ML IN NEBU: 0.6300 mg | INHALATION_SOLUTION | Freq: Four times a day (QID) | RESPIRATORY_TRACT | Status: DC | PRN

## 2014-11-22 MED ORDER — WARFARIN SODIUM 5 MG PO TABS: 5.0000 mg | ORAL_TABLET | Freq: Once | ORAL | Status: DC

## 2014-11-22 MED ORDER — WARFARIN SODIUM 5 MG PO TABS
5.0000 mg | ORAL_TABLET | Freq: Once | ORAL | Status: AC
  Administered 2014-11-22: 5 mg via ORAL
  Filled 2014-11-22: qty 1

## 2014-11-22 MED ORDER — METHOCARBAMOL 500 MG PO TABS: 500.0000 mg | ORAL_TABLET | Freq: Four times a day (QID) | ORAL | Status: DC | PRN

## 2014-11-22 NOTE — Progress Notes (Addendum)
Subjective: 2 Days Post-Op Procedure(s) (LRB): RIGHT TOTAL KNEE ARTHROPLASTY (Right) Patient reports pain as mild.  Pressures are better today. Patient seen in rounds for Dr. Wynelle Link.  Family in room. She did better yesterday afternoon with therapy but only one session.  Will get two sessions today and then home as long as she does well and meets goals. Patient is well, but has had some minor complaints of pain in the knee, requiring pain medications Patient is ready to go home later today after two sessions of therapy.  Objective: Vital signs in last 24 hours: Temp:  [98.1 F (36.7 C)-98.7 F (37.1 C)] 98.7 F (37.1 C) (03/02 0537) Pulse Rate:  [47-61] 50 (03/02 0537) Resp:  [16-18] 18 (03/02 0537) BP: (103-130)/(45-83) 128/53 mmHg (03/02 0537) SpO2:  [90 %-94 %] 90 % (03/02 0537)  Intake/Output from previous day:  Intake/Output Summary (Last 24 hours) at 11/22/14 0845 Last data filed at 11/22/14 0827  Gross per 24 hour  Intake 2542.75 ml  Output   1850 ml  Net 692.75 ml    Intake/Output this shift: Total I/O In: 240 [P.O.:240] Out: -   Labs:  Recent Labs  11/21/14 0410 11/22/14 0528  HGB 10.2* 10.6*    Recent Labs  11/21/14 0410 11/22/14 0528  WBC 10.7* 13.5*  RBC 3.33* 3.48*  HCT 30.9* 32.2*  PLT 200 187    Recent Labs  11/21/14 0410 11/22/14 0528  NA 136 136  K 4.3 4.6  CL 106 107  CO2 23 22  BUN 11 17  CREATININE 0.64 0.70  GLUCOSE 165* 147*  CALCIUM 8.1* 8.7    Recent Labs  11/21/14 0410 11/22/14 0528  INR 1.13 1.25    EXAM: General - Patient is Alert, Appropriate and Oriented Extremity - Neurovascular intact Sensation intact distally Dorsiflexion/Plantar flexion intact but weak dorsiflexion (PREEXISTING FOOT DROP - NOT NEW) Incision - clean, dry, no drainage Motor Function - intact, moving foot and toes well on exam except for the above weakness.  Assessment/Plan: 2 Days Post-Op Procedure(s) (LRB): RIGHT TOTAL KNEE  ARTHROPLASTY (Right) Procedure(s) (LRB): RIGHT TOTAL KNEE ARTHROPLASTY (Right) Past Medical History  Diagnosis Date  . Hyperlipidemia     takes Zetia and Simvastatin nightly  . Palpitation   . Carotid bruit     Right  . Edema   . Arthritis   . Allergy     portabella mushrooms/diarrhea  . Heart murmur   . Osteoporosis   . Fibromyalgia   . Thrush 03/23/2012  . Vaginitis 03/23/2012  . Sigmoid colon ulcer 03/23/2012  . Hyperglycemia 04/27/2012  . History of migraine     last about 39yrs ago  . Foot drop, right   . Joint pain   . Joint swelling   . Chronic back pain   . Skin problem in pregnancy     takes gabapentin 3 times a day and also has an ointment  . GERD (gastroesophageal reflux disease)     takes Zantac and Nexium daily  . Hemorrhoids   . Diverticulosis   . IBS (irritable bowel syndrome)     takes align daily  . Hypocalcemia 03/29/2013  . Depression with anxiety 03/29/2013    hx of  . Tachycardia 07/03/2013  . Onychomycosis 07/03/2013  . Adenomatous polyp of colon 1991, 2008  . Medicare annual wellness visit, subsequent 08/13/2014  . Dysrhythmia     paroxysmal A fib, irregular heartbeat;takes Bisoprolol nightly  . Migraine 04/27/2012    last 40 years ago  .  Falls frequently    Principal Problem:   OA (osteoarthritis) of knee  Estimated body mass index is 26.4 kg/(m^2) as calculated from the following:   Height as of this encounter: 5\' 3"  (1.6 m).   Weight as of this encounter: 67.586 kg (149 lb). Advance diet Up with therapy Discharge home with home health with [facility] Diet - Cardiac diet Follow up - in 2 weeks on Tuesday 3/15 with Dr. Wynelle Link Activity - WBAT Disposition - Home with family Condition Upon Discharge - improving D/C Meds - See DC Summary DVT Prophylaxis - Lovenox and Coumadin  Arlee Muslim, PA-C Orthopaedic Surgery 11/22/2014, 8:45 AM   Addendum - Called back into the room to discuss several issues.  She told the case manager that she had had  some left chest wall discomfort but denies at this time.  She also is hearing some expiratory wheezing on forced exhale.  She is in no distress but will check CXR, EKG due the the arrhythmia history, pulse ox and vitals.

## 2014-11-22 NOTE — Progress Notes (Signed)
   11/22/14 1500  PT Visit Information  Last PT Received On 11/22/14  Assistance Needed +1  History of Present Illness 81 y.o. female with h/o R foot drop, multiple back surgeries, L THA, fibromyalgia, a fib admitted for R TKA.   PT Time Calculation  PT Start Time (ACUTE ONLY) 1424  PT Stop Time (ACUTE ONLY) 1452  PT Time Calculation (min) (ACUTE ONLY) 28 min  Subjective Data  Patient Stated Goal gardening, yard work  Precautions  Precautions Knee;Fall  Precaution Comments Instructed pt on KI use for amb  Required Braces or Orthoses Knee Immobilizer - Right  Knee Immobilizer - Right Discontinue once straight leg raise with < 10 degree lag  Restrictions  Weight Bearing Restrictions No  Pain Assessment  Pain Assessment 0-10  Pain Score 2  Pain Location R knee  Pain Descriptors / Indicators Aching  Pain Intervention(s) Limited activity within patient's tolerance;Monitored during session;Ice applied;Repositioned  Cognition  Arousal/Alertness Awake/alert  Behavior During Therapy WFL for tasks assessed/performed  Overall Cognitive Status Within Functional Limits for tasks assessed  Bed Mobility  Overal bed mobility Needs Assistance  Bed Mobility Supine to Sit;Sit to Supine  Sit to supine Min assist  General bed mobility comments assist for R LE, cues for technique  Transfers  Overall transfer level Needs assistance  Equipment used Rolling walker (2 wheeled)  Transfers Sit to/from Stand  Sit to Stand Min guard  General transfer comment close guard for safety  Ambulation/Gait  Ambulation/Gait assistance Min guard;Supervision  Ambulation Distance (Feet) 90 Feet  Assistive device Rolling walker (2 wheeled)  Gait Pattern/deviations Step-to pattern;Step-through pattern;Antalgic  Gait velocity decreased  General Gait Details cues for sequencing and proper walker to self distance  Total Joint Exercises  Ankle Circles/Pumps AROM;Both;10 reps;Supine  Quad Sets AROM;Right;5 reps;Supine   Heel Slides AAROM;Right;10 reps;Supine  Hip ABduction/ADduction AAROM;Right;10 reps;Supine  Straight Leg Raises AAROM;AROM;Right;5 reps;Supine  Short Arc Quad AAROM;10 reps;Supine;Right  PT - End of Session  Equipment Utilized During Treatment Gait belt;Right knee immobilizer  Activity Tolerance Patient tolerated treatment well  Patient left in bed;with call bell/phone within reach;with family/visitor present  Nurse Communication Mobility status  PT - Assessment/Plan  PT Plan Current plan remains appropriate  PT Frequency (ACUTE ONLY) 7X/week  Follow Up Recommendations Home health PT  PT equipment None recommended by PT  PT Goal Progression  Progress towards PT goals Progressing toward goals  Acute Rehab PT Goals  Time For Goal Achievement 11/27/14  Potential to Achieve Goals Good  PT General Charges  $$ ACUTE PT VISIT 1 Procedure  PT Treatments  $Gait Training 8-22 mins  $Therapeutic Exercise 8-22 mins

## 2014-11-22 NOTE — Progress Notes (Signed)
Physical Therapy Treatment Patient Details Name: Chelsea Hancock MRN: [id number] DOB: 12-14-1933 Today's Date: 11/22/2014    History of Present Illness 81 y.o. female with h/o R foot drop, multiple back surgeries, L THA, fibromyalgia, a fib admitted for R TKA.     PT Comments    Pt progressing will see again in pm  Follow Up Recommendations  Home health PT     Equipment Recommendations  None recommended by PT    Recommendations for Other Services       Precautions / Restrictions Precautions Precautions: Knee;Fall Precaution Comments: Instructed pt on KI use for amb Required Braces or Orthoses: Knee Immobilizer - Right Knee Immobilizer - Right: Discontinue once straight leg raise with < 10 degree lag Restrictions Weight Bearing Restrictions: No    Mobility  Bed Mobility Overal bed mobility: Needs Assistance Bed Mobility: Supine to Sit;Sit to Supine     Supine to sit: Min assist Sit to supine: Min assist   General bed mobility comments: assist for R LE, cues for technique  Transfers Overall transfer level: Needs assistance Equipment used: Rolling walker (2 wheeled) Transfers: Sit to/from Stand Sit to Stand: Min guard         General transfer comment: close guard for safety  Ambulation/Gait Ambulation/Gait assistance: Min guard Ambulation Distance (Feet): 70 Feet Assistive device: Rolling walker (2 wheeled) Gait Pattern/deviations: Step-to pattern;Trunk flexed;Antalgic Gait velocity: decreased   General Gait Details: cues for sequencing and proper walker to self distance   Stairs Stairs: Yes Stairs assistance: Min assist Stair Management: One rail Right;One rail Left;Sideways;Step to pattern Number of Stairs: 5 General stair comments: cues for hand placement, sequence  Wheelchair Mobility    Modified Rankin (Stroke Patients Only)       Balance                                    Cognition Arousal/Alertness:  Awake/alert Behavior During Therapy: WFL for tasks assessed/performed Overall Cognitive Status: Within Functional Limits for tasks assessed                      Exercises      General Comments        Pertinent Vitals/Pain Pain Assessment: 0-10 Pain Score: 3  Pain Location: R knee Pain Descriptors / Indicators: Aching Pain Intervention(s): Limited activity within patient's tolerance;Monitored during session;Ice applied    Home Living                      Prior Function            PT Goals (current goals can now be found in the care plan section) Acute Rehab PT Goals Patient Stated Goal: gardening, yard work PT Goal Formulation: With patient/family Time For Goal Achievement: 11/27/14 Potential to Achieve Goals: Good Progress towards PT goals: Progressing toward goals    Frequency  7X/week    PT Plan Current plan remains appropriate    Co-evaluation             End of Session Equipment Utilized During Treatment: Gait belt;Right knee immobilizer Activity Tolerance: Patient tolerated treatment well Patient left: in bed;with call bell/phone within reach;with family/visitor present     Time: 4627-0350 PT Time Calculation (min) (ACUTE ONLY): 30 min  Charges:  $Gait Training: 8-22 mins $Therapeutic Activity: 8-22 mins  G CodesKenyon Hancock 11/22/2014, 1:38 PM

## 2014-11-22 NOTE — Progress Notes (Signed)
Occupational Therapy Treatment Patient Details Name: Chelsea Hancock MRN: [id number] DOB: September 13, 1934 Today's Date: 11/22/2014    History of present illness 81 y.o. female with h/o R foot drop, multiple back surgeries, L THA, fibromyalgia, a fib admitted for R TKA.    OT comments  Pt tolerated session well.  Practiced bathroom transfers. Pt and daughter verbalize understanding with these  Follow Up Recommendations  Supervision/Assistance - 24 hour    Equipment Recommendations  Other (comment)    Recommendations for Other Services      Precautions / Restrictions Precautions Precautions: Knee;Fall Precaution Comments: Instructed pt on KI use for amb Required Braces or Orthoses: Knee Immobilizer - Right Restrictions Weight Bearing Restrictions: No       Mobility Bed Mobility         Supine to sit: Min assist Sit to supine: Min assist   General bed mobility comments: assist for R LE.  Needed just a little assistance for OOB  Transfers   Equipment used: Rolling walker (2 wheeled) Transfers: Sit to/from Stand Sit to Stand: Min guard         General transfer comment: close guard for safety    Balance                                   ADL                           Toilet Transfer: Min guard;Ambulation;BSC;RW   Toileting- Clothing Manipulation and Hygiene: Set up;Sitting/lateral lean   Tub/ Shower Transfer: Walk-in shower;Min guard;Ambulation;3 in 1     General ADL Comments: performed bathroom transfers with min guard.  Gave daughter handout for shower transfer.  Pt needed very minimal assistance for getting OOB this morning.   Pt was able to self cue for sit to stand when asked what she was going to do for both sit to stand and stand to sit      Vision                     Perception     Praxis      Cognition   Behavior During Therapy: [facility] for tasks assessed/performed Overall Cognitive Status: Within Functional  Limits for tasks assessed                       Extremity/Trunk Assessment               Exercises     Shoulder Instructions       General Comments      Pertinent Vitals/ Pain       Pain Score: 4  Pain Location: R knee Pain Descriptors / Indicators: Sore Pain Intervention(s): Monitored during session  Home Living                                          Prior Functioning/Environment              Frequency Min 2X/week     Progress Toward Goals  OT Goals(current goals can now be found in the care plan section)  Progress towards OT goals: Goals met; OT discontinued     Plan      Co-evaluation  End of Session     Activity Tolerance Patient tolerated treatment well   Patient Left in bed;with call bell/phone within reach;with family/visitor present   Nurse Communication          Time: 2395-3202 OT Time Calculation (min): 28 min  Charges: OT General Charges $OT Visit: 1 Procedure OT Treatments $Self Care/Home Management : 23-37 mins  Chelsea Hancock 11/22/2014, 8:24 AM  Chelsea Hancock, OTR/L [phone] 11/22/2014

## 2014-11-22 NOTE — Discharge Summary (Signed)
Physician Discharge Summary   Patient ID: Chelsea Hancock MRN: [id number] DOB/AGE: [birth year] 81 y.o.  Admit date: 11/20/2014 Discharge date: 11/22/2014  Primary Diagnosis:  Osteoarthritis Right knee(s) Admission Diagnoses:  Past Medical History  Diagnosis Date  . Hyperlipidemia     takes Zetia and Simvastatin nightly  . Palpitation   . Carotid bruit     Right  . Edema   . Arthritis   . Allergy     portabella mushrooms/diarrhea  . Heart murmur   . Osteoporosis   . Fibromyalgia   . Thrush 03/23/2012  . Vaginitis 03/23/2012  . Sigmoid colon ulcer 03/23/2012  . Hyperglycemia 04/27/2012  . History of migraine     last about 19yr ago  . Foot drop, right   . Joint pain   . Joint swelling   . Chronic back pain   . Skin problem in pregnancy     takes gabapentin 3 times a day and also has an ointment  . GERD (gastroesophageal reflux disease)     takes Zantac and Nexium daily  . Hemorrhoids   . Diverticulosis   . IBS (irritable bowel syndrome)     takes align daily  . Hypocalcemia 03/29/2013  . Depression with anxiety 03/29/2013    hx of  . Tachycardia 07/03/2013  . Onychomycosis 07/03/2013  . Adenomatous polyp of colon 1991, 2008  . Medicare annual wellness visit, subsequent 08/13/2014  . Dysrhythmia     paroxysmal A fib, irregular heartbeat;takes Bisoprolol nightly  . Migraine 04/27/2012    last 40 years ago  . Falls frequently    Discharge Diagnoses:   Principal Problem:   OA (osteoarthritis) of knee  Estimated body mass index is 26.4 kg/(m^2) as calculated from the following:   Height as of this encounter: 5' 3"  (1.6 m).   Weight as of this encounter: 67.586 kg (149 lb).  Procedure:  Procedure(s) (LRB): RIGHT TOTAL KNEE ARTHROPLASTY (Right)   Consults: None  HPI: Chelsea PFISTERis a 81y.o. year old female with end stage OA of her right knee with progressively worsening pain and dysfunction. She has constant pain, with activity and at rest and  significant functional deficits with difficulties even with ADLs. She has had extensive non-op management including analgesics, injections of cortisone and viscosupplements, and home exercise program, but remains in significant pain with significant dysfunction.Radiographs show bone on bone arthritis lateral and patelloefmoral. She presents now for right Total Knee Arthroplasty.   Laboratory Data: Admission on 11/20/2014, Discharged on 11/22/2014  Component Date Value Ref Range Status  . Prothrombin Time 11/20/2014 13.6  11.6 - 15.2 seconds Final  . INR 11/20/2014 1.03  0.00 - 1.49 Final  . WBC 11/21/2014 10.7* 4.0 - 10.5 K/uL Final  . RBC 11/21/2014 3.33* 3.87 - 5.11 MIL/uL Final  . Hemoglobin 11/21/2014 10.2* 12.0 - 15.0 g/dL Final  . HCT 11/21/2014 30.9* 36.0 - 46.0 % Final  . MCV 11/21/2014 92.8  78.0 - 100.0 fL Final  . MCH 11/21/2014 30.6  26.0 - 34.0 pg Final  . MCHC 11/21/2014 33.0  30.0 - 36.0 g/dL Final  . RDW 11/21/2014 13.7  11.5 - 15.5 % Final  . Platelets 11/21/2014 200  150 - 400 K/uL Final  . Sodium 11/21/2014 136  135 - 145 mmol/L Final  . Potassium 11/21/2014 4.3  3.5 - 5.1 mmol/L Final  . Chloride 11/21/2014 106  96 - 112 mmol/L Final  . CO2 11/21/2014 23  19 - 32 mmol/L Final  .  Glucose, Bld 11/21/2014 165* 70 - 99 mg/dL Final  . BUN 11/21/2014 11  6 - 23 mg/dL Final  . Creatinine, Ser 11/21/2014 0.64  0.50 - 1.10 mg/dL Final  . Calcium 11/21/2014 8.1* 8.4 - 10.5 mg/dL Final  . GFR calc non Af Amer 11/21/2014 83* >90 mL/min Final  . GFR calc Af Amer 11/21/2014 >90  >90 mL/min Final   Comment: (NOTE) The eGFR has been calculated using the CKD EPI equation. This calculation has not been validated in all clinical situations. eGFR's persistently <90 mL/min signify possible Chronic Kidney Disease.   . Anion gap 11/21/2014 7  5 - 15 Final  . Prothrombin Time 11/21/2014 14.6  11.6 - 15.2 seconds Final  . INR 11/21/2014 1.13  0.00 - 1.49 Final  . WBC 11/22/2014 13.5*  4.0 - 10.5 K/uL Final  . RBC 11/22/2014 3.48* 3.87 - 5.11 MIL/uL Final  . Hemoglobin 11/22/2014 10.6* 12.0 - 15.0 g/dL Final  . HCT 11/22/2014 32.2* 36.0 - 46.0 % Final  . MCV 11/22/2014 92.5  78.0 - 100.0 fL Final  . MCH 11/22/2014 30.5  26.0 - 34.0 pg Final  . MCHC 11/22/2014 32.9  30.0 - 36.0 g/dL Final  . RDW 11/22/2014 14.2  11.5 - 15.5 % Final  . Platelets 11/22/2014 187  150 - 400 K/uL Final  . Sodium 11/22/2014 136  135 - 145 mmol/L Final  . Potassium 11/22/2014 4.6  3.5 - 5.1 mmol/L Final  . Chloride 11/22/2014 107  96 - 112 mmol/L Final  . CO2 11/22/2014 22  19 - 32 mmol/L Final  . Glucose, Bld 11/22/2014 147* 70 - 99 mg/dL Final  . BUN 11/22/2014 17  6 - 23 mg/dL Final  . Creatinine, Ser 11/22/2014 0.70  0.50 - 1.10 mg/dL Final  . Calcium 11/22/2014 8.7  8.4 - 10.5 mg/dL Final  . GFR calc non Af Amer 11/22/2014 81* >90 mL/min Final  . GFR calc Af Amer 11/22/2014 >90  >90 mL/min Final   Comment: (NOTE) The eGFR has been calculated using the CKD EPI equation. This calculation has not been validated in all clinical situations. eGFR's persistently <90 mL/min signify possible Chronic Kidney Disease.   . Anion gap 11/22/2014 7  5 - 15 Final  . Prothrombin Time 11/22/2014 15.8* 11.6 - 15.2 seconds Final  . INR 11/22/2014 1.25  0.00 - 1.49 Final  Hospital Outpatient Visit on 11/14/2014  Component Date Value Ref Range Status  . Color, Urine 11/14/2014 YELLOW  YELLOW Final  . APPearance 11/14/2014 CLEAR  CLEAR Final  . Specific Gravity, Urine 11/14/2014 1.025  1.005 - 1.030 Final  . pH 11/14/2014 6.0  5.0 - 8.0 Final  . Glucose, UA 11/14/2014 NEGATIVE  NEGATIVE mg/dL Final  . Hgb urine dipstick 11/14/2014 TRACE* NEGATIVE Final  . Bilirubin Urine 11/14/2014 NEGATIVE  NEGATIVE Final  . Ketones, ur 11/14/2014 NEGATIVE  NEGATIVE mg/dL Final  . Protein, ur 11/14/2014 NEGATIVE  NEGATIVE mg/dL Final  . Urobilinogen, UA 11/14/2014 0.2  0.0 - 1.0 mg/dL Final  . Nitrite 11/14/2014  NEGATIVE  NEGATIVE Final  . Leukocytes, UA 11/14/2014 TRACE* NEGATIVE Final  . MRSA, PCR 11/14/2014 NEGATIVE  NEGATIVE Final  . Staphylococcus aureus 11/14/2014 POSITIVE* NEGATIVE Final   Comment:        The Xpert SA Assay (FDA approved for NASAL specimens in patients over 63 years of age), is one component of a comprehensive surveillance program.  Test performance has been validated by [facility] for patients greater  than or equal to 70 year old. It is not intended to diagnose infection nor to guide or monitor treatment.   Marland Kitchen aPTT 11/14/2014 43* 24 - 37 seconds Final   Comment:        IF BASELINE aPTT IS ELEVATED, SUGGEST PATIENT RISK ASSESSMENT BE USED TO DETERMINE APPROPRIATE ANTICOAGULANT THERAPY.   . WBC 11/14/2014 6.5  4.0 - 10.5 K/uL Final  . RBC 11/14/2014 4.44  3.87 - 5.11 MIL/uL Final  . Hemoglobin 11/14/2014 13.3  12.0 - 15.0 g/dL Final  . HCT 11/14/2014 41.0  36.0 - 46.0 % Final  . MCV 11/14/2014 92.3  78.0 - 100.0 fL Final  . MCH 11/14/2014 30.0  26.0 - 34.0 pg Final  . MCHC 11/14/2014 32.4  30.0 - 36.0 g/dL Final  . RDW 11/14/2014 13.4  11.5 - 15.5 % Final  . Platelets 11/14/2014 228  150 - 400 K/uL Final  . Sodium 11/14/2014 141  135 - 145 mmol/L Final  . Potassium 11/14/2014 4.3  3.5 - 5.1 mmol/L Final  . Chloride 11/14/2014 106  96 - 112 mmol/L Final  . CO2 11/14/2014 28  19 - 32 mmol/L Final  . Glucose, Bld 11/14/2014 106* 70 - 99 mg/dL Final  . BUN 11/14/2014 24* 6 - 23 mg/dL Final  . Creatinine, Ser 11/14/2014 0.77  0.50 - 1.10 mg/dL Final  . Calcium 11/14/2014 9.6  8.4 - 10.5 mg/dL Final  . Total Protein 11/14/2014 6.8  6.0 - 8.3 g/dL Final  . Albumin 11/14/2014 4.1  3.5 - 5.2 g/dL Final  . AST 11/14/2014 32  0 - 37 U/L Final  . ALT 11/14/2014 26  0 - 35 U/L Final  . Alkaline Phosphatase 11/14/2014 78  39 - 117 U/L Final  . Total Bilirubin 11/14/2014 0.5  0.3 - 1.2 mg/dL Final  . GFR calc non Af Amer 11/14/2014 78* >90 mL/min Final  . GFR calc Af  Amer 11/14/2014 >90  >90 mL/min Final   Comment: (NOTE) The eGFR has been calculated using the CKD EPI equation. This calculation has not been validated in all clinical situations. eGFR's persistently <90 mL/min signify possible Chronic Kidney Disease.   . Anion gap 11/14/2014 7  5 - 15 Final  . Prothrombin Time 11/14/2014 26.3* 11.6 - 15.2 seconds Final  . INR 11/14/2014 2.40* 0.00 - 1.49 Final  . ABO/RH(D) 11/14/2014 O POS   Final  . Antibody Screen 11/14/2014 NEG   Final  . Sample Expiration 11/14/2014 11/23/2014   Final  . Squamous Epithelial / LPF 11/14/2014 RARE  RARE Final  . WBC, UA 11/14/2014 0-2  <3 WBC/hpf Final  . RBC / HPF 11/14/2014 0-2  <3 RBC/hpf Final  Anti-coag visit on 10/30/2014  Component Date Value Ref Range Status  . INR 10/30/2014 2.4   Final  Anti-coag visit on 10/20/2014  Component Date Value Ref Range Status  . INR 10/20/2014 5.4   Final  Anti-coag visit on 10/06/2014  Component Date Value Ref Range Status  . INR 10/06/2014 1.8   Final  Anti-coag visit on 09/29/2014  Component Date Value Ref Range Status  . INR 09/29/2014 2.4   Final     X-Rays:Dg Chest 2 View  11/22/2014   CLINICAL DATA:  Expiratory wheezing. Cough. Postop from knee arthroplasty.  EXAM: CHEST  2 VIEW  COMPARISON:  05/04/2014  FINDINGS: The heart size and mediastinal contours are within normal limits. Both lungs are clear. No evidence of pleural effusion. No mass or lymphadenopathy  identified. Fusion hardware again noted in the lower cervical and upper thoracic spine.  IMPRESSION: No active cardiopulmonary disease.   Electronically Signed   By: Earle Gell M.D.   On: 11/22/2014 12:56    EKG: Orders placed or performed during the hospital encounter of 11/20/14  . EKG 12-Lead  . EKG 12-Lead  . EKG 12-Lead  . EKG 12-Lead     Hospital Course: Chelsea Hancock is a 81 y.o. who was admitted to [facility]. They were brought to the operating room on 11/20/2014 and underwent  Procedure(s): RIGHT TOTAL KNEE ARTHROPLASTY.  Patient tolerated the procedure well and was later transferred to the recovery room and then to the orthopaedic floor for postoperative care.  They were given PO and IV analgesics for pain control following their surgery.  They were given 24 hours of postoperative antibiotics of  Anti-infectives    Start     Dose/Rate Route Frequency Ordered Stop   11/20/14 2030  vancomycin (VANCOCIN) IVPB 1000 mg/200 mL premix     1,000 mg 200 mL/hr over 60 Minutes Intravenous Every 12 hours 11/20/14 1118 11/20/14 2042   11/20/14 0628  vancomycin (VANCOCIN) IVPB 1000 mg/200 mL premix     1,000 mg 200 mL/hr over 60 Minutes Intravenous On call to O.R. 11/20/14 2542 11/20/14 0835     and started on DVT prophylaxis in the form of Coumadin.   PT and OT were ordered for total joint protocol.  Discharge planning consulted to help with postop disposition and equipment needs.  Patient had a good night on the evening of surgery and walked 20 feet the day of surgery.  They started to get up OOB with therapy on day one. Hemovac drain was pulled without difficulty.  Continued to work with therapy into day two.  Dressing was changed on day two and the incision was healing well. She told the case manager that she had had some left chest wall discomfort but denied any discomfort on reevaluation. She also described some expiratory wheezing on forced exhalation.  She was found to be in no distress but checked CXR, EKG due the the arrhythmia history, pulse ox and vitals. Her chest wall discomfort was lateral chest wall and was reproducible with compression of the ribs. CHEST 2 VIEW The heart size and mediastinal contours are within normal limits. Both lungs are clear. No evidence of pleural effusion. No mass or lymphadenopathy identified. Fusion hardware again noted in the lower cervical and upper thoracic spine. IMPRESSION: No active cardiopulmonary disease. EKG showed some sinus  brady but no changes.  It was noted that she had been running pulse in the 50's to 60's during the hospital stay.  Her BP was rechecked with systolic int he 706'C.  She was kept until the afternoon for monitoring.  She did well with the therapy session and no further complaints. Patient was ready to go home later that day.  Discharge home with home health with faimly Diet - Cardiac diet Follow up - in 2 weeks on Tuesday 3/15 with Dr. Wynelle Link Activity - WBAT Disposition - Home with family Condition Upon Discharge - improving D/C Meds - See DC Summary DVT Prophylaxis - Lovenox and Coumadin      Discharge Instructions    Call MD / Call 911    Complete by:  As directed   If you experience chest pain or shortness of breath, CALL 911 and be transported to the hospital emergency room.  If you develope a  fever above 101 F, pus (white drainage) or increased drainage or redness at the wound, or calf pain, call your surgeon's office.     Change dressing    Complete by:  As directed   Change dressing daily with sterile 4 x 4 inch gauze dressing and apply TED hose. Do not submerge the incision under water.     Constipation Prevention    Complete by:  As directed   Drink plenty of fluids.  Prune juice may be helpful.  You may use a stool softener, such as Colace (over the counter) 100 mg twice a day.  Use MiraLax (over the counter) for constipation as needed.     Diet - low sodium heart healthy    Complete by:  As directed      Discharge instructions    Complete by:  As directed   Pick up stool softner and laxative for home use following surgery while on pain medications. Do not submerge incision under water. Please use good hand washing techniques while changing dressing each day. May shower starting three days after surgery. Please use a clean towel to pat the incision dry following showers. Continue to use ice for pain and swelling after surgery. Do not use any lotions or creams on the  incision until instructed by your surgeon.  Take Coumadin for three weeks for postoperative protocol and then the patient may resume their previous Coumadin home regimen.  The dose may need to be adjusted based upon the INR.  Please follow the INR and titrate Coumadin dose for a therapeutic range between 2.0 and 3.0 INR.  After completing the three weeks of Coumadin, the patient may resume their previous Coumadin home regimen.  Continue Lovenox injections until the INR is therapeutic at or greater than 2.0.  When INR reaches the therapeutic level of equal to or greater than 2.0, the patient may discontinue the Lovenox injections.   Postoperative Constipation Protocol  Constipation - defined medically as fewer than three stools per week and severe constipation as less than one stool per week.  One of the most common issues patients have following surgery is constipation.  Even if you have a regular bowel pattern at home, your normal regimen is likely to be disrupted due to multiple reasons following surgery.  Combination of anesthesia, postoperative narcotics, change in appetite and fluid intake all can affect your bowels.  In order to avoid complications following surgery, here are some recommendations in order to help you during your recovery period.  Colace (docusate) - Pick up an over-the-counter form of Colace or another stool softener and take twice a day as long as you are requiring postoperative pain medications.  Take with a full glass of water daily.  If you experience loose stools or diarrhea, hold the colace until you stool forms back up.  If your symptoms do not get better within 1 week or if they get worse, check with your doctor.  Dulcolax (bisacodyl) - Pick up over-the-counter and take as directed by the product packaging as needed to assist with the movement of your bowels.  Take with a full glass of water.  Use this product as needed if not relieved by Colace only.   MiraLax  (polyethylene glycol) - Pick up over-the-counter to have on hand.  MiraLax is a solution that will increase the amount of water in your bowels to assist with bowel movements.  Take as directed and can mix with a glass of water, juice, soda, coffee,  or tea.  Take if you go more than two days without a movement. Do not use MiraLax more than once per day. Call your doctor if you are still constipated or irregular after using this medication for 7 days in a row.  If you continue to have problems with postoperative constipation, please contact the office for further assistance and recommendations.  If you experience "the worst abdominal pain ever" or develop nausea or vomiting, please contact the office immediatly for further recommendations for treatment.     Do not put a pillow under the knee. Place it under the heel.    Complete by:  As directed      Do not sit on low chairs, stoools or toilet seats, as it may be difficult to get up from low surfaces    Complete by:  As directed      Driving restrictions    Complete by:  As directed   No driving until released by the physician.     Increase activity slowly as tolerated    Complete by:  As directed      Lifting restrictions    Complete by:  As directed   No lifting until released by the physician.     Patient may shower    Complete by:  As directed   You may shower without a dressing once there is no drainage.  Do not wash over the wound.  If drainage remains, do not shower until drainage stops.     TED hose    Complete by:  As directed   Use stockings (TED hose) for 3 weeks on both leg(s).  You may remove them at night for sleeping.     Weight bearing as tolerated    Complete by:  As directed   Laterality:  right  Extremity:  Lower            Medication List    STOP taking these medications        ALIGN PO     Biotin 5 MG Caps     CALCIUM 600+D 600-200 MG-UNIT Tabs  Generic drug:  Calcium Carbonate-Vitamin D     Eszopiclone 3  MG Tabs     FLUZONE HIGH-DOSE 0.5 ML Susy  Generic drug:  Influenza Vac Split High-Dose     multivitamin with minerals Tabs tablet     OMEGA 3 PO     OVER THE COUNTER MEDICATION     RECLAST 5 MG/100ML Soln injection  Generic drug:  zoledronic acid      TAKE these medications        bisoprolol 5 MG tablet  Commonly known as:  ZEBETA  TAKE 1 TABLET BY MOUTH TWICE DAILY     CARTIA XT 120 MG 24 hr capsule  Generic drug:  diltiazem  Take 120 mg by mouth every morning.     citalopram 10 MG tablet  Commonly known as:  CELEXA  TAKE 1 TABLET BY MOUTH EVERY DAY     clobetasol ointment 0.05 %  Commonly known as:  TEMOVATE  Apply 1 application topically 2 (two) times daily as needed (for itching).     enoxaparin 40 MG/0.4ML injection  Commonly known as:  LOVENOX  Inject 0.4 mLs (40 mg total) into the skin daily. Continue Lovenox injections until the INR is therapeutic at or greater than 2.0.  When INR reaches the therapeutic level of equal to or greater than 2.0, the patient may discontinue the Lovenox injections.  gabapentin 600 MG tablet  Commonly known as:  NEURONTIN  TAKE 1 TABLET BY MOUTH THREE TIMES DAILY     hydrOXYzine 10 MG tablet  Commonly known as:  ATARAX/VISTARIL  TAKE 1 TABLET BY MOUTH EVERY 6 HOURS AS NEEDED FOR ITCHING OR ANXIETY     loperamide 2 MG capsule  Commonly known as:  IMODIUM  Take 2 mg by mouth as needed for diarrhea or loose stools.     methocarbamol 500 MG tablet  Commonly known as:  ROBAXIN  Take 1 tablet (500 mg total) by mouth every 6 (six) hours as needed for muscle spasms.     NEXIUM 40 MG capsule  Generic drug:  esomeprazole  TAKE ONE CAPSULE BY MOUTH EVERY DAY AT 2:00 PM     nortriptyline 25 MG capsule  Commonly known as:  PAMELOR  TAKE 2 CAPSULES BY MOUTH EVERY NIGHT AT BEDTIME     oxyCODONE 5 MG immediate release tablet  Commonly known as:  Oxy IR/ROXICODONE  Take 1-2 tablets (5-10 mg total) by mouth every 3 (three) hours as  needed for moderate pain, severe pain or breakthrough pain.     ranitidine 300 MG tablet  Commonly known as:  ZANTAC  TAKE 1 TABLET BY MOUTH AT BEDTIME     simvastatin 40 MG tablet  Commonly known as:  ZOCOR  TAKE 1 TABLET BY MOUTH EVERY EVENING     torsemide 10 MG tablet  Commonly known as:  DEMADEX  TAKE 1 TABLET BY MOUTH EVERY MORNING     warfarin 5 MG tablet  Commonly known as:  COUMADIN  Take 1 tablet (5 mg total) by mouth daily. Take Coumadin for three weeks for postoperative protocol and then the patient may resume their previous Coumadin home regimen.  The dose may need to be adjusted based upon the INR.  Please follow the INR and titrate Coumadin dose for a therapeutic range between 2.0 and 3.0 INR.  After completing the three weeks of Coumadin, the patient may resume their previous Coumadin home regimen.     warfarin 5 MG tablet  Commonly known as:  COUMADIN  Take 1 tablet (5 mg total) by mouth one time only at 6 PM.     ZETIA 10 MG tablet  Generic drug:  ezetimibe  TAKE 1 TABLET BY MOUTH EVERY DAY       Follow-up Information    Follow up with [facility].   Why:  home health physical therapy and nurse for INR draws   Contact information:   [address] [place] [address] [phone]       Follow up with [name], MD. Schedule an appointment as soon as possible for a visit on 12/05/2014.   Specialty:  Orthopedic Surgery   Why:  Call office at [phone] to set up appointment on Tuesday 12/05/2014   Contact information:   [address] [place] [address] [phone]       Signed: Arlee Muslim, PA-C Orthopaedic Surgery 12/14/2014, 4:27 AM

## 2014-11-22 NOTE — Progress Notes (Signed)
Chelsea Hancock for warfarin Indication: atrial fibrillation; VTE prophylaxis s/p R TKA  Allergies  Allergen Reactions  . Cefuroxime Axetil Anaphylaxis    unknown  . Macrodantin Anaphylaxis  . Alendronate Sodium Other (See Comments)    Severe chest pain similar to heart attack   . Codeine Nausea And Vomiting  . Demerol [Meperidine] Other (See Comments)    hallucinate  . Meperidine Hcl Nausea And Vomiting  . Other Other (See Comments)    Hismanal and Maprobamate  portobello mushrooms - diarrhea  . Sulfonamide Derivatives Hives, Itching and Swelling    Patient Measurements: Height: 5\' 3"  (160 cm) Weight: 149 lb (67.586 kg) IBW/kg (Calculated) : 52.4  Vital Signs: Temp: 98.5 F (36.9 C) (03/02 0924) Temp Source: Oral (03/02 0924) BP: 124/50 mmHg (03/02 0924) Pulse Rate: 50 (03/02 0924)  Labs:  Recent Labs  11/20/14 0705 11/21/14 0410 11/22/14 0528  HGB  --  10.2* 10.6*  HCT  --  30.9* 32.2*  PLT  --  200 187  LABPROT 13.6 14.6 15.8*  INR 1.03 1.13 1.25  CREATININE  --  0.64 0.70    Estimated Creatinine Clearance: 53.5 mL/min (by C-G formula based on Cr of 0.7).   Medical History: Past Medical History  Diagnosis Date  . Hyperlipidemia     takes Zetia and Simvastatin nightly  . Palpitation   . Carotid bruit     Right  . Edema   . Arthritis   . Allergy     portabella mushrooms/diarrhea  . Heart murmur   . Osteoporosis   . Fibromyalgia   . Thrush 03/23/2012  . Vaginitis 03/23/2012  . Sigmoid colon ulcer 03/23/2012  . Hyperglycemia 04/27/2012  . History of migraine     last about 52yrs ago  . Foot drop, right   . Joint pain   . Joint swelling   . Chronic back pain   . Skin problem in pregnancy     takes gabapentin 3 times a day and also has an ointment  . GERD (gastroesophageal reflux disease)     takes Zantac and Nexium daily  . Hemorrhoids   . Diverticulosis   . IBS (irritable bowel syndrome)     takes align daily   . Hypocalcemia 03/29/2013  . Depression with anxiety 03/29/2013    hx of  . Tachycardia 07/03/2013  . Onychomycosis 07/03/2013  . Adenomatous polyp of colon 1991, 2008  . Medicare annual wellness visit, subsequent 08/13/2014  . Dysrhythmia     paroxysmal A fib, irregular heartbeat;takes Bisoprolol nightly  . Migraine 04/27/2012    last 40 years ago  . Falls frequently     Medications:  Scheduled:  . atorvastatin  20 mg Oral q1800  . bisoprolol  5 mg Oral BID  . citalopram  10 mg Oral q morning - 10a  . diltiazem  120 mg Oral q morning - 10a  . docusate sodium  100 mg Oral BID  . enoxaparin (LOVENOX) injection  40 mg Subcutaneous Q24H  . esomeprazole  40 mg Oral QHS  . ezetimibe  10 mg Oral QHS  . famotidine  20 mg Oral QHS  . gabapentin  600 mg Oral TID  . nortriptyline  50 mg Oral QHS  . torsemide  10 mg Oral q morning - 10a  . Warfarin - Pharmacist Dosing Inpatient   Does not apply q1800   Infusions:  . sodium chloride 30 mL/hr at 11/21/14 3664    Assessment:  81 yo s/p R TKA 2/29. To start warfarin per pharmacy dosing post op. Patient was taking warfarin PTA for hx Aflutter at a home dose of 5mg  nightly except 7.5mg  on Tues/Fri. Baseline labs good  Goal of Therapy:  INR 2-3   Today, 11/22/2014 INR beginning to respond after warfarin restarted last night (see MAR) Lovenox 40 mg SQ q24h began this AM Hgb low but stable and >10 Pltc WNL No bleeding reported. Plans for possible discharge later today noted.  Plan:  1) Warfarin 5 mg PO x 1 tonight 2) Continue SQ Lovenox as ordered by ortho 3) Daily PT/INR while inpatient  Clayburn Pert, PharmD, BCPS Pager: [phone] 11/22/2014  1:36 PM

## 2014-11-22 NOTE — Discharge Instructions (Signed)
° °Dr. Frank Aluisio °[facility]ist °[facility] °[address] °[place], [place] [address] °[phone] ° °TOTAL KNEE REPLACEMENT POSTOPERATIVE DIRECTIONS ° ° ° °Knee Rehabilitation, Guidelines Following Surgery  °Results after knee surgery are often greatly improved when you follow the exercise, range of motion and muscle strengthening exercises prescribed by your doctor. Safety measures are also important to protect the knee from further injury. Any time any of these exercises cause you to have increased pain or swelling in your knee joint, decrease the amount until you are comfortable again and slowly increase them. If you have problems or questions, call your caregiver or physical therapist for advice.  ° °HOME CARE INSTRUCTIONS  °Remove items at home which could result in a fall. This includes throw rugs or furniture in walking pathways.  °Continue medications as instructed at time of discharge. °You may have some home medications which will be placed on hold until you complete the course of blood thinner medication.  °You may start showering once you are discharged home but do not submerge the incision under water. Just pat the incision dry and apply a dry gauze dressing on daily. °Walk with walker as instructed.  °You may resume a sexual relationship in one month or when given the OK by  your doctor.  °· Use walker as long as suggested by your caregivers. °· Avoid periods of inactivity such as sitting longer than an hour when not asleep. This helps prevent blood clots.  °You may put full weight on your legs and walk as much as is comfortable.  °You may return to work once you are cleared by your doctor.  °Do not drive a car for 6 weeks or until released by you surgeon.  °· Do not drive while taking narcotics.  °Wear the elastic stockings for three weeks following surgery during the day but you may remove then at night. °Make sure you keep all of your appointments after your  operation with all of your doctors and caregivers. You should call the office at the above phone number and make an appointment for approximately two weeks after the date of your surgery. °Change the dressing daily and reapply a dry dressing each time. °Please pick up a stool softener and laxative for home use as long as you are requiring pain medications. °· ICE to the affected knee every three hours for 30 minutes at a time and then as needed for pain and swelling.  Continue to use ice on the knee for pain and swelling from surgery. You may notice swelling that will progress down to the foot and ankle.  This is normal after surgery.  Elevate the leg when you are not up walking on it.   °It is important for you to complete the blood thinner medication as prescribed by your doctor. °· Continue to use the breathing machine which will help keep your temperature down.  It is common for your temperature to cycle up and down following surgery, especially at night when you are not up moving around and exerting yourself.  The breathing machine keeps your lungs expanded and your temperature down. ° °RANGE OF MOTION AND STRENGTHENING EXERCISES  °Rehabilitation of the knee is important following a knee injury or an operation. After just a few days of immobilization, the muscles of the thigh which control the knee become weakened and shrink (atrophy). Knee exercises are designed to build up the tone and strength of the thigh muscles and to improve knee   motion. Often times heat used for twenty to thirty minutes before working out will loosen up your tissues and help with improving the range of motion but do not use heat for the first two weeks following surgery. These exercises can be done on a training (exercise) mat, on the floor, on a table or on a bed. Use what ever works the best and is most comfortable for you Knee exercises include:  Leg Lifts - While your knee is still immobilized in a splint or cast, you can do  straight leg raises. Lift the leg to 60 degrees, hold for 3 sec, and slowly lower the leg. Repeat 10-20 times 2-3 times daily. Perform this exercise against resistance later as your knee gets better.  Quad and Hamstring Sets - Tighten up the muscle on the front of the thigh (Quad) and hold for 5-10 sec. Repeat this 10-20 times hourly. Hamstring sets are done by pushing the foot backward against an object and holding for 5-10 sec. Repeat as with quad sets.  A rehabilitation program following serious knee injuries can speed recovery and prevent re-injury in the future due to weakened muscles. Contact your doctor or a physical therapist for more information on knee rehabilitation.   SKILLED REHAB INSTRUCTIONS: If the patient is transferred to a skilled rehab facility following release from the hospital, a list of the current medications will be sent to the facility for the patient to continue.  When discharged from the skilled rehab facility, please have the facility set up the patient's [place] prior to being released. Also, the skilled facility will be responsible for providing the patient with their medications at time of release from the facility to include their pain medication, the muscle relaxants, and their blood thinner medication. If the patient is still at the rehab facility at time of the two week follow up appointment, the skilled rehab facility will also need to assist the patient in arranging follow up appointment in our office and any transportation needs.  MAKE SURE YOU:  Understand these instructions.  Will watch your condition.  Will get help right away if you are not doing well or get worse.    Pick up stool softner and laxative for home use following surgery while on pain medications. Do not submerge incision under water. Please use good hand washing techniques while changing dressing each day. May shower starting three days after surgery. Please use a clean  towel to pat the incision dry following showers. Continue to use ice for pain and swelling after surgery. Do not use any lotions or creams on the incision until instructed by your surgeon.  Take Coumadin for three weeks for postoperative protocol and then the patient may resume their previous Coumadin home regimen.  The dose may need to be adjusted based upon the INR.  Please follow the INR and titrate Coumadin dose for a therapeutic range between 2.0 and 3.0 INR.  After completing the three weeks of Coumadin, the patient may resume their previous Coumadin home regimen.  Continue Lovenox injections until the INR is therapeutic at or greater than 2.0.  When INR reaches the therapeutic level of equal to or greater than 2.0, the patient may discontinue the Lovenox injections.  Postoperative Constipation Protocol  Constipation - defined medically as fewer than three stools per week and severe constipation as less than one stool per week.  One of the most common issues patients have following surgery is constipation.  Even if you have  a regular bowel pattern at home, your normal regimen is likely to be disrupted due to multiple reasons following surgery.  Combination of anesthesia, postoperative narcotics, change in appetite and fluid intake all can affect your bowels.  In order to avoid complications following surgery, here are some recommendations in order to help you during your recovery period.  Colace (docusate) - Pick up an over-the-counter form of Colace or another stool softener and take twice a day as long as you are requiring postoperative pain medications.  Take with a full glass of water daily.  If you experience loose stools or diarrhea, hold the colace until you stool forms back up.  If your symptoms do not get better within 1 week or if they get worse, check with your doctor.  Dulcolax (bisacodyl) - Pick up over-the-counter and take as directed by the product packaging as needed to assist  with the movement of your bowels.  Take with a full glass of water.  Use this product as needed if not relieved by Colace only.   MiraLax (polyethylene glycol) - Pick up over-the-counter to have on hand.  MiraLax is a solution that will increase the amount of water in your bowels to assist with bowel movements.  Take as directed and can mix with a glass of water, juice, soda, coffee, or tea.  Take if you go more than two days without a movement. Do not use MiraLax more than once per day. Call your doctor if you are still constipated or irregular after using this medication for 7 days in a row.  If you continue to have problems with postoperative constipation, please contact the office for further assistance and recommendations.  If you experience "the worst abdominal pain ever" or develop nausea or vomiting, please contact the office immediatly for further recommendations for treatment.

## 2014-11-24 DIAGNOSIS — G609 Hereditary and idiopathic neuropathy, unspecified: Secondary | ICD-10-CM | POA: Diagnosis not present

## 2014-11-24 DIAGNOSIS — I4891 Unspecified atrial fibrillation: Secondary | ICD-10-CM | POA: Diagnosis not present

## 2014-11-24 DIAGNOSIS — M797 Fibromyalgia: Secondary | ICD-10-CM | POA: Diagnosis not present

## 2014-11-24 DIAGNOSIS — Z471 Aftercare following joint replacement surgery: Secondary | ICD-10-CM | POA: Diagnosis not present

## 2014-11-24 DIAGNOSIS — F329 Major depressive disorder, single episode, unspecified: Secondary | ICD-10-CM | POA: Diagnosis not present

## 2014-11-24 DIAGNOSIS — M810 Age-related osteoporosis without current pathological fracture: Secondary | ICD-10-CM | POA: Diagnosis not present

## 2014-11-25 DIAGNOSIS — Z471 Aftercare following joint replacement surgery: Secondary | ICD-10-CM | POA: Diagnosis not present

## 2014-11-25 DIAGNOSIS — I4891 Unspecified atrial fibrillation: Secondary | ICD-10-CM | POA: Diagnosis not present

## 2014-11-25 DIAGNOSIS — M797 Fibromyalgia: Secondary | ICD-10-CM | POA: Diagnosis not present

## 2014-11-25 DIAGNOSIS — G609 Hereditary and idiopathic neuropathy, unspecified: Secondary | ICD-10-CM | POA: Diagnosis not present

## 2014-11-25 DIAGNOSIS — M810 Age-related osteoporosis without current pathological fracture: Secondary | ICD-10-CM | POA: Diagnosis not present

## 2014-11-25 DIAGNOSIS — F329 Major depressive disorder, single episode, unspecified: Secondary | ICD-10-CM | POA: Diagnosis not present

## 2014-11-27 ENCOUNTER — Other Ambulatory Visit (INDEPENDENT_AMBULATORY_CARE_PROVIDER_SITE_OTHER): Payer: Self-pay

## 2014-11-27 ENCOUNTER — Other Ambulatory Visit (INDEPENDENT_AMBULATORY_CARE_PROVIDER_SITE_OTHER): Payer: Self-pay | Admitting: Surgery

## 2014-11-27 DIAGNOSIS — M810 Age-related osteoporosis without current pathological fracture: Secondary | ICD-10-CM | POA: Diagnosis not present

## 2014-11-27 DIAGNOSIS — Z471 Aftercare following joint replacement surgery: Secondary | ICD-10-CM | POA: Diagnosis not present

## 2014-11-27 DIAGNOSIS — E041 Nontoxic single thyroid nodule: Secondary | ICD-10-CM

## 2014-11-27 DIAGNOSIS — G609 Hereditary and idiopathic neuropathy, unspecified: Secondary | ICD-10-CM | POA: Diagnosis not present

## 2014-11-27 DIAGNOSIS — I4891 Unspecified atrial fibrillation: Secondary | ICD-10-CM | POA: Diagnosis not present

## 2014-11-27 DIAGNOSIS — M797 Fibromyalgia: Secondary | ICD-10-CM | POA: Diagnosis not present

## 2014-11-27 DIAGNOSIS — F329 Major depressive disorder, single episode, unspecified: Secondary | ICD-10-CM | POA: Diagnosis not present

## 2014-11-27 NOTE — Addendum Note (Signed)
Addended by: Earnstine Regal on: 11/27/2014 03:45 PM   Modules accepted: Orders

## 2014-11-28 ENCOUNTER — Other Ambulatory Visit (INDEPENDENT_AMBULATORY_CARE_PROVIDER_SITE_OTHER): Payer: Self-pay | Admitting: *Deleted

## 2014-11-28 DIAGNOSIS — M810 Age-related osteoporosis without current pathological fracture: Secondary | ICD-10-CM | POA: Diagnosis not present

## 2014-11-28 DIAGNOSIS — Z471 Aftercare following joint replacement surgery: Secondary | ICD-10-CM | POA: Diagnosis not present

## 2014-11-28 DIAGNOSIS — F329 Major depressive disorder, single episode, unspecified: Secondary | ICD-10-CM | POA: Diagnosis not present

## 2014-11-28 DIAGNOSIS — E041 Nontoxic single thyroid nodule: Secondary | ICD-10-CM

## 2014-11-28 DIAGNOSIS — I4891 Unspecified atrial fibrillation: Secondary | ICD-10-CM | POA: Diagnosis not present

## 2014-11-28 DIAGNOSIS — M797 Fibromyalgia: Secondary | ICD-10-CM | POA: Diagnosis not present

## 2014-11-28 DIAGNOSIS — G609 Hereditary and idiopathic neuropathy, unspecified: Secondary | ICD-10-CM | POA: Diagnosis not present

## 2014-11-29 DIAGNOSIS — M810 Age-related osteoporosis without current pathological fracture: Secondary | ICD-10-CM | POA: Diagnosis not present

## 2014-11-29 DIAGNOSIS — F329 Major depressive disorder, single episode, unspecified: Secondary | ICD-10-CM | POA: Diagnosis not present

## 2014-11-29 DIAGNOSIS — M797 Fibromyalgia: Secondary | ICD-10-CM | POA: Diagnosis not present

## 2014-11-29 DIAGNOSIS — G609 Hereditary and idiopathic neuropathy, unspecified: Secondary | ICD-10-CM | POA: Diagnosis not present

## 2014-11-29 DIAGNOSIS — Z471 Aftercare following joint replacement surgery: Secondary | ICD-10-CM | POA: Diagnosis not present

## 2014-11-29 DIAGNOSIS — I4891 Unspecified atrial fibrillation: Secondary | ICD-10-CM | POA: Diagnosis not present

## 2014-11-30 ENCOUNTER — Other Ambulatory Visit: Payer: Medicare Other

## 2014-12-01 DIAGNOSIS — G609 Hereditary and idiopathic neuropathy, unspecified: Secondary | ICD-10-CM | POA: Diagnosis not present

## 2014-12-01 DIAGNOSIS — M810 Age-related osteoporosis without current pathological fracture: Secondary | ICD-10-CM | POA: Diagnosis not present

## 2014-12-01 DIAGNOSIS — M797 Fibromyalgia: Secondary | ICD-10-CM | POA: Diagnosis not present

## 2014-12-01 DIAGNOSIS — F329 Major depressive disorder, single episode, unspecified: Secondary | ICD-10-CM | POA: Diagnosis not present

## 2014-12-01 DIAGNOSIS — Z471 Aftercare following joint replacement surgery: Secondary | ICD-10-CM | POA: Diagnosis not present

## 2014-12-01 DIAGNOSIS — I4891 Unspecified atrial fibrillation: Secondary | ICD-10-CM | POA: Diagnosis not present

## 2014-12-03 DIAGNOSIS — R319 Hematuria, unspecified: Secondary | ICD-10-CM | POA: Diagnosis not present

## 2014-12-03 DIAGNOSIS — N3001 Acute cystitis with hematuria: Secondary | ICD-10-CM | POA: Diagnosis not present

## 2014-12-03 DIAGNOSIS — N390 Urinary tract infection, site not specified: Secondary | ICD-10-CM | POA: Diagnosis not present

## 2014-12-04 DIAGNOSIS — R262 Difficulty in walking, not elsewhere classified: Secondary | ICD-10-CM | POA: Diagnosis not present

## 2014-12-04 DIAGNOSIS — Z96651 Presence of right artificial knee joint: Secondary | ICD-10-CM | POA: Diagnosis not present

## 2014-12-05 DIAGNOSIS — Z7901 Long term (current) use of anticoagulants: Secondary | ICD-10-CM | POA: Diagnosis not present

## 2014-12-05 DIAGNOSIS — Z471 Aftercare following joint replacement surgery: Secondary | ICD-10-CM | POA: Diagnosis not present

## 2014-12-05 DIAGNOSIS — Z96651 Presence of right artificial knee joint: Secondary | ICD-10-CM | POA: Diagnosis not present

## 2014-12-05 DIAGNOSIS — Z5181 Encounter for therapeutic drug level monitoring: Secondary | ICD-10-CM | POA: Diagnosis not present

## 2014-12-06 DIAGNOSIS — Z96651 Presence of right artificial knee joint: Secondary | ICD-10-CM | POA: Diagnosis not present

## 2014-12-06 DIAGNOSIS — R262 Difficulty in walking, not elsewhere classified: Secondary | ICD-10-CM | POA: Diagnosis not present

## 2014-12-07 ENCOUNTER — Other Ambulatory Visit: Payer: Self-pay | Admitting: Family Medicine

## 2014-12-07 NOTE — Telephone Encounter (Signed)
Faxed hardcopy for Eszopiclone to [employer] [employer]

## 2014-12-08 DIAGNOSIS — R262 Difficulty in walking, not elsewhere classified: Secondary | ICD-10-CM | POA: Diagnosis not present

## 2014-12-08 DIAGNOSIS — Z96651 Presence of right artificial knee joint: Secondary | ICD-10-CM | POA: Diagnosis not present

## 2014-12-11 DIAGNOSIS — Z96651 Presence of right artificial knee joint: Secondary | ICD-10-CM | POA: Diagnosis not present

## 2014-12-11 DIAGNOSIS — R262 Difficulty in walking, not elsewhere classified: Secondary | ICD-10-CM | POA: Diagnosis not present

## 2014-12-13 DIAGNOSIS — R262 Difficulty in walking, not elsewhere classified: Secondary | ICD-10-CM | POA: Diagnosis not present

## 2014-12-13 DIAGNOSIS — Z96651 Presence of right artificial knee joint: Secondary | ICD-10-CM | POA: Diagnosis not present

## 2014-12-14 DIAGNOSIS — Z7901 Long term (current) use of anticoagulants: Secondary | ICD-10-CM | POA: Diagnosis not present

## 2014-12-15 DIAGNOSIS — R262 Difficulty in walking, not elsewhere classified: Secondary | ICD-10-CM | POA: Diagnosis not present

## 2014-12-15 DIAGNOSIS — Z96651 Presence of right artificial knee joint: Secondary | ICD-10-CM | POA: Diagnosis not present

## 2014-12-18 DIAGNOSIS — Z7901 Long term (current) use of anticoagulants: Secondary | ICD-10-CM | POA: Diagnosis not present

## 2014-12-18 DIAGNOSIS — R262 Difficulty in walking, not elsewhere classified: Secondary | ICD-10-CM | POA: Diagnosis not present

## 2014-12-18 DIAGNOSIS — Z96651 Presence of right artificial knee joint: Secondary | ICD-10-CM | POA: Diagnosis not present

## 2014-12-20 DIAGNOSIS — Z96651 Presence of right artificial knee joint: Secondary | ICD-10-CM | POA: Diagnosis not present

## 2014-12-20 DIAGNOSIS — R262 Difficulty in walking, not elsewhere classified: Secondary | ICD-10-CM | POA: Diagnosis not present

## 2014-12-21 ENCOUNTER — Other Ambulatory Visit: Payer: Self-pay | Admitting: Surgery

## 2014-12-22 DIAGNOSIS — Z96651 Presence of right artificial knee joint: Secondary | ICD-10-CM | POA: Diagnosis not present

## 2014-12-22 DIAGNOSIS — R262 Difficulty in walking, not elsewhere classified: Secondary | ICD-10-CM | POA: Diagnosis not present

## 2014-12-25 DIAGNOSIS — R262 Difficulty in walking, not elsewhere classified: Secondary | ICD-10-CM | POA: Diagnosis not present

## 2014-12-25 DIAGNOSIS — Z96651 Presence of right artificial knee joint: Secondary | ICD-10-CM | POA: Diagnosis not present

## 2014-12-26 DIAGNOSIS — Z96651 Presence of right artificial knee joint: Secondary | ICD-10-CM | POA: Diagnosis not present

## 2014-12-26 DIAGNOSIS — Z471 Aftercare following joint replacement surgery: Secondary | ICD-10-CM | POA: Diagnosis not present

## 2014-12-27 ENCOUNTER — Encounter: Payer: Self-pay | Admitting: Physician Assistant

## 2014-12-27 ENCOUNTER — Ambulatory Visit (INDEPENDENT_AMBULATORY_CARE_PROVIDER_SITE_OTHER): Payer: Medicare Other | Admitting: Physician Assistant

## 2014-12-27 ENCOUNTER — Other Ambulatory Visit (HOSPITAL_COMMUNITY)
Admission: RE | Admit: 2014-12-27 | Discharge: 2014-12-27 | Disposition: A | Discharge status: Home / Self Care | Payer: Medicare Other | Source: Ambulatory Visit | Attending: Physician Assistant | Admitting: Physician Assistant

## 2014-12-27 ENCOUNTER — Telehealth: Payer: Self-pay | Admitting: Surgery

## 2014-12-27 VITALS — BP 116/57 | HR 58 | Temp 98.3°F | Resp 16 | Ht 63.0 in | Wt 150.5 lb

## 2014-12-27 DIAGNOSIS — R399 Unspecified symptoms and signs involving the genitourinary system: Secondary | ICD-10-CM | POA: Diagnosis not present

## 2014-12-27 DIAGNOSIS — B9689 Other specified bacterial agents as the cause of diseases classified elsewhere: Secondary | ICD-10-CM

## 2014-12-27 DIAGNOSIS — R42 Dizziness and giddiness: Secondary | ICD-10-CM

## 2014-12-27 DIAGNOSIS — N898 Other specified noninflammatory disorders of vagina: Secondary | ICD-10-CM

## 2014-12-27 DIAGNOSIS — A499 Bacterial infection, unspecified: Secondary | ICD-10-CM

## 2014-12-27 DIAGNOSIS — N760 Acute vaginitis: Secondary | ICD-10-CM | POA: Diagnosis not present

## 2014-12-27 LAB — POCT URINALYSIS DIPSTICK
Bilirubin, UA: NEGATIVE
Blood, UA: NEGATIVE
Glucose, UA: NEGATIVE
Ketones, UA: NEGATIVE
Leukocytes, UA: NEGATIVE
Nitrite, UA: NEGATIVE
Protein, UA: NEGATIVE
Spec Grav, UA: 1.015
Urobilinogen, UA: 0.2
pH, UA: 6

## 2014-12-27 MED ORDER — METRONIDAZOLE 0.75 % EX GEL: CUTANEOUS | Status: DC

## 2014-12-27 NOTE — Patient Instructions (Signed)
Please use the Metrogel as directed. I will be calling you with your wet prep results. We will change your regimen if indicated. Please take a daily probiotic to help prevent these infections.

## 2014-12-28 ENCOUNTER — Ambulatory Visit (INDEPENDENT_AMBULATORY_CARE_PROVIDER_SITE_OTHER): Payer: Medicare Other | Admitting: *Deleted

## 2014-12-28 DIAGNOSIS — N760 Acute vaginitis: Secondary | ICD-10-CM | POA: Insufficient documentation

## 2014-12-28 DIAGNOSIS — B9689 Other specified bacterial agents as the cause of diseases classified elsewhere: Secondary | ICD-10-CM | POA: Insufficient documentation

## 2014-12-28 DIAGNOSIS — I4892 Unspecified atrial flutter: Secondary | ICD-10-CM | POA: Diagnosis not present

## 2014-12-28 DIAGNOSIS — M1711 Unilateral primary osteoarthritis, right knee: Secondary | ICD-10-CM | POA: Diagnosis not present

## 2014-12-28 LAB — POCT INR: INR: 2.2

## 2014-12-28 NOTE — Progress Notes (Signed)
Patient presents to clinic today c/o foul-smelling, yellow vaginal discharge first noted 3 days ago. Patient recently was treated for UTI. Denies any recurrence of urinary tract infection symptoms. Denies fever or chills. Is not sexually active. Does endorse history of bacterial vaginosis.  Past Medical History  Diagnosis Date  . Hyperlipidemia     takes Zetia and Simvastatin nightly  . Palpitation   . Carotid bruit     Right  . Edema   . Arthritis   . Allergy     portabella mushrooms/diarrhea  . Heart murmur   . Osteoporosis   . Fibromyalgia   . Thrush 03/23/2012  . Vaginitis 03/23/2012  . Sigmoid colon ulcer 03/23/2012  . Hyperglycemia 04/27/2012  . History of migraine     last about 5yr ago  . Foot drop, right   . Joint pain   . Joint swelling   . Chronic back pain   . Skin problem in pregnancy     takes gabapentin 3 times a day and also has an ointment  . GERD (gastroesophageal reflux disease)     takes Zantac and Nexium daily  . Hemorrhoids   . Diverticulosis   . IBS (irritable bowel syndrome)     takes align daily  . Hypocalcemia 03/29/2013  . Depression with anxiety 03/29/2013    hx of  . Tachycardia 07/03/2013  . Onychomycosis 07/03/2013  . Adenomatous polyp of colon 1991, 2008  . Medicare annual wellness visit, subsequent 08/13/2014  . Dysrhythmia     paroxysmal A fib, irregular heartbeat;takes Bisoprolol nightly  . Migraine 04/27/2012    last 40 years ago  . Falls frequently     Current Outpatient Prescriptions on File Prior to Visit  Medication Sig Dispense Refill  . bisoprolol (ZEBETA) 5 MG tablet TAKE 1 TABLET BY MOUTH TWICE DAILY 60 tablet 2  . CARTIA XT 120 MG 24 hr capsule Take 120 mg by mouth every morning.     . citalopram (CELEXA) 10 MG tablet TAKE 1 TABLET BY MOUTH EVERY DAY (Patient taking differently: Take one tablet by mouth every morning.) 30 tablet 5  . clobetasol ointment (TEMOVATE) 06.30% Apply 1 application topically 2 (two) times daily as  needed (for itching).    . Eszopiclone 3 MG TABS TAKE 1 TABLET BY MOUTH AT BEDTIME AS NEEDED 30 tablet 0  . gabapentin (NEURONTIN) 600 MG tablet TAKE 1 TABLET BY MOUTH THREE TIMES DAILY 90 tablet 4  . loperamide (IMODIUM) 2 MG capsule Take 2 mg by mouth as needed for diarrhea or loose stools.    .Marland KitchenNEXIUM 40 MG capsule TAKE ONE CAPSULE BY MOUTH EVERY DAY AT 2:00 PM (Patient taking differently: Take one capsule by mouth nightly at bedtime.) 30 capsule 4  . nortriptyline (PAMELOR) 25 MG capsule TAKE 2 CAPSULES BY MOUTH EVERY NIGHT AT BEDTIME 60 capsule 2  . oxyCODONE (OXY IR/ROXICODONE) 5 MG immediate release tablet Take 1-2 tablets (5-10 mg total) by mouth every 3 (three) hours as needed for moderate pain, severe pain or breakthrough pain. 90 tablet 0  . ranitidine (ZANTAC) 300 MG tablet TAKE 1 TABLET BY MOUTH AT BEDTIME 30 tablet 5  . simvastatin (ZOCOR) 40 MG tablet TAKE 1 TABLET BY MOUTH EVERY EVENING 30 tablet 4  . warfarin (COUMADIN) 5 MG tablet Take 1 tablet (5 mg total) by mouth daily. Take Coumadin for three weeks for postoperative protocol and then the patient may resume their previous Coumadin home regimen.  The dose  may need to be adjusted based upon the INR.  Please follow the INR and titrate Coumadin dose for a therapeutic range between 2.0 and 3.0 INR.  After completing the three weeks of Coumadin, the patient may resume their previous Coumadin home regimen. 45 tablet 3  . warfarin (COUMADIN) 5 MG tablet Take 1 tablet (5 mg total) by mouth one time only at 6 PM. 30 tablet 0  . ZETIA 10 MG tablet TAKE 1 TABLET BY MOUTH EVERY DAY (Patient taking differently: Take one tablet by mouth nightly at bedtime.) 30 tablet 5  . hydrOXYzine (ATARAX/VISTARIL) 10 MG tablet TAKE 1 TABLET BY MOUTH EVERY 6 HOURS AS NEEDED FOR ITCHING OR ANXIETY (Patient not taking: Reported on 12/27/2014) 60 tablet 2  . torsemide (DEMADEX) 10 MG tablet TAKE 1 TABLET BY MOUTH EVERY MORNING (Patient not taking: Reported on  12/27/2014) 30 tablet 5   No current facility-administered medications on file prior to visit.    Allergies  Allergen Reactions  . Cefuroxime Axetil Anaphylaxis    unknown  . Macrodantin Anaphylaxis  . Alendronate Sodium Other (See Comments)    Severe chest pain similar to heart attack   . Codeine Nausea And Vomiting  . Demerol [Meperidine] Other (See Comments)    hallucinate  . Meperidine Hcl Nausea And Vomiting  . Other Other (See Comments)    Hismanal and Maprobamate  portobello mushrooms - diarrhea  . Sulfonamide Derivatives Hives, Itching and Swelling    Family History  Problem Relation Age of Onset  . Bipolar disorder Mother   . Stomach cancer Mother   . Mental illness Mother   . Stroke Father   . Colon cancer Father   . Throat cancer Maternal Uncle     larynx  . HIV Son     Aids  . Anesthesia problems Neg Hx   . Stomach cancer Maternal Grandfather   . Stomach cancer Paternal Grandmother   . Anxiety disorder Mother   . Diabetes Neg Hx   . Heart disease Maternal Grandmother     History   Social History  . Marital Status: Divorced    Spouse Name: N/A  . Number of Children: N/A  . Years of Education: N/A   Social History Main Topics  . Smoking status: Former Smoker -- 0.25 packs/day for 4 years    Types: Cigarettes  . Smokeless tobacco: Never Used     Comment: quit 20+yrs ago  . Alcohol Use: Yes     Comment: WINE WITH DINNER. 2 GLASSES RED WINE  . Drug Use: No  . Sexual Activity: No   Other Topics Concern  . None   Social History Narrative    Review of Systems - See HPI.  All other ROS are negative.  BP 116/57 mmHg  Pulse 58  Temp(Src) 98.3 F (36.8 C) (Oral)  Resp 16  Ht 5' 3"  (1.6 m)  Wt 150 lb 8 oz (68.266 kg)  BMI 26.67 kg/m2  SpO2 98%  Physical Exam  Constitutional: She is well-developed, well-nourished, and in no distress.  HENT:  Head: Normocephalic and atraumatic.  Cardiovascular: Normal rate, regular rhythm, normal heart  sounds and intact distal pulses.   Pulmonary/Chest: Effort normal and breath sounds normal.  Abdominal: Soft. Bowel sounds are normal. There is no tenderness.  Genitourinary: Vulva normal. Thick  fishy  yellow and vaginal discharge found.  Cervix is surgically absent. Mild vaginal atrophy noted.  Skin: Skin is warm and dry. No rash noted.  Psychiatric: Affect  normal.  Vitals reviewed.   Recent Results (from the past 2160 hour(s))  POCT INR     Status: None   Collection Time: 10/06/14  2:09 PM  Result Value Ref Range   INR 1.8   POCT INR     Status: None   Collection Time: 10/20/14  9:33 AM  Result Value Ref Range   INR 5.4   POCT INR     Status: None   Collection Time: 10/30/14  1:03 PM  Result Value Ref Range   INR 2.4   Urinalysis, Routine w reflex microscopic     Status: Abnormal   Collection Time: 11/14/14 10:28 AM  Result Value Ref Range   Color, Urine YELLOW YELLOW   APPearance CLEAR CLEAR   Specific Gravity, Urine 1.025 1.005 - 1.030   pH 6.0 5.0 - 8.0   Glucose, UA NEGATIVE NEGATIVE mg/dL   Hgb urine dipstick TRACE (A) NEGATIVE   Bilirubin Urine NEGATIVE NEGATIVE   Ketones, ur NEGATIVE NEGATIVE mg/dL   Protein, ur NEGATIVE NEGATIVE mg/dL   Urobilinogen, UA 0.2 0.0 - 1.0 mg/dL   Nitrite NEGATIVE NEGATIVE   Leukocytes, UA TRACE (A) NEGATIVE  Urine microscopic-add on     Status: None   Collection Time: 11/14/14 10:28 AM  Result Value Ref Range   Squamous Epithelial / LPF RARE RARE   WBC, UA 0-2 <3 WBC/hpf   RBC / HPF 0-2 <3 RBC/hpf  Surgical pcr screen     Status: Abnormal   Collection Time: 11/14/14 11:16 AM  Result Value Ref Range   MRSA, PCR NEGATIVE NEGATIVE   Staphylococcus aureus POSITIVE (A) NEGATIVE    Comment:        The Xpert SA Assay (FDA approved for NASAL specimens in patients over 8 years of age), is one component of a comprehensive surveillance program.  Test performance has been validated by [facility] for patients greater than or  equal to 43 year old. It is not intended to diagnose infection nor to guide or monitor treatment.   APTT     Status: Abnormal   Collection Time: 11/14/14 11:30 AM  Result Value Ref Range   aPTT 43 (H) 24 - 37 seconds    Comment:        IF BASELINE aPTT IS ELEVATED, SUGGEST PATIENT RISK ASSESSMENT BE USED TO DETERMINE APPROPRIATE ANTICOAGULANT THERAPY.   CBC     Status: None   Collection Time: 11/14/14 11:30 AM  Result Value Ref Range   WBC 6.5 4.0 - 10.5 K/uL   RBC 4.44 3.87 - 5.11 MIL/uL   Hemoglobin 13.3 12.0 - 15.0 g/dL   HCT 41.0 36.0 - 46.0 %   MCV 92.3 78.0 - 100.0 fL   MCH 30.0 26.0 - 34.0 pg   MCHC 32.4 30.0 - 36.0 g/dL   RDW 13.4 11.5 - 15.5 %   Platelets 228 150 - 400 K/uL  Comprehensive metabolic panel     Status: Abnormal   Collection Time: 11/14/14 11:30 AM  Result Value Ref Range   Sodium 141 135 - 145 mmol/L   Potassium 4.3 3.5 - 5.1 mmol/L   Chloride 106 96 - 112 mmol/L   CO2 28 19 - 32 mmol/L   Glucose, Bld 106 (H) 70 - 99 mg/dL   BUN 24 (H) 6 - 23 mg/dL   Creatinine, Ser 0.77 0.50 - 1.10 mg/dL   Calcium 9.6 8.4 - 10.5 mg/dL   Total Protein 6.8 6.0 - 8.3 g/dL  Albumin 4.1 3.5 - 5.2 g/dL   AST 32 0 - 37 U/L   ALT 26 0 - 35 U/L   Alkaline Phosphatase 78 39 - 117 U/L   Total Bilirubin 0.5 0.3 - 1.2 mg/dL   GFR calc non Af Amer 78 (L) >90 mL/min   GFR calc Af Amer >90 >90 mL/min    Comment: (NOTE) The eGFR has been calculated using the CKD EPI equation. This calculation has not been validated in all clinical situations. eGFR's persistently <90 mL/min signify possible Chronic Kidney Disease.    Anion gap 7 5 - 15  Protime-INR     Status: Abnormal   Collection Time: 11/14/14 11:30 AM  Result Value Ref Range   Prothrombin Time 26.3 (H) 11.6 - 15.2 seconds   INR 2.40 (H) 0.00 - 1.49  Type and screen     Status: None   Collection Time: 11/14/14 11:30 AM  Result Value Ref Range   ABO/RH(D) O POS    Antibody Screen NEG    Sample Expiration  11/23/2014   Protime-INR     Status: None   Collection Time: 11/20/14  7:05 AM  Result Value Ref Range   Prothrombin Time 13.6 11.6 - 15.2 seconds   INR 1.03 0.00 - 1.49  CBC     Status: Abnormal   Collection Time: 11/21/14  4:10 AM  Result Value Ref Range   WBC 10.7 (H) 4.0 - 10.5 K/uL   RBC 3.33 (L) 3.87 - 5.11 MIL/uL   Hemoglobin 10.2 (L) 12.0 - 15.0 g/dL   HCT 30.9 (L) 36.0 - 46.0 %   MCV 92.8 78.0 - 100.0 fL   MCH 30.6 26.0 - 34.0 pg   MCHC 33.0 30.0 - 36.0 g/dL   RDW 13.7 11.5 - 15.5 %   Platelets 200 150 - 400 K/uL  Basic metabolic panel     Status: Abnormal   Collection Time: 11/21/14  4:10 AM  Result Value Ref Range   Sodium 136 135 - 145 mmol/L   Potassium 4.3 3.5 - 5.1 mmol/L   Chloride 106 96 - 112 mmol/L   CO2 23 19 - 32 mmol/L   Glucose, Bld 165 (H) 70 - 99 mg/dL   BUN 11 6 - 23 mg/dL   Creatinine, Ser 0.64 0.50 - 1.10 mg/dL   Calcium 8.1 (L) 8.4 - 10.5 mg/dL   GFR calc non Af Amer 83 (L) >90 mL/min   GFR calc Af Amer >90 >90 mL/min    Comment: (NOTE) The eGFR has been calculated using the CKD EPI equation. This calculation has not been validated in all clinical situations. eGFR's persistently <90 mL/min signify possible Chronic Kidney Disease.    Anion gap 7 5 - 15  Protime-INR     Status: None   Collection Time: 11/21/14  4:10 AM  Result Value Ref Range   Prothrombin Time 14.6 11.6 - 15.2 seconds   INR 1.13 0.00 - 1.49  CBC     Status: Abnormal   Collection Time: 11/22/14  5:28 AM  Result Value Ref Range   WBC 13.5 (H) 4.0 - 10.5 K/uL   RBC 3.48 (L) 3.87 - 5.11 MIL/uL   Hemoglobin 10.6 (L) 12.0 - 15.0 g/dL   HCT 32.2 (L) 36.0 - 46.0 %   MCV 92.5 78.0 - 100.0 fL   MCH 30.5 26.0 - 34.0 pg   MCHC 32.9 30.0 - 36.0 g/dL   RDW 14.2 11.5 - 15.5 %   Platelets 187  150 - 400 K/uL  Basic metabolic panel     Status: Abnormal   Collection Time: 11/22/14  5:28 AM  Result Value Ref Range   Sodium 136 135 - 145 mmol/L   Potassium 4.6 3.5 - 5.1 mmol/L    Chloride 107 96 - 112 mmol/L   CO2 22 19 - 32 mmol/L   Glucose, Bld 147 (H) 70 - 99 mg/dL   BUN 17 6 - 23 mg/dL   Creatinine, Ser 0.70 0.50 - 1.10 mg/dL   Calcium 8.7 8.4 - 10.5 mg/dL   GFR calc non Af Amer 81 (L) >90 mL/min   GFR calc Af Amer >90 >90 mL/min    Comment: (NOTE) The eGFR has been calculated using the CKD EPI equation. This calculation has not been validated in all clinical situations. eGFR's persistently <90 mL/min signify possible Chronic Kidney Disease.    Anion gap 7 5 - 15  Protime-INR     Status: Abnormal   Collection Time: 11/22/14  5:28 AM  Result Value Ref Range   Prothrombin Time 15.8 (H) 11.6 - 15.2 seconds   INR 1.25 0.00 - 1.49  POCT urinalysis dipstick     Status: None   Collection Time: 12/27/14  6:09 PM  Result Value Ref Range   Color, UA straw    Clarity, UA clear    Glucose, UA neg    Bilirubin, UA neg    Ketones, UA neg    Spec Grav, UA 1.015    Blood, UA neg    pH, UA 6.0    Protein, UA neg    Urobilinogen, UA 0.2    Nitrite, UA neg    Leukocytes, UA Negative     Assessment/Plan: Bacterial vaginosis Suspect bacterial vaginosis. Wet prep sent for testing. We'll begin MetroGel. Supportive measures discussed. Daily probiotic. We'll alter regimen based on wet prep results.

## 2014-12-28 NOTE — Assessment & Plan Note (Signed)
Suspect bacterial vaginosis. Wet prep sent for testing. We'll begin MetroGel. Supportive measures discussed. Daily probiotic. We'll alter regimen based on wet prep results.

## 2015-01-01 LAB — CERVICOVAGINAL ANCILLARY ONLY: Wet Prep (BD Affirm): NEGATIVE

## 2015-01-02 DIAGNOSIS — M1711 Unilateral primary osteoarthritis, right knee: Secondary | ICD-10-CM | POA: Diagnosis not present

## 2015-01-09 DIAGNOSIS — M1711 Unilateral primary osteoarthritis, right knee: Secondary | ICD-10-CM | POA: Diagnosis not present

## 2015-01-11 ENCOUNTER — Ambulatory Visit (INDEPENDENT_AMBULATORY_CARE_PROVIDER_SITE_OTHER): Payer: Medicare Other | Admitting: *Deleted

## 2015-01-11 DIAGNOSIS — M1711 Unilateral primary osteoarthritis, right knee: Secondary | ICD-10-CM | POA: Diagnosis not present

## 2015-01-11 DIAGNOSIS — I4892 Unspecified atrial flutter: Secondary | ICD-10-CM | POA: Diagnosis not present

## 2015-01-11 LAB — POCT INR: INR: 2.1

## 2015-01-16 DIAGNOSIS — M1711 Unilateral primary osteoarthritis, right knee: Secondary | ICD-10-CM | POA: Diagnosis not present

## 2015-01-18 DIAGNOSIS — M1711 Unilateral primary osteoarthritis, right knee: Secondary | ICD-10-CM | POA: Diagnosis not present

## 2015-01-19 ENCOUNTER — Other Ambulatory Visit: Payer: Self-pay | Admitting: Family Medicine

## 2015-01-19 NOTE — Telephone Encounter (Signed)
Patients last refill was on 12/07/14 for Lunesta She currently has a couple pills left. Advise on refill please.

## 2015-01-20 MED ORDER — ESZOPICLONE 3 MG PO TABS: 3.0000 mg | ORAL_TABLET | Freq: Every evening | ORAL | Status: DC | PRN

## 2015-01-21 ENCOUNTER — Emergency Department (HOSPITAL_COMMUNITY): Payer: Medicare Other

## 2015-01-21 ENCOUNTER — Inpatient Hospital Stay (HOSPITAL_COMMUNITY)
Admission: EM | Admit: 2015-01-21 | Discharge: 2015-01-23 | DRG: 683 | Disposition: A | Discharge status: Home / Self Care | Payer: Medicare Other | Attending: Internal Medicine | Admitting: Internal Medicine

## 2015-01-21 ENCOUNTER — Other Ambulatory Visit: Payer: Self-pay | Admitting: Family Medicine

## 2015-01-21 ENCOUNTER — Encounter (HOSPITAL_COMMUNITY): Payer: Self-pay

## 2015-01-21 DIAGNOSIS — I4892 Unspecified atrial flutter: Secondary | ICD-10-CM | POA: Diagnosis not present

## 2015-01-21 DIAGNOSIS — R5381 Other malaise: Secondary | ICD-10-CM | POA: Diagnosis not present

## 2015-01-21 DIAGNOSIS — M797 Fibromyalgia: Secondary | ICD-10-CM | POA: Diagnosis present

## 2015-01-21 DIAGNOSIS — Z87891 Personal history of nicotine dependence: Secondary | ICD-10-CM

## 2015-01-21 DIAGNOSIS — E785 Hyperlipidemia, unspecified: Secondary | ICD-10-CM | POA: Diagnosis present

## 2015-01-21 DIAGNOSIS — D649 Anemia, unspecified: Secondary | ICD-10-CM | POA: Diagnosis present

## 2015-01-21 DIAGNOSIS — G629 Polyneuropathy, unspecified: Secondary | ICD-10-CM | POA: Diagnosis present

## 2015-01-21 DIAGNOSIS — I484 Atypical atrial flutter: Secondary | ICD-10-CM | POA: Diagnosis present

## 2015-01-21 DIAGNOSIS — N179 Acute kidney failure, unspecified: Secondary | ICD-10-CM | POA: Diagnosis not present

## 2015-01-21 DIAGNOSIS — I10 Essential (primary) hypertension: Secondary | ICD-10-CM | POA: Diagnosis present

## 2015-01-21 DIAGNOSIS — E860 Dehydration: Secondary | ICD-10-CM

## 2015-01-21 DIAGNOSIS — I959 Hypotension, unspecified: Secondary | ICD-10-CM | POA: Diagnosis not present

## 2015-01-21 DIAGNOSIS — M1990 Unspecified osteoarthritis, unspecified site: Secondary | ICD-10-CM | POA: Diagnosis present

## 2015-01-21 DIAGNOSIS — M810 Age-related osteoporosis without current pathological fracture: Secondary | ICD-10-CM | POA: Diagnosis present

## 2015-01-21 DIAGNOSIS — K219 Gastro-esophageal reflux disease without esophagitis: Secondary | ICD-10-CM | POA: Diagnosis present

## 2015-01-21 DIAGNOSIS — Z9842 Cataract extraction status, left eye: Secondary | ICD-10-CM

## 2015-01-21 DIAGNOSIS — I480 Paroxysmal atrial fibrillation: Secondary | ICD-10-CM | POA: Diagnosis present

## 2015-01-21 DIAGNOSIS — Z7901 Long term (current) use of anticoagulants: Secondary | ICD-10-CM | POA: Diagnosis not present

## 2015-01-21 DIAGNOSIS — R000 Tachycardia, unspecified: Secondary | ICD-10-CM | POA: Diagnosis not present

## 2015-01-21 DIAGNOSIS — Z96651 Presence of right artificial knee joint: Secondary | ICD-10-CM | POA: Diagnosis present

## 2015-01-21 DIAGNOSIS — Z8679 Personal history of other diseases of the circulatory system: Secondary | ICD-10-CM

## 2015-01-21 DIAGNOSIS — E876 Hypokalemia: Secondary | ICD-10-CM | POA: Diagnosis present

## 2015-01-21 DIAGNOSIS — Z981 Arthrodesis status: Secondary | ICD-10-CM

## 2015-01-21 DIAGNOSIS — F419 Anxiety disorder, unspecified: Secondary | ICD-10-CM | POA: Diagnosis present

## 2015-01-21 DIAGNOSIS — R002 Palpitations: Secondary | ICD-10-CM

## 2015-01-21 DIAGNOSIS — Z79899 Other long term (current) drug therapy: Secondary | ICD-10-CM | POA: Diagnosis not present

## 2015-01-21 DIAGNOSIS — R5383 Other fatigue: Secondary | ICD-10-CM | POA: Diagnosis not present

## 2015-01-21 DIAGNOSIS — Z9841 Cataract extraction status, right eye: Secondary | ICD-10-CM

## 2015-01-21 LAB — CBC
HCT: 42.6 % (ref 36.0–46.0)
Hemoglobin: 14.3 g/dL (ref 12.0–15.0)
MCH: 29.9 pg (ref 26.0–34.0)
MCHC: 33.6 g/dL (ref 30.0–36.0)
MCV: 88.9 fL (ref 78.0–100.0)
Platelets: 302 10*3/uL (ref 150–400)
RBC: 4.79 MIL/uL (ref 3.87–5.11)
RDW: 14.9 % (ref 11.5–15.5)
WBC: 11.1 10*3/uL — ABNORMAL HIGH (ref 4.0–10.5)

## 2015-01-21 LAB — COMPREHENSIVE METABOLIC PANEL
ALT: 14 U/L (ref 14–54)
AST: 16 U/L (ref 15–41)
Albumin: 3 g/dL — ABNORMAL LOW (ref 3.5–5.0)
Alkaline Phosphatase: 69 U/L (ref 38–126)
Anion gap: 8 (ref 5–15)
BUN: 15 mg/dL (ref 6–20)
CO2: 23 mmol/L (ref 22–32)
Calcium: 8.2 mg/dL — ABNORMAL LOW (ref 8.9–10.3)
Chloride: 107 mmol/L (ref 101–111)
Creatinine, Ser: 1.14 mg/dL — ABNORMAL HIGH (ref 0.44–1.00)
GFR calc Af Amer: 52 mL/min — ABNORMAL LOW (ref >60)
GFR calc non Af Amer: 45 mL/min — ABNORMAL LOW (ref >60)
Glucose, Bld: 105 mg/dL — ABNORMAL HIGH (ref 70–99)
Potassium: 4 mmol/L (ref 3.5–5.1)
Sodium: 138 mmol/L (ref 135–145)
Total Bilirubin: 0.5 mg/dL (ref 0.3–1.2)
Total Protein: 5.3 g/dL — ABNORMAL LOW (ref 6.5–8.1)

## 2015-01-21 LAB — BASIC METABOLIC PANEL
Anion gap: 9 (ref 5–15)
BUN: 14 mg/dL (ref 6–20)
CO2: 25 mmol/L (ref 22–32)
Calcium: 9.3 mg/dL (ref 8.9–10.3)
Chloride: 104 mmol/L (ref 101–111)
Creatinine, Ser: 1.63 mg/dL — ABNORMAL HIGH (ref 0.44–1.00)
GFR calc Af Amer: 34 mL/min — ABNORMAL LOW (ref >60)
GFR calc non Af Amer: 29 mL/min — ABNORMAL LOW (ref >60)
Glucose, Bld: 129 mg/dL — ABNORMAL HIGH (ref 70–99)
Potassium: 3.9 mmol/L (ref 3.5–5.1)
Sodium: 138 mmol/L (ref 135–145)

## 2015-01-21 LAB — I-STAT TROPONIN, ED: Troponin i, poc: 0.02 ng/mL (ref 0.00–0.08)

## 2015-01-21 LAB — MAGNESIUM: Magnesium: 1.7 mg/dL (ref 1.7–2.4)

## 2015-01-21 LAB — LACTIC ACID, PLASMA: Lactic Acid, Venous: 1 mmol/L (ref 0.5–2.0)

## 2015-01-21 LAB — PROTIME-INR
INR: 1.99 — ABNORMAL HIGH (ref 0.00–1.49)
Prothrombin Time: 22.8 seconds — ABNORMAL HIGH (ref 11.6–15.2)

## 2015-01-21 MED ORDER — SODIUM CHLORIDE 0.9 % IV SOLN
INTRAVENOUS | Status: DC
  Administered 2015-01-22 (×2): via INTRAVENOUS

## 2015-01-21 MED ORDER — GABAPENTIN 600 MG PO TABS
600.0000 mg | ORAL_TABLET | Freq: Three times a day (TID) | ORAL | Status: DC
  Administered 2015-01-21 – 2015-01-23 (×5): 600 mg via ORAL
  Filled 2015-01-21 (×7): qty 1

## 2015-01-21 MED ORDER — SODIUM CHLORIDE 0.9 % IV BOLUS (SEPSIS): 1000.0000 mL | Freq: Once | INTRAVENOUS | Status: DC

## 2015-01-21 MED ORDER — ONDANSETRON HCL 4 MG/2ML IJ SOLN
4.0000 mg | Freq: Four times a day (QID) | INTRAMUSCULAR | Status: DC | PRN
Start: 2015-01-21 — End: 2015-01-23

## 2015-01-21 MED ORDER — HYDROCODONE-ACETAMINOPHEN 5-325 MG PO TABS: 1.0000 | ORAL_TABLET | Freq: Four times a day (QID) | ORAL | Status: DC | PRN

## 2015-01-21 MED ORDER — SODIUM CHLORIDE 0.9 % IV BOLUS (SEPSIS)
500.0000 mL | Freq: Once | INTRAVENOUS | Status: AC
  Administered 2015-01-21: 500 mL via INTRAVENOUS

## 2015-01-21 MED ORDER — SODIUM CHLORIDE 0.9 % IV BOLUS (SEPSIS)
1000.0000 mL | Freq: Once | INTRAVENOUS | Status: AC
  Administered 2015-01-21: 1000 mL via INTRAVENOUS

## 2015-01-21 MED ORDER — ACETAMINOPHEN 325 MG PO TABS: 650.0000 mg | ORAL_TABLET | Freq: Four times a day (QID) | ORAL | Status: DC | PRN

## 2015-01-21 MED ORDER — ACETAMINOPHEN 650 MG RE SUPP: 650.0000 mg | Freq: Four times a day (QID) | RECTAL | Status: DC | PRN

## 2015-01-21 MED ORDER — SODIUM CHLORIDE 0.9 % IJ SOLN
3.0000 mL | Freq: Two times a day (BID) | INTRAMUSCULAR | Status: DC
  Administered 2015-01-21 – 2015-01-23 (×4): 3 mL via INTRAVENOUS

## 2015-01-21 MED ORDER — FAMOTIDINE 20 MG PO TABS
20.0000 mg | ORAL_TABLET | Freq: Every day | ORAL | Status: DC
  Administered 2015-01-21 – 2015-01-22 (×2): 20 mg via ORAL
  Filled 2015-01-21 (×3): qty 1

## 2015-01-21 MED ORDER — CITALOPRAM HYDROBROMIDE 10 MG PO TABS
10.0000 mg | ORAL_TABLET | Freq: Every morning | ORAL | Status: DC
  Administered 2015-01-22 – 2015-01-23 (×2): 10 mg via ORAL
  Filled 2015-01-21 (×2): qty 1

## 2015-01-21 MED ORDER — PANTOPRAZOLE SODIUM 40 MG PO TBEC
40.0000 mg | DELAYED_RELEASE_TABLET | Freq: Every day | ORAL | Status: DC
  Administered 2015-01-22 – 2015-01-23 (×2): 40 mg via ORAL
  Filled 2015-01-21 (×2): qty 1

## 2015-01-21 MED ORDER — ONDANSETRON HCL 4 MG PO TABS: 4.0000 mg | ORAL_TABLET | Freq: Four times a day (QID) | ORAL | Status: DC | PRN

## 2015-01-21 MED ORDER — SIMVASTATIN 40 MG PO TABS
40.0000 mg | ORAL_TABLET | Freq: Every evening | ORAL | Status: DC
  Administered 2015-01-21: 40 mg via ORAL
  Filled 2015-01-21 (×2): qty 1

## 2015-01-21 MED ORDER — NORTRIPTYLINE HCL 25 MG PO CAPS
50.0000 mg | ORAL_CAPSULE | Freq: Every day | ORAL | Status: DC
  Administered 2015-01-21 – 2015-01-22 (×2): 50 mg via ORAL
  Filled 2015-01-21 (×3): qty 2

## 2015-01-21 MED ORDER — EZETIMIBE 10 MG PO TABS
10.0000 mg | ORAL_TABLET | Freq: Every day | ORAL | Status: DC
  Administered 2015-01-22 – 2015-01-23 (×3): 10 mg via ORAL
  Filled 2015-01-21 (×3): qty 1

## 2015-01-21 MED ORDER — PANTOPRAZOLE SODIUM 40 MG PO TBEC: 40.0000 mg | DELAYED_RELEASE_TABLET | Freq: Every day | ORAL | Status: DC

## 2015-01-21 NOTE — ED Provider Notes (Signed)
CSN: [id number]     Arrival date & time 01/21/15  1609 History   First MD Initiated Contact with Patient 01/21/15 1622     Chief Complaint  Patient presents with  . Tachycardia    (Consider location/radiation/quality/duration/timing/severity/associated sxs/prior Treatment) Patient is a 81 y.o. female presenting with palpitations. The history is provided by the patient. No language interpreter was used.  Palpitations Palpitations quality:  Fast Onset quality:  Sudden Timing:  Constant Progression:  Waxing and waning Chronicity:  New Context: not anxiety, not blood loss, not caffeine, not dehydration, not illicit drugs, not nicotine and not stimulant use   Relieved by:  Nothing Worsened by:  Nothing Ineffective treatments:  None tried Associated symptoms: malaise/fatigue   Associated symptoms: no back pain, no chest pain, no chest pressure, no cough, no diaphoresis, no leg pain, no nausea, no numbness, no orthopnea, no shortness of breath, no syncope, no vomiting and no weakness   Risk factors: hx of atrial fibrillation   Risk factors: no diabetes mellitus, no hx of PE, no hx of thyroid disease, no hypercoagulable state, no hyperthyroidism and no OTC sinus medications     Past Medical History  Diagnosis Date  . Hyperlipidemia     takes Zetia and Simvastatin nightly  . Palpitation   . Carotid bruit     Right  . Edema   . Arthritis   . Allergy     portabella mushrooms/diarrhea  . Heart murmur   . Osteoporosis   . Fibromyalgia   . Thrush 03/23/2012  . Vaginitis 03/23/2012  . Sigmoid colon ulcer 03/23/2012  . Hyperglycemia 04/27/2012  . History of migraine     last about 31yrs ago  . Foot drop, right   . Joint pain   . Joint swelling   . Chronic back pain   . Skin problem in pregnancy     takes gabapentin 3 times a day and also has an ointment  . GERD (gastroesophageal reflux disease)     takes Zantac and Nexium daily  . Hemorrhoids   . Diverticulosis   . IBS (irritable bowel  syndrome)     takes align daily  . Hypocalcemia 03/29/2013  . Depression with anxiety 03/29/2013    hx of  . Tachycardia 07/03/2013  . Onychomycosis 07/03/2013  . Adenomatous polyp of colon 1991, 2008  . Medicare annual wellness visit, subsequent 08/13/2014  . Dysrhythmia     paroxysmal A fib, irregular heartbeat;takes Bisoprolol nightly  . Migraine 04/27/2012    last 40 years ago  . Falls frequently    Past Surgical History  Procedure Laterality Date  . Abdominal hysterectomy  1981  . Lumbar laminectomy  1960    x 2  . Veins stripped  1970  . Total hip arthroplasty Left 2012  . Carpal tunnel release Left 2012    ulnar nerve release at elbow  . Cervical fusion  2003, 2005, 2013  . Tonsillectomy  1943  . Left elbow surgery  06/03/11    cyst removed  . Colonoscopy    . Posterior cervical fusion/foraminotomy  08/03/2012    Procedure: POSTERIOR CERVICAL FUSION/FORAMINOTOMY LEVEL 5;  Surgeon: [name], MD;  Location: [facility];  Service: Neurosurgery;  Laterality: N/A;  Cervical four to Thoracic two Posterior cervical decompression with Facet and Pedicle screw fixation  . Back surgery  1997    reconstructive lower back surgery  . Wrist surgery Right 1990's    cyst removed  . Eye surgery  2015  b/l cataracts removed  . Total knee arthroplasty Right 11/20/2014    Procedure: RIGHT TOTAL KNEE ARTHROPLASTY;  Surgeon: [name], MD;  Location: [facility];  Service: Orthopedics;  Laterality: Right;   Family History  Problem Relation Age of Onset  . Bipolar disorder Mother   . Stomach cancer Mother   . Mental illness Mother   . Stroke Father   . Colon cancer Father   . Throat cancer Maternal Uncle     larynx  . HIV Son     Aids  . Anesthesia problems Neg Hx   . Stomach cancer Maternal Grandfather   . Stomach cancer Paternal Grandmother   . Anxiety disorder Mother   . Diabetes Neg Hx   . Heart disease Maternal Grandmother    History  Substance Use Topics  . Smoking  status: Former Smoker -- 0.25 packs/day for 4 years    Types: Cigarettes  . Smokeless tobacco: Never Used     Comment: quit 20+yrs ago  . Alcohol Use: Yes     Comment: WINE WITH DINNER. 2 GLASSES RED WINE   OB History    No data available     Review of Systems  Constitutional: Positive for malaise/fatigue and fatigue. Negative for diaphoresis.  Respiratory: Negative for cough, chest tightness, shortness of breath and wheezing.   Cardiovascular: Positive for palpitations. Negative for chest pain, orthopnea and syncope.  Gastrointestinal: Negative for nausea, vomiting, abdominal pain and constipation.  Genitourinary: Negative for flank pain.  Musculoskeletal: Positive for gait problem (right knee pain 2/2 R TKA). Negative for back pain.  Neurological: Positive for light-headedness. Negative for weakness, numbness and headaches.  Psychiatric/Behavioral: Negative for confusion.  All other systems reviewed and are negative.   Allergies  Cefuroxime axetil; Macrodantin; Alendronate sodium; Codeine; Demerol; Meperidine hcl; Other; and Sulfonamide derivatives  Home Medications   Prior to Admission medications   Medication Sig Start Date End Date Taking? Authorizing Provider  bisoprolol (ZEBETA) 5 MG tablet TAKE 1 TABLET BY MOUTH TWICE DAILY 10/17/14   Mosie Lukes, MD  CARTIA XT 120 MG 24 hr capsule Take 120 mg by mouth every morning.  05/30/14   Historical Provider, MD  citalopram (CELEXA) 10 MG tablet TAKE 1 TABLET BY MOUTH EVERY DAY Patient taking differently: Take one tablet by mouth every morning. 09/19/14   Mosie Lukes, MD  clobetasol ointment (TEMOVATE) 2.69 % Apply 1 application topically 2 (two) times daily as needed (for itching).    Historical Provider, MD  Eszopiclone 3 MG TABS Take 1 tablet (3 mg total) by mouth at bedtime as needed. Take immediately before bedtime 01/20/15   Mosie Lukes, MD  gabapentin (NEURONTIN) 600 MG tablet TAKE 1 TABLET BY MOUTH THREE TIMES DAILY  09/11/14   Mosie Lukes, MD  hydrOXYzine (ATARAX/VISTARIL) 10 MG tablet TAKE 1 TABLET BY MOUTH EVERY 6 HOURS AS NEEDED FOR ITCHING OR ANXIETY Patient not taking: Reported on 12/27/2014 10/05/14   Mosie Lukes, MD  loperamide (IMODIUM) 2 MG capsule Take 2 mg by mouth as needed for diarrhea or loose stools.    Historical Provider, MD  metroNIDAZOLE (METROGEL) 0.75 % gel Insert one applicatorful (5g) of medicine into the vagina once nightly x 5 days 12/27/14   Brunetta Jeans, PA-C  NEXIUM 40 MG capsule TAKE ONE CAPSULE BY MOUTH EVERY DAY AT 2:00 PM Patient taking differently: Take one capsule by mouth nightly at bedtime. 09/11/14   Mosie Lukes, MD  nortriptyline [facility])  25 MG capsule TAKE 2 CAPSULES BY MOUTH EVERY NIGHT AT BEDTIME 10/23/14   Mosie Lukes, MD  oxyCODONE (OXY IR/ROXICODONE) 5 MG immediate release tablet Take 1-2 tablets (5-10 mg total) by mouth every 3 (three) hours as needed for moderate pain, severe pain or breakthrough pain. 11/22/14   Arlee Muslim, PA-C  ranitidine (ZANTAC) 300 MG tablet TAKE 1 TABLET BY MOUTH AT BEDTIME 10/23/14   Mosie Lukes, MD  simvastatin (ZOCOR) 40 MG tablet TAKE 1 TABLET BY MOUTH EVERY EVENING 09/11/14   Mosie Lukes, MD  torsemide (DEMADEX) 10 MG tablet TAKE 1 TABLET BY MOUTH EVERY MORNING Patient not taking: Reported on 12/27/2014 10/05/14   Mosie Lukes, MD  warfarin (COUMADIN) 5 MG tablet Take 1 tablet (5 mg total) by mouth daily. Take Coumadin for three weeks for postoperative protocol and then the patient may resume their previous Coumadin home regimen.  The dose may need to be adjusted based upon the INR.  Please follow the INR and titrate Coumadin dose for a therapeutic range between 2.0 and 3.0 INR.  After completing the three weeks of Coumadin, the patient may resume their previous Coumadin home regimen. 11/22/14   Arlee Muslim, PA-C  warfarin (COUMADIN) 5 MG tablet Take 1 tablet (5 mg total) by mouth one time only at 6 PM. 11/22/14   Gaynelle Arabian,  MD  ZETIA 10 MG tablet TAKE 1 TABLET BY MOUTH EVERY DAY Patient taking differently: Take one tablet by mouth nightly at bedtime. 09/29/14   Mosie Lukes, MD   BP 69/40 mmHg  Pulse 64  Temp(Src) 97.5 F (36.4 C) (Oral)  Resp 20  Ht 5\' 4"  (1.626 m)  SpO2 100%   Filed Vitals:   01/21/15 2015 01/21/15 2030 01/21/15 2131 01/21/15 2140  BP: 107/47 108/48 107/52 109/48  Pulse: 52 51 55 55  Temp:   97.8 F (36.6 C)   TempSrc:   Oral   Resp: 17 18 18    Height:   5\' 4"  (1.626 m)   Weight:   147 lb 3.2 oz (66.769 kg)   SpO2: 93% 93% 98%    Physical Exam  Constitutional: She is oriented to person, place, and time. She appears well-developed and well-nourished. She is cooperative. She does not appear ill. No distress.  HENT:  Head: Normocephalic and atraumatic.  Nose: Nose normal.  Mouth/Throat: Oropharynx is clear and moist. No oropharyngeal exudate.  Eyes: EOM are normal. Pupils are equal, round, and reactive to light.  Neck: Normal range of motion. Neck supple.  Cardiovascular: Normal rate, regular rhythm, normal heart sounds and intact distal pulses.   No murmur heard. Pulmonary/Chest: Effort normal and breath sounds normal. No respiratory distress. She has no wheezes. She exhibits no tenderness.  Abdominal: Soft. There is no tenderness. There is no rebound and no guarding.  Musculoskeletal: Normal range of motion. She exhibits no tenderness.  Right knee with well healed surgical scar, full ROM, no signs of infection   Lymphadenopathy:    She has no cervical adenopathy.  Neurological: She is alert and oriented to person, place, and time. No cranial nerve deficit. Coordination normal.  Skin: Skin is warm and dry. She is not diaphoretic.  Psychiatric: She has a normal mood and affect. Her behavior is normal. Judgment and thought content normal.  Nursing note and vitals reviewed.   ED Course  Procedures (including critical care time) Labs Review Labs Reviewed  CBC - Abnormal;  Notable for the following:  WBC 11.1 (*)    All other components within normal limits  BASIC METABOLIC PANEL - Abnormal; Notable for the following:    Glucose, Bld 129 (*)    Creatinine, Ser 1.63 (*)    GFR calc non Af Amer 29 (*)    GFR calc Af Amer 34 (*)    All other components within normal limits  PROTIME-INR - Abnormal; Notable for the following:    Prothrombin Time 22.8 (*)    INR 1.99 (*)    All other components within normal limits  COMPREHENSIVE METABOLIC PANEL - Abnormal; Notable for the following:    Glucose, Bld 105 (*)    Creatinine, Ser 1.14 (*)    Calcium 8.2 (*)    Total Protein 5.3 (*)    Albumin 3.0 (*)    GFR calc non Af Amer 45 (*)    GFR calc Af Amer 52 (*)    All other components within normal limits  LACTIC ACID, PLASMA  MAGNESIUM  URINALYSIS, ROUTINE W REFLEX MICROSCOPIC  PROTIME-INR  I-STAT TROPOININ, ED    Imaging Review Dg Chest Port 1 View  01/21/2015   CLINICAL DATA:  Tachycardia today  EXAM: PORTABLE CHEST - 1 VIEW  COMPARISON:  11/22/2014  FINDINGS: Heart size and vascular pattern are normal. Lungs are clear. No effusions.  IMPRESSION: No active disease.   Electronically Signed   By: Skipper Cliche M.D.   On: 01/21/2015 17:04    EKG Interpretation   Date/Time:  Sunday Jan 21 2015 16:15:55 EDT Ventricular Rate:  61 PR Interval:  202 QRS Duration: 86 QT Interval:  434 QTC Calculation: 436 R Axis:   -23 Text Interpretation:  Normal sinus rhythm Septal infarct , age  undetermined Abnormal ECG No significant change was found Confirmed by  CAMPOS  MD, Lennette Bihari ([id number]) on 01/21/2015 4:57:31 PM      MDM   Final diagnoses:  Palpitations  History of atrial fibrillation   Pt is a 81 yo F with hx of pAfib (on coumadin), HLD, and IBS who presents with palpitations. Reports she woke this AM at 0400 with symptomatic palpitations, reports palpating her pulse and "felt like it was racing".   Felt generalized fatigue and lightheadedness all day.  The  palpitations persisted, so she presented to the ED.  Reports mild nausea and decreased PO intake yesterday but no vomiting or diarrhea.  No known blood loss including GI or GU.   Recent right TKA on 11/20/14, still on occasional narcotics, taking them q 4-6 hours.   Well healed right knee from recent TKA.  Denies chest pain, SOB, abd pain.  No recent med changes.  BP 69/40 in triage but up to 29U systolic when bedded.  Pulse rate in the 60s and sinus.  GCS 15 and appears well. No chest tenderness.  Abd soft/nontender.  Reports she still feels like she is having the rapid palpitations but has rates in 60-70s now.  Unclear if her sensation is more lightheadedness from the soft BP vs real palpitations.    Will give NS bolus while evaluating with labs, EKG, and CXR.  May have been in Afib then spontaneously converted on arrival.  May be sinus tachycardia and hypotension 2/2 dehydration.   BP dropped in the 76L systolic again so given 2nd L NS.  Labs show dehydration with Cr 1.6, baseline around 0.9.  EKG with NSR, no signs of arrhythmia or ischemia.  Trop negative.   Spoke to Dr. Posey Pronto with hospitalist at  1930.  Will give patient a 3rd L of NS to see if patient's BP will improve some so she can go to tele instead of step down unit.  Will hold on admission orders until the additional fluid.   BP improved to the upper 35K/KXF 818E systolic.  Stable for telemetry unit.   Patient was seen with ED Attending, Dr. Driscilla Moats, MD  Tori Milks, MD 01/22/15 [place], MD 01/22/15 0040

## 2015-01-21 NOTE — Progress Notes (Signed)
[facility] for Coumadin Indication: Aflutter  Allergies  Allergen Reactions  . Cefuroxime Axetil Anaphylaxis  . Macrodantin Anaphylaxis  . Alendronate Sodium Other (See Comments)    Severe chest pain similar to heart attack   . Codeine Nausea And Vomiting  . Demerol [Meperidine] Other (See Comments)    hallucinations  . Meperidine Hcl Nausea And Vomiting  . Other Other (See Comments)    Hismanal and Maprobamate  portobello mushrooms - diarrhea  . Sulfonamide Derivatives Hives, Itching and Swelling    Patient Measurements: Height: 5\' 4"  (162.6 cm) Weight: 147 lb 3.2 oz (66.769 kg) (c scale) IBW/kg (Calculated) : 54.7  Vital Signs: Temp: 97.8 F (36.6 C) (05/01 2131) Temp Source: Oral (05/01 2131) BP: 109/48 mmHg (05/01 2140) Pulse Rate: 55 (05/01 2140)  Labs:  Recent Labs  01/21/15 1636 01/21/15 2036  HGB 14.3  --   HCT 42.6  --   PLT 302  --   LABPROT 22.8*  --   INR 1.99*  --   CREATININE 1.63* 1.14*    Estimated Creatinine Clearance: 38.2 mL/min (by C-G formula based on Cr of 1.14).   Medical History: Past Medical History  Diagnosis Date  . Hyperlipidemia     takes Zetia and Simvastatin nightly  . Palpitation   . Carotid bruit     Right  . Edema   . Arthritis   . Allergy     portabella mushrooms/diarrhea  . Heart murmur   . Osteoporosis   . Fibromyalgia   . Thrush 03/23/2012  . Vaginitis 03/23/2012  . Sigmoid colon ulcer 03/23/2012  . Hyperglycemia 04/27/2012  . History of migraine     last about 48yrs ago  . Foot drop, right   . Joint pain   . Joint swelling   . Chronic back pain   . Skin problem in pregnancy     takes gabapentin 3 times a day and also has an ointment  . GERD (gastroesophageal reflux disease)     takes Zantac and Nexium daily  . Hemorrhoids   . Diverticulosis   . IBS (irritable bowel syndrome)     takes align daily  . Hypocalcemia 03/29/2013  . Depression with anxiety 03/29/2013    hx of   . Tachycardia 07/03/2013  . Onychomycosis 07/03/2013  . Adenomatous polyp of colon 1991, 2008  . Medicare annual wellness visit, subsequent 08/13/2014  . Dysrhythmia     paroxysmal A fib, irregular heartbeat;takes Bisoprolol nightly  . Migraine 04/27/2012    last 40 years ago  . Falls frequently     Medications:  Prescriptions prior to admission  Medication Sig Dispense Refill Last Dose  . Biotin 5000 MCG TABS Take 5,000 mcg by mouth daily.   01/21/2015 at Unknown time  . bisoprolol (ZEBETA) 5 MG tablet TAKE 1 TABLET BY MOUTH TWICE DAILY 60 tablet 2 01/21/2015 at 800  . Calcium Carb-Cholecalciferol 600-200 MG-UNIT TABS Take 1 tablet by mouth daily.   01/21/2015 at Unknown time  . CARTIA XT 120 MG 24 hr capsule Take 120 mg by mouth every morning.    01/21/2015 at Unknown time  . citalopram (CELEXA) 10 MG tablet TAKE 1 TABLET BY MOUTH EVERY DAY (Patient taking differently: Take one tablet by mouth every morning.) 30 tablet 5 01/21/2015 at Unknown time  . clobetasol ointment (TEMOVATE) 8.34 % Apply 1 application topically 2 (two) times daily as needed (for itching).   over 30 days  . Eszopiclone 3 MG TABS  Take 1 tablet (3 mg total) by mouth at bedtime as needed. Take immediately before bedtime (Patient taking differently: Take 3 mg by mouth at bedtime. Take immediately before bedtime) 30 tablet 1 01/20/2015 at Unknown time  . gabapentin (NEURONTIN) 600 MG tablet TAKE 1 TABLET BY MOUTH THREE TIMES DAILY 90 tablet 4 01/21/2015 at Unknown time  . HYDROcodone-acetaminophen (NORCO/VICODIN) 5-325 MG per tablet Take 1 tablet by mouth every 6 (six) hours as needed for moderate pain or severe pain.   0 01/21/2015 at Unknown time  . hydrOXYzine (ATARAX/VISTARIL) 10 MG tablet TAKE 1 TABLET BY MOUTH EVERY 6 HOURS AS NEEDED FOR ITCHING OR ANXIETY (Patient taking differently: TAKE 1 TABLET BY MOUTH DAILY) 60 tablet 2 01/21/2015 at Unknown time  . MEGARED OMEGA-3 KRILL OIL 500 MG CAPS Take 1 tablet by mouth daily.   01/21/2015  at Unknown time  . Multiple Vitamins-Minerals (CENTRUM SILVER ULTRA WOMENS) TABS Take 1 tablet by mouth daily.   01/21/2015 at Unknown time  . NEXIUM 40 MG capsule TAKE ONE CAPSULE BY MOUTH EVERY DAY AT 2:00 PM (Patient taking differently: TAKE ONE CAPSULE BY MOUTH EVERY DAY AT BEDTIME) 30 capsule 4 01/21/2015 at Unknown time  . nortriptyline (PAMELOR) 25 MG capsule TAKE 2 CAPSULES BY MOUTH EVERY NIGHT AT BEDTIME 60 capsule 2 01/20/2015 at Unknown time  . ranitidine (ZANTAC) 300 MG tablet TAKE 1 TABLET BY MOUTH AT BEDTIME 30 tablet 5 01/20/2015 at Unknown time  . simvastatin (ZOCOR) 40 MG tablet TAKE 1 TABLET BY MOUTH EVERY EVENING 30 tablet 4 01/20/2015 at Unknown time  . warfarin (COUMADIN) 5 MG tablet Take 1 tablet (5 mg total) by mouth one time only at 6 PM. (Patient taking differently: Take 5-7.5 mg by mouth one time only at 6 PM. Take 5 mg daily in the evening except on Sunday and Thursday. Take 7.5 mg on Sunday and Thursday.) 30 tablet 0 01/20/2015 at Unknown time  . ZETIA 10 MG tablet TAKE 1 TABLET BY MOUTH EVERY DAY (Patient taking differently: Take one tablet by mouth nightly at bedtime.) 30 tablet 5 01/20/2015 at Unknown time  . metroNIDAZOLE (METROGEL) 0.75 % gel Insert one applicatorful (5g) of medicine into the vagina once nightly x 5 days (Patient not taking: Reported on 01/21/2015) 25 g 0 Not Taking at Unknown time  . oxyCODONE (OXY IR/ROXICODONE) 5 MG immediate release tablet Take 1-2 tablets (5-10 mg total) by mouth every 3 (three) hours as needed for moderate pain, severe pain or breakthrough pain. (Patient not taking: Reported on 01/21/2015) 90 tablet 0 Not Taking at Unknown time  . torsemide (DEMADEX) 10 MG tablet TAKE 1 TABLET BY MOUTH EVERY MORNING (Patient not taking: Reported on 12/27/2014) 30 tablet 5 Not Taking at Unknown time  . warfarin (COUMADIN) 5 MG tablet Take 1 tablet (5 mg total) by mouth daily. Take Coumadin for three weeks for postoperative protocol and then the patient may resume  their previous Coumadin home regimen.  The dose may need to be adjusted based upon the INR.  Please follow the INR and titrate Coumadin dose for a therapeutic range between 2.0 and 3.0 INR.  After completing the three weeks of Coumadin, the patient may resume their previous Coumadin home regimen. (Patient not taking: Reported on 01/21/2015) 45 tablet 3 Not Taking at Unknown time   Assessment: 81 y.o. female admitted with dehydration/hypotension, h/o Aflutter, to continue Coumadin  Goal of Therapy:  INR 2-3 Monitor platelets by anticoagulation protocol: Yes   Plan:  Coumadin 7.5 mg now Daily INR  Adell Panek, Bronson Curb 01/21/2015,11:52 PM

## 2015-01-21 NOTE — ED Notes (Signed)
Pt here for fast heartrate for the past 14 hours pt feels like she might pass out. Didn't come sooner because she was hoping it might go away. Pt denies any cp or SOB with it.

## 2015-01-21 NOTE — H&P (Signed)
[facility] History and Physical  Patient: Chelsea Hancock  MRN: [id number]  DOB: [birth year]  DOS: the patient was seen and examined on 01/21/2015 PCP: Penni Homans, MD  Referring physician: Dr. Kai Levins. Venora Maples Chief Complaint: Palpitation  HPI: Chelsea Hancock is a 81 y.o. female with Past medical history of dyslipidemia, hypertension, anxiety, aortic flutter, right leg neuropathy, GERD. The patient is presenting with complaints of generalized weakness and fatigue ongoing for last 2 days and palpitation ongoing earlier in the morning of 01/21/2015. Patient mentions that when she woke up this morning she felt that her heart was racing too fast. She checked her pulse and it was running faster. She is unsure whether this was irregular or regular. She did have some dizziness and lightheadedness with that. She denies any vertigo, blurred vision, focal deficit. The patient mentions that she does not have any complaints of fever, chills, vomiting, diarrhea, constipation, active bleeding, burning urination. She has been feeling weak and lethargic and has not had significant appetite for last 2 days. She has chronic postnasal drip but no recent cough or no recent shortness of breath on exertion or at rest. With this she remained compliant with all her medications.  The patient is coming from home. And at her baseline independent for most of her ADL.  Review of Systems: as mentioned in the history of present illness.  A comprehensive review of the other systems is negative.  Past Medical History  Diagnosis Date  . Hyperlipidemia     takes Zetia and Simvastatin nightly  . Palpitation   . Carotid bruit     Right  . Edema   . Arthritis   . Allergy     portabella mushrooms/diarrhea  . Heart murmur   . Osteoporosis   . Fibromyalgia   . Thrush 03/23/2012  . Vaginitis 03/23/2012  . Sigmoid colon ulcer 03/23/2012  . Hyperglycemia 04/27/2012  . History of migraine     last  about 29yrs ago  . Foot drop, right   . Joint pain   . Joint swelling   . Chronic back pain   . Skin problem in pregnancy     takes gabapentin 3 times a day and also has an ointment  . GERD (gastroesophageal reflux disease)     takes Zantac and Nexium daily  . Hemorrhoids   . Diverticulosis   . IBS (irritable bowel syndrome)     takes align daily  . Hypocalcemia 03/29/2013  . Depression with anxiety 03/29/2013    hx of  . Tachycardia 07/03/2013  . Onychomycosis 07/03/2013  . Adenomatous polyp of colon 1991, 2008  . Medicare annual wellness visit, subsequent 08/13/2014  . Dysrhythmia     paroxysmal A fib, irregular heartbeat;takes Bisoprolol nightly  . Migraine 04/27/2012    last 40 years ago  . Falls frequently    Past Surgical History  Procedure Laterality Date  . Abdominal hysterectomy  1981  . Lumbar laminectomy  1960    x 2  . Veins stripped  1970  . Total hip arthroplasty Left 2012  . Carpal tunnel release Left 2012    ulnar nerve release at elbow  . Cervical fusion  2003, 2005, 2013  . Tonsillectomy  1943  . Left elbow surgery  06/03/11    cyst removed  . Colonoscopy    . Posterior cervical fusion/foraminotomy  08/03/2012    Procedure: POSTERIOR CERVICAL FUSION/FORAMINOTOMY LEVEL 5;  Surgeon: [name], MD;  Location: [facility];  Service:  Neurosurgery;  Laterality: N/A;  Cervical four to Thoracic two Posterior cervical decompression with Facet and Pedicle screw fixation  . Back surgery  1997    reconstructive lower back surgery  . Wrist surgery Right 1990's    cyst removed  . Eye surgery  2015    b/l cataracts removed  . Total knee arthroplasty Right 11/20/2014    Procedure: RIGHT TOTAL KNEE ARTHROPLASTY;  Surgeon: [name], MD;  Location: [facility];  Service: Orthopedics;  Laterality: Right;   Social History:  reports that she has quit smoking. Her smoking use included Cigarettes. She has a 1 pack-year smoking history. She has never used smokeless tobacco.  She reports that she drinks alcohol. She reports that she does not use illicit drugs.  Allergies  Allergen Reactions  . Cefuroxime Axetil Anaphylaxis  . Macrodantin Anaphylaxis  . Alendronate Sodium Other (See Comments)    Severe chest pain similar to heart attack   . Codeine Nausea And Vomiting  . Demerol [Meperidine] Other (See Comments)    hallucinations  . Meperidine Hcl Nausea And Vomiting  . Other Other (See Comments)    Hismanal and Maprobamate  portobello mushrooms - diarrhea  . Sulfonamide Derivatives Hives, Itching and Swelling    Family History  Problem Relation Age of Onset  . Bipolar disorder Mother   . Stomach cancer Mother   . Mental illness Mother   . Stroke Father   . Colon cancer Father   . Throat cancer Maternal Uncle     larynx  . HIV Son     Aids  . Anesthesia problems Neg Hx   . Stomach cancer Maternal Grandfather   . Stomach cancer Paternal Grandmother   . Anxiety disorder Mother   . Diabetes Neg Hx   . Heart disease Maternal Grandmother     Prior to Admission medications   Medication Sig Start Date End Date Taking? Authorizing Provider  Biotin 5000 MCG TABS Take 5,000 mcg by mouth daily.   Yes Historical Provider, MD  bisoprolol (ZEBETA) 5 MG tablet TAKE 1 TABLET BY MOUTH TWICE DAILY 10/17/14  Yes Mosie Lukes, MD  Calcium Carb-Cholecalciferol 600-200 MG-UNIT TABS Take 1 tablet by mouth daily.   Yes Historical Provider, MD  CARTIA XT 120 MG 24 hr capsule Take 120 mg by mouth every morning.  05/30/14  Yes Historical Provider, MD  citalopram (CELEXA) 10 MG tablet TAKE 1 TABLET BY MOUTH EVERY DAY Patient taking differently: Take one tablet by mouth every morning. 09/19/14  Yes Mosie Lukes, MD  clobetasol ointment (TEMOVATE) 0.93 % Apply 1 application topically 2 (two) times daily as needed (for itching).   Yes Historical Provider, MD  Eszopiclone 3 MG TABS Take 1 tablet (3 mg total) by mouth at bedtime as needed. Take immediately before  bedtime Patient taking differently: Take 3 mg by mouth at bedtime. Take immediately before bedtime 01/20/15  Yes Mosie Lukes, MD  gabapentin (NEURONTIN) 600 MG tablet TAKE 1 TABLET BY MOUTH THREE TIMES DAILY 09/11/14  Yes Mosie Lukes, MD  HYDROcodone-acetaminophen (NORCO/VICODIN) 5-325 MG per tablet Take 1 tablet by mouth every 6 (six) hours as needed for moderate pain or severe pain.  01/02/15  Yes Historical Provider, MD  hydrOXYzine (ATARAX/VISTARIL) 10 MG tablet TAKE 1 TABLET BY MOUTH EVERY 6 HOURS AS NEEDED FOR ITCHING OR ANXIETY Patient taking differently: TAKE 1 TABLET BY MOUTH DAILY 10/05/14  Yes Mosie Lukes, MD  MEGARED OMEGA-3 KRILL OIL 500 MG CAPS  Take 1 tablet by mouth daily.   Yes Historical Provider, MD  Multiple Vitamins-Minerals (CENTRUM SILVER ULTRA WOMENS) TABS Take 1 tablet by mouth daily.   Yes Historical Provider, MD  NEXIUM 40 MG capsule TAKE ONE CAPSULE BY MOUTH EVERY DAY AT 2:00 PM Patient taking differently: TAKE ONE CAPSULE BY MOUTH EVERY DAY AT BEDTIME 09/11/14  Yes Mosie Lukes, MD  nortriptyline (PAMELOR) 25 MG capsule TAKE 2 CAPSULES BY MOUTH EVERY NIGHT AT BEDTIME 10/23/14  Yes Mosie Lukes, MD  ranitidine (ZANTAC) 300 MG tablet TAKE 1 TABLET BY MOUTH AT BEDTIME 10/23/14  Yes Mosie Lukes, MD  simvastatin (ZOCOR) 40 MG tablet TAKE 1 TABLET BY MOUTH EVERY EVENING 09/11/14  Yes Mosie Lukes, MD  warfarin (COUMADIN) 5 MG tablet Take 1 tablet (5 mg total) by mouth one time only at 6 PM. Patient taking differently: Take 5-7.5 mg by mouth one time only at 6 PM. Take 5 mg daily in the evening except on Sunday and Thursday. Take 7.5 mg on Sunday and Thursday. 11/22/14  Yes Gaynelle Arabian, MD  ZETIA 10 MG tablet TAKE 1 TABLET BY MOUTH EVERY DAY Patient taking differently: Take one tablet by mouth nightly at bedtime. 09/29/14  Yes Mosie Lukes, MD  metroNIDAZOLE (METROGEL) 0.75 % gel Insert one applicatorful (5g) of medicine into the vagina once nightly x 5 days Patient  not taking: Reported on 01/21/2015 12/27/14   Brunetta Jeans, PA-C  oxyCODONE (OXY IR/ROXICODONE) 5 MG immediate release tablet Take 1-2 tablets (5-10 mg total) by mouth every 3 (three) hours as needed for moderate pain, severe pain or breakthrough pain. Patient not taking: Reported on 01/21/2015 11/22/14   Arlee Muslim, PA-C  torsemide (DEMADEX) 10 MG tablet TAKE 1 TABLET BY MOUTH EVERY MORNING Patient not taking: Reported on 12/27/2014 10/05/14   Mosie Lukes, MD  warfarin (COUMADIN) 5 MG tablet Take 1 tablet (5 mg total) by mouth daily. Take Coumadin for three weeks for postoperative protocol and then the patient may resume their previous Coumadin home regimen.  The dose may need to be adjusted based upon the INR.  Please follow the INR and titrate Coumadin dose for a therapeutic range between 2.0 and 3.0 INR.  After completing the three weeks of Coumadin, the patient may resume their previous Coumadin home regimen. Patient not taking: Reported on 01/21/2015 11/22/14   Arlee Muslim, PA-C    Physical Exam: Filed Vitals:   01/21/15 2015 01/21/15 2030 01/21/15 2131 01/21/15 2140  BP: 107/47 108/48 107/52 109/48  Pulse: 52 51 55 55  Temp:   97.8 F (36.6 C)   TempSrc:   Oral   Resp: 17 18 18    Height:   5\' 4"  (1.626 m)   Weight:   66.769 kg (147 lb 3.2 oz)   SpO2: 93% 93% 98%     General: Alert, Awake and Oriented to Time, Place and Person. Appear in mild distress Eyes: PERRL ENT: Oral Mucosa clear moist. Neck: no JVD Cardiovascular: S1 and S2 Present, no Murmur, Peripheral Pulses Present Respiratory: Bilateral Air entry equal and Decreased,  Clear to Auscultation, no Crackles, no wheezes Abdomen: Bowel Sound present, Soft and non tender Skin: no Rash Extremities: no Pedal edema, no calf tenderness Neurologic: Grossly no focal neuro deficit other than right-sided sensory impairment which is chronic  Labs on Admission:  CBC:  Recent Labs Lab 01/21/15 1636  WBC 11.1*  HGB 14.3  HCT 42.6   MCV 88.9  PLT  302    CMP     Component Value Date/Time   NA 138 01/21/2015 2036   K 4.0 01/21/2015 2036   CL 107 01/21/2015 2036   CO2 23 01/21/2015 2036   GLUCOSE 105* 01/21/2015 2036   GLUCOSE 98 06/30/2006 1110   BUN 15 01/21/2015 2036   CREATININE 1.14* 01/21/2015 2036   CREATININE 0.85 02/10/2014 1026   CALCIUM 8.2* 01/21/2015 2036   PROT 5.3* 01/21/2015 2036   ALBUMIN 3.0* 01/21/2015 2036   AST 16 01/21/2015 2036   ALT 14 01/21/2015 2036   ALKPHOS 69 01/21/2015 2036   BILITOT 0.5 01/21/2015 2036   GFRNONAA 45* 01/21/2015 2036   GFRAA 52* 01/21/2015 2036    No results for input(s): LIPASE, AMYLASE in the last 168 hours.  No results for input(s): CKTOTAL, CKMB, CKMBINDEX, TROPONINI in the last 168 hours. BNP (last 3 results) No results for input(s): BNP in the last 8760 hours.  ProBNP (last 3 results) No results for input(s): PROBNP in the last 8760 hours.   Radiological Exams on Admission: Dg Chest Port 1 View  01/21/2015   CLINICAL DATA:  Tachycardia today  EXAM: PORTABLE CHEST - 1 VIEW  COMPARISON:  11/22/2014  FINDINGS: Heart size and vascular pattern are normal. Lungs are clear. No effusions.  IMPRESSION: No active disease.   Electronically Signed   By: Skipper Cliche M.D.   On: 01/21/2015 17:04   EKG: Independently reviewed. normal sinus rhythm, nonspecific ST and T waves changes.  Assessment/Plan Principal Problem:   Dehydration Active Problems:   GERD   Palpitations   Atrial flutter   AKI (acute kidney injury)   Hypotension   1. Dehydration  Severe hypotension Near syncope  The patient is presenting with generalized weakness ongoing for last 2 days, poor appetite, medication ongoing since morning as well as recurrent episodes of dizziness with near syncope. She has mild elevation of serum creatinine and she is volume responsive suggesting that she has dehydration. Although she denies any vomiting or diarrhea or increased urinary frequency and  does not take any Diuretic. With this her blood pressure has stabilized at present. Her initial blood pressure was 14E systolic currently she is holding 110. She has received 2.5 L of bolus. We will continue her on gentle hydration and monitor her serum creatinine. Check her ins and outs and monitor for volume overload as well.  2. Palpitation. A flutter. This was patient's presenting complaint. Currently on EKG as well as on telemetry she remains in sinus rhythm with sinus bradycardia occasionally. She does not have any symptoms since her arrival to the hospital as per the patient. Possibly she may have a flutter earlier in the morning probably secondary to dehydration. We will monitor her on telemetry here in the hospital. Currently view of sinus bradycardia I would hold off her beta blocker as well as Cardizem which can be resumed as needed.  3. GERD. Continuing both PPI as well as H2 blocker as per her current home treatment.  4. Dizziness and lightheadedness. Peripheral neuropathy. History of anxiety. The patient is on hydroxyzine which can also contribute to dizziness therefore I would stop it and recommended to discontinue it and only use it as needed. She is on high-dose of gabapentin which can also contribute to dizziness which can also be reduced later as an outpatient along with nortriptyline. Currently on continuing both his medications.  5. Acute kidney injury. Probably prerenal in etiology patient denies having any urinary retention or any  abdominal pain or flank pain. Does not have any recent change in her medications. Does not appear to be on any nephrotoxic medications. Currently monitor serum BMP which is improving after hydration. Monitor ins and outs.  Advance goals of care discussion: Full code   DVT Prophylaxis: subcutaneous Heparin Nutrition: Regular diet  Family Communication: family was present at bedside, opportunity was given to ask question and all  questions were answered satisfactorily at the time of interview. Disposition: Admitted as inpatient, telemetry unit.  Author: Berle Mull, MD [facility] Pager: [phone] 01/21/2015  If 7PM-7AM, please contact night-coverage [email or web address] Password [email or web address]

## 2015-01-22 ENCOUNTER — Telehealth: Payer: Self-pay | Admitting: Nurse Practitioner

## 2015-01-22 DIAGNOSIS — E876 Hypokalemia: Secondary | ICD-10-CM

## 2015-01-22 LAB — CBC WITH DIFFERENTIAL/PLATELET
Basophils Absolute: 0 10*3/uL (ref 0.0–0.1)
Basophils Relative: 0 % (ref 0–1)
Eosinophils Absolute: 0.1 10*3/uL (ref 0.0–0.7)
Eosinophils Relative: 1 % (ref 0–5)
HCT: 33 % — ABNORMAL LOW (ref 36.0–46.0)
Hemoglobin: 10.6 g/dL — ABNORMAL LOW (ref 12.0–15.0)
Lymphocytes Relative: 37 % (ref 12–46)
Lymphs Abs: 2.5 10*3/uL (ref 0.7–4.0)
MCH: 29.3 pg (ref 26.0–34.0)
MCHC: 32.1 g/dL (ref 30.0–36.0)
MCV: 91.2 fL (ref 78.0–100.0)
Monocytes Absolute: 0.6 10*3/uL (ref 0.1–1.0)
Monocytes Relative: 9 % (ref 3–12)
Neutro Abs: 3.5 10*3/uL (ref 1.7–7.7)
Neutrophils Relative %: 53 % (ref 43–77)
Platelets: 207 10*3/uL (ref 150–400)
RBC: 3.62 MIL/uL — ABNORMAL LOW (ref 3.87–5.11)
RDW: 15.1 % (ref 11.5–15.5)
WBC: 6.7 10*3/uL (ref 4.0–10.5)

## 2015-01-22 LAB — COMPREHENSIVE METABOLIC PANEL
ALT: 14 U/L (ref 14–54)
AST: 16 U/L (ref 15–41)
Albumin: 2.8 g/dL — ABNORMAL LOW (ref 3.5–5.0)
Alkaline Phosphatase: 61 U/L (ref 38–126)
Anion gap: 6 (ref 5–15)
BUN: 15 mg/dL (ref 6–20)
CO2: 23 mmol/L (ref 22–32)
Calcium: 7.8 mg/dL — ABNORMAL LOW (ref 8.9–10.3)
Chloride: 112 mmol/L — ABNORMAL HIGH (ref 101–111)
Creatinine, Ser: 0.85 mg/dL (ref 0.44–1.00)
GFR calc Af Amer: >60 mL/min (ref >60)
GFR calc non Af Amer: >60 mL/min (ref >60)
Glucose, Bld: 93 mg/dL (ref 70–99)
Potassium: 3.4 mmol/L — ABNORMAL LOW (ref 3.5–5.1)
Sodium: 141 mmol/L (ref 135–145)
Total Bilirubin: 0.3 mg/dL (ref 0.3–1.2)
Total Protein: 5.3 g/dL — ABNORMAL LOW (ref 6.5–8.1)

## 2015-01-22 LAB — URINALYSIS, ROUTINE W REFLEX MICROSCOPIC
Bilirubin Urine: NEGATIVE
Glucose, UA: NEGATIVE mg/dL
Hgb urine dipstick: NEGATIVE
Ketones, ur: NEGATIVE mg/dL
Nitrite: NEGATIVE
Protein, ur: NEGATIVE mg/dL
Specific Gravity, Urine: 1.016 (ref 1.005–1.030)
Urobilinogen, UA: 0.2 mg/dL (ref 0.0–1.0)
pH: 5.5 (ref 5.0–8.0)

## 2015-01-22 LAB — URINE MICROSCOPIC-ADD ON

## 2015-01-22 LAB — PROTIME-INR
INR: 2.47 — ABNORMAL HIGH (ref 0.00–1.49)
Prothrombin Time: 26.9 seconds — ABNORMAL HIGH (ref 11.6–15.2)

## 2015-01-22 MED ORDER — ZOLPIDEM TARTRATE 5 MG PO TABS
5.0000 mg | ORAL_TABLET | Freq: Once | ORAL | Status: AC
  Administered 2015-01-22: 5 mg via ORAL
  Filled 2015-01-22: qty 1

## 2015-01-22 MED ORDER — BISOPROLOL FUMARATE 5 MG PO TABS
5.0000 mg | ORAL_TABLET | Freq: Two times a day (BID) | ORAL | Status: DC
  Filled 2015-01-22: qty 1

## 2015-01-22 MED ORDER — WARFARIN - PHARMACIST DOSING INPATIENT: Freq: Every day | Status: DC

## 2015-01-22 MED ORDER — WARFARIN SODIUM 7.5 MG PO TABS
7.5000 mg | ORAL_TABLET | Freq: Once | ORAL | Status: AC
  Administered 2015-01-22: 7.5 mg via ORAL
  Filled 2015-01-22: qty 1

## 2015-01-22 MED ORDER — ZOLPIDEM TARTRATE 5 MG PO TABS
5.0000 mg | ORAL_TABLET | Freq: Every evening | ORAL | Status: DC | PRN
  Administered 2015-01-22: 5 mg via ORAL
  Filled 2015-01-22: qty 1

## 2015-01-22 MED ORDER — ATORVASTATIN CALCIUM 20 MG PO TABS
20.0000 mg | ORAL_TABLET | Freq: Every day | ORAL | Status: DC
  Administered 2015-01-22: 20 mg via ORAL
  Filled 2015-01-22 (×2): qty 1

## 2015-01-22 MED ORDER — DILTIAZEM HCL ER COATED BEADS 120 MG PO CP24
120.0000 mg | ORAL_CAPSULE | Freq: Every morning | ORAL | Status: DC
  Administered 2015-01-22 – 2015-01-23 (×2): 120 mg via ORAL
  Filled 2015-01-22 (×2): qty 1

## 2015-01-22 MED ORDER — WARFARIN SODIUM 5 MG PO TABS
5.0000 mg | ORAL_TABLET | Freq: Once | ORAL | Status: AC
  Administered 2015-01-22: 5 mg via ORAL
  Filled 2015-01-22: qty 1

## 2015-01-22 MED ORDER — POTASSIUM CHLORIDE CRYS ER 20 MEQ PO TBCR
40.0000 meq | EXTENDED_RELEASE_TABLET | Freq: Once | ORAL | Status: AC
  Administered 2015-01-22: 40 meq via ORAL
  Filled 2015-01-22: qty 2

## 2015-01-22 NOTE — Evaluation (Signed)
Physical Therapy Evaluation and D/C Patient Details Name: Chelsea Hancock MRN: [id number] DOB: 04-Aug-1934 Today's Date: 01/22/2015   History of Present Illness  Chelsea Hancock is a 81 y.o. female with Past medical history of dyslipidemia, hypertension, anxiety, aortic flutter, right leg neuropathy, GERD.Admit today with dehydraition and aflutter.    Clinical Impression  Pt admitted with above diagnosis. Pt currently without significant  functional limitations with pt ambulating with cane with good balance overall.  No significant LOB with good safety as well.  Can use cane at home and will have help prn.  Recommend continued Outpt PT at home to continue her therapy for her right knee.  Pt will benefit from skilled PT as Outpt and has no further inpt needs.  Will sign off.   Follow Up Recommendations Outpatient PT;Supervision - Intermittent    Equipment Recommendations  None recommended by PT    Recommendations for Other Services       Precautions / Restrictions Precautions Precautions: Fall Restrictions Weight Bearing Restrictions: No      Mobility  Bed Mobility Overal bed mobility: Independent                Transfers Overall transfer level: Independent                  Ambulation/Gait Ambulation/Gait assistance: Supervision Ambulation Distance (Feet): 250 Feet Assistive device: Straight cane Gait Pattern/deviations: Step-through pattern;Decreased stride length;Decreased dorsiflexion - right   Gait velocity interpretation: Below normal speed for age/gender General Gait Details: Pt uses cane.  No LOB ambulating in halls.  Pt with notable decr dorsiflexionon right however pt does not lose balance due to it.  Pt overall steady using cane.    Stairs Stairs: Yes Stairs assistance: Supervision Stair Management: Forwards;Step to pattern;With cane;One rail Left Number of Stairs: 3 General stair comments: Pt able to ascend and descend stairs without  assist.   Wheelchair Mobility    Modified Rankin (Stroke Patients Only)       Balance Overall balance assessment: Needs assistance;History of Falls Sitting-balance support: No upper extremity supported;Feet supported Sitting balance-Leahy Scale: Good     Standing balance support: Single extremity supported;During functional activity Standing balance-Leahy Scale: Fair Standing balance comment: Pt was able to ambulate with cane without assist.  Good balance with cane but did not challenge pt.               High level balance activites: Direction changes;Turns;Sudden stops High Level Balance Comments: Pt with good static stance with cane.              Pertinent Vitals/Pain Pain Assessment: No/denies pain  VSS    Home Living Family/patient expects to be discharged to:: Private residence Living Arrangements: Alone Available Help at Discharge: Family;Available 24 hours/day;Friend(s) (friend stays weekends and can be there 24 hours initially) Type of Home: House Home Access: Stairs to enter Entrance Stairs-Rails: Psychiatric nurse of Steps: 6 Home Layout: One level Home Equipment: Cane - single point;Shower seat;Toilet riser Additional Comments: Recent right knee replacement    Prior Function Level of Independence: Independent with assistive device(s)         Comments: uses cane going out, multiple falls     Hand Dominance   Dominant Hand: Right    Extremity/Trunk Assessment   Upper Extremity Assessment: Defer to OT evaluation           Lower Extremity Assessment: Generalized weakness;RLE deficits/detail RLE Deficits / Details: Dorsiflexion limited on right.  premorbid  since back surgery per pt.     Cervical / Trunk Assessment: Normal  Communication   Communication: No difficulties  Cognition Arousal/Alertness: Awake/alert Behavior During Therapy: WFL for tasks assessed/performed Overall Cognitive Status: Within Functional Limits  for tasks assessed                      General Comments      Exercises        Assessment/Plan    PT Assessment All further PT needs can be met in the next venue of care  PT Diagnosis Generalized weakness   PT Problem List Decreased mobility;Decreased knowledge of use of DME  PT Treatment Interventions Therapeutic activities;Functional mobility training;Gait training;Patient/family education   PT Goals (Current goals can be found in the Care Plan section) Acute Rehab PT Goals Patient Stated Goal: to go home PT Goal Formulation: With patient Time For Goal Achievement: 02/05/15 Potential to Achieve Goals: Good    Frequency     Barriers to discharge        Co-evaluation               End of Session Equipment Utilized During Treatment: Gait belt Activity Tolerance: Patient tolerated treatment well Patient left: in bed;with call bell/phone within reach Nurse Communication: Mobility status         Time: 0920-0934 PT Time Calculation (min) (ACUTE ONLY): 14 min   Charges:   PT Evaluation $Initial PT Evaluation Tier I: 1 Procedure     PT G CodesDenice Paradise 01-29-2015, 12:55 PM  Zyionna Pesce [facility] Acute Rehabilitation [phone] (pager)

## 2015-01-22 NOTE — Progress Notes (Signed)
Patient arrived to unit alert and oriented x4. No complaints of pain or discomfort. Sinus brady on telemetry. No skin breakdown. Oriented to room and call light. Advised to notify nurse when getting out of bed.

## 2015-01-22 NOTE — Telephone Encounter (Signed)
Spoke with daughter regarding hospitalization but recommended she call nurse at hospital for further information  Did advised daughter to call when she gets d/c from hospital so follow up ov can be scheduled

## 2015-01-22 NOTE — Progress Notes (Signed)
PROGRESS NOTE    Chelsea Hancock ERD:[id number] DOB: 21-Oct-1933 DOA: 01/21/2015 PCP: Penni Homans, MD  HPI/Brief narrative 81 y.o. female with PMH of dyslipidemia, hypertension, anxiety, paroxysmal aortic flutter on Coumadin, right leg neuropathy, GERD, right TKR on 11/20/14, p/w with complaints of generalized weakness and fatigue ongoing for last 2 days and palpitation ongoing earlier in the morning of 01/21/2015. She denied nausea, vomiting, diarrhea or poor appetite. She denies using diuretics and no recent medication changes were made. She was found to be hypotensive at 69/40 mmHg in ED. Admitted for dehydration & AKI.   Assessment/Plan:  Hypotension - Possibly from dehydration and medications - Aggressively hydrated with IV fluids and medications were temporarily held - Resolved and no orthostatic changes - Monitor closely while home dose of Cardizem resumed  Dehydration - ? Secondary to poor oral intake. No reported GI losses, fever or diuretic use. - Resolved after IV fluid hydration. Encourage ad lib. by mouth fluid intake  Acute kidney injury - Secondary to dehydration and? ATN - Resolved  Paroxysmal atrial flutter - Patient presented with palpitations but remains in SB-SR on monitor - Resume Cardizem CD now. Continue to hold bisoprolol.  - Anticoagulated on Coumadin. Could not afford NOAC as OP  Dizziness/lightheadedness/near syncope - Most likely related to dehydration and hypotension - Symptoms resolved - Not orthostatic - PT recommends outpatient PT follow-up  History of anxiety - Hydroxyzine changed to when necessary  History of neuropathy - Patient on gabapentin and nortriptyline at home  Essential hypertension - Presented with hypotension which has resolved - Resume Cardizem but continue to hold bisoprolol and monitor closely   Hypokalemia - Replace and follow  Anemia - ? Dilutional. No reported blood loss - Follow CBC in a.m.  Code  Status: Full Family Communication: None at bedside Disposition Plan: DC home possibly 5/3   Consultants:  None  Procedures:  Nopne  Antibiotics:  None   Subjective: Feels much better. Denies dizziness, lightheadedness, palpitations, chest pain or dyspnea.   Objective: Filed Vitals:   01/22/15 0109 01/22/15 0535 01/22/15 0920 01/22/15 1343  BP: 95/47 118/53 114/57 133/61  Pulse: 55 56 63 65  Temp: 97.9 F (36.6 C) 97.8 F (36.6 C)  98.3 F (36.8 C)  TempSrc: Oral Oral  Oral  Resp: 18 18  18   Height:      Weight:  67.677 kg (149 lb 3.2 oz)    SpO2: 97% 96%  94%    Intake/Output Summary (Last 24 hours) at 01/22/15 1530 Last data filed at 01/22/15 1500  Gross per 24 hour  Intake   2360 ml  Output   1200 ml  Net   1160 ml   Filed Weights   01/21/15 2131 01/22/15 0535  Weight: 66.769 kg (147 lb 3.2 oz) 67.677 kg (149 lb 3.2 oz)     Exam:  General exam: Pleasant elderly female sitting up comfortably in bed. Respiratory system: Clear. No increased work of breathing. Cardiovascular system: S1 & S2 heard, RRR. No JVD, murmurs, gallops, clicks or pedal edema. telemetry: SB >55 to SR in the 60s  Gastrointestinal system: Abdomen is nondistended, soft and nontender. Normal bowel sounds heard. Central nervous system: Alert and oriented. No focal neurological deficits. Extremities: Symmetric 5 x 5 power.   Data Reviewed: Basic Metabolic Panel:  Recent Labs Lab 01/21/15 1636 01/21/15 2036 01/22/15 0530  NA 138 138 141  K 3.9 4.0 3.4*  CL 104 107 112*  CO2 25 23 23   GLUCOSE  129* 105* 93  BUN 14 15 15   CREATININE 1.63* 1.14* 0.85  CALCIUM 9.3 8.2* 7.8*  MG  --  1.7  --    Liver Function Tests:  Recent Labs Lab 01/21/15 2036 01/22/15 0530  AST 16 16  ALT 14 14  ALKPHOS 69 61  BILITOT 0.5 0.3  PROT 5.3* 5.3*  ALBUMIN 3.0* 2.8*   No results for input(s): LIPASE, AMYLASE in the last 168 hours. No results for input(s): AMMONIA in the last 168  hours. CBC:  Recent Labs Lab 01/21/15 1636 01/22/15 0530  WBC 11.1* 6.7  NEUTROABS  --  3.5  HGB 14.3 10.6*  HCT 42.6 33.0*  MCV 88.9 91.2  PLT 302 207   Cardiac Enzymes: No results for input(s): CKTOTAL, CKMB, CKMBINDEX, TROPONINI in the last 168 hours. BNP (last 3 results) No results for input(s): PROBNP in the last 8760 hours. CBG: No results for input(s): GLUCAP in the last 168 hours.  No results found for this or any previous visit (from the past 240 hour(s)).        Studies: Dg Chest Port 1 View  01/21/2015   CLINICAL DATA:  Tachycardia today  EXAM: PORTABLE CHEST - 1 VIEW  COMPARISON:  11/22/2014  FINDINGS: Heart size and vascular pattern are normal. Lungs are clear. No effusions.  IMPRESSION: No active disease.   Electronically Signed   By: Skipper Cliche M.D.   On: 01/21/2015 17:04        Scheduled Meds: . bisoprolol  5 mg Oral BID  . citalopram  10 mg Oral q morning - 10a  . diltiazem  120 mg Oral q morning - 10a  . ezetimibe  10 mg Oral Daily  . famotidine  20 mg Oral QHS  . gabapentin  600 mg Oral TID  . nortriptyline  50 mg Oral QHS  . pantoprazole  40 mg Oral Daily  . simvastatin  40 mg Oral QPM  . sodium chloride  3 mL Intravenous Q12H  . warfarin  5 mg Oral ONCE-1800  . Warfarin - Pharmacist Dosing Inpatient   Does not apply q1800   Continuous Infusions: . sodium chloride 100 mL/hr at 01/22/15 1505    Principal Problem:   Dehydration Active Problems:   GERD   Palpitations   Atrial flutter   AKI (acute kidney injury)   Hypotension    Time spent: 32 minutes    Chelsea Tuch, MD, FACP, FHM. [facility] Pager [phone]  If 7PM-7AM, please contact night-coverage [email or web address] Password [email or web address] 01/22/2015, 3:30 PM    LOS: 1 day

## 2015-01-22 NOTE — Telephone Encounter (Signed)
New Message  Pt daughter calling to speak w/ Chelsea Hancock- Pt admitted to hospital and is going to stay in hosp tonight due to Low BP. Pt daughter wants to know if our office is aware and wants to know more info. Please call back and discuss.

## 2015-01-22 NOTE — Progress Notes (Signed)
Wrong patient

## 2015-01-22 NOTE — Progress Notes (Signed)
ANTICOAGULATION CONSULT NOTE - Follow Up Consult  Pharmacy Consult:  Coumadin Indication:  Aflutter  Allergies  Allergen Reactions  . Cefuroxime Axetil Anaphylaxis  . Macrodantin Anaphylaxis  . Alendronate Sodium Other (See Comments)    Severe chest pain similar to heart attack   . Codeine Nausea And Vomiting  . Demerol [Meperidine] Other (See Comments)    hallucinations  . Meperidine Hcl Nausea And Vomiting  . Other Other (See Comments)    Hismanal and Maprobamate  portobello mushrooms - diarrhea  . Sulfonamide Derivatives Hives, Itching and Swelling    Patient Measurements: Height: 5\' 4"  (162.6 cm) Weight: 149 lb 3.2 oz (67.677 kg) (SCALE c) IBW/kg (Calculated) : 54.7  Vital Signs: Temp: 97.8 F (36.6 C) (05/02 0535) Temp Source: Oral (05/02 0535) BP: 118/53 mmHg (05/02 0535) Pulse Rate: 56 (05/02 0535)  Labs:  Recent Labs  01/21/15 1636 01/21/15 2036 01/22/15 0403 01/22/15 0530  HGB 14.3  --   --  10.6*  HCT 42.6  --   --  33.0*  PLT 302  --   --  207  LABPROT 22.8*  --  26.9*  --   INR 1.99*  --  2.47*  --   CREATININE 1.63* 1.14*  --  0.85    Estimated Creatinine Clearance: 51.6 mL/min (by C-G formula based on Cr of 0.85).     Assessment: 12 YOF continues on Coumadin from PTA for history of Aflutter.  INR therapeutic today; no bleeding reported.   Goal of Therapy:  INR 2-3    Plan:  - Coumadin 5mg  PO today - Daily PT / INR    Genever Hentges D. Mina Marble, PharmD, BCPS Pager:  [phone] 01/22/2015, 8:30 AM

## 2015-01-22 NOTE — Care Management Note (Signed)
Case Management Note  Patient Details  Name: ANNALISE MCDIARMID MRN: [id number] Date of Birth: 07-08-1934  Subjective/Objective: Pt adm on 01/21/15 with dehydration, AKI, fall.  PTA, pt resides at home alone, and is independent.                     Action/Plan: PT recommending continued OP therapy, as prior to admission.    Checked Eliquis coverage:  copay is $35, with no prior authorization needed.    Expected Discharge Date:  01/23/15               Expected Discharge Plan:  Home/Self Care  In-House Referral:  NA  Discharge planning Services  CM Consult, Medication Assistance  Post Acute Care Choice:    Choice offered to:     DME Arranged:    DME Agency:     HH Arranged:    HH Agency:     Status of Service:  Completed, signed off  Medicare Important Message Given:    Date Medicare IM Given:    Medicare IM give by:    Date Additional Medicare IM Given:    Additional Medicare Important Message give by:     If discussed at [place] of Stay Meetings, dates discussed:    Additional Comments:  Ella Bodo, RN 01/22/2015, 2:34 PM

## 2015-01-22 NOTE — Progress Notes (Signed)
WRONG PATIENT VITALS

## 2015-01-23 ENCOUNTER — Telehealth: Payer: Self-pay | Admitting: Family Medicine

## 2015-01-23 LAB — PROTIME-INR
INR: 2.55 — ABNORMAL HIGH (ref 0.00–1.49)
Prothrombin Time: 27.6 seconds — ABNORMAL HIGH (ref 11.6–15.2)

## 2015-01-23 LAB — CBC
HCT: 35 % — ABNORMAL LOW (ref 36.0–46.0)
Hemoglobin: 11.2 g/dL — ABNORMAL LOW (ref 12.0–15.0)
MCH: 28.6 pg (ref 26.0–34.0)
MCHC: 32 g/dL (ref 30.0–36.0)
MCV: 89.5 fL (ref 78.0–100.0)
Platelets: 199 10*3/uL (ref 150–400)
RBC: 3.91 MIL/uL (ref 3.87–5.11)
RDW: 15 % (ref 11.5–15.5)
WBC: 6.4 10*3/uL (ref 4.0–10.5)

## 2015-01-23 LAB — BASIC METABOLIC PANEL
Anion gap: 9 (ref 5–15)
BUN: 9 mg/dL (ref 6–20)
CO2: 23 mmol/L (ref 22–32)
Calcium: 8.7 mg/dL — ABNORMAL LOW (ref 8.9–10.3)
Chloride: 108 mmol/L (ref 101–111)
Creatinine, Ser: 0.72 mg/dL (ref 0.44–1.00)
GFR calc Af Amer: >60 mL/min (ref >60)
GFR calc non Af Amer: >60 mL/min (ref >60)
Glucose, Bld: 86 mg/dL (ref 70–99)
Potassium: 3.7 mmol/L (ref 3.5–5.1)
Sodium: 140 mmol/L (ref 135–145)

## 2015-01-23 MED ORDER — BISOPROLOL FUMARATE 5 MG PO TABS
5.0000 mg | ORAL_TABLET | Freq: Two times a day (BID) | ORAL | Status: DC
  Administered 2015-01-23: 5 mg via ORAL
  Filled 2015-01-23 (×2): qty 1

## 2015-01-23 MED ORDER — HYDROXYZINE HCL 10 MG PO TABS: 10.0000 mg | ORAL_TABLET | Freq: Every day | ORAL | Status: DC

## 2015-01-23 MED ORDER — APIXABAN 5 MG PO TABS: 5.0000 mg | ORAL_TABLET | Freq: Two times a day (BID) | ORAL | Status: DC

## 2015-01-23 NOTE — Progress Notes (Signed)
Patient alert and oriented x 3. Skin warm and dry. Denies any discomfort or pain. Ambulates in room with cane without difficulty.

## 2015-01-23 NOTE — Discharge Instructions (Addendum)
DO NOT TAKE WARFARIN ANY MORE.  You have an appointment at the anticoagulation clinic at Dr. Sherryl Barters office ([place]) on 01/25/2015 at 12:15. They will check your INR at the appointment, and if it is low enough, you will start taking Eliquis.    Information on my medicine - ELIQUIS (apixaban)  This medication education was reviewed with me or my healthcare representative as part of my discharge preparation.  The pharmacist that spoke with me during my hospital stay was:  Bajbus, Lauren, RPH  Why was Eliquis prescribed for you? Eliquis was prescribed for you to reduce the risk of a blood clot forming that can cause a stroke if you have a medical condition called atrial fibrillation (a type of irregular heartbeat).  What do You need to know about Eliquis ? Take your Eliquis TWICE DAILY - one tablet in the morning and one tablet in the evening with or without food. If you have difficulty swallowing the tablet whole please discuss with your pharmacist how to take the medication safely.  Take Eliquis exactly as prescribed by your doctor and DO NOT stop taking Eliquis without talking to the doctor who prescribed the medication.  Stopping may increase your risk of developing a stroke.  Refill your prescription before you run out.  After discharge, you should have regular check-up appointments with your healthcare provider that is prescribing your Eliquis.  In the future your dose may need to be changed if your kidney function or weight changes by a significant amount or as you get older.  What do you do if you miss a dose? If you miss a dose, take it as soon as you remember on the same day and resume taking twice daily.  Do not take more than one dose of ELIQUIS at the same time to make up a missed dose.  Important Safety Information A possible side effect of Eliquis is bleeding. You should call your healthcare provider right away if you experience any of the  following: ? Bleeding from an injury or your nose that does not stop. ? Unusual colored urine (red or dark brown) or unusual colored stools (red or black). ? Unusual bruising for unknown reasons. ? A serious fall or if you hit your head (even if there is no bleeding).  Some medicines may interact with Eliquis and might increase your risk of bleeding or clotting while on Eliquis. To help avoid this, consult your healthcare provider or pharmacist prior to using any new prescription or non-prescription medications, including herbals, vitamins, non-steroidal anti-inflammatory drugs (NSAIDs) and supplements.  This website has more information on Eliquis (apixaban): http://www.eliquis.com/eliquis/home  Hypotension As your heart beats, it forces blood through your arteries. This force is your blood pressure. If your blood pressure is too low for you to go about your normal activities or to support the organs of your body, you have hypotension. Hypotension is also referred to as low blood pressure. When your blood pressure becomes too low, you may not get enough blood to your brain. As a result, you may feel weak, feel lightheaded, or develop a rapid heart rate. In a more severe case, you may faint. CAUSES Various conditions can cause hypotension. These include:  Blood loss.  Dehydration.  Heart or endocrine problems.  Pregnancy.  Severe infection.  Not having a well-balanced diet filled with needed nutrients.  Severe allergic reactions (anaphylaxis). Some medicines, such as blood pressure medicine or water pills (diuretics), may lower your blood pressure below normal. Sometimes  taking too much medicine or taking medicine not as directed can cause hypotension. TREATMENT  Hospitalization is sometimes required for hypotension if fluid or blood replacement is needed, if time is needed for medicines to wear off, or if further monitoring is needed. Treatment might include changing your diet,  changing your medicines (including medicines aimed at raising your blood pressure), and use of support stockings. HOME CARE INSTRUCTIONS   Drink enough fluids to keep your urine clear or pale yellow.  Take your medicines as directed by your health care provider.  Get up slowly from reclining or sitting positions. This gives your blood pressure a chance to adjust.  Wear support stockings as directed by your health care provider.  Maintain a healthy diet by including nutritious food, such as fruits, vegetables, nuts, whole grains, and lean meats. SEEK MEDICAL CARE IF:  You have vomiting or diarrhea.  You have a fever for more than 2-3 days.  You feel more thirsty than usual.  You feel weak and tired. SEEK IMMEDIATE MEDICAL CARE IF:   You have chest pain or a fast or irregular heartbeat.  You have a loss of feeling in some part of your body, or you lose movement in your arms or legs.  You have trouble speaking.  You become sweaty or feel lightheaded.  You faint. MAKE SURE YOU:   Understand these instructions.  Will watch your condition.  Will get help right away if you are not doing well or get worse. Document Released: 09/08/2005 Document Revised: 06/29/2013 Document Reviewed: 03/11/2013 [facility] Patient Information 2015 [place], [place]. This information is not intended to replace advice given to you by your health care provider. Make sure you discuss any questions you have with your health care provider.  Acute Kidney Injury Acute kidney injury is a disease in which there is sudden (acute) damage to the kidneys. The kidneys are 2 organs that lie on either side of the spine between the middle of the back and the front of the abdomen. The kidneys:  Remove wastes and extra water from the blood.   Produce important hormones. These help keep bones strong, regulate blood pressure, and help create red blood cells.   Balance the fluids and chemicals in the blood and  tissues. A small amount of kidney damage may not cause problems, but a large amount of damage may make it difficult or impossible for the kidneys to work the way they should. Acute kidney injury may develop into long-lasting (chronic) kidney disease. It may also develop into a life-threatening disease called end-stage kidney disease. Acute kidney injury can get worse very quickly, so it should be treated right away. Early treatment may prevent other kidney diseases from developing.  CAUSES   A problem with blood flow to the kidneys. This may be caused by:   Blood loss.   Heart disease.   Severe burns.   Liver disease.  Direct damage to the kidneys. This may be caused by:  Some medicines.   A kidney infection.   Poisoning or consuming toxic substances.   A surgical wound.   A blow to the kidney area.   A problem with urine flow. This may be caused by:   Cancer.   Kidney stones.   An enlarged prostate. SYMPTOMS   Swelling (edema) of the legs, ankles, or feet.   Tiredness (lethargy).   Nausea or vomiting.   Confusion.   Problems with urination, such as:   Painful or burning feeling during urination.  Decreased urine production.   Frequent accidents in children who are potty trained.   Bloody urine.   Muscle twitches and cramps.   Shortness of breath.   Seizures.   Chest pain or pressure. Sometimes, no symptoms are present. DIAGNOSIS Acute kidney injury may be detected and diagnosed by tests, including blood, urine, imaging, or kidney biopsy tests.  TREATMENT Treatment of acute kidney injury varies depending on the cause and severity of the kidney damage. In mild cases, no treatment may be needed. The kidneys may heal on their own. If acute kidney injury is more severe, your caregiver will treat the cause of the kidney damage, help the kidneys heal, and prevent complications from occurring. Severe cases may require a procedure to  remove toxic wastes from the body (dialysis) or surgery to repair kidney damage. Surgery may involve:   Repair of a torn kidney.   Removal of an obstruction. Most of the time, you will need to stay overnight at the hospital.  HOME CARE INSTRUCTIONS:  Follow your prescribed diet.  Only take over-the-counter or prescription medicines as directed by your caregiver.  Do not take any new medicines (prescription, over-the-counter, or nutritional supplements) unless approved by your caregiver. Many medicines can worsen your kidney damage or need to have the dose adjusted.   Keep all follow-up appointments as directed by your caregiver.  Observe your condition to make sure you are healing as expected. SEEK IMMEDIATE MEDICAL CARE IF:  You are feeling ill or have severe pain in the back or side.   Your symptoms return or you have new symptoms.  You have any symptoms of end-stage kidney disease. These include:   Persistent itchiness.   Loss of appetite.   Headaches.   Abnormally dark or light skin.  Numbness in the hands or feet.   Easy bruising.   Frequent hiccups.   Menstruation stops.   You have a fever.  You have increased urine production.  You have pain or bleeding when urinating. MAKE SURE YOU:   Understand these instructions.  Will watch your condition.  Will get help right away if you are not doing well or get worse Document Released: 03/24/2011 Document Revised: 01/03/2013 Document Reviewed: 05/07/2012 [facility] Patient Information 2015 [place], [place]. This information is not intended to replace advice given to you by your health care provider. Make sure you discuss any questions you have with your health care provider.

## 2015-01-23 NOTE — Consult Note (Signed)
   [facility] CM Inpatient Consult   01/23/2015  Rayette T Rabago 05/13/1934 [id number] Referral received.  Met with the patient at bedside regarding [facility] Care Management services.  Patient declined needs at this time but did request to take a brochure. Patient states that her daughter is of assistance to her in making health care decisions. Patient denies any problems with medications or care needs.   Encouraged patient to contact the office for any changes in her needs.  Copy of brochure given. No further needs identified at this time. For questions, please contact:  , RN BSN CCM [facility] Liaison  [phone] business mobile phone   

## 2015-01-23 NOTE — Discharge Summary (Signed)
Physician Discharge Summary  Chelsea Hancock IOX:[id number] DOB: 09/28/1933 DOA: 01/21/2015  PCP: Penni Homans, MD  Admit date: 01/21/2015 Discharge date: 01/23/2015  Time spent: Less than 30 minutes  Recommendations for Outpatient Follow-up:  1. Dr. Willette Alma, PCP in one week to be seen with repeat labs (CBC & BMP) 2. [facility] Cou[facility]in clinic on 01/25/15 for repeat INR and if <2, start Eliquis (patient provided with prescription) 3. Outpatient PT to continue  Discharge Diagnoses:  Principal Problem:   Dehydration Active Problems:   GERD   Palpitations   Atrial flutter   AKI (acute kidney injury)   Hypotension   Discharge Condition: Improved & Stable  Diet recommendation: Heart healthy diet  Filed Weights   01/21/15 2131 01/22/15 0535 01/23/15 0504  Weight: 66.769 kg (147 lb 3.2 oz) 67.677 kg (149 lb 3.2 oz) 67.586 kg (149 lb)    History of present illness:  81 y.o. female with PMH of dyslipidemia, hypertension, anxiety, paroxysmal aortic flutter on Coumadin, right leg neuropathy, GERD, right TKR on 11/20/14, p/w with complaints of generalized weakness and fatigue ongoing for 2 days pta and palpitation ongoing earlier on the morning of 01/21/2015. She denied nausea, vomiting, diarrhea or poor appetite. She denies using diuretics and no recent medication changes were made. She was found to be hypotensive at 69/40 mmHg in ED. Admitted for dehydration & AKI.  Hospital Course:   Hypotension - Possibly from dehydration and medications - Aggressively hydrated with IV fluids and medications were temporarily held - Resolved and no orthostatic changes - Resumed Cardizem first which she tolerated and her blood pressures actually started rising and hence was also resumed on home dose of bisoprolol.  Dehydration - ? Secondary to poor oral intake. No reported GI losses, fever or diuretic use. - Resolved after IV fluid hydration. Encourage ad lib. by mouth fluid  intake  Acute kidney injury - Secondary to dehydration and? ATN - Resolved  Paroxysmal atrial flutter - Patient presented with palpitations but remained in SB-SR on monitor - Resumed Cardizem CD followed by home dose of bisoprolol - Anticoagulated on Coumadin PTA. Could not afford NOAC as OP - Case manager was able to explore and patient now able to afford NOAC hence plans to transition to Eliquis as outpatient when INR <2. Patient was counseled extensively regarding management and arranged for outpatient follow-up with her Coumadin clinic for INR check. - INR on day of discharge: 2.55.  Dizziness/lightheadedness/near syncope - Most likely related to dehydration and hypotension - Symptoms resolved - Not orthostatic - PT recommends outpatient PT follow-up  History of anxiety - Continue home regimen.  History of neuropathy - Patient on gabapentin and nortriptyline at home  Essential hypertension - Presented with hypotension which has resolved - Resumed Cardizem and bisoproas indicated above.  Hypokalemia - Replaced  Anemia - ? Dilutional. No reported blood loss - Hemoglobin stable on day of discharge. Hemoglobin has been in the 10 g per DL range in March-probably related to recent surgery. - Close outpatient follow-up and evaluation as deemed necessary.  Consultants:  None  Procedures:  None  Discharge Exam:  Complaints:  patient denied complaints. She felt stronger. Denied dizziness, lightheadedness, dyspnea, chest pain or palpitations even with ambulating in the room to the bathroom.   Filed Vitals:   01/23/15 0200 01/23/15 0504 01/23/15 1027 01/23/15 1348  BP: 154/81 162/71 149/70 128/69  Pulse: 64 60 60 66  Temp: 97 F (36.1 C) 97.4 F (36.3 C) 98.2 F (  36.8 C) 98.2 F (36.8 C)  TempSrc: Oral Oral Other (Comment) Oral  Resp: 20 18 18 18   Height:      Weight:  67.586 kg (149 lb)    SpO2: 100% 96% 99% 98%    General exam: Pleasant elderly female  sitting up comfortably in bed. Respiratory system: Clear. No increased work of breathing. Cardiovascular system: S1 & S2 heard, RRR. No JVD, murmurs, gallops, clicks or pedal edema. telemetry: SB >55 to SR in the 60s  Gastrointestinal system: Abdomen is nondistended, soft and nontender. Normal bowel sounds heard. Central nervous system: Alert and oriented. No focal neurological deficits. Extremities: Symmetric 5 x 5 power  Discharge Instructions      Discharge Instructions    Call MD for:  difficulty breathing, headache or visual disturbances    Complete by:  As directed      Call MD for:  extreme fatigue    Complete by:  As directed      Call MD for:  persistant dizziness or light-headedness    Complete by:  As directed      Call MD for:  severe uncontrolled pain    Complete by:  As directed      Call MD for:    Complete by:  As directed   Heart racing/Palpitations.     Diet - low sodium heart healthy    Complete by:  As directed      Increase activity slowly    Complete by:  As directed             Medication List    STOP taking these medications        warfarin 5 MG tablet  Commonly known as:  COUMADIN      TAKE these medications        apixaban 5 MG Tabs tablet  Commonly known as:  ELIQUIS  Take 1 tablet (5 mg total) by mouth 2 (two) times daily. Start when advised to do so by your Coumadin clinic- possibly 01/25/2015     Biotin 5000 MCG Tabs  Take 5,000 mcg by mouth daily.     bisoprolol 5 MG tablet  Commonly known as:  ZEBETA  TAKE 1 TABLET BY MOUTH TWICE DAILY     Calcium Carb-Cholecalciferol 600-200 MG-UNIT Tabs  Take 1 tablet by mouth daily.     CARTIA XT 120 MG 24 hr capsule  Generic drug:  diltiazem  Take 120 mg by mouth every morning.     CENTRUM SILVER ULTRA WOMENS Tabs  Take 1 tablet by mouth daily.     citalopram 10 MG tablet  Commonly known as:  CELEXA  TAKE 1 TABLET BY MOUTH EVERY DAY     clobetasol ointment 0.05 %  Commonly known as:   TEMOVATE  Apply 1 application topically 2 (two) times daily as needed (for itching).     Eszopiclone 3 MG Tabs  Take 1 tablet (3 mg total) by mouth at bedtime as needed. Take immediately before bedtime     gabapentin 600 MG tablet  Commonly known as:  NEURONTIN  TAKE 1 TABLET BY MOUTH THREE TIMES DAILY     HYDROcodone-acetaminophen 5-325 MG per tablet  Commonly known as:  NORCO/VICODIN  Take 1 tablet by mouth every 6 (six) hours as needed for moderate pain or severe pain.     hydrOXYzine 10 MG tablet  Commonly known as:  ATARAX/VISTARIL  Take 1 tablet (10 mg total) by mouth daily.  MEGARED OMEGA-3 KRILL OIL 500 MG Caps  Take 1 tablet by mouth daily.     NEXIUM 40 MG capsule  Generic drug:  esomeprazole  TAKE ONE CAPSULE BY MOUTH EVERY DAY AT 2:00 PM     nortriptyline 25 MG capsule  Commonly known as:  PAMELOR  TAKE 2 CAPSULES BY MOUTH EVERY NIGHT AT BEDTIME     ranitidine 300 MG tablet  Commonly known as:  ZANTAC  TAKE 1 TABLET BY MOUTH AT BEDTIME     simvastatin 40 MG tablet  Commonly known as:  ZOCOR  TAKE 1 TABLET BY MOUTH EVERY EVENING     ZETIA 10 MG tablet  Generic drug:  ezetimibe  TAKE 1 TABLET BY MOUTH EVERY DAY       Follow-up Information    Follow up with Penni Homans, MD. Schedule an appointment as soon as possible for a visit in 1 week.   Specialty:  Family Medicine   Why:  to be seen with repeat labs (CBC & BMP).   Contact information:   [place] [id number] [phone]        The results of significant diagnostics from this hospitalization (including imaging, microbiology, ancillary and laboratory) are listed below for reference.    Significant Diagnostic Studies: Dg Chest Port 1 View  01/21/2015   CLINICAL DATA:  Tachycardia today  EXAM: PORTABLE CHEST - 1 VIEW  COMPARISON:  11/22/2014  FINDINGS: Heart size and vascular pattern are normal. Lungs are clear. No effusions.  IMPRESSION: No active disease.    Electronically Signed   By: Skipper Cliche M.D.   On: 01/21/2015 17:04    Microbiology: No results found for this or any previous visit (from the past 240 hour(s)).   Labs: Basic Metabolic Panel:  Recent Labs Lab 01/21/15 1636 01/21/15 2036 01/22/15 0530 01/23/15 0420  NA 138 138 141 140  K 3.9 4.0 3.4* 3.7  CL 104 107 112* 108  CO2 25 23 23 23   GLUCOSE 129* 105* 93 86  BUN 14 15 15 9   CREATININE 1.63* 1.14* 0.85 0.72  CALCIUM 9.3 8.2* 7.8* 8.7*  MG  --  1.7  --   --    Liver Function Tests:  Recent Labs Lab 01/21/15 2036 01/22/15 0530  AST 16 16  ALT 14 14  ALKPHOS 69 61  BILITOT 0.5 0.3  PROT 5.3* 5.3*  ALBUMIN 3.0* 2.8*   No results for input(s): LIPASE, AMYLASE in the last 168 hours. No results for input(s): AMMONIA in the last 168 hours. CBC:  Recent Labs Lab 01/21/15 1636 01/22/15 0530 01/23/15 0420  WBC 11.1* 6.7 6.4  NEUTROABS  --  3.5  --   HGB 14.3 10.6* 11.2*  HCT 42.6 33.0* 35.0*  MCV 88.9 91.2 89.5  PLT 302 207 199   Cardiac Enzymes: No results for input(s): CKTOTAL, CKMB, CKMBINDEX, TROPONINI in the last 168 hours. BNP: BNP (last 3 results) No results for input(s): BNP in the last 8760 hours.  ProBNP (last 3 results) No results for input(s): PROBNP in the last 8760 hours.  CBG: No results for input(s): GLUCAP in the last 168 hours.      Signed:  Vernell Leep, MD, FACP, FHM. [facility] Pager [phone]  If 7PM-7AM, please contact night-coverage [email or web address] Password [facility] 01/23/2015, 2:37 PM

## 2015-01-23 NOTE — Progress Notes (Signed)
Discussed during rounds that Ms. Chelsea Hancock would like to switch to Eliquis for her AFib.  Spoke with Jonelle Sidle, RN at [facility]. Ms. Chelsea Hancock has an appointment to have her INR checked on Thursday 5/5 at  12:15.  If INR is <2 at this point, she can be transitioned to Eliquis 5mg  BID (does not meet any qualifications for lower Eliquis dose).  Spoke with Ms. Chelsea Hancock to HOLD her warfarin until this appointment.  Also provided standard Eliquis teaching points which will be reinforced at appointment on Thursday. She was on Eliquis previously.  Discharge instructions and appointment time also left on AVS.  Informed Dr. Algis Liming of the above.   Shandrell Boda D. Sosie Gato, PharmD, BCPS Clinical Pharmacist Pager: [phone] 01/23/2015 10:00 AM

## 2015-01-23 NOTE — Telephone Encounter (Signed)
°  Relation to pt: self  Call back number: [phone] Pharmacy: [id number] - [place], [address] AT [place] [phone] (Phone) [phone] (Fax)         Reason for call:  Pt states pharmacy will not fill medication, pt states she is checking on the status of med refill she notified office last week  nortriptyline (PAMELOR) 25 MG capsule and Lunesta. Pt states she is completely out and would like refill today

## 2015-01-23 NOTE — Progress Notes (Signed)
Pt discharged home.  Discharge instructions completed. Patient understands the instrucions about coumadin and eliquis. Script given for eliquis.

## 2015-01-24 ENCOUNTER — Telehealth: Payer: Self-pay | Admitting: Family Medicine

## 2015-01-24 ENCOUNTER — Telehealth: Payer: Self-pay | Admitting: *Deleted

## 2015-01-24 ENCOUNTER — Other Ambulatory Visit: Payer: Self-pay | Admitting: Physician Assistant

## 2015-01-24 MED ORDER — NORTRIPTYLINE HCL 25 MG PO CAPS: 50.0000 mg | ORAL_CAPSULE | Freq: Every day | ORAL | Status: DC

## 2015-01-24 MED ORDER — ESZOPICLONE 3 MG PO TABS: 3.0000 mg | ORAL_TABLET | Freq: Every evening | ORAL | Status: DC | PRN

## 2015-01-24 NOTE — Telephone Encounter (Addendum)
Pt called very upset stating she is thoroughly disgusted no one has followed up with her and that she cried herself to sleep do to her not having meds. Forwarded to PA on duty

## 2015-01-24 NOTE — Telephone Encounter (Signed)
Caller: Chelsea Hancock Rel to pt: self Ph #: [phone] Pharmacy: [id number] - [place], [address] AT [place] called very upset. She states she was informed this morning that her meds were sent to the pharmacy. She went to pick up and they do not have the rx. In the med record it appears the rx's were printed instead of faxed or electronically sent. Pt requesting that rx's sent asap & that she is notified they were sent. Thank you.

## 2015-01-24 NOTE — Telephone Encounter (Signed)
Pt checking on the status of medication request.

## 2015-01-24 NOTE — Telephone Encounter (Signed)
Looks like a refill for Chelsea Hancock was sent in last week on 4/30 and we have not refilled Chelsea Hancock yet. Can you check with her pharmacy and confirm what prescriptions they have and do not have I am fine with her having refills on both #30 with 1 rf. Confirm she has been seen in past 6 months and/or that she has appt at end of 6 months since last visit. THX

## 2015-01-24 NOTE — Telephone Encounter (Signed)
Reviewed note and was able to see where Dr. Charlett Blake approved. Rx.  Rx located as they were printed but awaiting MD signature.  Shredded Rx and reprinted under my name.  Signed and faxed to pharmacy.  Pharmacy confirmation received.   FYI

## 2015-01-24 NOTE — Telephone Encounter (Signed)
error:[id number] ° °

## 2015-01-24 NOTE — Telephone Encounter (Signed)
Spoke with pharmacist at [id number] - [place], [address] AT [place] and pharmacist states that they do not have a current Rx for either medication, and both refills were picked up over a month ago.    Patient has not been seen in past 6 months but has hospital follow-up 01/29/15 (will call for tcm) and regular follow-up 02/08/15.

## 2015-01-24 NOTE — Telephone Encounter (Signed)
Rx for both printed and awaiting MD signature.

## 2015-01-25 ENCOUNTER — Ambulatory Visit (INDEPENDENT_AMBULATORY_CARE_PROVIDER_SITE_OTHER): Payer: Medicare Other | Admitting: *Deleted

## 2015-01-25 ENCOUNTER — Telehealth: Payer: Self-pay | Admitting: *Deleted

## 2015-01-25 DIAGNOSIS — I4892 Unspecified atrial flutter: Secondary | ICD-10-CM

## 2015-01-25 LAB — POCT INR: INR: 1.4

## 2015-01-25 NOTE — Telephone Encounter (Signed)
Unable to reach patient at time of TCM Call. Left message for patient to return call when available.  

## 2015-01-25 NOTE — Addendum Note (Signed)
Addended by: Leticia Penna A on: 01/25/2015 04:49 PM   Modules accepted: Medications

## 2015-01-25 NOTE — Telephone Encounter (Signed)
Patient returned phone call. Best # [phone]

## 2015-01-25 NOTE — Telephone Encounter (Signed)
Refills were done and signed by Elyn Aquas PA

## 2015-01-25 NOTE — Telephone Encounter (Signed)
Transition Care Management Follow-up Telephone Call  How have you been since you were released from the hospital? "fine" reports no palpitations since discharge    Do you understand why you were in the hospital? YES Dehydration and heart beat    Do you understand the discharge instrcutions? YES   Items Reviewed:  Medications reviewed: YES - back on coumadin   Allergies reviewed: YES   Dietary changes reviewed: YES- no changes   Referrals reviewed: YES    Functional Questionnaire:   Activities of Daily Living (ADLs):   She states they are independent in the following: cooking, cleaning, bathing, walking around, transportation  States they require assistance with the following: none    Any transportation issues/concerns?: NO    Any patient concerns? "No, I can't think of anything"   Confirmed importance and date/time of follow-up visits scheduled: YES- scheduled with Dr. Charlett Blake 01/29/15 at 3:00 pm, patient confirmed.    Confirmed with patient if condition begins to worsen call PCP or go to the ER.  Patient was given the Call-a-Nurse line [phone]: YES

## 2015-01-26 ENCOUNTER — Ambulatory Visit (INDEPENDENT_AMBULATORY_CARE_PROVIDER_SITE_OTHER): Payer: Medicare Other | Admitting: Medical

## 2015-01-26 ENCOUNTER — Encounter: Payer: Self-pay | Admitting: Medical

## 2015-01-26 VITALS — BP 144/69 | HR 57 | Temp 98.2°F | Ht 63.0 in | Wt 146.0 lb

## 2015-01-26 DIAGNOSIS — R350 Frequency of micturition: Secondary | ICD-10-CM

## 2015-01-26 DIAGNOSIS — N390 Urinary tract infection, site not specified: Secondary | ICD-10-CM | POA: Diagnosis not present

## 2015-01-26 LAB — POCT URINALYSIS DIPSTICK
Bilirubin, UA: 1
Blood, UA: POSITIVE
Glucose, UA: NEGATIVE
Ketones, UA: NEGATIVE
Nitrite, UA: NEGATIVE
Protein, UA: 30
Spec Grav, UA: 1.015
Urobilinogen, UA: 0.2
pH, UA: 6.5

## 2015-01-26 MED ORDER — CIPROFLOXACIN HCL 250 MG PO TABS: 250.0000 mg | ORAL_TABLET | Freq: Two times a day (BID) | ORAL | Status: DC

## 2015-01-26 NOTE — Assessment & Plan Note (Signed)
Your appear to have a urinary tract infection. I am prescribing antibiotic cipro for the probable infection. Hydrate well. I am sending out a urine culture. During the interim if your signs and symptoms worsen rather than improving please notify us. We will notify your when the culture results are back.  Follow up in 7 days or as needed.

## 2015-01-26 NOTE — Progress Notes (Signed)
Subjective:    Patient ID: Chelsea Hancock, female    DOB: 07/25/1934, 81 y.o.   MRN: [id number]  HPI  Pt in today reporting urinary symptoms.  Dysuria- No. Frequent urination-yes. Started today. Hesitancy-some Suprapubic pressure-mild pressure. Fever-no chills-no Nausea-no Vomiting-no CVA pain-no History of UTI- A lot when she was younger. But some time since last. Maybe 25 years. Gross hematuria- no  Review of Systems  Constitutional: Negative for fever, chills and fatigue.  Respiratory: Negative for cough, chest tightness and wheezing.   Cardiovascular: Negative for chest pain and palpitations.  Gastrointestinal: Negative for nausea, vomiting, abdominal pain, diarrhea and constipation.  Genitourinary: Positive for urgency and frequency. Negative for dysuria, flank pain, vaginal bleeding and vaginal discharge.  Musculoskeletal: Negative for back pain.  Neurological: Negative.  Negative for weakness.  Psychiatric/Behavioral: Negative for behavioral problems.    Past Medical History  Diagnosis Date  . Hyperlipidemia     takes Zetia and Simvastatin nightly  . Palpitation   . Carotid bruit     Right  . Edema   . Arthritis   . Allergy     portabella mushrooms/diarrhea  . Heart murmur   . Osteoporosis   . Fibromyalgia   . Thrush 03/23/2012  . Vaginitis 03/23/2012  . Sigmoid colon ulcer 03/23/2012  . Hyperglycemia 04/27/2012  . History of migraine     last about 68yrs ago  . Foot drop, right   . Joint pain   . Joint swelling   . Chronic back pain   . Skin problem in pregnancy     takes gabapentin 3 times a day and also has an ointment  . GERD (gastroesophageal reflux disease)     takes Zantac and Nexium daily  . Hemorrhoids   . Diverticulosis   . IBS (irritable bowel syndrome)     takes align daily  . Hypocalcemia 03/29/2013  . Depression with anxiety 03/29/2013    hx of  . Tachycardia 07/03/2013  . Onychomycosis 07/03/2013  . Adenomatous polyp of colon  1991, 2008  . Medicare annual wellness visit, subsequent 08/13/2014  . Dysrhythmia     paroxysmal A fib, irregular heartbeat;takes Bisoprolol nightly  . Migraine 04/27/2012    last 40 years ago  . Falls frequently     History   Social History  . Marital Status: Divorced    Spouse Name: N/A  . Number of Children: N/A  . Years of Education: N/A   Occupational History  . Not on file.   Social History Main Topics  . Smoking status: Former Smoker -- 0.25 packs/day for 4 years    Types: Cigarettes  . Smokeless tobacco: Never Used     Comment: quit 20+yrs ago  . Alcohol Use: Yes     Comment: WINE WITH DINNER. 2 GLASSES RED WINE  . Drug Use: No  . Sexual Activity: No   Other Topics Concern  . Not on file   Social History Narrative    Past Surgical History  Procedure Laterality Date  . Abdominal hysterectomy  1981  . Lumbar laminectomy  1960    x 2  . Veins stripped  1970  . Total hip arthroplasty Left 2012  . Carpal tunnel release Left 2012    ulnar nerve release at elbow  . Cervical fusion  2003, 2005, 2013  . Tonsillectomy  1943  . Left elbow surgery  06/03/11    cyst removed  . Colonoscopy    . Posterior cervical fusion/foraminotomy  08/03/2012  Procedure: POSTERIOR CERVICAL FUSION/FORAMINOTOMY LEVEL 5;  Surgeon: [name], MD;  Location: [place] [facility];  Service: Neurosurgery;  Laterality: N/A;  Cervical four to Thoracic two Posterior cervical decompression with Facet and Pedicle screw fixation  . Back surgery  1997    reconstructive lower back surgery  . Wrist surgery Right 1990's    cyst removed  . Eye surgery  2015    b/l cataracts removed  . Total knee arthroplasty Right 11/20/2014    Procedure: RIGHT TOTAL KNEE ARTHROPLASTY;  Surgeon: [name], MD;  Location: [facility];  Service: Orthopedics;  Laterality: Right;    Family History  Problem Relation Age of Onset  . Bipolar disorder Mother   . Stomach cancer Mother   . Mental illness Mother   .  Stroke Father   . Colon cancer Father   . Throat cancer Maternal Uncle     larynx  . HIV Son     Aids  . Anesthesia problems Neg Hx   . Stomach cancer Maternal Grandfather   . Stomach cancer Paternal Grandmother   . Anxiety disorder Mother   . Diabetes Neg Hx   . Heart disease Maternal Grandmother     Allergies  Allergen Reactions  . Cefuroxime Axetil Anaphylaxis  . Macrodantin Anaphylaxis  . Alendronate Sodium Other (See Comments)    Severe chest pain similar to heart attack   . Codeine Nausea And Vomiting  . Demerol [Meperidine] Other (See Comments)    hallucinations  . Meperidine Hcl Nausea And Vomiting  . Other Other (See Comments)    Hismanal and Maprobamate  portobello mushrooms - diarrhea  . Sulfonamide Derivatives Hives, Itching and Swelling    Current Outpatient Prescriptions on File Prior to Visit  Medication Sig Dispense Refill  . Biotin 5000 MCG TABS Take 5,000 mcg by mouth daily.    . bisoprolol (ZEBETA) 5 MG tablet TAKE 1 TABLET BY MOUTH TWICE DAILY 60 tablet 2  . Calcium Carb-Cholecalciferol 600-200 MG-UNIT TABS Take 1 tablet by mouth daily.    Marland Kitchen CARTIA XT 120 MG 24 hr capsule Take 120 mg by mouth every morning.     . citalopram (CELEXA) 10 MG tablet TAKE 1 TABLET BY MOUTH EVERY DAY (Patient taking differently: Take one tablet by mouth every morning.) 30 tablet 5  . clobetasol ointment (TEMOVATE) 3.79 % Apply 1 application topically 2 (two) times daily as needed (for itching).    . Eszopiclone 3 MG TABS Take 1 tablet (3 mg total) by mouth at bedtime as needed. Take immediately before bedtime 30 tablet 1  . gabapentin (NEURONTIN) 600 MG tablet TAKE 1 TABLET BY MOUTH THREE TIMES DAILY 90 tablet 4  . HYDROcodone-acetaminophen (NORCO/VICODIN) 5-325 MG per tablet Take 1 tablet by mouth every 6 (six) hours as needed for moderate pain or severe pain.   0  . hydrOXYzine (ATARAX/VISTARIL) 10 MG tablet Take 1 tablet (10 mg total) by mouth daily.    Marland Kitchen MEGARED OMEGA-3  KRILL OIL 500 MG CAPS Take 1 tablet by mouth daily.    . Multiple Vitamins-Minerals (CENTRUM SILVER ULTRA WOMENS) TABS Take 1 tablet by mouth daily.    Marland Kitchen NEXIUM 40 MG capsule TAKE ONE CAPSULE BY MOUTH EVERY DAY AT 2:00 PM (Patient taking differently: TAKE ONE CAPSULE BY MOUTH EVERY DAY AT BEDTIME) 30 capsule 4  . nortriptyline (PAMELOR) 25 MG capsule Take 2 capsules (50 mg total) by mouth at bedtime. 60 capsule 1  . ranitidine (ZANTAC) 300 MG tablet TAKE  1 TABLET BY MOUTH AT BEDTIME 30 tablet 5  . simvastatin (ZOCOR) 40 MG tablet TAKE 1 TABLET BY MOUTH EVERY EVENING 30 tablet 4  . warfarin (COUMADIN) 5 MG tablet Take as directed by coumadin clinic    . ZETIA 10 MG tablet TAKE 1 TABLET BY MOUTH EVERY DAY (Patient taking differently: Take one tablet by mouth nightly at bedtime.) 30 tablet 5   No current facility-administered medications on file prior to visit.    BP 144/69 mmHg  Pulse 57  Temp(Src) 98.2 F (36.8 C) (Oral)  Ht 5\' 3"  (1.6 m)  Wt 146 lb (66.225 kg)  BMI 25.87 kg/m2  SpO2 97%       Objective:   Physical Exam   General  Mental Status- Alert. Orientation- Orientation x 4.   Skin General:- Normal. Moisture- Dry. Temperature- Warm.  HEENT Head- normal.  Neck Neck- Supple.  Heart Ausculation-RRR  Lungs Ausculation- Clear, even, unlabored bilaterlly.    Abdomen Palpation/Percussion: Palpation and Percussion of the abdomen reveal- Non Tender, No Rebound tenderness, No Rigidity(guarding), No Palpable abdominal masses and No jar tenderness. No suprapubic tenderness. Liver:-Normal. Spleen:- Normal. Other Characteristics- No Costovertebral angle tenderness- Left or Costovertebral angle tenderness- Right.  Auscultation: Auscultation of the abdomen reveals- Bowel Sounds normal.  Back- no cva tenderness     Assessment & Plan:

## 2015-01-26 NOTE — Progress Notes (Signed)
Pre visit review using our clinic review tool, if applicable. No additional management support is needed unless otherwise documented below in the visit note. 

## 2015-01-26 NOTE — Patient Instructions (Signed)
UTI (urinary tract infection) Your appear to have a urinary tract infection. I am prescribing antibiotic cipro for the probable infection. Hydrate well. I am sending out a urine culture. During the interim if your signs and symptoms worsen rather than improving please notify us. We will notify your when the culture results are back.  Follow up in 7 days or as needed.    Will rx low dose pending urine culture. Also some potential effects on Inr. But pt allergic to various antibiotic. So benefit exceeds. Risk. If culuture positive may need to extend course then get INR after tx.

## 2015-01-26 NOTE — Addendum Note (Signed)
Addended by: Bunnie Domino on: 01/26/2015 02:52 PM   Modules accepted: Orders

## 2015-01-27 LAB — URINE CULTURE: Colony Count: 70000

## 2015-01-28 ENCOUNTER — Telehealth: Payer: Self-pay | Admitting: Medical

## 2015-01-28 NOTE — Telephone Encounter (Signed)
Note regarding elevated sed rate to Dr. Randel Pigg.

## 2015-01-28 NOTE — Telephone Encounter (Signed)
On Friday talked with pharmacist. We discussed qt prolongation with cipro. And her med allergy hx. Pt had been on augmentin in the past. Pt got med filled at her pharmacy. Pt told pharmacist no rxn to  augmentin  so cipro was not filled. Rx of augmentin given/called in.

## 2015-01-29 ENCOUNTER — Encounter: Payer: Self-pay | Admitting: Family Medicine

## 2015-01-29 ENCOUNTER — Ambulatory Visit (INDEPENDENT_AMBULATORY_CARE_PROVIDER_SITE_OTHER): Payer: Medicare Other | Admitting: Family Medicine

## 2015-01-29 VITALS — BP 142/72 | HR 62 | Temp 97.9°F | Ht 64.0 in | Wt 147.1 lb

## 2015-01-29 DIAGNOSIS — D649 Anemia, unspecified: Secondary | ICD-10-CM

## 2015-01-29 DIAGNOSIS — E782 Mixed hyperlipidemia: Secondary | ICD-10-CM

## 2015-01-29 DIAGNOSIS — R739 Hyperglycemia, unspecified: Secondary | ICD-10-CM

## 2015-01-29 DIAGNOSIS — I10 Essential (primary) hypertension: Secondary | ICD-10-CM

## 2015-01-29 DIAGNOSIS — R413 Other amnesia: Secondary | ICD-10-CM

## 2015-01-29 DIAGNOSIS — E46 Unspecified protein-calorie malnutrition: Secondary | ICD-10-CM

## 2015-01-29 DIAGNOSIS — N179 Acute kidney failure, unspecified: Secondary | ICD-10-CM

## 2015-01-29 DIAGNOSIS — N3000 Acute cystitis without hematuria: Secondary | ICD-10-CM | POA: Diagnosis not present

## 2015-01-29 DIAGNOSIS — R000 Tachycardia, unspecified: Secondary | ICD-10-CM

## 2015-01-29 DIAGNOSIS — G4700 Insomnia, unspecified: Secondary | ICD-10-CM

## 2015-01-29 DIAGNOSIS — E785 Hyperlipidemia, unspecified: Secondary | ICD-10-CM

## 2015-01-29 DIAGNOSIS — M1711 Unilateral primary osteoarthritis, right knee: Secondary | ICD-10-CM | POA: Diagnosis not present

## 2015-01-29 MED ORDER — ESZOPICLONE 2 MG PO TABS: 2.0000 mg | ORAL_TABLET | Freq: Every evening | ORAL | Status: DC | PRN

## 2015-01-29 NOTE — Patient Instructions (Signed)
Increase protein in diet, increase fluids, club soda  Dementia Dementia is a general term for problems with brain function. A person with dementia has memory loss and a hard time with at least one other brain function such as thinking, speaking, or problem solving. Dementia can affect social functioning, how you do your job, your mood, or your personality. The changes may be hidden for a long time. The earliest forms of this disease are usually not detected by family or friends. Dementia can be:  Irreversible.  Potentially reversible.  Partially reversible.  Progressive. This means it can get worse over time. CAUSES  Irreversible dementia causes may include:  Degeneration of brain cells (Alzheimer disease or Lewy body dementia).  Multiple small strokes (vascular dementia).  Infection (chronic meningitis or Creutzfeldt-Jakob disease).  Frontotemporal dementia. This affects younger people, age 70 to 74, compared to those who have Alzheimer disease.  Dementia associated with other disorders like Parkinson disease, Huntington disease, or HIV-associated dementia. Potentially or partially reversible dementia causes may include:  Medicines.  Metabolic causes such as excessive alcohol intake, vitamin B12 deficiency, or thyroid disease.  Masses or pressure in the brain such as a tumor, blood clot, or hydrocephalus. SIGNS AND SYMPTOMS  Symptoms are often hard to detect. Family members or coworkers may not notice them early in the disease process. Different people with dementia may have different symptoms. Symptoms can include:  A hard time with memory, especially recent memory. Long-term memory may not be impaired.  Asking the same question multiple times or forgetting something someone just said.  A hard time speaking your thoughts or finding certain words.  A hard time solving problems or performing familiar tasks (such as how to use a telephone).  Sudden changes in  mood.  Changes in personality, especially increasing moodiness or mistrust.  Depression.  A hard time understanding complex ideas that were never a problem in the past. DIAGNOSIS  There are no specific tests for dementia.   Your health care provider may recommend a thorough evaluation. This is because some forms of dementia can be reversible. The evaluation will likely include a physical exam and getting a detailed history from you and a family member. The history often gives the best clues and suggestions for a diagnosis.  Memory testing may be done. A detailed brain function evaluation called neuropsychologic testing may be helpful.  Lab tests and brain imaging (such as a CT scan or MRI scan) are sometimes important.  Sometimes observation and re-evaluation over time is very helpful. TREATMENT  Treatment depends on the cause.   If the problem is a vitamin deficiency, it may be helped or cured with supplements.  For dementias such as Alzheimer disease, medicines are available to stabilize or slow the course of the disease. There are no cures for this type of dementia.  Your health care provider can help direct you to groups, organizations, and other health care providers to help with decisions in the care of you or your loved one. HOME CARE INSTRUCTIONS The care of individuals with dementia is varied and dependent upon the progression of the dementia. The following suggestions are intended for the person living with, or caring for, the person with dementia.  Create a safe environment.  Remove the locks on bathroom doors to prevent the person from accidentally locking himself or herself in.  Use childproof latches on kitchen cabinets and any place where cleaning supplies, chemicals, or alcohol are kept.  Use childproof covers in unused electrical outlets.  Install childproof devices to keep doors and windows secured.  Remove stove knobs or install safety knobs and an automatic  shut-off on the stove.  Lower the temperature on water heaters.  Label medicines and keep them locked up.  Secure knives, lighters, matches, power tools, and guns, and keep these items out of reach.  Keep the house free from clutter. Remove rugs or anything that might contribute to a fall.  Remove objects that might break and hurt the person.  Make sure lighting is good, both inside and outside.  Install grab rails as needed.  Use a monitoring device to alert you to falls or other needs for help.  Reduce confusion.  Keep familiar objects and people around.  Use night lights or dim lights at night.  Label items or areas.  Use reminders, notes, or directions for daily activities or tasks.  Keep a simple, consistent routine for waking, meals, bathing, dressing, and bedtime.  Create a calm, quiet environment.  Place large clocks and calendars prominently.  Display emergency numbers and home address near all telephones.  Use cues to establish different times of the day. An example is to open curtains to let the natural light in during the day.   Use effective communication.  Choose simple words and short sentences.  Use a gentle, calm tone of voice.  Be careful not to interrupt.  If the person is struggling to find a word or communicate a thought, try to provide the word or thought.  Ask one question at a time. Allow the person ample time to answer questions. Repeat the question again if the person does not respond.  Reduce nighttime restlessness.  Provide a comfortable bed.  Have a consistent nighttime routine.  Ensure a regular walking or physical activity schedule. Involve the person in daily activities as much as possible.  Limit napping during the day.  Limit caffeine.  Attend social events that stimulate rather than overwhelm the senses.  Encourage good nutrition and hydration.  Reduce distractions during meal times and snacks.  Avoid foods that  are too hot or too cold.  Monitor chewing and swallowing ability.  Continue with routine vision, hearing, dental, and medical screenings.  Give medicines only as directed by the health care provider.  Monitor driving abilities. Do not allow the person to drive when safe driving is no longer possible.  Register with an identification program which could provide location assistance in the event of a missing person situation. SEEK MEDICAL CARE IF:   New behavioral problems start such as moodiness, aggressiveness, or seeing things that are not there (hallucinations).  Any new problem with brain function happens. This includes problems with balance, speech, or falling a lot.  Problems with swallowing develop.  Any symptoms of other illness happen. Small changes or worsening in any aspect of brain function can be a sign that the illness is getting worse. It can also be a sign of another medical illness such as infection. Seeing a health care provider right away is important. SEEK IMMEDIATE MEDICAL CARE IF:   A fever develops.  New or worsened confusion develops.  New or worsened sleepiness develops.  Staying awake becomes hard to do. Document Released: 03/04/2001 Document Revised: 01/23/2014 Document Reviewed: 02/03/2011 [facility] Patient Information 2015 [place], [place]. This information is not intended to replace advice given to you by your health care provider. Make sure you discuss any questions you have with your health care provider.

## 2015-01-29 NOTE — Progress Notes (Signed)
Pre visit review using our clinic review tool, if applicable. No additional management support is needed unless otherwise documented below in the visit note. 

## 2015-01-30 ENCOUNTER — Other Ambulatory Visit: Payer: Self-pay | Admitting: Family Medicine

## 2015-01-30 ENCOUNTER — Other Ambulatory Visit (INDEPENDENT_AMBULATORY_CARE_PROVIDER_SITE_OTHER): Payer: Medicare Other

## 2015-01-30 ENCOUNTER — Telehealth: Payer: Self-pay | Admitting: *Deleted

## 2015-01-30 DIAGNOSIS — R739 Hyperglycemia, unspecified: Secondary | ICD-10-CM

## 2015-01-30 DIAGNOSIS — N390 Urinary tract infection, site not specified: Secondary | ICD-10-CM

## 2015-01-30 DIAGNOSIS — R82998 Other abnormal findings in urine: Secondary | ICD-10-CM

## 2015-01-30 LAB — COMPREHENSIVE METABOLIC PANEL
ALT: 17 U/L (ref 0–35)
AST: 21 U/L (ref 0–37)
Albumin: 4.2 g/dL (ref 3.5–5.2)
Alkaline Phosphatase: 83 U/L (ref 39–117)
BUN: 15 mg/dL (ref 6–23)
CO2: 27 mEq/L (ref 19–32)
Calcium: 9.9 mg/dL (ref 8.4–10.5)
Chloride: 104 mEq/L (ref 96–112)
Creatinine, Ser: 0.69 mg/dL (ref 0.40–1.20)
GFR: 87.37 mL/min (ref >60.00)
Glucose, Bld: 118 mg/dL — ABNORMAL HIGH (ref 70–99)
Potassium: 3.6 mEq/L (ref 3.5–5.1)
Sodium: 139 mEq/L (ref 135–145)
Total Bilirubin: 0.2 mg/dL (ref 0.2–1.2)
Total Protein: 7.2 g/dL (ref 6.0–8.3)

## 2015-01-30 LAB — CBC WITH DIFFERENTIAL/PLATELET
Basophils Absolute: 0.1 10*3/uL (ref 0.0–0.1)
Basophils Relative: 1.3 % (ref 0.0–3.0)
Eosinophils Absolute: 0.1 10*3/uL (ref 0.0–0.7)
Eosinophils Relative: 1.5 % (ref 0.0–5.0)
HCT: 39.6 % (ref 36.0–46.0)
Hemoglobin: 13.2 g/dL (ref 12.0–15.0)
Lymphocytes Relative: 23.6 % (ref 12.0–46.0)
Lymphs Abs: 2 10*3/uL (ref 0.7–4.0)
MCHC: 33.3 g/dL (ref 30.0–36.0)
MCV: 87.8 fl (ref 78.0–100.0)
Monocytes Absolute: 0.4 10*3/uL (ref 0.1–1.0)
Monocytes Relative: 5.2 % (ref 3.0–12.0)
Neutro Abs: 5.8 10*3/uL (ref 1.4–7.7)
Neutrophils Relative %: 68.4 % (ref 43.0–77.0)
Platelets: 237 10*3/uL (ref 150.0–400.0)
RBC: 4.51 Mil/uL (ref 3.87–5.11)
RDW: 15 % (ref 11.5–15.5)
WBC: 8.4 10*3/uL (ref 4.0–10.5)

## 2015-01-30 LAB — URINALYSIS, ROUTINE W REFLEX MICROSCOPIC
Bilirubin Urine: NEGATIVE
Ketones, ur: NEGATIVE
Leukocytes, UA: NEGATIVE
Nitrite: NEGATIVE
Specific Gravity, Urine: 1.025 (ref 1.000–1.030)
Total Protein, Urine: NEGATIVE
Urine Glucose: NEGATIVE
Urobilinogen, UA: 0.2 (ref 0.0–1.0)
pH: 6 (ref 5.0–8.0)

## 2015-01-30 LAB — HEMOGLOBIN A1C: Hgb A1c MFr Bld: 5.9 % (ref 4.6–6.5)

## 2015-01-30 NOTE — Telephone Encounter (Signed)
This information noted. Thank you

## 2015-01-30 NOTE — Telephone Encounter (Signed)
-----   Message from Darlin Coco, MD sent at 01/26/2015 10:03 AM EDT ----- Agree with plan.  We will need to use warfarin for now. If in the future we are able to document atrial fibrillation then we can revisit the idea of a NOAC. TB ----- Message -----    From: Margretta Sidle, RN    Sent: 01/25/2015   3:29 PM      To: Earvin Hansen, Darlin Coco, MD  Dr [name] Saw this pt today in coumadin clinic and her orders from the hospital were to hold coumadin see [place] in the coumadin clinic on 01/25/2015 and if INR less than 2.0 to start her on Eliquis 5mg  bid . She states her insurance was checked in the hospital and they said would pay for the Eliquis but the pt states she was on Eliquis in the past and the insurance company states they would not cover the  Eliquis because her diagnosis is Atrial Flutter. Spoke with Elberta Leatherwood and she states that is correct would not techinally  cover the diagnosis of Atrial Flutter. Did call her Pharmacy and they said would have to have prior approval and would have to use diagnosis of Atrial Flutter so because of this restarted her on coumadin  Thank you Elbert Ewings RN

## 2015-01-31 DIAGNOSIS — M1711 Unilateral primary osteoarthritis, right knee: Secondary | ICD-10-CM | POA: Diagnosis not present

## 2015-01-31 LAB — URINE CULTURE
Colony Count: NO GROWTH
Organism ID, Bacteria: NO GROWTH

## 2015-02-01 ENCOUNTER — Ambulatory Visit (INDEPENDENT_AMBULATORY_CARE_PROVIDER_SITE_OTHER): Payer: Medicare Other | Admitting: *Deleted

## 2015-02-01 ENCOUNTER — Encounter: Payer: Self-pay | Admitting: Family Medicine

## 2015-02-01 ENCOUNTER — Ambulatory Visit: Payer: Medicare Other | Admitting: Family Medicine

## 2015-02-01 DIAGNOSIS — I4892 Unspecified atrial flutter: Secondary | ICD-10-CM

## 2015-02-01 LAB — POCT INR: INR: 2

## 2015-02-05 DIAGNOSIS — M1711 Unilateral primary osteoarthritis, right knee: Secondary | ICD-10-CM | POA: Diagnosis not present

## 2015-02-06 DIAGNOSIS — Z8739 Personal history of other diseases of the musculoskeletal system and connective tissue: Secondary | ICD-10-CM | POA: Diagnosis not present

## 2015-02-06 DIAGNOSIS — M1711 Unilateral primary osteoarthritis, right knee: Secondary | ICD-10-CM | POA: Diagnosis not present

## 2015-02-06 DIAGNOSIS — Z96651 Presence of right artificial knee joint: Secondary | ICD-10-CM | POA: Diagnosis not present

## 2015-02-06 DIAGNOSIS — Z471 Aftercare following joint replacement surgery: Secondary | ICD-10-CM | POA: Diagnosis not present

## 2015-02-08 ENCOUNTER — Ambulatory Visit: Payer: Medicare Other | Admitting: Family Medicine

## 2015-02-08 ENCOUNTER — Ambulatory Visit (INDEPENDENT_AMBULATORY_CARE_PROVIDER_SITE_OTHER): Payer: Medicare Other | Admitting: Cardiology

## 2015-02-08 ENCOUNTER — Encounter: Payer: Self-pay | Admitting: Cardiology

## 2015-02-08 VITALS — BP 122/64 | HR 51 | Ht 63.0 in | Wt 147.8 lb

## 2015-02-08 DIAGNOSIS — I10 Essential (primary) hypertension: Secondary | ICD-10-CM

## 2015-02-08 DIAGNOSIS — Z7901 Long term (current) use of anticoagulants: Secondary | ICD-10-CM

## 2015-02-08 DIAGNOSIS — I4892 Unspecified atrial flutter: Secondary | ICD-10-CM

## 2015-02-08 DIAGNOSIS — M797 Fibromyalgia: Secondary | ICD-10-CM | POA: Diagnosis not present

## 2015-02-08 DIAGNOSIS — M159 Polyosteoarthritis, unspecified: Secondary | ICD-10-CM | POA: Diagnosis not present

## 2015-02-08 DIAGNOSIS — M810 Age-related osteoporosis without current pathological fracture: Secondary | ICD-10-CM | POA: Diagnosis not present

## 2015-02-08 NOTE — Progress Notes (Signed)
Cardiology Office Note   Date:  02/08/2015   ID:  Chelsea Hancock, DOB May 27, 1934, MRN [id number]  PCP:  Penni Homans, MD  Cardiologist: Darlin Coco MD  No chief complaint on file.     History of Present Illness: Chelsea Hancock is a 81 y.o. female who presents for follow-up office visit.  this 81 year old woman is seen for a scheduled follow-up office visit. He has a past history of paroxysmal atrial flutter.  She has had no recurrent atrial flutter since last visit.  She previously had been on Eliquis but her insurance would not pay and therefore she is now on warfarin.  She has had a lot of variation in her INR because of recent antibiotic therapy for urinary tract infections etc.  the patient has normal left ventricular ejection fraction by echocardiogram. She is not physically active. In February 2016 the patient underwent a right total knee replacement by Dr.Aluisio.  She did not have any arrhythmia problems during surgery but did have problems with low blood pressure. The patient was admitted to the hospital May 1 and discharge 01/23/2015 for weakness dehydration and low blood pressure.  Past Medical History  Diagnosis Date  . Hyperlipidemia     takes Zetia and Simvastatin nightly  . Palpitation   . Carotid bruit     Right  . Edema   . Arthritis   . Allergy     portabella mushrooms/diarrhea  . Heart murmur   . Osteoporosis   . Fibromyalgia   . Thrush 03/23/2012  . Vaginitis 03/23/2012  . Sigmoid colon ulcer 03/23/2012  . Hyperglycemia 04/27/2012  . History of migraine     last about 61yrs ago  . Foot drop, right   . Joint pain   . Joint swelling   . Chronic back pain   . Skin problem in pregnancy     takes gabapentin 3 times a day and also has an ointment  . GERD (gastroesophageal reflux disease)     takes Zantac and Nexium daily  . Hemorrhoids   . Diverticulosis   . IBS (irritable bowel syndrome)     takes align daily  . Hypocalcemia  03/29/2013  . Depression with anxiety 03/29/2013    hx of  . Tachycardia 07/03/2013  . Onychomycosis 07/03/2013  . Adenomatous polyp of colon 1991, 2008  . Medicare annual wellness visit, subsequent 08/13/2014  . Dysrhythmia     paroxysmal A fib, irregular heartbeat;takes Bisoprolol nightly  . Migraine 04/27/2012    last 40 years ago  . Falls frequently     Past Surgical History  Procedure Laterality Date  . Abdominal hysterectomy  1981  . Lumbar laminectomy  1960    x 2  . Veins stripped  1970  . Total hip arthroplasty Left 2012  . Carpal tunnel release Left 2012    ulnar nerve release at elbow  . Cervical fusion  2003, 2005, 2013  . Tonsillectomy  1943  . Left elbow surgery  06/03/11    cyst removed  . Colonoscopy    . Posterior cervical fusion/foraminotomy  08/03/2012    Procedure: POSTERIOR CERVICAL FUSION/FORAMINOTOMY LEVEL 5;  Surgeon: [name], MD;  Location: [facility];  Service: Neurosurgery;  Laterality: N/A;  Cervical four to Thoracic two Posterior cervical decompression with Facet and Pedicle screw fixation  . Back surgery  1997    reconstructive lower back surgery  . Wrist surgery Right 1990's    cyst removed  . Eye  surgery  2015    b/l cataracts removed  . Total knee arthroplasty Right 11/20/2014    Procedure: RIGHT TOTAL KNEE ARTHROPLASTY;  Surgeon: [name], MD;  Location: [facility];  Service: Orthopedics;  Laterality: Right;     Current Outpatient Prescriptions  Medication Sig Dispense Refill  . Biotin 5000 MCG TABS Take 5,000 mcg by mouth daily.    . bisoprolol (ZEBETA) 5 MG tablet Take 5 mg by mouth daily.    . Calcium Carb-Cholecalciferol 600-200 MG-UNIT TABS Take 1 tablet by mouth daily.    Marland Kitchen CARTIA XT 120 MG 24 hr capsule Take 120 mg by mouth every morning.     . citalopram (CELEXA) 10 MG tablet Take 10 mg by mouth daily.    . clobetasol ointment (TEMOVATE) 0.25 % Apply 1 application topically 2 (two) times daily as needed (for itching).    Marland Kitchen  esomeprazole (NEXIUM) 40 MG capsule Take 40 mg by mouth daily at 12 noon.    . eszopiclone (LUNESTA) 2 MG TABS tablet Take 1 tablet (2 mg total) by mouth at bedtime as needed for sleep. Take immediately before bedtime 30 tablet 0  . ezetimibe (ZETIA) 10 MG tablet Take 10 mg by mouth daily.    Marland Kitchen gabapentin (NEURONTIN) 600 MG tablet TAKE 1 TABLET BY MOUTH THREE TIMES DAILY 90 tablet 4  . MEGARED OMEGA-3 KRILL OIL 500 MG CAPS Take 1 tablet by mouth daily.    . Multiple Vitamins-Minerals (CENTRUM SILVER ULTRA WOMENS) TABS Take 1 tablet by mouth daily.    . nortriptyline (PAMELOR) 25 MG capsule Take 2 capsules (50 mg total) by mouth at bedtime. 60 capsule 1  . ranitidine (ZANTAC) 300 MG tablet TAKE 1 TABLET BY MOUTH AT BEDTIME 30 tablet 5  . simvastatin (ZOCOR) 40 MG tablet TAKE 1 TABLET BY MOUTH EVERY EVENING 30 tablet 4  . warfarin (COUMADIN) 5 MG tablet Take as directed by coumadin clinic     No current facility-administered medications for this visit.    Allergies:   Cefuroxime axetil; Macrodantin; Alendronate sodium; Codeine; Demerol; Meperidine hcl; Other; and Sulfonamide derivatives    Social History:  The patient  reports that she has quit smoking. Her smoking use included Cigarettes. She has a 1 pack-year smoking history. She has never used smokeless tobacco. She reports that she drinks alcohol. She reports that she does not use illicit drugs.   Family History:  The patient's family history includes Anxiety disorder in her mother; Bipolar disorder in her mother; Colon cancer in her father; HIV in her son; Heart disease in her maternal grandmother; Mental illness in her mother; Stomach cancer in her maternal grandfather, mother, and paternal grandmother; Stroke in her father; Throat cancer in her maternal uncle. There is no history of Anesthesia problems or Diabetes.    ROS:  Please see the history of present illness.   Otherwise, review of systems are positive for none.   All other systems  are reviewed and negative.    PHYSICAL EXAM: VS:  BP 122/64 mmHg  Pulse 51  Ht 5\' 3"  (1.6 m)  Wt 147 lb 12.8 oz (67.042 kg)  BMI 26.19 kg/m2 , BMI Body mass index is 26.19 kg/(m^2). GEN: Well nourished, well developed, in no acute distress HEENT: normal Neck: no JVD, carotid bruits, or masses Cardiac: RRR; no murmurs, rubs, or gallops,no edema  Respiratory:  clear to auscultation bilaterally, normal work of breathing GI: soft, nontender, nondistended, + BS MS: no deformity or atrophy Skin:  warm and dry, no rash Neuro:  Strength and sensation are intact Psych: euthymic mood, full affect   EKG:  EKG is not ordered today.    Recent Labs: 08/03/2014: TSH 1.77 01/21/2015: Magnesium 1.7 01/29/2015: ALT 17; BUN 15; Creatinine 0.69; Hemoglobin 13.2; Platelets 237.0; Potassium 3.6; Sodium 139    Lipid Panel    Component Value Date/Time   CHOL 187 08/03/2014 1053   TRIG 150.0* 08/03/2014 1053   TRIG 133 06/30/2006 1110   HDL 45.80 08/03/2014 1053   CHOLHDL 4 08/03/2014 1053   CHOLHDL 3.1 CALC 06/30/2006 1110   VLDL 30.0 08/03/2014 1053   LDLCALC 111* 08/03/2014 1053   LDLDIRECT 93.3 08/04/2008 0915      Wt Readings from Last 3 Encounters:  02/08/15 147 lb 12.8 oz (67.042 kg)  01/29/15 147 lb 2 oz (66.735 kg)  01/26/15 146 lb (66.225 kg)       ASSESSMENT AND PLAN:  1. Atrial flutter. No recurrence. Her Chadds vascular score is 4 and she will remain on long-term anticoagulation, switched from Eliquis to warfarin because of cost factors 2. HLD - On statin therapy  3. Hypothyroidism  4. Osteoarthritis of right knee  5.  Sinus bradycardia and malaise/fatigue  Disposition: We will reduce her bisoprolol to just 5 mg once a day rather than twice a day.  recheck here for follow-up office visit and EKG in 3 months , or sooner if necessary   Current medicines are reviewed at length with the patient today.  The patient does not have concerns regarding medicines.  The  following changes have been made:    Labs/ tests ordered today include:  No orders of the defined types were placed in this encounter.       Berna Spare MD 02/08/2015 11:50 AM    [address], [place], [place]  [address] Phone: [phone]; Fax: [phone]

## 2015-02-08 NOTE — Patient Instructions (Signed)
Medication Instructions:  DECREASE YOUR BISOPROLOL TO ONCE DAILY   Labwork: NONE  Testing/Procedures: NONE  Follow-Up: Your physician recommends that you schedule a follow-up appointment in: 3 MONTH OV/EKG

## 2015-02-11 ENCOUNTER — Encounter: Payer: Self-pay | Admitting: Family Medicine

## 2015-02-11 DIAGNOSIS — D649 Anemia, unspecified: Secondary | ICD-10-CM | POA: Insufficient documentation

## 2015-02-11 DIAGNOSIS — E46 Unspecified protein-calorie malnutrition: Secondary | ICD-10-CM | POA: Insufficient documentation

## 2015-02-11 DIAGNOSIS — R413 Other amnesia: Secondary | ICD-10-CM

## 2015-02-11 DIAGNOSIS — G4700 Insomnia, unspecified: Secondary | ICD-10-CM | POA: Insufficient documentation

## 2015-02-11 HISTORY — DX: Other amnesia: R41.3

## 2015-02-11 NOTE — Assessment & Plan Note (Signed)
RRR today, asyptomatic

## 2015-02-11 NOTE — Assessment & Plan Note (Signed)
Tolerating statin, encouraged heart healthy diet, avoid trans fats, minimize simple carbs and saturated fats. Increase exercise as tolerated 

## 2015-02-11 NOTE — Assessment & Plan Note (Signed)
hgba1c acceptable, minimize simple carbs. Increase exercise as tolerated. Continue current meds 

## 2015-02-11 NOTE — Assessment & Plan Note (Signed)
Patient in today accompanied by her daughter. They are concerned that her short term memory is progressively worsening. Her daughter is more concerned than she is but she acknowledges she has been worsening and this has increased her anxiety levels as well. They agree to start titrating off meds that may be contributing and would like to undergo neurologic evaluation, is referred to neurology

## 2015-02-11 NOTE — Assessment & Plan Note (Signed)
Encouraged increased po intake and especially protein sources. Consider a nutrition consult if unable to achieve will monitor

## 2015-02-11 NOTE — Progress Notes (Signed)
Chelsea Hancock  [id number] 06/08/1934 02/11/2015      Progress Note-Follow Up  Subjective  Chief Complaint  Chief Complaint  Patient presents with  . Hospitalization Follow-up    HPI  Patient is a 82 y.o. female in today for routine medical care. Patient is here today accompanied by her daughter for hospital follow-up. She was recently hospitalized with urinary tract infection, dehydration, AKI and hypotension. She acknowledges ongoing fatigue and malaise. No dysuria. No fevers, chills. No abdominal pain or back pain. They acknowledge she has been struggling with increased anxiety and depression as her memory has gotten worse. She still lives alone and reports when she is feeling well she is able to perform her ADLs well abut when she is sick she struggles. She is not eating or hydrating well. Has been following with [facility] and has undergone PT which has been helpful. She notes some episodes of feeling light headed when arising quickly but no falls or syncope.  Past Medical History  Diagnosis Date  . Hyperlipidemia     takes Zetia and Simvastatin nightly  . Palpitation   . Carotid bruit     Right  . Edema   . Arthritis   . Allergy     portabella mushrooms/diarrhea  . Heart murmur   . Osteoporosis   . Fibromyalgia   . Thrush 03/23/2012  . Vaginitis 03/23/2012  . Sigmoid colon ulcer 03/23/2012  . Hyperglycemia 04/27/2012  . History of migraine     last about 51yrs ago  . Foot drop, right   . Joint pain   . Joint swelling   . Chronic back pain   . Skin problem in pregnancy     takes gabapentin 3 times a day and also has an ointment  . GERD (gastroesophageal reflux disease)     takes Zantac and Nexium daily  . Hemorrhoids   . Diverticulosis   . IBS (irritable bowel syndrome)     takes align daily  . Hypocalcemia 03/29/2013  . Depression with anxiety 03/29/2013    hx of  . Tachycardia 07/03/2013  . Onychomycosis 07/03/2013  . Adenomatous polyp of colon 1991, 2008    . Medicare annual wellness visit, subsequent 08/13/2014  . Dysrhythmia     paroxysmal A fib, irregular heartbeat;takes Bisoprolol nightly  . Migraine 04/27/2012    last 40 years ago  . Falls frequently   . Memory loss 02/11/2015    Past Surgical History  Procedure Laterality Date  . Abdominal hysterectomy  1981  . Lumbar laminectomy  1960    x 2  . Veins stripped  1970  . Total hip arthroplasty Left 2012  . Carpal tunnel release Left 2012    ulnar nerve release at elbow  . Cervical fusion  2003, 2005, 2013  . Tonsillectomy  1943  . Left elbow surgery  06/03/11    cyst removed  . Colonoscopy    . Posterior cervical fusion/foraminotomy  08/03/2012    Procedure: POSTERIOR CERVICAL FUSION/FORAMINOTOMY LEVEL 5;  Surgeon: [name], MD;  Location: [facility];  Service: Neurosurgery;  Laterality: N/A;  Cervical four to Thoracic two Posterior cervical decompression with Facet and Pedicle screw fixation  . Back surgery  1997    reconstructive lower back surgery  . Wrist surgery Right 1990's    cyst removed  . Eye surgery  2015    b/l cataracts removed  . Total knee arthroplasty Right 11/20/2014    Procedure: RIGHT TOTAL KNEE ARTHROPLASTY;  Surgeon: [name], MD;  Location: [facility];  Service: Orthopedics;  Laterality: Right;    Family History  Problem Relation Age of Onset  . Bipolar disorder Mother   . Stomach cancer Mother   . Mental illness Mother   . Stroke Father   . Colon cancer Father   . Throat cancer Maternal Uncle     larynx  . HIV Son     Aids  . Anesthesia problems Neg Hx   . Stomach cancer Maternal Grandfather   . Stomach cancer Paternal Grandmother   . Anxiety disorder Mother   . Diabetes Neg Hx   . Heart disease Maternal Grandmother     History   Social History  . Marital Status: Divorced    Spouse Name: N/A  . Number of Children: N/A  . Years of Education: N/A   Occupational History  . Not on file.   Social History Main Topics  . Smoking  status: Former Smoker -- 0.25 packs/day for 4 years    Types: Cigarettes  . Smokeless tobacco: Never Used     Comment: quit 20+yrs ago  . Alcohol Use: Yes     Comment: WINE WITH DINNER. 2 GLASSES RED WINE  . Drug Use: No  . Sexual Activity: No   Other Topics Concern  . Not on file   Social History Narrative    Current Outpatient Prescriptions on File Prior to Visit  Medication Sig Dispense Refill  . Biotin 5000 MCG TABS Take 5,000 mcg by mouth daily.    . Calcium Carb-Cholecalciferol 600-200 MG-UNIT TABS Take 1 tablet by mouth daily.    Marland Kitchen CARTIA XT 120 MG 24 hr capsule Take 120 mg by mouth every morning.     . clobetasol ointment (TEMOVATE) 1.32 % Apply 1 application topically 2 (two) times daily as needed (for itching).    . gabapentin (NEURONTIN) 600 MG tablet TAKE 1 TABLET BY MOUTH THREE TIMES DAILY 90 tablet 4  . MEGARED OMEGA-3 KRILL OIL 500 MG CAPS Take 1 tablet by mouth daily.    . Multiple Vitamins-Minerals (CENTRUM SILVER ULTRA WOMENS) TABS Take 1 tablet by mouth daily.    . nortriptyline (PAMELOR) 25 MG capsule Take 2 capsules (50 mg total) by mouth at bedtime. 60 capsule 1  . ranitidine (ZANTAC) 300 MG tablet TAKE 1 TABLET BY MOUTH AT BEDTIME 30 tablet 5  . simvastatin (ZOCOR) 40 MG tablet TAKE 1 TABLET BY MOUTH EVERY EVENING 30 tablet 4  . warfarin (COUMADIN) 5 MG tablet Take as directed by coumadin clinic     No current facility-administered medications on file prior to visit.    Allergies  Allergen Reactions  . Cefuroxime Axetil Anaphylaxis  . Macrodantin Anaphylaxis  . Alendronate Sodium Other (See Comments)    Severe chest pain similar to heart attack   . Codeine Nausea And Vomiting  . Demerol [Meperidine] Other (See Comments)    hallucinations  . Meperidine Hcl Nausea And Vomiting  . Other Other (See Comments)    Hismanal and Maprobamate  portobello mushrooms - diarrhea  . Sulfonamide Derivatives Hives, Itching and Swelling    Review of  Systems  Review of Systems  Constitutional: Positive for malaise/fatigue. Negative for fever.  HENT: Negative for congestion.   Eyes: Negative for discharge.  Respiratory: Negative for shortness of breath.   Cardiovascular: Negative for chest pain, palpitations and leg swelling.  Gastrointestinal: Negative for nausea, abdominal pain and diarrhea.  Genitourinary: Negative for dysuria.  Musculoskeletal: Negative  for falls.  Skin: Negative for rash.  Neurological: Positive for dizziness and weakness. Negative for loss of consciousness and headaches.  Endo/Heme/Allergies: Negative for polydipsia.  Psychiatric/Behavioral: Positive for depression and memory loss. Negative for suicidal ideas. The patient is nervous/anxious and has insomnia.     Objective  BP 142/72 mmHg  Pulse 62  Temp(Src) 97.9 F (36.6 C) (Oral)  Ht 5\' 4"  (1.626 m)  Wt 147 lb 2 oz (66.735 kg)  BMI 25.24 kg/m2  SpO2 97%  Physical Exam  Physical Exam  Constitutional: She is oriented to person, place, and time and well-developed, well-nourished, and in no distress. No distress.  HENT:  Head: Normocephalic and atraumatic.  Eyes: Conjunctivae are normal.  Neck: Neck supple. No thyromegaly present.  Cardiovascular: Normal rate, regular rhythm and normal heart sounds.   Pulmonary/Chest: Effort normal and breath sounds normal. She has no wheezes.  Abdominal: She exhibits no distension and no mass.  Musculoskeletal: She exhibits no edema.  Lymphadenopathy:    She has no cervical adenopathy.  Neurological: She is alert and oriented to person, place, and time.  Skin: Skin is warm and dry. No rash noted. She is not diaphoretic.  Psychiatric: Memory and judgment normal.  anxious    Lab Results  Component Value Date   TSH 1.77 08/03/2014   Lab Results  Component Value Date   WBC 8.4 01/29/2015   HGB 13.2 01/29/2015   HCT 39.6 01/29/2015   MCV 87.8 01/29/2015   PLT 237.0 01/29/2015   Lab Results  Component  Value Date   CREATININE 0.69 01/29/2015   BUN 15 01/29/2015   NA 139 01/29/2015   K 3.6 01/29/2015   CL 104 01/29/2015   CO2 27 01/29/2015   Lab Results  Component Value Date   ALT 17 01/29/2015   AST 21 01/29/2015   ALKPHOS 83 01/29/2015   BILITOT 0.2 01/29/2015   Lab Results  Component Value Date   CHOL 187 08/03/2014   Lab Results  Component Value Date   HDL 45.80 08/03/2014   Lab Results  Component Value Date   LDLCALC 111* 08/03/2014   Lab Results  Component Value Date   TRIG 150.0* 08/03/2014   Lab Results  Component Value Date   CHOLHDL 4 08/03/2014     Assessment & Plan  Memory loss Patient in today accompanied by her daughter. They are concerned that her short term memory is progressively worsening. Her daughter is more concerned than she is but she acknowledges she has been worsening and this has increased her anxiety levels as well. They agree to start titrating off meds that may be contributing and would like to undergo neurologic evaluation, is referred to neurology   Tachycardia RRR today, asyptomatic   AKI (acute kidney injury) Improved since hospitalization   Protein deficiency Encouraged increased po intake and especially protein sources. Consider a nutrition consult if unable to achieve will monitor   Hyperglycemia hgba1c acceptable, minimize simple carbs. Increase exercise as tolerated. Continue current meds   Dyslipidemia Tolerating statin, encouraged heart healthy diet, avoid trans fats, minimize simple carbs and saturated fats. Increase exercise as tolerated   UTI (urinary tract infection) Urine culture negative

## 2015-02-11 NOTE — Assessment & Plan Note (Signed)
Improved since hospitalization

## 2015-02-11 NOTE — Assessment & Plan Note (Signed)
Urine culture negative.

## 2015-02-13 ENCOUNTER — Other Ambulatory Visit: Payer: Self-pay | Admitting: Family Medicine

## 2015-02-13 DIAGNOSIS — M1711 Unilateral primary osteoarthritis, right knee: Secondary | ICD-10-CM | POA: Diagnosis not present

## 2015-02-15 ENCOUNTER — Ambulatory Visit
Admission: RE | Admit: 2015-02-15 | Discharge: 2015-02-15 | Disposition: A | Discharge status: Home / Self Care | Payer: Medicare Other | Source: Ambulatory Visit | Attending: Surgery | Admitting: Surgery

## 2015-02-15 ENCOUNTER — Ambulatory Visit (INDEPENDENT_AMBULATORY_CARE_PROVIDER_SITE_OTHER): Payer: Medicare Other | Admitting: *Deleted

## 2015-02-15 DIAGNOSIS — I4892 Unspecified atrial flutter: Secondary | ICD-10-CM | POA: Diagnosis not present

## 2015-02-15 DIAGNOSIS — E042 Nontoxic multinodular goiter: Secondary | ICD-10-CM | POA: Diagnosis not present

## 2015-02-15 LAB — POCT INR: INR: 2.2

## 2015-02-19 ENCOUNTER — Emergency Department (HOSPITAL_COMMUNITY): Payer: Medicare Other

## 2015-02-19 ENCOUNTER — Observation Stay (HOSPITAL_COMMUNITY)
Admission: EM | Admit: 2015-02-19 | Discharge: 2015-02-20 | Disposition: A | Discharge status: Home / Self Care | Payer: Medicare Other | Attending: Internal Medicine | Admitting: Internal Medicine

## 2015-02-19 DIAGNOSIS — R002 Palpitations: Secondary | ICD-10-CM | POA: Diagnosis not present

## 2015-02-19 DIAGNOSIS — F418 Other specified anxiety disorders: Secondary | ICD-10-CM | POA: Insufficient documentation

## 2015-02-19 DIAGNOSIS — Z8619 Personal history of other infectious and parasitic diseases: Secondary | ICD-10-CM | POA: Insufficient documentation

## 2015-02-19 DIAGNOSIS — Z8742 Personal history of other diseases of the female genital tract: Secondary | ICD-10-CM | POA: Diagnosis not present

## 2015-02-19 DIAGNOSIS — E039 Hypothyroidism, unspecified: Secondary | ICD-10-CM | POA: Diagnosis present

## 2015-02-19 DIAGNOSIS — G43909 Migraine, unspecified, not intractable, without status migrainosus: Secondary | ICD-10-CM | POA: Insufficient documentation

## 2015-02-19 DIAGNOSIS — R011 Cardiac murmur, unspecified: Secondary | ICD-10-CM | POA: Diagnosis not present

## 2015-02-19 DIAGNOSIS — I4891 Unspecified atrial fibrillation: Secondary | ICD-10-CM

## 2015-02-19 DIAGNOSIS — G8929 Other chronic pain: Secondary | ICD-10-CM | POA: Insufficient documentation

## 2015-02-19 DIAGNOSIS — Z79899 Other long term (current) drug therapy: Secondary | ICD-10-CM | POA: Diagnosis not present

## 2015-02-19 DIAGNOSIS — Z87891 Personal history of nicotine dependence: Secondary | ICD-10-CM | POA: Insufficient documentation

## 2015-02-19 DIAGNOSIS — M810 Age-related osteoporosis without current pathological fracture: Secondary | ICD-10-CM | POA: Insufficient documentation

## 2015-02-19 DIAGNOSIS — K219 Gastro-esophageal reflux disease without esophagitis: Secondary | ICD-10-CM | POA: Diagnosis present

## 2015-02-19 DIAGNOSIS — M21371 Foot drop, right foot: Secondary | ICD-10-CM | POA: Diagnosis not present

## 2015-02-19 DIAGNOSIS — I480 Paroxysmal atrial fibrillation: Principal | ICD-10-CM | POA: Insufficient documentation

## 2015-02-19 DIAGNOSIS — Z85038 Personal history of other malignant neoplasm of large intestine: Secondary | ICD-10-CM | POA: Diagnosis not present

## 2015-02-19 DIAGNOSIS — R296 Repeated falls: Secondary | ICD-10-CM | POA: Insufficient documentation

## 2015-02-19 DIAGNOSIS — Z86010 Personal history of colonic polyps: Secondary | ICD-10-CM | POA: Diagnosis not present

## 2015-02-19 DIAGNOSIS — E785 Hyperlipidemia, unspecified: Secondary | ICD-10-CM | POA: Insufficient documentation

## 2015-02-19 DIAGNOSIS — M1990 Unspecified osteoarthritis, unspecified site: Secondary | ICD-10-CM | POA: Insufficient documentation

## 2015-02-19 DIAGNOSIS — K589 Irritable bowel syndrome without diarrhea: Secondary | ICD-10-CM | POA: Insufficient documentation

## 2015-02-19 LAB — URINALYSIS, ROUTINE W REFLEX MICROSCOPIC
Bilirubin Urine: NEGATIVE
Glucose, UA: NEGATIVE mg/dL
Ketones, ur: NEGATIVE mg/dL
Leukocytes, UA: NEGATIVE
Nitrite: NEGATIVE
Protein, ur: NEGATIVE mg/dL
Specific Gravity, Urine: 1.007 (ref 1.005–1.030)
Urobilinogen, UA: 0.2 mg/dL (ref 0.0–1.0)
pH: 6.5 (ref 5.0–8.0)

## 2015-02-19 LAB — I-STAT TROPONIN, ED: Troponin i, poc: 0 ng/mL (ref 0.00–0.08)

## 2015-02-19 LAB — CBC
HCT: 40.4 % (ref 36.0–46.0)
Hemoglobin: 13.5 g/dL (ref 12.0–15.0)
MCH: 29.2 pg (ref 26.0–34.0)
MCHC: 33.4 g/dL (ref 30.0–36.0)
MCV: 87.4 fL (ref 78.0–100.0)
Platelets: 242 10*3/uL (ref 150–400)
RBC: 4.62 MIL/uL (ref 3.87–5.11)
RDW: 14.7 % (ref 11.5–15.5)
WBC: 7.5 10*3/uL (ref 4.0–10.5)

## 2015-02-19 LAB — BASIC METABOLIC PANEL
Anion gap: 8 (ref 5–15)
BUN: 14 mg/dL (ref 6–20)
CO2: 25 mmol/L (ref 22–32)
Calcium: 9.1 mg/dL (ref 8.9–10.3)
Chloride: 107 mmol/L (ref 101–111)
Creatinine, Ser: 0.85 mg/dL (ref 0.44–1.00)
GFR calc Af Amer: >60 mL/min (ref >60)
GFR calc non Af Amer: >60 mL/min (ref >60)
Glucose, Bld: 108 mg/dL — ABNORMAL HIGH (ref 65–99)
Potassium: 3.4 mmol/L — ABNORMAL LOW (ref 3.5–5.1)
Sodium: 140 mmol/L (ref 135–145)

## 2015-02-19 LAB — URINE MICROSCOPIC-ADD ON

## 2015-02-19 LAB — PROTIME-INR
INR: 2.21 — ABNORMAL HIGH (ref 0.00–1.49)
Prothrombin Time: 24.4 seconds — ABNORMAL HIGH (ref 11.6–15.2)

## 2015-02-19 LAB — MAGNESIUM: Magnesium: 1.8 mg/dL (ref 1.7–2.4)

## 2015-02-19 LAB — TSH
TSH: 5.261 u[IU]/mL — ABNORMAL HIGH (ref 0.350–4.500)
TSH: 5.792 u[IU]/mL — ABNORMAL HIGH (ref 0.350–4.500)

## 2015-02-19 LAB — TROPONIN I: Troponin I: <0.03 ng/mL (ref <0.031)

## 2015-02-19 MED ORDER — EZETIMIBE 10 MG PO TABS
10.0000 mg | ORAL_TABLET | Freq: Every day | ORAL | Status: DC
  Administered 2015-02-20: 10 mg via ORAL
  Filled 2015-02-19: qty 1

## 2015-02-19 MED ORDER — DILTIAZEM LOAD VIA INFUSION
15.0000 mg | Freq: Once | INTRAVENOUS | Status: AC
  Administered 2015-02-19: 15 mg via INTRAVENOUS
  Filled 2015-02-19: qty 15

## 2015-02-19 MED ORDER — GABAPENTIN 600 MG PO TABS
600.0000 mg | ORAL_TABLET | Freq: Three times a day (TID) | ORAL | Status: DC
  Administered 2015-02-20 (×2): 600 mg via ORAL
  Filled 2015-02-19 (×2): qty 1

## 2015-02-19 MED ORDER — WARFARIN SODIUM 5 MG PO TABS
5.0000 mg | ORAL_TABLET | Freq: Once | ORAL | Status: AC
Start: 2015-02-19 — End: 2015-02-19
  Administered 2015-02-19: 5 mg via ORAL
  Filled 2015-02-19: qty 1

## 2015-02-19 MED ORDER — SIMVASTATIN 40 MG PO TABS
40.0000 mg | ORAL_TABLET | Freq: Every evening | ORAL | Status: DC
  Administered 2015-02-20: 40 mg via ORAL
  Filled 2015-02-19: qty 1

## 2015-02-19 MED ORDER — NORTRIPTYLINE HCL 25 MG PO CAPS
50.0000 mg | ORAL_CAPSULE | Freq: Every day | ORAL | Status: DC
  Administered 2015-02-20: 50 mg via ORAL
  Filled 2015-02-19: qty 2

## 2015-02-19 MED ORDER — DILTIAZEM HCL 100 MG IV SOLR
10.0000 mg/h | INTRAVENOUS | Status: DC
  Administered 2015-02-19: 10 mg/h via INTRAVENOUS
  Administered 2015-02-19: 15 mg/h via INTRAVENOUS

## 2015-02-19 MED ORDER — HYDROXYZINE HCL 25 MG PO TABS: 25.0000 mg | ORAL_TABLET | Freq: Four times a day (QID) | ORAL | Status: DC | PRN

## 2015-02-19 MED ORDER — POTASSIUM CHLORIDE CRYS ER 20 MEQ PO TBCR
40.0000 meq | EXTENDED_RELEASE_TABLET | Freq: Once | ORAL | Status: AC
  Administered 2015-02-20: 40 meq via ORAL
  Filled 2015-02-19: qty 2

## 2015-02-19 MED ORDER — WARFARIN - PHARMACIST DOSING INPATIENT: Freq: Every day | Status: DC

## 2015-02-19 NOTE — ED Provider Notes (Signed)
CSN: [id number]     Arrival date & time 02/19/15  1702 History   First MD Initiated Contact with Patient 02/19/15 1714     Chief Complaint  Patient presents with  . Palpitations     (Consider location/radiation/quality/duration/timing/severity/associated sxs/prior Treatment) HPI Comments: Patient presents to the emergency department for evaluation of palpitations. Patient reports that she was working in her garden when symptoms began at 4:30 PM today. She had sudden onset of palpitations but denies any chest pain or shortness of breath. Patient reports a previous history of atrial fibrillation. She reports that she has been cardioverted 2 separate times, both times chemically.  Patient is a 81 y.o. female presenting with palpitations.  Palpitations   Past Medical History  Diagnosis Date  . Hyperlipidemia     takes Zetia and Simvastatin nightly  . Palpitation   . Carotid bruit     Right  . Edema   . Arthritis   . Allergy     portabella mushrooms/diarrhea  . Heart murmur   . Osteoporosis   . Fibromyalgia   . Thrush 03/23/2012  . Vaginitis 03/23/2012  . Sigmoid colon ulcer 03/23/2012  . Hyperglycemia 04/27/2012  . History of migraine     last about 8yrs ago  . Foot drop, right   . Joint pain   . Joint swelling   . Chronic back pain   . Skin problem in pregnancy     takes gabapentin 3 times a day and also has an ointment  . GERD (gastroesophageal reflux disease)     takes Zantac and Nexium daily  . Hemorrhoids   . Diverticulosis   . IBS (irritable bowel syndrome)     takes align daily  . Hypocalcemia 03/29/2013  . Depression with anxiety 03/29/2013    hx of  . Tachycardia 07/03/2013  . Onychomycosis 07/03/2013  . Adenomatous polyp of colon 1991, 2008  . Medicare annual wellness visit, subsequent 08/13/2014  . Dysrhythmia     paroxysmal A fib, irregular heartbeat;takes Bisoprolol nightly  . Migraine 04/27/2012    last 40 years ago  . Falls frequently   . Memory loss  02/11/2015   Past Surgical History  Procedure Laterality Date  . Abdominal hysterectomy  1981  . Lumbar laminectomy  1960    x 2  . Veins stripped  1970  . Total hip arthroplasty Left 2012  . Carpal tunnel release Left 2012    ulnar nerve release at elbow  . Cervical fusion  2003, 2005, 2013  . Tonsillectomy  1943  . Left elbow surgery  06/03/11    cyst removed  . Colonoscopy    . Posterior cervical fusion/foraminotomy  08/03/2012    Procedure: POSTERIOR CERVICAL FUSION/FORAMINOTOMY LEVEL 5;  Surgeon: [name], MD;  Location: [facility];  Service: Neurosurgery;  Laterality: N/A;  Cervical four to Thoracic two Posterior cervical decompression with Facet and Pedicle screw fixation  . Back surgery  1997    reconstructive lower back surgery  . Wrist surgery Right 1990's    cyst removed  . Eye surgery  2015    b/l cataracts removed  . Total knee arthroplasty Right 11/20/2014    Procedure: RIGHT TOTAL KNEE ARTHROPLASTY;  Surgeon: [name], MD;  Location: [facility];  Service: Orthopedics;  Laterality: Right;   Family History  Problem Relation Age of Onset  . Bipolar disorder Mother   . Stomach cancer Mother   . Mental illness Mother   . Stroke Father   .  Colon cancer Father   . Throat cancer Maternal Uncle     larynx  . HIV Son     Aids  . Anesthesia problems Neg Hx   . Stomach cancer Maternal Grandfather   . Stomach cancer Paternal Grandmother   . Anxiety disorder Mother   . Diabetes Neg Hx   . Heart disease Maternal Grandmother    History  Substance Use Topics  . Smoking status: Former Smoker -- 0.25 packs/day for 4 years    Types: Cigarettes  . Smokeless tobacco: Never Used     Comment: quit 20+yrs ago  . Alcohol Use: Yes     Comment: WINE WITH DINNER. 2 GLASSES RED WINE   OB History    No data available     Review of Systems  Cardiovascular: Positive for palpitations.  All other systems reviewed and are negative.     Allergies  Cefuroxime axetil;  Macrodantin; Alendronate sodium; Codeine; Demerol; Meperidine hcl; Other; and Sulfonamide derivatives  Home Medications   Prior to Admission medications   Medication Sig Start Date End Date Taking? Authorizing Provider  Biotin 5000 MCG TABS Take 5,000 mcg by mouth daily.   Yes Historical Provider, MD  bisoprolol (ZEBETA) 5 MG tablet Take 5 mg by mouth daily.   Yes Historical Provider, MD  Calcium Carb-Cholecalciferol 600-200 MG-UNIT TABS Take 1 tablet by mouth daily.   Yes Historical Provider, MD  CARTIA XT 120 MG 24 hr capsule Take 120 mg by mouth every morning.  05/30/14  Yes Historical Provider, MD  citalopram (CELEXA) 10 MG tablet Take 10 mg by mouth daily.   Yes Historical Provider, MD  esomeprazole (NEXIUM) 40 MG capsule Take 40 mg by mouth at bedtime.    Yes Historical Provider, MD  eszopiclone (LUNESTA) 2 MG TABS tablet Take 1 tablet (2 mg total) by mouth at bedtime as needed for sleep. Take immediately before bedtime Patient taking differently: Take 2 mg by mouth at bedtime. Take immediately before bedtime 01/29/15  Yes Mosie Lukes, MD  ezetimibe (ZETIA) 10 MG tablet Take 10 mg by mouth at bedtime.    Yes Historical Provider, MD  gabapentin (NEURONTIN) 600 MG tablet TAKE 1 TABLET BY MOUTH THREE TIMES DAILY 09/11/14  Yes Mosie Lukes, MD  hydrOXYzine (ATARAX/VISTARIL) 10 MG tablet Take 20-30 mg by mouth at bedtime. For itching or anxiety 01/31/15  Yes Historical Provider, MD  MEGARED OMEGA-3 KRILL OIL 500 MG CAPS Take 1 tablet by mouth daily.   Yes Historical Provider, MD  Multiple Vitamins-Minerals (CENTRUM SILVER ULTRA WOMENS) TABS Take 1 tablet by mouth daily.   Yes Historical Provider, MD  NEXIUM 40 MG capsule TAKE 1 CAPSULE BY MOUTH EVERY DAY AT 2PM Patient taking differently: TAKE 1 CAPSULE BY MOUTH EVERY night 02/13/15  Yes Mosie Lukes, MD  nortriptyline (PAMELOR) 25 MG capsule Take 2 capsules (50 mg total) by mouth at bedtime. 01/24/15  Yes Brunetta Jeans, PA-C  ranitidine  (ZANTAC) 300 MG tablet TAKE 1 TABLET BY MOUTH AT BEDTIME 10/23/14  Yes Mosie Lukes, MD  simvastatin (ZOCOR) 40 MG tablet TAKE 1 TABLET BY MOUTH EVERY EVENING 09/11/14  Yes Mosie Lukes, MD  clobetasol ointment (TEMOVATE) 2.68 % Apply 1 application topically 2 (two) times daily as needed (for itching).    Historical Provider, MD  Eszopiclone 3 MG TABS Take 3 mg by mouth at bedtime as needed. For sleep 01/24/15   Historical Provider, MD   BP 126/87 mmHg  Pulse  144  Temp(Src) 97.7 F (36.5 C) (Oral)  Resp 20  Ht 5\' 3"  (1.6 m)  Wt 320 lb 8.8 oz (145.4 kg)  BMI 56.80 kg/m2  SpO2 98% Physical Exam  Constitutional: She is oriented to person, place, and time. She appears well-developed and well-nourished. No distress.  HENT:  Head: Normocephalic and atraumatic.  Right Ear: Hearing normal.  Left Ear: Hearing normal.  Nose: Nose normal.  Mouth/Throat: Oropharynx is clear and moist and mucous membranes are normal.  Eyes: Conjunctivae and EOM are normal. Pupils are equal, round, and reactive to light.  Neck: Normal range of motion. Neck supple.  Cardiovascular: S1 normal and S2 normal.  An irregularly irregular rhythm present. Tachycardia present.  Exam reveals no gallop and no friction rub.   No murmur heard. Pulmonary/Chest: Effort normal and breath sounds normal. No respiratory distress. She exhibits no tenderness.  Abdominal: Soft. Normal appearance and bowel sounds are normal. There is no hepatosplenomegaly. There is no tenderness. There is no rebound, no guarding, no tenderness at McBurney's point and negative Murphy's sign. No hernia.  Musculoskeletal: Normal range of motion.  Neurological: She is alert and oriented to person, place, and time. She has normal strength. No cranial nerve deficit or sensory deficit. Coordination normal. GCS eye subscore is 4. GCS verbal subscore is 5. GCS motor subscore is 6.  Skin: Skin is warm, dry and intact. No rash noted. No cyanosis.  Psychiatric: She  has a normal mood and affect. Her speech is normal and behavior is normal. Thought content normal.  Nursing note and vitals reviewed.   ED Course  Procedures (including critical care time) Labs Review Labs Reviewed  BASIC METABOLIC PANEL - Abnormal; Notable for the following:    Potassium 3.4 (*)    Glucose, Bld 108 (*)    All other components within normal limits  PROTIME-INR - Abnormal; Notable for the following:    Prothrombin Time 24.4 (*)    INR 2.21 (*)    All other components within normal limits  TSH - Abnormal; Notable for the following:    TSH 5.261 (*)    All other components within normal limits  URINALYSIS, ROUTINE W REFLEX MICROSCOPIC (NOT AT [facility]) - Abnormal; Notable for the following:    Color, Urine STRAW (*)    APPearance HAZY (*)    Hgb urine dipstick TRACE (*)    All other components within normal limits  TSH - Abnormal; Notable for the following:    TSH 5.792 (*)    All other components within normal limits  URINE MICROSCOPIC-ADD ON - Abnormal; Notable for the following:    Squamous Epithelial / LPF FEW (*)    All other components within normal limits  CBC  MAGNESIUM  TROPONIN I  TROPONIN I  TROPONIN I  PROTIME-INR  BRAIN NATRIURETIC PEPTIDE  CBC WITH DIFFERENTIAL/PLATELET  BASIC METABOLIC PANEL  Randolm Idol, ED    Imaging Review Dg Chest Port 1 View  02/19/2015   CLINICAL DATA:  Heart palpitations 1 day.  EXAM: PORTABLE CHEST - 1 VIEW  COMPARISON:  01/21/2015  FINDINGS: Lungs are adequately inflated without consolidation or effusion. Cardiomediastinal silhouette is within normal. There are degenerative changes of the spine. Fusion hardware over the cervical thoracic junction unchanged.  IMPRESSION: No active disease.   Electronically Signed   By: Marin Olp M.D.   On: 02/19/2015 17:42     EKG Interpretation   Date/Time:  Monday Feb 19 2015 17:11:28 EDT Ventricular Rate:  158  PR Interval:    QRS Duration: 84 QT Interval:  300 QTC  Calculation: 486 R Axis:   -20 Text Interpretation:  Atrial fibrillation with rapid ventricular response  Marked ST abnormality, possible inferolateral subendocardial injury  Abnormal ECG Confirmed by [place] Kuang  MD, Albie Bazin ([id number]) on 02/19/2015  5:15:13 PM      MDM   Final diagnoses:  None   atrial fibrillation with rapid ventricular response  She presented to the ER for evaluation of palpitations. Patient has a history of paroxysmal atrial fibrillation. She was found to be in atrial fibrillation with rapid ventricular response. She did not have complete response to Cardizem. She was placed on a drip and it was titrated for rate. Patient is already anticoagulated. Cardiology was consulted and have admitted the patient for further management.    Orpah Greek, MD 02/20/15 [phone]

## 2015-02-19 NOTE — ED Notes (Signed)
Pt in c/o palpitations that started around 430pm, history of afib, in uncontrolled afib during triage

## 2015-02-19 NOTE — ED Notes (Signed)
Radiology tech at bedside with portable xray.

## 2015-02-19 NOTE — ED Notes (Signed)
Dr. Pollina at bedside   

## 2015-02-19 NOTE — ED Notes (Signed)
Pt states "I have a new symptom, my head feels like I am going very fast down an elevator" Pt denies pain or pressure in her chest. Dr. Betsey Holiday notified.

## 2015-02-19 NOTE — ED Notes (Signed)
Charge Nurse/Nurse informed appt time @2320 .

## 2015-02-19 NOTE — H&P (Addendum)
Chelsea Hancock is an 81 y.o. female.    Chief Complaint: palpitations Primary cardiologist: Dr. [name] HPI: Ms. Chelsea Hancock is a 81 yo woman with PMH of paroxysmal atrial flutter on warfarin, dyslipidemia, GERD, 2/16 TKA, hypothyroidism who was seen by Dr. [name] last 02/08/15 who presented to the ER today with palpitations. She tells me she's had palpitations 3x in the last year. She was hospitalized for hypotension/dehydration 5/1-01/23/15 this year and subsequently had bisoprolol decreased from twice daily to once daily. She also has had a UTI and brief period (5/4-5/12) with subtherapeutic INR (while on an antibiotic). In the ER she was found to be in atrial fibrillation with RVR. She is accompanied by a close friend and his son. She denies fevers/chills/recurrent UTI symptoms/nausea/vomiting/diarrhea. She's compliant with her medications. She actually was working in the yard at 4:30 pm when she had sudden onsent of palpitations. No lightheadedness or dizziness. In the ER, I spoke with her daughter on the phone as well about findings and plan.      Past Medical History  Diagnosis Date  . Hyperlipidemia     takes Zetia and Simvastatin nightly  . Palpitation   . Carotid bruit     Right  . Edema   . Arthritis   . Allergy     portabella mushrooms/diarrhea  . Heart murmur   . Osteoporosis   . Fibromyalgia   . Thrush 03/23/2012  . Vaginitis 03/23/2012  . Sigmoid colon ulcer 03/23/2012  . Hyperglycemia 04/27/2012  . History of migraine     last about 12yr ago  . Foot drop, right   . Joint pain   . Joint swelling   . Chronic back pain   . Skin problem in pregnancy     takes gabapentin 3 times a day and also has an ointment  . GERD (gastroesophageal reflux disease)     takes Zantac and Nexium daily  . Hemorrhoids   . Diverticulosis   . IBS (irritable bowel syndrome)     takes align daily  . Hypocalcemia 03/29/2013  . Depression with anxiety 03/29/2013    hx of  .  Tachycardia 07/03/2013  . Onychomycosis 07/03/2013  . Adenomatous polyp of colon 1991, 2008  . Medicare annual wellness visit, subsequent 08/13/2014  . Dysrhythmia     paroxysmal A fib, irregular heartbeat;takes Bisoprolol nightly  . Migraine 04/27/2012    last 40 years ago  . Falls frequently   . Memory loss 02/11/2015    Past Surgical History  Procedure Laterality Date  . Abdominal hysterectomy  1981  . Lumbar laminectomy  1960    x 2  . Veins stripped  1970  . Total hip arthroplasty Left 2012  . Carpal tunnel release Left 2012    ulnar nerve release at elbow  . Cervical fusion  2003, 2005, 2013  . Tonsillectomy  1943  . Left elbow surgery  06/03/11    cyst removed  . Colonoscopy    . Posterior cervical fusion/foraminotomy  08/03/2012    Procedure: POSTERIOR CERVICAL FUSION/FORAMINOTOMY LEVEL 5;  Surgeon: HKristeen Miss MD;  [facility];  Service: Neurosurgery;  Laterality: N/A;  Cervical four to Thoracic two Posterior cervical decompression with Facet and Pedicle screw fixation  . Back surgery  1997    reconstructive lower back surgery  . Wrist surgery Right 1990's    cyst removed  . Eye surgery  2015    b/l cataracts removed  . Total knee arthroplasty Right 11/20/2014  Procedure: RIGHT TOTAL KNEE ARTHROPLASTY;  Surgeon: [name], MD;  Location: [facility];  Service: Orthopedics;  Laterality: Right;    Family History  Problem Relation Age of Onset  . Bipolar disorder Mother   . Stomach cancer Mother   . Mental illness Mother   . Stroke Father   . Colon cancer Father   . Throat cancer Maternal Uncle     larynx  . HIV Son     Aids  . Anesthesia problems Neg Hx   . Stomach cancer Maternal Grandfather   . Stomach cancer Paternal Grandmother   . Anxiety disorder Mother   . Diabetes Neg Hx   . Heart disease Maternal Grandmother    Social History:  reports that she has quit smoking. Her smoking use included Cigarettes. She has a 1 pack-year smoking  history. She has never used smokeless tobacco. She reports that she drinks alcohol. She reports that she does not use illicit drugs.  Allergies:  Allergies  Allergen Reactions  . Cefuroxime Axetil Anaphylaxis  . Macrodantin Anaphylaxis  . Alendronate Sodium Other (See Comments)    Severe chest pain similar to heart attack   . Codeine Nausea And Vomiting  . Demerol [Meperidine] Other (See Comments)    hallucinations  . Meperidine Hcl Nausea And Vomiting  . Other Other (See Comments)    Hismanal and Maprobamate  portobello mushrooms - diarrhea  . Sulfonamide Derivatives Hives, Itching and Swelling     (Not in a hospital admission)  Results for orders placed or performed during the hospital encounter of 02/19/15 (from the past 48 hour(s))  CBC     Status: None   Collection Time: 02/19/15  5:30 PM  Result Value Ref Range   WBC 7.5 4.0 - 10.5 K/uL   RBC 4.62 3.87 - 5.11 MIL/uL   Hemoglobin 13.5 12.0 - 15.0 g/dL   HCT 40.4 36.0 - 46.0 %   MCV 87.4 78.0 - 100.0 fL   MCH 29.2 26.0 - 34.0 pg   MCHC 33.4 30.0 - 36.0 g/dL   RDW 14.7 11.5 - 15.5 %   Platelets 242 150 - 400 K/uL  Basic metabolic panel     Status: Abnormal   Collection Time: 02/19/15  5:30 PM  Result Value Ref Range   Sodium 140 135 - 145 mmol/L   Potassium 3.4 (L) 3.5 - 5.1 mmol/L   Chloride 107 101 - 111 mmol/L   CO2 25 22 - 32 mmol/L   Glucose, Bld 108 (H) 65 - 99 mg/dL   BUN 14 6 - 20 mg/dL   Creatinine, Ser 0.85 0.44 - 1.00 mg/dL   Calcium 9.1 8.9 - 10.3 mg/dL   GFR calc non Af Amer >60 >60 mL/min   GFR calc Af Amer >60 >60 mL/min    Comment: (NOTE) The eGFR has been calculated using the CKD EPI equation. This calculation has not been validated in all clinical situations. eGFR's persistently <60 mL/min signify possible Chronic Kidney Disease.    Anion gap 8 5 - 15  Protime-INR     Status: Abnormal   Collection Time: 02/19/15  5:30 PM  Result Value Ref Range   Prothrombin Time 24.4 (H) 11.6 - 15.2  seconds   INR 2.21 (H) 0.00 - 1.49  I-stat troponin, ED  (not at [facility], [facility])     Status: None   Collection Time: 02/19/15  5:40 PM  Result Value Ref Range   Troponin i, poc 0.00 0.00 - 0.08 ng/mL  Comment 3            Comment: Due to the release kinetics of cTnI, a negative result within the first hours of the onset of symptoms does not rule out myocardial infarction with certainty. If myocardial infarction is still suspected, repeat the test at appropriate intervals.    Dg Chest Port 1 View  02/19/2015   CLINICAL DATA:  Heart palpitations 1 day.  EXAM: PORTABLE CHEST - 1 VIEW  COMPARISON:  01/21/2015  FINDINGS: Lungs are adequately inflated without consolidation or effusion. Cardiomediastinal silhouette is within normal. There are degenerative changes of the spine. Fusion hardware over the cervical thoracic junction unchanged.  IMPRESSION: No active disease.   Electronically Signed   By: Marin Olp M.D.   On: 02/19/2015 17:42    Review of Systems  Constitutional: Negative for fever, chills and malaise/fatigue.  HENT: Negative for tinnitus.   Eyes: Negative for double vision and photophobia.  Respiratory: Negative for cough and shortness of breath.   Cardiovascular: Positive for palpitations. Negative for chest pain and orthopnea.  Gastrointestinal: Positive for heartburn. Negative for nausea, vomiting and abdominal pain.  Genitourinary: Negative for dysuria, frequency and hematuria.  Musculoskeletal: Negative for myalgias and back pain.  Skin: Negative for rash.  Neurological: Negative for dizziness, tingling, tremors, sensory change, focal weakness, loss of consciousness and headaches.  Endo/Heme/Allergies: Negative for polydipsia. Bruises/bleeds easily.  Psychiatric/Behavioral: Negative for depression, suicidal ideas and substance abuse.    Blood pressure 145/94, pulse 141, temperature 98.5 F (36.9 C), temperature source Oral, resp. rate 19, height 5' 3" (1.6 m), weight  66.225 kg (146 lb), SpO2 94 %. Physical Exam  Nursing note and vitals reviewed. Constitutional: She is oriented to person, place, and time. She appears well-developed and well-nourished. No distress.  HENT:  Head: Normocephalic and atraumatic.  Nose: Nose normal.  Mouth/Throat: Oropharynx is clear and moist. No oropharyngeal exudate.  Eyes: Conjunctivae and EOM are normal. Pupils are equal, round, and reactive to light. No scleral icterus.  Neck: Normal range of motion. Neck supple. No JVD present. No tracheal deviation present.  Cardiovascular: Normal heart sounds and intact distal pulses.  Exam reveals no gallop.   No murmur heard. Irregularly irregular, tachycardic  Respiratory: Effort normal and breath sounds normal. No respiratory distress. She has no wheezes. She has no rales.  GI: Soft. Bowel sounds are normal. She exhibits no distension. There is no tenderness. There is no rebound.  Musculoskeletal: Normal range of motion. She exhibits no edema.  Neurological: She is alert and oriented to person, place, and time. No cranial nerve deficit.  Skin: Skin is warm. No rash noted. She is not diaphoretic. No erythema. No pallor.  Psychiatric: She has a normal mood and affect. Her behavior is normal. Thought content normal.   labs reviewed; na 140, K 3.4, bun/cr 14/0.85, trop 0.00, INR 2.2 H/h 13.8/40.4, plt 242 8/15 echo EF 55-60%, hba1c 5.9 EKG: atrial fibrillation with RVR vs. Atypical Atrial flutter   Assessment/Plan Ms. Mauriello is a 81 yo woman with paroxysmal atrial flutter, GERD, hypothyroidism, recent UTI/dehydration who presents with palpitations and found to have atrial fibrillation with RVR.  Atrial fibrillation + RVR: differential is idiopathic, ischemia, heart failure, drugs, thyroid disease, infection among other etiologies. She has mild hypokalemia and minimal symptoms but fast rate. Will continue anticoagulation with warfarin, keep NPO after MN, update TSH, trend cardiac  biomarkers, check BNP, check urinalysis. Problem List 1. Atrial fibrillation with RVR 2. Anticoagulation with warfarin 3.  GERD 4. Hypothyroidism - previous 5. Dyslipidemia 6. Memory problems per daughter - dementia workup being initiated  - diltiazem gtt, observe on telemetry, continue warfarin - NPO after MN for potential DCCV. subtherapeutic 5/4, therapeutic INR 5/12 - 5/31 is likely 3 weeks but officially 19-20 days - tsh, BNP, urinalysis - discussed plan with Ms. Lodwick and her daughter - could consider EP consult as well as this could be atrial flutter driving a fibrillation/flutter that would improve/cease with flutter ablation  Kayloni Rocco 02/19/2015, 10:04 PM

## 2015-02-19 NOTE — ED Notes (Signed)
Hildred Alamin in ED Pharmacy contacted for stat Cardizem drip

## 2015-02-19 NOTE — Progress Notes (Signed)
ANTICOAGULATION CONSULT NOTE - Initial Consult  Pharmacy Consult for warfarin Indication: atrial fibrillation  Allergies  Allergen Reactions  . Cefuroxime Axetil Anaphylaxis  . Macrodantin Anaphylaxis  . Alendronate Sodium Other (See Comments)    Severe chest pain similar to heart attack   . Codeine Nausea And Vomiting  . Demerol [Meperidine] Other (See Comments)    hallucinations  . Meperidine Hcl Nausea And Vomiting  . Other Other (See Comments)    Hismanal and Maprobamate  portobello mushrooms - diarrhea  . Sulfonamide Derivatives Hives, Itching and Swelling    Patient Measurements: Height: 5\' 3"  (160 cm) Weight: 146 lb (66.225 kg) IBW/kg (Calculated) : 52.4 Heparin Dosing Weight:   Vital Signs: Temp: 98.5 F (36.9 C) (05/30 1712) Temp Source: Oral (05/30 1712) BP: 137/82 mmHg (05/30 2230) Pulse Rate: 142 (05/30 2230)  Labs:  Recent Labs  02/19/15 1730  HGB 13.5  HCT 40.4  PLT 242  LABPROT 24.4*  INR 2.21*  CREATININE 0.85    Estimated Creatinine Clearance: 49.9 mL/min (by C-G formula based on Cr of 0.85).   Medical History: Past Medical History  Diagnosis Date  . Hyperlipidemia     takes Zetia and Simvastatin nightly  . Palpitation   . Carotid bruit     Right  . Edema   . Arthritis   . Allergy     portabella mushrooms/diarrhea  . Heart murmur   . Osteoporosis   . Fibromyalgia   . Thrush 03/23/2012  . Vaginitis 03/23/2012  . Sigmoid colon ulcer 03/23/2012  . Hyperglycemia 04/27/2012  . History of migraine     last about 37yrs ago  . Foot drop, right   . Joint pain   . Joint swelling   . Chronic back pain   . Skin problem in pregnancy     takes gabapentin 3 times a day and also has an ointment  . GERD (gastroesophageal reflux disease)     takes Zantac and Nexium daily  . Hemorrhoids   . Diverticulosis   . IBS (irritable bowel syndrome)     takes align daily  . Hypocalcemia 03/29/2013  . Depression with anxiety 03/29/2013    hx of  .  Tachycardia 07/03/2013  . Onychomycosis 07/03/2013  . Adenomatous polyp of colon 1991, 2008  . Medicare annual wellness visit, subsequent 08/13/2014  . Dysrhythmia     paroxysmal A fib, irregular heartbeat;takes Bisoprolol nightly  . Migraine 04/27/2012    last 40 years ago  . Falls frequently   . Memory loss 02/11/2015    Medications:   (Not in a hospital admission)  Assessment: 81 yo female with pAFib on warfarin PTA.  She takes 7.5 mg on Sun/Thurs and 5 mg all other days. Current INR therapeutic at 2.2. May go for DCCV tomorrow.   Goal of Therapy:  INR 2-3 Monitor platelets by anticoagulation protocol: Yes   Plan:  -Warfarin 5 mg po x1 -Daily INR   Hughes Better, PharmD, BCPS Clinical Pharmacist Pager: [phone] 02/19/2015 10:35 PM

## 2015-02-20 ENCOUNTER — Encounter (HOSPITAL_COMMUNITY): Payer: Self-pay | Admitting: *Deleted

## 2015-02-20 ENCOUNTER — Other Ambulatory Visit: Payer: Self-pay | Admitting: Physician Assistant

## 2015-02-20 DIAGNOSIS — R011 Cardiac murmur, unspecified: Secondary | ICD-10-CM | POA: Diagnosis not present

## 2015-02-20 DIAGNOSIS — I4891 Unspecified atrial fibrillation: Secondary | ICD-10-CM | POA: Diagnosis not present

## 2015-02-20 DIAGNOSIS — I480 Paroxysmal atrial fibrillation: Secondary | ICD-10-CM | POA: Diagnosis not present

## 2015-02-20 DIAGNOSIS — M1990 Unspecified osteoarthritis, unspecified site: Secondary | ICD-10-CM | POA: Diagnosis not present

## 2015-02-20 DIAGNOSIS — E785 Hyperlipidemia, unspecified: Secondary | ICD-10-CM

## 2015-02-20 DIAGNOSIS — Z5181 Encounter for therapeutic drug level monitoring: Secondary | ICD-10-CM

## 2015-02-20 DIAGNOSIS — Z79899 Other long term (current) drug therapy: Secondary | ICD-10-CM

## 2015-02-20 LAB — PROTIME-INR
INR: 2.12 — ABNORMAL HIGH (ref 0.00–1.49)
Prothrombin Time: 23.6 seconds — ABNORMAL HIGH (ref 11.6–15.2)

## 2015-02-20 LAB — CBC WITH DIFFERENTIAL/PLATELET
Basophils Absolute: 0 10*3/uL (ref 0.0–0.1)
Basophils Relative: 0 % (ref 0–1)
Eosinophils Absolute: 0.1 10*3/uL (ref 0.0–0.7)
Eosinophils Relative: 1 % (ref 0–5)
HCT: 40.7 % (ref 36.0–46.0)
Hemoglobin: 13.6 g/dL (ref 12.0–15.0)
Lymphocytes Relative: 27 % (ref 12–46)
Lymphs Abs: 1.9 10*3/uL (ref 0.7–4.0)
MCH: 29.2 pg (ref 26.0–34.0)
MCHC: 33.4 g/dL (ref 30.0–36.0)
MCV: 87.5 fL (ref 78.0–100.0)
Monocytes Absolute: 0.7 10*3/uL (ref 0.1–1.0)
Monocytes Relative: 10 % (ref 3–12)
Neutro Abs: 4.4 10*3/uL (ref 1.7–7.7)
Neutrophils Relative %: 62 % (ref 43–77)
Platelets: 234 10*3/uL (ref 150–400)
RBC: 4.65 MIL/uL (ref 3.87–5.11)
RDW: 14.8 % (ref 11.5–15.5)
WBC: 7.1 10*3/uL (ref 4.0–10.5)

## 2015-02-20 LAB — BASIC METABOLIC PANEL
Anion gap: 8 (ref 5–15)
BUN: 10 mg/dL (ref 6–20)
CO2: 22 mmol/L (ref 22–32)
Calcium: 8.5 mg/dL — ABNORMAL LOW (ref 8.9–10.3)
Chloride: 106 mmol/L (ref 101–111)
Creatinine, Ser: 0.58 mg/dL (ref 0.44–1.00)
GFR calc Af Amer: >60 mL/min (ref >60)
GFR calc non Af Amer: >60 mL/min (ref >60)
Glucose, Bld: 108 mg/dL — ABNORMAL HIGH (ref 65–99)
Potassium: 3.4 mmol/L — ABNORMAL LOW (ref 3.5–5.1)
Sodium: 136 mmol/L (ref 135–145)

## 2015-02-20 LAB — TROPONIN I
Troponin I: <0.03 ng/mL (ref <0.031)
Troponin I: <0.03 ng/mL (ref <0.031)

## 2015-02-20 LAB — BRAIN NATRIURETIC PEPTIDE: B Natriuretic Peptide: 125.1 pg/mL — ABNORMAL HIGH (ref 0.0–100.0)

## 2015-02-20 MED ORDER — FLECAINIDE ACETATE 50 MG PO TABS
50.0000 mg | ORAL_TABLET | Freq: Two times a day (BID) | ORAL | Status: DC
  Administered 2015-02-20: 50 mg via ORAL
  Filled 2015-02-20 (×2): qty 1

## 2015-02-20 MED ORDER — ZOLPIDEM TARTRATE 5 MG PO TABS
5.0000 mg | ORAL_TABLET | Freq: Every evening | ORAL | Status: DC | PRN
Start: 2015-02-20 — End: 2015-02-20

## 2015-02-20 MED ORDER — OFF THE BEAT BOOK
Freq: Once | Status: AC
  Administered 2015-02-20: 03:00:00
  Filled 2015-02-20: qty 1

## 2015-02-20 MED ORDER — BISOPROLOL FUMARATE 5 MG PO TABS
5.0000 mg | ORAL_TABLET | Freq: Every day | ORAL | Status: DC
  Administered 2015-02-20: 5 mg via ORAL
  Filled 2015-02-20: qty 1

## 2015-02-20 MED ORDER — FLECAINIDE ACETATE 50 MG PO TABS: 50.0000 mg | ORAL_TABLET | Freq: Two times a day (BID) | ORAL | Status: DC

## 2015-02-20 MED ORDER — WARFARIN SODIUM 5 MG PO TABS: 5.0000 mg | ORAL_TABLET | Freq: Once | ORAL | Status: DC

## 2015-02-20 MED ORDER — WARFARIN SODIUM 5 MG PO TABS: 5.0000 mg | ORAL_TABLET | Freq: Every day | ORAL | Status: DC

## 2015-02-20 MED ORDER — CLOBETASOL PROPIONATE 0.05 % EX OINT: 1.0000 "application " | TOPICAL_OINTMENT | Freq: Two times a day (BID) | CUTANEOUS | Status: DC | PRN

## 2015-02-20 MED ORDER — CITALOPRAM HYDROBROMIDE 10 MG PO TABS
10.0000 mg | ORAL_TABLET | Freq: Every day | ORAL | Status: DC
  Administered 2015-02-20: 10 mg via ORAL
  Filled 2015-02-20: qty 1

## 2015-02-20 MED ORDER — PANTOPRAZOLE SODIUM 40 MG PO TBEC
40.0000 mg | DELAYED_RELEASE_TABLET | Freq: Every day | ORAL | Status: DC
Start: 2015-02-20 — End: 2015-02-20
  Administered 2015-02-20: 40 mg via ORAL
  Filled 2015-02-20: qty 1

## 2015-02-20 NOTE — Discharge Summary (Signed)
Discharge Summary   Patient ID: Chelsea Hancock,  MRN: [id number], DOB/AGE: [birth year] 81 y.o.  Admit date: 02/19/2015 Discharge date: 02/20/2015  Primary Care Provider: Penni Homans Primary Cardiologist: Dr. [name]  Discharge Diagnoses Principal Problem:   Atrial fibrillation with RVR Active Problems:   Hypothyroidism   GERD   Dyslipidemia   Allergies Allergies  Allergen Reactions  . Cefuroxime Axetil Anaphylaxis  . Macrodantin Anaphylaxis  . Alendronate Sodium Other (See Comments)    Severe chest pain similar to heart attack   . Codeine Nausea And Vomiting  . Demerol [Meperidine] Other (See Comments)    hallucinations  . Meperidine Hcl Nausea And Vomiting  . Other Other (See Comments)    Hismanal and Maprobamate  portobello mushrooms - diarrhea  . Sulfonamide Derivatives Hives, Itching and Swelling    Hospital Course  Mrs. Adcox is 81 yo female with PMH of HLD, GERD, hypothyroidism, and h/o proxysmal atrial flutter on coumadin. She has been complaint with her medication at home. He presented to [facility] on 02/19/2015 with sudden onset of palpitation. On arrival, she was in a-fib with RVR. She was placed on diltiazem gtt and spontaneously converted to NSR in the morning of 5/31 after a 5 secs prolonged pause. Post conversion, she had no recurrence of pauses or severe bradycardia.   She was seen in the morning of 02/20/2015 at which time she denies any CP or SOB. She did have some L arm pain, however EKG shows no sign of ischemia and it was quickly resolved. She is deemed stable for discharge from cardiac perspective. Given her recurrent symptom, I have discussed with Dr. Meda Coffee and start on fleicainide. She has no h/o CAD and her last echo in 04/2014 showed normal EF. We will arrange outpatient ETT within 5 days after starting fleicainide.    Discharge Vitals Blood pressure 113/58, pulse 71, temperature 98.2 F (36.8 C), temperature source Oral, resp. rate 17,  height 5\' 3"  (1.6 m), weight 320 lb 8.8 oz (145.4 kg), SpO2 100 %.  Filed Weights   02/19/15 1712 02/19/15 2334  Weight: 146 lb (66.225 kg) 320 lb 8.8 oz (145.4 kg)    Labs  CBC  Recent Labs  02/19/15 1730 02/20/15 0420  WBC 7.5 7.1  NEUTROABS  --  4.4  HGB 13.5 13.6  HCT 40.4 40.7  MCV 87.4 87.5  PLT 242 660   Basic Metabolic Panel  Recent Labs  02/19/15 1730 02/19/15 2224 02/20/15 0420  NA 140  --  136  K 3.4*  --  3.4*  CL 107  --  106  CO2 25  --  22  GLUCOSE 108*  --  108*  BUN 14  --  10  CREATININE 0.85  --  0.58  CALCIUM 9.1  --  8.5*  MG  --  1.8  --    Cardiac Enzymes  Recent Labs  02/19/15 2224 02/20/15 0420  TROPONINI <0.03 <0.03   Thyroid Function Tests  Recent Labs  02/19/15 2234  TSH 5.792*    Disposition  Pt is being discharged home today in good condition.  Follow-up Plans & Appointments      Follow-up Information    Follow up with Warren Danes, MD On 03/01/2015.   Specialty:  Cardiology   Why:  3:15pm   Contact information:   [address] [place] [address] [phone]       Follow up with [facility] EKG On 02/26/2015.   Specialty:  Cardiology   Why:  10:00am. [facility] EKG department in Echo lab on 1st floor, outpatient plain old treadmill test after starting flecainide. Sips of water ok, keep stomach as empty as possible   Contact information:   [address] [id number] [place] [phone]      Discharge Medications    Medication List    TAKE these medications        Biotin 5000 MCG Tabs  Take 5,000 mcg by mouth daily.     bisoprolol 5 MG tablet  Commonly known as:  ZEBETA  Take 5 mg by mouth daily.     Calcium Carb-Cholecalciferol 600-200 MG-UNIT Tabs  Take 1 tablet by mouth daily.     CARTIA XT 120 MG 24 hr capsule  Generic drug:  diltiazem  Take 120 mg by mouth every morning.     CENTRUM SILVER ULTRA WOMENS Tabs   Take 1 tablet by mouth daily.     citalopram 10 MG tablet  Commonly known as:  CELEXA  Take 10 mg by mouth daily.     clobetasol ointment 0.05 %  Commonly known as:  TEMOVATE  Apply 1 application topically 2 (two) times daily as needed (for itching).     esomeprazole 40 MG capsule  Commonly known as:  NEXIUM  Take 40 mg by mouth at bedtime.     NEXIUM 40 MG capsule  Generic drug:  esomeprazole  TAKE 1 CAPSULE BY MOUTH EVERY DAY AT 2PM     Eszopiclone 3 MG Tabs  Take 3 mg by mouth at bedtime as needed. For sleep     eszopiclone 2 MG Tabs tablet  Commonly known as:  LUNESTA  Take 1 tablet (2 mg total) by mouth at bedtime as needed for sleep. Take immediately before bedtime     ezetimibe 10 MG tablet  Commonly known as:  ZETIA  Take 10 mg by mouth at bedtime.     flecainide 50 MG tablet  Commonly known as:  TAMBOCOR  Take 1 tablet (50 mg total) by mouth every 12 (twelve) hours.     gabapentin 600 MG tablet  Commonly known as:  NEURONTIN  TAKE 1 TABLET BY MOUTH THREE TIMES DAILY     hydrOXYzine 10 MG tablet  Commonly known as:  ATARAX/VISTARIL  Take 20-30 mg by mouth at bedtime. For itching or anxiety     MEGARED OMEGA-3 KRILL OIL 500 MG Caps  Take 1 tablet by mouth daily.     nortriptyline 25 MG capsule  Commonly known as:  PAMELOR  Take 2 capsules (50 mg total) by mouth at bedtime.     ranitidine 300 MG tablet  Commonly known as:  ZANTAC  TAKE 1 TABLET BY MOUTH AT BEDTIME     simvastatin 40 MG tablet  Commonly known as:  ZOCOR  TAKE 1 TABLET BY MOUTH EVERY EVENING     warfarin 5 MG tablet  Commonly known as:  COUMADIN  Take 1 tablet (5 mg total) by mouth daily.        Outstanding Labs/Studies  Exercise tolerance test this Friday on 6/3 after starting flecainide therapy  Duration of Discharge Encounter   Greater than 30 minutes including physician time.  Hilbert Corrigan PA-C Pager: [phone] 02/20/2015, 12:35 PM

## 2015-02-20 NOTE — Progress Notes (Signed)
Pt c/o left shoulder pain 5/10 that is intermittent.  No worse with movement and resolves without intervention.  BP 124/70, hr 126.  EKG obtained and Dr. Claiborne Billings notified.  Will continue to monitor.

## 2015-02-20 NOTE — Progress Notes (Signed)
ANTICOAGULATION CONSULT NOTE - Follow up  Pharmacy Consult for warfarin Indication: atrial fibrillation  Allergies  Allergen Reactions  . Cefuroxime Axetil Anaphylaxis  . Macrodantin Anaphylaxis  . Alendronate Sodium Other (See Comments)    Severe chest pain similar to heart attack   . Codeine Nausea And Vomiting  . Demerol [Meperidine] Other (See Comments)    hallucinations  . Meperidine Hcl Nausea And Vomiting  . Other Other (See Comments)    Hismanal and Maprobamate  portobello mushrooms - diarrhea  . Sulfonamide Derivatives Hives, Itching and Swelling    Patient Measurements: Height: 5\' 3"  (160 cm) Weight: (!) 320 lb 8.8 oz (145.4 kg) IBW/kg (Calculated) : 52.4 Heparin Dosing Weight:   Vital Signs: Temp: 98.2 F (36.8 C) (05/31 0722) Temp Source: Oral (05/31 0722) BP: 113/58 mmHg (05/31 0722) Pulse Rate: 71 (05/31 0722)  Labs:  Recent Labs  02/19/15 1730 02/19/15 2224 02/20/15 0420 02/20/15 1216  HGB 13.5  --  13.6  --   HCT 40.4  --  40.7  --   PLT 242  --  234  --   LABPROT 24.4*  --  23.6*  --   INR 2.21*  --  2.12*  --   CREATININE 0.85  --  0.58  --   TROPONINI  --  <0.03 <0.03 <0.03    Estimated Creatinine Clearance: 82 mL/min (by C-G formula based on Cr of 0.58).   Medical History: Past Medical History  Diagnosis Date  . Hyperlipidemia     takes Zetia and Simvastatin nightly  . Palpitation   . Carotid bruit     Right  . Edema   . Arthritis   . Allergy     portabella mushrooms/diarrhea  . Heart murmur   . Osteoporosis   . Fibromyalgia   . Thrush 03/23/2012  . Vaginitis 03/23/2012  . Sigmoid colon ulcer 03/23/2012  . Hyperglycemia 04/27/2012  . History of migraine     last about 26yrs ago  . Foot drop, right   . Joint pain   . Joint swelling   . Chronic back pain   . Skin problem in pregnancy     takes gabapentin 3 times a day and also has an ointment  . GERD (gastroesophageal reflux disease)     takes Zantac and Nexium daily  .  Hemorrhoids   . Diverticulosis   . IBS (irritable bowel syndrome)     takes align daily  . Hypocalcemia 03/29/2013  . Depression with anxiety 03/29/2013    hx of  . Tachycardia 07/03/2013  . Onychomycosis 07/03/2013  . Adenomatous polyp of colon 1991, 2008  . Medicare annual wellness visit, subsequent 08/13/2014  . Dysrhythmia     paroxysmal A fib, irregular heartbeat;takes Bisoprolol nightly  . Migraine 04/27/2012    last 40 years ago  . Falls frequently   . Memory loss 02/11/2015    Medications:  Prescriptions prior to admission  Medication Sig Dispense Refill Last Dose  . Biotin 5000 MCG TABS Take 5,000 mcg by mouth daily.   02/19/2015 at Unknown time  . bisoprolol (ZEBETA) 5 MG tablet Take 5 mg by mouth daily.   02/19/2015 at 0830  . Calcium Carb-Cholecalciferol 600-200 MG-UNIT TABS Take 1 tablet by mouth daily.   02/19/2015 at Unknown time  . CARTIA XT 120 MG 24 hr capsule Take 120 mg by mouth every morning.    02/19/2015 at Unknown time  . citalopram (CELEXA) 10 MG tablet Take 10 mg by mouth  daily.   02/19/2015 at Unknown time  . esomeprazole (NEXIUM) 40 MG capsule Take 40 mg by mouth at bedtime.    02/18/2015 at Unknown time  . eszopiclone (LUNESTA) 2 MG TABS tablet Take 1 tablet (2 mg total) by mouth at bedtime as needed for sleep. Take immediately before bedtime (Patient taking differently: Take 2 mg by mouth at bedtime. Take immediately before bedtime) 30 tablet 0 02/18/2015 at Unknown time  . ezetimibe (ZETIA) 10 MG tablet Take 10 mg by mouth at bedtime.    02/18/2015 at Unknown time  . gabapentin (NEURONTIN) 600 MG tablet TAKE 1 TABLET BY MOUTH THREE TIMES DAILY 90 tablet 4 02/19/2015 at Unknown time  . hydrOXYzine (ATARAX/VISTARIL) 10 MG tablet Take 20-30 mg by mouth at bedtime. For itching or anxiety  1 02/18/2015 at Unknown time  . MEGARED OMEGA-3 KRILL OIL 500 MG CAPS Take 1 tablet by mouth daily.   02/19/2015 at Unknown time  . Multiple Vitamins-Minerals (CENTRUM SILVER ULTRA WOMENS)  TABS Take 1 tablet by mouth daily.   02/19/2015 at Unknown time  . NEXIUM 40 MG capsule TAKE 1 CAPSULE BY MOUTH EVERY DAY AT 2PM (Patient taking differently: TAKE 1 CAPSULE BY MOUTH EVERY night) 30 capsule 11 02/18/2015 at Unknown time  . nortriptyline (PAMELOR) 25 MG capsule Take 2 capsules (50 mg total) by mouth at bedtime. 60 capsule 1 02/18/2015 at Unknown time  . ranitidine (ZANTAC) 300 MG tablet TAKE 1 TABLET BY MOUTH AT BEDTIME 30 tablet 5 02/18/2015 at Unknown time  . simvastatin (ZOCOR) 40 MG tablet TAKE 1 TABLET BY MOUTH EVERY EVENING 30 tablet 4 02/18/2015 at Unknown time  . clobetasol ointment (TEMOVATE) 8.93 % Apply 1 application topically 2 (two) times daily as needed (for itching).   1 year  . Eszopiclone 3 MG TABS Take 3 mg by mouth at bedtime as needed. For sleep   Not Taking at Unknown time    Assessment: 81 yo female with pAFib on warfarin PTA.  Admitted 02/19/15 with Atrial fibrillation with RVR. She takes 7.5 mg on Sun/Thurs and 5 mg all other days. Gave coumadin 5 mg yesterday.  Today the INR = 2.12, remains therapeutic. The patient cardioverted overnight. Cardiologist today notes: will plan on starting Flecainide 50 mg po BID and schedule an outpatient exercise treadmill stress test for this Friday 6/3 on Monday 6/6  Goal of Therapy:  INR 2-3 Monitor platelets by anticoagulation protocol: Yes   Plan:  Cardiologist has discharged orders for today, to dc home on coumadin 5mg  daily.    Nicole Cella, RPh Clinical Pharmacist Pager: [phone] 02/20/2015 1:58 PM

## 2015-02-20 NOTE — Progress Notes (Addendum)
Patient Name: Chelsea Hancock Date of Encounter: 02/20/2015  Primary Cardiologist: Dr. [name]   Principal Problem:   Atrial fibrillation with RVR Active Problems:   Hypothyroidism   GERD   Dyslipidemia    SUBJECTIVE  Denies any CP or SOB. Had some L arm discomfort last night, improved.   CURRENT MEDS . bisoprolol  5 mg Oral Daily  . citalopram  10 mg Oral Daily  . ezetimibe  10 mg Oral QHS  . gabapentin  600 mg Oral TID  . nortriptyline  50 mg Oral QHS  . pantoprazole  40 mg Oral Daily  . simvastatin  40 mg Oral QPM  . Warfarin - Pharmacist Dosing Inpatient   Does not apply q1800    OBJECTIVE  Filed Vitals:   02/19/15 2334 02/20/15 0234 02/20/15 0512 02/20/15 0722  BP: 126/87 124/70 92/57 113/58  Pulse: 144  123 71  Temp: 97.7 F (36.5 C)  97.5 F (36.4 C) 98.2 F (36.8 C)  TempSrc: Oral  Oral Oral  Resp: 20 18 18 17   Height: 5\' 3"  (1.6 m)     Weight: 320 lb 8.8 oz (145.4 kg)     SpO2: 98% 96% 95% 100%    Intake/Output Summary (Last 24 hours) at 02/20/15 0823 Last data filed at 02/20/15 0600  Gross per 24 hour  Intake   3835 ml  Output   2150 ml  Net   1685 ml   Filed Weights   02/19/15 1712 02/19/15 2334  Weight: 146 lb (66.225 kg) 320 lb 8.8 oz (145.4 kg)    PHYSICAL EXAM  General: Pleasant, NAD. Neuro: Alert and oriented X 3. Moves all extremities spontaneously. Psych: Normal affect. HEENT:  Normal  Neck: Supple without bruits or JVD. Lungs:  Resp regular and unlabored, CTA. Heart: RRR no s3, s4, or murmurs. Abdomen: Soft, non-tender, non-distended, BS + x 4.  Extremities: No clubbing, cyanosis or edema. DP/PT/Radials 2+ and equal bilaterally.  Accessory Clinical Findings  CBC  Recent Labs  02/19/15 1730 02/20/15 0420  WBC 7.5 7.1  NEUTROABS  --  4.4  HGB 13.5 13.6  HCT 40.4 40.7  MCV 87.4 87.5  PLT 242 161   Basic Metabolic Panel  Recent Labs  02/19/15 1730 02/19/15 2224 02/20/15 0420  NA 140  --  136  K 3.4*   --  3.4*  CL 107  --  106  CO2 25  --  22  GLUCOSE 108*  --  108*  BUN 14  --  10  CREATININE 0.85  --  0.58  CALCIUM 9.1  --  8.5*  MG  --  1.8  --    Cardiac Enzymes  Recent Labs  02/19/15 2224 02/20/15 0420  TROPONINI <0.03 <0.03   Thyroid Function Tests  Recent Labs  02/19/15 2234  TSH 5.792*    TELE A-fib s/p 5 sec post termination pause before transitioning for NSR around 5:30am and HR 50-60s since    ECG  NSR with 1st degree AV block  Echocardiogram 05/06/2015  LV EF: 55% -  60%  ------------------------------------------------------------------- Indications:   Atrial flutter 427.32.  ------------------------------------------------------------------- History:  Risk factors: Former tobacco use. Dyslipidemia.  ------------------------------------------------------------------- Study Conclusions  - Left ventricle: The cavity size was normal. There was mild concentric hypertrophy. Systolic function was normal. The estimated ejection fraction was in the range of 55% to 60%. Wall motion was normal; there were no regional wall motion abnormalities. The study is not technically sufficient to allow  evaluation of LV diastolic function. - Atrial septum: No defect or patent foramen ovale was identified.    Radiology/Studies  US Soft Tissue Head/neck  02/15/2015   CLINICAL DATA:  Evaluate thyroid nodules.  EXAM: THYROID ULTRASOUND  TECHNIQUE: Ultrasound examination of the thyroid gland and adjacent soft tissues was performed.  COMPARISON:  10/27/2012  FINDINGS: Right thyroid lobe  Measurements: 4.5 x 1.6 x 2.1 cm. Numerous nodular structures in the right thyroid lobe, most of these structures are cystic. Dominant cyst in the inferior right thyroid lobe measures 1.6 x 1.1 x 1.2 cm and previously measured 1.3 x 0.8 x 1.1 cm. Mildly complex nodule along the medial right thyroid lobe measures 1.4 x 0.6 x 1.2 cm and previously measured 1.0 x 0.7 x 1.0  cm.  Left thyroid lobe  Measurements: 3.6 x 1.4 x 1.4 cm. Complex nodule in the superior left thyroid lobe measures 1.2 x 0.6 x 0.8 cm and previously measured 1.1 x 0.7 x 0.8 cm. Additional scattered nodules and cysts throughout the left thyroid lobe. A solid nodule in the inferior left thyroid lobe measures 0.7 x 0.4 x 0.6 cm. This inferior nodule was not clearly present on the previous examination.  Isthmus  Thickness: 0.2 cm.  No nodules visualized.  Lymphadenopathy  None visualized.  IMPRESSION: Bilateral thyroid nodules and cysts. The dominant lesions have minimally changed. New or enlarged heterogeneous nodule along the inferior left thyroid lobe measuring up to 0.7 cm. Recommend attention to this area on follow up imaging.   Electronically Signed   By: Markus Daft M.D.   On: 02/15/2015 14:30   Dg Chest Port 1 View  02/19/2015   CLINICAL DATA:  Heart palpitations 1 day.  EXAM: PORTABLE CHEST - 1 VIEW  COMPARISON:  01/21/2015  FINDINGS: Lungs are adequately inflated without consolidation or effusion. Cardiomediastinal silhouette is within normal. There are degenerative changes of the spine. Fusion hardware over the cervical thoracic junction unchanged.  IMPRESSION: No active disease.   Electronically Signed   By: Marin Olp M.D.   On: 02/19/2015 17:42   Dg Chest Port 1 View  01/21/2015   CLINICAL DATA:  Tachycardia today  EXAM: PORTABLE CHEST - 1 VIEW  COMPARISON:  11/22/2014  FINDINGS: Heart size and vascular pattern are normal. Lungs are clear. No effusions.  IMPRESSION: No active disease.   Electronically Signed   By: Skipper Cliche M.D.   On: 01/21/2015 17:04    ASSESSMENT AND PLAN  81 yo female with PMH of aflutter on coumadin, HLD, GERD, 2/16 TKA, hypothyroidism present with palpitation. Her bisoprolol decreased from BID to daily on 5/3 due to hypotension/dehydration.    1. Atrial fibrillation with RVR - converted to NSR around 5:30AM after a 5 sec post termination pause  - CHA2DS2-Vasc  score 3 (age, female), first diagnosed 12/2013  - INR therapeutic on coumadin.   - Given the long post termination pause and her recent admission for hypotension, would not increase bisoprolol. However she had more frequent episode, would consider addition of fleicainide (as she has no prior h/o MI and echo in 04/2014 is reassuring, although she will need outpatient ETT vs myoview)  - if symptom recur despite antiarrhythmic therapy, will need EP eval for possible ablation.  - Likely stable for discharge today.  2. HLD 3. Hypothyroidism 4. Memory problem undergoing workup for dementia   Signed, Woodward Ku Pager: [phone]   The patient was seen, examined and discussed with Almyra Deforest, PA-C and I  agree with the above.   The patient cardioverted overnight, she has a 5 second pause following the cardioversion that was asymptomatic. We will plan on starting Flecainide 50 mg po BID and schedule an outpatient exercise treadmill stress test for this Friday 6/3 on Monday 6/6. Baseline QRS, QT intervals are normal. Continue current dose of bisoprolol and chronic anticoagulation with warfarin.  Follow up with Dr Lovena Le.  Dorothy Spark, MD 02/20/2015

## 2015-02-20 NOTE — Progress Notes (Signed)
Pt had 5.05 second pause on telemetry followed by sinus in the 60s.  Pt without complaints. IV dilt dc'd per order parameters.  Dr. Claiborne Billings notified via [place].  Will continue to monitor.

## 2015-02-23 DIAGNOSIS — E042 Nontoxic multinodular goiter: Secondary | ICD-10-CM | POA: Diagnosis not present

## 2015-02-26 ENCOUNTER — Ambulatory Visit (HOSPITAL_COMMUNITY)
Admit: 2015-02-26 | Discharge: 2015-02-26 | Disposition: A | Discharge status: Home / Self Care | Payer: Medicare Other | Source: Ambulatory Visit | Attending: Physician Assistant | Admitting: Physician Assistant

## 2015-02-26 ENCOUNTER — Other Ambulatory Visit: Payer: Self-pay | Admitting: Family Medicine

## 2015-02-26 ENCOUNTER — Other Ambulatory Visit: Payer: Self-pay | Admitting: Physician Assistant

## 2015-02-26 DIAGNOSIS — I482 Chronic atrial fibrillation, unspecified: Secondary | ICD-10-CM

## 2015-02-26 DIAGNOSIS — R9431 Abnormal electrocardiogram [ECG] [EKG]: Secondary | ICD-10-CM | POA: Diagnosis not present

## 2015-02-26 DIAGNOSIS — Z79899 Other long term (current) drug therapy: Secondary | ICD-10-CM

## 2015-02-26 DIAGNOSIS — G4700 Insomnia, unspecified: Secondary | ICD-10-CM

## 2015-02-26 DIAGNOSIS — Z5181 Encounter for therapeutic drug level monitoring: Secondary | ICD-10-CM

## 2015-02-26 MED ORDER — SIMVASTATIN 40 MG PO TABS: 40.0000 mg | ORAL_TABLET | Freq: Every evening | ORAL | Status: DC

## 2015-02-26 MED ORDER — ESZOPICLONE 2 MG PO TABS: 2.0000 mg | ORAL_TABLET | Freq: Every evening | ORAL | Status: DC | PRN

## 2015-02-27 DIAGNOSIS — R413 Other amnesia: Secondary | ICD-10-CM | POA: Diagnosis not present

## 2015-02-27 DIAGNOSIS — F419 Anxiety disorder, unspecified: Secondary | ICD-10-CM | POA: Diagnosis not present

## 2015-02-27 DIAGNOSIS — I480 Paroxysmal atrial fibrillation: Secondary | ICD-10-CM | POA: Diagnosis not present

## 2015-02-27 DIAGNOSIS — K219 Gastro-esophageal reflux disease without esophagitis: Secondary | ICD-10-CM | POA: Diagnosis not present

## 2015-02-27 DIAGNOSIS — Z23 Encounter for immunization: Secondary | ICD-10-CM | POA: Diagnosis not present

## 2015-02-27 LAB — EXERCISE TOLERANCE TEST
Estimated workload: 4 METS
Exercise duration (min): 2 min
Exercise duration (sec): 35 s
MPHR: 142 {beats}/min
Peak HR: 74 {beats}/min
Percent HR: 52 %
Percent of predicted max HR: 52 %
RPE: 7
Rest HR: 60 {beats}/min
Stage 1 Grade: 0 %
Stage 1 HR: 61 {beats}/min
Stage 1 Speed: 0 mph
Stage 2 Grade: 0 %
Stage 2 HR: 61 {beats}/min
Stage 2 Speed: 0 mph
Stage 3 Grade: 8.9 %
Stage 3 HR: 62 {beats}/min
Stage 3 Speed: 1.7 mph
Stage 4 Grade: 10 %
Stage 4 HR: 74 {beats}/min
Stage 4 Speed: 1.4 mph
Stage 5 DBP: 53 mmHg
Stage 5 Grade: 0 %
Stage 5 HR: 69 {beats}/min
Stage 5 SBP: 132 mmHg
Stage 5 Speed: 0 mph
Stage 6 DBP: 68 mmHg
Stage 6 Grade: 0 %
Stage 6 HR: 56 {beats}/min
Stage 6 SBP: 139 mmHg
Stage 6 Speed: 0 mph

## 2015-02-27 NOTE — Telephone Encounter (Signed)
Faxed hardcopy for Lunesta to [facility]

## 2015-03-01 ENCOUNTER — Ambulatory Visit (INDEPENDENT_AMBULATORY_CARE_PROVIDER_SITE_OTHER): Payer: Medicare Other | Admitting: Cardiology

## 2015-03-01 ENCOUNTER — Encounter: Payer: Self-pay | Admitting: Cardiology

## 2015-03-01 VITALS — BP 126/70 | HR 53 | Ht 61.0 in | Wt 144.0 lb

## 2015-03-01 DIAGNOSIS — I4891 Unspecified atrial fibrillation: Secondary | ICD-10-CM

## 2015-03-01 DIAGNOSIS — I480 Paroxysmal atrial fibrillation: Secondary | ICD-10-CM

## 2015-03-01 DIAGNOSIS — I10 Essential (primary) hypertension: Secondary | ICD-10-CM

## 2015-03-01 MED ORDER — APIXABAN 5 MG PO TABS: 5.0000 mg | ORAL_TABLET | Freq: Two times a day (BID) | ORAL | Status: DC

## 2015-03-01 NOTE — Progress Notes (Signed)
Cardiology Office Note   Date:  03/01/2015   ID:  Chelsea Hancock, DOB 03/26/1934, MRN [id number]  PCP:  [name], MD  Cardiologist: Darlin Coco MD  No chief complaint on file.     History of Present Illness: Chelsea Hancock is a 81 y.o. female who presents for follow-up office visit.  She has a history of paroxysmal atrial flutter fibrillation.  She was recently readmitted to [facility] on 02/19/15 for recurrent atrial fibrillation with rapid ventricular response.  She was placed on IV diltiazem and converted overnight.  She had a 5 second pause at the time of conversion.  She was discharged on 02/20/15 on flecainide 50 mg twice a day in addition to bisoprolol and diltiazem.  He has had no further episodes of atrial flutter fibrillation.  She had a treadmill stress test on 02/26/15 which showed no ischemia or arrhythmias  the patient has normal left ventricular ejection fraction by echocardiogram. She is not physically active. In February 2016 the patient underwent a right total knee replacement by Dr.Aluisio. She did not have any arrhythmia problems during surgery but did have problems with low blood pressure. The patient was admitted to the hospital May 1 and discharge 01/23/2015 for weakness dehydration and low blood pressure.  Past Medical History  Diagnosis Date  . Hyperlipidemia     takes Zetia and Simvastatin nightly  . Palpitation   . Carotid bruit     Right  . Edema   . Arthritis   . Allergy     portabella mushrooms/diarrhea  . Heart murmur   . Osteoporosis   . Fibromyalgia   . Thrush 03/23/2012  . Vaginitis 03/23/2012  . Sigmoid colon ulcer 03/23/2012  . Hyperglycemia 04/27/2012  . History of migraine     last about 54yrs ago  . Foot drop, right   . Joint pain   . Joint swelling   . Chronic back pain   . Skin problem in pregnancy     takes gabapentin 3 times a day and also has an ointment  . GERD (gastroesophageal reflux  disease)     takes Zantac and Nexium daily  . Hemorrhoids   . Diverticulosis   . IBS (irritable bowel syndrome)     takes align daily  . Hypocalcemia 03/29/2013  . Depression with anxiety 03/29/2013    hx of  . Tachycardia 07/03/2013  . Onychomycosis 07/03/2013  . Adenomatous polyp of colon 1991, 2008  . Medicare annual wellness visit, subsequent 08/13/2014  . Dysrhythmia     paroxysmal A fib, irregular heartbeat;takes Bisoprolol nightly  . Migraine 04/27/2012    last 40 years ago  . Falls frequently   . Memory loss 02/11/2015    Past Surgical History  Procedure Laterality Date  . Abdominal hysterectomy  1981  . Lumbar laminectomy  1960    x 2  . Veins stripped  1970  . Total hip arthroplasty Left 2012  . Carpal tunnel release Left 2012    ulnar nerve release at elbow  . Cervical fusion  2003, 2005, 2013  . Tonsillectomy  1943  . Left elbow surgery  06/03/11    cyst removed  . Colonoscopy    . Posterior cervical fusion/foraminotomy  08/03/2012    Procedure: POSTERIOR CERVICAL FUSION/FORAMINOTOMY LEVEL 5;  Surgeon: [name], MD;  Location: [facility];  Service: Neurosurgery;  Laterality: N/A;  Cervical four to Thoracic two Posterior cervical decompression with Facet and Pedicle screw fixation  .  Back surgery  1997    reconstructive lower back surgery  . Wrist surgery Right 1990's    cyst removed  . Eye surgery  2015    b/l cataracts removed  . Total knee arthroplasty Right 11/20/2014    Procedure: RIGHT TOTAL KNEE ARTHROPLASTY;  Surgeon: [name], MD;  Location: [facility];  Service: Orthopedics;  Laterality: Right;     Current Outpatient Prescriptions  Medication Sig Dispense Refill  . Biotin 5000 MCG TABS Take 5,000 mcg by mouth daily.    . bisoprolol (ZEBETA) 5 MG tablet Take 5 mg by mouth daily.    . Calcium Carb-Cholecalciferol 600-200 MG-UNIT TABS Take 1 tablet by mouth daily.    Marland Kitchen CARTIA XT 120 MG 24 hr capsule Take 120 mg by mouth every morning.     .  citalopram (CELEXA) 10 MG tablet Take 10 mg by mouth daily.    . clobetasol ointment (TEMOVATE) 3.53 % Apply 1 application topically 2 (two) times daily as needed (for itching).    Marland Kitchen esomeprazole (NEXIUM) 40 MG capsule Take 40 mg by mouth at bedtime.     . eszopiclone (LUNESTA) 2 MG TABS tablet Take 1 tablet (2 mg total) by mouth at bedtime as needed for sleep. Take immediately before bedtime 30 tablet 2  . ezetimibe (ZETIA) 10 MG tablet Take 10 mg by mouth at bedtime.     . flecainide (TAMBOCOR) 50 MG tablet Take 1 tablet (50 mg total) by mouth every 12 (twelve) hours. 60 tablet 5  . gabapentin (NEURONTIN) 600 MG tablet TAKE 1 TABLET BY MOUTH THREE TIMES DAILY 90 tablet 4  . hydrOXYzine (ATARAX/VISTARIL) 10 MG tablet Take 20-30 mg by mouth at bedtime. For itching or anxiety  1  . MEGARED OMEGA-3 KRILL OIL 500 MG CAPS Take 1 tablet by mouth daily.    . Multiple Vitamins-Minerals (CENTRUM SILVER ULTRA WOMENS) TABS Take 1 tablet by mouth daily.    Marland Kitchen NEXIUM 40 MG capsule TAKE 1 CAPSULE BY MOUTH EVERY DAY AT 2PM (Patient taking differently: TAKE 1 CAPSULE BY MOUTH EVERY night) 30 capsule 11  . nortriptyline (PAMELOR) 25 MG capsule Take 2 capsules (50 mg total) by mouth at bedtime. 60 capsule 1  . ranitidine (ZANTAC) 300 MG tablet TAKE 1 TABLET BY MOUTH AT BEDTIME 30 tablet 5  . simvastatin (ZOCOR) 40 MG tablet Take 1 tablet (40 mg total) by mouth every evening. 30 tablet 6  . apixaban (ELIQUIS) 5 MG TABS tablet Take 1 tablet (5 mg total) by mouth 2 (two) times daily. 60 tablet 5   No current facility-administered medications for this visit.    Allergies:   Cefuroxime axetil; Macrodantin; Alendronate sodium; Codeine; Demerol; Meperidine hcl; Other; and Sulfonamide derivatives    Social History:  The patient  reports that she has quit smoking. Her smoking use included Cigarettes. She has a 1 pack-year smoking history. She has never used smokeless tobacco. She reports that she drinks alcohol. She  reports that she does not use illicit drugs.   Family History:  The patient's family history includes Anxiety disorder in her mother; Bipolar disorder in her mother; Colon cancer in her father; HIV in her son; Heart disease in her maternal grandmother; Mental illness in her mother; Stomach cancer in her maternal grandfather, mother, and paternal grandmother; Stroke in her father; Throat cancer in her maternal uncle. There is no history of Anesthesia problems or Diabetes.    ROS:  Please see the history of present illness.  Otherwise, review of systems are positive for none.   All other systems are reviewed and negative.    PHYSICAL EXAM: VS:  BP 126/70 mmHg  Pulse 53  Ht 5\' 1"  (1.549 m)  Wt 144 lb (65.318 kg)  BMI 27.22 kg/m2 , BMI Body mass index is 27.22 kg/(m^2). GEN: Well nourished, well developed, in no acute distress HEENT: normal Neck: no JVD, carotid bruits, or masses Cardiac: RRR; no murmurs, rubs, or gallops,no edema  Respiratory:  clear to auscultation bilaterally, normal work of breathing GI: soft, nontender, nondistended, + BS MS: no deformity or atrophy Skin: warm and dry, no rash Neuro:  Strength and sensation are intact Psych: euthymic mood, full affect   EKG:  EKG is ordered today. The ekg ordered today demonstrates sinus bradycardia with first-degree AV block.  PR interval 226 ms.  QTC is normal at 435 ms.  No ischemic changes.   Recent Labs: 01/29/2015: ALT 17 02/19/2015: Magnesium 1.8; TSH 5.792* 02/20/2015: B Natriuretic Peptide 125.1*; BUN 10; Creatinine, Ser 0.58; Hemoglobin 13.6; Platelets 234; Potassium 3.4*; Sodium 136    Lipid Panel    Component Value Date/Time   CHOL 187 08/03/2014 1053   TRIG 150.0* 08/03/2014 1053   TRIG 133 06/30/2006 1110   HDL 45.80 08/03/2014 1053   CHOLHDL 4 08/03/2014 1053   CHOLHDL 3.1 CALC 06/30/2006 1110   VLDL 30.0 08/03/2014 1053   LDLCALC 111* 08/03/2014 1053   LDLDIRECT 93.3 08/04/2008 0915      Wt Readings  from Last 3 Encounters:  03/01/15 144 lb (65.318 kg)  02/19/15 320 lb 8.8 oz (145.4 kg)  02/08/15 147 lb 12.8 oz (67.042 kg)         ASSESSMENT AND PLAN:  1.  Paroxysmal atrial fibrillation..  Recurrence on 02/19/15 with rapid ventricular response.  He is now on flecainide 50 mg twice a day. Her Chadds vascular score is 4 and she will remain on long-term anticoagulation, and we are switching her back from warfarin to Apixaban 5 mg twice a day 2. HLD - On statin therapy  3. Hypothyroidism  4. Osteoarthritis of right knee  5. Sinus bradycardia and malaise/fatigue  Disposition: Continue current regimen.  She may have only one glass of wine  nightly.  recheck here for follow-up office visit and EKG in 2 months , or sooner if necessary   Current medicines are reviewed at length with the patient today.  The patient does not have concerns regarding medicines.  The following changes have been made:  no change  Labs/ tests ordered today include:   Orders Placed This Encounter  Procedures  . EKG 12-Lead      Signed, Darlin Coco MD 03/01/2015 5:32 PM    [facility] [place], [place], [place]  [address] Phone: [phone]; Fax: [phone]

## 2015-03-01 NOTE — Patient Instructions (Signed)
Medication Instructions:  STOP WARFARIN STARTING TODAY  START ELIQUIS 5 MG TWICE A DAY STARTING TOMORROW  Labwork: NONE  Testing/Procedures: NONE  Follow-Up: KEEP YOUR AUGUST APPOINTMENT

## 2015-03-02 ENCOUNTER — Telehealth: Payer: Self-pay

## 2015-03-02 NOTE — Telephone Encounter (Signed)
Approval from [facility] for patient's Eliquis 5mg  bid good through 03/01/2016. [place] #[id number]. Pharmacy notified as well.

## 2015-03-02 NOTE — Telephone Encounter (Signed)
Prior auth for Eliquis 5mg  sent to [facility] via Cover My Meds.

## 2015-03-05 ENCOUNTER — Other Ambulatory Visit: Payer: Self-pay | Admitting: Family Medicine

## 2015-03-06 DIAGNOSIS — Z1231 Encounter for screening mammogram for malignant neoplasm of breast: Secondary | ICD-10-CM | POA: Diagnosis not present

## 2015-03-08 DIAGNOSIS — M810 Age-related osteoporosis without current pathological fracture: Secondary | ICD-10-CM | POA: Diagnosis not present

## 2015-03-13 DIAGNOSIS — M810 Age-related osteoporosis without current pathological fracture: Secondary | ICD-10-CM | POA: Diagnosis not present

## 2015-03-20 ENCOUNTER — Other Ambulatory Visit: Payer: Self-pay | Admitting: Family Medicine

## 2015-03-21 ENCOUNTER — Encounter: Payer: Self-pay | Admitting: Neurology

## 2015-03-21 ENCOUNTER — Other Ambulatory Visit (INDEPENDENT_AMBULATORY_CARE_PROVIDER_SITE_OTHER): Payer: Medicare Other

## 2015-03-21 ENCOUNTER — Ambulatory Visit (INDEPENDENT_AMBULATORY_CARE_PROVIDER_SITE_OTHER): Payer: Medicare Other | Admitting: Neurology

## 2015-03-21 VITALS — BP 140/78 | HR 56 | Resp 16 | Ht 61.0 in | Wt 144.7 lb

## 2015-03-21 DIAGNOSIS — R7989 Other specified abnormal findings of blood chemistry: Secondary | ICD-10-CM

## 2015-03-21 DIAGNOSIS — G3184 Mild cognitive impairment, so stated: Secondary | ICD-10-CM

## 2015-03-21 DIAGNOSIS — R946 Abnormal results of thyroid function studies: Secondary | ICD-10-CM

## 2015-03-21 LAB — T3, FREE: T3, Free: 5.1 pg/mL — ABNORMAL HIGH (ref 2.3–4.2)

## 2015-03-21 LAB — VITAMIN B12: Vitamin B-12: 740 pg/mL (ref 211–911)

## 2015-03-21 LAB — TSH: TSH: 3.2 u[IU]/mL (ref 0.35–4.50)

## 2015-03-21 LAB — T4, FREE: Free T4: 1.36 ng/dL (ref 0.60–1.60)

## 2015-03-21 NOTE — Patient Instructions (Signed)
We will check MRI of the brain without contrast We will check vitamin B12 level and Thyroid Panel (TSH, free T3, free T4) Follow up in 6 months.

## 2015-03-21 NOTE — Progress Notes (Signed)
NEUROLOGY CONSULTATION NOTE  Chelsea Hancock MRN: [id number] DOB: 1934-03-25  Referring provider: Dr. Felipa Eth Primary care provider: Dr. Felipa Eth  Reason for consult:  Memory problems.  HISTORY OF PRESENT ILLNESS: Chelsea Hancock is a 81 year old right-handed woman with hypertension, hyperlipidemia, paroxysmal atrial fibrillation and anemia who presents for memory problems.  Records and labs reviewed.  She is accompanied by her daughter who provides some history.  She started to have some memory problems about 2 years ago.  She would go to a room or closet and forget what she was looking for.  She repeats stories and questions.  She sometimes forgets appointments.  Sometimes she would confabulate if she cannot remember what is being asked (such as what the family is having for dinner).  She began to have problems with finances such as correctly calculating the bills.  Her daughter took over paying the bills in March.  She lives by herself.  She keeps the house tidy and tends to the yard.  She is able to perform ADLs.  She is not incontinent.  She uses a pillbox which she sets up herself.  Only occasionally, she may miss a morning medication.  She is a little more irritable and anxious but her behavior and personality are overall unchanged.  She does not exhibit hallucinations.  She drives without difficulty (not disoriented, no near-accidents).  Over the past year, she has had several surgeries involving her cervical spine, lumbar spine, knee, as well as ulnar nerve and carpal tunnel surgery.  She has a right dropped foot.  This, in addition to her knee problems, causes difficulty with balance.  She has had several falls.  On a couple of occasions, she hit her head, although not severe or with loss of consciousness.   She has known thyroid nodules.  Prior biopsy showed it to be benign.  Recent CT of neck showed it to be unchanged.  TSH from last month was 5.792.  She is also on  several medications, many for several years.  She was recently taken off Lunesta and Celexa.  She continues to take nortriptyline and gabapentin.  There is no known family history of dementia.  PAST MEDICAL HISTORY: Past Medical History  Diagnosis Date  . Hyperlipidemia     takes Zetia and Simvastatin nightly  . Palpitation   . Carotid bruit     Right  . Edema   . Arthritis   . Allergy     portabella mushrooms/diarrhea  . Heart murmur   . Osteoporosis   . Fibromyalgia   . Thrush 03/23/2012  . Vaginitis 03/23/2012  . Sigmoid colon ulcer 03/23/2012  . Hyperglycemia 04/27/2012  . History of migraine     last about 2yrs ago  . Foot drop, right   . Joint pain   . Joint swelling   . Chronic back pain   . Skin problem in pregnancy     takes gabapentin 3 times a day and also has an ointment  . GERD (gastroesophageal reflux disease)     takes Zantac and Nexium daily  . Hemorrhoids   . Diverticulosis   . IBS (irritable bowel syndrome)     takes align daily  . Hypocalcemia 03/29/2013  . Depression with anxiety 03/29/2013    hx of  . Tachycardia 07/03/2013  . Onychomycosis 07/03/2013  . Adenomatous polyp of colon 1991, 2008  . Medicare annual wellness visit, subsequent 08/13/2014  . Dysrhythmia     paroxysmal A fib, irregular  heartbeat;takes Bisoprolol nightly  . Migraine 04/27/2012    last 40 years ago  . Falls frequently   . Memory loss 02/11/2015    PAST SURGICAL HISTORY: Past Surgical History  Procedure Laterality Date  . Abdominal hysterectomy  1981  . Lumbar laminectomy  1960    x 2  . Veins stripped  1970  . Total hip arthroplasty Left 2012  . Carpal tunnel release Left 2012    ulnar nerve release at elbow  . Cervical fusion  2003, 2005, 2013  . Tonsillectomy  1943  . Left elbow surgery  06/03/11    cyst removed  . Colonoscopy    . Posterior cervical fusion/foraminotomy  08/03/2012    Procedure: POSTERIOR CERVICAL FUSION/FORAMINOTOMY LEVEL 5;  Surgeon: [name],  MD;  Location: [facility];  Service: Neurosurgery;  Laterality: N/A;  Cervical four to Thoracic two Posterior cervical decompression with Facet and Pedicle screw fixation  . Back surgery  1997    reconstructive lower back surgery  . Wrist surgery Right 1990's    cyst removed  . Eye surgery  2015    b/l cataracts removed  . Total knee arthroplasty Right 11/20/2014    Procedure: RIGHT TOTAL KNEE ARTHROPLASTY;  Surgeon: [name], MD;  Location: [facility];  Service: Orthopedics;  Laterality: Right;    MEDICATIONS: Current Outpatient Prescriptions on File Prior to Visit  Medication Sig Dispense Refill  . apixaban (ELIQUIS) 5 MG TABS tablet Take 1 tablet (5 mg total) by mouth 2 (two) times daily. 60 tablet 5  . Biotin 5000 MCG TABS Take 5,000 mcg by mouth daily.    . bisoprolol (ZEBETA) 5 MG tablet Take 5 mg by mouth daily.    . Calcium Carb-Cholecalciferol 600-200 MG-UNIT TABS Take 1 tablet by mouth daily.    Marland Kitchen CARTIA XT 120 MG 24 hr capsule Take 120 mg by mouth every morning.     . clobetasol ointment (TEMOVATE) 2.37 % Apply 1 application topically 2 (two) times daily as needed (for itching).    Marland Kitchen esomeprazole (NEXIUM) 40 MG capsule Take 40 mg by mouth at bedtime.     . eszopiclone (LUNESTA) 2 MG TABS tablet Take 1 tablet (2 mg total) by mouth at bedtime as needed for sleep. Take immediately before bedtime 30 tablet 2  . ezetimibe (ZETIA) 10 MG tablet Take 10 mg by mouth at bedtime.     . flecainide (TAMBOCOR) 50 MG tablet Take 1 tablet (50 mg total) by mouth every 12 (twelve) hours. 60 tablet 5  . gabapentin (NEURONTIN) 600 MG tablet TAKE 1 TABLET BY MOUTH THREE TIMES DAILY 90 tablet 4  . hydrOXYzine (ATARAX/VISTARIL) 10 MG tablet Take 20-30 mg by mouth at bedtime. For itching or anxiety  1  . hydrOXYzine (ATARAX/VISTARIL) 10 MG tablet TAKE 1 TABLET BY MOUTH EVERY 6 HOURS AS NEEDED FOR ITCHING OR ANXIETY 60 tablet 0  . MEGARED OMEGA-3 KRILL OIL 500 MG CAPS Take 1 tablet by mouth daily.     . Multiple Vitamins-Minerals (CENTRUM SILVER ULTRA WOMENS) TABS Take 1 tablet by mouth daily.    Marland Kitchen NEXIUM 40 MG capsule TAKE 1 CAPSULE BY MOUTH EVERY DAY AT 2PM (Patient taking differently: TAKE 1 CAPSULE BY MOUTH EVERY night) 30 capsule 11  . nortriptyline (PAMELOR) 25 MG capsule TAKE 2 CAPSULES BY MOUTH EVERY NIGHT AT BEDTIME 60 capsule 1  . ranitidine (ZANTAC) 300 MG tablet TAKE 1 TABLET BY MOUTH AT BEDTIME 30 tablet 5  . simvastatin (  ZOCOR) 40 MG tablet Take 1 tablet (40 mg total) by mouth every evening. 30 tablet 6  . citalopram (CELEXA) 10 MG tablet Take 10 mg by mouth daily.     No current facility-administered medications on file prior to visit.    ALLERGIES: Allergies  Allergen Reactions  . Cefuroxime Axetil Anaphylaxis  . Macrodantin Anaphylaxis  . Alendronate Sodium Other (See Comments)    Severe chest pain similar to heart attack   . Codeine Nausea And Vomiting  . Demerol [Meperidine] Other (See Comments)    hallucinations  . Meperidine Hcl Nausea And Vomiting  . Other Other (See Comments)    Hismanal and Maprobamate  portobello mushrooms - diarrhea  . Sulfonamide Derivatives Hives, Itching and Swelling    FAMILY HISTORY: Family History  Problem Relation Age of Onset  . Bipolar disorder Mother   . Stomach cancer Mother   . Mental illness Mother   . Stroke Father   . Colon cancer Father   . Throat cancer Maternal Uncle     larynx  . HIV Son     Aids  . Anesthesia problems Neg Hx   . Stomach cancer Maternal Grandfather   . Stomach cancer Paternal Grandmother   . Anxiety disorder Mother   . Diabetes Neg Hx   . Heart disease Maternal Grandmother     SOCIAL HISTORY: History   Social History  . Marital Status: Divorced    Spouse Name: N/A  . Number of Children: N/A  . Years of Education: N/A   Occupational History  . Not on file.   Social History Main Topics  . Smoking status: Former Smoker -- 0.25 packs/day for 4 years    Types: Cigarettes  .  Smokeless tobacco: Never Used     Comment: quit 20+yrs ago  . Alcohol Use: 0.0 oz/week    0 Standard drinks or equivalent per week     Comment: [place]. 2 [employer]  . Drug Use: No  . Sexual Activity:    Partners: Male   Other Topics Concern  . Not on file   Social History Narrative    REVIEW OF SYSTEMS: Constitutional: No fevers, chills, or sweats, no generalized fatigue, change in appetite Eyes: No visual changes, double vision, eye pain Ear, nose and throat: No hearing loss, ear pain, nasal congestion, sore throat Cardiovascular: No chest pain, palpitations Respiratory:  No shortness of breath at rest or with exertion, wheezes GastrointestinaI: No nausea, vomiting, diarrhea, abdominal pain, fecal incontinence Genitourinary:  No dysuria, urinary retention or frequency Musculoskeletal:  No neck pain, back pain Integumentary: No rash, pruritus, skin lesions Neurological: as above Psychiatric: No depression, insomnia, anxiety Endocrine: No palpitations, fatigue, diaphoresis, mood swings, change in appetite, change in weight, increased thirst Hematologic/Lymphatic:  No anemia, purpura, petechiae. Allergic/Immunologic: no itchy/runny eyes, nasal congestion, recent allergic reactions, rashes  PHYSICAL EXAM: Filed Vitals:   03/21/15 0820  BP: 140/78  Pulse: 56  Resp: 16   General: No acute distress Head:  Normocephalic/atraumatic Eyes:  fundi unremarkable, without vessel changes, exudates, hemorrhages or papilledema. Neck: supple, no paraspinal tenderness, full range of motion Back: No paraspinal tenderness Heart: regular rate and rhythm Lungs: Clear to auscultation bilaterally. Vascular: No carotid bruits. Neurological Exam: Mental status: alert and oriented to person, place, and time, delayed recall poor, remote memory intact, fund of knowledge intact, attention and concentration intact, speech fluent and not dysarthric, language intact. Montreal Cognitive  Assessment  03/21/2015  Visuospatial/ Executive (0/5) 3  Naming (0/3) 2  Attention: Read list of digits (0/2) 2  Attention: Read list of letters (0/1) 1  Attention: Serial 7 subtraction starting at 100 (0/3) 3  Language: Repeat phrase (0/2) 2  Language : Fluency (0/1) 1  Abstraction (0/2) 2  Delayed Recall (0/5) 1  Orientation (0/6) 5  Total 22  Adjusted Score (based on education) 22   Cranial nerves: CN I: not tested CN II: pupils equal, round and reactive to light, visual fields intact, fundi unremarkable, without vessel changes, exudates, hemorrhages or papilledema. CN III, IV, VI:  full range of motion, no nystagmus, no ptosis CN V: facial sensation intact CN VII: upper and lower face symmetric CN VIII: hearing intact CN IX, X: gag intact, uvula midline CN XI: sternocleidomastoid and trapezius muscles intact CN XII: tongue midline Bulk & Tone: normal, no fasciculations. Motor:  Right foot drop.  5-/5 in grip bilaterally.  Otherwise, 5/5 Sensation:  Temperature and vibration intact. Deep Tendon Reflexes:  2+ in brachioradialis and biceps, otherwise absent.  Toes equivocal. Finger to nose testing:  No dysmetria Heel to shin:  No dysmetria Gait:  Right steppage gait. Romberg negative.  IMPRESSION: Mild cognitive impairment Elevated TSH  PLAN: 1.  Check MRI of brain without contrast 2.  Check B12.  Recheck TSH with free T3, T4. 3.  I would not use a cholinesterase inhibitor as she has history of GI bleed.  Namenda is possibility.  After discussion, will hold off on starting a medication and retest in 6 months.   4.  Consider limiting use of PPIs if possible 5.  Follow up in 6 months  Thank you for allowing me to take part in the care of this patient.  Metta Clines, DO  CC: Lajean Manes, MD

## 2015-03-27 ENCOUNTER — Telehealth: Payer: Self-pay | Admitting: *Deleted

## 2015-03-27 NOTE — Telephone Encounter (Signed)
Patient is aware that Labs do not show B12 deficiency or hypothyroidism. As a matter of fact, one of the thyroid hormones is mildly elevated.

## 2015-03-27 NOTE — Telephone Encounter (Signed)
-----   Message from Pieter Partridge, DO sent at 03/21/2015  1:25 PM EDT ----- Labs do not show B12 deficiency or hypothyroidism.  As a matter of fact, one of the thyroid hormones is mildly elevated.

## 2015-03-29 ENCOUNTER — Other Ambulatory Visit: Payer: Medicare Other

## 2015-03-29 DIAGNOSIS — N3001 Acute cystitis with hematuria: Secondary | ICD-10-CM | POA: Diagnosis not present

## 2015-03-30 ENCOUNTER — Ambulatory Visit
Admission: RE | Admit: 2015-03-30 | Discharge: 2015-03-30 | Disposition: A | Discharge status: Home / Self Care | Payer: Medicare Other | Source: Ambulatory Visit | Attending: Neurology | Admitting: Neurology

## 2015-03-30 DIAGNOSIS — G3184 Mild cognitive impairment, so stated: Secondary | ICD-10-CM

## 2015-03-30 DIAGNOSIS — R7989 Other specified abnormal findings of blood chemistry: Secondary | ICD-10-CM

## 2015-03-30 DIAGNOSIS — R413 Other amnesia: Secondary | ICD-10-CM | POA: Diagnosis not present

## 2015-04-02 ENCOUNTER — Telehealth: Payer: Self-pay | Admitting: Neurology

## 2015-04-02 ENCOUNTER — Telehealth: Payer: Self-pay | Admitting: *Deleted

## 2015-04-02 NOTE — Telephone Encounter (Signed)
Patient request to call office for MRI results

## 2015-04-02 NOTE — Telephone Encounter (Signed)
VM-pt called and said she was returning your call/Dawn CB#[phone]

## 2015-04-02 NOTE — Telephone Encounter (Signed)
MRI shows some chronic changes, such as mild shrinkage of brain and small vessel changes due to longstanding medical conditions such as high blood pressure and high cholesterol. Left message for patient to return my call

## 2015-04-02 NOTE — Telephone Encounter (Signed)
I returned patients calls  Regarding MRI results

## 2015-04-03 ENCOUNTER — Other Ambulatory Visit: Payer: Self-pay | Admitting: Family Medicine

## 2015-04-03 ENCOUNTER — Telehealth: Payer: Self-pay | Admitting: *Deleted

## 2015-04-03 NOTE — Telephone Encounter (Signed)
Patient is aware of results.

## 2015-04-03 NOTE — Telephone Encounter (Signed)
-----   Message from Pieter Partridge, DO sent at 04/02/2015  8:56 AM EDT ----- MRI shows some chronic changes, such as mild shrinkage of brain and small vessel changes due to longstanding medical conditions such as high blood pressure and high cholesterol.

## 2015-04-06 ENCOUNTER — Other Ambulatory Visit: Payer: Self-pay | Admitting: Family Medicine

## 2015-04-24 ENCOUNTER — Other Ambulatory Visit: Payer: Self-pay | Admitting: Family Medicine

## 2015-04-27 DIAGNOSIS — K219 Gastro-esophageal reflux disease without esophagitis: Secondary | ICD-10-CM | POA: Diagnosis not present

## 2015-04-27 DIAGNOSIS — E780 Pure hypercholesterolemia: Secondary | ICD-10-CM | POA: Diagnosis not present

## 2015-04-27 DIAGNOSIS — M791 Myalgia: Secondary | ICD-10-CM | POA: Diagnosis not present

## 2015-04-27 DIAGNOSIS — G4700 Insomnia, unspecified: Secondary | ICD-10-CM | POA: Diagnosis not present

## 2015-04-27 DIAGNOSIS — I480 Paroxysmal atrial fibrillation: Secondary | ICD-10-CM | POA: Diagnosis not present

## 2015-04-27 DIAGNOSIS — F419 Anxiety disorder, unspecified: Secondary | ICD-10-CM | POA: Diagnosis not present

## 2015-05-08 ENCOUNTER — Emergency Department (HOSPITAL_COMMUNITY)
Admission: EM | Admit: 2015-05-08 | Discharge: 2015-05-08 | Disposition: A | Discharge status: Home / Self Care | Payer: Medicare Other | Attending: Emergency Medicine | Admitting: Emergency Medicine

## 2015-05-08 ENCOUNTER — Encounter (HOSPITAL_COMMUNITY): Payer: Self-pay | Admitting: *Deleted

## 2015-05-08 ENCOUNTER — Emergency Department (HOSPITAL_COMMUNITY): Payer: Medicare Other

## 2015-05-08 DIAGNOSIS — R011 Cardiac murmur, unspecified: Secondary | ICD-10-CM | POA: Insufficient documentation

## 2015-05-08 DIAGNOSIS — F419 Anxiety disorder, unspecified: Secondary | ICD-10-CM | POA: Diagnosis not present

## 2015-05-08 DIAGNOSIS — R531 Weakness: Secondary | ICD-10-CM | POA: Diagnosis not present

## 2015-05-08 DIAGNOSIS — I4891 Unspecified atrial fibrillation: Secondary | ICD-10-CM | POA: Diagnosis not present

## 2015-05-08 DIAGNOSIS — K219 Gastro-esophageal reflux disease without esophagitis: Secondary | ICD-10-CM | POA: Insufficient documentation

## 2015-05-08 DIAGNOSIS — G43909 Migraine, unspecified, not intractable, without status migrainosus: Secondary | ICD-10-CM | POA: Diagnosis not present

## 2015-05-08 DIAGNOSIS — I483 Typical atrial flutter: Secondary | ICD-10-CM | POA: Insufficient documentation

## 2015-05-08 DIAGNOSIS — K589 Irritable bowel syndrome without diarrhea: Secondary | ICD-10-CM | POA: Insufficient documentation

## 2015-05-08 DIAGNOSIS — Z8742 Personal history of other diseases of the female genital tract: Secondary | ICD-10-CM | POA: Insufficient documentation

## 2015-05-08 DIAGNOSIS — Z87891 Personal history of nicotine dependence: Secondary | ICD-10-CM | POA: Diagnosis not present

## 2015-05-08 DIAGNOSIS — G8929 Other chronic pain: Secondary | ICD-10-CM | POA: Insufficient documentation

## 2015-05-08 DIAGNOSIS — Z872 Personal history of diseases of the skin and subcutaneous tissue: Secondary | ICD-10-CM | POA: Diagnosis not present

## 2015-05-08 DIAGNOSIS — Z7901 Long term (current) use of anticoagulants: Secondary | ICD-10-CM | POA: Insufficient documentation

## 2015-05-08 DIAGNOSIS — Z8619 Personal history of other infectious and parasitic diseases: Secondary | ICD-10-CM | POA: Diagnosis not present

## 2015-05-08 DIAGNOSIS — I480 Paroxysmal atrial fibrillation: Secondary | ICD-10-CM | POA: Insufficient documentation

## 2015-05-08 DIAGNOSIS — M1990 Unspecified osteoarthritis, unspecified site: Secondary | ICD-10-CM | POA: Diagnosis not present

## 2015-05-08 DIAGNOSIS — Z86010 Personal history of colonic polyps: Secondary | ICD-10-CM | POA: Diagnosis not present

## 2015-05-08 DIAGNOSIS — E785 Hyperlipidemia, unspecified: Secondary | ICD-10-CM | POA: Insufficient documentation

## 2015-05-08 DIAGNOSIS — I499 Cardiac arrhythmia, unspecified: Secondary | ICD-10-CM | POA: Diagnosis not present

## 2015-05-08 DIAGNOSIS — Z79899 Other long term (current) drug therapy: Secondary | ICD-10-CM | POA: Diagnosis not present

## 2015-05-08 DIAGNOSIS — I4892 Unspecified atrial flutter: Secondary | ICD-10-CM | POA: Diagnosis not present

## 2015-05-08 LAB — BASIC METABOLIC PANEL
Anion gap: 9 (ref 5–15)
BUN: 15 mg/dL (ref 6–20)
CO2: 22 mmol/L (ref 22–32)
Calcium: 8.7 mg/dL — ABNORMAL LOW (ref 8.9–10.3)
Chloride: 105 mmol/L (ref 101–111)
Creatinine, Ser: 0.74 mg/dL (ref 0.44–1.00)
GFR calc Af Amer: >60 mL/min (ref >60)
GFR calc non Af Amer: >60 mL/min (ref >60)
Glucose, Bld: 111 mg/dL — ABNORMAL HIGH (ref 65–99)
Potassium: 3.8 mmol/L (ref 3.5–5.1)
Sodium: 136 mmol/L (ref 135–145)

## 2015-05-08 LAB — CBC
HCT: 42.1 % (ref 36.0–46.0)
Hemoglobin: 14 g/dL (ref 12.0–15.0)
MCH: 30 pg (ref 26.0–34.0)
MCHC: 33.3 g/dL (ref 30.0–36.0)
MCV: 90.1 fL (ref 78.0–100.0)
Platelets: 206 10*3/uL (ref 150–400)
RBC: 4.67 MIL/uL (ref 3.87–5.11)
RDW: 14.5 % (ref 11.5–15.5)
WBC: 6.3 10*3/uL (ref 4.0–10.5)

## 2015-05-08 MED ORDER — METOPROLOL TARTRATE 1 MG/ML IV SOLN
5.0000 mg | Freq: Once | INTRAVENOUS | Status: AC
  Administered 2015-05-08: 5 mg via INTRAVENOUS
  Filled 2015-05-08: qty 5

## 2015-05-08 MED ORDER — FLECAINIDE ACETATE 100 MG PO TABS
100.0000 mg | ORAL_TABLET | Freq: Once | ORAL | Status: AC
  Administered 2015-05-08: 100 mg via ORAL
  Filled 2015-05-08: qty 1

## 2015-05-08 MED ORDER — SODIUM CHLORIDE 0.9 % IV BOLUS (SEPSIS)
500.0000 mL | Freq: Once | INTRAVENOUS | Status: AC
  Administered 2015-05-08: 500 mL via INTRAVENOUS

## 2015-05-08 MED ORDER — DILTIAZEM HCL 25 MG/5ML IV SOLN
15.0000 mg | Freq: Once | INTRAVENOUS | Status: AC
  Administered 2015-05-08: 15 mg via INTRAVENOUS
  Filled 2015-05-08: qty 5

## 2015-05-08 MED ORDER — SODIUM CHLORIDE 0.9 % IV BOLUS (SEPSIS): 500.0000 mL | Freq: Once | INTRAVENOUS | Status: DC

## 2015-05-08 NOTE — ED Notes (Signed)
Pt returned from xray

## 2015-05-08 NOTE — Discharge Instructions (Signed)
Atrial Flutter °Atrial flutter is a heart rhythm that can cause the heart to beat very fast (tachycardia). It originates in the upper chambers of the heart (atria). In atrial flutter, the top chambers of the heart (atria) often beat much faster than the bottom chambers of the heart (ventricles). Atrial flutter has a regular "saw toothed" appearance in an EKG readout. An EKG is a test that records the electrical activity of the heart. Atrial flutter can cause the heart to beat up to 150 beats per minute (BPM). Atrial flutter can either be short lived (paroxysmal) or permanent.  °CAUSES  °Causes of atrial flutter can be many. Some of these include: °· Heart related issues: °¨ Heart attack (myocardial infarction). °¨ Heart failure. °¨ Heart valve problems. °¨ Poorly controlled high blood pressure (hypertension). °¨ After open heart surgery. °· Lung related issues: °¨ A blood clot in the lungs (pulmonary embolism). °¨ Chronic obstructive pulmonary disease (COPD). Medications used to treat COPD can attribute to atrial flutter. °· Other related causes: °¨ Hyperthyroidism. °¨ Caffeine. °¨ Some decongestant cold medications. °¨ Low electrolyte levels such as potassium or magnesium. °¨ Cocaine. °SYMPTOMS °· An awareness of your heart beating rapidly (palpitations). °· Shortness of breath. °· Chest pain. °· Low blood pressure (hypotension). °· Dizziness or fainting. °DIAGNOSIS  °Different tests can be performed to diagnose atrial flutter.  °· An EKG. °· Holter monitor. This is a 24-hour recording of your heart rhythm. You will also be given a diary. Write down all symptoms that you have and what you were doing at the time you experienced symptoms. °· Cardiac event monitor. This small device can be worn for up to 30 days. When you have heart symptoms, you will push a button on the device. This will then record your heart rhythm. °· Echocardiogram. This is an imaging test to look at your heart. Your caregiver will look at your  heart valves and the ventricles. °· Stress test. This test can help determine if the atrial flutter is related to exercise or if coronary artery disease is present. °· Laboratory studies will look at certain blood levels like: °¨ Complete blood count (CBC). °¨ Potassium. °¨ Magnesium. °¨ Thyroid function. °TREATMENT  °Treatment of atrial flutter varies. A combination of therapies may be used or sometimes atrial flutter may need only 1 type of treatment.  °Lab work: °If your blood work, such as your electrolytes (potassium, magnesium) or your thyroid function tests, are abnormal, your caregiver will treat them accordingly.  °Medication:  °There are several different types of medications that can convert your heart to a normal rhythm and prevent atrial flutter from reoccurring.  °Nonsurgical procedures: °Nonsurgical techniques may be used to control atrial flutter. Some examples include: °· Cardioversion. This technique uses either drugs or an electrical shock to restore a normal heart rhythm: °¨ Cardioversion drugs may be given through an intravenous (IV) line to help "reset" the heart rhythm. °¨ In electrical cardioversion, your caregiver shocks your heart with electrical energy. This helps to reset the heartbeat to a normal rhythm. °· Ablation. If atrial flutter is a persistent problem, an ablation may be needed. This procedure is done under mild sedation. High frequency radio-wave energy is used to destroy the area of heart tissue responsible for atrial flutter. °SEEK IMMEDIATE MEDICAL CARE IF:  °You have: °· Dizziness. °· Near fainting or fainting. °· Shortness of breath. °· Chest pain or pressure. °· Sudden nausea or vomiting. °· Profuse sweating. °If you have the above symptoms,   call your local emergency service immediately! Do not drive yourself to the hospital. °MAKE SURE YOU:  °· Understand these instructions. °· Will watch your condition. °· Will get help right away if you are not doing well or get  worse. °Document Released: 01/25/2009 Document Revised: 01/23/2014 Document Reviewed: 01/25/2009 °Exit[employer]® Patient Information ©2015 [employer]. This information is not intended to replace advice given to you by your health care provider. Make sure you discuss any questions you have with your health care provider. ° °

## 2015-05-08 NOTE — ED Notes (Signed)
Patient transported to X-ray 

## 2015-05-08 NOTE — ED Notes (Signed)
Pt placed in gown, continuous BP and pulse ox. Cardiac Monitoring, EKG completed and signed by [facility]

## 2015-05-08 NOTE — ED Provider Notes (Signed)
CSN: [id number]     Arrival date & time 05/08/15  0700 History   First MD Initiated Contact with Patient 05/08/15 [phone]     Chief Complaint  Patient presents with  . Irregular Heart Beat      HPI Patient has known history of paroxysmal atrial flutter for which she follows with cardiology and is on chronic anticoagulation.  She states developing an abnormal heart rhythm last night.  She states his been persistent since then.  She denies nausea vomiting.  No chest pressure chest pain.  Denies shortness of breath.  No recent change in her diet.  She denies caffeine intake.  She continues to take one bite of chocolate every night.  She is compliant with her medications including her anticoagulants.  No other complaints.  She states she feels like her heart is racing.   Past Medical History  Diagnosis Date  . Hyperlipidemia     takes Zetia and Simvastatin nightly  . Palpitation   . Carotid bruit     Right  . Edema   . Arthritis   . Allergy     portabella mushrooms/diarrhea  . Heart murmur   . Osteoporosis   . Fibromyalgia   . Thrush 03/23/2012  . Vaginitis 03/23/2012  . Sigmoid colon ulcer 03/23/2012  . Hyperglycemia 04/27/2012  . History of migraine     last about 63yrs ago  . Foot drop, right   . Joint pain   . Joint swelling   . Chronic back pain   . Skin problem in pregnancy     takes gabapentin 3 times a day and also has an ointment  . GERD (gastroesophageal reflux disease)     takes Zantac and Nexium daily  . Hemorrhoids   . Diverticulosis   . IBS (irritable bowel syndrome)     takes align daily  . Hypocalcemia 03/29/2013  . Depression with anxiety 03/29/2013    hx of  . Tachycardia 07/03/2013  . Onychomycosis 07/03/2013  . Adenomatous polyp of colon 1991, 2008  . Medicare annual wellness visit, subsequent 08/13/2014  . Dysrhythmia     paroxysmal A fib, irregular heartbeat;takes Bisoprolol nightly  . Migraine 04/27/2012    last 40 years ago  . Falls frequently   . Memory  loss 02/11/2015   Past Surgical History  Procedure Laterality Date  . Abdominal hysterectomy  1981  . Lumbar laminectomy  1960    x 2  . Veins stripped  1970  . Total hip arthroplasty Left 2012  . Carpal tunnel release Left 2012    ulnar nerve release at elbow  . Cervical fusion  2003, 2005, 2013  . Tonsillectomy  1943  . Left elbow surgery  06/03/11    cyst removed  . Colonoscopy    . Posterior cervical fusion/foraminotomy  08/03/2012    Procedure: POSTERIOR CERVICAL FUSION/FORAMINOTOMY LEVEL 5;  Surgeon: [name], MD;  Location: [facility];  Service: Neurosurgery;  Laterality: N/A;  Cervical four to Thoracic two Posterior cervical decompression with Facet and Pedicle screw fixation  . Back surgery  1997    reconstructive lower back surgery  . Wrist surgery Right 1990's    cyst removed  . Eye surgery  2015    b/l cataracts removed  . Total knee arthroplasty Right 11/20/2014    Procedure: RIGHT TOTAL KNEE ARTHROPLASTY;  Surgeon: [name], MD;  Location: [facility];  Service: Orthopedics;  Laterality: Right;   Family History  Problem Relation Age of  Onset  . Bipolar disorder Mother   . Stomach cancer Mother   . Mental illness Mother   . Stroke Father   . Colon cancer Father   . Throat cancer Maternal Uncle     larynx  . HIV Son     Aids  . Anesthesia problems Neg Hx   . Stomach cancer Maternal Grandfather   . Stomach cancer Paternal Grandmother   . Anxiety disorder Mother   . Diabetes Neg Hx   . Heart disease Maternal Grandmother    Social History  Substance Use Topics  . Smoking status: Former Smoker -- 0.25 packs/day for 4 years    Types: Cigarettes  . Smokeless tobacco: Never Used     Comment: quit 20+yrs ago  . Alcohol Use: 0.0 oz/week    0 Standard drinks or equivalent per week     Comment: WINE WITH DINNER. 2 GLASSES RED WINE   OB History    No data available     Review of Systems  All other systems reviewed and are negative.     Allergies   Cefuroxime axetil; Macrodantin; Alendronate sodium; Codeine; Demerol; Meperidine hcl; Other; and Sulfonamide derivatives  Home Medications   Prior to Admission medications   Medication Sig Start Date End Date Taking? Authorizing Provider  apixaban (ELIQUIS) 5 MG TABS tablet Take 1 tablet (5 mg total) by mouth 2 (two) times daily. 03/01/15  Yes Darlin Coco, MD  Biotin 5000 MCG TABS Take 5,000 mcg by mouth daily.   Yes Historical Provider, MD  bisoprolol (ZEBETA) 5 MG tablet Take 5 mg by mouth daily.   Yes Historical Provider, MD  Calcium Carb-Cholecalciferol 600-200 MG-UNIT TABS Take 1 tablet by mouth daily.   Yes Historical Provider, MD  CARTIA XT 120 MG 24 hr capsule Take 120 mg by mouth every morning.  05/30/14  Yes Historical Provider, MD  eszopiclone (LUNESTA) 2 MG TABS tablet Take 1 tablet (2 mg total) by mouth at bedtime as needed for sleep. Take immediately before bedtime 02/26/15  Yes Mosie Lukes, MD  flecainide (TAMBOCOR) 50 MG tablet Take 1 tablet (50 mg total) by mouth every 12 (twelve) hours. 02/20/15  Yes Almyra Deforest, PA  gabapentin (NEURONTIN) 600 MG tablet TAKE 1 TABLET BY MOUTH THREE TIMES DAILY 09/11/14  Yes Mosie Lukes, MD  MEGARED OMEGA-3 KRILL OIL 500 MG CAPS Take 1 tablet by mouth daily.   Yes Historical Provider, MD  Multiple Vitamins-Minerals (CENTRUM SILVER ULTRA WOMENS) TABS Take 1 tablet by mouth daily.   Yes Historical Provider, MD  NEXIUM 40 MG capsule TAKE 1 CAPSULE BY MOUTH EVERY DAY AT 2PM Patient taking differently: TAKE 1 CAPSULE BY MOUTH EVERY night 02/13/15  Yes Mosie Lukes, MD  nortriptyline (PAMELOR) 25 MG capsule TAKE 2 CAPSULES BY MOUTH EVERY NIGHT AT BEDTIME 03/20/15  Yes Mosie Lukes, MD  ranitidine (ZANTAC) 300 MG tablet TAKE 1 TABLET BY MOUTH AT BEDTIME 04/24/15  Yes Mosie Lukes, MD  simvastatin (ZOCOR) 40 MG tablet Take 1 tablet (40 mg total) by mouth every evening. 02/26/15  Yes Mosie Lukes, MD  ZETIA 10 MG tablet TAKE 1 TABLET BY MOUTH EVERY  DAY 04/06/15  Yes Mosie Lukes, MD  hydrOXYzine (ATARAX/VISTARIL) 10 MG tablet TAKE 1 TABLET BY MOUTH EVERY 6 HOURS AS NEEDED FOR ITCHING OR ANXIETY Patient not taking: Reported on 05/08/2015 04/03/15   Mosie Lukes, MD   BP 99/66 mmHg  Pulse 92  Temp(Src) 97.1 F (  36.2 C) (Oral)  Resp 21  Ht 5\' 1"  (1.549 m)  Wt 144 lb (65.318 kg)  BMI 27.22 kg/m2  SpO2 97% Physical Exam  Constitutional: She is oriented to person, place, and time. She appears well-developed and well-nourished. No distress.  HENT:  Head: Normocephalic and atraumatic.  Eyes: EOM are normal.  Neck: Normal range of motion.  Cardiovascular: Normal rate, regular rhythm and normal heart sounds.   Pulmonary/Chest: Effort normal and breath sounds normal.  Abdominal: Soft. She exhibits no distension. There is no tenderness.  Musculoskeletal: Normal range of motion.  Neurological: She is alert and oriented to person, place, and time.  Skin: Skin is warm and dry.  Psychiatric: She has a normal mood and affect. Judgment normal.  Nursing note and vitals reviewed.   ED Course  Procedures (including critical care time) Labs Review Labs Reviewed  BASIC METABOLIC PANEL - Abnormal; Notable for the following:    Glucose, Bld 111 (*)    Calcium 8.7 (*)    All other components within normal limits  CBC    Imaging Review Dg Chest 2 View  05/08/2015   CLINICAL DATA:  Weakness and atrial fibrillation. Irregular heartbeat.  EXAM: CHEST  2 VIEW  COMPARISON:  02/19/2015  FINDINGS: Again noted is surgical hardware in the lower cervical spine. Lungs are clear bilaterally. Heart and mediastinum are within normal limits. Negative for pleural effusions. No acute bone abnormality.  IMPRESSION: No active cardiopulmonary disease.   Electronically Signed   By: Markus Daft M.D.   On: 05/08/2015 08:00   I, Roselie Cirigliano M, personally reviewed and evaluated these images and lab results as part of my medical decision-making.  EKG Interpretation  #1 Date/Time:  Tuesday May 08 2015 07:11:45 EDT Ventricular Rate:  103 PR Interval:  51 QRS Duration: 104 QT Interval:  450 QTC Calculation: 589 R Axis:   -3 Text Interpretation:  Ectopic atrial tachycardia, unifocal Anteroseptal  infarct, old Borderline repolarization abnormality Prolonged QT interval  Baseline wander in lead(s) V6 changed from prior ecg Confirmed by Laretha Luepke   MD, Lennette Bihari ([id number]) on 05/08/2015 7:26:58 AM  ECG interpretation #2  Date: 05/08/2015   0904am  Rate: 94  Rhythm: atrial flutter or ectopic atrial rhythm  QRS Axis: normal  Intervals: normal  ST/T Wave abnormalities: normal  Conduction Disutrbances: none  Narrative Interpretation:   Old EKG Reviewed: No significant changes noted  ECG interpretation #3   Date: 05/08/2015  Rate: 109  Rhythm: sinus tachycardia  QRS Axis: normal  Intervals: normal  ST/T Wave abnormalities: normal  Conduction Disutrbances: none  Narrative Interpretation:   Old EKG Reviewed:no longer in atrial flutter as compared to earlier ecg   ECG interpretation #4   Date: 05/08/2015  Rate: 69  Rhythm: normal sinus rhythm  QRS Axis: normal  Intervals: normal  ST/T Wave abnormalities: normal  Conduction Disutrbances: none  Narrative Interpretation:   Old EKG Reviewed: rate improved. Sinus rhythm        MDM   Final diagnoses:  None    Initial rhythm appears more like an ectopic atrial tachycardia versus atrial flutter.  Will initiate Cardizem.  Patient with known history of atrial flutter which has been paroxysmal before in the past.  She is on anticoagulation.  If we do not convert her with IV medications here in the emergency department she is a good candidate for cardioversion given her use of anticoagulants.   11:20 AM Heart rate 67.  Sinus rhythm.  Patient responded well to  additional morning dose of flecainide.  Patient be discharged home to follow-up with her cardiologist.  She may benefit from electrophysiology  consultation in the future.  Jola Schmidt, MD 05/08/15 [phone]

## 2015-05-08 NOTE — ED Notes (Signed)
Pt in from home c/o A fib onset last night with hx of the same, pt denies CP, c/o intermittent SOB, denies n/v/d, pt takes Eliquis, pt A&O x4, follows commands, speaks in complete sentences

## 2015-05-10 DIAGNOSIS — Z96651 Presence of right artificial knee joint: Secondary | ICD-10-CM | POA: Diagnosis not present

## 2015-05-10 DIAGNOSIS — Z471 Aftercare following joint replacement surgery: Secondary | ICD-10-CM | POA: Diagnosis not present

## 2015-05-11 ENCOUNTER — Other Ambulatory Visit: Payer: Self-pay | Admitting: Cardiology

## 2015-05-11 NOTE — Telephone Encounter (Signed)
REFILL 

## 2015-05-15 ENCOUNTER — Ambulatory Visit: Payer: Medicare Other | Admitting: Cardiology

## 2015-05-17 ENCOUNTER — Ambulatory Visit: Payer: Medicare Other | Admitting: Cardiology

## 2015-05-18 ENCOUNTER — Encounter: Payer: Self-pay | Admitting: Cardiology

## 2015-05-18 ENCOUNTER — Ambulatory Visit (INDEPENDENT_AMBULATORY_CARE_PROVIDER_SITE_OTHER): Payer: Medicare Other | Admitting: Cardiology

## 2015-05-18 ENCOUNTER — Other Ambulatory Visit: Payer: Self-pay | Admitting: Family Medicine

## 2015-05-18 VITALS — BP 120/62 | HR 62 | Ht 61.0 in | Wt 142.1 lb

## 2015-05-18 DIAGNOSIS — I480 Paroxysmal atrial fibrillation: Secondary | ICD-10-CM

## 2015-05-18 DIAGNOSIS — Z7901 Long term (current) use of anticoagulants: Secondary | ICD-10-CM | POA: Diagnosis not present

## 2015-05-18 DIAGNOSIS — I10 Essential (primary) hypertension: Secondary | ICD-10-CM

## 2015-05-18 NOTE — Patient Instructions (Signed)
Medication Instructions:  Your physician recommends that you continue on your current medications as directed. Please refer to the Current Medication list given to you today.  Labwork: none  Testing/Procedures: none  Follow-Up: Your physician recommends that you schedule a follow-up appointment in: 4 month ov/ekg

## 2015-05-18 NOTE — Progress Notes (Signed)
Cardiology Office Note   Date:  05/18/2015   ID:  Chelsea Hancock, DOB 1934/04/02, MRN [id number]  PCP:  [name], MD  Cardiologist: Darlin Coco MD  No chief complaint on file.     History of Present Illness: Chelsea Hancock is a 81 y.o. female who presents for cardiology follow-up. She has a history of paroxysmal atrial flutter fibrillation. She was recently readmitted to [facility] on 02/19/15 for recurrent atrial fibrillation with rapid ventricular response. She was placed on IV diltiazem and converted overnight. She had a 5 second pause at the time of conversion. She was discharged on 02/20/15 on flecainide 50 mg twice a day in addition to bisoprolol and diltiazem. . She had a treadmill stress test on 02/26/15 which showed no ischemia or arrhythmias.  The patient was doing well until the evening of 05/07/15 when she went back into atrial fibrillation with rapid ventricular response.  Early the next morning she had a neighbor drive her to the emergency room.  She was given IV diltiazem and she was given her morning dose of flecainide early and she converted to normal sinus rhythm and was able to be discharged without having to be admitted..  the patient has normal left ventricular ejection fraction by echocardiogram. She is not physically active. In February 2016 the patient underwent a right total knee replacement by Dr.Aluisio. She did not have any arrhythmia problems during surgery but did have problems with low blood pressure. The patient was admitted to the hospital May 1 and discharge 01/23/2015 for weakness dehydration and low blood pressure.   Past Medical History  Diagnosis Date  . Hyperlipidemia     takes Zetia and Simvastatin nightly  . Palpitation   . Carotid bruit     Right  . Edema   . Arthritis   . Allergy     portabella mushrooms/diarrhea  . Heart murmur   . Osteoporosis   . Fibromyalgia   . Thrush 03/23/2012  .  Vaginitis 03/23/2012  . Sigmoid colon ulcer 03/23/2012  . Hyperglycemia 04/27/2012  . History of migraine     last about 38yrs ago  . Foot drop, right   . Joint pain   . Joint swelling   . Chronic back pain   . Skin problem in pregnancy     takes gabapentin 3 times a day and also has an ointment  . GERD (gastroesophageal reflux disease)     takes Zantac and Nexium daily  . Hemorrhoids   . Diverticulosis   . IBS (irritable bowel syndrome)     takes align daily  . Hypocalcemia 03/29/2013  . Depression with anxiety 03/29/2013    hx of  . Tachycardia 07/03/2013  . Onychomycosis 07/03/2013  . Adenomatous polyp of colon 1991, 2008  . Medicare annual wellness visit, subsequent 08/13/2014  . Dysrhythmia     paroxysmal A fib, irregular heartbeat;takes Bisoprolol nightly  . Migraine 04/27/2012    last 40 years ago  . Falls frequently   . Memory loss 02/11/2015    Past Surgical History  Procedure Laterality Date  . Abdominal hysterectomy  1981  . Lumbar laminectomy  1960    x 2  . Veins stripped  1970  . Total hip arthroplasty Left 2012  . Carpal tunnel release Left 2012    ulnar nerve release at elbow  . Cervical fusion  2003, 2005, 2013  . Tonsillectomy  1943  . Left elbow surgery  06/03/11  cyst removed  . Colonoscopy    . Posterior cervical fusion/foraminotomy  08/03/2012    Procedure: POSTERIOR CERVICAL FUSION/FORAMINOTOMY LEVEL 5;  Surgeon: [name], MD;  Location: [place] [facility];  Service: Neurosurgery;  Laterality: N/A;  Cervical four to Thoracic two Posterior cervical decompression with Facet and Pedicle screw fixation  . Back surgery  1997    reconstructive lower back surgery  . Wrist surgery Right 1990's    cyst removed  . Eye surgery  2015    b/l cataracts removed  . Total knee arthroplasty Right 11/20/2014    Procedure: RIGHT TOTAL KNEE ARTHROPLASTY;  Surgeon: [name], MD;  Location: [facility];  Service: Orthopedics;  Laterality: Right;     Current Outpatient  Prescriptions  Medication Sig Dispense Refill  . apixaban (ELIQUIS) 5 MG TABS tablet Take 1 tablet (5 mg total) by mouth 2 (two) times daily. 60 tablet 5  . Biotin 5000 MCG TABS Take 5,000 mcg by mouth daily.    . bisoprolol (ZEBETA) 5 MG tablet Take 5 mg by mouth daily.    . Calcium Carb-Cholecalciferol 600-200 MG-UNIT TABS Take 1 tablet by mouth daily.    Marland Kitchen diltiazem (CARDIZEM CD) 120 MG 24 hr capsule TAKE ONE CAPSULE BY MOUTH EVERY DAY 30 capsule 1  . eszopiclone (LUNESTA) 2 MG TABS tablet Take 1 tablet (2 mg total) by mouth at bedtime as needed for sleep. Take immediately before bedtime 30 tablet 2  . flecainide (TAMBOCOR) 50 MG tablet Take 1 tablet (50 mg total) by mouth every 12 (twelve) hours. 60 tablet 5  . gabapentin (NEURONTIN) 600 MG tablet TAKE 1 TABLET BY MOUTH THREE TIMES DAILY 90 tablet 0  . MEGARED OMEGA-3 KRILL OIL 500 MG CAPS Take 1 tablet by mouth daily.    . Melatonin 5 MG CAPS Take 5 mg by mouth at bedtime as needed.    . Multiple Vitamins-Minerals (CENTRUM SILVER ULTRA WOMENS) TABS Take 1 tablet by mouth daily.    Marland Kitchen NEXIUM 40 MG capsule TAKE 1 CAPSULE BY MOUTH EVERY DAY AT 2PM (Patient taking differently: TAKE 1 CAPSULE BY MOUTH EVERY night) 30 capsule 11  . nortriptyline (PAMELOR) 25 MG capsule TAKE 2 CAPSULES BY MOUTH EVERY NIGHT AT BEDTIME 60 capsule 1  . ZETIA 10 MG tablet TAKE 1 TABLET BY MOUTH EVERY DAY 30 tablet 6   No current facility-administered medications for this visit.    Allergies:   Cefuroxime axetil; Macrodantin; Alendronate sodium; Codeine; Demerol; Meperidine hcl; Other; and Sulfonamide derivatives    Social History:  The patient  reports that she has quit smoking. Her smoking use included Cigarettes. She has a 1 pack-year smoking history. She has never used smokeless tobacco. She reports that she drinks alcohol. She reports that she does not use illicit drugs.   Family History:  The patient's family history includes Anxiety disorder in her mother;  Bipolar disorder in her mother; Colon cancer in her father; HIV in her son; Heart disease in her maternal grandmother; Mental illness in her mother; Stomach cancer in her maternal grandfather, mother, and paternal grandmother; Stroke in her father; Throat cancer in her maternal uncle. There is no history of Anesthesia problems or Diabetes.    ROS:  Please see the history of present illness.   Otherwise, review of systems are positive for none.   All other systems are reviewed and negative.    PHYSICAL EXAM: VS:  BP 120/62 mmHg  Pulse 62  Ht 5\' 1"  (1.549 m)  Wt 142 lb 1.9 oz (64.465 kg)  BMI 26.87 kg/m2  SpO2 94% , BMI Body mass index is 26.87 kg/(m^2). GEN: Well nourished, well developed, in no acute distress HEENT: normal Neck: no JVD, carotid bruits, or masses Cardiac: RRR; no murmurs, rubs, or gallops,no edema  Respiratory:  clear to auscultation bilaterally, normal work of breathing GI: soft, nontender, nondistended, + BS MS: no deformity or atrophy Skin: warm and dry, no rash Neuro:  Strength and sensation are intact Psych: euthymic mood, full affect   EKG:  EKG is ordered today. The ekg ordered today demonstrates sinus bradycardia with first-degree AV block.  PR interval is 228 ms.  The QTc interval is normal at 437.   Recent Labs: 01/29/2015: ALT 17 02/19/2015: Magnesium 1.8 02/20/2015: B Natriuretic Peptide 125.1* 03/21/2015: TSH 3.20 05/08/2015: BUN 15; Creatinine, Ser 0.74; Hemoglobin 14.0; Platelets 206; Potassium 3.8; Sodium 136    Lipid Panel    Component Value Date/Time   CHOL 187 08/03/2014 1053   TRIG 150.0* 08/03/2014 1053   TRIG 133 06/30/2006 1110   HDL 45.80 08/03/2014 1053   CHOLHDL 4 08/03/2014 1053   CHOLHDL 3.1 CALC 06/30/2006 1110   VLDL 30.0 08/03/2014 1053   LDLCALC 111* 08/03/2014 1053   LDLDIRECT 93.3 08/04/2008 0915      Wt Readings from Last 3 Encounters:  05/18/15 142 lb 1.9 oz (64.465 kg)  05/08/15 144 lb (65.318 kg)  03/21/15 144 lb  11.2 oz (65.635 kg)      Other studies Reviewed: Additional studies/ records that were reviewed today include: Emergency room visit on 05/08/15. Review of the above records demonstrates: Conversion to normal sinus rhythm on IV diltiazem and oral flecainide.   ASSESSMENT AND PLAN:  1. Paroxysmal atrial fibrillation.. Recurrence on 02/19/15 and on 05/07/15. She is now on flecainide 50 mg twice a day. Her Chadds vascular score is 4 and she will remain on long-term anticoagulation, with Apixaban 5 mg twice a day 2. HLD - On statin therapy  3. Hypothyroidism  4. Osteoarthritis of right knee  5. Sinus bradycardia and malaise/fatigue  Disposition: Continue current regimen. She may have only one glass of wine nightly.  recheck here for follow-up office visit and EKG in 2 months , or sooner if necessary   Current medicines are reviewed at length with the patient today.  The patient does not have concerns regarding medicines.  The following changes have been made:  no change  Labs/ tests ordered today include:  No orders of the defined types were placed in this encounter.     Disposition:  Continue current medication.  Recheck in 4 months for follow-up office visit and EKG.  Berna Spare MD 05/18/2015 5:35 PM    [facility], [place], [place]  [address] Phone: [phone]; Fax: [phone]

## 2015-05-23 NOTE — Addendum Note (Signed)
Addended by: Freada Bergeron on: 05/23/2015 05:33 PM   Modules accepted: Orders

## 2015-05-25 DIAGNOSIS — H40013 Open angle with borderline findings, low risk, bilateral: Secondary | ICD-10-CM | POA: Diagnosis not present

## 2015-05-25 DIAGNOSIS — H26491 Other secondary cataract, right eye: Secondary | ICD-10-CM | POA: Diagnosis not present

## 2015-05-25 DIAGNOSIS — Z961 Presence of intraocular lens: Secondary | ICD-10-CM | POA: Diagnosis not present

## 2015-05-25 DIAGNOSIS — H26492 Other secondary cataract, left eye: Secondary | ICD-10-CM | POA: Diagnosis not present

## 2015-05-29 ENCOUNTER — Other Ambulatory Visit: Payer: Self-pay | Admitting: Family Medicine

## 2015-05-30 ENCOUNTER — Other Ambulatory Visit: Payer: Self-pay | Admitting: Family Medicine

## 2015-05-30 NOTE — Telephone Encounter (Signed)
LOV 01/29/15 Labs 05/08/15  nortriptyline (PAMELOR) 25 MG capsule [Pharmacy Med Name: NORTRIPTYLINE 25MG  CAPSULES]  TAKE 2 CAPSULES BY MOUTH EVERY NIGHT AT BEDTIME, Disp-180 capsule, R-0  **Patient requests 90 days supply**   Please advise

## 2015-06-24 DIAGNOSIS — Z23 Encounter for immunization: Secondary | ICD-10-CM | POA: Diagnosis not present

## 2015-06-29 ENCOUNTER — Other Ambulatory Visit: Payer: Self-pay | Admitting: Family Medicine

## 2015-07-05 DIAGNOSIS — N3001 Acute cystitis with hematuria: Secondary | ICD-10-CM | POA: Diagnosis not present

## 2015-07-05 DIAGNOSIS — N1330 Unspecified hydronephrosis: Secondary | ICD-10-CM | POA: Diagnosis not present

## 2015-07-06 DIAGNOSIS — M25511 Pain in right shoulder: Secondary | ICD-10-CM | POA: Diagnosis not present

## 2015-07-06 DIAGNOSIS — I480 Paroxysmal atrial fibrillation: Secondary | ICD-10-CM | POA: Diagnosis not present

## 2015-07-06 DIAGNOSIS — Z79899 Other long term (current) drug therapy: Secondary | ICD-10-CM | POA: Diagnosis not present

## 2015-07-06 DIAGNOSIS — M542 Cervicalgia: Secondary | ICD-10-CM | POA: Diagnosis not present

## 2015-07-06 DIAGNOSIS — E7800 Pure hypercholesterolemia, unspecified: Secondary | ICD-10-CM | POA: Diagnosis not present

## 2015-07-06 DIAGNOSIS — K219 Gastro-esophageal reflux disease without esophagitis: Secondary | ICD-10-CM | POA: Diagnosis not present

## 2015-07-07 ENCOUNTER — Inpatient Hospital Stay (HOSPITAL_COMMUNITY)
Admission: EM | Admit: 2015-07-07 | Discharge: 2015-07-07 | DRG: 310 | Disposition: A | Discharge status: Home / Self Care | Payer: Medicare Other | Attending: Cardiology | Admitting: Cardiology

## 2015-07-07 ENCOUNTER — Emergency Department (HOSPITAL_COMMUNITY): Payer: Medicare Other

## 2015-07-07 ENCOUNTER — Encounter (HOSPITAL_COMMUNITY): Payer: Self-pay | Admitting: Emergency Medicine

## 2015-07-07 DIAGNOSIS — M797 Fibromyalgia: Secondary | ICD-10-CM | POA: Diagnosis present

## 2015-07-07 DIAGNOSIS — M1990 Unspecified osteoarthritis, unspecified site: Secondary | ICD-10-CM | POA: Diagnosis present

## 2015-07-07 DIAGNOSIS — R000 Tachycardia, unspecified: Secondary | ICD-10-CM | POA: Diagnosis not present

## 2015-07-07 DIAGNOSIS — Z818 Family history of other mental and behavioral disorders: Secondary | ICD-10-CM | POA: Diagnosis not present

## 2015-07-07 DIAGNOSIS — Z823 Family history of stroke: Secondary | ICD-10-CM

## 2015-07-07 DIAGNOSIS — R296 Repeated falls: Secondary | ICD-10-CM | POA: Diagnosis present

## 2015-07-07 DIAGNOSIS — Z96651 Presence of right artificial knee joint: Secondary | ICD-10-CM | POA: Diagnosis present

## 2015-07-07 DIAGNOSIS — Z981 Arthrodesis status: Secondary | ICD-10-CM

## 2015-07-07 DIAGNOSIS — M810 Age-related osteoporosis without current pathological fracture: Secondary | ICD-10-CM | POA: Diagnosis present

## 2015-07-07 DIAGNOSIS — Z808 Family history of malignant neoplasm of other organs or systems: Secondary | ICD-10-CM

## 2015-07-07 DIAGNOSIS — I4891 Unspecified atrial fibrillation: Principal | ICD-10-CM | POA: Diagnosis present

## 2015-07-07 DIAGNOSIS — Z800 Family history of malignant neoplasm of digestive organs: Secondary | ICD-10-CM

## 2015-07-07 DIAGNOSIS — Z87891 Personal history of nicotine dependence: Secondary | ICD-10-CM | POA: Diagnosis not present

## 2015-07-07 DIAGNOSIS — E785 Hyperlipidemia, unspecified: Secondary | ICD-10-CM | POA: Diagnosis present

## 2015-07-07 DIAGNOSIS — I471 Supraventricular tachycardia: Secondary | ICD-10-CM | POA: Diagnosis present

## 2015-07-07 DIAGNOSIS — Z96642 Presence of left artificial hip joint: Secondary | ICD-10-CM | POA: Diagnosis present

## 2015-07-07 DIAGNOSIS — R002 Palpitations: Secondary | ICD-10-CM | POA: Diagnosis not present

## 2015-07-07 HISTORY — DX: Unspecified atrial fibrillation: I48.91

## 2015-07-07 HISTORY — DX: Unspecified atrial flutter: I48.92

## 2015-07-07 LAB — CBC
HCT: 43.9 % (ref 36.0–46.0)
Hemoglobin: 14.5 g/dL (ref 12.0–15.0)
MCH: 30.1 pg (ref 26.0–34.0)
MCHC: 33 g/dL (ref 30.0–36.0)
MCV: 91.1 fL (ref 78.0–100.0)
Platelets: 226 10*3/uL (ref 150–400)
RBC: 4.82 MIL/uL (ref 3.87–5.11)
RDW: 13.9 % (ref 11.5–15.5)
WBC: 6.8 10*3/uL (ref 4.0–10.5)

## 2015-07-07 LAB — BASIC METABOLIC PANEL
Anion gap: 9 (ref 5–15)
BUN: 20 mg/dL (ref 6–20)
CO2: 27 mmol/L (ref 22–32)
Calcium: 9.8 mg/dL (ref 8.9–10.3)
Chloride: 105 mmol/L (ref 101–111)
Creatinine, Ser: 1.05 mg/dL — ABNORMAL HIGH (ref 0.44–1.00)
GFR calc Af Amer: 57 mL/min — ABNORMAL LOW (ref >60)
GFR calc non Af Amer: 50 mL/min — ABNORMAL LOW (ref >60)
Glucose, Bld: 98 mg/dL (ref 65–99)
Potassium: 3.7 mmol/L (ref 3.5–5.1)
Sodium: 141 mmol/L (ref 135–145)

## 2015-07-07 LAB — PROTIME-INR
INR: 0.98 (ref 0.00–1.49)
Prothrombin Time: 13.2 seconds (ref 11.6–15.2)

## 2015-07-07 LAB — I-STAT TROPONIN, ED: Troponin i, poc: 0 ng/mL (ref 0.00–0.08)

## 2015-07-07 LAB — APTT: aPTT: 36 seconds (ref 24–37)

## 2015-07-07 LAB — MAGNESIUM
Magnesium: 1.7 mg/dL (ref 1.7–2.4)
Magnesium: 2.3 mg/dL (ref 1.7–2.4)

## 2015-07-07 LAB — TSH: TSH: 5.5 u[IU]/mL — ABNORMAL HIGH (ref 0.350–4.500)

## 2015-07-07 MED ORDER — POTASSIUM CHLORIDE CRYS ER 20 MEQ PO TBCR
40.0000 meq | EXTENDED_RELEASE_TABLET | Freq: Once | ORAL | Status: AC
  Administered 2015-07-07: 40 meq via ORAL
  Filled 2015-07-07: qty 2

## 2015-07-07 MED ORDER — ONDANSETRON HCL 4 MG/2ML IJ SOLN: 4.0000 mg | Freq: Four times a day (QID) | INTRAMUSCULAR | Status: DC | PRN

## 2015-07-07 MED ORDER — MELATONIN 3 MG PO TABS
3.0000 mg | ORAL_TABLET | Freq: Every day | ORAL | Status: DC
  Filled 2015-07-07: qty 1

## 2015-07-07 MED ORDER — FLECAINIDE ACETATE 100 MG PO TABS: 100.0000 mg | ORAL_TABLET | Freq: Two times a day (BID) | ORAL | Status: DC

## 2015-07-07 MED ORDER — MAGNESIUM SULFATE 2 GM/50ML IV SOLN
2.0000 g | Freq: Once | INTRAVENOUS | Status: AC
  Administered 2015-07-07: 2 g via INTRAVENOUS
  Filled 2015-07-07: qty 50

## 2015-07-07 MED ORDER — SODIUM CHLORIDE 0.9 % IV BOLUS (SEPSIS)
1000.0000 mL | Freq: Once | INTRAVENOUS | Status: AC
  Administered 2015-07-07: 1000 mL via INTRAVENOUS

## 2015-07-07 MED ORDER — BISOPROLOL FUMARATE 5 MG PO TABS
5.0000 mg | ORAL_TABLET | Freq: Every day | ORAL | Status: DC
  Administered 2015-07-07: 5 mg via ORAL
  Filled 2015-07-07: qty 1

## 2015-07-07 MED ORDER — APIXABAN 5 MG PO TABS
5.0000 mg | ORAL_TABLET | Freq: Two times a day (BID) | ORAL | Status: DC
  Administered 2015-07-07: 5 mg via ORAL
  Filled 2015-07-07: qty 1

## 2015-07-07 MED ORDER — PANTOPRAZOLE SODIUM 40 MG PO TBEC
40.0000 mg | DELAYED_RELEASE_TABLET | Freq: Every day | ORAL | Status: DC
  Administered 2015-07-07: 40 mg via ORAL
  Filled 2015-07-07: qty 1

## 2015-07-07 MED ORDER — ACETAMINOPHEN 325 MG PO TABS: 650.0000 mg | ORAL_TABLET | ORAL | Status: DC | PRN

## 2015-07-07 MED ORDER — DILTIAZEM HCL 100 MG IV SOLR: 10.0000 mg | Freq: Once | INTRAVENOUS | Status: DC

## 2015-07-07 MED ORDER — NORTRIPTYLINE HCL 25 MG PO CAPS: 50.0000 mg | ORAL_CAPSULE | Freq: Every day | ORAL | Status: DC

## 2015-07-07 MED ORDER — DILTIAZEM HCL 60 MG PO TABS
60.0000 mg | ORAL_TABLET | Freq: Four times a day (QID) | ORAL | Status: DC
  Administered 2015-07-07: 60 mg via ORAL
  Filled 2015-07-07 (×2): qty 1

## 2015-07-07 MED ORDER — DILTIAZEM HCL 25 MG/5ML IV SOLN
20.0000 mg | Freq: Once | INTRAVENOUS | Status: AC
  Administered 2015-07-07: 20 mg via INTRAVENOUS
  Filled 2015-07-07: qty 5

## 2015-07-07 MED ORDER — ADENOSINE 6 MG/2ML IV SOLN
6.0000 mg | Freq: Once | INTRAVENOUS | Status: AC
  Administered 2015-07-07: 6 mg via INTRAVENOUS
  Filled 2015-07-07: qty 2

## 2015-07-07 MED ORDER — DILTIAZEM HCL ER COATED BEADS 120 MG PO CP24: 120.0000 mg | ORAL_CAPSULE | Freq: Every day | ORAL | Status: DC

## 2015-07-07 MED ORDER — EZETIMIBE 10 MG PO TABS
10.0000 mg | ORAL_TABLET | Freq: Every day | ORAL | Status: DC
  Administered 2015-07-07: 10 mg via ORAL
  Filled 2015-07-07: qty 1

## 2015-07-07 NOTE — Progress Notes (Signed)
Called per floor RN at 0530 for Pt in SVT/ Afib just admitted from ED. Cardiologist Dr. Susy Manor at bedside ordering Adenosine at this time. Upon my arrival Pt placed on Zoll pads with crash cart, as well as bedside monitor. EKG completed yielding SVT vs. AFIB rhythm. Per MD adenosine to be given in order to diagnose Pt underlying rhythm. Procedure explained to Patient and family per MD. 6 mg Adenosine given at 0555. Pt tolerated fairly well with long pause and HR slowly rebounding to 120s ST, BP WNL. See flowsheeet for VS Po2 93-94% on RA no SOB, denies chest pain or discomfort. Pt to start PO Cardizem per Cardiologist. Patient left resting quietly in bed, RN advised to monitor Pt closely and notify Provider and RRT of worsening changes.

## 2015-07-07 NOTE — ED Provider Notes (Signed)
CSN: [id number]     Arrival date & time 07/07/15  0105 History  By signing my name below, I, Terrance Branch, attest that this documentation has been prepared under the direction and in the presence of Everlene Balls, MD. Electronically Signed: Randa Evens, ED Scribe. 07/07/2015. 3:00 AM.      Chief Complaint  Patient presents with  . Atrial Fibrillation  . Tachycardia   Patient is a 81 y.o. female presenting with palpitations. The history is provided by the patient. No language interpreter was used.  Palpitations Palpitations quality:  Fast Duration:  2 hours Chronicity:  Recurrent Worsened by:  Nothing Ineffective treatments:  None tried Associated symptoms: no chest pain, no nausea and no vomiting    HPI Comments: Chelsea Hancock is a 81 y.o. female who presents to the Emergency Department complaining of palpitations onset tonight at 12:30 AM. Pt states she is going to A fib. She states that she is tachycardiac. Pt states she has been complaint with her medications. Pt denies nausea, vomiting, CP or other related symptoms. Pt states that previous IV medications have helped relieve of her symptoms.   Past Medical History  Diagnosis Date  . Hyperlipidemia     takes Zetia and Simvastatin nightly  . Palpitation   . Carotid bruit     Right  . Edema   . Arthritis   . Allergy     portabella mushrooms/diarrhea  . Heart murmur   . Osteoporosis   . Fibromyalgia   . Thrush 03/23/2012  . Vaginitis 03/23/2012  . Sigmoid colon ulcer 03/23/2012  . Hyperglycemia 04/27/2012  . History of migraine     last about 63yrs ago  . Foot drop, right   . Joint pain   . Joint swelling   . Chronic back pain   . Skin problem in pregnancy     takes gabapentin 3 times a day and also has an ointment  . GERD (gastroesophageal reflux disease)     takes Zantac and Nexium daily  . Hemorrhoids   . Diverticulosis   . IBS (irritable bowel syndrome)     takes align daily  . Hypocalcemia 03/29/2013  .  Depression with anxiety 03/29/2013    hx of  . Tachycardia 07/03/2013  . Onychomycosis 07/03/2013  . Adenomatous polyp of colon 1991, 2008  . Medicare annual wellness visit, subsequent 08/13/2014  . Dysrhythmia     paroxysmal A fib, irregular heartbeat;takes Bisoprolol nightly  . Migraine 04/27/2012    last 40 years ago  . Falls frequently   . Memory loss 02/11/2015  . Atrial fibrillation and [facility])    Past Surgical History  Procedure Laterality Date  . Abdominal hysterectomy  1981  . Lumbar laminectomy  1960    x 2  . Veins stripped  1970  . Total hip arthroplasty Left 2012  . Carpal tunnel release Left 2012    ulnar nerve release at elbow  . Cervical fusion  2003, 2005, 2013  . Tonsillectomy  1943  . Left elbow surgery  06/03/11    cyst removed  . Colonoscopy    . Posterior cervical fusion/foraminotomy  08/03/2012    Procedure: POSTERIOR CERVICAL FUSION/FORAMINOTOMY LEVEL 5;  Surgeon: [name], MD;  Location: [facility];  Service: Neurosurgery;  Laterality: N/A;  Cervical four to Thoracic two Posterior cervical decompression with Facet and Pedicle screw fixation  . Back surgery  1997    reconstructive lower back surgery  . Wrist surgery Right 1990's  cyst removed  . Eye surgery  2015    b/l cataracts removed  . Total knee arthroplasty Right 11/20/2014    Procedure: RIGHT TOTAL KNEE ARTHROPLASTY;  Surgeon: [name], MD;  Location: [facility];  Service: Orthopedics;  Laterality: Right;   Family History  Problem Relation Age of Onset  . Bipolar disorder Mother   . Stomach cancer Mother   . Mental illness Mother   . Stroke Father   . Colon cancer Father   . Throat cancer Maternal Uncle     larynx  . HIV Son     Aids  . Anesthesia problems Neg Hx   . Stomach cancer Maternal Grandfather   . Stomach cancer Paternal Grandmother   . Anxiety disorder Mother   . Diabetes Neg Hx   . Heart disease Maternal Grandmother    Social History  Substance Use Topics   . Smoking status: Former Smoker -- 0.25 packs/day for 4 years    Types: Cigarettes  . Smokeless tobacco: Never Used     Comment: quit 20+yrs ago  . Alcohol Use: 0.0 oz/week    0 Standard drinks or equivalent per week     Comment: WINE WITH DINNER. 2 GLASSES RED WINE   OB History    No data available     Review of Systems  Cardiovascular: Positive for palpitations. Negative for chest pain.  Gastrointestinal: Negative for nausea and vomiting.  All other systems reviewed and are negative.     Allergies  Cefuroxime axetil; Macrodantin; Alendronate sodium; Codeine; Demerol; Meperidine hcl; Other; and Sulfonamide derivatives  Home Medications   Prior to Admission medications   Medication Sig Start Date End Date Taking? Authorizing Provider  apixaban (ELIQUIS) 5 MG TABS tablet Take 1 tablet (5 mg total) by mouth 2 (two) times daily. 03/01/15   Darlin Coco, MD  Biotin 5000 MCG TABS Take 5,000 mcg by mouth daily.    Historical Provider, MD  bisoprolol (ZEBETA) 5 MG tablet Take 5 mg by mouth daily.    Historical Provider, MD  Calcium Carb-Cholecalciferol 600-200 MG-UNIT TABS Take 1 tablet by mouth daily.    Historical Provider, MD  diltiazem (CARDIZEM CD) 120 MG 24 hr capsule TAKE ONE CAPSULE BY MOUTH EVERY DAY 05/11/15   Erlene Quan, PA-C  eszopiclone (LUNESTA) 2 MG TABS tablet Take 1 tablet (2 mg total) by mouth at bedtime as needed for sleep. Take immediately before bedtime 02/26/15   Mosie Lukes, MD  flecainide (TAMBOCOR) 50 MG tablet Take 1 tablet (50 mg total) by mouth every 12 (twelve) hours. 02/20/15   Almyra Deforest, PA  gabapentin (NEURONTIN) 600 MG tablet TAKE 1 TABLET BY MOUTH THREE TIMES DAILY 06/29/15   Mosie Lukes, MD  MEGARED OMEGA-3 KRILL OIL 500 MG CAPS Take 1 tablet by mouth daily.    Historical Provider, MD  Melatonin 5 MG CAPS Take 5 mg by mouth at bedtime as needed.    Historical Provider, MD  Multiple Vitamins-Minerals (CENTRUM SILVER ULTRA WOMENS) TABS Take 1  tablet by mouth daily.    Historical Provider, MD  NEXIUM 40 MG capsule TAKE 1 CAPSULE BY MOUTH EVERY DAY AT 2PM Patient taking differently: TAKE 1 CAPSULE BY MOUTH EVERY night 02/13/15   Mosie Lukes, MD  nortriptyline (PAMELOR) 25 MG capsule TAKE 2 CAPSULES BY MOUTH EVERY NIGHT AT BEDTIME 05/30/15   Mosie Lukes, MD  ZETIA 10 MG tablet TAKE 1 TABLET BY MOUTH EVERY DAY 04/06/15   Erline Levine  A Charlett Blake, MD   BP 120/75 mmHg  Pulse 126  Resp 18  Ht 5\' 3"  (1.6 m)  Wt 142 lb 12.8 oz (64.774 kg)  BMI 25.30 kg/m2  SpO2 98%   Physical Exam  Constitutional: She is oriented to person, place, and time. She appears well-developed and well-nourished. No distress.  HENT:  Head: Normocephalic and atraumatic.  Nose: Nose normal.  Mouth/Throat: Oropharynx is clear and moist. No oropharyngeal exudate.  Eyes: Conjunctivae and EOM are normal. Pupils are equal, round, and reactive to light. No scleral icterus.  Neck: Normal range of motion. Neck supple. No JVD present. No tracheal deviation present. No thyromegaly present.  Cardiovascular: Normal heart sounds.  An irregularly irregular rhythm present. Tachycardia present.  Exam reveals no gallop and no friction rub.   No murmur heard. Pulmonary/Chest: Effort normal and breath sounds normal. No respiratory distress. She has no wheezes. She exhibits no tenderness.  Abdominal: Soft. Bowel sounds are normal. She exhibits no distension and no mass. There is no tenderness. There is no rebound and no guarding.  Musculoskeletal: Normal range of motion. She exhibits no edema or tenderness.  Lymphadenopathy:    She has no cervical adenopathy.  Neurological: She is alert and oriented to person, place, and time. No cranial nerve deficit. She exhibits normal muscle tone.  Skin: Skin is warm and dry. No rash noted. No erythema. No pallor.  Nursing note and vitals reviewed.   ED Course  Procedures (including critical care time) DIAGNOSTIC STUDIES: Oxygen Saturation is  98% on RA, nromal by my interpretation.    COORDINATION OF CARE: 3:00 AM-Discussed treatment plan with pt at bedside and pt agreed to plan.     Labs Review Labs Reviewed  BASIC METABOLIC PANEL - Abnormal; Notable for the following:    Creatinine, Ser 1.05 (*)    GFR calc non Af Amer 50 (*)    GFR calc Af Amer 57 (*)    All other components within normal limits  CBC  PROTIME-INR  MAGNESIUM  APTT  I-STAT TROPOININ, ED    Imaging Review Dg Chest 2 View  07/07/2015  CLINICAL DATA:  Patient's diagnosed with atrial fibrillation about 6 months ago. Rapid heart beat for the last our. EXAM: CHEST  2 VIEW COMPARISON:  05/08/2015 FINDINGS: Normal heart size and pulmonary vascularity. No focal airspace disease or consolidation in the lungs. Mild interstitial pattern to the lungs likely represent chronic bronchitis. No blunting of costophrenic angles. No pneumothorax. Mediastinal contours appear intact. Mild thoracolumbar scoliosis and degenerative changes. Postoperative changes in the cervical and lumbar spines. IMPRESSION: No active cardiopulmonary disease. Electronically Signed   By: Lucienne Capers M.D.   On: 07/07/2015 01:41   I have personally reviewed and evaluated these images and lab results as part of my medical decision-making.   EKG Interpretation   Date/Time:  Saturday July 07 2015 01:07:36 EDT Ventricular Rate:  133 PR Interval:    QRS Duration: 96 QT Interval:  330 QTC Calculation: 491 R Axis:   2 Text Interpretation:  Atrial fibrillation with rapid ventricular response  Marked ST abnormality, possible inferolateral subendocardial injury  Abnormal ECG tachycardia now present Confirmed by Glynn Octave  [phone]) on 07/07/2015 1:49:38 AM      MDM   Final diagnoses:  None   Patient presents emergency department for evaluation for palpitations. EKG reveals atrial fibrillation. Patient is on liquids currently. She was given 20 mg IV diltiazem, repeat heart  rate is 79, EKG is pending.  Laboratory studies and x-ray do not reveal cause for the patient going into A. fib tonight.  I spoke with cardiology on call, who recs for admission to cardiovert.  Bed request was placed.  Will give mag and potassium replacement.   I, Kenzlei Runions, personally performed the services described in this documentation. All medical record entries made by the scribe were at my direction and in my presence.  I have reviewed the chart and discharge instructions and agree that the record reflects my personal performance and is accurate and complete. Bernadean Saling.  07/07/2015. 2:51 AM.      Everlene Balls, MD 07/07/15 [phone]

## 2015-07-07 NOTE — ED Notes (Signed)
Patient here with complaint of palpitations starting about 45 minutes ago secondary to sneezing. States history of Afib with RVR x6 episodes in 12 months. Reports hospitalizations for the same. Denies other symptoms.

## 2015-07-07 NOTE — Discharge Instructions (Signed)
Atrial Fibrillation Atrial fibrillation is a type of heartbeat that is irregular or fast (rapid). If you have this condition, your heart keeps quivering in a weird (chaotic) way. This condition can make it so your heart cannot pump blood normally. Having this condition gives a person more risk for stroke, heart failure, and other heart problems. There are different types of atrial fibrillation. Talk with your doctor to learn about the type that you have. HOME CARE  Take over-the-counter and prescription medicines only as told by your doctor.  If your doctor prescribed a blood-thinning medicine, take it exactly as told. Taking too much of it can cause bleeding. If you do not take enough of it, you will not have the protection that you need against stroke and other problems.  Do not use any tobacco products. These include cigarettes, chewing tobacco, and e-cigarettes. If you need help quitting, ask your doctor.  If you have apnea (obstructive sleep apnea), manage it as told by your doctor.  Do not drink alcohol.  Do not drink beverages that have caffeine. These include coffee, soda, and tea.  Maintain a healthy weight. Do not use diet pills unless your doctor says they are safe for you. Diet pills may make heart problems worse.  Follow diet instructions as told by your doctor.  Exercise regularly as told by your doctor.  Keep all follow-up visits as told by your doctor. This is important. GET HELP IF:  You notice a change in the speed, rhythm, or strength of your heartbeat.  You are taking a blood-thinning medicine and you notice more bruising.  You get tired more easily when you move or exercise. GET HELP RIGHT AWAY IF:  You have pain in your chest or your belly (abdomen).  You have sweating or weakness.  You feel sick to your stomach (nauseous).  You notice blood in your throw up (vomit), poop (stool), or pee (urine).  You are short of breath.  You suddenly have swollen feet  and ankles.  You feel dizzy.  Your suddenly get weak or numb in your face, arms, or legs, especially if it happens on one side of your body.  You have trouble talking, trouble understanding, or both.  Your face or your eyelid droops on one side. These symptoms may be an emergency. Do not wait to see if the symptoms will go away. Get medical help right away. Call your local emergency services (911 in the [place] Do not drive yourself to the hospital.   This information is not intended to replace advice given to you by your health care provider. Make sure you discuss any questions you have with your health care provider.   Document Released: 06/17/2008 Document Revised: 05/30/2015 Document Reviewed: 01/03/2015 Elsevier Interactive Patient Education 2016 [place].    Cardiac Ablation  Cardiac ablation is a procedure to stop some heart tissue from causing problems. The heart has many electrical connections. Sometimes these connections cause the heart to beat very fast or irregularly. Removing some of the problem areas can improve heart rhythm or make it normal. Ablation is done for people who:   Have Wolff-Parkinson-White syndrome.  Have other fast heart rhythms (tachycardia).  Have taken medicines for an abnormal heart rhythm (arrhythmia) and the medicines had:  No success.  Side effects.  May have a type of heartbeat that could cause death. BEFORE THE PROCEDURE   Follow instructions from your doctor about eating and drinking before the procedure.  Take your medicines as told  by your doctor. Take them at regular times with water unless told differently by your doctor.  If you are taking diabetes medicine, ask your doctor how to take it. Ask if there are any special instructions you should follow. Your doctor may change how much insulin you take the day of the procedure. PROCEDURE   A special type of X-ray will be used. The X-ray helps your doctor see images of your heart  during the procedure.  A small cut (incision) will be made in your neck or groin.  An IV tube will be started before the procedure begins.  You will be given a numbing medicine (anesthetic) or a medicine to help you relax (sedative).  The skin on your neck or groin will be numbed.  A needle will be put into a large vein in your neck or groin.  A thin, flexible tube (catheter) will be put in to reach your heart.  A dye will be put in the tube. The dye will show up on X-rays. It will help your doctor see the area of the heart that needs treatment.  When the heart tissue that is causing problems is found, the tip of the tube will send an electrical current to it. This will stop it from causing problems.  The tube will be taken out.  Pressure will be put on the area where the tube was. This will keep it from bleeding. A bandage will be placed over the area. AFTER THE PROCEDURE  You will be taken to a recovery area. Your blood pressure, heart rate, and breathing will be watched. The area where the tube was will also be watched for bleeding.  You will need to lie still for 4-6 hours. This keeps the area where the tube was from bleeding.   This information is not intended to replace advice given to you by your health care provider. Make sure you discuss any questions you have with your health care provider.   Document Released: 05/11/2013 Document Revised: 09/29/2014 Document Reviewed: 05/11/2013 Elsevier Interactive Patient Education 2016 [place].       Please monitor your blood pressure when taking diltiazem. You can restart oral diltiazem tonight (the short acting diltiazem you received this morning should wear off by 6pm), please monitor your blood pressure. If you have any palpitation feeling, you can take additional dose of diltiazem per day.

## 2015-07-07 NOTE — Discharge Summary (Signed)
Discharge Summary   Patient ID: Chelsea Hancock,  MRN: [id number], DOB/AGE: August 06, 1934 81 y.o.  Admit date: 07/07/2015 Discharge date: 07/07/2015  Primary Care Provider: [name] Primary Cardiologist: Dr. [name]  Discharge Diagnoses Principal Problem:   Atrial fibrillation with rapid ventricular response [facility]) Active Problems:   Dyslipidemia   Allergies Allergies  Allergen Reactions  . Cefuroxime Axetil Anaphylaxis  . Macrodantin Anaphylaxis  . Alendronate Sodium Other (See Comments)    Severe chest pain similar to heart attack   . Codeine Nausea And Vomiting  . Demerol [Meperidine] Other (See Comments)    hallucinations  . Meperidine Hcl Nausea And Vomiting  . Other Other (See Comments)    Hismanal and Maprobamate  portobello mushrooms - diarrhea  . Sulfonamide Derivatives Hives, Itching and Swelling    Hospital Course  Chelsea Hancock is a pleasant 81 year old female with past medical history of SVT (AFL 04/2014, AT/AFL 11/2014, AF 01/2015, AT 04/2015) on eliquis presented to [facility] on 07/07/2015 with a recurrent palpitation. She denies any chest pain, shortness breath, dizziness or syncope. EKG suggests narrow complex tachycardia with ventricular rate 120 to 130s. She was given adenosine 6 mg IV with 12-lead rhythm paper and transcutaneous support at the bedside resulted in complete AV block for almost 7 seconds revealing underlying P-wave was PP 210ms. P waves upright in inferior lead and V1. She does not have known CAD and HF. She was admitted to cardiology service.   The patient was seen in the morning of 07/07/2015, at which time she was still in atrial fibrillation. Electrophysiology service was consulted, patient was seen by Dr. Curt Bears, by which time she has converted to normal sinus rhythm. She feels much better after converting to sinus rhythm. On review by EP service, patient appears to be in atrial flutter. She also has had atrial  fibrillation and atrial tachycardias. EP service as discussed ablation procedure with the patient, she agrees to this would be the best course of action for her. We'll plan for outpatient A. fib and atrial flutter ablation in the next few weeks. During the meantime, we'll increase her flecainide to 100 mg twice a day. She is deemed stable for discharge from cardiology perspective. I will arrange follow-up with Dr. [name] in the Dr. Macky Lower office.  Of note, during this admission, she does have an elevated TSH of 5.5.    Discharge Vitals Blood pressure 86/60, pulse 125, temperature 98 F (36.7 C), temperature source Oral, resp. rate 19, height 5\' 3"  (1.6 m), weight 143 lb 9.6 oz (65.137 kg), SpO2 92 %.  Filed Weights   07/07/15 0115 07/07/15 0428  Weight: 142 lb 12.8 oz (64.774 kg) 143 lb 9.6 oz (65.137 kg)    Labs  CBC  Recent Labs  07/07/15 0124  WBC 6.8  HGB 14.5  HCT 43.9  MCV 91.1  PLT 585   Basic Metabolic Panel  Recent Labs  07/07/15 0124 07/07/15 0224 07/07/15 0714  NA 141  --   --   K 3.7  --   --   CL 105  --   --   CO2 27  --   --   GLUCOSE 98  --   --   BUN 20  --   --   CREATININE 1.05*  --   --   CALCIUM 9.8  --   --   MG  --  1.7 2.3   Thyroid Function Tests  Recent Labs  07/07/15 0719  TSH 5.500*  Disposition  Pt is being discharged home today in good condition.  Follow-up Plans & Appointments      Follow-up Information    Follow up with Will Meredith Leeds, MD.   Specialty:  Cardiology   Why:  Cardiology office will contact you to arrange followup, please give [place] a call if you do not hear from [place] in 2 business days, Dr. Curt Bears plan for ablation procedure in several weeks.   Contact information:   [address] [place] [address] [phone]       Discharge Medications    Medication List    TAKE these medications        apixaban 5 MG Tabs tablet  Commonly known as:  ELIQUIS  Take 1 tablet (5 mg total)  by mouth 2 (two) times daily.     Biotin 5000 MCG Tabs  Take 5,000 mcg by mouth daily.     bisoprolol 5 MG tablet  Commonly known as:  ZEBETA  Take 5 mg by mouth daily.     Calcium Carb-Cholecalciferol 600-200 MG-UNIT Tabs  Take 1 tablet by mouth daily.     CENTRUM SILVER ULTRA WOMENS Tabs  Take 1 tablet by mouth daily.     diltiazem 120 MG 24 hr capsule  Commonly known as:  CARDIZEM CD  Take 1 capsule (120 mg total) by mouth daily. May take additional dose daily if has palpitation     eszopiclone 2 MG Tabs tablet  Commonly known as:  LUNESTA  Take 1 tablet (2 mg total) by mouth at bedtime as needed for sleep. Take immediately before bedtime     flecainide 100 MG tablet  Commonly known as:  TAMBOCOR  Take 1 tablet (100 mg total) by mouth every 12 (twelve) hours.     gabapentin 600 MG tablet  Commonly known as:  NEURONTIN  TAKE 1 TABLET BY MOUTH THREE TIMES DAILY     MEGARED OMEGA-3 KRILL OIL 500 MG Caps  Take 1 tablet by mouth daily.     Melatonin 5 MG Caps  Take 5 mg by mouth at bedtime.     NEXIUM 40 MG capsule  Generic drug:  esomeprazole  TAKE 1 CAPSULE BY MOUTH EVERY DAY AT 2PM     nortriptyline 25 MG capsule  Commonly known as:  PAMELOR  TAKE 2 CAPSULES BY MOUTH EVERY NIGHT AT BEDTIME     ZETIA 10 MG tablet  Generic drug:  ezetimibe  TAKE 1 TABLET BY MOUTH EVERY DAY         Duration of Discharge Encounter   Greater than 30 minutes including physician time.  Hilbert Corrigan PA-C Pager: [phone] 07/07/2015, 2:23 PM

## 2015-07-07 NOTE — Consult Note (Signed)
Primary Care Physician: [name], MD  Admit Date: 07/07/2015  Reason for consultation: SVT  Chelsea Hancock is a 81 y.o. female with a h/o supraventricular tachyarrhythmias (AFL 04/2014, AT/AFL 11/2014, AF 01/2015, AT 04/2015), on Eliquis, DIltiazem, Bisoprolol, Flecainide, presented with another episode of palpitations. Symptoms started overnight. No CP, SOB, syncope, dizziness, N/V, diaphoresis, orthopnea, PND. She lives alone so she called her daughter who brought her to the ER. Initial trop negative. Low normal K and Mag.   Her ECGS shows narrow complex tachycardia with V rate 120-133 bpm. P waves difficult to tease out but appear to suggest AFL or AT with slightly variable RR interval/Av conduction.   Adenosine bolus 6 mg IVx1 with 12 lead rhythm paper and stand by transcutaneous support at the bedside today resulted in AV block for almost 7 seconds revealing underlying P waves with PP 200 ms (A rate ~ 300 bpm). P waves upright in inferior leads and V1. (placed in her chart)  She does not have known CAD, MI, HF. She had non diagnostic ETT on 02/26/2015 (exercised for less than 3 minutes; peak HR 74 bpm). Her echo in 04/2014 reported normal LVEF, normal LA size.   Today, she denies symptoms of palpitations, chest pain, shortness of breath, orthopnea, PND, lower extremity edema, dizziness, presyncope, syncope, or neurologic sequela. She converted to sinus rhythm and how has a HR of 54.  She is feeling much better now that she is in SR.  Past Medical History  Diagnosis Date  . Hyperlipidemia     takes Zetia and Simvastatin nightly  . Palpitation   . Carotid bruit     Right  . Edema   . Arthritis   . Allergy     portabella mushrooms/diarrhea  . Heart murmur   . Osteoporosis   . Fibromyalgia   . Thrush 03/23/2012  . Vaginitis 03/23/2012  . Sigmoid colon ulcer 03/23/2012  . Hyperglycemia 04/27/2012  . History of migraine     last about 25yrs ago  . Foot drop, right   . Joint  pain   . Joint swelling   . Chronic back pain   . Skin problem in pregnancy     takes gabapentin 3 times a day and also has an ointment  . GERD (gastroesophageal reflux disease)     takes Zantac and Nexium daily  . Hemorrhoids   . Diverticulosis   . IBS (irritable bowel syndrome)     takes align daily  . Hypocalcemia 03/29/2013  . Depression with anxiety 03/29/2013    hx of  . Tachycardia 07/03/2013  . Onychomycosis 07/03/2013  . Adenomatous polyp of colon 1991, 2008  . Medicare annual wellness visit, subsequent 08/13/2014  . Dysrhythmia     paroxysmal A fib, irregular heartbeat;takes Bisoprolol nightly  . Migraine 04/27/2012    last 40 years ago  . Falls frequently   . Memory loss 02/11/2015  . Atrial fibrillation and flutter [facility])    Past Surgical History  Procedure Laterality Date  . Abdominal hysterectomy  1981  . Lumbar laminectomy  1960    x 2  . Veins stripped  1970  . Total hip arthroplasty Left 2012  . Carpal tunnel release Left 2012    ulnar nerve release at elbow  . Cervical fusion  2003, 2005, 2013  . Tonsillectomy  1943  . Left elbow surgery  06/03/11    cyst removed  . Colonoscopy    . Posterior cervical fusion/foraminotomy  08/03/2012  Procedure: POSTERIOR CERVICAL FUSION/FORAMINOTOMY LEVEL 5;  Surgeon: [name], MD;  Location: [facility];  Service: Neurosurgery;  Laterality: N/A;  Cervical four to Thoracic two Posterior cervical decompression with Facet and Pedicle screw fixation  . Back surgery  1997    reconstructive lower back surgery  . Wrist surgery Right 1990's    cyst removed  . Eye surgery  2015    b/l cataracts removed  . Total knee arthroplasty Right 11/20/2014    Procedure: RIGHT TOTAL KNEE ARTHROPLASTY;  Surgeon: [name], MD;  Location: [facility];  Service: Orthopedics;  Laterality: Right;    . apixaban  5 mg Oral BID  . bisoprolol  5 mg Oral Daily  . diltiazem  60 mg Oral 4 times per day  . ezetimibe  10 mg Oral Daily  .  Melatonin  3 mg Oral QHS  . nortriptyline  50 mg Oral QHS  . pantoprazole  40 mg Oral Daily      Allergies  Allergen Reactions  . Cefuroxime Axetil Anaphylaxis  . Macrodantin Anaphylaxis  . Alendronate Sodium Other (See Comments)    Severe chest pain similar to heart attack   . Codeine Nausea And Vomiting  . Demerol [Meperidine] Other (See Comments)    hallucinations  . Meperidine Hcl Nausea And Vomiting  . Other Other (See Comments)    Hismanal and Maprobamate  portobello mushrooms - diarrhea  . Sulfonamide Derivatives Hives, Itching and Swelling    Social History   Social History  . Marital Status: Divorced    Spouse Name: N/A  . Number of Children: N/A  . Years of Education: N/A   Occupational History  . Not on file.   Social History Main Topics  . Smoking status: Former Smoker -- 0.25 packs/day for 4 years    Types: Cigarettes  . Smokeless tobacco: Never Used     Comment: quit 20+yrs ago  . Alcohol Use: 0.0 oz/week    0 Standard drinks or equivalent per week     Comment: [place]. 2 GLASSES RED WINE  . Drug Use: No  . Sexual Activity:    Partners: Male   Other Topics Concern  . Not on file   Social History Narrative    Family History  Problem Relation Age of Onset  . Bipolar disorder Mother   . Stomach cancer Mother   . Mental illness Mother   . Stroke Father   . Colon cancer Father   . Throat cancer Maternal Uncle     larynx  . HIV Son     Aids  . Anesthesia problems Neg Hx   . Stomach cancer Maternal Grandfather   . Stomach cancer Paternal Grandmother   . Anxiety disorder Mother   . Diabetes Neg Hx   . Heart disease Maternal Grandmother     ROS- All systems are reviewed and negative except as per the HPI above  Physical Exam: Telemetry: Filed Vitals:   07/07/15 0540 07/07/15 0555 07/07/15 0610 07/07/15 0923  BP: 124/74 120/76 105/74 108/70  Pulse: 135 130 125   Temp:      TempSrc:      Resp: 21 22 19    Height:       Weight:      SpO2:  94% 92%     GEN- The patient is well appearing, alert and oriented x 3 today.   Head- normocephalic, atraumatic Eyes-  Sclera clear, conjunctiva pink Ears- hearing intact Oropharynx- clear Neck- supple,  no JVP Lymph- no cervical lymphadenopathy Lungs- Clear to ausculation bilaterally, normal work of breathing Heart- tachycardia, no murmurs, rubs or gallops, PMI not laterally displaced GI- soft, NT, ND, + BS Extremities- no clubbing, cyanosis, or edema MS- no significant deformity or atrophy Skin- no rash or lesion Psych- euthymic mood, full affect Neuro- strength and sensation are intact  EKG-  Labs:   Lab Results  Component Value Date   WBC 6.8 07/07/2015   HGB 14.5 07/07/2015   HCT 43.9 07/07/2015   MCV 91.1 07/07/2015   PLT 226 07/07/2015    Recent Labs Lab 07/07/15 0124  NA 141  K 3.7  CL 105  CO2 27  BUN 20  CREATININE 1.05*  CALCIUM 9.8  GLUCOSE 98   Lab Results  Component Value Date   TROPONINI <0.03 02/20/2015    Lab Results  Component Value Date   CHOL 187 08/03/2014   CHOL 179 05/05/2014   CHOL 170 02/10/2014   Lab Results  Component Value Date   HDL 45.80 08/03/2014   HDL 47 05/05/2014   HDL 53 02/10/2014   Lab Results  Component Value Date   LDLCALC 111* 08/03/2014   LDLCALC 85 05/05/2014   LDLCALC 94 02/10/2014   Lab Results  Component Value Date   TRIG 150.0* 08/03/2014   TRIG 235* 05/05/2014   TRIG 114 02/10/2014   Lab Results  Component Value Date   CHOLHDL 4 08/03/2014   CHOLHDL 3.8 05/05/2014   CHOLHDL 3.2 02/10/2014   Lab Results  Component Value Date   LDLDIRECT 93.3 08/04/2008   LDLDIRECT 127.9 07/22/2007      Radiology: No active cardiopulmonary disease.  Echo 05/05/14: - Left ventricle: The cavity size was normal. There was mild concentric hypertrophy. Systolic function was normal. The estimated ejection fraction was in the range of 55% to 60%. Wall motion was normal; there were  no regional wall motion abnormalities. The study is not technically sufficient to allow evaluation of LV diastolic function. - Atrial septum: No defect or patent foramen ovale was identified  ASSESSMENT AND PLAN:  1. SVT: Appears to be atrial flutter.  She also has had atrial fibrillation and atrial tachycardias.  Discussed ablation with the patient and she agrees that this would be the best course of action for her.  Will plan for outpatient AF/flutter ablation in the next few weeks.  Will increase her flecainide to 100 mg BID in the interim.     Will Meredith Leeds, MD 07/07/2015  11:45 AM

## 2015-07-07 NOTE — Progress Notes (Signed)
Cardiology on call paged to make aware that patient has arrived to uniit. Pt resting quietl, no c/o at present.

## 2015-07-07 NOTE — Progress Notes (Signed)
MD Chelsea Hancock came up to unit and talked with patient and family.Adenisone 6mg  IV rapid push was given , followed by NS flush, with MD in room. Pt heart rate down for only few seconds and then back up to 120's.Md place patient on cardizem po. Will continue to monitor patient. [facility] [employer]

## 2015-07-07 NOTE — H&P (Signed)
Primary Cardiologist: Darlin Coco, MD  CC: palpitations  HPI:  31 pleasant [place] woman, with history of supraventricular tachyarrhythmias (AFL 04/2014, AT/AFL 11/2014, AF 01/2015, AT 04/2015), on Eliquis, DIltiazem, Bisoprolol, Flecainide, presented with another episode of palpitations. Symptoms started tonight. No CP, SOB, syncope, dizziness, N/V, diaphoresis, orthopnea, PND. She lives alone so she called her daughter who brought her to the ER. Initial trop negative. Low normal K and Mag.   Her ECGS suggets narrow complex tachycardia with V rate 120-133 bpm. P waves difficult to tease out but appear to suggest AFL or AT with slightly variable RR interval/Av conduction.   Adenosine bolus 6 mg IVx1 with 12 lead rhythm paper and stand by transcutaneous support at the bedside today resulted in complete AV block for almost 7 seconds revealing underlying P waves with PP 200 ms (A rate ~ 300 bpm). P waves upright in inferior leads and V1.  (placed in her chart)  She does not have known CAD, MI, HF. She had non diagnostic ETT on 02/26/2015 (exercised for less than 3 minutes; peak HR 74 bpm). Her echo in 04/2014 reported normal LVEF, normal LA size.    Review of Systems:  10 systems reviewed unremarkable except as noted in HPI   Past Medical History  Diagnosis Date  . Hyperlipidemia     takes Zetia and Simvastatin nightly  . Palpitation   . Carotid bruit     Right  . Edema   . Arthritis   . Allergy     portabella mushrooms/diarrhea  . Heart murmur   . Osteoporosis   . Fibromyalgia   . Thrush 03/23/2012  . Vaginitis 03/23/2012  . Sigmoid colon ulcer 03/23/2012  . Hyperglycemia 04/27/2012  . History of migraine     last about 62yrs ago  . Foot drop, right   . Joint pain   . Joint swelling   . Chronic back pain   . Skin problem in pregnancy     takes gabapentin 3 times a day and also has an ointment  . GERD (gastroesophageal reflux disease)     takes Zantac and Nexium daily  .  Hemorrhoids   . Diverticulosis   . IBS (irritable bowel syndrome)     takes align daily  . Hypocalcemia 03/29/2013  . Depression with anxiety 03/29/2013    hx of  . Tachycardia 07/03/2013  . Onychomycosis 07/03/2013  . Adenomatous polyp of colon 1991, 2008  . Medicare annual wellness visit, subsequent 08/13/2014  . Dysrhythmia     paroxysmal A fib, irregular heartbeat;takes Bisoprolol nightly  . Migraine 04/27/2012    last 40 years ago  . Falls frequently   . Memory loss 02/11/2015  . Atrial fibrillation and flutter (HCC)     No current facility-administered medications on file prior to encounter.   Current Outpatient Prescriptions on File Prior to Encounter  Medication Sig Dispense Refill  . apixaban (ELIQUIS) 5 MG TABS tablet Take 1 tablet (5 mg total) by mouth 2 (two) times daily. 60 tablet 5  . Biotin 5000 MCG TABS Take 5,000 mcg by mouth daily.    . bisoprolol (ZEBETA) 5 MG tablet Take 5 mg by mouth daily.    . Calcium Carb-Cholecalciferol 600-200 MG-UNIT TABS Take 1 tablet by mouth daily.    Marland Kitchen diltiazem (CARDIZEM CD) 120 MG 24 hr capsule TAKE ONE CAPSULE BY MOUTH EVERY DAY 30 capsule 1  . eszopiclone (LUNESTA) 2 MG TABS tablet Take 1 tablet (2 mg total) by  mouth at bedtime as needed for sleep. Take immediately before bedtime 30 tablet 2  . flecainide (TAMBOCOR) 50 MG tablet Take 1 tablet (50 mg total) by mouth every 12 (twelve) hours. 60 tablet 5  . gabapentin (NEURONTIN) 600 MG tablet TAKE 1 TABLET BY MOUTH THREE TIMES DAILY (Patient taking differently: TAKE 1 TABLET BY MOUTH TWICE DAILY) 90 tablet 0  . MEGARED OMEGA-3 KRILL OIL 500 MG CAPS Take 1 tablet by mouth daily.    . Melatonin 5 MG CAPS Take 5 mg by mouth at bedtime.     . Multiple Vitamins-Minerals (CENTRUM SILVER ULTRA WOMENS) TABS Take 1 tablet by mouth daily.    Marland Kitchen NEXIUM 40 MG capsule TAKE 1 CAPSULE BY MOUTH EVERY DAY AT 2PM (Patient taking differently: TAKE 1 CAPSULE BY MOUTH EVERY night) 30 capsule 11  .  nortriptyline (PAMELOR) 25 MG capsule TAKE 2 CAPSULES BY MOUTH EVERY NIGHT AT BEDTIME 180 capsule 0  . ZETIA 10 MG tablet TAKE 1 TABLET BY MOUTH EVERY DAY 30 tablet 6     Allergies  Allergen Reactions  . Cefuroxime Axetil Anaphylaxis  . Macrodantin Anaphylaxis  . Alendronate Sodium Other (See Comments)    Severe chest pain similar to heart attack   . Codeine Nausea And Vomiting  . Demerol [Meperidine] Other (See Comments)    hallucinations  . Meperidine Hcl Nausea And Vomiting  . Other Other (See Comments)    Hismanal and Maprobamate  portobello mushrooms - diarrhea  . Sulfonamide Derivatives Hives, Itching and Swelling    Social History   Social History  . Marital Status: Divorced    Spouse Name: N/A  . Number of Children: N/A  . Years of Education: N/A   Occupational History  . Not on file.   Social History Main Topics  . Smoking status: Former Smoker -- 0.25 packs/day for 4 years    Types: Cigarettes  . Smokeless tobacco: Never Used     Comment: quit 20+yrs ago  . Alcohol Use: 0.0 oz/week    0 Standard drinks or equivalent per week     Comment: [place]. 2 GLASSES RED WINE  . Drug Use: No  . Sexual Activity:    Partners: Male   Other Topics Concern  . Not on file   Social History Narrative    Family History  Problem Relation Age of Onset  . Bipolar disorder Mother   . Stomach cancer Mother   . Mental illness Mother   . Stroke Father   . Colon cancer Father   . Throat cancer Maternal Uncle     larynx  . HIV Son     Aids  . Anesthesia problems Neg Hx   . Stomach cancer Maternal Grandfather   . Stomach cancer Paternal Grandmother   . Anxiety disorder Mother   . Diabetes Neg Hx   . Heart disease Maternal Grandmother     PHYSICAL EXAM: Filed Vitals:   07/07/15 0428  BP: 105/65  Pulse: 127  Temp: 98 F (36.7 C)  Resp: 18   General:  Well appearing. No respiratory difficulty HEENT: normal Neck: supple. no JVD. Carotids 2+ bilat; no  bruits. No lymphadenopathy or thryomegaly appreciated. Cor: PMI nondisplaced. Regular rate & rhythm. No rubs, gallops or murmurs. Lungs: clear Abdomen: soft, nontender, nondistended. No hepatosplenomegaly. No bruits or masses. Good bowel sounds. Extremities: no cyanosis, clubbing, rash, edema Neuro: alert & oriented x 3, cranial nerves grossly intact. moves all 4 extremities w/o difficulty. Affect pleasant.  Results for orders placed or performed during the hospital encounter of 07/07/15 (from the past 24 hour(s))  Basic metabolic panel     Status: Abnormal   Collection Time: 07/07/15  1:24 AM  Result Value Ref Range   Sodium 141 135 - 145 mmol/L   Potassium 3.7 3.5 - 5.1 mmol/L   Chloride 105 101 - 111 mmol/L   CO2 27 22 - 32 mmol/L   Glucose, Bld 98 65 - 99 mg/dL   BUN 20 6 - 20 mg/dL   Creatinine, Ser 1.05 (H) 0.44 - 1.00 mg/dL   Calcium 9.8 8.9 - 10.3 mg/dL   GFR calc non Af Amer 50 (L) >60 mL/min   GFR calc Af Amer 57 (L) >60 mL/min   Anion gap 9 5 - 15  CBC     Status: None   Collection Time: 07/07/15  1:24 AM  Result Value Ref Range   WBC 6.8 4.0 - 10.5 K/uL   RBC 4.82 3.87 - 5.11 MIL/uL   Hemoglobin 14.5 12.0 - 15.0 g/dL   HCT 43.9 36.0 - 46.0 %   MCV 91.1 78.0 - 100.0 fL   MCH 30.1 26.0 - 34.0 pg   MCHC 33.0 30.0 - 36.0 g/dL   RDW 13.9 11.5 - 15.5 %   Platelets 226 150 - 400 K/uL  Protime-INR - (order if Patient is taking Coumadin / Warfarin)     Status: None   Collection Time: 07/07/15  1:24 AM  Result Value Ref Range   Prothrombin Time 13.2 11.6 - 15.2 seconds   INR 0.98 0.00 - 1.49  APTT     Status: None   Collection Time: 07/07/15  1:24 AM  Result Value Ref Range   aPTT 36 24 - 37 seconds  Magnesium     Status: None   Collection Time: 07/07/15  2:24 AM  Result Value Ref Range   Magnesium 1.7 1.7 - 2.4 mg/dL  I-stat troponin, ED     Status: None   Collection Time: 07/07/15  2:29 AM  Result Value Ref Range   Troponin i, poc 0.00 0.00 - 0.08 ng/mL    Comment 3           Dg Chest 2 View  07/07/2015  CLINICAL DATA:  Patient's diagnosed with atrial fibrillation about 6 months ago. Rapid heart beat for the last our. EXAM: CHEST  2 VIEW COMPARISON:  05/08/2015 FINDINGS: Normal heart size and pulmonary vascularity. No focal airspace disease or consolidation in the lungs. Mild interstitial pattern to the lungs likely represent chronic bronchitis. No blunting of costophrenic angles. No pneumothorax. Mediastinal contours appear intact. Mild thoracolumbar scoliosis and degenerative changes. Postoperative changes in the cervical and lumbar spines. IMPRESSION: No active cardiopulmonary disease. Electronically Signed   By: Lucienne Capers M.D.   On: 07/07/2015 01:41     ASSESSMENT:  1. Supraventricular arrhythmia with RVR - A rate and P wave morphology with adenosine challenge suggest atrial flutter vs organized AF; less likely AT given high A rate - She may have tachy-brady syndrome since she has had slow rates in sinus rhythm on previous ECGs - On Eliquis for stroke prevention  - BP normal     PLAN/DISCUSSION:  - Will keep her on tele - Continue Eliquis  - AV nodal blocking agents for rate control - continue Bisoprolol; change long acting Dilt to short acting Dilt 60 mg po q6h and titrate; will consider IV dilt if needed - Would request EP input regarding  EP study +/- RF ablation since Flecainide (albeit low dose) is not particularly effective  Cardiology will follow   Wandra Mannan, MD Cardiology

## 2015-07-09 ENCOUNTER — Telehealth: Payer: Self-pay | Admitting: Cardiology

## 2015-07-09 DIAGNOSIS — I4819 Other persistent atrial fibrillation: Secondary | ICD-10-CM

## 2015-07-09 DIAGNOSIS — Z01812 Encounter for preprocedural laboratory examination: Secondary | ICD-10-CM

## 2015-07-09 NOTE — Telephone Encounter (Addendum)
Left message informing patient's dtr that I would review with Dr. Curt Bears to confirm procedure to schedule. Informed that I would call her back this week. Will forward to Dr. Curt Bears to address.

## 2015-07-09 NOTE — Telephone Encounter (Signed)
New Message     Pt's daughter calling stating that pt was seen in the hospital over the weekend by Dr. Curt Bears and was told she needs to have an ablation. Pt's daughter would like for her to be the one that is called to schedule procedure.

## 2015-07-09 NOTE — Telephone Encounter (Signed)
F/u      Pt's daughter returning Sherri's phone call.

## 2015-07-09 NOTE — Telephone Encounter (Signed)
Per Dr. Curt Bears - schedule AFib/Flutter ablation. Patient's dtr would like to go 11/18. Informed I would arrange and speak with them later this week to review instructions. Cardiac CT sent to [facility] to schedule prior to ablation. Pt's dtr agreeable to above stated plan.

## 2015-07-10 DIAGNOSIS — E7800 Pure hypercholesterolemia, unspecified: Secondary | ICD-10-CM | POA: Diagnosis not present

## 2015-07-10 DIAGNOSIS — Z79899 Other long term (current) drug therapy: Secondary | ICD-10-CM | POA: Diagnosis not present

## 2015-07-11 ENCOUNTER — Encounter: Payer: Self-pay | Admitting: *Deleted

## 2015-07-11 NOTE — Telephone Encounter (Signed)
Called patient and reviewed instructions with patient. Letter of instructions mailed to home address. Pre procedure labs to be completed on 11/14. Pt aware scheduler will call her to arrange post ablation f/u: 4 weeks with Roderic Palau, NP and 3 months with Dr. Curt Bears. Patient verbalized understanding and agreeable to plan.   Left a message with patient's dtr to review procedure instructions with her also.

## 2015-07-13 NOTE — Telephone Encounter (Signed)
Patient's dtr returns my call. Reviewed procedure instructions with her. She verbalized understanding. Informed her that office should be contacting them by next week to arrange cardiac CT.

## 2015-07-18 ENCOUNTER — Encounter: Payer: Self-pay | Admitting: Cardiology

## 2015-07-19 DIAGNOSIS — K8020 Calculus of gallbladder without cholecystitis without obstruction: Secondary | ICD-10-CM | POA: Diagnosis not present

## 2015-07-19 DIAGNOSIS — N1330 Unspecified hydronephrosis: Secondary | ICD-10-CM | POA: Diagnosis not present

## 2015-07-20 DIAGNOSIS — M797 Fibromyalgia: Secondary | ICD-10-CM | POA: Diagnosis not present

## 2015-07-20 DIAGNOSIS — M810 Age-related osteoporosis without current pathological fracture: Secondary | ICD-10-CM | POA: Diagnosis not present

## 2015-07-20 DIAGNOSIS — M179 Osteoarthritis of knee, unspecified: Secondary | ICD-10-CM | POA: Diagnosis not present

## 2015-07-26 NOTE — Telephone Encounter (Signed)
Late entry Pt's daughter called to make appt for her mother. Pt had knee surgery on 11/20/14 and was supposed to be seen in early march. Per daughter, could not make appt and Dr. Anne Fu office has been following INR. Last INR ? 1.3 per daughter, appt made    By Thurnell Garbe, RN

## 2015-08-04 DIAGNOSIS — J029 Acute pharyngitis, unspecified: Secondary | ICD-10-CM | POA: Diagnosis not present

## 2015-08-04 DIAGNOSIS — R062 Wheezing: Secondary | ICD-10-CM | POA: Diagnosis not present

## 2015-08-05 ENCOUNTER — Emergency Department (HOSPITAL_COMMUNITY): Payer: Medicare Other

## 2015-08-05 ENCOUNTER — Emergency Department (HOSPITAL_COMMUNITY)
Admission: EM | Admit: 2015-08-05 | Discharge: 2015-08-05 | Disposition: A | Discharge status: Home / Self Care | Payer: Medicare Other | Attending: Physician Assistant | Admitting: Physician Assistant

## 2015-08-05 ENCOUNTER — Encounter (HOSPITAL_COMMUNITY): Payer: Self-pay | Admitting: *Deleted

## 2015-08-05 DIAGNOSIS — Z86010 Personal history of colonic polyps: Secondary | ICD-10-CM | POA: Diagnosis not present

## 2015-08-05 DIAGNOSIS — K219 Gastro-esophageal reflux disease without esophagitis: Secondary | ICD-10-CM | POA: Diagnosis not present

## 2015-08-05 DIAGNOSIS — I4891 Unspecified atrial fibrillation: Secondary | ICD-10-CM

## 2015-08-05 DIAGNOSIS — Z8742 Personal history of other diseases of the female genital tract: Secondary | ICD-10-CM | POA: Diagnosis not present

## 2015-08-05 DIAGNOSIS — Z87891 Personal history of nicotine dependence: Secondary | ICD-10-CM | POA: Insufficient documentation

## 2015-08-05 DIAGNOSIS — Z8619 Personal history of other infectious and parasitic diseases: Secondary | ICD-10-CM | POA: Diagnosis not present

## 2015-08-05 DIAGNOSIS — G8929 Other chronic pain: Secondary | ICD-10-CM | POA: Diagnosis not present

## 2015-08-05 DIAGNOSIS — Z8659 Personal history of other mental and behavioral disorders: Secondary | ICD-10-CM | POA: Diagnosis not present

## 2015-08-05 DIAGNOSIS — R011 Cardiac murmur, unspecified: Secondary | ICD-10-CM | POA: Diagnosis not present

## 2015-08-05 DIAGNOSIS — K589 Irritable bowel syndrome without diarrhea: Secondary | ICD-10-CM | POA: Diagnosis not present

## 2015-08-05 DIAGNOSIS — Z7901 Long term (current) use of anticoagulants: Secondary | ICD-10-CM | POA: Diagnosis not present

## 2015-08-05 DIAGNOSIS — G43909 Migraine, unspecified, not intractable, without status migrainosus: Secondary | ICD-10-CM | POA: Insufficient documentation

## 2015-08-05 DIAGNOSIS — R002 Palpitations: Secondary | ICD-10-CM | POA: Diagnosis present

## 2015-08-05 DIAGNOSIS — E785 Hyperlipidemia, unspecified: Secondary | ICD-10-CM | POA: Insufficient documentation

## 2015-08-05 DIAGNOSIS — R05 Cough: Secondary | ICD-10-CM | POA: Diagnosis not present

## 2015-08-05 DIAGNOSIS — Z8739 Personal history of other diseases of the musculoskeletal system and connective tissue: Secondary | ICD-10-CM | POA: Insufficient documentation

## 2015-08-05 DIAGNOSIS — Z872 Personal history of diseases of the skin and subcutaneous tissue: Secondary | ICD-10-CM | POA: Insufficient documentation

## 2015-08-05 LAB — URINE MICROSCOPIC-ADD ON

## 2015-08-05 LAB — I-STAT TROPONIN, ED: Troponin i, poc: 0.02 ng/mL (ref 0.00–0.08)

## 2015-08-05 LAB — BASIC METABOLIC PANEL
Anion gap: 8 (ref 5–15)
BUN: 17 mg/dL (ref 6–20)
CO2: 23 mmol/L (ref 22–32)
Calcium: 9 mg/dL (ref 8.9–10.3)
Chloride: 106 mmol/L (ref 101–111)
Creatinine, Ser: 0.8 mg/dL (ref 0.44–1.00)
GFR calc Af Amer: >60 mL/min (ref >60)
GFR calc non Af Amer: >60 mL/min (ref >60)
Glucose, Bld: 140 mg/dL — ABNORMAL HIGH (ref 65–99)
Potassium: 4.9 mmol/L (ref 3.5–5.1)
Sodium: 137 mmol/L (ref 135–145)

## 2015-08-05 LAB — CBC
HCT: 42.5 % (ref 36.0–46.0)
Hemoglobin: 14.2 g/dL (ref 12.0–15.0)
MCH: 30.3 pg (ref 26.0–34.0)
MCHC: 33.4 g/dL (ref 30.0–36.0)
MCV: 90.6 fL (ref 78.0–100.0)
Platelets: 219 10*3/uL (ref 150–400)
RBC: 4.69 MIL/uL (ref 3.87–5.11)
RDW: 13.5 % (ref 11.5–15.5)
WBC: 6.5 10*3/uL (ref 4.0–10.5)

## 2015-08-05 LAB — URINALYSIS, ROUTINE W REFLEX MICROSCOPIC
Bilirubin Urine: NEGATIVE
Glucose, UA: NEGATIVE mg/dL
Ketones, ur: NEGATIVE mg/dL
Leukocytes, UA: NEGATIVE
Nitrite: NEGATIVE
Protein, ur: NEGATIVE mg/dL
Specific Gravity, Urine: 1.022 (ref 1.005–1.030)
Urobilinogen, UA: 0.2 mg/dL (ref 0.0–1.0)
pH: 5.5 (ref 5.0–8.0)

## 2015-08-05 LAB — TSH: TSH: 4.003 u[IU]/mL (ref 0.350–4.500)

## 2015-08-05 MED ORDER — SODIUM CHLORIDE 0.9 % IV BOLUS (SEPSIS)
500.0000 mL | Freq: Once | INTRAVENOUS | Status: AC
  Administered 2015-08-05: 500 mL via INTRAVENOUS

## 2015-08-05 MED ORDER — DILTIAZEM HCL 25 MG/5ML IV SOLN
5.0000 mg | Freq: Once | INTRAVENOUS | Status: AC
  Administered 2015-08-05: 5 mg via INTRAVENOUS
  Filled 2015-08-05: qty 5

## 2015-08-05 NOTE — ED Notes (Signed)
Patient transported to X-ray 

## 2015-08-05 NOTE — Consult Note (Signed)
ELECTROPHYSIOLOGY CONSULT NOTE  Patient ID: Chelsea Hancock, MRN: [id number], DOB/AGE: Nov 21, 1933 81 y.o. Admit date: 08/05/2015 Date of Consult: 08/05/2015  Primary Physician: [name], MD Primary Cardiologist: TB  Chief Complaint: atrial fibrillation   HPI Chelsea Hancock is a 81 y.o. female   seen in the emergency room because of recurrent atrial tachyarrhythmias. She carries a diagnosis of atrial flutter/atrial tachycardia and possibly atrial fibrillation (see below) presents to the emergency room today with recurrent tachypalpitations. These are primarily bothersome because of palpitations. She denies chest pain syncope shortness of breath or dizziness.    cardiac evaluation in the past has included echocardiogram 8/15 demonstrated normal LV function normal LA size.   she has a history of post termination bradycardia; treadmill testing 6/16 was notable for chronotropic incompetence with a peak heart rate of 74.  Following conversion today she feels a little bit sleepy and has nausea.  She was seen in [facility] 10/16 by Dr. Carlyn Reichert. At that time he increased flecainide 50--100 and arranged for ablation scheduled this week.    Past Medical History  Diagnosis Date  . Hyperlipidemia     takes Zetia and Simvastatin nightly  . Palpitation   . Carotid bruit     Right  . Edema   . Arthritis   . Allergy     portabella mushrooms/diarrhea  . Heart murmur   . Osteoporosis   . Fibromyalgia   . Thrush 03/23/2012  . Vaginitis 03/23/2012  . Sigmoid colon ulcer 03/23/2012  . Hyperglycemia 04/27/2012  . History of migraine     last about 71yrs ago  . Foot drop, right   . Joint pain   . Joint swelling   . Chronic back pain   . Skin problem in pregnancy     takes gabapentin 3 times a day and also has an ointment  . GERD (gastroesophageal reflux disease)     takes Zantac and Nexium daily  . Hemorrhoids   . Diverticulosis   . IBS (irritable bowel syndrome)    takes align daily  . Hypocalcemia 03/29/2013  . Depression with anxiety 03/29/2013    hx of  . Tachycardia 07/03/2013  . Onychomycosis 07/03/2013  . Adenomatous polyp of colon 1991, 2008  . Medicare annual wellness visit, subsequent 08/13/2014  . Dysrhythmia     paroxysmal A fib, irregular heartbeat;takes Bisoprolol nightly  . Migraine 04/27/2012    last 40 years ago  . Falls frequently   . Memory loss 02/11/2015  . Atrial fibrillation and flutter [facility])       Surgical History:  Past Surgical History  Procedure Laterality Date  . Abdominal hysterectomy  1981  . Lumbar laminectomy  1960    x 2  . Veins stripped  1970  . Total hip arthroplasty Left 2012  . Carpal tunnel release Left 2012    ulnar nerve release at elbow  . Cervical fusion  2003, 2005, 2013  . Tonsillectomy  1943  . Left elbow surgery  06/03/11    cyst removed  . Colonoscopy    . Posterior cervical fusion/foraminotomy  08/03/2012    Procedure: POSTERIOR CERVICAL FUSION/FORAMINOTOMY LEVEL 5;  Surgeon: [name], MD;  Location: [place] [facility];  Service: Neurosurgery;  Laterality: N/A;  Cervical four to Thoracic two Posterior cervical decompression with Facet and Pedicle screw fixation  . Back surgery  1997    reconstructive lower back surgery  . Wrist surgery Right 1990's    cyst removed  .  Eye surgery  2015    b/l cataracts removed  . Total knee arthroplasty Right 11/20/2014    Procedure: RIGHT TOTAL KNEE ARTHROPLASTY;  Surgeon: [name], MD;  Location: [facility] ORS;  Service: Orthopedics;  Laterality: Right;     Home Meds: Prior to Admission medications   Medication Sig Start Date End Date Taking? Authorizing Provider  apixaban (ELIQUIS) 5 MG TABS tablet Take 1 tablet (5 mg total) by mouth 2 (two) times daily. 03/01/15  Yes Darlin Coco, MD  atorvastatin (LIPITOR) 10 MG tablet Take 10 mg by mouth daily.   Yes Historical Provider, MD  Biotin 5000 MCG TABS Take 5,000 mcg by mouth daily.   Yes Historical Provider,  MD  bisoprolol (ZEBETA) 5 MG tablet Take 5 mg by mouth daily.   Yes Historical Provider, MD  Calcium Carb-Cholecalciferol 600-200 MG-UNIT TABS Take 1 tablet by mouth daily.   Yes Historical Provider, MD  diltiazem (CARDIZEM CD) 120 MG 24 hr capsule Take 1 capsule (120 mg total) by mouth daily. May take additional dose daily if has palpitation 07/07/15  Yes Almyra Deforest, [place]  doxycycline (VIBRAMYCIN) 100 MG capsule Take 100 mg by mouth 2 (two) times daily. For 10 days. Started on 08-04-15   Yes Historical Provider, MD  eszopiclone (LUNESTA) 2 MG TABS tablet Take 1 tablet (2 mg total) by mouth at bedtime as needed for sleep. Take immediately before bedtime 02/26/15  Yes Mosie Lukes, MD  flecainide (TAMBOCOR) 100 MG tablet Take 1 tablet (100 mg total) by mouth every 12 (twelve) hours. 07/07/15  Yes Almyra Deforest, PA  gabapentin (NEURONTIN) 600 MG tablet TAKE 1 TABLET BY MOUTH THREE TIMES DAILY Patient taking differently: TAKE 1 TABLET BY MOUTH TWICE DAILY 06/29/15  Yes Mosie Lukes, MD  MEGARED OMEGA-3 KRILL OIL 500 MG CAPS Take 1 tablet by mouth daily.   Yes Historical Provider, MD  Melatonin 5 MG CAPS Take 5 mg by mouth at bedtime.    Yes Historical Provider, MD  Multiple Vitamins-Minerals (CENTRUM SILVER ULTRA WOMENS) TABS Take 1 tablet by mouth daily.   Yes Historical Provider, MD  NEXIUM 40 MG capsule TAKE 1 CAPSULE BY MOUTH EVERY DAY AT 2PM Patient taking differently: TAKE 1 CAPSULE BY MOUTH EVERY night 02/13/15  Yes Mosie Lukes, MD  nortriptyline (PAMELOR) 25 MG capsule TAKE 2 CAPSULES BY MOUTH EVERY NIGHT AT BEDTIME 05/30/15  Yes Mosie Lukes, MD  ZETIA 10 MG tablet TAKE 1 TABLET BY MOUTH EVERY DAY 04/06/15  Yes Mosie Lukes, MD     Allergies:  Allergies  Allergen Reactions  . Cefuroxime Axetil Anaphylaxis  . Macrodantin Anaphylaxis  . Alendronate Sodium Other (See Comments)    Severe chest pain similar to heart attack   . Codeine Nausea And Vomiting  . Demerol [Meperidine] Other (See  Comments)    hallucinations  . Meperidine Hcl Nausea And Vomiting  . Other Other (See Comments)    Hismanal and Maprobamate  portobello mushrooms - diarrhea  . Sulfonamide Derivatives Hives, Itching and Swelling    Social History   Social History  . Marital Status: Divorced    Spouse Name: N/A  . Number of Children: N/A  . Years of Education: N/A   Occupational History  . Not on file.   Social History Main Topics  . Smoking status: Former Smoker -- 0.25 packs/day for 4 years    Types: Cigarettes  . Smokeless tobacco: Never Used     Comment: quit 20+yrs  ago  . Alcohol Use: 0.0 oz/week    0 Standard drinks or equivalent per week     Comment: [place]. 2 GLASSES RED WINE  . Drug Use: No  . Sexual Activity:    Partners: Male   Other Topics Concern  . Not on file   Social History Narrative     Family History  Problem Relation Age of Onset  . Bipolar disorder Mother   . Stomach cancer Mother   . Mental illness Mother   . Stroke Father   . Colon cancer Father   . Throat cancer Maternal Uncle     larynx  . HIV Son     Aids  . Anesthesia problems Neg Hx   . Stomach cancer Maternal Grandfather   . Stomach cancer Paternal Grandmother   . Anxiety disorder Mother   . Diabetes Neg Hx   . Heart disease Maternal Grandmother      ROS:  Please see the history of present illness.     All other systems reviewed and negative.    Physical Exam:   Blood pressure 90/60, pulse 52, temperature 98.3 F (36.8 C), temperature source Oral, resp. rate 17, SpO2 96 %. General: Well developed, well nourished female in no acute distress. Head: Normocephalic, atraumatic, sclera non-icteric, no xanthomas, nares are without discharge. EENT: normal Lymph Nodes:  none Back: without scoliosis/kyphosis, no CVA tendersness Neck: Negative for carotid bruits. JVD not elevated. Lungs: Clear bilaterally to auscultation without wheezes, rales, or rhonchi. Breathing is unlabored. Heart:  slow but RRR with S1 S2. 2/6 systolic murmur , rubs, or gallops appreciated. Abdomen: Soft, non-tender, non-distended with normoactive bowel sounds. No hepatomegaly. No rebound/guarding. No obvious abdominal masses. Msk:  Strength and tone appear normal for age. Extremities: No clubbing or cyanosis. No edema.  Distal pedal pulses are 2+ and equal bilaterally. Skin: Warm and Dry Neuro: Alert and oriented X 3. CN III-XII intact Grossly normal sensory and motor function . Psych:  Responds to questions appropriately with a normal affect.      Labs: Cardiac Enzymes No results for input(s): CKTOTAL, CKMB, TROPONINI in the last 72 hours. CBC Lab Results  Component Value Date   WBC 6.5 08/05/2015   HGB 14.2 08/05/2015   HCT 42.5 08/05/2015   MCV 90.6 08/05/2015   PLT 219 08/05/2015   PROTIME: No results for input(s): LABPROT, INR in the last 72 hours. Chemistry  Recent Labs Lab 08/05/15 0934  NA 137  K 4.9  CL 106  CO2 23  BUN 17  CREATININE 0.80  CALCIUM 9.0  GLUCOSE 140*   Lipids Lab Results  Component Value Date   CHOL 187 08/03/2014   HDL 45.80 08/03/2014   LDLCALC 111* 08/03/2014   TRIG 150.0* 08/03/2014   BNP No results found for: PROBNP Thyroid Function Tests:  Recent Labs  08/05/15 0943  TSH 4.003      Miscellaneous No results found for: DDIMER  Radiology/Studies:  Dg Chest 2 View  08/05/2015  CLINICAL DATA:  Patient complaining of cough for the last several days. EXAM: CHEST  2 VIEW COMPARISON:  07/07/2015 FINDINGS: Lungs are mildly hyperexpanded. Mild scarring is noted at the apices and there are prominent bronchovascular markings bilaterally. No lung consolidation to suggest pneumonia. There is no edema. No pleural effusion or pneumothorax. Cardiac silhouette is normal in size and configuration. Normal mediastinal and hilar contours. Bony thorax is demineralized but grossly intact. IMPRESSION: No acute cardiopulmonary disease. Electronically Signed  By: Lajean Manes M.D.   On: 08/05/2015 10:30   Dg Chest 2 View  07/07/2015  CLINICAL DATA:  Patient's diagnosed with atrial fibrillation about 6 months ago. Rapid heart beat for the last our. EXAM: CHEST  2 VIEW COMPARISON:  05/08/2015 FINDINGS: Normal heart size and pulmonary vascularity. No focal airspace disease or consolidation in the lungs. Mild interstitial pattern to the lungs likely represent chronic bronchitis. No blunting of costophrenic angles. No pneumothorax. Mediastinal contours appear intact. Mild thoracolumbar scoliosis and degenerative changes. Postoperative changes in the cervical and lumbar spines. IMPRESSION: No active cardiopulmonary disease. Electronically Signed   By: Lucienne Capers M.D.   On: 07/07/2015 01:41    EKG: SVT prob Aflt 2:1 avb ECG following conversion demonstrates sinus rhythm at 51 Intervals 24/08/40.  Assessment and Plan:   ATRIAL FLUTTER   hypertension  PT has atypical atrial flutter and notes describe afib and EAT althought review of tracing from 5/16 also looks like atrial flutter atypical-with variable block.  Her rhythm has converted to sinus. From an arrhythmia point of view she can go home. She is scheduled for CT scanning for left atrial appendage evaluation and possibly registration. Ablation scheduled for Thursday.  Continue apixoban      Virl Axe

## 2015-08-05 NOTE — ED Notes (Signed)
MD aware of BP, bolus to complete prior to giving medication.  Will monitor.

## 2015-08-05 NOTE — ED Provider Notes (Signed)
CSN: [id number]     Arrival date & time 08/05/15  [id number] History   First MD Initiated Contact with Patient 08/05/15 [phone]     Chief Complaint  Patient presents with  . Atrial Fibrillation     (Consider location/radiation/quality/duration/timing/severity/associated sxs/prior Treatment) HPI   Patient is a very pleasant 81 year old female with history of tachycardia ( SVT versus A. fib versus a flutter) presenting today with palpitations. Patient woke up this morning and was cooking breakfast when she went into palpitations. Patient does have multiple times before. Patient most recent admission was a month ago for similar thing. Patient has an ablation scheduled for next week.   Pt sees Dr. Gillermina Phy   Of note patient has had mild cough in the last couple days went to her PCP. PCP started on doxycycline for sore throat.  Past Medical History  Diagnosis Date  . Hyperlipidemia     takes Zetia and Simvastatin nightly  . Palpitation   . Carotid bruit     Right  . Edema   . Arthritis   . Allergy     portabella mushrooms/diarrhea  . Heart murmur   . Osteoporosis   . Fibromyalgia   . Thrush 03/23/2012  . Vaginitis 03/23/2012  . Sigmoid colon ulcer 03/23/2012  . Hyperglycemia 04/27/2012  . History of migraine     last about 58yrs ago  . Foot drop, right   . Joint pain   . Joint swelling   . Chronic back pain   . Skin problem in pregnancy     takes gabapentin 3 times a day and also has an ointment  . GERD (gastroesophageal reflux disease)     takes Zantac and Nexium daily  . Hemorrhoids   . Diverticulosis   . IBS (irritable bowel syndrome)     takes align daily  . Hypocalcemia 03/29/2013  . Depression with anxiety 03/29/2013    hx of  . Tachycardia 07/03/2013  . Onychomycosis 07/03/2013  . Adenomatous polyp of colon 1991, 2008  . Medicare annual wellness visit, subsequent 08/13/2014  . Dysrhythmia     paroxysmal A fib, irregular heartbeat;takes Bisoprolol nightly  . Migraine 04/27/2012     last 40 years ago  . Falls frequently   . Memory loss 02/11/2015  . Atrial fibrillation and flutter [facility])    Past Surgical History  Procedure Laterality Date  . Abdominal hysterectomy  1981  . Lumbar laminectomy  1960    x 2  . Veins stripped  1970  . Total hip arthroplasty Left 2012  . Carpal tunnel release Left 2012    ulnar nerve release at elbow  . Cervical fusion  2003, 2005, 2013  . Tonsillectomy  1943  . Left elbow surgery  06/03/11    cyst removed  . Colonoscopy    . Posterior cervical fusion/foraminotomy  08/03/2012    Procedure: POSTERIOR CERVICAL FUSION/FORAMINOTOMY LEVEL 5;  Surgeon: [name], MD;  Location: [place] [facility];  Service: Neurosurgery;  Laterality: N/A;  Cervical four to Thoracic two Posterior cervical decompression with Facet and Pedicle screw fixation  . Back surgery  1997    reconstructive lower back surgery  . Wrist surgery Right 1990's    cyst removed  . Eye surgery  2015    b/l cataracts removed  . Total knee arthroplasty Right 11/20/2014    Procedure: RIGHT TOTAL KNEE ARTHROPLASTY;  Surgeon: [name], MD;  Location: [facility];  Service: Orthopedics;  Laterality: Right;   Family History  Problem Relation Age of Onset  . Bipolar disorder Mother   . Stomach cancer Mother   . Mental illness Mother   . Stroke Father   . Colon cancer Father   . Throat cancer Maternal Uncle     larynx  . HIV Son     Aids  . Anesthesia problems Neg Hx   . Stomach cancer Maternal Grandfather   . Stomach cancer Paternal Grandmother   . Anxiety disorder Mother   . Diabetes Neg Hx   . Heart disease Maternal Grandmother    Social History  Substance Use Topics  . Smoking status: Former Smoker -- 0.25 packs/day for 4 years    Types: Cigarettes  . Smokeless tobacco: Never Used     Comment: quit 20+yrs ago  . Alcohol Use: 0.0 oz/week    0 Standard drinks or equivalent per week     Comment: [employer]. 2 GLASSES RED WINE   OB History    No data  available     Review of Systems  Constitutional: Negative for activity change and fatigue.  HENT: Positive for sore throat.   Eyes: Negative for discharge.  Respiratory: Positive for cough. Negative for chest tightness.   Cardiovascular: Positive for palpitations. Negative for chest pain.  Gastrointestinal: Negative for abdominal distention.  Genitourinary: Negative for dysuria and difficulty urinating.  Musculoskeletal: Negative for joint swelling.  Skin: Negative for rash.  Allergic/Immunologic: Negative for immunocompromised state.  Neurological: Negative for seizures and speech difficulty.  Psychiatric/Behavioral: Negative for behavioral problems and agitation.      Allergies  Cefuroxime axetil; Macrodantin; Alendronate sodium; Codeine; Demerol; Meperidine hcl; Other; and Sulfonamide derivatives  Home Medications   Prior to Admission medications   Medication Sig Start Date End Date Taking? Authorizing Provider  apixaban (ELIQUIS) 5 MG TABS tablet Take 1 tablet (5 mg total) by mouth 2 (two) times daily. 03/01/15   Darlin Coco, MD  Biotin 5000 MCG TABS Take 5,000 mcg by mouth daily.    Historical Provider, MD  bisoprolol (ZEBETA) 5 MG tablet Take 5 mg by mouth daily.    Historical Provider, MD  Calcium Carb-Cholecalciferol 600-200 MG-UNIT TABS Take 1 tablet by mouth daily.    Historical Provider, MD  diltiazem (CARDIZEM CD) 120 MG 24 hr capsule Take 1 capsule (120 mg total) by mouth daily. May take additional dose daily if has palpitation 07/07/15   Almyra Deforest, PA  eszopiclone (LUNESTA) 2 MG TABS tablet Take 1 tablet (2 mg total) by mouth at bedtime as needed for sleep. Take immediately before bedtime 02/26/15   Mosie Lukes, MD  flecainide (TAMBOCOR) 100 MG tablet Take 1 tablet (100 mg total) by mouth every 12 (twelve) hours. 07/07/15   Almyra Deforest, PA  gabapentin (NEURONTIN) 600 MG tablet TAKE 1 TABLET BY MOUTH THREE TIMES DAILY Patient taking differently: TAKE 1 TABLET BY MOUTH  TWICE DAILY 06/29/15   Mosie Lukes, MD  MEGARED OMEGA-3 KRILL OIL 500 MG CAPS Take 1 tablet by mouth daily.    Historical Provider, MD  Melatonin 5 MG CAPS Take 5 mg by mouth at bedtime.     Historical Provider, MD  Multiple Vitamins-Minerals (CENTRUM SILVER ULTRA WOMENS) TABS Take 1 tablet by mouth daily.    Historical Provider, MD  NEXIUM 40 MG capsule TAKE 1 CAPSULE BY MOUTH EVERY DAY AT 2PM Patient taking differently: TAKE 1 CAPSULE BY MOUTH EVERY night 02/13/15   Mosie Lukes, MD  nortriptyline (PAMELOR) 25 MG capsule TAKE  2 CAPSULES BY MOUTH EVERY NIGHT AT BEDTIME 05/30/15   Mosie Lukes, MD  ZETIA 10 MG tablet TAKE 1 TABLET BY MOUTH EVERY DAY 04/06/15   Mosie Lukes, MD   BP 101/68 mmHg  Pulse 123  Temp(Src) 98.3 F (36.8 C) (Oral)  Resp 32  SpO2 96% Physical Exam  Constitutional: She is oriented to person, place, and time. She appears well-developed and well-nourished.  HENT:  Head: Normocephalic and atraumatic.  Mouth/Throat: Oropharynx is clear and moist.  Dry mucous membranes.  Eyes: Conjunctivae are normal. Right eye exhibits no discharge.  Neck: Neck supple.  Cardiovascular: Normal heart sounds.   No murmur heard. Tachycardic, irregular.  Pulmonary/Chest: Effort normal and breath sounds normal. No stridor. She has no wheezes. She has no rales.  Abdominal: Soft. She exhibits no distension. There is no tenderness.  Musculoskeletal: Normal range of motion. She exhibits no edema.  Neurological: She is oriented to person, place, and time. No cranial nerve deficit.  Skin: Skin is warm and dry. No rash noted. She is not diaphoretic.  Psychiatric: She has a normal mood and affect. Her behavior is normal.  Nursing note and vitals reviewed.   ED Course  Procedures (including critical care time) Labs Review Labs Reviewed  URINE CULTURE  BASIC METABOLIC PANEL  CBC  URINALYSIS, ROUTINE W REFLEX MICROSCOPIC (NOT AT [facility])  TSH  I-STAT TROPOININ, ED    Imaging  Review No results found. I have personally reviewed and evaluated these images and lab results as part of my medical decision-making.   EKG Interpretation   Date/Time:  Sunday August 05 2015 09:04:59 EST Ventricular Rate:  132 PR Interval:    QRS Duration: 101 QT Interval:  350 QTC Calculation: 519 R Axis:   -45 Text Interpretation:  Atrial flutter with varied AV block, Left anterior  fascicular block Abnormal R-wave progression, early transition LVH with  secondary repolarization abnormality Prolonged QT interval Baseline wander  in lead(s) V5 V6 ED PHYSICIAN INTERPRETATION AVAILABLE IN [facility]  no acute ischemia svt vs afib Reconfirmed by Gerald Leitz ([id number]) on  08/05/2015 9:33:45 AM      EKG Interpretation  Date/Time:  Sunday August 05 2015 12:23:46 EST Ventricular Rate:  51 PR Interval:  245 QRS Duration: 102 QT Interval:  429 QTC Calculation: 395 R Axis:   -22 Text Interpretation:  Sinus rhythm Prolonged PR interval Borderline left axis deviation Abnormal R-wave progression, early transition Consider anterior infarct ED PHYSICIAN INTERPRETATION AVAILABLE IN [facility] Normal sinus rhythm no acute ischemia. Reconfirmed by Gerald Leitz ([id number]) on 08/05/2015 2:54:04 PM        MDM   Final diagnoses:  None    Patient is a 81 year old female with history of tachycardia presenting today back into a abnormal dysrhythmia. Patient recently started taking tetracycline yesterday. Patient had mild sore throat and cough. We will get chest x-ray today, labs, TSH, urine, troponin.  In reading the note from cardiology discharge summary in October 2016 it appears that they gave her adenosine and had a long pause showing an AV block with underlying P wave rhythm rate of 200. Therefore today although her EKG looks fairly regular, I'm concerned that this is also A. fib versus a flutter rather than SVT.  We will consult cardiology to give [place] further input  into what the next step would be in her care here.  11:23 AM  Discussed with cardiology. APP recommended diltiazem. However patient's blood pressure is now 85 systolic. We will  give a liter of fluid before giving any rate controlling agents that may affect her blood pressure.  2:50 PM   I gave 1 full liter of fluid. At that point patient's blood pressure came up to 110/60. Heart rate was still about 120. We gave her 5 of diltiazem slow push. Patient then converted to sinus. This is atypical because he would not expect diltiazem to change the rhythm as much as to slow the rhythm down.   Cardiologist saw patient. They recommend discharge home and follow-up as usual with her primary care and cardiologist. She is a CAT scan scheduled for tomorrow to map the heart for an ablation scheduled.  Patient able to ambulate, taking by mouth, feels in her usual state of health at time of discharge.  CRITICAL CARE Performed by: Gardiner Sleeper Total critical care time: 30 minutes Critical care time was exclusive of separately billable procedures and treating other patients. Critical care was necessary to treat or prevent imminent or life-threatening deterioration. Critical care was time spent personally by me on the following activities: development of treatment plan with patient and/or surrogate as well as nursing, discussions with consultants, evaluation of patient's response to treatment, examination of patient, obtaining history from patient or surrogate, ordering and performing treatments and interventions, ordering and review of laboratory studies, ordering and review of radiographic studies, pulse oximetry and re-evaluation of patient's condition.  Kyair Ditommaso Julio Alm, MD 08/05/15 1454

## 2015-08-05 NOTE — ED Notes (Signed)
Pt presents via POV c/o palpitations and being in Afib.  Pt reports having an ablation scheduled for next week and CT chest in the AM.  Pt reports waking up feeling tired, reports this happening 6 times, therefore she automatically checked her HR at home.  Pt a x 4, NAD.  Reports left side chest pain yesterday but denies pain at present.  Denies SOB. HR-120s

## 2015-08-05 NOTE — ED Notes (Signed)
Pt given sandwich and ginger ale.

## 2015-08-05 NOTE — ED Notes (Addendum)
Pt ambulated well from bed to bathroom and back.

## 2015-08-05 NOTE — Discharge Instructions (Signed)
You were seen by cardiology today. You converted to sinus rhythm. Please follow-up with your regular physician as well as her cardiologist as planned. Please return with any increase in symptoms. Cardiac Ablation  Cardiac ablation is a procedure to stop some heart tissue from causing problems. The heart has many electrical connections. Sometimes these connections cause the heart to beat very fast or irregularly. Removing some of the problem areas can improve heart rhythm or make it normal. Ablation is done for people who:   Have Wolff-Parkinson-White syndrome.  Have other fast heart rhythms (tachycardia).  Have taken medicines for an abnormal heart rhythm (arrhythmia) and the medicines had:  No success.  Side effects.  May have a type of heartbeat that could cause death. BEFORE THE PROCEDURE   Follow instructions from your doctor about eating and drinking before the procedure.  Take your medicines as told by your doctor. Take them at regular times with water unless told differently by your doctor.  If you are taking diabetes medicine, ask your doctor how to take it. Ask if there are any special instructions you should follow. Your doctor may change how much insulin you take the day of the procedure. PROCEDURE   A special type of X-ray will be used. The X-ray helps your doctor see images of your heart during the procedure.  A small cut (incision) will be made in your neck or groin.  An IV tube will be started before the procedure begins.  You will be given a numbing medicine (anesthetic) or a medicine to help you relax (sedative).  The skin on your neck or groin will be numbed.  A needle will be put into a large vein in your neck or groin.  A thin, flexible tube (catheter) will be put in to reach your heart.  A dye will be put in the tube. The dye will show up on X-rays. It will help your doctor see the area of the heart that needs treatment.  When the heart tissue that is  causing problems is found, the tip of the tube will send an electrical current to it. This will stop it from causing problems.  The tube will be taken out.  Pressure will be put on the area where the tube was. This will keep it from bleeding. A bandage will be placed over the area. AFTER THE PROCEDURE  You will be taken to a recovery area. Your blood pressure, heart rate, and breathing will be watched. The area where the tube was will also be watched for bleeding.  You will need to lie still for 4-6 hours. This keeps the area where the tube was from bleeding.   This information is not intended to replace advice given to you by your health care provider. Make sure you discuss any questions you have with your health care provider.   Document Released: 05/11/2013 Document Revised: 09/29/2014 Document Reviewed: 05/11/2013 Elsevier Interactive Patient Education [employer].

## 2015-08-06 ENCOUNTER — Other Ambulatory Visit: Payer: Medicare Other

## 2015-08-06 ENCOUNTER — Ambulatory Visit (HOSPITAL_COMMUNITY)
Admission: RE | Admit: 2015-08-06 | Discharge: 2015-08-06 | Disposition: A | Discharge status: Home / Self Care | Payer: Medicare Other | Source: Ambulatory Visit | Attending: Cardiology | Admitting: Cardiology

## 2015-08-06 DIAGNOSIS — I4819 Other persistent atrial fibrillation: Secondary | ICD-10-CM

## 2015-08-06 DIAGNOSIS — I481 Persistent atrial fibrillation: Secondary | ICD-10-CM | POA: Diagnosis not present

## 2015-08-06 DIAGNOSIS — R918 Other nonspecific abnormal finding of lung field: Secondary | ICD-10-CM | POA: Insufficient documentation

## 2015-08-06 DIAGNOSIS — I4891 Unspecified atrial fibrillation: Secondary | ICD-10-CM | POA: Diagnosis not present

## 2015-08-06 LAB — URINE CULTURE: Culture: 5000

## 2015-08-06 MED ORDER — METOPROLOL TARTRATE 1 MG/ML IV SOLN
2.5000 mg | INTRAVENOUS | Status: DC | PRN
  Administered 2015-08-06: 2.5 mg via INTRAVENOUS
  Filled 2015-08-06 (×2): qty 5

## 2015-08-06 MED ORDER — IOHEXOL 350 MG/ML SOLN
80.0000 mL | Freq: Once | INTRAVENOUS | Status: AC | PRN
  Administered 2015-08-06: 100 mL via INTRAVENOUS

## 2015-08-06 MED ORDER — NITROGLYCERIN 0.4 MG SL SUBL
0.4000 mg | SUBLINGUAL_TABLET | SUBLINGUAL | Status: DC | PRN
  Administered 2015-08-06: 0.8 mg via SUBLINGUAL
  Filled 2015-08-06 (×2): qty 25

## 2015-08-06 MED ORDER — METOPROLOL TARTRATE 1 MG/ML IV SOLN
INTRAVENOUS | Status: AC
  Administered 2015-08-06: 2.5 mg via INTRAVENOUS
  Filled 2015-08-06: qty 5

## 2015-08-06 MED ORDER — NITROGLYCERIN 0.4 MG SL SUBL
SUBLINGUAL_TABLET | SUBLINGUAL | Status: AC
  Administered 2015-08-06: 0.8 mg via SUBLINGUAL
  Filled 2015-08-06: qty 2

## 2015-08-09 ENCOUNTER — Encounter (HOSPITAL_COMMUNITY): Payer: Self-pay | Admitting: Certified Registered Nurse Anesthetist

## 2015-08-10 ENCOUNTER — Encounter: Payer: Self-pay | Admitting: Cardiology

## 2015-08-10 ENCOUNTER — Ambulatory Visit (HOSPITAL_COMMUNITY): Payer: Medicare Other | Admitting: Certified Registered Nurse Anesthetist

## 2015-08-10 ENCOUNTER — Encounter (HOSPITAL_COMMUNITY)
Admission: RE | Disposition: A | Discharge status: Home / Self Care | Payer: Self-pay | Source: Ambulatory Visit | Attending: Cardiology

## 2015-08-10 ENCOUNTER — Ambulatory Visit (HOSPITAL_COMMUNITY)
Admission: RE | Admit: 2015-08-10 | Discharge: 2015-08-11 | Disposition: A | Discharge status: Home / Self Care | Payer: Medicare Other | Source: Ambulatory Visit | Attending: Cardiology | Admitting: Cardiology

## 2015-08-10 DIAGNOSIS — I483 Typical atrial flutter: Secondary | ICD-10-CM | POA: Insufficient documentation

## 2015-08-10 DIAGNOSIS — Z87891 Personal history of nicotine dependence: Secondary | ICD-10-CM | POA: Insufficient documentation

## 2015-08-10 DIAGNOSIS — I4892 Unspecified atrial flutter: Secondary | ICD-10-CM | POA: Diagnosis not present

## 2015-08-10 DIAGNOSIS — Z7901 Long term (current) use of anticoagulants: Secondary | ICD-10-CM | POA: Diagnosis not present

## 2015-08-10 DIAGNOSIS — I4891 Unspecified atrial fibrillation: Secondary | ICD-10-CM | POA: Diagnosis present

## 2015-08-10 DIAGNOSIS — K219 Gastro-esophageal reflux disease without esophagitis: Secondary | ICD-10-CM | POA: Insufficient documentation

## 2015-08-10 DIAGNOSIS — Z79899 Other long term (current) drug therapy: Secondary | ICD-10-CM | POA: Diagnosis not present

## 2015-08-10 DIAGNOSIS — M1990 Unspecified osteoarthritis, unspecified site: Secondary | ICD-10-CM | POA: Diagnosis not present

## 2015-08-10 DIAGNOSIS — M797 Fibromyalgia: Secondary | ICD-10-CM | POA: Insufficient documentation

## 2015-08-10 DIAGNOSIS — I480 Paroxysmal atrial fibrillation: Secondary | ICD-10-CM | POA: Diagnosis not present

## 2015-08-10 DIAGNOSIS — E785 Hyperlipidemia, unspecified: Secondary | ICD-10-CM | POA: Insufficient documentation

## 2015-08-10 HISTORY — PX: ELECTROPHYSIOLOGIC STUDY: SHX172A

## 2015-08-10 LAB — POCT ACTIVATED CLOTTING TIME
Activated Clotting Time: 288 seconds
Activated Clotting Time: 319 seconds
Activated Clotting Time: 300 seconds
Activated Clotting Time: 313 seconds
Activated Clotting Time: 153 seconds

## 2015-08-10 LAB — MRSA PCR SCREENING: MRSA by PCR: NEGATIVE

## 2015-08-10 SURGERY — ATRIAL FIBRILLATION ABLATION
Anesthesia: General

## 2015-08-10 MED ORDER — SODIUM CHLORIDE 0.9 % IV SOLN: 250.0000 mL | INTRAVENOUS | Status: DC | PRN

## 2015-08-10 MED ORDER — HEPARIN (PORCINE) IN NACL 2-0.9 UNIT/ML-% IJ SOLN
INTRAMUSCULAR | Status: AC
  Filled 2015-08-10: qty 500

## 2015-08-10 MED ORDER — HYDROMORPHONE HCL 1 MG/ML IJ SOLN: 0.2500 mg | INTRAMUSCULAR | Status: DC | PRN

## 2015-08-10 MED ORDER — ACETAMINOPHEN 325 MG PO TABS
650.0000 mg | ORAL_TABLET | ORAL | Status: DC | PRN
  Administered 2015-08-10 – 2015-08-11 (×2): 650 mg via ORAL
  Filled 2015-08-10 (×2): qty 2

## 2015-08-10 MED ORDER — PROPOFOL 10 MG/ML IV BOLUS
INTRAVENOUS | Status: DC | PRN
  Administered 2015-08-10: 160 mg via INTRAVENOUS

## 2015-08-10 MED ORDER — ISOPROTERENOL HCL 0.2 MG/ML IJ SOLN
INTRAMUSCULAR | Status: AC
  Filled 2015-08-10: qty 5

## 2015-08-10 MED ORDER — ONDANSETRON HCL 4 MG/2ML IJ SOLN: 4.0000 mg | Freq: Four times a day (QID) | INTRAMUSCULAR | Status: DC | PRN

## 2015-08-10 MED ORDER — BUPIVACAINE HCL (PF) 0.25 % IJ SOLN
INTRAMUSCULAR | Status: AC
  Filled 2015-08-10: qty 60

## 2015-08-10 MED ORDER — MELATONIN 5 MG PO CAPS: 5.0000 mg | ORAL_CAPSULE | Freq: Every day | ORAL | Status: DC

## 2015-08-10 MED ORDER — ATORVASTATIN CALCIUM 10 MG PO TABS
10.0000 mg | ORAL_TABLET | Freq: Every day | ORAL | Status: DC
  Administered 2015-08-10: 10 mg via ORAL
  Filled 2015-08-10: qty 1

## 2015-08-10 MED ORDER — HEPARIN (PORCINE) IN NACL 2-0.9 UNIT/ML-% IJ SOLN
INTRAMUSCULAR | Status: DC | PRN
  Administered 2015-08-10: 08:00:00

## 2015-08-10 MED ORDER — PHENYLEPHRINE HCL 10 MG/ML IJ SOLN
INTRAMUSCULAR | Status: DC | PRN
  Administered 2015-08-10: 80 ug via INTRAVENOUS
  Administered 2015-08-10: 120 ug via INTRAVENOUS
  Administered 2015-08-10 (×3): 80 ug via INTRAVENOUS
  Administered 2015-08-10 (×2): 120 ug via INTRAVENOUS
  Administered 2015-08-10: 80 ug via INTRAVENOUS

## 2015-08-10 MED ORDER — PANTOPRAZOLE SODIUM 40 MG PO TBEC
40.0000 mg | DELAYED_RELEASE_TABLET | Freq: Every day | ORAL | Status: DC
  Administered 2015-08-10 – 2015-08-11 (×2): 40 mg via ORAL
  Filled 2015-08-10 (×2): qty 1

## 2015-08-10 MED ORDER — NORTRIPTYLINE HCL 25 MG PO CAPS
50.0000 mg | ORAL_CAPSULE | Freq: Every day | ORAL | Status: DC
  Administered 2015-08-10: 50 mg via ORAL
  Filled 2015-08-10 (×2): qty 2

## 2015-08-10 MED ORDER — ISOPROTERENOL HCL 0.2 MG/ML IJ SOLN
1.0000 mg | INTRAVENOUS | Status: DC | PRN
  Administered 2015-08-10: 2 ug/min via INTRAVENOUS

## 2015-08-10 MED ORDER — FENTANYL CITRATE (PF) 100 MCG/2ML IJ SOLN
INTRAMUSCULAR | Status: DC | PRN
  Administered 2015-08-10: 25 ug via INTRAVENOUS
  Administered 2015-08-10: 50 ug via INTRAVENOUS
  Administered 2015-08-10 (×2): 25 ug via INTRAVENOUS
  Administered 2015-08-10: 50 ug via INTRAVENOUS
  Administered 2015-08-10: 25 ug via INTRAVENOUS

## 2015-08-10 MED ORDER — EPHEDRINE SULFATE 50 MG/ML IJ SOLN
INTRAMUSCULAR | Status: DC | PRN
  Administered 2015-08-10 (×5): 10 mg via INTRAVENOUS

## 2015-08-10 MED ORDER — AMIODARONE HCL 200 MG PO TABS
200.0000 mg | ORAL_TABLET | Freq: Two times a day (BID) | ORAL | Status: DC
Start: 2015-08-10 — End: 2015-08-11
  Administered 2015-08-10 – 2015-08-11 (×3): 200 mg via ORAL
  Filled 2015-08-10 (×3): qty 1

## 2015-08-10 MED ORDER — HEPARIN SODIUM (PORCINE) 1000 UNIT/ML IJ SOLN
INTRAMUSCULAR | Status: AC
  Filled 2015-08-10: qty 1

## 2015-08-10 MED ORDER — BISOPROLOL FUMARATE 5 MG PO TABS
5.0000 mg | ORAL_TABLET | Freq: Every day | ORAL | Status: DC
  Administered 2015-08-11: 5 mg via ORAL
  Filled 2015-08-10 (×2): qty 1

## 2015-08-10 MED ORDER — ALBUMIN HUMAN 5 % IV SOLN
INTRAVENOUS | Status: DC | PRN
  Administered 2015-08-10: 08:00:00 via INTRAVENOUS

## 2015-08-10 MED ORDER — EZETIMIBE 10 MG PO TABS
10.0000 mg | ORAL_TABLET | Freq: Every day | ORAL | Status: DC
  Administered 2015-08-10: 10 mg via ORAL
  Filled 2015-08-10: qty 1

## 2015-08-10 MED ORDER — ALUM & MAG HYDROXIDE-SIMETH 200-200-20 MG/5ML PO SUSP
30.0000 mL | Freq: Four times a day (QID) | ORAL | Status: DC | PRN
  Administered 2015-08-10: 30 mL via ORAL
  Filled 2015-08-10 (×2): qty 30

## 2015-08-10 MED ORDER — PROTAMINE SULFATE 10 MG/ML IV SOLN
INTRAVENOUS | Status: DC | PRN
  Administered 2015-08-10: 30 mg via INTRAVENOUS

## 2015-08-10 MED ORDER — SODIUM CHLORIDE 0.9 % IJ SOLN
3.0000 mL | Freq: Two times a day (BID) | INTRAMUSCULAR | Status: DC
  Administered 2015-08-10 – 2015-08-11 (×2): 3 mL via INTRAVENOUS

## 2015-08-10 MED ORDER — GABAPENTIN 600 MG PO TABS
600.0000 mg | ORAL_TABLET | Freq: Two times a day (BID) | ORAL | Status: DC
  Administered 2015-08-10 – 2015-08-11 (×2): 600 mg via ORAL
  Filled 2015-08-10 (×3): qty 1

## 2015-08-10 MED ORDER — SODIUM CHLORIDE 0.9 % IV SOLN
INTRAVENOUS | Status: DC
Start: 2015-08-10 — End: 2015-08-10
  Administered 2015-08-10 (×3): via INTRAVENOUS

## 2015-08-10 MED ORDER — BUPIVACAINE HCL (PF) 0.25 % IJ SOLN
INTRAMUSCULAR | Status: DC | PRN
  Administered 2015-08-10: 31 mL

## 2015-08-10 MED ORDER — ZOLPIDEM TARTRATE 5 MG PO TABS
5.0000 mg | ORAL_TABLET | Freq: Every evening | ORAL | Status: DC | PRN
  Administered 2015-08-10: 5 mg via ORAL
  Filled 2015-08-10: qty 1

## 2015-08-10 MED ORDER — ONDANSETRON HCL 4 MG/2ML IJ SOLN
INTRAMUSCULAR | Status: DC | PRN
  Administered 2015-08-10: 4 mg via INTRAVENOUS

## 2015-08-10 MED ORDER — PHENYLEPHRINE HCL 10 MG/ML IJ SOLN
10.0000 mg | INTRAVENOUS | Status: DC | PRN
  Administered 2015-08-10: 25 ug/min via INTRAVENOUS

## 2015-08-10 MED ORDER — ONDANSETRON HCL 4 MG/2ML IJ SOLN: 4.0000 mg | Freq: Once | INTRAMUSCULAR | Status: DC | PRN

## 2015-08-10 MED ORDER — DILTIAZEM HCL ER COATED BEADS 120 MG PO CP24
120.0000 mg | ORAL_CAPSULE | Freq: Every day | ORAL | Status: DC
  Administered 2015-08-10 – 2015-08-11 (×2): 120 mg via ORAL
  Filled 2015-08-10 (×2): qty 1

## 2015-08-10 MED ORDER — SODIUM CHLORIDE 0.9 % IJ SOLN: 3.0000 mL | INTRAMUSCULAR | Status: DC | PRN

## 2015-08-10 MED ORDER — GLYCOPYRROLATE 0.2 MG/ML IJ SOLN
INTRAMUSCULAR | Status: DC | PRN
  Administered 2015-08-10 (×2): 0.2 mg via INTRAVENOUS

## 2015-08-10 MED ORDER — APIXABAN 5 MG PO TABS
5.0000 mg | ORAL_TABLET | Freq: Two times a day (BID) | ORAL | Status: DC
  Administered 2015-08-10 – 2015-08-11 (×2): 5 mg via ORAL
  Filled 2015-08-10 (×2): qty 1

## 2015-08-10 MED ORDER — MIDAZOLAM HCL 5 MG/5ML IJ SOLN
INTRAMUSCULAR | Status: DC | PRN
  Administered 2015-08-10 (×2): 1 mg via INTRAVENOUS

## 2015-08-10 MED ORDER — LIDOCAINE HCL (CARDIAC) 20 MG/ML IV SOLN
INTRAVENOUS | Status: DC | PRN
  Administered 2015-08-10: 40 mg via INTRAVENOUS

## 2015-08-10 MED ORDER — HEPARIN SODIUM (PORCINE) 1000 UNIT/ML IJ SOLN
INTRAMUSCULAR | Status: DC | PRN
  Administered 2015-08-10: 3000 [IU] via INTRAVENOUS
  Administered 2015-08-10: 1000 [IU] via INTRAVENOUS
  Administered 2015-08-10: 12000 [IU] via INTRAVENOUS
  Administered 2015-08-10: 2000 [IU] via INTRAVENOUS

## 2015-08-10 SURGICAL SUPPLY — 21 items
BAG SNAP BAND KOVER 36X36 (MISCELLANEOUS) ×2
BLANKET WARM UNDERBOD FULL ACC (MISCELLANEOUS) ×2
CATH DUODECA HALO/ISMUS 7FR (CATHETERS) ×2
CATH NAVISTAR SMARTTOUCH DF (ABLATOR) ×2
CATH SOUNDSTAR ECO REPROCESSED (CATHETERS) ×2
CATH VARIABLE LASSO NAV 2515 (CATHETERS) ×2
CATH WEBSTER BI DIR CS D-F CRV (CATHETERS) ×2
COVER SWIFTLINK CONNECTOR (BAG) ×2
LINE ARTERIAL PRESSURE 36 MF (TUBING) ×4
NEEDLE TRANSSEPTAL BRK 98CM (NEEDLE) ×2
PACK EP LATEX FREE (CUSTOM PROCEDURE TRAY) ×1
PACK EP LF (CUSTOM PROCEDURE TRAY) ×1
PAD DEFIB LIFELINK (PAD) ×2
PATCH CARTO3 (PAD) ×2
SHEATH AGILIS NXT 8.5F 71CM (SHEATH) ×4
SHEATH AVANTI 11F 11CM (SHEATH) ×2
SHEATH PINNACLE 7F 10CM (SHEATH) ×2
SHEATH PINNACLE 8F 10CM (SHEATH) ×4
SHEATH PINNACLE 9F 10CM (SHEATH) ×4
SHIELD RADPAD SCOOP 12X17 (MISCELLANEOUS) ×2
TUBING SMART ABLATE COOLFLOW (TUBING) ×2

## 2015-08-10 NOTE — Progress Notes (Addendum)
Site area: 2 rt fv sheaths Site Prior to Removal:  Level 0 Pressure Applied For: 20 minutes Manual:   yes Patient Status During Pull:  stable Post Pull Site:  Level  0 Post Pull Instructions Given:  yes Post Pull Pulses Present: yes Dressing Applied:  Small tegaderm Bedrest begins @  Comments:

## 2015-08-10 NOTE — Anesthesia Preprocedure Evaluation (Addendum)
Anesthesia Evaluation  Patient identified by MRN, date of birth, ID band Patient awake    Reviewed: Allergy & Precautions, NPO status , Patient's Chart, lab work & pertinent test results  Airway Mallampati: II  TM Distance: >3 FB Neck ROM: Full    Dental  (+) Teeth Intact, Dental Advisory Given   Pulmonary former smoker,    breath sounds clear to auscultation       Cardiovascular  Rhythm:Regular Rate:Normal     Neuro/Psych    GI/Hepatic   Endo/Other    Renal/GU      Musculoskeletal   Abdominal   Peds  Hematology   Anesthesia Other Findings   Reproductive/Obstetrics                            Anesthesia Physical Anesthesia Plan  ASA: III  Anesthesia Plan: General   Post-op Pain Management:    Induction: Intravenous  Airway Management Planned: Oral ETT  Additional Equipment:   Intra-op Plan:   Post-operative Plan: Extubation in OR  Informed Consent: I have reviewed the patients History and Physical, chart, labs and discussed the procedure including the risks, benefits and alternatives for the proposed anesthesia with the patient or authorized representative who has indicated his/her understanding and acceptance.   Dental advisory given  Plan Discussed with: CRNA and Anesthesiologist  Anesthesia Plan Comments:        Anesthesia Quick Evaluation  

## 2015-08-10 NOTE — Progress Notes (Signed)
PHARMACIST - PHYSICIAN ORDER COMMUNICATION  CONCERNING: P&T Medication Policy on Herbal Medications  DESCRIPTION:  This patient's order for:  Melatonin  has been noted.  This product(s) is classified as an "herbal" or natural product. Due to a lack of definitive safety studies or FDA approval, nonstandard manufacturing practices, plus the potential risk of unknown drug-drug interactions while on inpatient medications, the Pharmacy and Therapeutics Committee does not permit the use of "herbal" or natural products of this type within [facility].   ACTION TAKEN: The pharmacy department is unable to verify this order at this time and your patient has been informed of this safety policy. Please reevaluate patient's clinical condition at discharge and address if the herbal or natural product(s) should be resumed at that time.   Alycia Rossetti, PharmD, BCPS Clinical Pharmacist Pager: [phone] 08/10/2015 3:05 PM

## 2015-08-10 NOTE — Transfer of Care (Signed)
Immediate Anesthesia Transfer of Care Note  Patient: Chelsea Hancock  Procedure(s) Performed: Procedure(s): Afib (N/A)  Patient Location: Cath Lab  Anesthesia Type:General  Level of Consciousness: awake, alert  and oriented  Airway & Oxygen Therapy: Patient Spontanous Breathing and Patient connected to face mask oxygen  Post-op Assessment: Report given to RN and Post -op Vital signs reviewed and stable  Post vital signs: Reviewed and stable  Last Vitals:  Filed Vitals:   08/10/15 0554  BP: 130/69  Pulse: 69  Temp: 36.7 C  Resp: 18    Complications: No apparent anesthesia complications

## 2015-08-10 NOTE — H&P (Signed)
Chelsea Hancock is a 81 y.o. female with a h/o supraventricular tachyarrhythmias (AFL 04/2014, AT/AFL 11/2014, AF 01/2015, AT 04/2015), on Eliquis, DIltiazem, Bisoprolol, Flecainide.  She has been admitted multiple times with symptomatic atrial flutter.  She also has had atrial fibrillation in the past.  Today, she denies symptoms of palpitations, chest pain, shortness of breath, orthopnea, PND, lower extremity edema, dizziness, presyncope, syncope, or neurologic sequela. She converted to sinus rhythm and how has a HR of 54. She is feeling much better now that she is in SR.  Past Medical History  Diagnosis Date  . Hyperlipidemia     takes Zetia and Simvastatin nightly  . Palpitation   . Carotid bruit     Right  . Edema   . Arthritis   . Allergy     portabella mushrooms/diarrhea  . Heart murmur   . Osteoporosis   . Fibromyalgia   . Thrush 03/23/2012  . Vaginitis 03/23/2012  . Sigmoid colon ulcer 03/23/2012  . Hyperglycemia 04/27/2012  . History of migraine     last about 52yrs ago  . Foot drop, right   . Joint pain   . Joint swelling   . Chronic back pain   . Skin problem in pregnancy     takes gabapentin 3 times a day and also has an ointment  . GERD (gastroesophageal reflux disease)     takes Zantac and Nexium daily  . Hemorrhoids   . Diverticulosis   . IBS (irritable bowel syndrome)     takes align daily  . Hypocalcemia 03/29/2013  . Depression with anxiety 03/29/2013    hx of  . Tachycardia 07/03/2013  . Onychomycosis 07/03/2013  . Adenomatous polyp of colon 1991, 2008  . Medicare annual wellness visit, subsequent 08/13/2014  . Dysrhythmia     paroxysmal A fib, irregular heartbeat;takes Bisoprolol nightly  . Migraine 04/27/2012    last 40 years ago  . Falls frequently   . Memory loss 02/11/2015  . Atrial fibrillation and flutter [facility])     Past Surgical History  Procedure Laterality Date  . Abdominal hysterectomy  1981  . Lumbar laminectomy  1960    x 2  . Veins stripped  1970  . Total hip arthroplasty Left 2012  . Carpal tunnel release Left 2012    ulnar nerve release at elbow  . Cervical fusion  2003, 2005, 2013  . Tonsillectomy  1943  . Left elbow surgery  06/03/11    cyst removed  . Colonoscopy    . Posterior cervical fusion/foraminotomy  08/03/2012    Procedure: POSTERIOR CERVICAL FUSION/FORAMINOTOMY LEVEL 5; Surgeon: [name], MD; Location: [facility]; Service: Neurosurgery; Laterality: N/A; Cervical four to Thoracic two Posterior cervical decompression with Facet and Pedicle screw fixation  . Back surgery  1997    reconstructive lower back surgery  . Wrist surgery Right 1990's    cyst removed  . Eye surgery  2015    b/l cataracts removed  . Total knee arthroplasty Right 11/20/2014    Procedure: RIGHT TOTAL KNEE ARTHROPLASTY; Surgeon: [name], MD; Location: [facility]; Service: Orthopedics; Laterality: Right;    . apixaban 5 mg Oral BID  . bisoprolol 5 mg Oral Daily  . diltiazem 60 mg Oral 4 times per day  . ezetimibe 10 mg Oral Daily  . Melatonin 3 mg Oral QHS  . nortriptyline 50 mg Oral QHS  . pantoprazole 40 mg Oral Daily      Allergies  Allergen Reactions  .  Cefuroxime Axetil Anaphylaxis  . Macrodantin Anaphylaxis  . Alendronate Sodium Other (See Comments)    Severe chest pain similar to heart attack   . Codeine Nausea And Vomiting  . Demerol [Meperidine] Other (See Comments)    hallucinations  . Meperidine Hcl Nausea And Vomiting  . Other Other (See Comments)    Hismanal and Maprobamate  portobello mushrooms - diarrhea  . Sulfonamide Derivatives Hives, Itching and Swelling    Social History    Social History  . Marital Status: Divorced    Spouse Name: N/A  . Number of Children: N/A  . Years of Education: N/A   Occupational History  . Not on file.   Social History Main Topics  . Smoking status: Former Smoker -- 0.25 packs/day for 4 years    Types: Cigarettes  . Smokeless tobacco: Never Used     Comment: quit 20+yrs ago  . Alcohol Use: 0.0 oz/week    0 Standard drinks or equivalent per week     Comment: [name]. 2 GLASSES RED WINE  . Drug Use: No  . Sexual Activity:    Partners: Male   Other Topics Concern  . Not on file   Social History Narrative    Family History  Problem Relation Age of Onset  . Bipolar disorder Mother   . Stomach cancer Mother   . Mental illness Mother   . Stroke Father   . Colon cancer Father   . Throat cancer Maternal Uncle     larynx  . HIV Son     Aids  . Anesthesia problems Neg Hx   . Stomach cancer Maternal Grandfather   . Stomach cancer Paternal Grandmother   . Anxiety disorder Mother   . Diabetes Neg Hx   . Heart disease Maternal Grandmother     ROS- All systems are reviewed and negative except as per the HPI above  Physical Exam: Telemetry: Filed Vitals:   07/07/15 0540 07/07/15 0555 07/07/15 0610 07/07/15 0923  BP: 124/74 120/76 105/74 108/70  Pulse: 135 130 125   Temp:      TempSrc:      Resp: 21 22 19    Height:      Weight:      SpO2:  94% 92%     GEN- The patient is well appearing, alert and oriented x 3 today.  Head- normocephalic, atraumatic Eyes- Sclera clear, conjunctiva pink Ears- hearing intact Oropharynx- clear Neck- supple, no JVP Lymph- no cervical lymphadenopathy Lungs- Clear to ausculation bilaterally, normal work of breathing Heart- tachycardia, no murmurs, rubs or gallops, PMI not laterally  displaced GI- soft, NT, ND, + BS Extremities- no clubbing, cyanosis, or edema MS- no significant deformity or atrophy Skin- no rash or lesion Psych- euthymic mood, full affect Neuro- strength and sensation are intact  EKG-  Labs:  Lab Results  Component Value Date   WBC 6.8 07/07/2015   HGB 14.5 07/07/2015   HCT 43.9 07/07/2015   MCV 91.1 07/07/2015   PLT 226 07/07/2015    Recent Labs Lab 07/07/15 0124  NA 141  K 3.7  CL 105  CO2 27  BUN 20  CREATININE 1.05*  CALCIUM 9.8  GLUCOSE 98    Recent Labs    Lab Results  Component Value Date   TROPONINI <0.03 02/20/2015       Recent Labs    Lab Results  Component Value Date   CHOL 187 08/03/2014   CHOL 179 05/05/2014   CHOL 170  02/10/2014      Recent Labs    Lab Results  Component Value Date   HDL 45.80 08/03/2014   HDL 47 05/05/2014   HDL 53 02/10/2014      Recent Labs    Lab Results  Component Value Date   LDLCALC 111* 08/03/2014   LDLCALC 85 05/05/2014   LDLCALC 94 02/10/2014      Recent Labs    Lab Results  Component Value Date   TRIG 150.0* 08/03/2014   TRIG 235* 05/05/2014   TRIG 114 02/10/2014      Recent Labs    Lab Results  Component Value Date   CHOLHDL 4 08/03/2014   CHOLHDL 3.8 05/05/2014   CHOLHDL 3.2 02/10/2014      Recent Labs    Lab Results  Component Value Date   LDLDIRECT 93.3 08/04/2008   LDLDIRECT 127.9 07/22/2007       Radiology: No active cardiopulmonary disease.  Echo 05/05/14: - Left ventricle: The cavity size was normal. There was mild concentric hypertrophy. Systolic function was normal. The estimated ejection fraction was in the range of 55% to 60%. Wall motion was normal; there were no regional wall motion abnormalities. The study is not technically sufficient to allow evaluation of LV diastolic  function. - Atrial septum: No defect or patent foramen ovale was identified  ASSESSMENT AND PLAN:  1. Atrial fib/flutter: Plan for ablation today for AF/flutter.  Tauheedah Bok continue her Eliquis post procedure for CHADS2VASC of 2.  Explained risks and benefits of procedure including bleeding, infection, tamponade, heart block, and stroke.  Keishla Oyer use CARTO and ICE during the procedure.  Patient has understood the risks and we Trameka Dorough proceed with ablation.  Edelmira Gallogly Meredith Leeds, MD 08/10/2015 7:13 AM

## 2015-08-10 NOTE — Progress Notes (Addendum)
Site area: 2 lt fv sheaths Site Prior to Removal:  Level  0 Pressure Applied For:  20 minutes Manual:   yes Patient Status During Pull: stable  Post Pull Site:  Level  0 Post Pull Instructions Given:  yes Post Pull Pulses Present: yes Dressing Applied:  Small tegaderm Bedrest begins @  1430 Comments:  IV saline locked. O2 mask removed and placed on 2L [place]

## 2015-08-10 NOTE — Anesthesia Procedure Notes (Signed)
Procedure Name: LMA Insertion Date/Time: 08/10/2015 8:01 AM Performed by: Clearnce Sorrel Pre-anesthesia Checklist: Patient identified, Timeout performed, Emergency Drugs available, Suction available and Patient being monitored Patient Re-evaluated:Patient Re-evaluated prior to inductionOxygen Delivery Method: Circle system utilized Preoxygenation: Pre-oxygenation with 100% oxygen Intubation Type: IV induction LMA: LMA inserted LMA Size: 4.0 Number of attempts: 1 Placement Confirmation: ETT inserted through vocal cords under direct vision,  positive ETCO2 and breath sounds checked- equal and bilateral Tube secured with: Tape Dental Injury: Teeth and Oropharynx as per pre-operative assessment

## 2015-08-10 NOTE — Anesthesia Postprocedure Evaluation (Signed)
  Anesthesia Post-op Note  Patient: Chelsea Hancock  Procedure(s) Performed: Procedure(s): Afib (N/A)  Patient Location: PACU  Anesthesia Type:General  Level of Consciousness: awake and alert   Airway and Oxygen Therapy: Patient Spontanous Breathing  Post-op Pain: none  Post-op Assessment: Post-op Vital signs reviewed              Post-op Vital Signs: Reviewed  Last Vitals:  Filed Vitals:   08/10/15 1355  BP: 104/54  Pulse: 64  Temp:   Resp: 20    Complications: No apparent anesthesia complications

## 2015-08-11 ENCOUNTER — Other Ambulatory Visit: Payer: Self-pay | Admitting: Physician Assistant

## 2015-08-11 DIAGNOSIS — M1990 Unspecified osteoarthritis, unspecified site: Secondary | ICD-10-CM | POA: Diagnosis not present

## 2015-08-11 DIAGNOSIS — I480 Paroxysmal atrial fibrillation: Secondary | ICD-10-CM | POA: Diagnosis not present

## 2015-08-11 DIAGNOSIS — I483 Typical atrial flutter: Secondary | ICD-10-CM | POA: Diagnosis not present

## 2015-08-11 DIAGNOSIS — Z7901 Long term (current) use of anticoagulants: Secondary | ICD-10-CM

## 2015-08-11 DIAGNOSIS — K219 Gastro-esophageal reflux disease without esophagitis: Secondary | ICD-10-CM | POA: Diagnosis not present

## 2015-08-11 DIAGNOSIS — E785 Hyperlipidemia, unspecified: Secondary | ICD-10-CM | POA: Diagnosis not present

## 2015-08-11 DIAGNOSIS — M797 Fibromyalgia: Secondary | ICD-10-CM | POA: Diagnosis not present

## 2015-08-11 MED ORDER — GABAPENTIN 600 MG PO TABS: 600.0000 mg | ORAL_TABLET | Freq: Two times a day (BID) | ORAL | Status: DC

## 2015-08-11 MED ORDER — AMIODARONE HCL 200 MG PO TABS: 200.0000 mg | ORAL_TABLET | Freq: Two times a day (BID) | ORAL | Status: DC

## 2015-08-11 MED ORDER — NEXIUM 40 MG PO CPDR: DELAYED_RELEASE_CAPSULE | ORAL | Status: DC

## 2015-08-11 NOTE — Progress Notes (Signed)
SUBJECTIVE: The patient is doing well today.  At this time, she denies chest pain, shortness of breath, or any new concerns. Feeling well this morning without any complaints and ready to go home.  Marland Kitchen amiodarone  200 mg Oral BID  . apixaban  5 mg Oral BID  . atorvastatin  10 mg Oral q1800  . bisoprolol  5 mg Oral Daily  . diltiazem  120 mg Oral Daily  . ezetimibe  10 mg Oral q1800  . gabapentin  600 mg Oral BID  . nortriptyline  50 mg Oral QHS  . pantoprazole  40 mg Oral Daily  . sodium chloride  3 mL Intravenous Q12H      OBJECTIVE: Physical Exam: Filed Vitals:   08/11/15 0337 08/11/15 0400 08/11/15 0600 08/11/15 0829  BP:  99/47 105/50 94/44  Pulse:    75  Temp: 98.7 F (37.1 C)   98.9 F (37.2 C)  TempSrc: Oral   Oral  Resp:  20 19 25   Height:      Weight:      SpO2:    94%    Intake/Output Summary (Last 24 hours) at 08/11/15 0918 Last data filed at 08/11/15 0831  Gross per 24 hour  Intake   1980 ml  Output   1100 ml  Net    880 ml    Telemetry reveals sinus rhythm  GEN- The patient is well appearing, alert and oriented x 3 today.   Head- normocephalic, atraumatic Eyes-  Sclera clear, conjunctiva pink Ears- hearing intact Oropharynx- clear Neck- supple, no JVP Lymph- no cervical lymphadenopathy Lungs- Clear to ausculation bilaterally, normal work of breathing Heart- Regular rate and rhythm, no murmurs, rubs or gallops, PMI not laterally displaced GI- soft, NT, ND, + BS Extremities- no clubbing, cyanosis, or edema Skin- no rash or lesion Psych- euthymic mood, full affect Neuro- strength and sensation are intact  LABS: Basic Metabolic Panel: No results for input(s): NA, K, CL, CO2, GLUCOSE, BUN, CREATININE, CALCIUM, MG, PHOS in the last 72 hours. Liver Function Tests: No results for input(s): AST, ALT, ALKPHOS, BILITOT, PROT, ALBUMIN in the last 72 hours. No results for input(s): LIPASE, AMYLASE in the last 72 hours. CBC: No results for input(s):  WBC, NEUTROABS, HGB, HCT, MCV, PLT in the last 72 hours. Cardiac Enzymes: No results for input(s): CKTOTAL, CKMB, CKMBINDEX, TROPONINI in the last 72 hours. BNP: Invalid input(s): POCBNP D-Dimer: No results for input(s): DDIMER in the last 72 hours. Hemoglobin A1C: No results for input(s): HGBA1C in the last 72 hours. Fasting Lipid Panel: No results for input(s): CHOL, HDL, LDLCALC, TRIG, CHOLHDL, LDLDIRECT in the last 72 hours. Thyroid Function Tests: No results for input(s): TSH, T4TOTAL, T3FREE, THYROIDAB in the last 72 hours.  Invalid input(s): FREET3 Anemia Panel: No results for input(s): VITAMINB12, FOLATE, FERRITIN, TIBC, IRON, RETICCTPCT in the last 72 hours.  RADIOLOGY: Dg Chest 2 View  08/05/2015  CLINICAL DATA:  Patient complaining of cough for the last several days. EXAM: CHEST  2 VIEW COMPARISON:  07/07/2015 FINDINGS: Lungs are mildly hyperexpanded. Mild scarring is noted at the apices and there are prominent bronchovascular markings bilaterally. No lung consolidation to suggest pneumonia. There is no edema. No pleural effusion or pneumothorax. Cardiac silhouette is normal in size and configuration. Normal mediastinal and hilar contours. Bony thorax is demineralized but grossly intact. IMPRESSION: No acute cardiopulmonary disease. Electronically Signed   By: Lajean Manes M.D.   On: 08/05/2015 10:30   Ct Coronary Morp  W/cta Cor W/score W/ca W/cm &/or Wo/cm  08/06/2015  ADDENDUM REPORT: 08/06/2015 16:23 CLINICAL DATA:  81 year old female schedule for atrial fibrillation ablation. EXAM: Cardiac/Coronary  CT TECHNIQUE: The patient was scanned on a Philips 256 scanner. FINDINGS: A 120 kV prospective scan was triggered in the descending thoracic aorta at 111 HU's. Axial non-contrast 3 mm slices were carried out through the heart. The data set was analyzed on a dedicated work station and scored using the Spencer. Gantry rotation speed was 270 msecs and collimation was .9 mm. 5  mg of iv Metoprolol and 0.4 mg of sl NTG was given. The 3D data set was reconstructed in 5% intervals of the 67-82 % of the R-R cycle. Diastolic phases were analyzed on a dedicated work station using MPR, MIP and VRT modes. The patient received 80 cc of contrast. Pulmonary veins drain normally into the left atrium. There are two on the right and two on the left with no evidence of stenosis and measurements as follows: RUPV: 21 x 20 mm, this is a large vein that further bifurcates into two vessels. RLPV:  17 x 14 mm LUPV:  17 x 11 mm LLPV:  17 x 12 mm There is a large left atrial appendage - broccoli type with ostial measurements 24.2 x 21.5 mm and depth of 31.7 mm. There is no filling defect in the left atrium or left atrial appendage. Esophagus runs in the centerline behind the left atrium and is not in the proximity to any of the pulmonary veins. Aorta: Normal caliber. Minimal calcifications in the aortic root. No dissection. Aortic Valve:  Trileaflet.  No calcifications. Coronary Arteries:  Normal origin.  Right dominance. Left main is a large vessel that gives rise LAD and LCX arteries. There is no plaque. RCA is a large dominant vessel that gives rise to PDA and PLA. There is no plaque. LAD is a large vessel that gives rise to one large diagonal branch and a large septal perforator. There is mild mixed plaque in the proximal and mid LAD associated with 25-5-% stenosis. LX is a medium caliber vessel that gives rise to two large obtuse marginal branches. There is no plaque. Normal size of the pulmonary artery. No ASD or VSD seen. IMPRESSION: 1. Coronary calcium score of 8. This was 67 percentile for age and sex matched control. 2.  Normal coronary origin and right dominance. 3.  Mild non-obstructive plaque in proximal and mid LAD. 4. Pulmonary veins drain normally into the left atrium. There are two on the right and two on the left with no evidence of stenosis. 5. There is a large left atrial appendage - broccoli  type with ostial measurements 24.2 x 21.5 mm and depth of 31.7 mm. There is no filling defect in the left atrium or left atrial appendage. 6. Esophagus runs in the centerline behind the left atrium and is not in the proximity to any of the pulmonary veins. Ena Dawley Electronically Signed   By: Ena Dawley   On: 08/06/2015 16:23  08/06/2015  EXAM: OVER-READ INTERPRETATION  CT CHEST The following report is an over-read performed by radiologist Dr. Julian Hy of [facility] on 08/06/2015. This over-read does not include interpretation of cardiac or coronary anatomy or pathology. The coronary CTA interpretation by the cardiologist is attached. COMPARISON:  None. FINDINGS: 3.6 x 2.1 cm ground-glass/subsolid opacity in the left upper lobe (series 204/ image 29). While the subsolid component favors platelike atelectasis, given the surrounding ground-glass  component, low-grade adenocarcinoma is not excluded. Otherwise, no suspicious pulmonary nodules. Underlying mild centrilobular and paraseptal emphysematous changes. No focal consolidation. No pleural effusion or pneumothorax. Scattered small mediastinal lymph nodes measuring up to 6 mm short axis. Visualized upper abdomen is unremarkable. No focal osseous lesions. IMPRESSION: 3.6 x 2.1 cm ground-glass/subsolid opacity in the left upper lobe. Low-grade adenocarcinoma is not excluded. Follow-up CT chest is suggested in 3 months to assess for persistence. Electronically Signed: By: Julian Hy M.D. On: 08/06/2015 11:33    ASSESSMENT AND PLAN:  Active Problems:   AF (atrial fibrillation) (HCC)  Had atrial fibrillation and atrial flutter ablation performed yesterday. Tolerated the procedure well with no arrhythmia overnight. Plan is for discharge today on amiodarone 200 mg twice daily for a week and daily thereafter. She is to follow-up in atrial fibrillation clinic the week after Thanksgiving. We'll continue her amiodarone until I  see her back in clinic. She should also remain anticoagulated due to her chads 2 VASc score of 2.  Rollins Wrightson Meredith Leeds, MD 08/11/2015 9:18 AM

## 2015-08-11 NOTE — Discharge Instructions (Signed)

## 2015-08-11 NOTE — Discharge Summary (Signed)
CARDIOLOGY DISCHARGE SUMMARY   Patient ID: Chelsea Hancock MRN: [id number] DOB/AGE: 79/31/1935 79 y.o.  Admit date: 08/10/2015 Discharge date: 08/11/2015  PCP: [name], MD Primary Cardiologist: Dr [name] Electrophysiologist: Dr Curt Bears  Primary Discharge Diagnosis:  Atrial fibrillation Secondary Discharge Diagnosis:    Atrial flutter   Chronic anticoagulation - Eliquis, CHADS2VASC=2  PROCEDURES: 1. Comprehensive electrophysiologic study. 2. Coronary sinus pacing and recording. 3. Three-dimensional mapping of atrial fibrillation with additional mapping and ablation of a second discrete focus (atrial flutter) 4. Ablation of atrial fibrillation with additional mapping and ablation of a second discrete focus (atrial flutter) 5. Intracardiac echocardiography. 6. Transseptal puncture of an intact septum. 7.Arrhythmia induction with pacing with isuprel infusion 8. Cardioversion  Hospital Course: Chelsea Hancock is a 81 y.o. female with a history of Atrial fib and atrial flutter. She has required admission multiple times. She has failed antiarrhythmic therapy. Ablation was recommended and she came to the hospital for the procedure on 11/18.  She had the procedures listed above, which were successful, results below. She tolerated them well.  On 11/19, she was maintaining SR and was seen by Dr Curt Bears. All data were reviewed. She was doing well and no further inpatient workup was indicated. She is considered stable for discharge, to follow up as and outpatient.  Labs:   Lab Results  Component Value Date   WBC 6.5 08/05/2015   HGB 14.2 08/05/2015   HCT 42.5 08/05/2015   MCV 90.6 08/05/2015   PLT 219 08/05/2015     Recent Labs Lab 08/05/15 0934  NA 137  K 4.9  CL 106  CO2 23  BUN 17  CREATININE 0.80  CALCIUM 9.0  GLUCOSE 140*   EP Results: 08/10/2015 CONCLUSIONS: 1. Sinus rhythm upon presentation.  2. Successful electrical  isolation and anatomical encircling of all four pulmonary veins with radiofrequency current.  3. Cavo-tricuspid isthmus ablation was performed with complete bidirectional isthmus block achieved.  4. No early apparent complications.  EKG: 08/11/2015 Sinus rhythm with 1st degree A-V block Moderate voltage criteria for LVH, may be normal variant T wave abnormality, lateral leads Vent. rate 71 BPM PR interval 232 ms QRS duration 104 ms QT/QTc 418/454 ms P-R-T axes 74 -20 93   FOLLOW UP PLANS AND APPOINTMENTS Allergies  Allergen Reactions  . Cefuroxime Axetil Anaphylaxis  . Macrodantin Anaphylaxis  . Alendronate Sodium Other (See Comments)    Severe chest pain similar to heart attack   . Codeine Nausea And Vomiting  . Demerol [Meperidine] Other (See Comments)    hallucinations  . Meperidine Hcl Nausea And Vomiting  . Other Other (See Comments)    Hismanal and Maprobamate  portobello mushrooms - diarrhea  . Sulfonamide Derivatives Hives, Itching and Swelling     Medication List    STOP taking these medications        flecainide 100 MG tablet  Commonly known as:  TAMBOCOR      TAKE these medications        amiodarone 200 MG tablet  Commonly known as:  PACERONE  Take 1 tablet (200 mg total) by mouth 2 (two) times daily. Take BID for 1 week, then daily     apixaban 5 MG Tabs tablet  Commonly known as:  ELIQUIS  Take 1 tablet (5 mg total) by mouth 2 (two) times daily.     atorvastatin 10 MG tablet  Commonly known as:  LIPITOR  Take 10 mg by mouth daily.  Biotin 5000 MCG Tabs  Take 5,000 mcg by mouth daily.     bisoprolol 5 MG tablet  Commonly known as:  ZEBETA  Take 5 mg by mouth daily.     Calcium Carb-Cholecalciferol 600-200 MG-UNIT Tabs  Take 1 tablet by mouth daily.     CENTRUM SILVER ULTRA WOMENS Tabs  Take 1 tablet by mouth daily.     diltiazem 120 MG 24 hr capsule  Commonly known as:  CARDIZEM CD  Take 1 capsule (120 mg total) by mouth daily.  May take additional dose daily if has palpitation     doxycycline 100 MG capsule  Commonly known as:  VIBRAMYCIN  Take 100 mg by mouth 2 (two) times daily. For 10 days. Started on 08-04-15     eszopiclone 2 MG Tabs tablet  Commonly known as:  LUNESTA  Take 1 tablet (2 mg total) by mouth at bedtime as needed for sleep. Take immediately before bedtime     gabapentin 600 MG tablet  Commonly known as:  NEURONTIN  Take 1 tablet (600 mg total) by mouth 2 (two) times daily.     MEGARED OMEGA-3 KRILL OIL 500 MG Caps  Take 1 tablet by mouth daily.     Melatonin 5 MG Caps  Take 5 mg by mouth at bedtime.     NEXIUM 40 MG capsule  Generic drug:  esomeprazole  TAKE 1 CAPSULE BY MOUTH EVERY night     nortriptyline 25 MG capsule  Commonly known as:  PAMELOR  TAKE 2 CAPSULES BY MOUTH EVERY NIGHT AT BEDTIME     ZETIA 10 MG tablet  Generic drug:  ezetimibe  TAKE 1 TABLET BY MOUTH EVERY DAY        Discharge Instructions    Diet - low sodium heart healthy    Complete by:  As directed      Increase activity slowly    Complete by:  As directed           Follow-up Information    Follow up with Warren Danes, MD On 08/31/2015.   Specialty:  Cardiology   Why:  1:45 pm   Contact information:   [address] [place] [address] [phone]       Follow up with CARROLL,DONNA, NP On 09/06/2015.   Specialties:  Nurse Practitioner, Cardiology   Why:  11:30 am   Contact information:   [place] [address] [phone]       BRING ALL MEDICATIONS WITH YOU TO FOLLOW UP APPOINTMENTS  Time with patient to include physician time: 41 min Signed: Rosaria Ferries, PA-C 08/11/2015, 9:41 AM Co-Sign MD  I have seen and examined this patient with Rosaria Ferries.  Agree with above, note added to reflect my findings.  On exam, regular rhythm, no murmurs, lungs clear.  Had AF ablation yesterday. D/C with amiodarone and follow up in AF clinic.    Continue  anticoagulation for CHADS2VASC of 2.  Will M. Camnitz MD 08/11/2015 12:06 PM

## 2015-08-13 ENCOUNTER — Encounter (HOSPITAL_COMMUNITY): Payer: Self-pay | Admitting: Cardiology

## 2015-08-13 NOTE — Telephone Encounter (Signed)
Refilled for 90 days as requested

## 2015-08-20 DIAGNOSIS — N1330 Unspecified hydronephrosis: Secondary | ICD-10-CM | POA: Diagnosis not present

## 2015-08-27 NOTE — Progress Notes (Signed)
Cardiology Office Note   Date:  08/28/2015   ID:  VERONE PARI, DOB 17-Feb-1934, MRN [id number]  PCP:  [name], MD  Primary Cardiologist: Dr [name] Electrophysiologist: Dr Curt Bears     History of Present Illness: Chelsea Hancock is a 81 y.o. female with a of HTN, HLD and afib/flutter who presents to clinic for post hospital follow up.   She has a history of supraventricular tachyarrhythmias (AFL 04/2014, AT/AFL 11/2014, AF 01/2015, AT 04/2015), on Eliquis, DIltiazem, Bisoprolol, and Flecainide.ECHO 8/15 demonstrated normal LV function normal LA size. She has a history of post termination bradycardia; treadmill testing 6/16 was notable for chronotropic incompetence with a peak heart rate of 74. She has been admitted multiple times with symptomatic atrial flutter. She also has had atrial fibrillation in the past as well as atrial tach. She was seen in [facility] 07/08/15 by Dr. Curt Bears. At that time he increased flecainide 50-->100mg   and and arranged for ablation scheduled 08/11/15. Seen again in the ED 08/05/15 for afib/flutter but spontaneously converted and sent home.   She presented to [facility] on 08/10/15 for planned ablation. She tolerated the procedure well and was maintaining NSR the following day. Her flecainide was discontinued and she was loaded on amio (200mg  BID for a week) and then continued on amiodarone 200mg  po qd. She was discharged with outpatient follow up.  Today she presents for follow up. Since her ablation she has been feeling quite well. No palpitations. She denies CP, SOB, orthopnea, PND. No syncope or dizziness. She has chronic LE edema that is stable. No blood in her stool or urine.    Past Medical History  Diagnosis Date  . Hyperlipidemia     takes Zetia and Simvastatin nightly  . Palpitation   . Carotid bruit     Right  . Edema   . Arthritis   . Allergy     portabella mushrooms/diarrhea  . Heart murmur   . Osteoporosis   .  Fibromyalgia   . Thrush 03/23/2012  . Vaginitis 03/23/2012  . Sigmoid colon ulcer 03/23/2012  . Hyperglycemia 04/27/2012  . History of migraine     last about 1yrs ago  . Foot drop, right   . Joint pain   . Joint swelling   . Chronic back pain   . Skin problem in pregnancy     takes gabapentin 3 times a day and also has an ointment  . GERD (gastroesophageal reflux disease)     takes Zantac and Nexium daily  . Hemorrhoids   . Diverticulosis   . IBS (irritable bowel syndrome)     takes align daily  . Hypocalcemia 03/29/2013  . Depression with anxiety 03/29/2013    hx of  . Tachycardia 07/03/2013  . Onychomycosis 07/03/2013  . Adenomatous polyp of colon 1991, 2008  . Medicare annual wellness visit, subsequent 08/13/2014  . Dysrhythmia     paroxysmal A fib, irregular heartbeat;takes Bisoprolol nightly  . Migraine 04/27/2012    last 40 years ago  . Falls frequently   . Memory loss 02/11/2015  . Atrial fibrillation and flutter [facility])     Past Surgical History  Procedure Laterality Date  . Abdominal hysterectomy  1981  . Lumbar laminectomy  1960    x 2  . Veins stripped  1970  . Total hip arthroplasty Left 2012  . Carpal tunnel release Left 2012    ulnar nerve release at elbow  . Cervical fusion  2003, 2005, 2013  .  Tonsillectomy  1943  . Left elbow surgery  06/03/11    cyst removed  . Colonoscopy    . Posterior cervical fusion/foraminotomy  08/03/2012    Procedure: POSTERIOR CERVICAL FUSION/FORAMINOTOMY LEVEL 5;  Surgeon: [name], MD;  Location: [place] [facility];  Service: Neurosurgery;  Laterality: N/A;  Cervical four to Thoracic two Posterior cervical decompression with Facet and Pedicle screw fixation  . Back surgery  1997    reconstructive lower back surgery  . Wrist surgery Right 1990's    cyst removed  . Eye surgery  2015    b/l cataracts removed  . Total knee arthroplasty Right 11/20/2014    Procedure: RIGHT TOTAL KNEE ARTHROPLASTY;  Surgeon: [name], MD;  Location:  [facility] ORS;  Service: Orthopedics;  Laterality: Right;  . Electrophysiologic study N/A 08/10/2015    Procedure: Afib;  Surgeon: Will Meredith Leeds, MD;  Location: [place] CV LAB;  Service: Cardiovascular;  Laterality: N/A;     Current Outpatient Prescriptions  Medication Sig Dispense Refill  . amiodarone (PACERONE) 200 MG tablet Take 1 tablet (200 mg total) by mouth 2 (two) times daily. Take BID for 1 week, then daily 40 tablet 11  . apixaban (ELIQUIS) 5 MG TABS tablet Take 1 tablet (5 mg total) by mouth 2 (two) times daily. 60 tablet 5  . atorvastatin (LIPITOR) 10 MG tablet Take 10 mg by mouth daily.    . Biotin 5000 MCG TABS Take 5,000 mcg by mouth daily.    . bisoprolol (ZEBETA) 5 MG tablet Take 5 mg by mouth daily.    . Calcium Carb-Cholecalciferol 600-200 MG-UNIT TABS Take 1 tablet by mouth daily.    Marland Kitchen diltiazem (CARDIZEM CD) 120 MG 24 hr capsule Take 1 capsule (120 mg total) by mouth daily. May take additional dose daily if has palpitation 30 capsule 5  . doxycycline (VIBRAMYCIN) 100 MG capsule Take 100 mg by mouth 2 (two) times daily. For 10 days. Started on 08-04-15    . eszopiclone (LUNESTA) 2 MG TABS tablet Take 1 tablet (2 mg total) by mouth at bedtime as needed for sleep. Take immediately before bedtime 30 tablet 2  . gabapentin (NEURONTIN) 600 MG tablet TAKE 1 TABLET(600 MG) BY MOUTH TWICE DAILY 180 tablet 0  . MEGARED OMEGA-3 KRILL OIL 500 MG CAPS Take 1 tablet by mouth daily.    . Melatonin 5 MG CAPS Take 5 mg by mouth at bedtime.     . Multiple Vitamins-Minerals (CENTRUM SILVER ULTRA WOMENS) TABS Take 1 tablet by mouth daily.    Marland Kitchen NEXIUM 40 MG capsule TAKE 1 CAPSULE BY MOUTH EVERY night 30 capsule 11  . nortriptyline (PAMELOR) 25 MG capsule TAKE 2 CAPSULES BY MOUTH EVERY NIGHT AT BEDTIME 180 capsule 0  . ZETIA 10 MG tablet TAKE 1 TABLET BY MOUTH EVERY DAY 30 tablet 6   No current facility-administered medications for this visit.    Allergies:   Cefuroxime axetil;  Macrodantin; Alendronate sodium; Codeine; Demerol; Meperidine hcl; Other; and Sulfonamide derivatives    Social History:  The patient  reports that she has quit smoking. Her smoking use included Cigarettes. She has a 1 pack-year smoking history. She has never used smokeless tobacco. She reports that she drinks alcohol. She reports that she does not use illicit drugs.   Family History:  The patient's family history includes Anxiety disorder in her mother; Bipolar disorder in her mother; Colon cancer in her father; HIV in her son; Heart disease in her maternal  grandmother; Mental illness in her mother; Stomach cancer in her maternal grandfather, mother, and paternal grandmother; Stroke in her father; Throat cancer in her maternal uncle. There is no history of Anesthesia problems or Diabetes.    ROS:  Please see the history of present illness.   Otherwise, review of systems are positive for none.   All other systems are reviewed and negative.    PHYSICAL EXAM: VS:  BP 102/58 mmHg  Pulse 67  Ht 5\' 1"  (1.549 m)  Wt 146 lb 9.6 oz (66.497 kg)  BMI 27.71 kg/m2 , BMI Body mass index is 27.71 kg/(m^2). GEN: Well nourished, well developed, in no acute distress HEENT: normal Neck: no JVD, carotid bruits, or masses Cardiac: RRR; no murmurs, rubs, or gallops,trace bilateral edema. Wearing right brace from chronic foot drop Respiratory:  clear to auscultation bilaterally, normal work of breathing GI: soft, nontender, nondistended, + BS MS: no deformity or atrophy Skin: warm and dry, no rash Neuro:  Strength and sensation are intact Psych: euthymic mood, full affect   EKG:  EKG is ordered today. The ekg ordered today demonstrates NSR HR 67. 1st deg AV block   Recent Labs: 01/29/2015: ALT 17 02/20/2015: B Natriuretic Peptide 125.1* 07/07/2015: Magnesium 2.3 08/05/2015: BUN 17; Creatinine, Ser 0.80; Hemoglobin 14.2; Platelets 219; Potassium 4.9; Sodium 137; TSH 4.003    Lipid Panel    Component  Value Date/Time   CHOL 187 08/03/2014 1053   TRIG 150.0* 08/03/2014 1053   TRIG 133 06/30/2006 1110   HDL 45.80 08/03/2014 1053   CHOLHDL 4 08/03/2014 1053   CHOLHDL 3.1 CALC 06/30/2006 1110   VLDL 30.0 08/03/2014 1053   LDLCALC 111* 08/03/2014 1053   LDLDIRECT 93.3 08/04/2008 0915      Wt Readings from Last 3 Encounters:  08/28/15 146 lb 9.6 oz (66.497 kg)  08/10/15 144 lb (65.318 kg)  07/07/15 143 lb 9.6 oz (65.137 kg)      Other studies Reviewed: Additional studies/ records that were reviewed today include: 2D ECHO Review of the above records demonstrates:  Echo 05/05/14: - Left ventricle: The cavity size was normal. There was mild concentric hypertrophy. Systolic function was normal. The estimated ejection fraction was in the range of 55% to 60%. Wall motion was normal; there were no regional wall motion abnormalities. The study is not technically sufficient to allow evaluation of LV diastolic function. - Atrial septum: No defect or patent foramen ovale was identified   ASSESSMENT AND PLAN:  Chelsea Hancock is a 81 y.o. female with a of HTN, HLD and afib/flutter who presents to clinic for post hospital follow up.   Paroxysmal fib/flutter- s/p RFCA on 08/10/15 -- Today she is maintaining NSR HR 67 -- Continue amiodarone 200mg  po qd, Cardizem 120mg  po qd and Bisoprolol 5mg  qd -- Eliquis 5mg  po BID,  HTN- BP 102/58 mm Hg. Continue Cardizem 120mg  po qd and Bisoprolol 5mg  q  HLD- continue statin and zetia   Current medicines are reviewed at length with the patient today.  The patient does not have concerns regarding medicines.  The following changes have been made:    Labs/ tests ordered today include:   Orders Placed This Encounter  Procedures  . EKG 12-Lead     Disposition:   FU with Roderic Palau NP and Dr Curt Bears as previously scheduled.   Renea Ee  08/28/2015 9:55 AM    [facility]  [facility] [place], [place], [place]  [address]  Phone: [phone]; Fax: [phone]

## 2015-08-28 ENCOUNTER — Ambulatory Visit (INDEPENDENT_AMBULATORY_CARE_PROVIDER_SITE_OTHER): Payer: Medicare Other | Admitting: Physician Assistant

## 2015-08-28 ENCOUNTER — Encounter: Payer: Self-pay | Admitting: Physician Assistant

## 2015-08-28 VITALS — BP 102/58 | HR 67 | Ht 61.0 in | Wt 146.6 lb

## 2015-08-28 DIAGNOSIS — E785 Hyperlipidemia, unspecified: Secondary | ICD-10-CM | POA: Diagnosis not present

## 2015-08-28 DIAGNOSIS — R739 Hyperglycemia, unspecified: Secondary | ICD-10-CM | POA: Diagnosis not present

## 2015-08-28 DIAGNOSIS — I4892 Unspecified atrial flutter: Secondary | ICD-10-CM

## 2015-08-28 DIAGNOSIS — I480 Paroxysmal atrial fibrillation: Secondary | ICD-10-CM | POA: Diagnosis not present

## 2015-08-28 DIAGNOSIS — I959 Hypotension, unspecified: Secondary | ICD-10-CM

## 2015-08-28 NOTE — Patient Instructions (Addendum)
Medication Instructions:  Your physician recommends that you continue on your current medications as directed. Please refer to the Current Medication list given to you today.   Labwork: None ordered  Testing/Procedures: None ordered  Follow-Up: Your physician recommends that you keep your scheduled follow-up appointment with Dr. Curt Bears.     Any Other Special Instructions Will Be Listed Below (If Applicable).   If you need a refill on your cardiac medications before your next appointment, please call your pharmacy.

## 2015-08-30 DIAGNOSIS — M25511 Pain in right shoulder: Secondary | ICD-10-CM | POA: Diagnosis not present

## 2015-08-31 ENCOUNTER — Ambulatory Visit: Payer: Medicare Other | Admitting: Cardiology

## 2015-09-06 ENCOUNTER — Ambulatory Visit (HOSPITAL_COMMUNITY)
Admission: RE | Admit: 2015-09-06 | Discharge: 2015-09-06 | Disposition: A | Discharge status: Home / Self Care | Payer: Medicare Other | Source: Ambulatory Visit | Attending: Nurse Practitioner | Admitting: Nurse Practitioner

## 2015-09-06 VITALS — BP 126/80 | HR 64 | Ht 61.0 in | Wt 147.0 lb

## 2015-09-06 DIAGNOSIS — I4819 Other persistent atrial fibrillation: Secondary | ICD-10-CM

## 2015-09-06 DIAGNOSIS — I4891 Unspecified atrial fibrillation: Secondary | ICD-10-CM | POA: Insufficient documentation

## 2015-09-06 DIAGNOSIS — I481 Persistent atrial fibrillation: Secondary | ICD-10-CM

## 2015-09-06 NOTE — Progress Notes (Signed)
Patient ID: JORDIS EZZO, female   DOB: 03-Mar-1934, 81 y.o.   MRN: [id number]     Primary Care Physician: [name], MD Referring Physician: Dr. Yehuda Savannah LEAN LOOSLI is a 81 y.o. female with a h/o afib/flutter that had afib/flutter ablation with Dr. Curt Bears 11/18. She was also started on amiodarone at that time. She is now on amiodarone 200 mg a day.She has not noticed any afib since the procedure. Denies any groin issues or dysphagia. Overall she is very pleased with the outcome of the procedure so far. Continues with blood thinner.  Today, she denies symptoms of palpitations, chest pain, shortness of breath, orthopnea, PND, lower extremity edema, dizziness, presyncope, syncope, or neurologic sequela. The patient is tolerating medications without difficulties and is otherwise without complaint today.   Past Medical History  Diagnosis Date  . Hyperlipidemia     takes Zetia and Simvastatin nightly  . Palpitation   . Carotid bruit     Right  . Edema   . Arthritis   . Allergy     portabella mushrooms/diarrhea  . Heart murmur   . Osteoporosis   . Fibromyalgia   . Thrush 03/23/2012  . Vaginitis 03/23/2012  . Sigmoid colon ulcer 03/23/2012  . Hyperglycemia 04/27/2012  . History of migraine     last about 2yrs ago  . Foot drop, right   . Joint pain   . Joint swelling   . Chronic back pain   . Skin problem in pregnancy     takes gabapentin 3 times a day and also has an ointment  . GERD (gastroesophageal reflux disease)     takes Zantac and Nexium daily  . Hemorrhoids   . Diverticulosis   . IBS (irritable bowel syndrome)     takes align daily  . Hypocalcemia 03/29/2013  . Depression with anxiety 03/29/2013    hx of  . Tachycardia 07/03/2013  . Onychomycosis 07/03/2013  . Adenomatous polyp of colon 1991, 2008  . Medicare annual wellness visit, subsequent 08/13/2014  . Dysrhythmia     paroxysmal A fib, irregular heartbeat;takes Bisoprolol nightly  .  Migraine 04/27/2012    last 40 years ago  . Falls frequently   . Memory loss 02/11/2015  . Atrial fibrillation and flutter [facility])    Past Surgical History  Procedure Laterality Date  . Abdominal hysterectomy  1981  . Lumbar laminectomy  1960    x 2  . Veins stripped  1970  . Total hip arthroplasty Left 2012  . Carpal tunnel release Left 2012    ulnar nerve release at elbow  . Cervical fusion  2003, 2005, 2013  . Tonsillectomy  1943  . Left elbow surgery  06/03/11    cyst removed  . Colonoscopy    . Posterior cervical fusion/foraminotomy  08/03/2012    Procedure: POSTERIOR CERVICAL FUSION/FORAMINOTOMY LEVEL 5;  Surgeon: [name], MD;  Location: [place] [facility] ORS;  Service: Neurosurgery;  Laterality: N/A;  Cervical four to Thoracic two Posterior cervical decompression with Facet and Pedicle screw fixation  . Back surgery  1997    reconstructive lower back surgery  . Wrist surgery Right 1990's    cyst removed  . Eye surgery  2015    b/l cataracts removed  . Total knee arthroplasty Right 11/20/2014    Procedure: RIGHT TOTAL KNEE ARTHROPLASTY;  Surgeon: [name], MD;  Location: [facility];  Service: Orthopedics;  Laterality: Right;  . Electrophysiologic study N/A 08/10/2015  Procedure: Afib;  Surgeon: Will Meredith Leeds, MD;  Location: [place] [facility] LAB;  Service: Cardiovascular;  Laterality: N/A;    Current Outpatient Prescriptions  Medication Sig Dispense Refill  . amiodarone (PACERONE) 200 MG tablet Take 1 tablet (200 mg total) by mouth 2 (two) times daily. Take BID for 1 week, then daily 40 tablet 11  . apixaban (ELIQUIS) 5 MG TABS tablet Take 1 tablet (5 mg total) by mouth 2 (two) times daily. 60 tablet 5  . atorvastatin (LIPITOR) 10 MG tablet Take 10 mg by mouth daily.    . Biotin 5000 MCG TABS Take 5,000 mcg by mouth daily.    . bisoprolol (ZEBETA) 5 MG tablet Take 5 mg by mouth daily.    . Calcium Carb-Cholecalciferol 600-200 MG-UNIT TABS Take 1 tablet by mouth daily.      Marland Kitchen diltiazem (CARDIZEM CD) 120 MG 24 hr capsule Take 1 capsule (120 mg total) by mouth daily. May take additional dose daily if has palpitation 30 capsule 5  . doxycycline (VIBRAMYCIN) 100 MG capsule Take 100 mg by mouth 2 (two) times daily. For 10 days. Started on 08-04-15    . eszopiclone (LUNESTA) 2 MG TABS tablet Take 1 tablet (2 mg total) by mouth at bedtime as needed for sleep. Take immediately before bedtime 30 tablet 2  . gabapentin (NEURONTIN) 600 MG tablet TAKE 1 TABLET(600 MG) BY MOUTH TWICE DAILY 180 tablet 0  . MEGARED OMEGA-3 KRILL OIL 500 MG CAPS Take 1 tablet by mouth daily.    . Melatonin 5 MG CAPS Take 5 mg by mouth at bedtime.     . Multiple Vitamins-Minerals (CENTRUM SILVER ULTRA WOMENS) TABS Take 1 tablet by mouth daily.    Marland Kitchen NEXIUM 40 MG capsule TAKE 1 CAPSULE BY MOUTH EVERY night 30 capsule 11  . nortriptyline (PAMELOR) 25 MG capsule TAKE 2 CAPSULES BY MOUTH EVERY NIGHT AT BEDTIME 180 capsule 0  . ZETIA 10 MG tablet TAKE 1 TABLET BY MOUTH EVERY DAY 30 tablet 6   No current facility-administered medications for this encounter.    Allergies  Allergen Reactions  . Cefuroxime Axetil Anaphylaxis  . Macrodantin Anaphylaxis  . Alendronate Sodium Other (See Comments)    Severe chest pain similar to heart attack   . Codeine Nausea And Vomiting  . Demerol [Meperidine] Other (See Comments)    hallucinations  . Meperidine Hcl Nausea And Vomiting  . Other Other (See Comments)    Hismanal and Maprobamate  portobello mushrooms - diarrhea  . Sulfonamide Derivatives Hives, Itching and Swelling    Social History   Social History  . Marital Status: Divorced    Spouse Name: N/A  . Number of Children: N/A  . Years of Education: N/A   Occupational History  . Not on file.   Social History Main Topics  . Smoking status: Former Smoker -- 0.25 packs/day for 4 years    Types: Cigarettes  . Smokeless tobacco: Never Used     Comment: quit 20+yrs ago  . Alcohol Use: 0.0  oz/week    0 Standard drinks or equivalent per week     Comment: [place]. 2 GLASSES RED WINE  . Drug Use: No  . Sexual Activity:    Partners: Male   Other Topics Concern  . Not on file   Social History Narrative    Family History  Problem Relation Age of Onset  . Bipolar disorder Mother   . Stomach cancer Mother   . Mental  illness Mother   . Stroke Father   . Colon cancer Father   . Throat cancer Maternal Uncle     larynx  . HIV Son     Aids  . Anesthesia problems Neg Hx   . Stomach cancer Maternal Grandfather   . Stomach cancer Paternal Grandmother   . Anxiety disorder Mother   . Diabetes Neg Hx   . Heart disease Maternal Grandmother     ROS- All systems are reviewed and negative except as per the HPI above  Physical Exam: Filed Vitals:   09/06/15 1120  BP: 126/80  Pulse: 64  Height: 5\' 1"  (1.549 m)  Weight: 147 lb (66.679 kg)    GEN- The patient is well appearing, alert and oriented x 3 today.   Head- normocephalic, atraumatic Eyes-  Sclera clear, conjunctiva pink Ears- hearing intact Oropharynx- clear Neck- supple, no JVP Lymph- no cervical lymphadenopathy Lungs- Clear to ausculation bilaterally, normal work of breathing Heart- Regular rate and rhythm, no murmurs, rubs or gallops, PMI not laterally displaced GI- soft, NT, ND, + BS Extremities- no clubbing, cyanosis, or edema MS- no significant deformity or atrophy Skin- no rash or lesion Psych- euthymic mood, full affect Neuro- strength and sensation are intact  EKG-NSR 64 bpm, PR int 200 ms, QRS 90 ms, Qtc 453 ms Epic records reviewed  Assessment and Plan: 1. afib S/p ablation Doing well No afib burden  Continue amiodarone at 200 mg a day Continue apixaban 5 mg bid  F/u with [facility] 2/20 afib clinic as needed  Butch Penny C. Kaleea Penner, [facility] [address] [place], [place] [address] [phone]

## 2015-09-10 ENCOUNTER — Telehealth: Payer: Self-pay | Admitting: *Deleted

## 2015-09-10 NOTE — Telephone Encounter (Signed)
Received PA request for Zetia from [place], Insurance is not updated in pt's EMR, pharmacy provided Pt ID number; patient is no longer our patient, last seen by Dr Charlett Blake in 01/2015; according to EMR, pt changed PCP in 02/2015 to Dr. Lajean Manes, MD with [facility]. New Information forwarded to pharmacy via fax/SLS

## 2015-09-18 ENCOUNTER — Other Ambulatory Visit: Payer: Self-pay

## 2015-09-18 MED ORDER — APIXABAN 5 MG PO TABS: 5.0000 mg | ORAL_TABLET | Freq: Two times a day (BID) | ORAL | Status: DC

## 2015-09-20 ENCOUNTER — Ambulatory Visit: Payer: Medicare Other | Admitting: Neurology

## 2015-09-25 DIAGNOSIS — H40013 Open angle with borderline findings, low risk, bilateral: Secondary | ICD-10-CM | POA: Diagnosis not present

## 2015-10-04 DIAGNOSIS — M25511 Pain in right shoulder: Secondary | ICD-10-CM | POA: Diagnosis not present

## 2015-10-06 ENCOUNTER — Other Ambulatory Visit: Payer: Self-pay | Admitting: Physician Assistant

## 2015-10-08 NOTE — Telephone Encounter (Signed)
From [place] [facility] - patient should contact PCP for refills. But please check with Dr. [name], as he may approve.  Thanks

## 2015-10-08 NOTE — Telephone Encounter (Signed)
Should this be denied and deferred to pcp?

## 2015-10-08 NOTE — Telephone Encounter (Signed)
Will forward to  Dr. Brackbill for review 

## 2015-10-10 NOTE — Telephone Encounter (Signed)
She should probably get this from her PCP going forward

## 2015-10-16 ENCOUNTER — Ambulatory Visit: Payer: Self-pay | Admitting: Pharmacist

## 2015-10-16 DIAGNOSIS — I4892 Unspecified atrial flutter: Secondary | ICD-10-CM

## 2015-10-31 ENCOUNTER — Telehealth: Payer: Self-pay

## 2015-10-31 MED ORDER — ESOMEPRAZOLE MAGNESIUM 40 MG PO CPDR
40.0000 mg | DELAYED_RELEASE_CAPSULE | Freq: Every day | ORAL | Status: DC
Start: 2015-10-31 — End: 2016-11-07

## 2015-10-31 NOTE — Telephone Encounter (Signed)
Generic Substitutions Request received from [facility].  Review suggestions with pt, she has not tired generic but she is will to try.  Orders updated in system for medication changes.  No questions at this time.

## 2015-11-09 ENCOUNTER — Other Ambulatory Visit: Payer: Self-pay | Admitting: Geriatric Medicine

## 2015-11-09 ENCOUNTER — Ambulatory Visit
Admission: RE | Admit: 2015-11-09 | Discharge: 2015-11-09 | Disposition: A | Discharge status: Home / Self Care | Payer: Medicare Other | Source: Ambulatory Visit | Attending: Geriatric Medicine | Admitting: Geriatric Medicine

## 2015-11-09 DIAGNOSIS — M5136 Other intervertebral disc degeneration, lumbar region: Secondary | ICD-10-CM | POA: Diagnosis not present

## 2015-11-09 DIAGNOSIS — M549 Dorsalgia, unspecified: Secondary | ICD-10-CM

## 2015-11-09 DIAGNOSIS — E7800 Pure hypercholesterolemia, unspecified: Secondary | ICD-10-CM | POA: Diagnosis not present

## 2015-11-11 NOTE — Progress Notes (Signed)
Electrophysiology Office Note   Date:  11/12/2015   ID:  Chelsea Hancock, DOB 08-May-1934, MRN [id number]  PCP:  [name], MD  Cardiologist:  [name] Primary Electrophysiologist:  Eldoris Beiser Meredith Leeds, MD    Chief Complaint  Patient presents with  . Follow-up     History of Present Illness: Chelsea Hancock is a 82 y.o. female who presents today for electrophysiology evaluation.   She had an ablation for atrial fib/flutter on 11/18. She started on amiodarone at that time. Today she feels well without complaint. She says that she has had much less fatigue, and no palpitations since her ablation. She is able to do all of her daily activities without issue.   Today, she denies symptoms of palpitations, chest pain, shortness of breath, orthopnea, PND, lower extremity edema, claudication, dizziness, presyncope, syncope, bleeding, or neurologic sequela. The patient is tolerating medications without difficulties and is otherwise without complaint today.    Past Medical History  Diagnosis Date  . Hyperlipidemia     takes Zetia and Simvastatin nightly  . Palpitation   . Carotid bruit     Right  . Edema   . Arthritis   . Allergy     portabella mushrooms/diarrhea  . Heart murmur   . Osteoporosis   . Fibromyalgia   . Thrush 03/23/2012  . Vaginitis 03/23/2012  . Sigmoid colon ulcer 03/23/2012  . Hyperglycemia 04/27/2012  . History of migraine     last about 76yrs ago  . Foot drop, right   . Joint pain   . Joint swelling   . Chronic back pain   . Skin problem in pregnancy     takes gabapentin 3 times a day and also has an ointment  . GERD (gastroesophageal reflux disease)     takes Zantac and Nexium daily  . Hemorrhoids   . Diverticulosis   . IBS (irritable bowel syndrome)     takes align daily  . Hypocalcemia 03/29/2013  . Depression with anxiety 03/29/2013    hx of  . Tachycardia 07/03/2013  . Onychomycosis 07/03/2013  . Adenomatous polyp of colon 1991,  2008  . Medicare annual wellness visit, subsequent 08/13/2014  . Dysrhythmia     paroxysmal A fib, irregular heartbeat;takes Bisoprolol nightly  . Migraine 04/27/2012    last 40 years ago  . Falls frequently   . Memory loss 02/11/2015  . Atrial fibrillation and flutter [facility])    Past Surgical History  Procedure Laterality Date  . Abdominal hysterectomy  1981  . Lumbar laminectomy  1960    x 2  . Veins stripped  1970  . Total hip arthroplasty Left 2012  . Carpal tunnel release Left 2012    ulnar nerve release at elbow  . Cervical fusion  2003, 2005, 2013  . Tonsillectomy  1943  . Left elbow surgery  06/03/11    cyst removed  . Colonoscopy    . Posterior cervical fusion/foraminotomy  08/03/2012    Procedure: POSTERIOR CERVICAL FUSION/FORAMINOTOMY LEVEL 5;  Surgeon: [name], MD;  Location: [facility];  Service: Neurosurgery;  Laterality: N/A;  Cervical four to Thoracic two Posterior cervical decompression with Facet and Pedicle screw fixation  . Back surgery  1997    reconstructive lower back surgery  . Wrist surgery Right 1990's    cyst removed  . Eye surgery  2015    b/l cataracts removed  . Total knee arthroplasty Right 11/20/2014    Procedure: RIGHT TOTAL KNEE ARTHROPLASTY;  Surgeon: [name], MD;  Location: [facility];  Service: Orthopedics;  Laterality: Right;  . Electrophysiologic study N/A 08/10/2015    Procedure: Afib;  Surgeon: Ciarrah Rae Meredith Leeds, MD;  Location: [facility];  Service: Cardiovascular;  Laterality: N/A;     Current Outpatient Prescriptions  Medication Sig Dispense Refill  . amiodarone (PACERONE) 200 MG tablet Take 1 tablet (200 mg total) by mouth 2 (two) times daily. Take BID for 1 week, then daily 40 tablet 11  . apixaban (ELIQUIS) 5 MG TABS tablet Take 1 tablet (5 mg total) by mouth 2 (two) times daily. 60 tablet 5  . atorvastatin (LIPITOR) 10 MG tablet Take 10 mg by mouth daily.    . Biotin 5000 MCG TABS Take 5,000 mcg by mouth daily.     . bisoprolol (ZEBETA) 5 MG tablet Take 5 mg by mouth daily.    . Calcium Carb-Cholecalciferol 600-200 MG-UNIT TABS Take 1 tablet by mouth daily.    Marland Kitchen diltiazem (CARDIZEM CD) 120 MG 24 hr capsule Take 1 capsule (120 mg total) by mouth daily. May take additional dose daily if has palpitation 30 capsule 5  . esomeprazole (NEXIUM) 40 MG capsule Take 1 capsule (40 mg total) by mouth daily at 12 noon. 90 capsule 3  . eszopiclone (LUNESTA) 2 MG TABS tablet Take 1 tablet (2 mg total) by mouth at bedtime as needed for sleep. Take immediately before bedtime 30 tablet 2  . gabapentin (NEURONTIN) 600 MG tablet TAKE 1 TABLET(600 MG) BY MOUTH TWICE DAILY 180 tablet 0  . MEGARED OMEGA-3 KRILL OIL 500 MG CAPS Take 1 tablet by mouth daily.    . Melatonin 5 MG CAPS Take 5 mg by mouth at bedtime.     . Multiple Vitamins-Minerals (CENTRUM SILVER ULTRA WOMENS) TABS Take 1 tablet by mouth daily.    . nortriptyline (PAMELOR) 25 MG capsule TAKE 2 CAPSULES BY MOUTH EVERY NIGHT AT BEDTIME 180 capsule 0  . ZETIA 10 MG tablet TAKE 1 TABLET BY MOUTH EVERY DAY 30 tablet 6   No current facility-administered medications for this visit.    Allergies:   Cefuroxime axetil; Macrodantin; Alendronate sodium; Codeine; Demerol; Meperidine hcl; Other; and Sulfonamide derivatives   Social History:  The patient  reports that she has quit smoking. Her smoking use included Cigarettes. She has a 1 pack-year smoking history. She has never used smokeless tobacco. She reports that she drinks alcohol. She reports that she does not use illicit drugs.   Family History:  The patient's family history includes Anxiety disorder in her mother; Bipolar disorder in her mother; Colon cancer in her father; HIV in her son; Heart disease in her maternal grandmother; Mental illness in her mother; Stomach cancer in her maternal grandfather, mother, and paternal grandmother; Stroke in her father; Throat cancer in her maternal uncle. There is no history of  Anesthesia problems or Diabetes.    ROS:  Please see the history of present illness.   All other systems are reviewed and negative.    PHYSICAL EXAM: VS:  BP 132/70 mmHg  Pulse 52  Ht 5\' 3"  (1.6 m)  Wt 146 lb 6.4 oz (66.407 kg)  BMI 25.94 kg/m2 , BMI Body mass index is 25.94 kg/(m^2). GEN: Well nourished, well developed, in no acute distress HEENT: normal Neck: no JVD, carotid bruits, or masses Cardiac: RRR; no murmurs, rubs, or gallops,no edema  Respiratory:  clear to auscultation bilaterally, normal work of breathing GI: soft, nontender, nondistended, + BS  MS: no deformity or atrophy Skin: warm and dry Neuro:  Strength and sensation are intact Psych: euthymic mood, full affect  EKG:  EKG is ordered today. The ekg ordered today shows sinus rhythm, rate 52  Recent Labs: 01/29/2015: ALT 17 02/20/2015: B Natriuretic Peptide 125.1* 07/07/2015: Magnesium 2.3 08/05/2015: BUN 17; Creatinine, Ser 0.80; Hemoglobin 14.2; Platelets 219; Potassium 4.9; Sodium 137; TSH 4.003    Lipid Panel     Component Value Date/Time   CHOL 187 08/03/2014 1053   TRIG 150.0* 08/03/2014 1053   TRIG 133 06/30/2006 1110   HDL 45.80 08/03/2014 1053   CHOLHDL 4 08/03/2014 1053   CHOLHDL 3.1 CALC 06/30/2006 1110   VLDL 30.0 08/03/2014 1053   LDLCALC 111* 08/03/2014 1053   LDLDIRECT 93.3 08/04/2008 0915     Wt Readings from Last 3 Encounters:  11/12/15 146 lb 6.4 oz (66.407 kg)  09/06/15 147 lb (66.679 kg)  08/28/15 146 lb 9.6 oz (66.497 kg)      Other studies Reviewed: Additional studies/ records that were reviewed today include: TTE 2015  Review of the above records today demonstrates: - Left ventricle: The cavity size was normal. There was mild concentric hypertrophy. Systolic function was normal. The estimated ejection fraction was in the range of 55% to 60%. Wall motion was normal; there were no regional wall motion abnormalities. The study is not technically sufficient to  allow evaluation of LV diastolic function. - Atrial septum: No defect or patent foramen ovale was identified.   ASSESSMENT AND PLAN:  1.  Atrial fibrillation: S/P ablation and put on amiodarone post ablation. On Eliqius 5 mg BID.  Today we Chelsea Hancock stop her amiodarone, as she is in normal rhythm. She is tolerating the Eliquis well, she has a CHADS2VASC score of 3.  This patients CHA2DS2-VASc Score and unadjusted Ischemic Stroke Rate (% per year) is equal to 3.2 % stroke rate/year from a score of 3  Above score calculated as 1 point each if present [CHF, HTN, DM, Vascular=MI/PAD/Aortic Plaque, Age if 65-74, or Female] Above score calculated as 2 points each if present [Age > 75, or Stroke/TIA/TE]  Current medicines are reviewed at length with the patient today.   The patient does not have concerns regarding her medicines.  The following changes were made today:  Stop amiodarone  Labs/ tests ordered today include:  No orders of the defined types were placed in this encounter.     Disposition:   FU with Chelsea Hancock 1 years  Signed, Chelsea Rooks Meredith Leeds, MD  11/12/2015 2:46 PM     [facility] [address] [place] [address] [phone] (office) [phone] (fax)

## 2015-11-12 ENCOUNTER — Encounter: Payer: Self-pay | Admitting: Cardiology

## 2015-11-12 ENCOUNTER — Ambulatory Visit (INDEPENDENT_AMBULATORY_CARE_PROVIDER_SITE_OTHER): Payer: Medicare Other | Admitting: Cardiology

## 2015-11-12 VITALS — BP 132/70 | HR 52 | Ht 63.0 in | Wt 146.4 lb

## 2015-11-12 DIAGNOSIS — I480 Paroxysmal atrial fibrillation: Secondary | ICD-10-CM | POA: Diagnosis not present

## 2015-11-19 DIAGNOSIS — M797 Fibromyalgia: Secondary | ICD-10-CM | POA: Diagnosis not present

## 2015-11-19 DIAGNOSIS — M810 Age-related osteoporosis without current pathological fracture: Secondary | ICD-10-CM | POA: Diagnosis not present

## 2015-11-19 DIAGNOSIS — M179 Osteoarthritis of knee, unspecified: Secondary | ICD-10-CM | POA: Diagnosis not present

## 2015-12-06 DIAGNOSIS — N390 Urinary tract infection, site not specified: Secondary | ICD-10-CM | POA: Diagnosis not present

## 2015-12-21 DIAGNOSIS — N390 Urinary tract infection, site not specified: Secondary | ICD-10-CM | POA: Diagnosis not present

## 2015-12-23 ENCOUNTER — Emergency Department (HOSPITAL_COMMUNITY): Payer: Medicare Other

## 2015-12-23 ENCOUNTER — Emergency Department (HOSPITAL_COMMUNITY)
Admission: EM | Admit: 2015-12-23 | Discharge: 2015-12-23 | Disposition: A | Discharge status: Home / Self Care | Payer: Medicare Other | Attending: Emergency Medicine | Admitting: Emergency Medicine

## 2015-12-23 ENCOUNTER — Encounter (HOSPITAL_COMMUNITY): Payer: Self-pay | Admitting: *Deleted

## 2015-12-23 DIAGNOSIS — Z8742 Personal history of other diseases of the female genital tract: Secondary | ICD-10-CM | POA: Diagnosis not present

## 2015-12-23 DIAGNOSIS — K219 Gastro-esophageal reflux disease without esophagitis: Secondary | ICD-10-CM | POA: Insufficient documentation

## 2015-12-23 DIAGNOSIS — G43909 Migraine, unspecified, not intractable, without status migrainosus: Secondary | ICD-10-CM | POA: Insufficient documentation

## 2015-12-23 DIAGNOSIS — G8929 Other chronic pain: Secondary | ICD-10-CM | POA: Diagnosis not present

## 2015-12-23 DIAGNOSIS — Z8619 Personal history of other infectious and parasitic diseases: Secondary | ICD-10-CM | POA: Diagnosis not present

## 2015-12-23 DIAGNOSIS — R0789 Other chest pain: Secondary | ICD-10-CM | POA: Diagnosis not present

## 2015-12-23 DIAGNOSIS — F418 Other specified anxiety disorders: Secondary | ICD-10-CM | POA: Insufficient documentation

## 2015-12-23 DIAGNOSIS — E785 Hyperlipidemia, unspecified: Secondary | ICD-10-CM | POA: Insufficient documentation

## 2015-12-23 DIAGNOSIS — R011 Cardiac murmur, unspecified: Secondary | ICD-10-CM | POA: Diagnosis not present

## 2015-12-23 DIAGNOSIS — Z86010 Personal history of colonic polyps: Secondary | ICD-10-CM | POA: Insufficient documentation

## 2015-12-23 DIAGNOSIS — I4892 Unspecified atrial flutter: Secondary | ICD-10-CM | POA: Diagnosis not present

## 2015-12-23 DIAGNOSIS — M1990 Unspecified osteoarthritis, unspecified site: Secondary | ICD-10-CM | POA: Diagnosis not present

## 2015-12-23 DIAGNOSIS — M797 Fibromyalgia: Secondary | ICD-10-CM | POA: Diagnosis not present

## 2015-12-23 DIAGNOSIS — M810 Age-related osteoporosis without current pathological fracture: Secondary | ICD-10-CM | POA: Diagnosis not present

## 2015-12-23 DIAGNOSIS — I4891 Unspecified atrial fibrillation: Secondary | ICD-10-CM | POA: Diagnosis not present

## 2015-12-23 DIAGNOSIS — R079 Chest pain, unspecified: Secondary | ICD-10-CM | POA: Diagnosis not present

## 2015-12-23 LAB — BASIC METABOLIC PANEL
Anion gap: 9 (ref 5–15)
BUN: 22 mg/dL — ABNORMAL HIGH (ref 6–20)
CO2: 23 mmol/L (ref 22–32)
Calcium: 9.6 mg/dL (ref 8.9–10.3)
Chloride: 106 mmol/L (ref 101–111)
Creatinine, Ser: 0.83 mg/dL (ref 0.44–1.00)
GFR calc Af Amer: >60 mL/min (ref >60)
GFR calc non Af Amer: >60 mL/min (ref >60)
Glucose, Bld: 118 mg/dL — ABNORMAL HIGH (ref 65–99)
Potassium: 3.6 mmol/L (ref 3.5–5.1)
Sodium: 138 mmol/L (ref 135–145)

## 2015-12-23 LAB — TROPONIN I: Troponin I: <0.03 ng/mL (ref <0.031)

## 2015-12-23 LAB — CBC
HCT: 41.6 % (ref 36.0–46.0)
Hemoglobin: 13.8 g/dL (ref 12.0–15.0)
MCH: 30.6 pg (ref 26.0–34.0)
MCHC: 33.2 g/dL (ref 30.0–36.0)
MCV: 92.2 fL (ref 78.0–100.0)
Platelets: 216 10*3/uL (ref 150–400)
RBC: 4.51 MIL/uL (ref 3.87–5.11)
RDW: 14.1 % (ref 11.5–15.5)
WBC: 6.9 10*3/uL (ref 4.0–10.5)

## 2015-12-23 LAB — I-STAT TROPONIN, ED: Troponin i, poc: 0 ng/mL (ref 0.00–0.08)

## 2015-12-23 MED ORDER — ASPIRIN 81 MG PO CHEW
324.0000 mg | CHEWABLE_TABLET | Freq: Once | ORAL | Status: AC
  Administered 2015-12-23: 324 mg via ORAL
  Filled 2015-12-23: qty 4

## 2015-12-23 NOTE — ED Notes (Signed)
Patient transported to X-ray 

## 2015-12-23 NOTE — Discharge Instructions (Signed)
Nonspecific Chest Pain  Your testing shows no evidence of heart attack. Follow-up with your cardiologist this week. Return to the ED if you develop worsening symptoms. Chest pain can be caused by many different conditions. There is always a chance that your pain could be related to something serious, such as a heart attack or a blood clot in your lungs. Chest pain can also be caused by conditions that are not life-threatening. If you have chest pain, it is very important to follow up with your health care provider. CAUSES  Chest pain can be caused by:  Heartburn.  Pneumonia or bronchitis.  Anxiety or stress.  Inflammation around your heart (pericarditis) or lung (pleuritis or pleurisy).  A blood clot in your lung.  A collapsed lung (pneumothorax). It can develop suddenly on its own (spontaneous pneumothorax) or from trauma to the chest.  Shingles infection (varicella-zoster virus).  Heart attack.  Damage to the bones, muscles, and cartilage that make up your chest wall. This can include:  Bruised bones due to injury.  Strained muscles or cartilage due to frequent or repeated coughing or overwork.  Fracture to one or more ribs.  Sore cartilage due to inflammation (costochondritis). RISK FACTORS  Risk factors for chest pain may include:  Activities that increase your risk for trauma or injury to your chest.  Respiratory infections or conditions that cause frequent coughing.  Medical conditions or overeating that can cause heartburn.  Heart disease or family history of heart disease.  Conditions or health behaviors that increase your risk of developing a blood clot.  Having had chicken pox (varicella zoster). SIGNS AND SYMPTOMS Chest pain can feel like:  Burning or tingling on the surface of your chest or deep in your chest.  Crushing, pressure, aching, or squeezing pain.  Dull or sharp pain that is worse when you move, cough, or take a deep breath.  Pain that is  also felt in your back, neck, shoulder, or arm, or pain that spreads to any of these areas. Your chest pain may come and go, or it may stay constant. DIAGNOSIS Lab tests or other studies may be needed to find the cause of your pain. Your health care provider may have you take a test called an ambulatory ECG (electrocardiogram). An ECG records your heartbeat patterns at the time the test is performed. You may also have other tests, such as:  Transthoracic echocardiogram (TTE). During echocardiography, sound waves are used to create a picture of all of the heart structures and to look at how blood flows through your heart.  Transesophageal echocardiogram (TEE).This is a more advanced imaging test that obtains images from inside your body. It allows your health care provider to see your heart in finer detail.  Cardiac monitoring. This allows your health care provider to monitor your heart rate and rhythm in real time.  Holter monitor. This is a portable device that records your heartbeat and can help to diagnose abnormal heartbeats. It allows your health care provider to track your heart activity for several days, if needed.  Stress tests. These can be done through exercise or by taking medicine that makes your heart beat more quickly.  Blood tests.  Imaging tests. TREATMENT  Your treatment depends on what is causing your chest pain. Treatment may include:  Medicines. These may include:  Acid blockers for heartburn.  Anti-inflammatory medicine.  Pain medicine for inflammatory conditions.  Antibiotic medicine, if an infection is present.  Medicines to dissolve blood clots.  Medicines  to treat coronary artery disease.  Supportive care for conditions that do not require medicines. This may include:  Resting.  Applying heat or cold packs to injured areas.  Limiting activities until pain decreases. HOME CARE INSTRUCTIONS  If you were prescribed an antibiotic medicine, finish it  all even if you start to feel better.  Avoid any activities that bring on chest pain.  Do not use any tobacco products, including cigarettes, chewing tobacco, or electronic cigarettes. If you need help quitting, ask your health care provider.  Do not drink alcohol.  Take medicines only as directed by your health care provider.  Keep all follow-up visits as directed by your health care provider. This is important. This includes any further testing if your chest pain does not go away.  If heartburn is the cause for your chest pain, you may be told to keep your head raised (elevated) while sleeping. This reduces the chance that acid will go from your stomach into your esophagus.  Make lifestyle changes as directed by your health care provider. These may include:  Getting regular exercise. Ask your health care provider to suggest some activities that are safe for you.  Eating a heart-healthy diet. A registered dietitian can help you to learn healthy eating options.  Maintaining a healthy weight.  Managing diabetes, if necessary.  Reducing stress. SEEK MEDICAL CARE IF:  Your chest pain does not go away after treatment.  You have a rash with blisters on your chest.  You have a fever. SEEK IMMEDIATE MEDICAL CARE IF:   Your chest pain is worse.  You have an increasing cough, or you cough up blood.  You have severe abdominal pain.  You have severe weakness.  You faint.  You have chills.  You have sudden, unexplained chest discomfort.  You have sudden, unexplained discomfort in your arms, back, neck, or jaw.  You have shortness of breath at any time.  You suddenly start to sweat, or your skin gets clammy.  You feel nauseous or you vomit.  You suddenly feel light-headed or dizzy.  Your heart begins to beat quickly, or it feels like it is skipping beats. These symptoms may represent a serious problem that is an emergency. Do not wait to see if the symptoms will go away.  Get medical help right away. Call your local emergency services (911 in the [place] Do not drive yourself to the hospital.   This information is not intended to replace advice given to you by your health care provider. Make sure you discuss any questions you have with your health care provider.   Document Released: 06/18/2005 Document Revised: 09/29/2014 Document Reviewed: 04/14/2014 Elsevier Interactive Patient Education [employer].

## 2015-12-23 NOTE — ED Notes (Signed)
Pt in from home c/o L sided CP onset yesterday, pt denies SOB, v/d, pt c/o nausea, pt A&O x4, follows commands, speaks in complete sentences

## 2015-12-23 NOTE — ED Provider Notes (Signed)
CSN: [id number]     Arrival date & time 12/23/15  0730 History   First MD Initiated Contact with Patient 12/23/15 [phone]     Chief Complaint  Patient presents with  . Chest Pain     (Consider location/radiation/quality/duration/timing/severity/associated sxs/prior Treatment) HPI Comments: Patient with history of atrial fibrillation status post ablation presenting with intermittent chest pain since yesterday. She describes a sensation of left-sided chest pain lasting over one hour at a time. Nothing makes it better or worse. She had some pain yesterday we'll doing yardwork. Pain did not radiate. There is no shortness of breath, nausea, vomiting, diaphoresis or syncope. She's not had pain like this in the past. She also describes a feeling a pounding sensation of her heart beat. She has been taking her medications including her Eliquis. Had a stress test last summer that was reassuring. Denies any leg pain or leg swelling. No abdominal pain, nausea or vomiting.  Patient is a 82 y.o. female presenting with chest pain. The history is provided by the patient and a relative.  Chest Pain Associated symptoms: no abdominal pain, no cough, no dizziness, no headache, no nausea, no shortness of breath, not vomiting and no weakness     Past Medical History  Diagnosis Date  . Hyperlipidemia     takes Zetia and Simvastatin nightly  . Palpitation   . Carotid bruit     Right  . Edema   . Arthritis   . Allergy     portabella mushrooms/diarrhea  . Heart murmur   . Osteoporosis   . Fibromyalgia   . Thrush 03/23/2012  . Vaginitis 03/23/2012  . Sigmoid colon ulcer 03/23/2012  . Hyperglycemia 04/27/2012  . History of migraine     last about 70yrs ago  . Foot drop, right   . Joint pain   . Joint swelling   . Chronic back pain   . Skin problem in pregnancy     takes gabapentin 3 times a day and also has an ointment  . GERD (gastroesophageal reflux disease)     takes Zantac and Nexium daily  . Hemorrhoids    . Diverticulosis   . IBS (irritable bowel syndrome)     takes align daily  . Hypocalcemia 03/29/2013  . Depression with anxiety 03/29/2013    hx of  . Tachycardia 07/03/2013  . Onychomycosis 07/03/2013  . Adenomatous polyp of colon 1991, 2008  . Medicare annual wellness visit, subsequent 08/13/2014  . Dysrhythmia     paroxysmal A fib, irregular heartbeat;takes Bisoprolol nightly  . Migraine 04/27/2012    last 40 years ago  . Falls frequently   . Memory loss 02/11/2015  . Atrial fibrillation and flutter [facility])    Past Surgical History  Procedure Laterality Date  . Abdominal hysterectomy  1981  . Lumbar laminectomy  1960    x 2  . Veins stripped  1970  . Total hip arthroplasty Left 2012  . Carpal tunnel release Left 2012    ulnar nerve release at elbow  . Cervical fusion  2003, 2005, 2013  . Tonsillectomy  1943  . Left elbow surgery  06/03/11    cyst removed  . Colonoscopy    . Posterior cervical fusion/foraminotomy  08/03/2012    Procedure: POSTERIOR CERVICAL FUSION/FORAMINOTOMY LEVEL 5;  Surgeon: [name], MD;  Location: [place] [facility];  Service: Neurosurgery;  Laterality: N/A;  Cervical four to Thoracic two Posterior cervical decompression with Facet and Pedicle screw fixation  . Back surgery  1997    reconstructive lower back surgery  . Wrist surgery Right 1990's    cyst removed  . Eye surgery  2015    b/l cataracts removed  . Total knee arthroplasty Right 11/20/2014    Procedure: RIGHT TOTAL KNEE ARTHROPLASTY;  Surgeon: [name], MD;  Location: [facility];  Service: Orthopedics;  Laterality: Right;  . Electrophysiologic study N/A 08/10/2015    Procedure: Afib;  Surgeon: Will Meredith Leeds, MD;  Location: [place] [facility];  Service: Cardiovascular;  Laterality: N/A;   Family History  Problem Relation Age of Onset  . Bipolar disorder Mother   . Stomach cancer Mother   . Mental illness Mother   . Stroke Father   . Colon cancer Father   . Throat cancer Maternal  Uncle     larynx  . HIV Son     Aids  . Anesthesia problems Neg Hx   . Stomach cancer Maternal Grandfather   . Stomach cancer Paternal Grandmother   . Anxiety disorder Mother   . Diabetes Neg Hx   . Heart disease Maternal Grandmother    Social History  Substance Use Topics  . Smoking status: Former Smoker -- 0.25 packs/day for 4 years    Types: Cigarettes  . Smokeless tobacco: Never Used     Comment: quit 20+yrs ago  . Alcohol Use: 0.0 oz/week    0 Standard drinks or equivalent per week     Comment: WINE WITH DINNER. 2 GLASSES RED WINE   OB History    No data available     Review of Systems  Constitutional: Negative for activity change and appetite change.  HENT: Negative for congestion and rhinorrhea.   Respiratory: Positive for chest tightness. Negative for cough and shortness of breath.   Cardiovascular: Positive for chest pain. Negative for leg swelling.  Gastrointestinal: Negative for nausea, vomiting and abdominal pain.  Genitourinary: Negative for dysuria, hematuria, vaginal bleeding and vaginal discharge.  Musculoskeletal: Negative for myalgias and arthralgias.  Skin: Negative for rash.  Neurological: Negative for dizziness, weakness and headaches.  A complete 10 system review of systems was obtained and all systems are negative except as noted in the HPI and PMH.      Allergies  Cefuroxime axetil; Macrodantin; Alendronate sodium; Codeine; Demerol; Meperidine hcl; Other; and Sulfonamide derivatives  Home Medications   Prior to Admission medications   Medication Sig Start Date End Date Taking? Authorizing Provider  apixaban (ELIQUIS) 5 MG TABS tablet Take 1 tablet (5 mg total) by mouth 2 (two) times daily. 09/18/15  Yes Darlin Coco, MD  atorvastatin (LIPITOR) 10 MG tablet Take 10 mg by mouth daily.   Yes Historical Provider, MD  Biotin 5000 MCG TABS Take 5,000 mcg by mouth daily.   Yes Historical Provider, MD  bisoprolol (ZEBETA) 5 MG tablet Take 5 mg by  mouth daily.   Yes Historical Provider, MD  Calcium Carb-Cholecalciferol 600-200 MG-UNIT TABS Take 1 tablet by mouth daily.   Yes Historical Provider, MD  diltiazem (CARDIZEM CD) 120 MG 24 hr capsule Take 1 capsule (120 mg total) by mouth daily. May take additional dose daily if has palpitation 07/07/15  Yes Almyra Deforest, PA  esomeprazole (NEXIUM) 40 MG capsule Take 1 capsule (40 mg total) by mouth daily at 12 noon. 10/31/15  Yes Rhonda G Barrett, PA-C  eszopiclone (LUNESTA) 2 MG TABS tablet Take 1 tablet (2 mg total) by mouth at bedtime as needed for sleep. Take immediately before bedtime 02/26/15  Yes Mosie Lukes, MD  gabapentin (NEURONTIN) 600 MG tablet TAKE 1 TABLET(600 MG) BY MOUTH TWICE DAILY 08/13/15  Yes Darlin Coco, MD  MEGARED OMEGA-3 KRILL OIL 500 MG CAPS Take 1 tablet by mouth daily.   Yes Historical Provider, MD  Melatonin 5 MG CAPS Take 5 mg by mouth at bedtime.    Yes Historical Provider, MD  Multiple Vitamins-Minerals (CENTRUM SILVER ULTRA WOMENS) TABS Take 1 tablet by mouth daily.   Yes Historical Provider, MD  nortriptyline (PAMELOR) 25 MG capsule TAKE 2 CAPSULES BY MOUTH EVERY NIGHT AT BEDTIME 05/30/15  Yes Mosie Lukes, MD  ZETIA 10 MG tablet TAKE 1 TABLET BY MOUTH EVERY DAY 04/06/15  Yes Mosie Lukes, MD  amiodarone (PACERONE) 200 MG tablet Take 1 tablet (200 mg total) by mouth 2 (two) times daily. Take BID for 1 week, then daily 08/11/15   Rhonda G Barrett, PA-C   BP 138/71 mmHg  Pulse 61  Temp(Src) 97.8 F (36.6 C) (Oral)  Resp 16  Ht 5\' 5"  (1.651 m)  Wt 144 lb 9 oz (65.573 kg)  BMI 24.06 kg/m2  SpO2 100% Physical Exam  Constitutional: She is oriented to person, place, and time. She appears well-developed and well-nourished. No distress.  HENT:  Head: Normocephalic and atraumatic.  Mouth/Throat: Oropharynx is clear and moist. No oropharyngeal exudate.  Burn to forehead from curling iron, denies trauma  Eyes: Conjunctivae and EOM are normal. Pupils are equal, round,  and reactive to light.  Neck: Normal range of motion. Neck supple.  No meningismus.  Cardiovascular: Normal rate, regular rhythm, normal heart sounds and intact distal pulses.   No murmur heard. Pulmonary/Chest: Effort normal and breath sounds normal. No respiratory distress. She exhibits no tenderness.  Abdominal: Soft. There is no tenderness. There is no rebound and no guarding.  Musculoskeletal: Normal range of motion. She exhibits no edema or tenderness.  Neurological: She is alert and oriented to person, place, and time. No cranial nerve deficit. She exhibits normal muscle tone. Coordination normal.  No ataxia on finger to nose bilaterally. No pronator drift. 5/5 strength throughout. CN 2-12 intact.Equal grip strength. Sensation intact.   Skin: Skin is warm.  Psychiatric: She has a normal mood and affect. Her behavior is normal.  Nursing note and vitals reviewed.   ED Course  Procedures (including critical care time) Labs Review Labs Reviewed  BASIC METABOLIC PANEL - Abnormal; Notable for the following:    Glucose, Bld 118 (*)    BUN 22 (*)    All other components within normal limits  CBC  TROPONIN I  Randolm Idol, ED    Imaging Review Dg Chest 2 View  12/23/2015  CLINICAL DATA:  Acute left-sided chest pain. EXAM: CHEST  2 VIEW COMPARISON:  August 05, 2015. FINDINGS: The heart size and mediastinal contours are within normal limits. Both lungs are clear. No pneumothorax or pleural effusion is noted. Status post surgical fusion of lower cervical spine. IMPRESSION: No active cardiopulmonary disease. Electronically Signed   By: Marijo Conception, M.D.   On: 12/23/2015 08:45   I have personally reviewed and evaluated these images and lab results as part of my medical decision-making.   EKG Interpretation   Date/Time:  Sunday December 23 2015 07:34:35 EDT Ventricular Rate:  66 PR Interval:  200 QRS Duration: 92 QT Interval:  454 QTC Calculation: 475 R Axis:   -41 Text  Interpretation:  Normal sinus rhythm Left axis deviation Nonspecific  ST and T  wave abnormality Prolonged QT Abnormal ECG No significant change  was found Confirmed by Wyvonnia Dusky  MD, Jeanna Giuffre [phone]) on 12/23/2015 8:12:09 AM      MDM   Final diagnoses:  Atypical chest pain   Intermittent chest pain and palpitations since yesterday. No chest pain currently. EKG reassuring, not  In atrial fibrillation now.  Stress test last June reviewed. Patient also had CT coronary in November with minimal disease and calcium score of 8.  D/w Dr. Haroldine Laws.  He agrees presentation is atypical for ACS and patient had recent reassuring workup. Recommends serial troponins.  Troponin negative 2. No further episodes of chest pain. Patient tolerating by mouth and ambulatory.  Her chest pain is atypical for ACS. Doubt PE. Doubt pneumothorax, doubt aortic dissection. Follow-up with her PCP and cardiologist this week. Return precautions discussed.   Ezequiel Essex, MD 12/23/15 [phone]

## 2015-12-24 DIAGNOSIS — R002 Palpitations: Secondary | ICD-10-CM | POA: Diagnosis not present

## 2015-12-24 NOTE — Progress Notes (Signed)
Cardiology Office Note:    Date:  12/25/2015   ID:  Chelsea Hancock, DOB 05/05/1934, MRN [id number]  PCP:  [name], MD  Cardiologist:  Dr. Darlin Coco   Electrophysiologist:  Dr. Allegra Lai   Chief Complaint  Patient presents with  . Hospitalization Follow-up    ED on 12/23/15 for chest pain    History of Present Illness:     Chelsea Hancock is a 82 y.o. female with a hx of HTN, HL, atrial fibrillation/flutter. She has been admitted several times with symptomatic atrial flutter. She has a history of post termination bradycardia. She was maintained on flecainide and then switched to amiodarone. She underwent PVI ablation for atrial fibrillation 11/16 With Dr. Curt Bears. Last seen 11/12/15. Amiodarone was stopped. She has been continued on Eliquis for anticoagulation.  She was seen in the emergency room 12/23/15 for chest pain. Cardiac markers were normal. Chest x-ray was unremarkable. ECG was without acute change. Outpatient follow-up recommended.    Here today with her friend. When she went to the emergency room, she had had about 1-1/2 days of palpitations. She describes these as hard heartbeats. They are not fast. She is not sure if they were like her previous atrial fibrillation. She felt heavy in her chest and some left-sided pain only with palpitations. She denies exertional chest symptoms. She denies syncope, near-syncope, dyspnea, associated nausea.  She felt somewhat diaphoretic. She denies any radiating symptoms. She denies orthopnea, PND or edema. She's not had a recurrence since going emergency room.   Past Medical History  Diagnosis Date  . Hyperlipidemia     takes Zetia and Simvastatin nightly  . Palpitation   . Carotid bruit     Right  . Edema   . Arthritis   . Allergy     portabella mushrooms/diarrhea  . Heart murmur   . Osteoporosis   . Fibromyalgia   . Thrush 03/23/2012  . Vaginitis 03/23/2012  . Sigmoid colon ulcer 03/23/2012  .  Hyperglycemia 04/27/2012  . History of migraine     last about 51yrs ago  . Foot drop, right   . Joint pain   . Joint swelling   . Chronic back pain   . Skin problem in pregnancy     takes gabapentin 3 times a day and also has an ointment  . GERD (gastroesophageal reflux disease)     takes Zantac and Nexium daily  . Hemorrhoids   . Diverticulosis   . IBS (irritable bowel syndrome)     takes align daily  . Hypocalcemia 03/29/2013  . Depression with anxiety 03/29/2013    hx of  . Tachycardia 07/03/2013  . Onychomycosis 07/03/2013  . Adenomatous polyp of colon 1991, 2008  . Medicare annual wellness visit, subsequent 08/13/2014  . Dysrhythmia     paroxysmal A fib, irregular heartbeat;takes Bisoprolol nightly  . Migraine 04/27/2012    last 40 years ago  . Falls frequently   . Memory loss 02/11/2015  . Atrial fibrillation and flutter [facility])     Past Surgical History  Procedure Laterality Date  . Abdominal hysterectomy  1981  . Lumbar laminectomy  1960    x 2  . Veins stripped  1970  . Total hip arthroplasty Left 2012  . Carpal tunnel release Left 2012    ulnar nerve release at elbow  . Cervical fusion  2003, 2005, 2013  . Tonsillectomy  1943  . Left elbow surgery  06/03/11    cyst removed  .  Colonoscopy    . Posterior cervical fusion/foraminotomy  08/03/2012    Procedure: POSTERIOR CERVICAL FUSION/FORAMINOTOMY LEVEL 5;  Surgeon: [name], MD;  Location: [place] [facility];  Service: Neurosurgery;  Laterality: N/A;  Cervical four to Thoracic two Posterior cervical decompression with Facet and Pedicle screw fixation  . Back surgery  1997    reconstructive lower back surgery  . Wrist surgery Right 1990's    cyst removed  . Eye surgery  2015    b/l cataracts removed  . Total knee arthroplasty Right 11/20/2014    Procedure: RIGHT TOTAL KNEE ARTHROPLASTY;  Surgeon: [name], MD;  Location: [facility];  Service: Orthopedics;  Laterality: Right;  . Electrophysiologic study N/A  08/10/2015    Procedure: Afib;  Surgeon: Will Meredith Leeds, MD;  Location: [place] [facility];  Service: Cardiovascular;  Laterality: N/A;    Current Medications: Outpatient Prescriptions Prior to Visit  Medication Sig Dispense Refill  . amiodarone (PACERONE) 200 MG tablet Take 1 tablet (200 mg total) by mouth 2 (two) times daily. Take BID for 1 week, then daily 40 tablet 11  . apixaban (ELIQUIS) 5 MG TABS tablet Take 1 tablet (5 mg total) by mouth 2 (two) times daily. 60 tablet 5  . atorvastatin (LIPITOR) 10 MG tablet Take 10 mg by mouth daily.    . Biotin 5000 MCG TABS Take 5,000 mcg by mouth daily.    . bisoprolol (ZEBETA) 5 MG tablet Take 5 mg by mouth daily. Ok to take extra 2.5 mg as needed for palpitations    . Calcium Carb-Cholecalciferol 600-200 MG-UNIT TABS Take 1 tablet by mouth daily.    Marland Kitchen diltiazem (CARDIZEM CD) 120 MG 24 hr capsule Take 1 capsule (120 mg total) by mouth daily. May take additional dose daily if has palpitation 30 capsule 5  . esomeprazole (NEXIUM) 40 MG capsule Take 1 capsule (40 mg total) by mouth daily at 12 noon. 90 capsule 3  . eszopiclone (LUNESTA) 2 MG TABS tablet Take 1 tablet (2 mg total) by mouth at bedtime as needed for sleep. Take immediately before bedtime 30 tablet 2  . gabapentin (NEURONTIN) 600 MG tablet TAKE 1 TABLET(600 MG) BY MOUTH TWICE DAILY 180 tablet 0  . MEGARED OMEGA-3 KRILL OIL 500 MG CAPS Take 1 tablet by mouth daily.    . Melatonin 5 MG CAPS Take 5 mg by mouth at bedtime.     . Multiple Vitamins-Minerals (CENTRUM SILVER ULTRA WOMENS) TABS Take 1 tablet by mouth daily.    . nortriptyline (PAMELOR) 25 MG capsule TAKE 2 CAPSULES BY MOUTH EVERY NIGHT AT BEDTIME 180 capsule 0  . ZETIA 10 MG tablet TAKE 1 TABLET BY MOUTH EVERY DAY 30 tablet 6   No facility-administered medications prior to visit.     Allergies:   Cefuroxime axetil; Macrodantin; Alendronate sodium; Codeine; Demerol; Meperidine hcl; Other; and Sulfonamide derivatives    Social History   Social History  . Marital Status: Divorced    Spouse Name: N/A  . Number of Children: N/A  . Years of Education: N/A   Social History Main Topics  . Smoking status: Former Smoker -- 0.25 packs/day for 4 years    Types: Cigarettes  . Smokeless tobacco: Never Used     Comment: quit 20+yrs ago  . Alcohol Use: 0.0 oz/week    0 Standard drinks or equivalent per week     Comment: [place]. 2 GLASSES RED WINE  . Drug Use: No  . Sexual  Activity:    Partners: Male   Other Topics Concern  . None   Social History Narrative     Family History:  The patient's family history includes Anxiety disorder in her mother; Bipolar disorder in her mother; Colon cancer in her father; HIV in her son; Heart disease in her maternal grandmother; Mental illness in her mother; Stomach cancer in her maternal grandfather, mother, and paternal grandmother; Stroke in her father; Throat cancer in her maternal uncle. There is no history of Anesthesia problems or Diabetes.   ROS:   Please see the history of present illness.    Review of Systems  Neurological: Positive for loss of balance.   All other systems reviewed and are negative.   Physical Exam:    VS:  BP 128/62 mmHg  Pulse 64  Ht 5' 3.5" (1.613 m)  Wt 145 lb 3.2 oz (65.862 kg)  BMI 25.31 kg/m2   GEN: Well nourished, well developed, in no acute distress HEENT: normal Neck: no JVD, no masses Cardiac: Normal S1/S2,  RRR; no murmurs, rubs, or gallops, no edema    Respiratory:  clear to auscultation bilaterally; no wheezing, rhonchi or rales GI: soft, nontender, nondistended MS: no deformity or atrophy Skin: warm and dry Neuro: No focal deficits  Psych: Alert and oriented x 3, normal affect  Wt Readings from Last 3 Encounters:  12/25/15 145 lb 3.2 oz (65.862 kg)  12/23/15 144 lb 9 oz (65.573 kg)  11/12/15 146 lb 6.4 oz (66.407 kg)      Studies/Labs Reviewed:     EKG:  EKG is not ordered today.  The ekg ordered  today demonstrates n/a ECG the emergency room 12/23/15 demonstrated NSR, HR 66, LAD, nonspecific ST-T wave changes, QTc 425 ms  Recent Labs: 01/29/2015: ALT 17 02/20/2015: B Natriuretic Peptide 125.1* 12/23/2015: BUN 22*; Creatinine, Ser 0.83; Hemoglobin 13.8; Platelets 216; Potassium 3.6; Sodium 138 12/25/2015: Magnesium 1.9; TSH 6.41*   Recent Lipid Panel    Component Value Date/Time   CHOL 187 08/03/2014 1053   TRIG 150.0* 08/03/2014 1053   TRIG 133 06/30/2006 1110   HDL 45.80 08/03/2014 1053   CHOLHDL 4 08/03/2014 1053   CHOLHDL 3.1 CALC 06/30/2006 1110   VLDL 30.0 08/03/2014 1053   LDLCALC 111* 08/03/2014 1053   LDLDIRECT 93.3 08/04/2008 0915    Additional studies/ records that were reviewed today include:   Coronary CTA 11/16 IMPRESSION: 1. Coronary calcium score of 8. This was 80 percentile for age and sex matched control. 2. Normal coronary origin and right dominance. 3. Mild non-obstructive plaque in proximal and mid LAD. 4. Pulmonary veins drain normally into the left atrium. There are two on the right and two on the left with no evidence of stenosis. 5. There is a large left atrial appendage - broccoli type with ostial measurements 24.2 x 21.5 mm and depth of 31.7 mm. There is no filling defect in the left atrium or left atrial appendage. 6. Esophagus runs in the centerline behind the left atrium and is not in the proximity to any of the pulmonary veins.  PVI Ablation 11/16 CONCLUSIONS: 1. Sinus rhythm upon presentation.  2. Successful electrical isolation and anatomical encircling of all four pulmonary veins with radiofrequency current.  3. Cavo-tricuspid isthmus ablation was performed with complete bidirectional isthmus block achieved.  4. No early apparent complications.  ETT 6/16 Inadequate HR  There was no ST segment deviation noted during stress.  No T wave inversion was noted during stress.  Echo  8/15 Mild concentric LVH, EF 55-60%, normal wall  motion  Carotid US 10/14 Bilat ICA 1-39% Fu 2 years  ASSESSMENT:     1. Palpitations   2. Paroxysmal atrial fibrillation (HCC)   3. Essential hypertension   4. Hyperlipidemia   5. Coronary artery calcification seen on CAT scan     PLAN:     In order of problems listed above:  1. Palpitations - This was assoc with some discomfort in her chest.  She denies exertional chest pain. CTA in 11/16 did not demonstrate sig CAD.  She is 4 mos out from her ablation. She is now off Amiodarone.  No AFib documented in the ED.  Overall, I suspect she was experiencing PACs/PVCs.  At this point, I do not think she requires stress testing. She avoids caffeine.  Given her hx of atrial arrhythmias, will need to exclude recurrent AFib/Flutter.  -  Echocardiogram  -  Event monitor x 30 days  -  TSH, Mg2+ today  -  FU with Dr. Allegra Lai in 6 weeks.  -  Bisoprolol 2.5 mg prn palpitations  2. PAF - s/p PVI ablation.  CHADS2-VASc=4. Continue long term anticoag.  Recent Hgb, Creatinine ok.  3. HTN - Controlled.   4. HL - Continue statin.  5. CAD - Mild LAD plaque noted on Coronary CTA in 11/16.  No exertional chest pain.  Consider Myoview if she has recurrent chest symptoms. Not on ASA as she is on Eliquis. Continue statin.     Medication Adjustments/Labs and Tests Ordered: Current medicines are reviewed at length with the patient today.  Concerns regarding medicines are outlined above.  Medication changes, Labs and Tests ordered today are outlined in the Patient Instructions noted below. Patient Instructions  Medication Instructions:  1. OK TO TAKE EXTRA 2.5 MG BISOPROLOL AS NEEDED OR PALPITATIONS  Labwork: TODAY MAGNESIUM LEVEL AND TSH  Testing/Procedures: Your physician has requested that you have an echocardiogram. Echocardiography is a painless test that uses sound waves to create images of your heart. It provides your doctor with information about the size and shape of your heart and how  well your heart's chambers and valves are working. This procedure takes approximately one hour. There are no restrictions for this procedure.  Your physician has recommended that you wear an event monitor. Event monitors are medical devices that record the heart's electrical activity. Doctors most often [place] these monitors to diagnose arrhythmias. Arrhythmias are problems with the speed or rhythm of the heartbeat. The monitor is a small, portable device. You can wear one while you do your normal daily activities. This is usually used to diagnose what is causing palpitations/syncope (passing out).  Follow-Up: DR. Curt Bears IN 6 WEEKS  Any Other Special Instructions Will Be Listed Below (If Applicable).  If you need a refill on your cardiac medications before your next appointment, please call your pharmacy.   Signed, Richardson Dopp, PA-C  12/25/2015 5:08 PM    [facility] [address], [place], [place]  [address] Phone: [phone]; Fax: [phone]

## 2015-12-25 ENCOUNTER — Encounter: Payer: Self-pay | Admitting: Physician Assistant

## 2015-12-25 ENCOUNTER — Ambulatory Visit (INDEPENDENT_AMBULATORY_CARE_PROVIDER_SITE_OTHER): Payer: Medicare Other | Admitting: Physician Assistant

## 2015-12-25 ENCOUNTER — Telehealth: Payer: Self-pay | Admitting: *Deleted

## 2015-12-25 VITALS — BP 128/62 | HR 64 | Ht 63.5 in | Wt 145.2 lb

## 2015-12-25 DIAGNOSIS — I2510 Atherosclerotic heart disease of native coronary artery without angina pectoris: Secondary | ICD-10-CM

## 2015-12-25 DIAGNOSIS — I480 Paroxysmal atrial fibrillation: Secondary | ICD-10-CM

## 2015-12-25 DIAGNOSIS — R002 Palpitations: Secondary | ICD-10-CM | POA: Diagnosis not present

## 2015-12-25 DIAGNOSIS — E785 Hyperlipidemia, unspecified: Secondary | ICD-10-CM

## 2015-12-25 DIAGNOSIS — I10 Essential (primary) hypertension: Secondary | ICD-10-CM

## 2015-12-25 LAB — TSH: TSH: 6.41 mIU/L — ABNORMAL HIGH

## 2015-12-25 LAB — MAGNESIUM: Magnesium: 1.9 mg/dL (ref 1.5–2.5)

## 2015-12-25 NOTE — Patient Instructions (Addendum)
Medication Instructions:  1. OK TO TAKE EXTRA 2.5 MG BISOPROLOL AS NEEDED OR PALPITATIONS  Labwork: TODAY MAGNESIUM LEVEL AND TSH  Testing/Procedures: Your physician has requested that you have an echocardiogram. Echocardiography is a painless test that uses sound waves to create images of your heart. It provides your doctor with information about the size and shape of your heart and how well your heart's chambers and valves are working. This procedure takes approximately one hour. There are no restrictions for this procedure.  Your physician has recommended that you wear an event monitor. Event monitors are medical devices that record the heart's electrical activity. Doctors most often [place] these monitors to diagnose arrhythmias. Arrhythmias are problems with the speed or rhythm of the heartbeat. The monitor is a small, portable device. You can wear one while you do your normal daily activities. This is usually used to diagnose what is causing palpitations/syncope (passing out).  Follow-Up: DR. Curt Bears IN 6 WEEKS  Any Other Special Instructions Will Be Listed Below (If Applicable).  If you need a refill on your cardiac medications before your next appointment, please call your pharmacy.

## 2015-12-25 NOTE — Telephone Encounter (Signed)
Pt notified of lab results and findings by phone with verbal understanding. Pt confirms not taking /amiodarone, med list corrected. Pt advised to f/u w/PCP about elevated TSH. Labs faxed to PCP

## 2015-12-25 NOTE — Addendum Note (Signed)
Addended by: Michae Kava on: 12/25/2015 06:27 PM   Modules accepted: Orders, Medications

## 2015-12-27 ENCOUNTER — Ambulatory Visit (INDEPENDENT_AMBULATORY_CARE_PROVIDER_SITE_OTHER): Payer: Medicare Other

## 2015-12-27 DIAGNOSIS — R002 Palpitations: Secondary | ICD-10-CM | POA: Diagnosis not present

## 2016-01-10 ENCOUNTER — Other Ambulatory Visit: Payer: Self-pay

## 2016-01-10 ENCOUNTER — Ambulatory Visit (HOSPITAL_COMMUNITY): Payer: Medicare Other | Attending: Cardiology

## 2016-01-10 ENCOUNTER — Encounter: Payer: Self-pay | Admitting: Physician Assistant

## 2016-01-10 DIAGNOSIS — I5189 Other ill-defined heart diseases: Secondary | ICD-10-CM | POA: Diagnosis not present

## 2016-01-10 DIAGNOSIS — I340 Nonrheumatic mitral (valve) insufficiency: Secondary | ICD-10-CM | POA: Diagnosis not present

## 2016-01-10 DIAGNOSIS — I071 Rheumatic tricuspid insufficiency: Secondary | ICD-10-CM | POA: Diagnosis not present

## 2016-01-10 DIAGNOSIS — R002 Palpitations: Secondary | ICD-10-CM | POA: Diagnosis not present

## 2016-01-10 DIAGNOSIS — I517 Cardiomegaly: Secondary | ICD-10-CM | POA: Insufficient documentation

## 2016-01-11 ENCOUNTER — Telehealth: Payer: Self-pay | Admitting: *Deleted

## 2016-01-11 NOTE — Telephone Encounter (Signed)
Pt has been notified of echo results by phone with verbal understanding 

## 2016-02-04 NOTE — Progress Notes (Signed)
Electrophysiology Office Note   Date:  02/05/2016   ID:  Chelsea ALDERFER, DOB 02/15/1934, MRN [id number]  PCP:  [name], MD  Cardiologist:  [name] Primary Electrophysiologist:  Ethelmae Ringel Meredith Leeds, MD    Chief Complaint  Patient presents with  . Follow-up    event monitor     History of Present Illness: Chelsea Hancock is a 82 y.o. female who presents today for electrophysiology evaluation.   She had an ablation for atrial fib/flutter on 11/18. She started on amiodarone at that time.  She has been having palpitations and was found to have atrial fibrillation on a 30 day monitor early in the morning.  On the monitor, it showed that she had short episodes of atrial fibrillation early in the morning on April 23. Since that time, she has had a few more episodes of palpitations. She has been taking her metoprolol which has been helping.   Today, she denies symptoms of palpitations, chest pain, shortness of breath, orthopnea, PND, lower extremity edema, claudication, dizziness, presyncope, syncope, bleeding, or neurologic sequela. The patient is tolerating medications without difficulties and is otherwise without complaint today.    Past Medical History  Diagnosis Date  . Hyperlipidemia     takes Zetia and Simvastatin nightly  . Palpitation   . Carotid bruit     Right  . Edema   . Arthritis   . Allergy     portabella mushrooms/diarrhea  . Heart murmur   . Osteoporosis   . Fibromyalgia   . Thrush 03/23/2012  . Vaginitis 03/23/2012  . Sigmoid colon ulcer 03/23/2012  . Hyperglycemia 04/27/2012  . History of migraine     last about 34yrs ago  . Foot drop, right   . Joint pain   . Joint swelling   . Chronic back pain   . Skin problem in pregnancy     takes gabapentin 3 times a day and also has an ointment  . GERD (gastroesophageal reflux disease)     takes Zantac and Nexium daily  . Hemorrhoids   . Diverticulosis   . IBS (irritable bowel syndrome)       takes align daily  . Hypocalcemia 03/29/2013  . Depression with anxiety 03/29/2013    hx of  . Tachycardia 07/03/2013  . Onychomycosis 07/03/2013  . Adenomatous polyp of colon 1991, 2008  . Medicare annual wellness visit, subsequent 08/13/2014  . Dysrhythmia     paroxysmal A fib, irregular heartbeat;takes Bisoprolol nightly  . Migraine 04/27/2012    last 40 years ago  . Falls frequently   . Memory loss 02/11/2015  . Atrial fibrillation and flutter ([place])   . History of echocardiogram     Echo 4/17 - mild focal basal septal hypertrophy, EF 60-65%, no RWMA, Gr 2 DD, mild LAE, mild TR   Past Surgical History  Procedure Laterality Date  . Abdominal hysterectomy  1981  . Lumbar laminectomy  1960    x 2  . Veins stripped  1970  . Total hip arthroplasty Left 2012  . Carpal tunnel release Left 2012    ulnar nerve release at elbow  . Cervical fusion  2003, 2005, 2013  . Tonsillectomy  1943  . Left elbow surgery  06/03/11    cyst removed  . Colonoscopy    . Posterior cervical fusion/foraminotomy  08/03/2012    Procedure: POSTERIOR CERVICAL FUSION/FORAMINOTOMY LEVEL 5;  Surgeon: [name], MD;  Location: [facility];  Service: Neurosurgery;  Laterality: N/A;  Cervical four to Thoracic two Posterior cervical decompression with Facet and Pedicle screw fixation  . Back surgery  1997    reconstructive lower back surgery  . Wrist surgery Right 1990's    cyst removed  . Eye surgery  2015    b/l cataracts removed  . Total knee arthroplasty Right 11/20/2014    Procedure: RIGHT TOTAL KNEE ARTHROPLASTY;  Surgeon: [name], MD;  Location: [facility];  Service: Orthopedics;  Laterality: Right;  . Electrophysiologic study N/A 08/10/2015    Procedure: Afib;  Surgeon: Kaemon Barnett Meredith Leeds, MD;  Location: [facility];  Service: Cardiovascular;  Laterality: N/A;     Current Outpatient Prescriptions  Medication Sig Dispense Refill  . apixaban (ELIQUIS) 5 MG TABS tablet Take 1 tablet (5 mg  total) by mouth 2 (two) times daily. 60 tablet 5  . atorvastatin (LIPITOR) 10 MG tablet Take 10 mg by mouth daily.    . Biotin 5000 MCG TABS Take 5,000 mcg by mouth daily.    . bisoprolol (ZEBETA) 5 MG tablet Take 5 mg by mouth daily. Ok to take extra 2.5 mg as needed for palpitations    . Calcium Carb-Cholecalciferol 600-200 MG-UNIT TABS Take 1 tablet by mouth daily.    Marland Kitchen diltiazem (CARDIZEM CD) 120 MG 24 hr capsule Take 1 capsule (120 mg total) by mouth daily. May take additional dose daily if has palpitation 30 capsule 5  . esomeprazole (NEXIUM) 40 MG capsule Take 1 capsule (40 mg total) by mouth daily at 12 noon. 90 capsule 3  . eszopiclone (LUNESTA) 2 MG TABS tablet Take 1 tablet (2 mg total) by mouth at bedtime as needed for sleep. Take immediately before bedtime 30 tablet 2  . gabapentin (NEURONTIN) 600 MG tablet TAKE 1 TABLET(600 MG) BY MOUTH TWICE DAILY 180 tablet 0  . MEGARED OMEGA-3 KRILL OIL 500 MG CAPS Take 1 tablet by mouth daily.    . Melatonin 5 MG CAPS Take 5 mg by mouth at bedtime.     . Multiple Vitamins-Minerals (CENTRUM SILVER ULTRA WOMENS) TABS Take 1 tablet by mouth daily.    . nortriptyline (PAMELOR) 25 MG capsule TAKE 2 CAPSULES BY MOUTH EVERY NIGHT AT BEDTIME 180 capsule 0  . ZETIA 10 MG tablet TAKE 1 TABLET BY MOUTH EVERY DAY 30 tablet 6   No current facility-administered medications for this visit.    Allergies:   Cefuroxime axetil; Macrodantin; Alendronate sodium; Codeine; Demerol; Meperidine hcl; Other; and Sulfonamide derivatives   Social History:  The patient  reports that she has quit smoking. Her smoking use included Cigarettes. She has a 1 pack-year smoking history. She has never used smokeless tobacco. She reports that she drinks alcohol. She reports that she does not use illicit drugs.   Family History:  The patient's family history includes Anxiety disorder in her mother; Bipolar disorder in her mother; Colon cancer in her father; HIV in her son; Heart  disease in her maternal grandmother; Mental illness in her mother; Stomach cancer in her maternal grandfather, mother, and paternal grandmother; Stroke in her father; Throat cancer in her maternal uncle. There is no history of Anesthesia problems or Diabetes.    ROS:  Please see the history of present illness.   All other systems are reviewed and negative.    PHYSICAL EXAM: VS:  BP 120/66 mmHg  Pulse 58  Ht 5\' 3"  (1.6 m)  Wt 146 lb 9.6 oz (66.497 kg)  BMI 25.98 kg/m2 , BMI Body mass  index is 25.98 kg/(m^2). GEN: Well nourished, well developed, in no acute distress HEENT: normal Neck: no JVD, carotid bruits, or masses Cardiac: RRR; no murmurs, rubs, or gallops,no edema  Respiratory:  clear to auscultation bilaterally, normal work of breathing GI: soft, nontender, nondistended, + BS MS: no deformity or atrophy Skin: warm and dry Neuro:  Strength and sensation are intact Psych: euthymic mood, full affect  EKG:  EKG is not ordered today.   Recent Labs: 02/20/2015: B Natriuretic Peptide 125.1* 12/23/2015: BUN 22*; Creatinine, Ser 0.83; Hemoglobin 13.8; Platelets 216; Potassium 3.6; Sodium 138 12/25/2015: Magnesium 1.9; TSH 6.41*    Lipid Panel     Component Value Date/Time   CHOL 187 08/03/2014 1053   TRIG 150.0* 08/03/2014 1053   TRIG 133 06/30/2006 1110   HDL 45.80 08/03/2014 1053   CHOLHDL 4 08/03/2014 1053   CHOLHDL 3.1 CALC 06/30/2006 1110   VLDL 30.0 08/03/2014 1053   LDLCALC 111* 08/03/2014 1053   LDLDIRECT 93.3 08/04/2008 0915     Wt Readings from Last 3 Encounters:  02/05/16 146 lb 9.6 oz (66.497 kg)  12/25/15 145 lb 3.2 oz (65.862 kg)  12/23/15 144 lb 9 oz (65.573 kg)      Other studies Reviewed: Additional studies/ records that were reviewed today include: TTE 2015  Review of the above records today demonstrates: - Left ventricle: The cavity size was normal. There was mild concentric hypertrophy. Systolic function was normal. The estimated ejection  fraction was in the range of 55% to 60%. Wall motion was normal; there were no regional wall motion abnormalities. The study is not technically sufficient to allow evaluation of LV diastolic function. - Atrial septum: No defect or patent foramen ovale was identified.   ASSESSMENT AND PLAN:  1.  Atrial fibrillation: S/P ablation and put on amiodarone post ablation. On Eliqius 5 mg BID.  She is tolerating the Eliquis well, she has a CHADS2VASC score of 3.  She did have some breakthrough atrial fibrillation on her monitor early in the morning of April 23. She has also continued to have some mild palpitations that are short lived. Due to that, we'll start her on twice a day flecainide at 50 mg.   This patients CHA2DS2-VASc Score and unadjusted Ischemic Stroke Rate (% per year) is equal to 3.2 % stroke rate/year from a score of 3  Above score calculated as 1 point each if present [CHF, HTN, DM, Vascular=MI/PAD/Aortic Plaque, Age if 65-74, or Female] Above score calculated as 2 points each if present [Age > 75, or Stroke/TIA/TE]  Current medicines are reviewed at length with the patient today.   The patient does not have concerns regarding her medicines.  The following changes were made today:  Flecainide 50 mg BID  Labs/ tests ordered today include:  No orders of the defined types were placed in this encounter.     Disposition:   FU with Joelle Flessner 3  months  Signed, Rubby Barbary Meredith Leeds, MD  02/05/2016 11:49 AM     [facility] [address] [place] [address] [phone] (office) [phone] (fax)

## 2016-02-05 ENCOUNTER — Encounter: Payer: Self-pay | Admitting: Cardiology

## 2016-02-05 ENCOUNTER — Ambulatory Visit (INDEPENDENT_AMBULATORY_CARE_PROVIDER_SITE_OTHER): Payer: Medicare Other | Admitting: Cardiology

## 2016-02-05 VITALS — BP 120/66 | HR 58 | Ht 63.0 in | Wt 146.6 lb

## 2016-02-05 DIAGNOSIS — Z79899 Other long term (current) drug therapy: Secondary | ICD-10-CM | POA: Diagnosis not present

## 2016-02-05 DIAGNOSIS — I480 Paroxysmal atrial fibrillation: Secondary | ICD-10-CM

## 2016-02-05 DIAGNOSIS — R002 Palpitations: Secondary | ICD-10-CM | POA: Diagnosis not present

## 2016-02-05 DIAGNOSIS — I2510 Atherosclerotic heart disease of native coronary artery without angina pectoris: Secondary | ICD-10-CM | POA: Diagnosis not present

## 2016-02-05 MED ORDER — FLECAINIDE ACETATE 50 MG PO TABS: 50.0000 mg | ORAL_TABLET | Freq: Two times a day (BID) | ORAL | Status: DC

## 2016-02-05 NOTE — Patient Instructions (Addendum)
Medication Instructions:  Your physician has recommended you make the following change in your medication:  1) START Flecainide  50 mg twice a day  If you need a refill on your cardiac medications before your next appointment, please call your pharmacy.  Labwork: None ordered  Testing/Procedures: None ordered  Follow-Up: Your physician recommends that you schedule a follow-up appointment in: next week for an EKG nurse visit.  Your physician recommends that you schedule a follow-up appointment in: 3 months with Dr. Curt Bears.   Thank you for choosing [facility]!!   Trinidad Curet, RN [phone]

## 2016-02-19 ENCOUNTER — Telehealth: Payer: Self-pay | Admitting: Cardiology

## 2016-02-19 ENCOUNTER — Ambulatory Visit (INDEPENDENT_AMBULATORY_CARE_PROVIDER_SITE_OTHER): Payer: Medicare Other

## 2016-02-19 DIAGNOSIS — Z79899 Other long term (current) drug therapy: Secondary | ICD-10-CM

## 2016-02-19 NOTE — Progress Notes (Signed)
1.) Reason for visit: EKG  2.) Name of MD requesting visit: Dr. Curt Bears  3.) H&P: Patient has history of A. Fib/flutter with Ablation on 11/18, palpitations, carotid bruit, hyperlipidemia, edema, heart murmur, fibromyalgia, osteoporosis, hyperglycemia, GERD, Hypocalcemia, depression, anxiety, tachycardia, and IBS. Patient denies any SOB, chest pain, or palpitations. Patient's vital signs are BP 107/65, HR 56, Resp 18, and O2 sat at 93% on room air.   4.) ROS related to problem: Patient is following up for an EKG after starting Flecainide 50 mg by mouth twice daily on 02/05/16. Patient tolerated EKG. Patient is tolerating flecainide and had no complaints.   5.) Assessment and plan per MD: Dr. Curt Bears reviewed EKG and vital signs. No changes at this time. Patient has follow-up office visit with Dr. Curt Bears on 05/08/16.

## 2016-02-19 NOTE — Telephone Encounter (Signed)
Rescheduled EKG for today at 2pm

## 2016-02-19 NOTE — Patient Instructions (Signed)
NO CHANGES

## 2016-02-19 NOTE — Telephone Encounter (Signed)
New message   Pt is calling to reschedule her nurse EKG appt that she missed 02-14-16

## 2016-02-22 ENCOUNTER — Other Ambulatory Visit: Payer: Self-pay | Admitting: Surgery

## 2016-02-22 DIAGNOSIS — E042 Nontoxic multinodular goiter: Secondary | ICD-10-CM

## 2016-02-25 ENCOUNTER — Encounter: Payer: Self-pay | Admitting: Nurse Practitioner

## 2016-02-26 DIAGNOSIS — E042 Nontoxic multinodular goiter: Secondary | ICD-10-CM | POA: Diagnosis not present

## 2016-02-29 ENCOUNTER — Ambulatory Visit
Admission: RE | Admit: 2016-02-29 | Discharge: 2016-02-29 | Disposition: A | Discharge status: Home / Self Care | Payer: Medicare Other | Source: Ambulatory Visit | Attending: Surgery | Admitting: Surgery

## 2016-02-29 ENCOUNTER — Other Ambulatory Visit: Payer: Medicare Other

## 2016-02-29 DIAGNOSIS — E042 Nontoxic multinodular goiter: Secondary | ICD-10-CM | POA: Diagnosis not present

## 2016-03-01 DIAGNOSIS — N390 Urinary tract infection, site not specified: Secondary | ICD-10-CM | POA: Diagnosis not present

## 2016-03-01 DIAGNOSIS — R300 Dysuria: Secondary | ICD-10-CM | POA: Diagnosis not present

## 2016-03-07 DIAGNOSIS — Z1231 Encounter for screening mammogram for malignant neoplasm of breast: Secondary | ICD-10-CM | POA: Diagnosis not present

## 2016-03-13 DIAGNOSIS — Z23 Encounter for immunization: Secondary | ICD-10-CM | POA: Diagnosis not present

## 2016-03-17 DIAGNOSIS — N3090 Cystitis, unspecified without hematuria: Secondary | ICD-10-CM | POA: Diagnosis not present

## 2016-03-18 DIAGNOSIS — M810 Age-related osteoporosis without current pathological fracture: Secondary | ICD-10-CM | POA: Diagnosis not present

## 2016-03-27 ENCOUNTER — Other Ambulatory Visit: Payer: Self-pay | Admitting: Cardiology

## 2016-04-18 IMAGING — CR DG CHEST 1V PORT
1 series · 1 of 1 positions shown · non-contrast
Comparison: 01/21/2015

CLINICAL DATA: Heart palpitations 1 day.

EXAM:
PORTABLE CHEST - 1 VIEW

[AP]
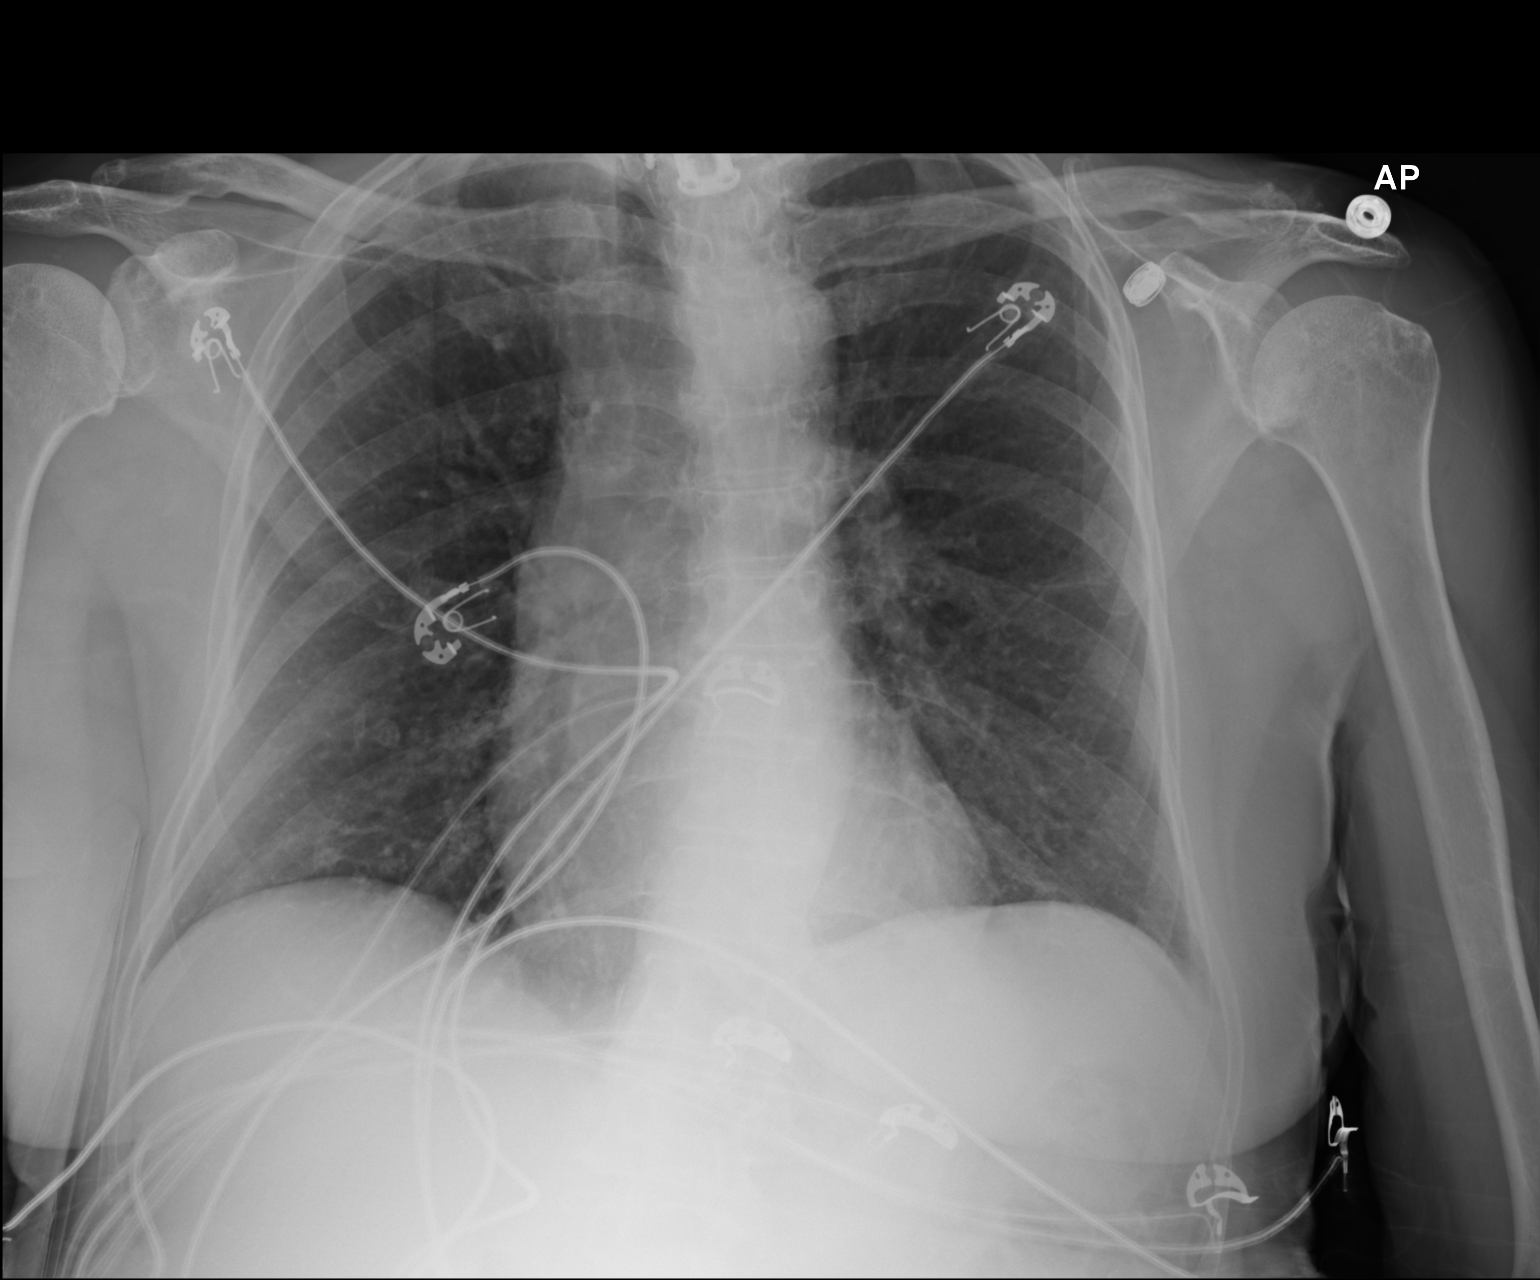

[1 of 1 positions shown; findings below may reference images not displayed]

FINDINGS: Lungs are adequately inflated without consolidation or effusion.
Cardiomediastinal silhouette is within normal. There are
degenerative changes of the spine. Fusion hardware over the cervical
thoracic junction unchanged.
IMPRESSION: No active disease.

## 2016-05-07 NOTE — Progress Notes (Signed)
Electrophysiology Office Note   Date:  05/08/2016   ID:  Chelsea Hancock, DOB [birth year], MRN [id number]  PCP:  [name], MD  Cardiologist:  [name] Primary Electrophysiologist:  Urie Loughner Meredith Leeds, MD    Chief Complaint  Patient presents with  . Follow-up    3 months      History of Present Illness: Chelsea Hancock is a 82 y.o. female who presents today for electrophysiology evaluation.   She had an ablation for atrial fib/flutter on 11/18. She started on amiodarone at that time.  She has been having palpitations and was found to have atrial fibrillation on a 30 day monitor early in the morning.  On the monitor, it showed that she had short episodes of atrial fibrillation early in the morning on April 23. Since that time, she has had a few more episodes of palpitations. Her amiodarone was stopped and she was started on flecainide.   Today, she denies symptoms of palpitations, chest pain, shortness of breath, orthopnea, PND, lower extremity edema, claudication, dizziness, presyncope, syncope, bleeding, or neurologic sequela. The patient is tolerating medications without difficulties and is otherwise without complaint today. She has not had any further episodes of atrial fibrillation that she can tell.  She is tolerating her flecainide and Eliquis without issues.   Past Medical History:  Diagnosis Date  . Adenomatous polyp of colon 1991, 2008  . Arthritis   . Atrial fibrillation and flutter ([place])   . Carotid bruit    Right  . Chronic back pain   . Depression with anxiety 03/29/2013  . Diverticulosis   . Falls frequently   . Fibromyalgia   . Foot drop, right   . GERD (gastroesophageal reflux disease)   . Hemorrhoids   . Hyperlipidemia   . IBS (irritable bowel syndrome)   . Memory loss 02/11/2015  . Onychomycosis 07/03/2013  . Osteoporosis   . Sigmoid colon ulcer 03/23/2012  . Skin problem in pregnancy    takes gabapentin 3 times a day and also  has an ointment   Past Surgical History:  Procedure Laterality Date  . ABDOMINAL HYSTERECTOMY  1981  . BACK SURGERY  1997   reconstructive lower back surgery  . CARPAL TUNNEL RELEASE Left 2012   ulnar nerve release at elbow  . CERVICAL FUSION  2003, 2005, 2013  . COLONOSCOPY    . ELECTROPHYSIOLOGIC STUDY N/A 08/10/2015   Procedure: Afib;  Surgeon:  Paulsen Meredith Leeds, MD;  Location: [facility];  Service: Cardiovascular;  Laterality: N/A;  . EYE SURGERY  2015   b/l cataracts removed  . left elbow surgery  06/03/11   cyst removed  . LUMBAR LAMINECTOMY  1960   x 2  . POSTERIOR CERVICAL FUSION/FORAMINOTOMY  08/03/2012   Procedure: POSTERIOR CERVICAL FUSION/FORAMINOTOMY LEVEL 5;  Surgeon: [name], MD;  Location: [place] [facility];  Service: Neurosurgery;  Laterality: N/A;  Cervical four to Thoracic two Posterior cervical decompression with Facet and Pedicle screw fixation  . TONSILLECTOMY  1943  . TOTAL HIP ARTHROPLASTY Left 2012  . TOTAL KNEE ARTHROPLASTY Right 11/20/2014   Procedure: RIGHT TOTAL KNEE ARTHROPLASTY;  Surgeon: [name], MD;  Location: [facility];  Service: Orthopedics;  Laterality: Right;  . veins stripped  1970  . WRIST SURGERY Right 1990's   cyst removed     Current Outpatient Prescriptions  Medication Sig Dispense Refill  . atorvastatin (LIPITOR) 10 MG tablet Take 10 mg by mouth daily.    . Biotin  5000 MCG TABS Take 5,000 mcg by mouth daily.    . bisoprolol (ZEBETA) 5 MG tablet Take 5 mg by mouth daily. Ok to take extra 2.5 mg as needed for palpitations    . Calcium Carb-Cholecalciferol 600-200 MG-UNIT TABS Take 1 tablet by mouth daily.    Marland Kitchen diltiazem (CARDIZEM CD) 120 MG 24 hr capsule Take 1 capsule (120 mg total) by mouth daily. May take additional dose daily if has palpitation 30 capsule 5  . ELIQUIS 5 MG TABS tablet TAKE 1 TABLET(5 MG) BY MOUTH TWICE DAILY 60 tablet 11  . esomeprazole (NEXIUM) 40 MG capsule Take 1 capsule (40 mg total) by mouth daily  at 12 noon. 90 capsule 3  . eszopiclone (LUNESTA) 2 MG TABS tablet Take 1 tablet (2 mg total) by mouth at bedtime as needed for sleep. Take immediately before bedtime 30 tablet 2  . flecainide (TAMBOCOR) 50 MG tablet Take 1 tablet (50 mg total) by mouth 2 (two) times daily. 60 tablet 3  . gabapentin (NEURONTIN) 600 MG tablet TAKE 1 TABLET(600 MG) BY MOUTH TWICE DAILY 180 tablet 0  . MEGARED OMEGA-3 KRILL OIL 500 MG CAPS Take 1 tablet by mouth daily.    . Melatonin 5 MG CAPS Take 5 mg by mouth at bedtime.     . Multiple Vitamins-Minerals (CENTRUM SILVER ULTRA WOMENS) TABS Take 1 tablet by mouth daily.    . nortriptyline (PAMELOR) 25 MG capsule TAKE 2 CAPSULES BY MOUTH EVERY NIGHT AT BEDTIME 180 capsule 0  . ZETIA 10 MG tablet TAKE 1 TABLET BY MOUTH EVERY DAY 30 tablet 6   No current facility-administered medications for this visit.     Allergies:   Cefuroxime axetil; Macrodantin; Alendronate sodium; Codeine; Demerol [meperidine]; Meperidine hcl; Other; and Sulfonamide derivatives   Social History:  The patient  reports that she has quit smoking. Her smoking use included Cigarettes. She has a 1.00 pack-year smoking history. She has never used smokeless tobacco. She reports that she drinks alcohol. She reports that she does not use drugs.   Family History:  The patient's family history includes Anxiety disorder in her mother; Bipolar disorder in her mother; Colon cancer in her father; HIV in her son; Heart disease in her maternal grandmother; Mental illness in her mother; Stomach cancer in her maternal grandfather, mother, and paternal grandmother; Stroke in her father; Throat cancer in her maternal uncle.    ROS:  Please see the history of present illness.   All other systems are reviewed and positive for none.    PHYSICAL EXAM: VS:  BP 126/68   Pulse 64   Ht 5\' 3"  (1.6 m)   Wt 146 lb (66.2 kg)   BMI 25.86 kg/m  , BMI Body mass index is 25.86 kg/m. GEN: Well nourished, well developed, in  no acute distress  HEENT: normal  Neck: no JVD, carotid bruits, or masses Cardiac: RRR; no murmurs, rubs, or gallops,no edema  Respiratory:  clear to auscultation bilaterally, normal work of breathing GI: soft, nontender, nondistended, + BS MS: no deformity or atrophy  Skin: warm and dry Neuro:  Strength and sensation are intact Psych: euthymic mood, full affect  EKG:  EKG is ordered today. Personal review of the ECG shows sinus rhythm, rate 64, 1 degree AV block, APCs  Recent Labs: 12/23/2015: BUN 22; Creatinine, Ser 0.83; Hemoglobin 13.8; Platelets 216; Potassium 3.6; Sodium 138 12/25/2015: Magnesium 1.9; TSH 6.41    Lipid Panel     Component Value Date/Time  CHOL 187 08/03/2014 1053   TRIG 150.0 (H) 08/03/2014 1053   TRIG 133 06/30/2006 1110   HDL 45.80 08/03/2014 1053   CHOLHDL 4 08/03/2014 1053   VLDL 30.0 08/03/2014 1053   LDLCALC 111 (H) 08/03/2014 1053   LDLDIRECT 93.3 08/04/2008 0915     Wt Readings from Last 3 Encounters:  05/08/16 146 lb (66.2 kg)  02/19/16 146 lb (66.2 kg)  02/05/16 146 lb 9.6 oz (66.5 kg)      Other studies Reviewed: Additional studies/ records that were reviewed today include: TTE 2015  Review of the above records today demonstrates: - Left ventricle: The cavity size was normal. There was mild concentric hypertrophy. Systolic function was normal. The estimated ejection fraction was in the range of 55% to 60%. Wall motion was normal; there were no regional wall motion abnormalities. The study is not technically sufficient to allow evaluation of LV diastolic function. - Atrial septum: No defect or patent foramen ovale was identified.   ASSESSMENT AND PLAN:  1.  Atrial fibrillation: S/P ablation and put on amiodarone post ablation. On Eliqius 5 mg BID.  She is tolerating the Eliquis well, she has a CHADS2VASC score of 3.  Was put on both amiodarone and flecainide post ablation as she has had breakthrough AF.  Tolerating her  flecainide well without issues and Sarissa Dern continue.  No further AF noted.  This patients CHA2DS2-VASc Score and unadjusted Ischemic Stroke Rate (% per year) is equal to 3.2 % stroke rate/year from a score of 3  Above score calculated as 1 point each if present [CHF, HTN, DM, Vascular=MI/PAD/Aortic Plaque, Age if 65-74, or Female] Above score calculated as 2 points each if present [Age > 75, or Stroke/TIA/TE]  Current medicines are reviewed at length with the patient today.   The patient does not have concerns regarding her medicines.  The following changes were made today:  none  Labs/ tests ordered today include:  No orders of the defined types were placed in this encounter.    Disposition:   FU with Athalia Setterlund 1  year  Signed, Ohm Dentler Meredith Leeds, MD  05/08/2016 9:49 AM     [facility] [address] [place] [address] [phone] (office) [phone] (fax)

## 2016-05-08 ENCOUNTER — Encounter: Payer: Self-pay | Admitting: Cardiology

## 2016-05-08 ENCOUNTER — Ambulatory Visit (INDEPENDENT_AMBULATORY_CARE_PROVIDER_SITE_OTHER): Payer: Medicare Other | Admitting: Cardiology

## 2016-05-08 VITALS — BP 126/68 | HR 64 | Ht 63.0 in | Wt 146.0 lb

## 2016-05-08 DIAGNOSIS — I480 Paroxysmal atrial fibrillation: Secondary | ICD-10-CM | POA: Diagnosis not present

## 2016-05-08 DIAGNOSIS — I2510 Atherosclerotic heart disease of native coronary artery without angina pectoris: Secondary | ICD-10-CM

## 2016-05-08 NOTE — Patient Instructions (Signed)
Medication Instructions:  Your physician recommends that you continue on your current medications as directed. Please refer to the Current Medication list given to you today.   Labwork: None ordered   Testing/Procedures: None ordered   Follow-Up: Your physician wants you to follow-up in: 12 months with Dr Cora Daniels will receive a reminder letter in the mail two months in advance. If you don't receive a letter, please call our office to schedule the follow-up appointment.   Any Other Special Instructions Will Be Listed Below (If Applicable).     If you need a refill on your cardiac medications before your next appointment, please call your pharmacy.

## 2016-05-21 DIAGNOSIS — S0100XA Unspecified open wound of scalp, initial encounter: Secondary | ICD-10-CM | POA: Diagnosis not present

## 2016-05-29 ENCOUNTER — Other Ambulatory Visit: Payer: Self-pay | Admitting: Cardiology

## 2016-06-07 DIAGNOSIS — N390 Urinary tract infection, site not specified: Secondary | ICD-10-CM | POA: Diagnosis not present

## 2016-06-07 DIAGNOSIS — A499 Bacterial infection, unspecified: Secondary | ICD-10-CM | POA: Diagnosis not present

## 2016-06-14 ENCOUNTER — Telehealth: Payer: Self-pay | Admitting: Student

## 2016-06-14 NOTE — Telephone Encounter (Signed)
  Received a call from the patient that she has an irregular HR and thinks she is in atrial fibrillation again. Denies any chest discomfort, palpitations, or dyspnea with exertion.   She checked her pulse while on the phone and it was 86. BP was 129/66. She took all of her morning medications including an extra 2.5mg  Bisoprolol. I advised her to take an extra Cardizem CD 120mg . Reviewed the importance of continuing Eliquis.   If she develops chest discomfort or dyspnea, was educated with proceeding to the ED. With no symptoms at this time, will attempt to manage with medications currently.   Signed, Erma Heritage, PA-C 06/14/2016, 2:33 PM Pager: [phone]

## 2016-06-19 DIAGNOSIS — A499 Bacterial infection, unspecified: Secondary | ICD-10-CM | POA: Diagnosis not present

## 2016-06-19 DIAGNOSIS — N390 Urinary tract infection, site not specified: Secondary | ICD-10-CM | POA: Diagnosis not present

## 2016-06-20 ENCOUNTER — Other Ambulatory Visit: Payer: Self-pay | Admitting: Family Medicine

## 2016-06-23 ENCOUNTER — Telehealth: Payer: Self-pay | Admitting: Cardiology

## 2016-06-23 NOTE — Telephone Encounter (Signed)
New message      Pt states she is in AFIB.  It started last night.  Please advise

## 2016-06-23 NOTE — Telephone Encounter (Signed)
Patient calls back with reports of AFib, rate currently 147 according to patient. BP 141/99 Denies any symptoms at the moment. Reports only feeling rapid HR.   She is aware I will review with AFib clinic to see if we can see her today and call her back.

## 2016-06-23 NOTE — Telephone Encounter (Signed)
Reviewed pt complaint/issue with Chelsea Marshall, NP.  Order to take Flecainide 50 mg x 1 extra dose today.   Appt made w/ Camnitz tomorrow to evaluate pt condition. Patient verbalized understanding and agreeable to plan.

## 2016-06-24 ENCOUNTER — Encounter: Payer: Self-pay | Admitting: Cardiology

## 2016-06-24 ENCOUNTER — Ambulatory Visit (INDEPENDENT_AMBULATORY_CARE_PROVIDER_SITE_OTHER): Payer: Medicare Other | Admitting: Cardiology

## 2016-06-24 ENCOUNTER — Encounter: Payer: Self-pay | Admitting: *Deleted

## 2016-06-24 VITALS — BP 118/66 | HR 132 | Ht 63.0 in | Wt 147.0 lb

## 2016-06-24 DIAGNOSIS — I4819 Other persistent atrial fibrillation: Secondary | ICD-10-CM

## 2016-06-24 DIAGNOSIS — Z79899 Other long term (current) drug therapy: Secondary | ICD-10-CM | POA: Diagnosis not present

## 2016-06-24 DIAGNOSIS — I4892 Unspecified atrial flutter: Secondary | ICD-10-CM

## 2016-06-24 DIAGNOSIS — Z01812 Encounter for preprocedural laboratory examination: Secondary | ICD-10-CM | POA: Diagnosis not present

## 2016-06-24 DIAGNOSIS — I481 Persistent atrial fibrillation: Secondary | ICD-10-CM | POA: Diagnosis not present

## 2016-06-24 DIAGNOSIS — I2510 Atherosclerotic heart disease of native coronary artery without angina pectoris: Secondary | ICD-10-CM | POA: Diagnosis not present

## 2016-06-24 LAB — CBC WITH DIFFERENTIAL/PLATELET
Basophils Absolute: 0 cells/uL (ref 0–200)
Basophils Relative: 0 %
Eosinophils Absolute: 80 cells/uL (ref 15–500)
Eosinophils Relative: 1 %
HCT: 41.2 % (ref 35.0–45.0)
Hemoglobin: 13.5 g/dL (ref 11.7–15.5)
Lymphocytes Relative: 38 %
Lymphs Abs: 3040 cells/uL (ref 850–3900)
MCH: 30.1 pg (ref 27.0–33.0)
MCHC: 32.8 g/dL (ref 32.0–36.0)
MCV: 91.8 fL (ref 80.0–100.0)
MPV: 11.1 fL (ref 7.5–12.5)
Monocytes Absolute: 1120 cells/uL — ABNORMAL HIGH (ref 200–950)
Monocytes Relative: 14 %
Neutro Abs: 3760 cells/uL (ref 1500–7800)
Neutrophils Relative %: 47 %
Platelets: 268 10*3/uL (ref 140–400)
RBC: 4.49 MIL/uL (ref 3.80–5.10)
RDW: 13.8 % (ref 11.0–15.0)
WBC: 8 10*3/uL (ref 3.8–10.8)

## 2016-06-24 LAB — BASIC METABOLIC PANEL
BUN: 27 mg/dL — ABNORMAL HIGH (ref 7–25)
CO2: 24 mmol/L (ref 20–31)
Calcium: 9.5 mg/dL (ref 8.6–10.4)
Chloride: 103 mmol/L (ref 98–110)
Creat: 0.85 mg/dL (ref 0.60–0.93)
Glucose, Bld: 99 mg/dL (ref 65–99)
Potassium: 4.4 mmol/L (ref 3.5–5.3)
Sodium: 138 mmol/L (ref 135–146)

## 2016-06-24 MED ORDER — FLECAINIDE ACETATE 100 MG PO TABS: 100.0000 mg | ORAL_TABLET | Freq: Two times a day (BID) | ORAL | 2 refills | Status: DC

## 2016-06-24 NOTE — Patient Instructions (Signed)
Medication Instructions:  Your physician has recommended you make the following change in your medication:  1) INCREASE Flecainide to 100 mg twice a day  If you need a refill on your cardiac medications before your next appointment, please call your pharmacy.  Labwork: Pre procedure labs today: BMET & CBC w/ diff  Your physician recommends that you return for pre procedure ablation labs between 11/10-11/22   Testing/Procedures: Your physician has recommended that you have a Cardioversion (DCCV). Electrical Cardioversion uses a jolt of electricity to your heart either through paddles or wired patches attached to your chest. This is a controlled, usually prescheduled, procedure. Defibrillation is done under light anesthesia in the hospital, and you usually go home the day of the procedure. This is done to get your heart back into a normal rhythm. You are not awake for the procedure. Please see the instruction sheet given to you today.  Your physician has requested that you have cardiac CT. Cardiac computed tomography (CT) is a painless test that uses an x-ray machine to take clear, detailed pictures of your heart. For further information please visit [facility]. Please follow instruction sheet as given.  Your physician has recommended that you have a repeat AFib ablation. Catheter ablation is a medical procedure used to treat some cardiac arrhythmias (irregular heartbeats). During catheter ablation, a long, thin, flexible tube is put into a blood vessel in your groin (upper thigh), or neck. This tube is called an ablation catheter. It is then guided to your heart through the blood vessel. Radio frequency waves destroy small areas of heart tissue where abnormal heartbeats may cause an arrhythmia to start. Please see the instruction sheet given to you today.   Follow-Up: Your physician recommends that you schedule a follow-up appointment in: 4 weeks, after your procedure on 08/15/16, with  Roderic Palau, NP in the AFib Clinic.  Your physician recommends that you schedule a follow-up appointment in: 3 months, after your procedure on 08/15/16, with Dr. Curt Bears.  Thank you for choosing [facility]!!   Trinidad Curet, RN [phone]   Any Other Special Instructions Will Be Listed Below (If Applicable).

## 2016-06-24 NOTE — Progress Notes (Signed)
Electrophysiology Office Note   Date:  06/24/2016   ID:  Chelsea Hancock, DOB 1934-03-11, MRN [id number]  PCP:  [name], MD  Cardiologist:  [name] Primary Electrophysiologist:  Herold Salguero Meredith Leeds, MD    Chief Complaint  Patient presents with  . Follow-up    afib     History of Present Illness: Chelsea Hancock is a 82 y.o. female who presents today for electrophysiology evaluation.   She had an ablation for atrial fib/flutter on 07/2015. She started on amiodarone at that time.  She has been having palpitations and was found to have atrial fibrillation on a 30 day monitor early in the morning.  Since that time, she has had a few more episodes of palpitations. Her amiodarone was stopped and she was started on flecainide.   Today, she denies symptoms of palpitations, chest pain, shortness of breath, orthopnea, PND, lower extremity edema, claudication, dizziness, presyncope, syncope, bleeding, or neurologic sequela. The patient is tolerating medications without difficulties. She has not had any further episodes of atrial fibrillation that she can tell.  She is tolerating her flecainide and Eliquis without issues. Last Sunday, she went out of rhythm again. She was told to take an x-rayflecainide. She initially complained that she was having palpitations, but now her symptoms are mainly of fatigue and shortness of breath. She was told to take an extra dose of flecainide, and she presents to clinic today in atrial flutter.  Past Medical History:  Diagnosis Date  . Adenomatous polyp of colon 1991, 2008  . Arthritis   . Atrial fibrillation and flutter ([place])   . Carotid bruit    Right  . Chronic back pain   . Depression with anxiety 03/29/2013  . Diverticulosis   . Falls frequently   . Fibromyalgia   . Foot drop, right   . GERD (gastroesophageal reflux disease)   . Hemorrhoids   . Hyperlipidemia   . IBS (irritable bowel syndrome)   . Memory loss 02/11/2015    . Onychomycosis 07/03/2013  . Osteoporosis   . Sigmoid colon ulcer 03/23/2012  . Skin problem in pregnancy    takes gabapentin 3 times a day and also has an ointment   Past Surgical History:  Procedure Laterality Date  . ABDOMINAL HYSTERECTOMY  1981  . BACK SURGERY  1997   reconstructive lower back surgery  . CARPAL TUNNEL RELEASE Left 2012   ulnar nerve release at elbow  . CERVICAL FUSION  2003, 2005, 2013  . COLONOSCOPY    . ELECTROPHYSIOLOGIC STUDY N/A 08/10/2015   Procedure: Afib;  Surgeon: Olita Takeshita Meredith Leeds, MD;  Location: [place] [facility];  Service: Cardiovascular;  Laterality: N/A;  . EYE SURGERY  2015   b/l cataracts removed  . left elbow surgery  06/03/11   cyst removed  . LUMBAR LAMINECTOMY  1960   x 2  . POSTERIOR CERVICAL FUSION/FORAMINOTOMY  08/03/2012   Procedure: POSTERIOR CERVICAL FUSION/FORAMINOTOMY LEVEL 5;  Surgeon: [name], MD;  Location: [place] [facility];  Service: Neurosurgery;  Laterality: N/A;  Cervical four to Thoracic two Posterior cervical decompression with Facet and Pedicle screw fixation  . TONSILLECTOMY  1943  . TOTAL HIP ARTHROPLASTY Left 2012  . TOTAL KNEE ARTHROPLASTY Right 11/20/2014   Procedure: RIGHT TOTAL KNEE ARTHROPLASTY;  Surgeon: [name], MD;  Location: [facility];  Service: Orthopedics;  Laterality: Right;  . veins stripped  1970  . WRIST SURGERY Right 1990's   cyst removed  Current Outpatient Prescriptions  Medication Sig Dispense Refill  . atorvastatin (LIPITOR) 10 MG tablet Take 10 mg by mouth daily.    . Biotin 5000 MCG TABS Take 5,000 mcg by mouth daily.    . bisoprolol (ZEBETA) 5 MG tablet Take 5 mg by mouth daily. Ok to take extra 2.5 mg as needed for palpitations    . Calcium Carb-Cholecalciferol 600-200 MG-UNIT TABS Take 1 tablet by mouth daily.    Marland Kitchen diltiazem (CARDIZEM CD) 120 MG 24 hr capsule Take 1 capsule (120 mg total) by mouth daily. May take additional dose daily if has palpitation 30 capsule 5  . ELIQUIS 5  MG TABS tablet TAKE 1 TABLET(5 MG) BY MOUTH TWICE DAILY 60 tablet 11  . esomeprazole (NEXIUM) 40 MG capsule Take 1 capsule (40 mg total) by mouth daily at 12 noon. 90 capsule 3  . eszopiclone (LUNESTA) 2 MG TABS tablet Take 1 tablet (2 mg total) by mouth at bedtime as needed for sleep. Take immediately before bedtime 30 tablet 2  . flecainide (TAMBOCOR) 50 MG tablet TAKE 1 TABLET BY MOUTH TWICE DAILY 60 tablet 11  . gabapentin (NEURONTIN) 600 MG tablet TAKE 1 TABLET(600 MG) BY MOUTH TWICE DAILY 180 tablet 0  . MEGARED OMEGA-3 KRILL OIL 500 MG CAPS Take 1 tablet by mouth daily.    . Melatonin 5 MG CAPS Take 5 mg by mouth at bedtime.     . Multiple Vitamins-Minerals (CENTRUM SILVER ULTRA WOMENS) TABS Take 1 tablet by mouth daily.    . nortriptyline (PAMELOR) 25 MG capsule TAKE 2 CAPSULES BY MOUTH EVERY NIGHT AT BEDTIME 180 capsule 0  . ZETIA 10 MG tablet TAKE 1 TABLET BY MOUTH EVERY DAY 30 tablet 6   No current facility-administered medications for this visit.     Allergies:   Cefuroxime axetil; Macrodantin; Alendronate sodium; Codeine; Demerol [meperidine]; Meperidine hcl; Other; and Sulfonamide derivatives   Social History:  The patient  reports that she has quit smoking. Her smoking use included Cigarettes. She has a 1.00 pack-year smoking history. She has never used smokeless tobacco. She reports that she drinks alcohol. She reports that she does not use drugs.   Family History:  The patient's family history includes Anxiety disorder in her mother; Bipolar disorder in her mother; Colon cancer in her father; HIV in her son; Heart disease in her maternal grandmother; Mental illness in her mother; Stomach cancer in her maternal grandfather, mother, and paternal grandmother; Stroke in her father; Throat cancer in her maternal uncle.    ROS:  Please see the history of present illness.   All other systems are reviewed and positive for palpitations.    PHYSICAL EXAM: VS:  BP 118/66   Pulse (!)  132   Ht 5\' 3"  (1.6 m)   Wt 147 lb (66.7 kg)   BMI 26.04 kg/m  , BMI Body mass index is 26.04 kg/m. GEN: Well nourished, well developed, in no acute distress  HEENT: normal  Neck: no JVD, carotid bruits, or masses Cardiac: RRR; no murmurs, rubs, or gallops,no edema  Respiratory:  clear to auscultation bilaterally, normal work of breathing GI: soft, nontender, nondistended, + BS MS: no deformity or atrophy  Skin: warm and dry Neuro:  Strength and sensation are intact Psych: euthymic mood, full affect  EKG:  EKG is ordered today. Personal review of the ECG shows atrial flutter, rate 1352  Recent Labs: 12/23/2015: BUN 22; Creatinine, Ser 0.83; Hemoglobin 13.8; Platelets 216; Potassium 3.6; Sodium 138  12/25/2015: Magnesium 1.9; TSH 6.41    Lipid Panel     Component Value Date/Time   CHOL 187 08/03/2014 1053   TRIG 150.0 (H) 08/03/2014 1053   TRIG 133 06/30/2006 1110   HDL 45.80 08/03/2014 1053   CHOLHDL 4 08/03/2014 1053   VLDL 30.0 08/03/2014 1053   LDLCALC 111 (H) 08/03/2014 1053   LDLDIRECT 93.3 08/04/2008 0915     Wt Readings from Last 3 Encounters:  06/24/16 147 lb (66.7 kg)  05/08/16 146 lb (66.2 kg)  02/19/16 146 lb (66.2 kg)      Other studies Reviewed: Additional studies/ records that were reviewed today include: TTE 2015  Review of the above records today demonstrates: - Left ventricle: The cavity size was normal. There was mild concentric hypertrophy. Systolic function was normal. The estimated ejection fraction was in the range of 55% to 60%. Wall motion was normal; there were no regional wall motion abnormalities. The study is not technically sufficient to allow evaluation of LV diastolic function. - Atrial septum: No defect or patent foramen ovale was identified.   ASSESSMENT AND PLAN:  1.  Atrial fibrillation: S/P ablation and put on amiodarone post ablation. On Eliqius 5 mg BID.  She is tolerating the Eliquis well, she has a CHADS2VASC  score of 3.  Was put on both amiodarone and flecainide post ablation as she has had breakthrough AF.  Unfortunately, she went back out of rhythm 2 days ago, and is currently in atrial flutter today I discussed with her options of medication changes cardioversion, and ablation. Risks and benefits of ablation were discussed. Risks include bleeding, tamponade, heart block, stroke, and damage to surrounding organs. She does agree that repeat ablation would be appropriate at this time. In the interim, we'll plan for cardioversion as well as increasing her flecainide to 100 mg twice daily.  This patients CHA2DS2-VASc Score and unadjusted Ischemic Stroke Rate (% per year) is equal to 3.2 % stroke rate/year from a score of 3  Above score calculated as 1 point each if present [CHF, HTN, DM, Vascular=MI/PAD/Aortic Plaque, Age if 65-74, or Female] Above score calculated as 2 points each if present [Age > 75, or Stroke/TIA/TE]  Current medicines are reviewed at length with the patient today.   The patient does not have concerns regarding her medicines.  The following changes were made today:  Increase flecainide to 100 mg 3 times a day  Labs/ tests ordered today include:  No orders of the defined types were placed in this encounter.    Disposition:   FU with Andros Channing 3  months  Signed, Amore Ackman Meredith Leeds, MD  06/24/2016 4:01 PM     [address] [place] [address] [phone] (office) [phone] (fax)

## 2016-06-25 NOTE — Addendum Note (Signed)
Addended by: Stanton Kidney on: 06/25/2016 09:49 AM   Modules accepted: Orders

## 2016-06-26 DIAGNOSIS — N390 Urinary tract infection, site not specified: Secondary | ICD-10-CM | POA: Diagnosis not present

## 2016-06-26 DIAGNOSIS — Z79899 Other long term (current) drug therapy: Secondary | ICD-10-CM | POA: Diagnosis not present

## 2016-06-26 DIAGNOSIS — Z1389 Encounter for screening for other disorder: Secondary | ICD-10-CM | POA: Diagnosis not present

## 2016-06-26 DIAGNOSIS — K219 Gastro-esophageal reflux disease without esophagitis: Secondary | ICD-10-CM | POA: Diagnosis not present

## 2016-06-26 DIAGNOSIS — Z23 Encounter for immunization: Secondary | ICD-10-CM | POA: Diagnosis not present

## 2016-06-26 DIAGNOSIS — Z0000 Encounter for general adult medical examination without abnormal findings: Secondary | ICD-10-CM | POA: Diagnosis not present

## 2016-06-26 DIAGNOSIS — I480 Paroxysmal atrial fibrillation: Secondary | ICD-10-CM | POA: Diagnosis not present

## 2016-06-26 DIAGNOSIS — E7800 Pure hypercholesterolemia, unspecified: Secondary | ICD-10-CM | POA: Diagnosis not present

## 2016-06-27 ENCOUNTER — Telehealth: Payer: Self-pay | Admitting: *Deleted

## 2016-06-27 NOTE — Telephone Encounter (Signed)
Increased Flecainide at OV w/ Camnitz on 10/3. Follow up EKG scheduled for 10/16. Patient verbalized understanding and agreeable to plan.

## 2016-07-01 ENCOUNTER — Ambulatory Visit (HOSPITAL_COMMUNITY): Payer: Medicare Other | Admitting: Anesthesiology

## 2016-07-01 ENCOUNTER — Ambulatory Visit (HOSPITAL_COMMUNITY)
Admission: RE | Admit: 2016-07-01 | Discharge: 2016-07-01 | Disposition: A | Discharge status: Home / Self Care | Payer: Medicare Other | Source: Ambulatory Visit | Attending: Cardiovascular Disease | Admitting: Cardiovascular Disease

## 2016-07-01 ENCOUNTER — Encounter (HOSPITAL_COMMUNITY): Payer: Self-pay | Admitting: *Deleted

## 2016-07-01 ENCOUNTER — Encounter (HOSPITAL_COMMUNITY)
Admission: RE | Disposition: A | Discharge status: Home / Self Care | Payer: Self-pay | Source: Ambulatory Visit | Attending: Cardiovascular Disease

## 2016-07-01 DIAGNOSIS — Z87891 Personal history of nicotine dependence: Secondary | ICD-10-CM | POA: Insufficient documentation

## 2016-07-01 DIAGNOSIS — R51 Headache: Secondary | ICD-10-CM | POA: Diagnosis not present

## 2016-07-01 DIAGNOSIS — I4891 Unspecified atrial fibrillation: Secondary | ICD-10-CM | POA: Diagnosis not present

## 2016-07-01 DIAGNOSIS — E785 Hyperlipidemia, unspecified: Secondary | ICD-10-CM | POA: Insufficient documentation

## 2016-07-01 DIAGNOSIS — M797 Fibromyalgia: Secondary | ICD-10-CM | POA: Diagnosis not present

## 2016-07-01 DIAGNOSIS — Z96651 Presence of right artificial knee joint: Secondary | ICD-10-CM | POA: Insufficient documentation

## 2016-07-01 DIAGNOSIS — Z96642 Presence of left artificial hip joint: Secondary | ICD-10-CM | POA: Diagnosis not present

## 2016-07-01 DIAGNOSIS — Z7901 Long term (current) use of anticoagulants: Secondary | ICD-10-CM | POA: Diagnosis not present

## 2016-07-01 DIAGNOSIS — I4892 Unspecified atrial flutter: Secondary | ICD-10-CM | POA: Insufficient documentation

## 2016-07-01 DIAGNOSIS — E039 Hypothyroidism, unspecified: Secondary | ICD-10-CM | POA: Insufficient documentation

## 2016-07-01 DIAGNOSIS — M1990 Unspecified osteoarthritis, unspecified site: Secondary | ICD-10-CM | POA: Insufficient documentation

## 2016-07-01 DIAGNOSIS — K219 Gastro-esophageal reflux disease without esophagitis: Secondary | ICD-10-CM | POA: Diagnosis not present

## 2016-07-01 DIAGNOSIS — I739 Peripheral vascular disease, unspecified: Secondary | ICD-10-CM | POA: Diagnosis not present

## 2016-07-01 DIAGNOSIS — I484 Atypical atrial flutter: Secondary | ICD-10-CM

## 2016-07-01 HISTORY — PX: CARDIOVERSION: SHX1299

## 2016-07-01 SURGERY — CARDIOVERSION
Anesthesia: Moderate Sedation

## 2016-07-01 MED ORDER — PHENYLEPHRINE HCL 10 MG/ML IJ SOLN
INTRAMUSCULAR | Status: DC | PRN
  Administered 2016-07-01: 100 ug via INTRAVENOUS

## 2016-07-01 MED ORDER — LIDOCAINE HCL (CARDIAC) 20 MG/ML IV SOLN
INTRAVENOUS | Status: DC | PRN
  Administered 2016-07-01: 60 mg via INTRAVENOUS

## 2016-07-01 MED ORDER — PROPOFOL 10 MG/ML IV BOLUS
INTRAVENOUS | Status: DC | PRN
  Administered 2016-07-01: 70 mg via INTRAVENOUS

## 2016-07-01 MED ORDER — EPHEDRINE SULFATE 50 MG/ML IJ SOLN
INTRAMUSCULAR | Status: DC | PRN
  Administered 2016-07-01: 20 mg via INTRAVENOUS
  Administered 2016-07-01: 10 mg via INTRAVENOUS
  Administered 2016-07-01: 15 mg via INTRAVENOUS
  Administered 2016-07-01: 10 mg via INTRAVENOUS
  Administered 2016-07-01: 20 mg via INTRAVENOUS
  Administered 2016-07-01: 10 mg via INTRAVENOUS

## 2016-07-01 NOTE — H&P (View-Only) (Signed)
Electrophysiology Office Note   Date:  06/24/2016   ID:  Chelsea Hancock, DOB [birth year], MRN [id number]  PCP:  [name], MD  Cardiologist:  [name] Primary Electrophysiologist:  [name], MD    Chief Complaint  Patient presents with  . Follow-up    afib     History of Present Illness: Chelsea Hancock is a 82 y.o. female who presents today for electrophysiology evaluation.   She had an ablation for atrial fib/flutter on 07/2015. She started on amiodarone at that time.  She has been having palpitations and was found to have atrial fibrillation on a 30 day monitor early in the morning.  Since that time, she has had a few more episodes of palpitations. Her amiodarone was stopped and she was started on flecainide.   Today, she denies symptoms of palpitations, chest pain, shortness of breath, orthopnea, PND, lower extremity edema, claudication, dizziness, presyncope, syncope, bleeding, or neurologic sequela. The patient is tolerating medications without difficulties. She has not had any further episodes of atrial fibrillation that she can tell.  She is tolerating her flecainide and Eliquis without issues. Last Sunday, she went out of rhythm again. She was told to take an x-rayflecainide. She initially complained that she was having palpitations, but now her symptoms are mainly of fatigue and shortness of breath. She was told to take an extra dose of flecainide, and she presents to clinic today in atrial flutter.  Past Medical History:  Diagnosis Date  . Adenomatous polyp of colon 1991, 2008  . Arthritis   . Atrial fibrillation and flutter ([place])   . Carotid bruit    Right  . Chronic back pain   . Depression with anxiety 03/29/2013  . Diverticulosis   . Falls frequently   . Fibromyalgia   . Foot drop, right   . GERD (gastroesophageal reflux disease)   . Hemorrhoids   . Hyperlipidemia   . IBS (irritable bowel syndrome)   . Memory loss 02/11/2015    . Onychomycosis 07/03/2013  . Osteoporosis   . Sigmoid colon ulcer 03/23/2012  . Skin problem in pregnancy    takes gabapentin 3 times a day and also has an ointment   Past Surgical History:  Procedure Laterality Date  . ABDOMINAL HYSTERECTOMY  1981  . BACK SURGERY  1997   reconstructive lower back surgery  . CARPAL TUNNEL RELEASE Left 2012   ulnar nerve release at elbow  . CERVICAL FUSION  2003, 2005, 2013  . COLONOSCOPY    . ELECTROPHYSIOLOGIC STUDY N/A 08/10/2015   Procedure: Afib;  Surgeon: [name], MD;  Location: [facility];  Service: Cardiovascular;  Laterality: N/A;  . EYE SURGERY  2015   b/l cataracts removed  . left elbow surgery  06/03/11   cyst removed  . LUMBAR LAMINECTOMY  1960   x 2  . POSTERIOR CERVICAL FUSION/FORAMINOTOMY  08/03/2012   Procedure: POSTERIOR CERVICAL FUSION/FORAMINOTOMY LEVEL 5;  Surgeon: [name], MD;  Location: [facility];  Service: Neurosurgery;  Laterality: N/A;  Cervical four to Thoracic two Posterior cervical decompression with Facet and Pedicle screw fixation  . TONSILLECTOMY  1943  . TOTAL HIP ARTHROPLASTY Left 2012  . TOTAL KNEE ARTHROPLASTY Right 11/20/2014   Procedure: RIGHT TOTAL KNEE ARTHROPLASTY;  Surgeon: [name], MD;  Location: [facility];  Service: Orthopedics;  Laterality: Right;  . veins stripped  1970  . WRIST SURGERY Right 1990's   cyst removed  Current Outpatient Prescriptions  Medication Sig Dispense Refill  . atorvastatin (LIPITOR) 10 MG tablet Take 10 mg by mouth daily.    . Biotin 5000 MCG TABS Take 5,000 mcg by mouth daily.    . bisoprolol (ZEBETA) 5 MG tablet Take 5 mg by mouth daily. Ok to take extra 2.5 mg as needed for palpitations    . Calcium Carb-Cholecalciferol 600-200 MG-UNIT TABS Take 1 tablet by mouth daily.    Marland Kitchen diltiazem (CARDIZEM CD) 120 MG 24 hr capsule Take 1 capsule (120 mg total) by mouth daily. May take additional dose daily if has palpitation 30 capsule 5  . ELIQUIS 5  MG TABS tablet TAKE 1 TABLET(5 MG) BY MOUTH TWICE DAILY 60 tablet 11  . esomeprazole (NEXIUM) 40 MG capsule Take 1 capsule (40 mg total) by mouth daily at 12 noon. 90 capsule 3  . eszopiclone (LUNESTA) 2 MG TABS tablet Take 1 tablet (2 mg total) by mouth at bedtime as needed for sleep. Take immediately before bedtime 30 tablet 2  . flecainide (TAMBOCOR) 50 MG tablet TAKE 1 TABLET BY MOUTH TWICE DAILY 60 tablet 11  . gabapentin (NEURONTIN) 600 MG tablet TAKE 1 TABLET(600 MG) BY MOUTH TWICE DAILY 180 tablet 0  . MEGARED OMEGA-3 KRILL OIL 500 MG CAPS Take 1 tablet by mouth daily.    . Melatonin 5 MG CAPS Take 5 mg by mouth at bedtime.     . Multiple Vitamins-Minerals (CENTRUM SILVER ULTRA WOMENS) TABS Take 1 tablet by mouth daily.    . nortriptyline (PAMELOR) 25 MG capsule TAKE 2 CAPSULES BY MOUTH EVERY NIGHT AT BEDTIME 180 capsule 0  . ZETIA 10 MG tablet TAKE 1 TABLET BY MOUTH EVERY DAY 30 tablet 6   No current facility-administered medications for this visit.     Allergies:   Cefuroxime axetil; Macrodantin; Alendronate sodium; Codeine; Demerol [meperidine]; Meperidine hcl; Other; and Sulfonamide derivatives   Social History:  The patient  reports that she has quit smoking. Her smoking use included Cigarettes. She has a 1.00 pack-year smoking history. She has never used smokeless tobacco. She reports that she drinks alcohol. She reports that she does not use drugs.   Family History:  The patient's family history includes Anxiety disorder in her mother; Bipolar disorder in her mother; Colon cancer in her father; HIV in her son; Heart disease in her maternal grandmother; Mental illness in her mother; Stomach cancer in her maternal grandfather, mother, and paternal grandmother; Stroke in her father; Throat cancer in her maternal uncle.    ROS:  Please see the history of present illness.   All other systems are reviewed and positive for palpitations.    PHYSICAL EXAM: VS:  BP 118/66   Pulse (!)  132   Ht 5\' 3"  (1.6 m)   Wt 147 lb (66.7 kg)   BMI 26.04 kg/m  , BMI Body mass index is 26.04 kg/m. GEN: Well nourished, well developed, in no acute distress  HEENT: normal  Neck: no JVD, carotid bruits, or masses Cardiac: RRR; no murmurs, rubs, or gallops,no edema  Respiratory:  clear to auscultation bilaterally, normal work of breathing GI: soft, nontender, nondistended, + BS MS: no deformity or atrophy  Skin: warm and dry Neuro:  Strength and sensation are intact Psych: euthymic mood, full affect  EKG:  EKG is ordered today. Personal review of the ECG shows atrial flutter, rate 1352  Recent Labs: 12/23/2015: BUN 22; Creatinine, Ser 0.83; Hemoglobin 13.8; Platelets 216; Potassium 3.6; Sodium 138  12/25/2015: Magnesium 1.9; TSH 6.41    Lipid Panel     Component Value Date/Time   CHOL 187 08/03/2014 1053   TRIG 150.0 (H) 08/03/2014 1053   TRIG 133 06/30/2006 1110   HDL 45.80 08/03/2014 1053   CHOLHDL 4 08/03/2014 1053   VLDL 30.0 08/03/2014 1053   LDLCALC 111 (H) 08/03/2014 1053   LDLDIRECT 93.3 08/04/2008 0915     Wt Readings from Last 3 Encounters:  06/24/16 147 lb (66.7 kg)  05/08/16 146 lb (66.2 kg)  02/19/16 146 lb (66.2 kg)      Other studies Reviewed: Additional studies/ records that were reviewed today include: TTE 2015  Review of the above records today demonstrates: - Left ventricle: The cavity size was normal. There was mild concentric hypertrophy. Systolic function was normal. The estimated ejection fraction was in the range of 55% to 60%. Wall motion was normal; there were no regional wall motion abnormalities. The study is not technically sufficient to allow evaluation of LV diastolic function. - Atrial septum: No defect or patent foramen ovale was identified.   ASSESSMENT AND PLAN:  1.  Atrial fibrillation: S/P ablation and put on amiodarone post ablation. On Eliqius 5 mg BID.  She is tolerating the Eliquis well, she has a CHADS2VASC  score of 3.  Was put on both amiodarone and flecainide post ablation as she has had breakthrough AF.  Unfortunately, she went back out of rhythm 2 days ago, and is currently in atrial flutter today I discussed with her options of medication changes cardioversion, and ablation. Risks and benefits of ablation were discussed. Risks include bleeding, tamponade, heart block, stroke, and damage to surrounding organs. She does agree that repeat ablation would be appropriate at this time. In the interim, we'll plan for cardioversion as well as increasing her flecainide to 100 mg twice daily.  This patients CHA2DS2-VASc Score and unadjusted Ischemic Stroke Rate (% per year) is equal to 3.2 % stroke rate/year from a score of 3  Above score calculated as 1 point each if present [CHF, HTN, DM, Vascular=MI/PAD/Aortic Plaque, Age if 65-74, or Female] Above score calculated as 2 points each if present [Age > 75, or Stroke/TIA/TE]  Current medicines are reviewed at length with the patient today.   The patient does not have concerns regarding her medicines.  The following changes were made today:  Increase flecainide to 100 mg 3 times a day  Labs/ tests ordered today include:  No orders of the defined types were placed in this encounter.    Disposition:   FU with Makara Lanzo 3  months  Signed, Phillis Thackeray Meredith Leeds, MD  06/24/2016 4:01 PM     [address] [place] [address] [phone] (office) [phone] (fax)

## 2016-07-01 NOTE — Interval H&P Note (Signed)
History and Physical Interval Note:  07/01/2016 12:10 PM  Chelsea Hancock  has presented today for surgery, with the diagnosis of AFLUTTER  The various methods of treatment have been discussed with the patient and family. After consideration of risks, benefits and other options for treatment, the patient has consented to  Procedure(s): CARDIOVERSION (N/A) as a surgical intervention .  The patient's history has been reviewed, patient examined, no change in status, stable for surgery.  I have reviewed the patient's chart and labs.  Questions were answered to the patient's satisfaction.     Mertie Moores

## 2016-07-01 NOTE — Discharge Instructions (Signed)
Electrical Cardioversion, Care After °Refer to this sheet in the next few weeks. These instructions provide you with information on caring for yourself after your procedure. Your health care provider may also give you more specific instructions. Your treatment has been planned according to current medical practices, but problems sometimes occur. Call your health care provider if you have any problems or questions after your procedure. °WHAT TO EXPECT AFTER THE PROCEDURE °After your procedure, it is typical to have the following sensations: °· Some redness on the skin where the shocks were delivered. If this is tender, a sunburn lotion or hydrocortisone cream may help. °· Possible return of an abnormal heart rhythm within hours or days after the procedure. °HOME CARE INSTRUCTIONS °· Take medicines only as directed by your health care provider. Be sure you understand how and when to take your medicine. °· Learn how to feel your pulse and check it often. °· Limit your activity for 48 hours after the procedure or as directed by your health care provider. °· Avoid or minimize caffeine and other stimulants as directed by your health care provider. °SEEK MEDICAL CARE IF: °· You feel like your heart is beating too fast or your pulse is not regular. °· You have any questions about your medicines. °· You have bleeding that will not stop. °SEEK IMMEDIATE MEDICAL CARE IF: °· You are dizzy or feel faint. °· It is hard to breathe or you feel short of breath. °· There is a change in discomfort in your chest. °· Your speech is slurred or you have trouble moving an arm or leg on one side of your body. °· You get a serious muscle cramp that does not go away. °· Your fingers or toes turn cold or blue. °  °This information is not intended to replace advice given to you by your health care provider. Make sure you discuss any questions you have with your health care provider. °  °Document Released: 06/29/2013 Document Revised: 09/29/2014  Document Reviewed: 06/29/2013 °Elsevier Interactive Patient Education ©2016 [employer] ° °

## 2016-07-01 NOTE — CV Procedure (Signed)
    Cardioversion Note  JESSEY HI [id number] 01/01/1934  Procedure: DC Cardioversion Indications: atrial flutter   Procedure Details Consent: Obtained Time Out: Verified patient identification, verified procedure, site/side was marked, verified correct patient position, special equipment/implants available, Radiology Safety Procedures followed,  medications/allergies/relevent history reviewed, required imaging and test results available.  Performed  The patient has been on adequate anticoagulation.  The patient received IV Lidocaine 70 mg followed by Propofol 65 mg  for sedation.  Synchronous cardioversion was performed at 30  joules.  The cardioversion was successful     Complications: No apparent complications Patient did tolerate procedure well.   Thayer Headings, Brooke Bonito., MD, [facility] 07/01/2016, 12:22 PM

## 2016-07-01 NOTE — Transfer of Care (Signed)
Immediate Anesthesia Transfer of Care Note  Patient: Chelsea Hancock  Procedure(s) Performed: Procedure(s): CARDIOVERSION (N/A)  Patient Location: Endoscopy Unit  Anesthesia Type:General  Level of Consciousness: awake, alert , oriented and patient cooperative  Airway & Oxygen Therapy: Patient Spontanous Breathing and Patient connected to nasal cannula oxygen  Post-op Assessment: Report given to RN  Post vital signs: Reviewed and stable  Last Vitals:  Vitals:   07/01/16 1117  BP: (!) 113/6  Pulse: 93  Temp: 36.6 C    Last Pain:  Vitals:   07/01/16 1117  TempSrc: Oral         Complications: No apparent anesthesia complications   Mild hypotension treated with neosynephrine and ephedrine.  Dr. Linna Caprice at bedside.  Report to RN.  Placed in Trendelenburg position for BP.

## 2016-07-01 NOTE — Anesthesia Preprocedure Evaluation (Signed)
Anesthesia Evaluation  Patient identified by MRN, date of birth, ID band Patient awake    Reviewed: Allergy & Precautions, NPO status , Patient's Chart, lab work & pertinent test results  History of Anesthesia Complications Negative for: history of anesthetic complications  Airway Mallampati: II  TM Distance: >3 FB Neck ROM: Full    Dental no notable dental hx. (+) Dental Advisory Given   Pulmonary former smoker,    Pulmonary exam normal breath sounds clear to auscultation       Cardiovascular + Peripheral Vascular Disease  Normal cardiovascular exam+ dysrhythmias Atrial Fibrillation  Rhythm:Regular Rate:Normal     Neuro/Psych  Headaches, PSYCHIATRIC DISORDERS Depression    GI/Hepatic Neg liver ROS, PUD, GERD  ,  Endo/Other  Hypothyroidism   Renal/GU negative Renal ROS  negative genitourinary   Musculoskeletal  (+) Arthritis , Fibromyalgia -  Abdominal   Peds negative pediatric ROS (+)  Hematology negative hematology ROS (+)   Anesthesia Other Findings   Reproductive/Obstetrics negative OB ROS                             Anesthesia Physical Anesthesia Plan  ASA: III  Anesthesia Plan: MAC   Post-op Pain Management:    Induction: Intravenous  Airway Management Planned:   Additional Equipment:   Intra-op Plan:   Post-operative Plan:   Informed Consent: I have reviewed the patients History and Physical, chart, labs and discussed the procedure including the risks, benefits and alternatives for the proposed anesthesia with the patient or authorized representative who has indicated his/her understanding and acceptance.   Dental advisory given  Plan Discussed with: CRNA  Anesthesia Plan Comments:         Anesthesia Quick Evaluation

## 2016-07-02 ENCOUNTER — Encounter (HOSPITAL_COMMUNITY): Payer: Self-pay | Admitting: Cardiovascular Disease

## 2016-07-02 NOTE — Anesthesia Postprocedure Evaluation (Signed)
Anesthesia Post Note  Patient: Chelsea Hancock  Procedure(s) Performed: Procedure(s) (LRB): CARDIOVERSION (N/A)  Anesthesia Type: MAC Vital Signs Assessment: post-procedure vital signs reviewed and stable Cardiovascular status: stable Anesthetic complications: no    Last Vitals:  Vitals:   07/01/16 1336 07/01/16 1337  BP: 104/61 106/69  Pulse:  (!) 57  Resp:  20  Temp:      Last Pain:  Vitals:   07/01/16 1117  TempSrc: Oral                 Harsha Yusko JENNETTE

## 2016-07-07 ENCOUNTER — Ambulatory Visit (INDEPENDENT_AMBULATORY_CARE_PROVIDER_SITE_OTHER): Payer: Medicare Other | Admitting: *Deleted

## 2016-07-07 VITALS — BP 138/74 | HR 58 | Ht 63.0 in | Wt 149.0 lb

## 2016-07-07 DIAGNOSIS — I4892 Unspecified atrial flutter: Secondary | ICD-10-CM

## 2016-07-07 NOTE — Patient Instructions (Signed)
Pt is aware that Dr Macky Lower nurse will call her after MD review  the EKG with recommendations if needed. Pt verbalized understanding.

## 2016-07-07 NOTE — Progress Notes (Signed)
1.) Reason for visit: 12 lead EKG BP 138/74 HR 58 sinus Bradycardia  2.) Name of MD requesting visit: Sherald Barge MD  3.) H&P: pt has a history of Afib, a-flutter.    4.) ROS related to problem:Post Cardioversion 06/24/16.  5.) Assessment and plan per MD:Pt in for an EKG. Flecainide medication dose increased to 100 mg twice a day prior Cardioversion on 06/24/16. Pt is  doing well. Has no complaints.  12 leads EKG giver to Trinidad Curet RN for Dr. Ileana Ladd to read and make recommendations if needed.

## 2016-07-17 DIAGNOSIS — Z961 Presence of intraocular lens: Secondary | ICD-10-CM | POA: Diagnosis not present

## 2016-07-17 DIAGNOSIS — H40013 Open angle with borderline findings, low risk, bilateral: Secondary | ICD-10-CM | POA: Diagnosis not present

## 2016-07-17 DIAGNOSIS — H35033 Hypertensive retinopathy, bilateral: Secondary | ICD-10-CM | POA: Diagnosis not present

## 2016-07-17 DIAGNOSIS — H26491 Other secondary cataract, right eye: Secondary | ICD-10-CM | POA: Diagnosis not present

## 2016-07-18 ENCOUNTER — Other Ambulatory Visit: Payer: Self-pay | Admitting: *Deleted

## 2016-07-18 ENCOUNTER — Telehealth: Payer: Self-pay | Admitting: *Deleted

## 2016-07-18 DIAGNOSIS — I4819 Other persistent atrial fibrillation: Secondary | ICD-10-CM

## 2016-07-18 DIAGNOSIS — Z01812 Encounter for preprocedural laboratory examination: Secondary | ICD-10-CM

## 2016-07-18 NOTE — Telephone Encounter (Signed)
lmtcb -  to discuss/review ablation (repeat AFib ablation 08/15/16) instructions and schedule post procedure f/u appts

## 2016-07-19 ENCOUNTER — Encounter (HOSPITAL_COMMUNITY): Payer: Self-pay | Admitting: *Deleted

## 2016-07-19 ENCOUNTER — Telehealth: Payer: Self-pay | Admitting: Internal Medicine

## 2016-07-19 ENCOUNTER — Emergency Department (HOSPITAL_COMMUNITY)
Admission: EM | Admit: 2016-07-19 | Discharge: 2016-07-19 | Disposition: A | Discharge status: Home / Self Care | Payer: Medicare Other | Attending: Emergency Medicine | Admitting: Emergency Medicine

## 2016-07-19 ENCOUNTER — Emergency Department (HOSPITAL_COMMUNITY): Payer: Medicare Other

## 2016-07-19 DIAGNOSIS — Z96642 Presence of left artificial hip joint: Secondary | ICD-10-CM | POA: Insufficient documentation

## 2016-07-19 DIAGNOSIS — Z96651 Presence of right artificial knee joint: Secondary | ICD-10-CM | POA: Insufficient documentation

## 2016-07-19 DIAGNOSIS — Z79899 Other long term (current) drug therapy: Secondary | ICD-10-CM | POA: Diagnosis not present

## 2016-07-19 DIAGNOSIS — Z7901 Long term (current) use of anticoagulants: Secondary | ICD-10-CM | POA: Insufficient documentation

## 2016-07-19 DIAGNOSIS — E039 Hypothyroidism, unspecified: Secondary | ICD-10-CM | POA: Diagnosis not present

## 2016-07-19 DIAGNOSIS — Z87891 Personal history of nicotine dependence: Secondary | ICD-10-CM | POA: Diagnosis not present

## 2016-07-19 DIAGNOSIS — R000 Tachycardia, unspecified: Secondary | ICD-10-CM | POA: Diagnosis not present

## 2016-07-19 DIAGNOSIS — R002 Palpitations: Secondary | ICD-10-CM | POA: Diagnosis present

## 2016-07-19 LAB — CBC
HCT: 39.9 % (ref 36.0–46.0)
Hemoglobin: 13.4 g/dL (ref 12.0–15.0)
MCH: 30.7 pg (ref 26.0–34.0)
MCHC: 33.6 g/dL (ref 30.0–36.0)
MCV: 91.3 fL (ref 78.0–100.0)
Platelets: 221 10*3/uL (ref 150–400)
RBC: 4.37 MIL/uL (ref 3.87–5.11)
RDW: 13.8 % (ref 11.5–15.5)
WBC: 7.7 10*3/uL (ref 4.0–10.5)

## 2016-07-19 LAB — BASIC METABOLIC PANEL
Anion gap: 9 (ref 5–15)
BUN: 26 mg/dL — ABNORMAL HIGH (ref 6–20)
CO2: 25 mmol/L (ref 22–32)
Calcium: 9.6 mg/dL (ref 8.9–10.3)
Chloride: 102 mmol/L (ref 101–111)
Creatinine, Ser: 0.82 mg/dL (ref 0.44–1.00)
GFR calc Af Amer: >60 mL/min (ref >60)
GFR calc non Af Amer: >60 mL/min (ref >60)
Glucose, Bld: 126 mg/dL — ABNORMAL HIGH (ref 65–99)
Potassium: 3.7 mmol/L (ref 3.5–5.1)
Sodium: 136 mmol/L (ref 135–145)

## 2016-07-19 LAB — TROPONIN I: Troponin I: <0.03 ng/mL (ref <0.03)

## 2016-07-19 NOTE — ED Notes (Signed)
Patient used wheelchair to get to the room.  Up with assistance to get into bed.

## 2016-07-19 NOTE — ED Provider Notes (Signed)
[facility] DEPT Provider Note   CSN: [id number] Arrival date & time: 07/19/16  1918  History   Chief Complaint Chief Complaint  Patient presents with  . Tachycardia    HPI Chelsea Hancock is a 82 y.o. female.  Chelsea Hancock is a 82 yo woman with PMH afib/aflutter, chronic back pain, depression/anxiety, falls, fibromyalgia who presents following several hours of palpitations at home. Her symptoms began abruptly while she was laying down/napping and persisted until this evening. She called her cardiologist and took her vitals (158/85, HR 79) and he suggested taking an extra dose of Bisoprolol and to go to the ER if her symptoms have not improved. She denies new chest pressure, shortness of breath, nausea, diaphoresis, dizziness, anxiety, or cough. She was hospitalized two weeks ago and underwent a cardioversion or afib and has been asymptomatic and active since then, was outside raking leaves this morning. She has longstanding intermittent left sided chest discomfort that is unrelated to exertion and present at the time of this encounter.       Past Medical History:  Diagnosis Date  . Adenomatous polyp of colon 1991, 2008  . Arthritis   . Atrial fibrillation and flutter ([place])   . Carotid bruit    Right  . Chronic back pain   . Depression with anxiety 03/29/2013  . Diverticulosis   . Falls frequently   . Fibromyalgia   . Foot drop, right   . GERD (gastroesophageal reflux disease)   . Hemorrhoids   . Hyperlipidemia   . IBS (irritable bowel syndrome)   . Memory loss 02/11/2015  . Onychomycosis 07/03/2013  . Osteoporosis   . Sigmoid colon ulcer 03/23/2012  . Skin problem in pregnancy    takes gabapentin 3 times a day and also has an ointment    Patient Active Problem List   Diagnosis Date Noted  . Chronic anticoagulation - Eliquis, CHADS2VASC=2 08/11/2015  . AF (atrial fibrillation) ([place]) 08/10/2015  . Atrial fibrillation with rapid ventricular response ([place])  07/07/2015  . Mild cognitive impairment with memory loss 03/21/2015  . Atrial fibrillation with RVR ([place]) 02/19/2015  . Insomnia 02/11/2015  . Protein deficiency ([place]) 02/11/2015  . Absolute anemia 02/11/2015  . Memory loss 02/11/2015  . UTI (urinary tract infection) 01/26/2015  . AKI (acute kidney injury) ([place]) 01/21/2015  . Dehydration 01/21/2015  . Hypotension 01/21/2015  . Bacterial vaginosis 12/28/2014  . OA (osteoarthritis) of knee 11/20/2014  . Medicare annual wellness visit, subsequent 08/13/2014  . Dyslipidemia 05/07/2014  . Atrial flutter ([place]) 05/04/2014  . Incontinence of feces with fecal urgency 03/31/2014  . Hot flashes 01/24/2014  . Tachycardia 07/03/2013  . Onychomycosis 07/03/2013  . Personal history of colonic polyps 06/28/2013  . Hypocalcemia 03/29/2013  . Depression with anxiety 03/29/2013  . Migraine 04/27/2012  . Hyperglycemia 04/27/2012  . Thrush 03/23/2012  . Vaginitis 03/23/2012  . Sigmoid colon ulcer 03/23/2012  . Cervical cancer screening 01/27/2012  . Multinodular thyroid 09/02/2011  . Edema 05/12/2011  . CAROTID BRUIT 11/08/2010  . Bilateral knee pain 05/20/2010  . DEGENERATIVE DISC DISEASE, LUMBOSACRAL SPINE - followed by Dr. Ellene Route, [facility]  02/19/2010  . VERTEBRAL FRACTURE 02/19/2010  . LUMBOSACRAL STRAIN, ACUTE 02/19/2010  . BURSITIS, ACROMIOCLAVICULAR, LEFT 09/20/2009  . Irritable bowel syndrome 03/06/2009  . CERVICAL RADICULOPATHY 03/06/2009  . DIARRHEA, RECURRENT 03/06/2009  . GERD 08/04/2008  . FIBROMYALGIA 08/04/2008  . OSTEOPENIA 08/04/2008  . Hypothyroidism 07/22/2007  . ANEMIA 07/22/2007    Past Surgical History:  Procedure  Laterality Date  . ABDOMINAL HYSTERECTOMY  1981  . BACK SURGERY  1997   reconstructive lower back surgery  . CARDIOVERSION N/A 07/01/2016   Procedure: CARDIOVERSION;  Surgeon: Thayer Headings, MD;  Location: [facility];  Service: Cardiovascular;  Laterality: N/A;  . CARPAL TUNNEL RELEASE  Left 2012   ulnar nerve release at elbow  . CERVICAL FUSION  2003, 2005, 2013  . COLONOSCOPY    . ELECTROPHYSIOLOGIC STUDY N/A 08/10/2015   Procedure: Afib;  Surgeon: Will Meredith Leeds, MD;  Location: [facility] CV LAB;  Service: Cardiovascular;  Laterality: N/A;  . EYE SURGERY  2015   b/l cataracts removed  . left elbow surgery  06/03/11   cyst removed  . LUMBAR LAMINECTOMY  1960   x 2  . POSTERIOR CERVICAL FUSION/FORAMINOTOMY  08/03/2012   Procedure: POSTERIOR CERVICAL FUSION/FORAMINOTOMY LEVEL 5;  Surgeon: [name], MD;  Location: [facility];  Service: Neurosurgery;  Laterality: N/A;  Cervical four to Thoracic two Posterior cervical decompression with Facet and Pedicle screw fixation  . TONSILLECTOMY  1943  . TOTAL HIP ARTHROPLASTY Left 2012  . TOTAL KNEE ARTHROPLASTY Right 11/20/2014   Procedure: RIGHT TOTAL KNEE ARTHROPLASTY;  Surgeon: [name], MD;  Location: [facility];  Service: Orthopedics;  Laterality: Right;  . veins stripped  1970  . WRIST SURGERY Right 1990's   cyst removed    OB History    No data available       Home Medications    Prior to Admission medications   Medication Sig Start Date End Date Taking? Authorizing Provider  atorvastatin (LIPITOR) 10 MG tablet Take 10 mg by mouth daily.   Yes Historical Provider, MD  Biotin 5000 MCG TABS Take 5,000 mcg by mouth daily.   Yes Historical Provider, MD  bisoprolol (ZEBETA) 5 MG tablet Take 5 mg by mouth daily. Ok to take extra 2.5 mg as needed for palpitations   Yes Historical Provider, MD  Calcium Carb-Cholecalciferol 600-200 MG-UNIT TABS Take 1 tablet by mouth daily.   Yes Historical Provider, MD  ELIQUIS 5 MG TABS tablet TAKE 1 TABLET(5 MG) BY MOUTH TWICE DAILY 03/27/16  Yes Will Meredith Leeds, MD  esomeprazole (NEXIUM) 40 MG capsule Take 1 capsule (40 mg total) by mouth daily at 12 noon. 10/31/15  Yes Rhonda G Barrett, PA-C  eszopiclone (LUNESTA) 2 MG TABS tablet Take 1 tablet (2 mg total) by mouth at  bedtime as needed for sleep. Take immediately before bedtime 02/26/15  Yes Mosie Lukes, MD  flecainide (TAMBOCOR) 100 MG tablet Take 1 tablet (100 mg total) by mouth 2 (two) times daily. 06/24/16  Yes Will Meredith Leeds, MD  gabapentin (NEURONTIN) 600 MG tablet TAKE 1 TABLET(600 MG) BY MOUTH TWICE DAILY 08/13/15  Yes Darlin Coco, MD  MEGARED OMEGA-3 KRILL OIL 500 MG CAPS Take 1 tablet by mouth daily.   Yes Historical Provider, MD  Melatonin 5 MG CAPS Take 5 mg by mouth at bedtime.    Yes Historical Provider, MD  Multiple Vitamins-Minerals (CENTRUM SILVER ULTRA WOMENS) TABS Take 1 tablet by mouth daily.   Yes Historical Provider, MD  nortriptyline (PAMELOR) 25 MG capsule TAKE 2 CAPSULES BY MOUTH EVERY NIGHT AT BEDTIME 05/30/15  Yes Mosie Lukes, MD  ZETIA 10 MG tablet TAKE 1 TABLET BY MOUTH EVERY DAY 04/06/15  Yes Mosie Lukes, MD    Family History Family History  Problem Relation Age of Onset  . Bipolar disorder Mother   .  Stomach cancer Mother   . Mental illness Mother   . Anxiety disorder Mother   . Stroke Father   . Colon cancer Father   . HIV Son     Aids  . Stomach cancer Maternal Grandfather   . Stomach cancer Paternal Grandmother   . Heart disease Maternal Grandmother   . Throat cancer Maternal Uncle     larynx  . Anesthesia problems Neg Hx   . Diabetes Neg Hx     Social History Social History  Substance Use Topics  . Smoking status: Former Smoker    Packs/day: 0.25    Years: 4.00    Types: Cigarettes  . Smokeless tobacco: Never Used     Comment: quit 20+yrs ago  . Alcohol use 0.0 oz/week     Comment: WINE WITH DINNER. 2 GLASSES RED WINE     Allergies   Cefuroxime axetil; Macrodantin; Alendronate sodium; Codeine; Demerol [meperidine]; Meperidine hcl; Other; and Sulfonamide derivatives   Review of Systems Review of Systems  Constitutional: Negative for appetite change, chills, diaphoresis, fatigue and fever.  Respiratory: Negative for cough, chest  tightness, shortness of breath and wheezing.   Cardiovascular: Positive for chest pain and palpitations.  Gastrointestinal: Negative for abdominal pain, nausea and vomiting.  Neurological: Negative for dizziness and light-headedness.  All other systems reviewed and are negative.    Physical Exam Updated Vital Signs BP 117/64   Pulse (!) 57   Resp 16   SpO2 95%   Physical Exam  Constitutional: She is oriented to person, place, and time. She appears well-developed and well-nourished. No distress.  HENT:  Head: Normocephalic and atraumatic.  Mouth/Throat: Oropharynx is clear and moist.  Eyes: Conjunctivae and EOM are normal. Pupils are equal, round, and reactive to light.  Neck: Normal range of motion. Neck supple.  Cardiovascular: Normal rate, regular rhythm, normal heart sounds and intact distal pulses.  Exam reveals no gallop and no friction rub.   No murmur heard. Pulmonary/Chest: Effort normal and breath sounds normal. No respiratory distress. She has no wheezes. She has no rales. She exhibits no tenderness.  Abdominal: Soft. Bowel sounds are normal. She exhibits no distension and no mass. There is no tenderness. There is no guarding.  Musculoskeletal: Normal range of motion. She exhibits no edema, tenderness or deformity.  Neurological: She is alert and oriented to person, place, and time.  Skin: She is not diaphoretic.     ED Treatments / Results  Labs (all labs ordered are listed, but only abnormal results are displayed) Labs Reviewed  BASIC METABOLIC PANEL - Abnormal; Notable for the following:       Result Value   Glucose, Bld 126 (*)    BUN 26 (*)    All other components within normal limits  CBC  TROPONIN I    EKG  EKG Interpretation  Date/Time:  Saturday July 19 2016 19:23:20 EDT Ventricular Rate:  74 PR Interval:  208 QRS Duration: 96 QT Interval:  410 QTC Calculation: 455 R Axis:   -20 Text Interpretation:  Normal sinus rhythm Normal ECG Confirmed  by BEATON  MD, ROBERT ([id number]) on 07/19/2016 8:59:29 PM       Radiology Dg Chest 2 View  Result Date: 07/19/2016 CLINICAL DATA:  Tachycardia EXAM: CHEST  2 VIEW COMPARISON:  12/23/2015 FINDINGS: Mild scarring in the left mid lung. No focal consolidation. No pleural effusion or pneumothorax. The heart is normal size. Cervical spine fixation hardware. Mild degenerative changes of the upper thoracic spine.  IMPRESSION: No evidence of acute cardiopulmonary disease. Electronically Signed   By: Julian Hy M.D.   On: 07/19/2016 20:34    Procedures Procedures (including critical care time)  Medications Ordered in ED Medications - No data to display   Initial Impression / Assessment and Plan / ED Course  I have reviewed the triage vital signs and the nursing notes.  Pertinent labs & imaging results that were available during my care of the patient were reviewed by me and considered in my medical decision making (see chart for details).  Clinical Course   Chelsea Hancock is a pleasant 82 yo woman with PMH Afib/Aflutter presenting with palpitations of several hour duration at home. Likely transient Afib. She took an additional dose of Bisoprolol but symptoms persisted. Palpitations seem to have resolved by time of arrival at ER. EKG showing NSR, CXR no acute disease. Labs unremarkable including troponin <0.03. Patient discharged home, recommended PCP follow up and return precautions given.   Final Clinical Impressions(s) / ED Diagnoses   Final diagnoses:  Palpitations    New Prescriptions Discharge Medication List as of 07/19/2016  9:34 PM       Asencion Partridge, MD 07/20/16 [id number]    Leonard Schwartz, MD 08/02/16 [id number]

## 2016-07-19 NOTE — ED Triage Notes (Signed)
The pt is c/o her heart beating too strong  No chest pain but for the past week she has had some sl lt upper chest pain  She was here in the past few days and had a cardioversion for af.. She is scheduled for an ablation in nov

## 2016-07-19 NOTE — Telephone Encounter (Signed)
82 y/o woman with h/o AF/AFL followed by Dr. Curt Bears called on-call pager with complaint of her heart "beating hard" for several hours. Otherwise feels well. Says it does not feel like her AF/AFL.   She took her vitals with home cuff BP 158/85  HR 79. She will take an extra dose of bisoprolol. If feels worse or not improved will call 911 or have someone bring her to ER for ECG.   Mare Ludtke,MD 6:03 PM

## 2016-07-19 NOTE — ED Notes (Signed)
Patient Alert and oriented X4. Stable and ambulatory. Patient verbalized understanding of the discharge instructions.  Patient belongings were taken by the patient.  

## 2016-07-19 NOTE — Discharge Instructions (Signed)
Please continue to take your medications as prescribed and follow up with your primary care doctor and cardiologist. If you develop severe chest pain, shortness of breath, or persistent palpitations and fast heart rate please return to the ER.

## 2016-07-21 ENCOUNTER — Encounter: Payer: Self-pay | Admitting: Cardiology

## 2016-07-24 NOTE — Telephone Encounter (Signed)
Left detailed message informing patient that we would review procedure instructions at next weeks OV.  Advised her to call office if she needed anything answered before then.

## 2016-07-29 NOTE — Progress Notes (Signed)
Electrophysiology Office Note   Date:  07/31/2016   ID:  Chelsea Hancock, DOB 1933/10/26, MRN [id number]  PCP:  [name], MD  Cardiologist:  [name] Primary Electrophysiologist:  Jr Milliron Meredith Leeds, MD    Chief Complaint  Patient presents with  . Follow-up    AFib     History of Present Illness: Chelsea Hancock is a 82 y.o. female who presents today for electrophysiology evaluation.   She had an ablation for atrial fib/flutter on 07/2015. Repeat ablation scheduled for 08/15/16. She is continuing to have episodes of palpitations, and has had emergency room fordeciphering that she feels well without any major complaints.  Today, she denies symptoms of chest pain, shortness of breath, orthopnea, PND, lower extremity edema, claudication, dizziness, presyncope, syncope, bleeding, or neurologic sequela. The patient is tolerating medications without difficulties.   Past Medical History:  Diagnosis Date  . Adenomatous polyp of colon 1991, 2008  . Arthritis   . Atrial fibrillation and flutter ([place])   . Carotid bruit    Right  . Chronic back pain   . Depression with anxiety 03/29/2013  . Diverticulosis   . Falls frequently   . Fibromyalgia   . Foot drop, right   . GERD (gastroesophageal reflux disease)   . Hemorrhoids   . Hyperlipidemia   . IBS (irritable bowel syndrome)   . Memory loss 02/11/2015  . Onychomycosis 07/03/2013  . Osteoporosis   . Sigmoid colon ulcer 03/23/2012  . Skin problem in pregnancy    takes gabapentin 3 times a day and also has an ointment   Past Surgical History:  Procedure Laterality Date  . ABDOMINAL HYSTERECTOMY  1981  . BACK SURGERY  1997   reconstructive lower back surgery  . CARDIOVERSION N/A 07/01/2016   Procedure: CARDIOVERSION;  Surgeon: Thayer Headings, MD;  Location: [place];  Service: Cardiovascular;  Laterality: N/A;  . CARPAL TUNNEL RELEASE Left 2012   ulnar nerve release at elbow  . CERVICAL FUSION   2003, 2005, 2013  . COLONOSCOPY    . ELECTROPHYSIOLOGIC STUDY N/A 08/10/2015   Procedure: Afib;  Surgeon: Hester Joslin Meredith Leeds, MD;  Location: [place] [facility];  Service: Cardiovascular;  Laterality: N/A;  . EYE SURGERY  2015   b/l cataracts removed  . left elbow surgery  06/03/11   cyst removed  . LUMBAR LAMINECTOMY  1960   x 2  . POSTERIOR CERVICAL FUSION/FORAMINOTOMY  08/03/2012   Procedure: POSTERIOR CERVICAL FUSION/FORAMINOTOMY LEVEL 5;  Surgeon: [name], MD;  Location: [place] [facility];  Service: Neurosurgery;  Laterality: N/A;  Cervical four to Thoracic two Posterior cervical decompression with Facet and Pedicle screw fixation  . TONSILLECTOMY  1943  . TOTAL HIP ARTHROPLASTY Left 2012  . TOTAL KNEE ARTHROPLASTY Right 11/20/2014   Procedure: RIGHT TOTAL KNEE ARTHROPLASTY;  Surgeon: [name], MD;  Location: [facility];  Service: Orthopedics;  Laterality: Right;  . veins stripped  1970  . WRIST SURGERY Right 1990's   cyst removed     Current Outpatient Prescriptions  Medication Sig Dispense Refill  . atorvastatin (LIPITOR) 10 MG tablet Take 10 mg by mouth daily.    . Biotin 5000 MCG TABS Take 5,000 mcg by mouth daily.    . bisoprolol (ZEBETA) 5 MG tablet Take 5 mg by mouth daily. Ok to take extra 2.5 mg as needed for palpitations    . Calcium Carb-Cholecalciferol 600-200 MG-UNIT TABS Take 1 tablet by mouth daily.    Marland Kitchen ELIQUIS  5 MG TABS tablet TAKE 1 TABLET(5 MG) BY MOUTH TWICE DAILY 60 tablet 11  . esomeprazole (NEXIUM) 40 MG capsule Take 1 capsule (40 mg total) by mouth daily at 12 noon. 90 capsule 3  . eszopiclone (LUNESTA) 2 MG TABS tablet Take 1 tablet (2 mg total) by mouth at bedtime as needed for sleep. Take immediately before bedtime 30 tablet 2  . flecainide (TAMBOCOR) 100 MG tablet Take 1 tablet (100 mg total) by mouth 2 (two) times daily. 180 tablet 2  . gabapentin (NEURONTIN) 600 MG tablet TAKE 1 TABLET(600 MG) BY MOUTH TWICE DAILY 180 tablet 0  . MEGARED OMEGA-3  KRILL OIL 500 MG CAPS Take 1 tablet by mouth daily.    . Melatonin 5 MG CAPS Take 5 mg by mouth at bedtime.     . Multiple Vitamins-Minerals (CENTRUM SILVER ULTRA WOMENS) TABS Take 1 tablet by mouth daily.    . nortriptyline (PAMELOR) 25 MG capsule TAKE 2 CAPSULES BY MOUTH EVERY NIGHT AT BEDTIME 180 capsule 0  . ZETIA 10 MG tablet TAKE 1 TABLET BY MOUTH EVERY DAY 30 tablet 6   No current facility-administered medications for this visit.     Allergies:   Cefuroxime axetil; Macrodantin; Alendronate sodium; Codeine; Demerol [meperidine]; Meperidine hcl; Other; and Sulfonamide derivatives   Social History:  The patient  reports that she has quit smoking. Her smoking use included Cigarettes. She has a 1.00 pack-year smoking history. She has never used smokeless tobacco. She reports that she drinks alcohol. She reports that she does not use drugs.   Family History:  The patient's family history includes Anxiety disorder in her mother; Bipolar disorder in her mother; Colon cancer in her father; HIV in her son; Heart disease in her maternal grandmother; Mental illness in her mother; Stomach cancer in her maternal grandfather, mother, and paternal grandmother; Stroke in her father; Throat cancer in her maternal uncle.    ROS:  Please see the history of present illness.   All other systems are reviewed and positive for palpitations, walking problems.    PHYSICAL EXAM: VS:  BP 124/70   Pulse 68   Ht 5\' 3"  (1.6 m)   Wt 146 lb 3.2 oz (66.3 kg)   BMI 25.90 kg/m  , BMI Body mass index is 25.9 kg/m. GEN: Well nourished, well developed, in no acute distress  HEENT: normal  Neck: no JVD, carotid bruits, or masses Cardiac: RRR; no murmurs, rubs, or gallops,no edema  Respiratory:  clear to auscultation bilaterally, normal work of breathing GI: soft, nontender, nondistended, + BS MS: no deformity or atrophy  Skin: warm and dry Neuro:  Strength and sensation are intact Psych: euthymic mood, full  affect  EKG:  EKG is not ordered today. Personal review of the ECG 10/22 shows sinus rhythm, rate 74  Recent Labs: 12/25/2015: Magnesium 1.9; TSH 6.41 07/19/2016: BUN 26; Creatinine, Ser 0.82; Hemoglobin 13.4; Platelets 221; Potassium 3.7; Sodium 136    Lipid Panel     Component Value Date/Time   CHOL 187 08/03/2014 1053   TRIG 150.0 (H) 08/03/2014 1053   TRIG 133 06/30/2006 1110   HDL 45.80 08/03/2014 1053   CHOLHDL 4 08/03/2014 1053   VLDL 30.0 08/03/2014 1053   LDLCALC 111 (H) 08/03/2014 1053   LDLDIRECT 93.3 08/04/2008 0915     Wt Readings from Last 3 Encounters:  07/31/16 146 lb 3.2 oz (66.3 kg)  07/07/16 149 lb (67.6 kg)  06/24/16 147 lb (66.7 kg)  Other studies Reviewed: Additional studies/ records that were reviewed today include: TTE 2015  Review of the above records today demonstrates: - Left ventricle: The cavity size was normal. There was mild concentric hypertrophy. Systolic function was normal. The estimated ejection fraction was in the range of 55% to 60%. Wall motion was normal; there were no regional wall motion abnormalities. The study is not technically sufficient to allow evaluation of LV diastolic function. - Atrial septum: No defect or patent foramen ovale was identified.   ASSESSMENT AND PLAN:  1.  Atrial fibrillation: S/P ablation and put on amiodarone post ablation. On Eliqius 5 mg BID.  She is tolerating the Eliquis well, she has a CHADS2VASC score of 3.  Repeat AF ablation scheduled 08/15/16.  We'll get a preprocedure CT scan as well as check preprocedure labs today. Risks and benefits of the procedure were explained. Risks include bleeding, tamponade, heart block, stroke, and damage to surrounding organs, among other risks. She understands these risks and has agreed to the procedure. I told her that she can take an extra dose of bisoprolol should she have more episodes of palpitations.  This patients CHA2DS2-VASc Score and  unadjusted Ischemic Stroke Rate (% per year) is equal to 3.2 % stroke rate/year from a score of 3  Above score calculated as 1 point each if present [CHF, HTN, DM, Vascular=MI/PAD/Aortic Plaque, Age if 65-74, or Female] Above score calculated as 2 points each if present [Age > 75, or Stroke/TIA/TE]   3. Hyperlipidemia: Into new atorvastatin and zetia Current medicines are reviewed at length with the patient today.   The patient does not have concerns regarding her medicines.  The following changes were made today:  Increase flecainide to 100 mg 3 times a day  Labs/ tests ordered today include: CBC, BMP No orders of the defined types were placed in this encounter.    Disposition:   FU with Melady Chow 3  months  Signed, Syniah Berne Meredith Leeds, MD  07/31/2016 9:01 AM     [facility] [address] [place] [address] [phone] (office) [phone] (fax)

## 2016-07-31 ENCOUNTER — Ambulatory Visit (INDEPENDENT_AMBULATORY_CARE_PROVIDER_SITE_OTHER): Payer: Medicare Other | Admitting: Cardiology

## 2016-07-31 ENCOUNTER — Encounter: Payer: Self-pay | Admitting: Cardiology

## 2016-07-31 VITALS — BP 124/70 | HR 68 | Ht 63.0 in | Wt 146.2 lb

## 2016-07-31 DIAGNOSIS — I481 Persistent atrial fibrillation: Secondary | ICD-10-CM | POA: Diagnosis not present

## 2016-07-31 DIAGNOSIS — I2510 Atherosclerotic heart disease of native coronary artery without angina pectoris: Secondary | ICD-10-CM

## 2016-07-31 DIAGNOSIS — I4819 Other persistent atrial fibrillation: Secondary | ICD-10-CM

## 2016-07-31 NOTE — Patient Instructions (Signed)
Medication Instructions:  Your physician recommends that you continue on your current medications as directed. Please refer to the Current Medication list given to you today.  Labwork: None ordered  Testing/Procedures: None  ordered  Follow-Up: Your physician recommends that you schedule a follow-up appointment in: 4 weeks, after your procedure on 08/15/2016, with Roderic Palau, NP in the AFib clinic.  Your physician recommends that you schedule a follow-up appointment in: 3 months, after your procedure on 08/15/2016, with Dr. Curt Bears.  If you need a refill on your cardiac medications before your next appointment, please call your pharmacy.  Thank you for choosing [facility]!!   Trinidad Curet, RN [phone]

## 2016-08-04 DIAGNOSIS — H26491 Other secondary cataract, right eye: Secondary | ICD-10-CM | POA: Diagnosis not present

## 2016-08-05 ENCOUNTER — Other Ambulatory Visit: Payer: Medicare Other

## 2016-08-08 ENCOUNTER — Other Ambulatory Visit: Payer: Medicare Other | Admitting: *Deleted

## 2016-08-08 ENCOUNTER — Ambulatory Visit (HOSPITAL_COMMUNITY)
Admission: RE | Admit: 2016-08-08 | Discharge: 2016-08-08 | Disposition: A | Discharge status: Home / Self Care | Payer: Medicare Other | Source: Ambulatory Visit | Attending: Cardiology | Admitting: Cardiology

## 2016-08-08 DIAGNOSIS — Z01812 Encounter for preprocedural laboratory examination: Secondary | ICD-10-CM

## 2016-08-08 DIAGNOSIS — I481 Persistent atrial fibrillation: Secondary | ICD-10-CM | POA: Diagnosis not present

## 2016-08-08 DIAGNOSIS — I4819 Other persistent atrial fibrillation: Secondary | ICD-10-CM

## 2016-08-08 DIAGNOSIS — I2510 Atherosclerotic heart disease of native coronary artery without angina pectoris: Secondary | ICD-10-CM | POA: Diagnosis not present

## 2016-08-08 DIAGNOSIS — I4891 Unspecified atrial fibrillation: Secondary | ICD-10-CM | POA: Diagnosis not present

## 2016-08-08 LAB — CBC WITH DIFFERENTIAL/PLATELET
Basophils Absolute: 0 cells/uL (ref 0–200)
Basophils Relative: 0 %
Eosinophils Absolute: 57 cells/uL (ref 15–500)
Eosinophils Relative: 1 %
HCT: 39.8 % (ref 35.0–45.0)
Hemoglobin: 13.1 g/dL (ref 11.7–15.5)
Lymphocytes Relative: 36 %
Lymphs Abs: 2052 cells/uL (ref 850–3900)
MCH: 30.5 pg (ref 27.0–33.0)
MCHC: 32.9 g/dL (ref 32.0–36.0)
MCV: 92.8 fL (ref 80.0–100.0)
MPV: 11.8 fL (ref 7.5–12.5)
Monocytes Absolute: 456 cells/uL (ref 200–950)
Monocytes Relative: 8 %
Neutro Abs: 3135 cells/uL (ref 1500–7800)
Neutrophils Relative %: 55 %
Platelets: 215 10*3/uL (ref 140–400)
RBC: 4.29 MIL/uL (ref 3.80–5.10)
RDW: 13.4 % (ref 11.0–15.0)
WBC: 5.7 10*3/uL (ref 3.8–10.8)

## 2016-08-08 LAB — BASIC METABOLIC PANEL
BUN: 24 mg/dL (ref 7–25)
CO2: 24 mmol/L (ref 20–31)
Calcium: 8.9 mg/dL (ref 8.6–10.4)
Chloride: 105 mmol/L (ref 98–110)
Creat: 0.76 mg/dL (ref 0.60–0.93)
Glucose, Bld: 96 mg/dL (ref 65–99)
Potassium: 3.9 mmol/L (ref 3.5–5.3)
Sodium: 139 mmol/L (ref 135–146)

## 2016-08-08 MED ORDER — NITROGLYCERIN 0.4 MG SL SUBL
SUBLINGUAL_TABLET | SUBLINGUAL | Status: AC
Start: 2016-08-08 — End: 2016-08-08
  Filled 2016-08-08: qty 1

## 2016-08-08 MED ORDER — NITROGLYCERIN 0.4 MG SL SUBL
0.4000 mg | SUBLINGUAL_TABLET | SUBLINGUAL | Status: DC | PRN
  Administered 2016-08-08: 0.4 mg via SUBLINGUAL

## 2016-08-08 MED ORDER — METOPROLOL TARTRATE 5 MG/5ML IV SOLN
INTRAVENOUS | Status: AC
  Filled 2016-08-08: qty 5

## 2016-08-08 MED ORDER — METOPROLOL TARTRATE 5 MG/5ML IV SOLN
5.0000 mg | Freq: Once | INTRAVENOUS | Status: AC
Start: 2016-08-08 — End: 2016-08-08
  Administered 2016-08-08: 5 mg via INTRAVENOUS

## 2016-08-08 MED ORDER — IOPAMIDOL (ISOVUE-370) INJECTION 76%
INTRAVENOUS | Status: AC
  Administered 2016-08-08: 80 mL
  Filled 2016-08-08: qty 100

## 2016-08-08 NOTE — Addendum Note (Signed)
Addended by: Eulis Foster on: 08/08/2016 08:52 AM   Modules accepted: Orders

## 2016-08-15 ENCOUNTER — Encounter (HOSPITAL_COMMUNITY)
Admission: RE | Disposition: A | Discharge status: Home / Self Care | Payer: Self-pay | Source: Ambulatory Visit | Attending: Cardiology

## 2016-08-15 ENCOUNTER — Ambulatory Visit (HOSPITAL_COMMUNITY): Payer: Medicare Other | Admitting: Anesthesiology

## 2016-08-15 ENCOUNTER — Encounter (HOSPITAL_COMMUNITY): Payer: Self-pay | Admitting: Certified Registered Nurse Anesthetist

## 2016-08-15 ENCOUNTER — Ambulatory Visit (HOSPITAL_COMMUNITY)
Admission: RE | Admit: 2016-08-15 | Discharge: 2016-08-16 | Disposition: A | Discharge status: Home / Self Care | Payer: Medicare Other | Source: Ambulatory Visit | Attending: Cardiology | Admitting: Cardiology

## 2016-08-15 DIAGNOSIS — F418 Other specified anxiety disorders: Secondary | ICD-10-CM | POA: Diagnosis not present

## 2016-08-15 DIAGNOSIS — M797 Fibromyalgia: Secondary | ICD-10-CM | POA: Diagnosis present

## 2016-08-15 DIAGNOSIS — Z7901 Long term (current) use of anticoagulants: Secondary | ICD-10-CM

## 2016-08-15 DIAGNOSIS — E785 Hyperlipidemia, unspecified: Secondary | ICD-10-CM | POA: Diagnosis present

## 2016-08-15 DIAGNOSIS — I4892 Unspecified atrial flutter: Secondary | ICD-10-CM | POA: Insufficient documentation

## 2016-08-15 DIAGNOSIS — I10 Essential (primary) hypertension: Secondary | ICD-10-CM | POA: Diagnosis present

## 2016-08-15 DIAGNOSIS — E039 Hypothyroidism, unspecified: Secondary | ICD-10-CM | POA: Diagnosis present

## 2016-08-15 DIAGNOSIS — I480 Paroxysmal atrial fibrillation: Secondary | ICD-10-CM | POA: Diagnosis not present

## 2016-08-15 DIAGNOSIS — K219 Gastro-esophageal reflux disease without esophagitis: Secondary | ICD-10-CM | POA: Diagnosis not present

## 2016-08-15 DIAGNOSIS — I2510 Atherosclerotic heart disease of native coronary artery without angina pectoris: Secondary | ICD-10-CM | POA: Insufficient documentation

## 2016-08-15 DIAGNOSIS — I4891 Unspecified atrial fibrillation: Secondary | ICD-10-CM | POA: Diagnosis present

## 2016-08-15 DIAGNOSIS — I4819 Other persistent atrial fibrillation: Secondary | ICD-10-CM

## 2016-08-15 DIAGNOSIS — Z87891 Personal history of nicotine dependence: Secondary | ICD-10-CM | POA: Diagnosis not present

## 2016-08-15 HISTORY — DX: Essential (primary) hypertension: I10

## 2016-08-15 HISTORY — PX: ELECTROPHYSIOLOGIC STUDY: SHX172A

## 2016-08-15 LAB — POCT ACTIVATED CLOTTING TIME
Activated Clotting Time: 301 seconds
Activated Clotting Time: 329 seconds
Activated Clotting Time: 180 seconds

## 2016-08-15 SURGERY — ATRIAL FIBRILLATION ABLATION
Anesthesia: General

## 2016-08-15 MED ORDER — CALCIUM CARB-CHOLECALCIFEROL 600-200 MG-UNIT PO TABS: 1.0000 | ORAL_TABLET | Freq: Every day | ORAL | Status: DC

## 2016-08-15 MED ORDER — ADENOSINE 6 MG/2ML IV SOLN
INTRAVENOUS | Status: DC | PRN
  Administered 2016-08-15 (×2): 12 mg via INTRAVENOUS

## 2016-08-15 MED ORDER — ADENOSINE 6 MG/2ML IV SOLN
INTRAVENOUS | Status: AC
  Filled 2016-08-15: qty 4

## 2016-08-15 MED ORDER — SUGAMMADEX SODIUM 200 MG/2ML IV SOLN
INTRAVENOUS | Status: DC | PRN
  Administered 2016-08-15: 300 mg via INTRAVENOUS

## 2016-08-15 MED ORDER — PROPOFOL 10 MG/ML IV BOLUS
INTRAVENOUS | Status: DC | PRN
  Administered 2016-08-15: 140 mg via INTRAVENOUS

## 2016-08-15 MED ORDER — BUPIVACAINE HCL (PF) 0.25 % IJ SOLN
INTRAMUSCULAR | Status: DC | PRN
  Administered 2016-08-15: 40 mL

## 2016-08-15 MED ORDER — MEGARED OMEGA-3 KRILL OIL 500 MG PO CAPS: 1.0000 | ORAL_CAPSULE | Freq: Every day | ORAL | Status: DC

## 2016-08-15 MED ORDER — NORTRIPTYLINE HCL 25 MG PO CAPS
100.0000 mg | ORAL_CAPSULE | Freq: Every day | ORAL | Status: DC
  Administered 2016-08-15: 100 mg via ORAL
  Filled 2016-08-15: qty 4

## 2016-08-15 MED ORDER — DEXAMETHASONE SODIUM PHOSPHATE 4 MG/ML IJ SOLN
INTRAMUSCULAR | Status: DC | PRN
  Administered 2016-08-15: 10 mg via INTRAVENOUS

## 2016-08-15 MED ORDER — EZETIMIBE 10 MG PO TABS: 10.0000 mg | ORAL_TABLET | Freq: Every day | ORAL | Status: DC

## 2016-08-15 MED ORDER — APIXABAN 5 MG PO TABS
5.0000 mg | ORAL_TABLET | Freq: Two times a day (BID) | ORAL | Status: DC
  Administered 2016-08-15: 5 mg via ORAL
  Filled 2016-08-15: qty 1

## 2016-08-15 MED ORDER — ROCURONIUM BROMIDE 100 MG/10ML IV SOLN
INTRAVENOUS | Status: DC | PRN
  Administered 2016-08-15: 40 mg via INTRAVENOUS
  Administered 2016-08-15: 20 mg via INTRAVENOUS

## 2016-08-15 MED ORDER — SODIUM CHLORIDE 0.9 % IV SOLN
INTRAVENOUS | Status: DC | PRN
  Administered 2016-08-15: 13:00:00 via INTRAVENOUS

## 2016-08-15 MED ORDER — SODIUM CHLORIDE 0.9 % IV SOLN: 250.0000 mL | INTRAVENOUS | Status: DC | PRN

## 2016-08-15 MED ORDER — ACETAMINOPHEN 325 MG PO TABS: 650.0000 mg | ORAL_TABLET | ORAL | Status: DC | PRN

## 2016-08-15 MED ORDER — ONDANSETRON HCL 4 MG/2ML IJ SOLN: 4.0000 mg | Freq: Four times a day (QID) | INTRAMUSCULAR | Status: DC | PRN

## 2016-08-15 MED ORDER — PHENYLEPHRINE HCL 10 MG/ML IJ SOLN
INTRAVENOUS | Status: DC | PRN
  Administered 2016-08-15: 40 ug/min via INTRAVENOUS

## 2016-08-15 MED ORDER — PROTAMINE SULFATE 10 MG/ML IV SOLN
INTRAVENOUS | Status: DC | PRN
  Administered 2016-08-15: 40 mg via INTRAVENOUS

## 2016-08-15 MED ORDER — OXYCODONE-ACETAMINOPHEN 5-325 MG PO TABS
1.0000 | ORAL_TABLET | ORAL | Status: DC | PRN
  Administered 2016-08-15: 1 via ORAL
  Filled 2016-08-15: qty 1

## 2016-08-15 MED ORDER — ALPRAZOLAM 0.25 MG PO TABS: 0.2500 mg | ORAL_TABLET | Freq: Two times a day (BID) | ORAL | Status: DC | PRN

## 2016-08-15 MED ORDER — EPHEDRINE SULFATE 50 MG/ML IJ SOLN
INTRAMUSCULAR | Status: DC | PRN
Start: 2016-08-15 — End: 2016-08-15
  Administered 2016-08-15 (×5): 10 mg via INTRAVENOUS

## 2016-08-15 MED ORDER — SODIUM CHLORIDE 0.9% FLUSH
3.0000 mL | Freq: Two times a day (BID) | INTRAVENOUS | Status: DC
  Administered 2016-08-15: 3 mL via INTRAVENOUS

## 2016-08-15 MED ORDER — HEPARIN SODIUM (PORCINE) 1000 UNIT/ML IJ SOLN
INTRAMUSCULAR | Status: DC | PRN
  Administered 2016-08-15: 1 mL via INTRAVENOUS
  Administered 2016-08-15: 12 mL via INTRAVENOUS

## 2016-08-15 MED ORDER — MELATONIN 3 MG PO TABS
3.0000 mg | ORAL_TABLET | Freq: Every day | ORAL | Status: DC
  Administered 2016-08-15: 3 mg via ORAL
  Filled 2016-08-15: qty 1

## 2016-08-15 MED ORDER — BIOTIN 5000 MCG PO TABS: 5000.0000 ug | ORAL_TABLET | Freq: Every day | ORAL | Status: DC

## 2016-08-15 MED ORDER — ATORVASTATIN CALCIUM 10 MG PO TABS: 10.0000 mg | ORAL_TABLET | Freq: Every day | ORAL | Status: DC

## 2016-08-15 MED ORDER — PANTOPRAZOLE SODIUM 40 MG PO TBEC: 40.0000 mg | DELAYED_RELEASE_TABLET | Freq: Every day | ORAL | Status: DC

## 2016-08-15 MED ORDER — HEPARIN (PORCINE) IN NACL 2-0.9 UNIT/ML-% IJ SOLN
INTRAMUSCULAR | Status: DC | PRN
Start: 2016-08-15 — End: 2016-08-15
  Administered 2016-08-15 (×4): 500 mL

## 2016-08-15 MED ORDER — BISOPROLOL FUMARATE 5 MG PO TABS: 5.0000 mg | ORAL_TABLET | Freq: Every day | ORAL | Status: DC

## 2016-08-15 MED ORDER — HEPARIN SODIUM (PORCINE) 1000 UNIT/ML IJ SOLN
INTRAMUSCULAR | Status: AC
  Filled 2016-08-15: qty 1

## 2016-08-15 MED ORDER — FENTANYL CITRATE (PF) 100 MCG/2ML IJ SOLN
INTRAMUSCULAR | Status: DC | PRN
  Administered 2016-08-15: 50 ug via INTRAVENOUS
  Administered 2016-08-15: 100 ug via INTRAVENOUS
  Administered 2016-08-15: 50 ug via INTRAVENOUS

## 2016-08-15 MED ORDER — ONDANSETRON HCL 4 MG/2ML IJ SOLN
INTRAMUSCULAR | Status: DC | PRN
  Administered 2016-08-15: 4 mg via INTRAVENOUS

## 2016-08-15 MED ORDER — SODIUM CHLORIDE 0.9% FLUSH: 3.0000 mL | INTRAVENOUS | Status: DC | PRN

## 2016-08-15 MED ORDER — HEPARIN SODIUM (PORCINE) 1000 UNIT/ML IJ SOLN
INTRAMUSCULAR | Status: DC | PRN
  Administered 2016-08-15: 1000 [IU] via INTRAVENOUS

## 2016-08-15 MED ORDER — BUPIVACAINE HCL (PF) 0.25 % IJ SOLN
INTRAMUSCULAR | Status: AC
  Filled 2016-08-15: qty 60

## 2016-08-15 MED ORDER — ZOLPIDEM TARTRATE 5 MG PO TABS
5.0000 mg | ORAL_TABLET | Freq: Every evening | ORAL | Status: DC | PRN
  Administered 2016-08-16: 5 mg via ORAL
  Filled 2016-08-15: qty 1

## 2016-08-15 MED ORDER — ADULT MULTIVITAMIN W/MINERALS CH
1.0000 | ORAL_TABLET | Freq: Every day | ORAL | Status: DC
  Filled 2016-08-15: qty 1

## 2016-08-15 MED ORDER — GABAPENTIN 300 MG PO CAPS
300.0000 mg | ORAL_CAPSULE | Freq: Three times a day (TID) | ORAL | Status: DC
  Administered 2016-08-15: 300 mg via ORAL
  Filled 2016-08-15: qty 1

## 2016-08-15 MED ORDER — FLECAINIDE ACETATE 100 MG PO TABS
100.0000 mg | ORAL_TABLET | Freq: Two times a day (BID) | ORAL | Status: DC
  Administered 2016-08-15: 100 mg via ORAL
  Filled 2016-08-15 (×2): qty 1

## 2016-08-15 SURGICAL SUPPLY — 21 items
BAG SNAP BAND KOVER 36X36 (MISCELLANEOUS) ×2 IMPLANT
BLANKET WARM UNDERBOD FULL ACC (MISCELLANEOUS) ×2 IMPLANT
CATH SMTCH THERMOCOOL SF DF (CATHETERS) ×2 IMPLANT
CATH SOUNDSTAR 3D IMAGING (CATHETERS) ×2 IMPLANT
CATH VARIABLE LASSO NAV 2515 (CATHETERS) ×2 IMPLANT
CATH WEBSTER BI DIR CS D-F CRV (CATHETERS) ×2 IMPLANT
COVER SWIFTLINK CONNECTOR (BAG) ×2 IMPLANT
NEEDLE TRANSSEPTAL BRK 98CM (NEEDLE) ×2 IMPLANT
PACK EP LATEX FREE (CUSTOM PROCEDURE TRAY) ×1
PACK EP LF (CUSTOM PROCEDURE TRAY) ×1 IMPLANT
PAD DEFIB LIFELINK (PAD) ×2 IMPLANT
PATCH CARTO3 (PAD) ×2 IMPLANT
SHEATH AGILIS NXT 8.5F 71CM (SHEATH) ×4 IMPLANT
SHEATH AVANTI 11F 11CM (SHEATH) ×2 IMPLANT
SHEATH PINNACLE 7F 10CM (SHEATH) ×2 IMPLANT
SHEATH PINNACLE 8F 10CM (SHEATH) ×4 IMPLANT
SHEATH PINNACLE 9F 10CM (SHEATH) ×4 IMPLANT
SHIELD RADPAD SCOOP 12X17 (MISCELLANEOUS) ×2 IMPLANT
TUBING ART PRESS 72  MALE/FEM (TUBING) ×2
TUBING ART PRESS 72 MALE/FEM (TUBING) ×2 IMPLANT
TUBING SMART ABLATE COOLFLOW (TUBING) ×2 IMPLANT

## 2016-08-15 NOTE — Progress Notes (Signed)
Site area: rt groin   Site Prior to Removal:  Level 0 Pressure Applied For:  15 minutes Manual:   yes Patient Status During Pull:  stable Post Pull Site:  Level  0 Post Pull Instructions Given:  yes Post Pull Pulses Present: yes Dressing Applied:  Small gauze/tegaderm Bedrest begins @  Comments:

## 2016-08-15 NOTE — H&P (Signed)
Chelsea Hancock is a 82 y.o. female with a history of atrial fibrillation and atrial flutter.  She presents for repeat ablation. She previously had a PVI with a CTI line.  On exam, regular rhythm, no murmurs, lungs clear.  Risks and benefits explained.  Risks include but not limited to bleeding, tamponade, heart block, stroke, and damage to surrounding organs.  The patient understands the risks and has agreed to the procedure.  Dinesh Ulysse Curt Bears, MD 08/15/2016 11:28 AM

## 2016-08-15 NOTE — Anesthesia Preprocedure Evaluation (Addendum)
Anesthesia Evaluation  Patient identified by MRN, date of birth, ID band Patient awake    Reviewed: Allergy & Precautions, H&P , Patient's Chart, lab work & pertinent test results, reviewed documented beta blocker date and time   Airway Mallampati: II  TM Distance: >3 FB Neck ROM: full    Dental no notable dental hx. (+) Teeth Intact, Dental Advisory Given   Pulmonary former smoker,    Pulmonary exam normal breath sounds clear to auscultation       Cardiovascular  Rhythm:regular Rate:Normal     Neuro/Psych    GI/Hepatic   Endo/Other    Renal/GU      Musculoskeletal   Abdominal   Peds  Hematology   Anesthesia Other Findings Afib   Reproductive/Obstetrics                            Anesthesia Physical Anesthesia Plan  ASA: III  Anesthesia Plan: General   Post-op Pain Management:    Induction: Intravenous  Airway Management Planned: Oral ETT  Additional Equipment:   Intra-op Plan:   Post-operative Plan: Extubation in OR  Informed Consent: I have reviewed the patients History and Physical, chart, labs and discussed the procedure including the risks, benefits and alternatives for the proposed anesthesia with the patient or authorized representative who has indicated his/her understanding and acceptance.   Dental Advisory Given and Dental advisory given  Plan Discussed with: CRNA and Surgeon  Anesthesia Plan Comments: (  Discussed general anesthesia, including possible nausea, instrumentation of airway, sore throat,pulmonary aspiration, etc. I asked if the were any outstanding questions, or  concerns before we proceeded. )        Anesthesia Quick Evaluation

## 2016-08-15 NOTE — Anesthesia Postprocedure Evaluation (Signed)
Anesthesia Post Note  Patient: Chelsea Hancock  Procedure(s) Performed: Procedure(s) (LRB): Atrial Fibrillation Ablation (N/A)  Patient location during evaluation: Cath Lab Anesthesia Type: General Level of consciousness: awake, awake and alert and oriented Pain management: pain level controlled Vital Signs Assessment: post-procedure vital signs reviewed and stable Respiratory status: spontaneous breathing, nonlabored ventilation and respiratory function stable Cardiovascular status: blood pressure returned to baseline Anesthetic complications: no    Last Vitals:  Vitals:   08/15/16 1645 08/15/16 1655  BP: (!) 130/58 103/60  Pulse: (!) 56 (!) 56  Resp: 14 16  Temp:      Last Pain:  Vitals:   08/15/16 0928  TempSrc: Oral                 Etta Gassett COKER

## 2016-08-15 NOTE — Discharge Instructions (Signed)
No driving for 1 week. No lifting over 5 lbs for 1 week. No vigorous or sexual activity for 1 week. You may return to work on 08/22/16. Keep procedure site clean & dry. If you notice increased pain, swelling, bleeding or pus, call/return!  You may shower, but no soaking baths/hot tubs/pools for 1 week.     You have an appointment set up with the [facility].  Multiple studies have shown that being followed by a dedicated atrial fibrillation clinic in addition to the standard care you receive from your other physicians improves health. We believe that enrollment in the atrial fibrillation clinic will allow [place] to better care for you.   The phone number to the [facility] is [phone]. The clinic is staffed Monday through Friday from 8:30am to 5pm.  Parking Directions: The clinic is located in the Heart and Vascular Building connected to [facility]. 1)From [address] turn on to [address] and go to the 3rd entrance  (Heart and Vascular entrance) on the right. 2)Look to the right for Heart &Vascular Parking Garage. 3)A code for the entrance is required please call the clinic to receive this.   4)Take the elevators to the 1st floor. Registration is in the room with the glass walls at the end of the hallway.  If you have any trouble parking or locating the clinic, please dont hesitate to call [phone].

## 2016-08-15 NOTE — Progress Notes (Signed)
Site area: left groin Site Prior to Removal:  Level 0 Pressure Applied For: 20 minutes Manual:   yes Patient Status During Pull:  stable Post Pull Site:  Level 0 Post Pull Instructions Given:  yes Post Pull Pulses Present: yes Dressing Applied:  tegaderm Bedrest begins @ [id number] Comments:

## 2016-08-15 NOTE — Transfer of Care (Signed)
Immediate Anesthesia Transfer of Care Note  Patient: Chelsea Hancock  Procedure(s) Performed: Procedure(s): Atrial Fibrillation Ablation (N/A)  Patient Location: Cath Lab  Anesthesia Type:General  Level of Consciousness: awake, alert  and oriented  Airway & Oxygen Therapy: Patient Spontanous Breathing and Patient connected to nasal cannula oxygen  Post-op Assessment: Report given to RN and Post -op Vital signs reviewed and stable  Post vital signs: Reviewed and stable  Last Vitals:  Vitals:   08/15/16 0928 08/15/16 1552  BP: 133/78   Pulse: 60   Resp: 18   Temp: 36.8 C 36.3 C    Last Pain:  Vitals:   08/15/16 0928  TempSrc: Oral         Complications: No apparent anesthesia complications

## 2016-08-15 NOTE — Anesthesia Procedure Notes (Addendum)
Procedure Name: Intubation Date/Time: 08/15/2016 12:52 PM Performed by: Ollen Bowl Pre-anesthesia Checklist: Patient identified, Emergency Drugs available, Suction available, Patient being monitored and Timeout performed Patient Re-evaluated:Patient Re-evaluated prior to inductionOxygen Delivery Method: Circle system utilized and Simple face mask Preoxygenation: Pre-oxygenation with 100% oxygen Intubation Type: Combination inhalational/ intravenous induction Ventilation: Mask ventilation without difficulty Laryngoscope Size: Miller and 2 Grade View: Grade II Tube type: Oral Tube size: 7.5 mm Number of attempts: 1 Airway Equipment and Method: Patient positioned with wedge pillow and Stylet Placement Confirmation: ETT inserted through vocal cords under direct vision,  positive ETCO2 and breath sounds checked- equal and bilateral Secured at: 21 cm Tube secured with: Tape Dental Injury: Teeth and Oropharynx as per pre-operative assessment

## 2016-08-16 ENCOUNTER — Encounter (HOSPITAL_COMMUNITY): Payer: Self-pay | Admitting: Physician Assistant

## 2016-08-16 DIAGNOSIS — I480 Paroxysmal atrial fibrillation: Secondary | ICD-10-CM | POA: Diagnosis not present

## 2016-08-16 DIAGNOSIS — I10 Essential (primary) hypertension: Secondary | ICD-10-CM | POA: Diagnosis present

## 2016-08-16 DIAGNOSIS — Z87891 Personal history of nicotine dependence: Secondary | ICD-10-CM | POA: Diagnosis not present

## 2016-08-16 DIAGNOSIS — I4892 Unspecified atrial flutter: Secondary | ICD-10-CM | POA: Diagnosis not present

## 2016-08-16 DIAGNOSIS — Z7901 Long term (current) use of anticoagulants: Secondary | ICD-10-CM | POA: Diagnosis not present

## 2016-08-16 DIAGNOSIS — M797 Fibromyalgia: Secondary | ICD-10-CM | POA: Diagnosis not present

## 2016-08-16 DIAGNOSIS — I4891 Unspecified atrial fibrillation: Secondary | ICD-10-CM | POA: Diagnosis present

## 2016-08-16 DIAGNOSIS — E785 Hyperlipidemia, unspecified: Secondary | ICD-10-CM | POA: Diagnosis not present

## 2016-08-16 DIAGNOSIS — K219 Gastro-esophageal reflux disease without esophagitis: Secondary | ICD-10-CM | POA: Diagnosis not present

## 2016-08-16 DIAGNOSIS — I2510 Atherosclerotic heart disease of native coronary artery without angina pectoris: Secondary | ICD-10-CM | POA: Diagnosis not present

## 2016-08-16 DIAGNOSIS — F418 Other specified anxiety disorders: Secondary | ICD-10-CM | POA: Diagnosis not present

## 2016-08-16 MED ORDER — ALUM & MAG HYDROXIDE-SIMETH 200-200-20 MG/5ML PO SUSP
30.0000 mL | Freq: Once | ORAL | Status: AC
  Administered 2016-08-16: 30 mL via ORAL
  Filled 2016-08-16 (×2): qty 30

## 2016-08-16 MED ORDER — NITROGLYCERIN 0.4 MG SL SUBL
SUBLINGUAL_TABLET | SUBLINGUAL | Status: AC
  Administered 2016-08-16: 0.4 mg
  Filled 2016-08-16: qty 1

## 2016-08-16 MED ORDER — NITROGLYCERIN 0.4 MG SL SUBL: 0.4000 mg | SUBLINGUAL_TABLET | SUBLINGUAL | Status: DC | PRN

## 2016-08-16 NOTE — Discharge Summary (Signed)
Discharge Summary    Patient ID: Chelsea Hancock,  MRN: [id number], DOB/AGE: 01/19/1934 82 y.o.  Admit date: 08/15/2016 Discharge date: 08/16/2016  Primary Care Provider: [name] Primary Cardiologist: Dr. [name] Dr. Curt Bears   Discharge Diagnoses    Principal Problem:   Atrial fibrillation and flutter [facility]) Active Problems:   GERD   Fibromyalgia   Depression with anxiety   Dyslipidemia   Chronic anticoagulation - Eliquis, CHADS2VASC=2   HTN (hypertension)   Allergies Allergies  Allergen Reactions  . Cefuroxime Axetil Anaphylaxis  . Macrodantin Anaphylaxis  . Alendronate Sodium Other (See Comments)    Severe chest pain similar to heart attack   . Codeine Nausea And Vomiting  . Demerol [Meperidine] Other (See Comments)    hallucinations  . Meperidine Hcl Nausea And Vomiting  . Other Other (See Comments)    Hismanal and Maprobamate  portobello mushrooms - diarrhea  . Sulfonamide Derivatives Hives, Itching and Swelling     History of Present Illness     Chelsea Hancock is a 82 y.o. female with a hx of HTN, HLD, and recurrent atrial fibrillation/flutter on Eliquis who presented to [facility] on 08/15/16 for repeat atrial fib/flutter ablation.  She has been admitted several times with symptomatic atrial flutter/fib and atrial tach. She has a history of post termination bradycardia. She underwent PVI ablation and CTI line for atrial fibrillation 07/2015 With Dr. Curt Bears.  She has failed both flecainide and amiodarone after her ablation with breakthrough atrial fib/flutter. She recently wore a 30 day event monitor which did show episodes of afib with the early morning. She was continued on Flecainide 100mg  BID. She was recently seen back in the office by Dr. Curt Bears who planned for repeat catheter ablation for recurrent symptomatic atrial fibrillation. This was planned for 08/15/16.  Hospital Course     Consultants: none  PAF: she  underwent successful PVI with Dr. Curt Bears. She Lindzee Gouge be continued on Flecainide 100mg  BID, bisoprolol 5mg  daily and Eliquis 5mg  BID for CHADSVASC of at least 4 (HTN, age, non obstructive vasc dz).   HTN: Controlled.   HLD:  Continue statin and Zetia  CAD:  Mild LAD plaque noted on Coronary CTA in 11/16.  No exertional chest pain. Not on ASA as she is on Eliquis. Continue statin.     The patient has had an uncomplicated hospital course and is recovering well. The  femoral catheter sites are stable She has been seen by Dr. Curt Bears today and deemed ready for discharge home. All follow-up appointments have been scheduled. Discharge medications are listed below.  _____________  Discharge Vitals Blood pressure (!) 102/42, pulse 63, temperature 98 F (36.7 C), temperature source Oral, resp. rate 20, height 5\' 3"  (1.6 m), weight 156 lb 1.4 oz (70.8 kg), SpO2 92 %.  Filed Weights   08/15/16 0928 08/16/16 0137  Weight: 147 lb (66.7 kg) 156 lb 1.4 oz (70.8 kg)   GEN: Well nourished, well developed, in no acute distress  HEENT: normal  Neck: no JVD, no masses Cardiac: Normal S1/S2,  RRR; no murmurs, rubs, or gallops, no edema    Respiratory:  clear to auscultation bilaterally; no wheezing, rhonchi or rales GI: soft, nontender, nondistended MS: no deformity or atrophy  Skin: warm and dry Neuro: No focal deficits  Psych: Alert and oriented x 3, normal affect  Labs & Radiologic Studies    Dg Chest 2 View  Result Date: 07/19/2016 CLINICAL DATA:  Tachycardia EXAM: CHEST  2 VIEW  COMPARISON:  12/23/2015 FINDINGS: Mild scarring in the left mid lung. No focal consolidation. No pleural effusion or pneumothorax. The heart is normal size. Cervical spine fixation hardware. Mild degenerative changes of the upper thoracic spine. IMPRESSION: No evidence of acute cardiopulmonary disease. Electronically Signed   By: Julian Hy M.D.   On: 07/19/2016 20:34   Ct Cardiac Morph/pulm Vein W/cm&w/o Ca  Score  Addendum Date: 08/10/2016   ADDENDUM REPORT: 08/10/2016 10:37 CLINICAL DATA:  Pre-atrial fibrillation ablation EXAM: Cardiac CTA MEDICATIONS: Sub lingual nitro. 4mg  and lopressor 5mg  IV TECHNIQUE: The patient was scanned on a Philips [id number] slice scanner. Gantry rotation speed was 270 msecs. Collimation was .72mm. A 100 kV prospective scan was triggered in the descending thoracic aorta at 111 HU's with 5% padding centered around 78% of the R-R interval. Average HR during the scan was 65 bpm. The 3D data set was interpreted on a dedicated work station using MPR, MIP and VRT modes. A total of 80cc of contrast was used. FINDINGS: Non-cardiac: See separate report from [facility]. Calcium Score:  11.8 Agatston units. Coronary Arteries: Right dominant with no anomalies LM:  No plaque or stenosis. LAD system:  Mixed plaque in proximal and mid LAD, mild stenosis. Circumflex system: Moderate ramus with no significant disease. No plaque or stenosis in the LCx or the large PLOM. RCA:  No plaque or stenosis. The LA appendage was visualized, no thrombus noted. Pulmonary veins drain normally to the left atrium. LUPV:  18 x 12 mm LLPV:  15 x 11 mm RUPV:  15 x 11 mm RMPV:  10 x 8 mm RLPV:  14 x 11 mm (close proximity to descending thoracic aorta). IMPRESSION: 1. Coronary artery calcium score 11.8 Agatston units. This places the patient in the 29th percentile for age and gender, suggesting low risk for future cardiac events. 2.  No LA appendage thrombus. 3.  Pulmonary veins as noted above. 4.  Nonobstructive coronary disease. Dalton Mclean Electronically Signed   By: Loralie Champagne M.D.   On: 08/10/2016 10:37   Result Date: 08/10/2016 EXAM: OVER-READ INTERPRETATION  CT CHEST The following report is an over-read performed by radiologist Dr. Collene Leyden [facility] on 08/08/2016. This over-read does not include interpretation of cardiac or coronary anatomy or pathology. The coronary CTA interpretation  by the cardiologist is attached. COMPARISON:  None. FINDINGS: Visualized lung fields are clear. No adenopathy in the visualized lower mediastinum or hila. No pleural effusions. Visualized upper abdomen unremarkable. No acute bony abnormality. IMPRESSION: No acute or significant extracardiac abnormality. Electronically Signed: By: Rolm Baptise M.D. On: 08/08/2016 13:17     Diagnostic Studies/Procedures    08/15/16 Atrial Fibrillation Ablation  Conclusion  SURGEON:  Chaniece Barbato, MD  PREPROCEDURE DIAGNOSES: 1. Paroxysmal atrial fibrillation.  CONCLUSIONS: 1. Sinus rhythm upon presentation.   2. Successful electrical isolation and anatomical encircling of all four pulmonary veins with radiofrequency current. 3. No early apparent complications     _____________    Disposition   Pt is being discharged home today in good condition.  Follow-up Plans & Appointments    Follow-up Information    [facility] up on 09/12/2016.   Specialty:  Cardiology Why:  11:30AM Contact information: [address] [id number] [place] [phone]       Soriyah Osberg Meredith Leeds, MD Follow up on 11/18/2016.   Specialty:  Cardiology Why:  8:45AM Contact information: [address] [place] [address]  [phone]            Discharge Medications     Medication List    TAKE these medications   atorvastatin 10 MG tablet Commonly known as:  LIPITOR Take 10 mg by mouth daily.   Biotin 5000 MCG Tabs Take 5,000 mcg by mouth daily.   bisoprolol 5 MG tablet Commonly known as:  ZEBETA Take 5 mg by mouth daily. Ok to take extra tablet as needed for palpitations   Calcium Carb-Cholecalciferol 600-200 MG-UNIT Tabs Take 1 tablet by mouth daily.   CENTRUM SILVER ULTRA WOMENS Tabs Take 1 tablet by mouth daily.   ELIQUIS 5 MG Tabs tablet Generic drug:  apixaban TAKE 1 TABLET(5 MG) BY MOUTH TWICE DAILY    esomeprazole 40 MG capsule Commonly known as:  NEXIUM Take 1 capsule (40 mg total) by mouth daily at 12 noon.   eszopiclone 2 MG Tabs tablet Commonly known as:  LUNESTA Take 1 tablet (2 mg total) by mouth at bedtime as needed for sleep. Take immediately before bedtime   flecainide 100 MG tablet Commonly known as:  TAMBOCOR Take 1 tablet (100 mg total) by mouth 2 (two) times daily.   gabapentin 600 MG tablet Commonly known as:  NEURONTIN TAKE 1 TABLET(600 MG) BY MOUTH TWICE DAILY   MEGARED OMEGA-3 KRILL OIL 500 MG Caps Take 1 tablet by mouth daily.   Melatonin 5 MG Caps Take 5 mg by mouth at bedtime.   nortriptyline 50 MG capsule Commonly known as:  PAMELOR Take 100 mg by mouth at bedtime.   RECLAST IV Inject into the vein. yearly   ZETIA 10 MG tablet Generic drug:  ezetimibe TAKE 1 TABLET BY MOUTH EVERY DAY         Outstanding Labs/Studies   none  Duration of Discharge Encounter   Greater than 30 minutes including physician time.  Signed, Angelena Form PA-C 08/16/2016, 8:33 AM   I have seen and examined this patient with Angelena Form.  Agree with above, note added to reflect my findings.  On exam, regular rhythm, no murmurs, lungs clear. Had AF ablation yesterday with resumption of sinus rhythm overnight.  Plan for discharge today with follow up in AF clinic in one month.    Parris Signer M. Tomisha Reppucci MD 08/16/2016 9:03 AM

## 2016-08-16 NOTE — Progress Notes (Signed)
Patient stated she wanted to take her own medications when she got home. No am meds given

## 2016-08-16 NOTE — Progress Notes (Signed)
Pt c/o dull chest pain 4/10. V/S stable. EKG done. Dr Elson Areas informed. Ntg 0.4 mg SL x 1 dose given as ordered with relief. Will continue to monitor pt.

## 2016-08-18 ENCOUNTER — Encounter (HOSPITAL_COMMUNITY): Payer: Self-pay | Admitting: Cardiology

## 2016-08-18 ENCOUNTER — Ambulatory Visit (HOSPITAL_COMMUNITY)
Admission: RE | Admit: 2016-08-18 | Discharge: 2016-08-18 | Disposition: A | Discharge status: Home / Self Care | Payer: Medicare Other | Source: Ambulatory Visit | Attending: Nurse Practitioner | Admitting: Nurse Practitioner

## 2016-08-18 ENCOUNTER — Telehealth: Payer: Self-pay | Admitting: Cardiology

## 2016-08-18 VITALS — BP 136/90 | HR 104 | Ht 63.0 in | Wt 148.0 lb

## 2016-08-18 DIAGNOSIS — Z7901 Long term (current) use of anticoagulants: Secondary | ICD-10-CM | POA: Diagnosis not present

## 2016-08-18 DIAGNOSIS — Z79899 Other long term (current) drug therapy: Secondary | ICD-10-CM | POA: Insufficient documentation

## 2016-08-18 DIAGNOSIS — Z87891 Personal history of nicotine dependence: Secondary | ICD-10-CM | POA: Insufficient documentation

## 2016-08-18 DIAGNOSIS — I4891 Unspecified atrial fibrillation: Secondary | ICD-10-CM | POA: Diagnosis present

## 2016-08-18 DIAGNOSIS — I517 Cardiomegaly: Secondary | ICD-10-CM | POA: Insufficient documentation

## 2016-08-18 DIAGNOSIS — I480 Paroxysmal atrial fibrillation: Secondary | ICD-10-CM

## 2016-08-18 DIAGNOSIS — I471 Supraventricular tachycardia: Secondary | ICD-10-CM | POA: Diagnosis not present

## 2016-08-18 MED ORDER — BISOPROLOL FUMARATE 5 MG PO TABS: ORAL_TABLET | ORAL | 3 refills | Status: DC

## 2016-08-18 NOTE — Telephone Encounter (Signed)
Pt's daughter states pt c/o heart racing. Pt s/p ablation (repeat) 08/15/16.  She reports BP 147/93 and HR 103.  She states pt denies chest pain and SOB.  I advised episodes of A-Fib are common for up to 3 months after ablation.  I advised to take AM medication, practice deep breathing, and relaxation.  She states she feels like Mother is having anxiety because she (dtr) is going back home today.   I advised if no better by lunch time call back and we will get appt at afib clinic this afternoon. She voiced understanding and agreed with plan.

## 2016-08-18 NOTE — Progress Notes (Signed)
Patient ID: Chelsea Hancock, female   DOB: 1933-10-08, 82 y.o.   MRN: [id number]     Primary Care Physician: [name], MD Referring Physician: Dr. Yehuda Savannah MARVELYN PLATER is a 82 y.o. female with a h/o afib/flutter that had afib/flutter ablation with Dr. Curt Bears 11/18. She was also started on amiodarone at that time, which she failed and was placed on amiodrone. She continued to have PAF and had second ablation 08/15/16.She called the office this am because she was found to have an elevated heart rate around 100 bpm and BP was elevated. Her BP improved after meds this am but HR stayed elevated. EKG shows possible ectopic atrial tachycardia at 104 bpm..  Denies any groin issues or dysphagia.  Continues with blood thinner. The daughter states that she believes pt got anxious because  the daughter was to return to her home in [place] this am.  Today, she denies symptoms of palpitations, chest pain, shortness of breath, orthopnea, PND, lower extremity edema, dizziness, presyncope, syncope, or neurologic sequela. The patient is tolerating medications without difficulties and is otherwise without complaint today.   Past Medical History:  Diagnosis Date  . Atrial fibrillation and flutter ([place])    a. s/p PVI ablation in 07/2015 with continue PAF  b. s/p repeat ablation on 08/15/16. continued on Flecainide 100mg  BID  . Carotid bruit    Right  . Chronic back pain   . Depression with anxiety 03/29/2013  . Diverticulosis   . Fibromyalgia   . Foot drop, right   . GERD (gastroesophageal reflux disease)   . Hemorrhoids   . HTN (hypertension)   . Hyperlipidemia   . IBS (irritable bowel syndrome)   . Memory loss 02/11/2015  . Onychomycosis 07/03/2013  . Osteoporosis   . Sigmoid colon ulcer 03/23/2012   Past Surgical History:  Procedure Laterality Date  . ABDOMINAL HYSTERECTOMY  1981  . BACK SURGERY  1997   reconstructive lower back surgery  . CARDIOVERSION N/A 07/01/2016   Procedure: CARDIOVERSION;  Surgeon: Thayer Headings, MD;  Location: [facility];  Service: Cardiovascular;  Laterality: N/A;  . CARPAL TUNNEL RELEASE Left 2012   ulnar nerve release at elbow  . CERVICAL FUSION  2003, 2005, 2013  . COLONOSCOPY    . ELECTROPHYSIOLOGIC STUDY N/A 08/10/2015   Procedure: Afib;  Surgeon: Will Meredith Leeds, MD;  Location: [facility];  Service: Cardiovascular;  Laterality: N/A;  . ELECTROPHYSIOLOGIC STUDY N/A 08/15/2016   Procedure: Atrial Fibrillation Ablation;  Surgeon: Will Meredith Leeds, MD;  Location: [facility];  Service: Cardiovascular;  Laterality: N/A;  . EYE SURGERY  2015   b/l cataracts removed  . left elbow surgery  06/03/11   cyst removed  . LUMBAR LAMINECTOMY  1960   x 2  . POSTERIOR CERVICAL FUSION/FORAMINOTOMY  08/03/2012   Procedure: POSTERIOR CERVICAL FUSION/FORAMINOTOMY LEVEL 5;  Surgeon: [name], MD;  Location: [facility];  Service: Neurosurgery;  Laterality: N/A;  Cervical four to Thoracic two Posterior cervical decompression with Facet and Pedicle screw fixation  . TONSILLECTOMY  1943  . TOTAL HIP ARTHROPLASTY Left 2012  . TOTAL KNEE ARTHROPLASTY Right 11/20/2014   Procedure: RIGHT TOTAL KNEE ARTHROPLASTY;  Surgeon: [name], MD;  Location: [facility];  Service: Orthopedics;  Laterality: Right;  . veins stripped  1970  . WRIST SURGERY Right 1990's   cyst removed    Current Outpatient Prescriptions  Medication Sig Dispense Refill  . atorvastatin (LIPITOR) 10  MG tablet Take 10 mg by mouth daily.    . Biotin 5000 MCG TABS Take 5,000 mcg by mouth daily.    . bisoprolol (ZEBETA) 5 MG tablet Take 1 tablet by mouth in the morning and 1/2 tablet in the evening. 45 tablet 3  . Calcium Carb-Cholecalciferol 600-200 MG-UNIT TABS Take 1 tablet by mouth daily.    Marland Kitchen ELIQUIS 5 MG TABS tablet TAKE 1 TABLET(5 MG) BY MOUTH TWICE DAILY 60 tablet 11  . esomeprazole (NEXIUM) 40 MG capsule Take 1 capsule (40 mg total) by mouth  daily at 12 noon. 90 capsule 3  . eszopiclone (LUNESTA) 2 MG TABS tablet Take 1 tablet (2 mg total) by mouth at bedtime as needed for sleep. Take immediately before bedtime 30 tablet 2  . flecainide (TAMBOCOR) 100 MG tablet Take 1 tablet (100 mg total) by mouth 2 (two) times daily. 180 tablet 2  . gabapentin (NEURONTIN) 600 MG tablet TAKE 1 TABLET(600 MG) BY MOUTH TWICE DAILY 180 tablet 0  . MEGARED OMEGA-3 KRILL OIL 500 MG CAPS Take 1 tablet by mouth daily.    . Melatonin 5 MG CAPS Take 5 mg by mouth at bedtime.     . Multiple Vitamins-Minerals (CENTRUM SILVER ULTRA WOMENS) TABS Take 1 tablet by mouth daily.    . nortriptyline (PAMELOR) 50 MG capsule Take 100 mg by mouth at bedtime.    Marland Kitchen ZETIA 10 MG tablet TAKE 1 TABLET BY MOUTH EVERY DAY 30 tablet 6  . Zoledronic Acid (RECLAST IV) Inject into the vein. yearly     No current facility-administered medications for this encounter.     Allergies  Allergen Reactions  . Cefuroxime Axetil Anaphylaxis  . Macrodantin Anaphylaxis  . Alendronate Sodium Other (See Comments)    Severe chest pain similar to heart attack   . Codeine Nausea And Vomiting  . Demerol [Meperidine] Other (See Comments)    hallucinations  . Meperidine Hcl Nausea And Vomiting  . Other Other (See Comments)    Hismanal and Maprobamate  portobello mushrooms - diarrhea  . Sulfonamide Derivatives Hives, Itching and Swelling    Social History   Social History  . Marital status: Divorced    Spouse name: N/A  . Number of children: N/A  . Years of education: N/A   Occupational History  . Not on file.   Social History Main Topics  . Smoking status: Former Smoker    Packs/day: 0.25    Years: 4.00    Types: Cigarettes  . Smokeless tobacco: Never Used     Comment: quit 20+yrs ago  . Alcohol use 0.0 oz/week     Comment: WINE WITH DINNER. 2 GLASSES RED WINE  . Drug use: No  . Sexual activity: Yes    Partners: Male   Other Topics Concern  . Not on file   Social  History Narrative  . No narrative on file    Family History  Problem Relation Age of Onset  . Bipolar disorder Mother   . Stomach cancer Mother   . Mental illness Mother   . Anxiety disorder Mother   . Stroke Father   . Colon cancer Father   . HIV Son     Aids  . Stomach cancer Maternal Grandfather   . Stomach cancer Paternal Grandmother   . Heart disease Maternal Grandmother   . Throat cancer Maternal Uncle     larynx  . Anesthesia problems Neg Hx   . Diabetes Neg Hx  ROS- All systems are reviewed and negative except as per the HPI above  Physical Exam: Vitals:   08/18/16 1431  BP: 136/90  Pulse: (!) 104  Weight: 148 lb (67.1 kg)  Height: 5\' 3"  (1.6 m)    GEN- The patient is well appearing, alert and oriented x 3 today.   Head- normocephalic, atraumatic Eyes-  Sclera clear, conjunctiva pink Ears- hearing intact Oropharynx- clear Neck- supple, no JVP Lymph- no cervical lymphadenopathy Lungs- Clear to ausculation bilaterally, normal work of breathing Heart- Regular rate and rhythm, no murmurs, rubs or gallops, PMI not laterally displaced GI- soft, NT, ND, + BS Extremities- no clubbing, cyanosis, or edema MS- no significant deformity or atrophy Skin- no rash or lesion Psych- euthymic mood, full affect Neuro- strength and sensation are intact  EKG- Possible ectopic atrial tachycardia,at 104 bpm,  pr int 96 ms, qrs int 114 ms, qtc 489 ms Epic records reviewed  Assessment and Plan: 1. afib S/p ablation Possible ectopic atrial tachycardia Continue  flecainide at 100 mg bid Increase bisoprolol to an extra 1/2 tab at hs, continue 5 mg in am Continue apixaban 5 mg bid  F/u in afib clinic Wed pm   Butch Penny C. Maurizio Geno, [facility] [address] [place], [place] [address] [phone]

## 2016-08-18 NOTE — Patient Instructions (Addendum)
Your physician has recommended you make the following change in your medication:  1)Increase bisoprolol to 5mg  in the morning and 2.5mg  in the evening

## 2016-08-18 NOTE — Telephone Encounter (Signed)
New message  Pt had cardiac ablation on Friday  Heart is racing this morning  147-93 Heart rate 103 @8 :05

## 2016-08-18 NOTE — Telephone Encounter (Signed)
l called to check in on patient, spoke with patient and her daughter. Daughter reports BP now 115/76 and HR 97. Pt reports still feeling anxious and "feels like she is in afib".  I made appt at [place] clinic today at 1430 w/ Roderic Palau for reassurance. Pt's daughter voiced understanding and agreed with plan.

## 2016-08-20 ENCOUNTER — Encounter (HOSPITAL_COMMUNITY): Payer: Self-pay | Admitting: Nurse Practitioner

## 2016-08-20 ENCOUNTER — Ambulatory Visit (HOSPITAL_COMMUNITY)
Admission: RE | Admit: 2016-08-20 | Discharge: 2016-08-20 | Disposition: A | Discharge status: Home / Self Care | Payer: Medicare Other | Source: Ambulatory Visit | Attending: Nurse Practitioner | Admitting: Nurse Practitioner

## 2016-08-20 VITALS — BP 124/64 | HR 62 | Ht 63.0 in | Wt 148.0 lb

## 2016-08-20 DIAGNOSIS — I480 Paroxysmal atrial fibrillation: Secondary | ICD-10-CM | POA: Diagnosis not present

## 2016-08-20 DIAGNOSIS — I440 Atrioventricular block, first degree: Secondary | ICD-10-CM | POA: Diagnosis not present

## 2016-08-20 DIAGNOSIS — Z882 Allergy status to sulfonamides status: Secondary | ICD-10-CM | POA: Insufficient documentation

## 2016-08-20 DIAGNOSIS — Z9889 Other specified postprocedural states: Secondary | ICD-10-CM | POA: Insufficient documentation

## 2016-08-20 DIAGNOSIS — Z87891 Personal history of nicotine dependence: Secondary | ICD-10-CM | POA: Diagnosis not present

## 2016-08-20 DIAGNOSIS — I481 Persistent atrial fibrillation: Secondary | ICD-10-CM | POA: Diagnosis not present

## 2016-08-20 DIAGNOSIS — I4819 Other persistent atrial fibrillation: Secondary | ICD-10-CM

## 2016-08-20 DIAGNOSIS — I471 Supraventricular tachycardia: Secondary | ICD-10-CM | POA: Diagnosis not present

## 2016-08-20 DIAGNOSIS — I4891 Unspecified atrial fibrillation: Secondary | ICD-10-CM | POA: Diagnosis present

## 2016-08-20 DIAGNOSIS — Z79899 Other long term (current) drug therapy: Secondary | ICD-10-CM | POA: Diagnosis not present

## 2016-08-20 NOTE — Progress Notes (Signed)
Patient ID: Chelsea Hancock, female   DOB: Mar 13, 1934, 82 y.o.   MRN: [id number]     Primary Care Physician: [name], MD Referring Physician: Dr. Yehuda Savannah Chelsea Hancock is a 82 y.o. female with a h/o afib/flutter that had afib/flutter ablation with Dr. Curt Bears 11/18. She was also started on amiodarone at that time, which she failed and was placed on amiodrone. She continued to have PAF and had second ablation 08/15/16.She called the office this am because she was found to have an elevated heart rate around 100 bpm and BP was elevated. Her BP improved after meds this am but HR stayed elevated. EKG shows possible ectopic atrial tachycardia at 104 bpm..  Denies any groin issues or dysphagia.  Continues with blood thinner. The daughter states that she believes pt got anxious because  the daughter was to return to her home in [place] this am.  F/u in [place] clinic 11/29, she has returned to [place]. She took one extra BB dose and returned to rhythm. She is still a little weak from the procedure but no dysphagia or groin issues. She is here with daughter and explains her mother is very anxious especially at night. The pt does live alone and the daughter plans to go back to [place] soon.  Today, she denies symptoms of palpitations, chest pain, shortness of breath, orthopnea, PND, lower extremity edema, dizziness, presyncope, syncope, or neurologic sequela. The patient is tolerating medications without difficulties and is otherwise without complaint today.   Past Medical History:  Diagnosis Date  . Atrial fibrillation and flutter ([place])    a. s/p PVI ablation in 07/2015 with continue PAF  b. s/p repeat ablation on 08/15/16. continued on Flecainide 100mg  BID  . Carotid bruit    Right  . Chronic back pain   . Depression with anxiety 03/29/2013  . Diverticulosis   . Fibromyalgia   . Foot drop, right   . GERD (gastroesophageal reflux disease)   . Hemorrhoids   . HTN (hypertension)   .  Hyperlipidemia   . IBS (irritable bowel syndrome)   . Memory loss 02/11/2015  . Onychomycosis 07/03/2013  . Osteoporosis   . Sigmoid colon ulcer 03/23/2012   Past Surgical History:  Procedure Laterality Date  . ABDOMINAL HYSTERECTOMY  1981  . BACK SURGERY  1997   reconstructive lower back surgery  . CARDIOVERSION N/A 07/01/2016   Procedure: CARDIOVERSION;  Surgeon: Thayer Headings, MD;  Location: [facility];  Service: Cardiovascular;  Laterality: N/A;  . CARPAL TUNNEL RELEASE Left 2012   ulnar nerve release at elbow  . CERVICAL FUSION  2003, 2005, 2013  . COLONOSCOPY    . ELECTROPHYSIOLOGIC STUDY N/A 08/10/2015   Procedure: Afib;  Surgeon: Will Meredith Leeds, MD;  Location: [facility];  Service: Cardiovascular;  Laterality: N/A;  . ELECTROPHYSIOLOGIC STUDY N/A 08/15/2016   Procedure: Atrial Fibrillation Ablation;  Surgeon: Will Meredith Leeds, MD;  Location: [facility];  Service: Cardiovascular;  Laterality: N/A;  . EYE SURGERY  2015   b/l cataracts removed  . left elbow surgery  06/03/11   cyst removed  . LUMBAR LAMINECTOMY  1960   x 2  . POSTERIOR CERVICAL FUSION/FORAMINOTOMY  08/03/2012   Procedure: POSTERIOR CERVICAL FUSION/FORAMINOTOMY LEVEL 5;  Surgeon: [name], MD;  Location: [facility];  Service: Neurosurgery;  Laterality: N/A;  Cervical four to Thoracic two Posterior cervical decompression with Facet and Pedicle screw fixation  . TONSILLECTOMY  1943  .  TOTAL HIP ARTHROPLASTY Left 2012  . TOTAL KNEE ARTHROPLASTY Right 11/20/2014   Procedure: RIGHT TOTAL KNEE ARTHROPLASTY;  Surgeon: [name], MD;  Location: [facility];  Service: Orthopedics;  Laterality: Right;  . veins stripped  1970  . WRIST SURGERY Right 1990's   cyst removed    Current Outpatient Prescriptions  Medication Sig Dispense Refill  . atorvastatin (LIPITOR) 10 MG tablet Take 10 mg by mouth daily.    . Biotin 5000 MCG TABS Take 5,000 mcg by mouth daily.    . bisoprolol (ZEBETA) 5  MG tablet Take 1 tablet by mouth in the morning and 1/2 tablet in the evening. 45 tablet 3  . Calcium Carb-Cholecalciferol 600-200 MG-UNIT TABS Take 1 tablet by mouth daily.    Marland Kitchen ELIQUIS 5 MG TABS tablet TAKE 1 TABLET(5 MG) BY MOUTH TWICE DAILY 60 tablet 11  . esomeprazole (NEXIUM) 40 MG capsule Take 1 capsule (40 mg total) by mouth daily at 12 noon. 90 capsule 3  . eszopiclone (LUNESTA) 2 MG TABS tablet Take 1 tablet (2 mg total) by mouth at bedtime as needed for sleep. Take immediately before bedtime 30 tablet 2  . flecainide (TAMBOCOR) 100 MG tablet Take 1 tablet (100 mg total) by mouth 2 (two) times daily. 180 tablet 2  . gabapentin (NEURONTIN) 600 MG tablet TAKE 1 TABLET(600 MG) BY MOUTH TWICE DAILY 180 tablet 0  . MEGARED OMEGA-3 KRILL OIL 500 MG CAPS Take 1 tablet by mouth daily.    . Melatonin 5 MG CAPS Take 5 mg by mouth at bedtime.     . Multiple Vitamins-Minerals (CENTRUM SILVER ULTRA WOMENS) TABS Take 1 tablet by mouth daily.    . nortriptyline (PAMELOR) 50 MG capsule Take 100 mg by mouth at bedtime.    Marland Kitchen ZETIA 10 MG tablet TAKE 1 TABLET BY MOUTH EVERY DAY 30 tablet 6  . Zoledronic Acid (RECLAST IV) Inject into the vein. yearly     No current facility-administered medications for this encounter.     Allergies  Allergen Reactions  . Cefuroxime Axetil Anaphylaxis  . Macrodantin Anaphylaxis  . Alendronate Sodium Other (See Comments)    Severe chest pain similar to heart attack   . Codeine Nausea And Vomiting  . Demerol [Meperidine] Other (See Comments)    hallucinations  . Meperidine Hcl Nausea And Vomiting  . Other Other (See Comments)    Hismanal and Maprobamate  portobello mushrooms - diarrhea  . Sulfonamide Derivatives Hives, Itching and Swelling    Social History   Social History  . Marital status: Divorced    Spouse name: N/A  . Number of children: N/A  . Years of education: N/A   Occupational History  . Not on file.   Social History Main Topics  . Smoking  status: Former Smoker    Packs/day: 0.25    Years: 4.00    Types: Cigarettes  . Smokeless tobacco: Never Used     Comment: quit 20+yrs ago  . Alcohol use 0.0 oz/week     Comment: WINE WITH DINNER. 2 GLASSES RED WINE  . Drug use: No  . Sexual activity: Yes    Partners: Male   Other Topics Concern  . Not on file   Social History Narrative  . No narrative on file    Family History  Problem Relation Age of Onset  . Bipolar disorder Mother   . Stomach cancer Mother   . Mental illness Mother   . Anxiety disorder Mother   .  Stroke Father   . Colon cancer Father   . HIV Son     Aids  . Stomach cancer Maternal Grandfather   . Stomach cancer Paternal Grandmother   . Heart disease Maternal Grandmother   . Throat cancer Maternal Uncle     larynx  . Anesthesia problems Neg Hx   . Diabetes Neg Hx     ROS- All systems are reviewed and negative except as per the HPI above  Physical Exam: Vitals:   08/20/16 1443  BP: 124/64  Pulse: 62  Weight: 148 lb (67.1 kg)  Height: 5\' 3"  (1.6 m)    GEN- The patient is well appearing, alert and oriented x 3 today.   Head- normocephalic, atraumatic Eyes-  Sclera clear, conjunctiva pink Ears- hearing intact Oropharynx- clear Neck- supple, no JVP Lymph- no cervical lymphadenopathy Lungs- Clear to ausculation bilaterally, normal work of breathing Heart- Regular rate and rhythm, no murmurs, rubs or gallops, PMI not laterally displaced GI- soft, NT, ND, + BS Extremities- no clubbing, cyanosis, or edema MS- no significant deformity or atrophy Skin- no rash or lesion Psych- euthymic mood, full affect Neuro- strength and sensation are intact  EKG- SR with first degree AV block at 62 bpm Epic records reviewed  Assessment and Plan: 1. afib S/p ablation Now back in SR from atrial tachycardia on Monday Continue  flecainide at 100 mg bid Can take an extra bisoprolol  1/2 tab  If goes back into irregular rhythm Continue apixaban 5 mg  bid Reassured  F/u in afib clinic 12/22 F/u with Dr. Curt Bears 2/27   Geroge Baseman. [place], [facility] [address] [place], [place] [address] [phone]

## 2016-08-20 NOTE — Consult Note (Signed)
            [facility] CM Primary Care Navigator  08/20/2016  BRENDALYN NORWAY 1933-10-14 [id number]     Wentto see patientto identify possible discharge needs butpatient was already discharged.  Primary care provider's office contacted(Bonnie)to notify of patient's discharge to home and need for post hospital follow-up and transition of care. Made aware to refer patient to [facility] care management if deemed appropriate for services.   For additional questions please contact:  Edwena Felty A. Josha Weekley, BSN, RN-BC [facility] PRIMARY CARE Navigator Cell: [phone]

## 2016-09-10 DIAGNOSIS — J069 Acute upper respiratory infection, unspecified: Secondary | ICD-10-CM | POA: Diagnosis not present

## 2016-09-12 ENCOUNTER — Ambulatory Visit (HOSPITAL_COMMUNITY)
Admission: RE | Admit: 2016-09-12 | Discharge: 2016-09-12 | Disposition: A | Discharge status: Home / Self Care | Payer: Medicare Other | Source: Ambulatory Visit | Attending: Nurse Practitioner | Admitting: Nurse Practitioner

## 2016-09-12 ENCOUNTER — Encounter (HOSPITAL_COMMUNITY): Payer: Self-pay | Admitting: Nurse Practitioner

## 2016-09-12 VITALS — BP 110/64 | HR 58 | Ht 63.0 in | Wt 145.0 lb

## 2016-09-12 DIAGNOSIS — R9431 Abnormal electrocardiogram [ECG] [EKG]: Secondary | ICD-10-CM | POA: Insufficient documentation

## 2016-09-12 DIAGNOSIS — R001 Bradycardia, unspecified: Secondary | ICD-10-CM | POA: Diagnosis not present

## 2016-09-12 DIAGNOSIS — I440 Atrioventricular block, first degree: Secondary | ICD-10-CM | POA: Insufficient documentation

## 2016-09-12 DIAGNOSIS — I4891 Unspecified atrial fibrillation: Secondary | ICD-10-CM | POA: Diagnosis present

## 2016-09-12 DIAGNOSIS — I481 Persistent atrial fibrillation: Secondary | ICD-10-CM | POA: Insufficient documentation

## 2016-09-12 DIAGNOSIS — Z9889 Other specified postprocedural states: Secondary | ICD-10-CM | POA: Insufficient documentation

## 2016-09-12 DIAGNOSIS — Z87891 Personal history of nicotine dependence: Secondary | ICD-10-CM | POA: Insufficient documentation

## 2016-09-12 DIAGNOSIS — I4819 Other persistent atrial fibrillation: Secondary | ICD-10-CM

## 2016-09-12 DIAGNOSIS — Z79899 Other long term (current) drug therapy: Secondary | ICD-10-CM | POA: Diagnosis not present

## 2016-09-12 NOTE — Progress Notes (Signed)
Patient ID: Chelsea Hancock, female   DOB: 1934/08/14, 82 y.o.   MRN: [id number]     Primary Care Physician: Chelsea Argyle, MD Referring Physician: Dr. Yehuda Hancock Chelsea Hancock is a 82 y.o. female with a h/o afib/flutter that had afib/flutter ablation with Dr. Curt Hancock 11/18. She was also started on amiodarone at that time, which she failed and was placed on amiodrone. She continued to have PAF and had second ablation 08/15/16.She called the office this am because she was found to have an elevated heart rate around 100 bpm and BP was elevated. Her BP improved after meds this am but HR stayed elevated. EKG shows possible ectopic atrial tachycardia at 104 bpm.Denies any groin issues or dysphagia.  Continues with blood thinner. The daughter states that she believes pt got anxious because  the daughter was to return to her home in [place] this am.  F/u in [place] clinic 11/29, she has returned to [place]. She took one extra BB dose and returned to rhythm. She is still a little weak from the procedure but no dysphagia or groin issues. She is here with daughter and explains her mother is very anxious especially at night. The pt does live alone and the daughter plans to go back to [place] soon.  F/u in afib clinic 12/22, she is feeling stronger and has not had any afib that she is aware of.   Today, she denies symptoms of palpitations, chest pain, shortness of breath, orthopnea, PND, lower extremity edema, dizziness, presyncope, syncope, or neurologic sequela. The patient is tolerating medications without difficulties and is otherwise without complaint today.   Past Medical History:  Diagnosis Date  . Atrial fibrillation and flutter ([place])    a. s/p PVI ablation in 07/2015 with continue PAF  b. s/p repeat ablation on 08/15/16. continued on Flecainide 100mg  BID  . Carotid bruit    Right  . Chronic back pain   . Depression with anxiety 03/29/2013  . Diverticulosis   . Fibromyalgia   . Foot drop,  right   . GERD (gastroesophageal reflux disease)   . Hemorrhoids   . HTN (hypertension)   . Hyperlipidemia   . IBS (irritable bowel syndrome)   . Memory loss 02/11/2015  . Onychomycosis 07/03/2013  . Osteoporosis   . Sigmoid colon ulcer 03/23/2012   Past Surgical History:  Procedure Laterality Date  . ABDOMINAL HYSTERECTOMY  1981  . BACK SURGERY  1997   reconstructive lower back surgery  . CARDIOVERSION N/A 07/01/2016   Procedure: CARDIOVERSION;  Surgeon: Chelsea Headings, MD;  Location: [facility];  Service: Cardiovascular;  Laterality: N/A;  . CARPAL TUNNEL RELEASE Left 2012   ulnar nerve release at elbow  . CERVICAL FUSION  2003, 2005, 2013  . COLONOSCOPY    . ELECTROPHYSIOLOGIC STUDY N/A 08/10/2015   Procedure: Afib;  Surgeon: Chelsea Meredith Leeds, MD;  Location: [facility];  Service: Cardiovascular;  Laterality: N/A;  . ELECTROPHYSIOLOGIC STUDY N/A 08/15/2016   Procedure: Atrial Fibrillation Ablation;  Surgeon: Chelsea Meredith Leeds, MD;  Location: [facility];  Service: Cardiovascular;  Laterality: N/A;  . EYE SURGERY  2015   b/l cataracts removed  . left elbow surgery  06/03/11   cyst removed  . LUMBAR LAMINECTOMY  1960   x 2  . POSTERIOR CERVICAL FUSION/FORAMINOTOMY  08/03/2012   Procedure: POSTERIOR CERVICAL FUSION/FORAMINOTOMY LEVEL 5;  Surgeon: Chelsea Miss, MD;  Location: [facility];  Service: Neurosurgery;  Laterality: N/A;  Cervical  four to Thoracic two Posterior cervical decompression with Facet and Pedicle screw fixation  . TONSILLECTOMY  1943  . TOTAL HIP ARTHROPLASTY Left 2012  . TOTAL KNEE ARTHROPLASTY Right 11/20/2014   Procedure: RIGHT TOTAL KNEE ARTHROPLASTY;  Surgeon: Chelsea Alf, MD;  [facility]: [facility];  Service: Orthopedics;  Laterality: Right;  . veins stripped  1970  . WRIST SURGERY Right 1990's   cyst removed    Current Outpatient Prescriptions  Medication Sig Dispense Refill  . amoxicillin (AMOXIL) 500 MG capsule     .  atorvastatin (LIPITOR) 10 MG tablet Take 10 mg by mouth daily.    . Biotin 5000 MCG TABS Take 5,000 mcg by mouth daily.    . bisoprolol (ZEBETA) 5 MG tablet Take 1 tablet by mouth in the morning and 1/2 tablet in the evening. 45 tablet 3  . Calcium Carb-Cholecalciferol 600-200 MG-UNIT TABS Take 1 tablet by mouth daily.    Marland Kitchen ELIQUIS 5 MG TABS tablet TAKE 1 TABLET(5 MG) BY MOUTH TWICE DAILY 60 tablet 11  . esomeprazole (NEXIUM) 40 MG capsule Take 1 capsule (40 mg total) by mouth daily at 12 noon. 90 capsule 3  . eszopiclone (LUNESTA) 2 MG TABS tablet Take 1 tablet (2 mg total) by mouth at bedtime as needed for sleep. Take immediately before bedtime 30 tablet 2  . flecainide (TAMBOCOR) 100 MG tablet Take 1 tablet (100 mg total) by mouth 2 (two) times daily. 180 tablet 2  . gabapentin (NEURONTIN) 600 MG tablet TAKE 1 TABLET(600 MG) BY MOUTH TWICE DAILY 180 tablet 0  . MEGARED OMEGA-3 KRILL OIL 500 MG CAPS Take 1 tablet by mouth daily.    . Melatonin 5 MG CAPS Take 5 mg by mouth at bedtime.     . Multiple Vitamins-Minerals (CENTRUM SILVER ULTRA WOMENS) TABS Take 1 tablet by mouth daily.    . nortriptyline (PAMELOR) 50 MG capsule Take 100 mg by mouth at bedtime.    Marland Kitchen ZETIA 10 MG tablet TAKE 1 TABLET BY MOUTH EVERY DAY 30 tablet 6  . Zoledronic Acid (RECLAST IV) Inject into the vein. yearly     No current facility-administered medications for this encounter.     Allergies  Allergen Reactions  . Cefuroxime Axetil Anaphylaxis  . Macrodantin Anaphylaxis  . Alendronate Sodium Other (See Comments)    Severe chest pain similar to heart attack   . Codeine Nausea And Vomiting  . Demerol [Meperidine] Other (See Comments)    hallucinations  . Meperidine Hcl Nausea And Vomiting  . Other Other (See Comments)    Hismanal and Maprobamate  portobello mushrooms - diarrhea  . Sulfonamide Derivatives Hives, Itching and Swelling    Social History   Social History  . Marital status: Divorced    Spouse  name: N/A  . Number of children: N/A  . Years of education: N/A   Occupational History  . Not on file.   Social History Main Topics  . Smoking status: Former Smoker    Packs/day: 0.25    Years: 4.00    Types: Cigarettes  . Smokeless tobacco: Never Used     Comment: quit 20+yrs ago  . Alcohol use 0.0 oz/week     Comment: WINE WITH DINNER. 2 GLASSES RED WINE  . Drug use: No  . Sexual activity: Yes    Partners: Male   Other Topics Concern  . Not on file   Social History Narrative  . No narrative on file    Family History  Problem Relation Age of Onset  . Bipolar disorder Mother   . Stomach cancer Mother   . Mental illness Mother   . Anxiety disorder Mother   . Stroke Father   . Colon cancer Father   . HIV Son     Aids  . Stomach cancer Maternal Grandfather   . Stomach cancer Paternal Grandmother   . Heart disease Maternal Grandmother   . Throat cancer Maternal Uncle     larynx  . Anesthesia problems Neg Hx   . Diabetes Neg Hx     ROS- All systems are reviewed and negative except as per the HPI above  Physical Exam: Vitals:   09/12/16 1147  BP: 110/64  Pulse: (!) 58  Weight: 145 lb (65.8 kg)  Height: 5\' 3"  (1.6 m)    GEN- The patient is well appearing, alert and oriented x 3 today.   Head- normocephalic, atraumatic Eyes-  Sclera clear, conjunctiva pink Ears- hearing intact Oropharynx- clear Neck- supple, no JVP Lymph- no cervical lymphadenopathy Lungs- Clear to ausculation bilaterally, normal work of breathing Heart- Regular rate and rhythm, no murmurs, rubs or gallops, PMI not laterally displaced GI- soft, NT, ND, + BS Extremities- no clubbing, cyanosis, or edema MS- no significant deformity or atrophy Skin- no rash or lesion Psych- euthymic mood, full affect Neuro- strength and sensation are intact  EKG- Sinus brady at 58 bpm, IRBBB, Pr int 222 ms, Qrs int 98 ms, qtc 445 ms Epic records reviewed  Assessment and Plan: 1. afib S/p ablation In  SR  Continue  flecainide at 100 mg bid Can take an extra bisoprolol  1/2 tab  If goes back into irregular rhythm Continue apixaban 5 mg bid  F/u with Dr. Curt Hancock 2/27   Geroge Baseman. Chelsea Hancock, [facility] [address] [place], [place] [address] [phone]

## 2016-09-16 DIAGNOSIS — J069 Acute upper respiratory infection, unspecified: Secondary | ICD-10-CM | POA: Diagnosis not present

## 2016-10-02 IMAGING — DX DG CHEST 2V
2 series · 2 of 2 positions shown · non-contrast
Comparison: 07/07/2015

CLINICAL DATA: Patient complaining of cough for the last several
days.

EXAM:
CHEST  2 VIEW

[chest pa]
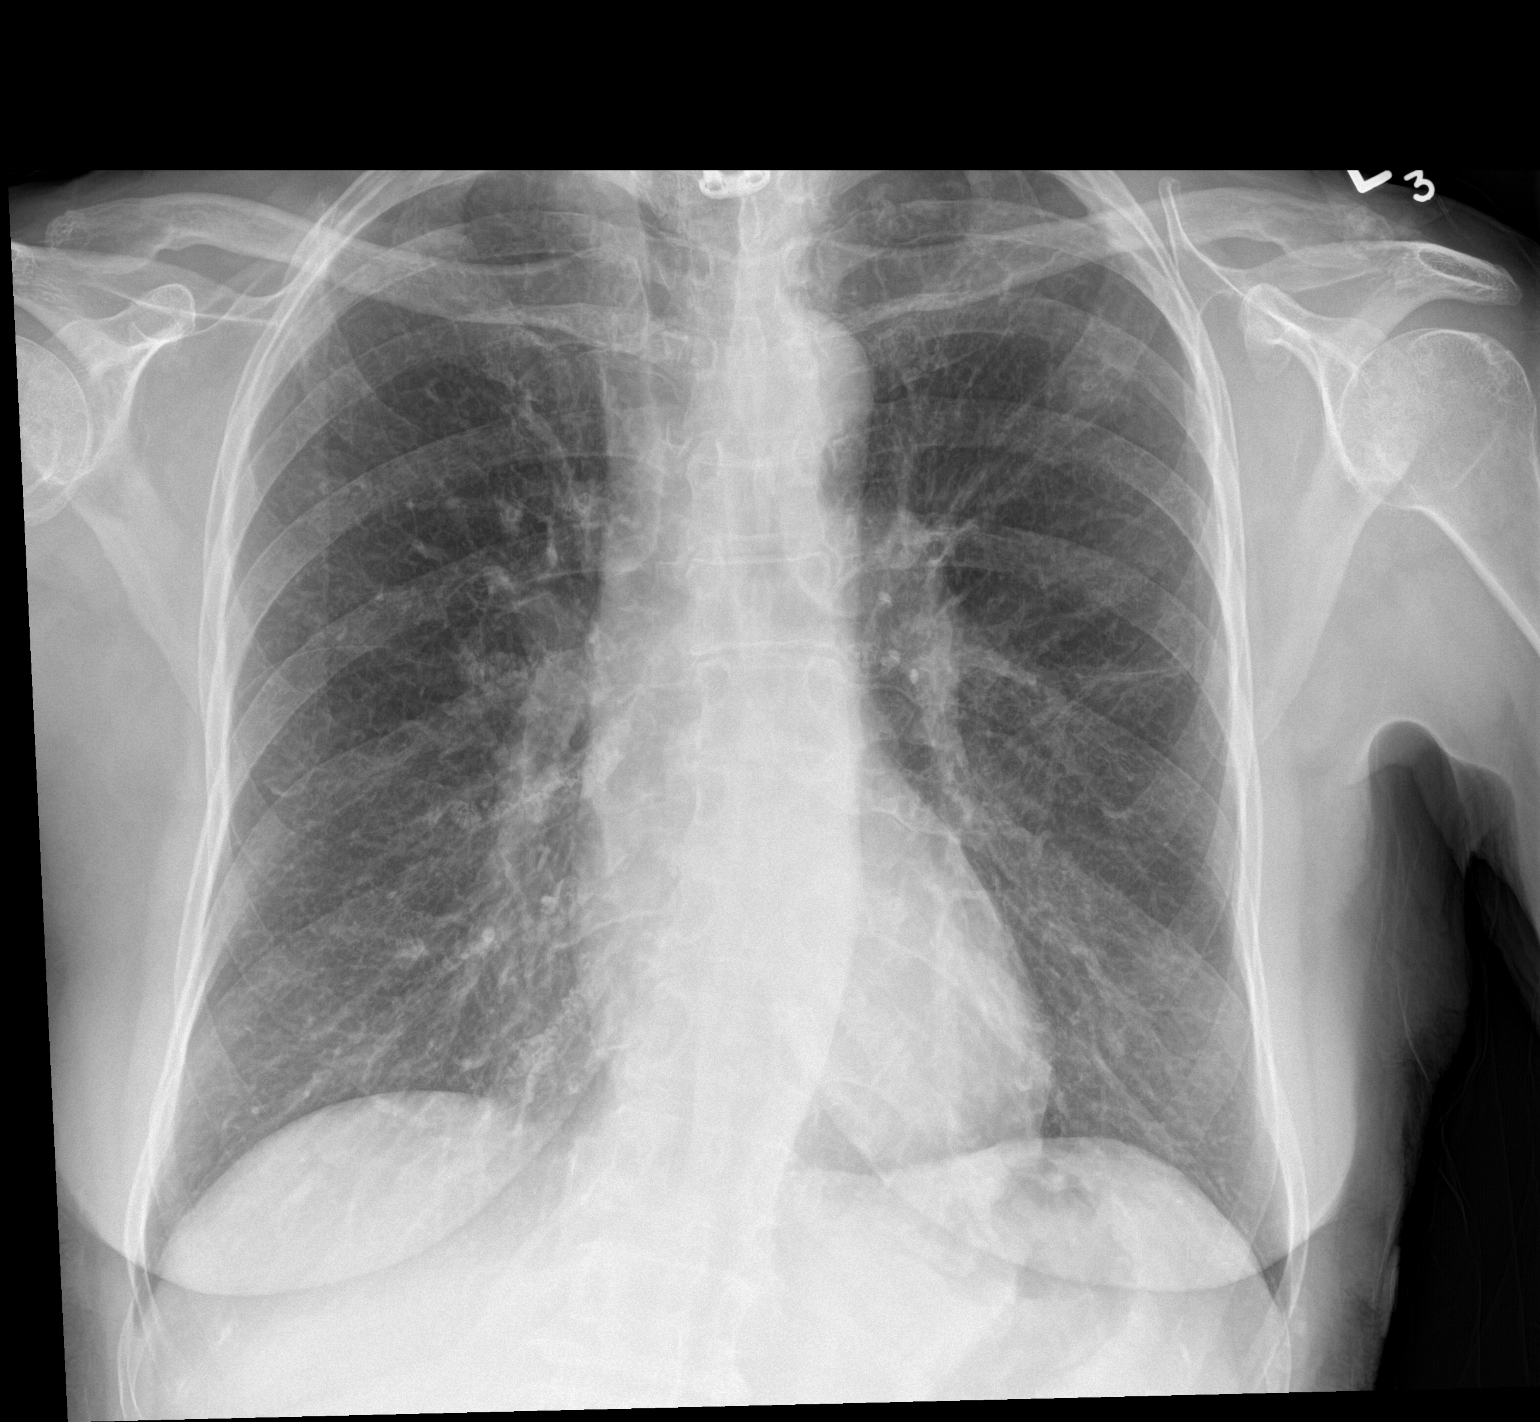

[chest lat]
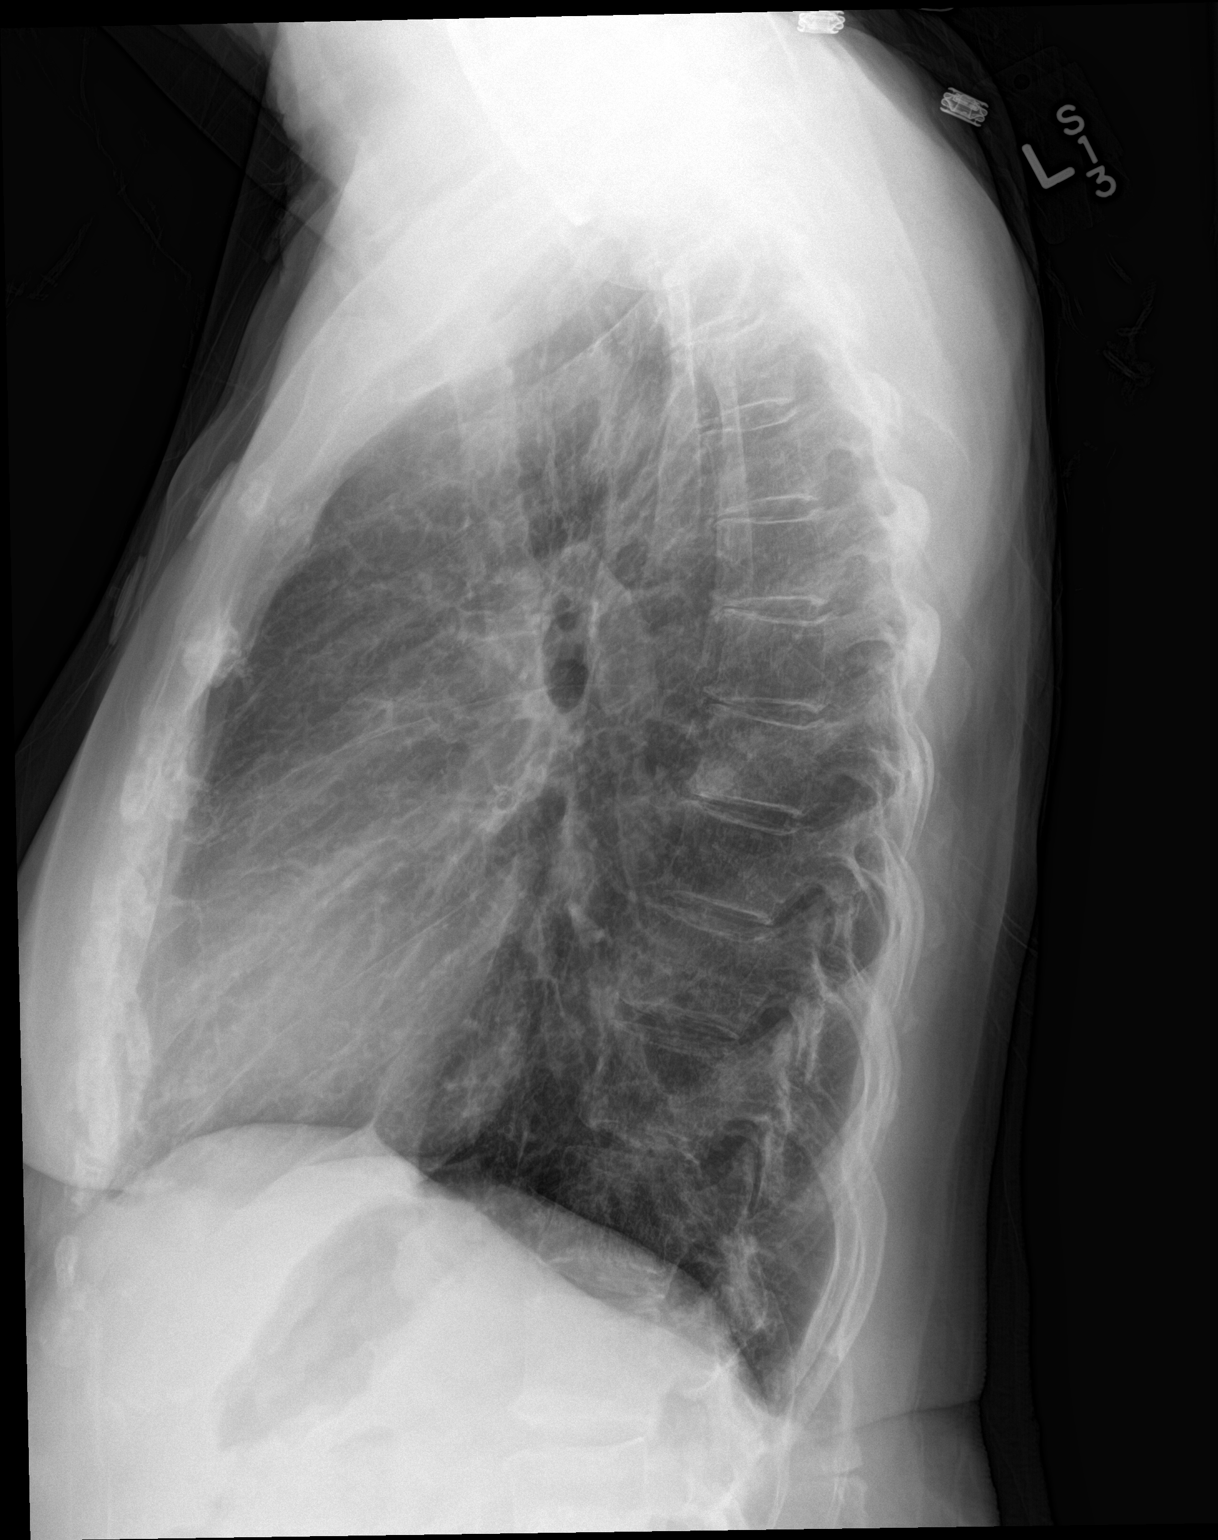

[2 of 2 positions shown; findings below may reference images not displayed]

FINDINGS: Lungs are mildly hyperexpanded. Mild scarring is noted at the apices
and there are prominent bronchovascular markings bilaterally. No
lung consolidation to suggest pneumonia. There is no edema. No
pleural effusion or pneumothorax.

Cardiac silhouette is normal in size and configuration. Normal
mediastinal and hilar contours.

Bony thorax is demineralized but grossly intact.
IMPRESSION: No acute cardiopulmonary disease.

## 2016-10-27 DIAGNOSIS — G3184 Mild cognitive impairment, so stated: Secondary | ICD-10-CM | POA: Diagnosis not present

## 2016-10-27 DIAGNOSIS — I480 Paroxysmal atrial fibrillation: Secondary | ICD-10-CM | POA: Diagnosis not present

## 2016-10-27 DIAGNOSIS — L84 Corns and callosities: Secondary | ICD-10-CM | POA: Diagnosis not present

## 2016-10-27 DIAGNOSIS — R42 Dizziness and giddiness: Secondary | ICD-10-CM | POA: Diagnosis not present

## 2016-11-06 ENCOUNTER — Encounter: Payer: Self-pay | Admitting: Podiatry

## 2016-11-06 ENCOUNTER — Ambulatory Visit (INDEPENDENT_AMBULATORY_CARE_PROVIDER_SITE_OTHER): Payer: Medicare Other | Admitting: Podiatry

## 2016-11-06 VITALS — BP 100/51 | HR 55 | Resp 16 | Ht 63.0 in | Wt 142.0 lb

## 2016-11-06 DIAGNOSIS — M21371 Foot drop, right foot: Secondary | ICD-10-CM

## 2016-11-06 DIAGNOSIS — M779 Enthesopathy, unspecified: Secondary | ICD-10-CM | POA: Diagnosis not present

## 2016-11-06 DIAGNOSIS — L84 Corns and callosities: Secondary | ICD-10-CM | POA: Diagnosis not present

## 2016-11-06 MED ORDER — TRIAMCINOLONE ACETONIDE 10 MG/ML IJ SUSP
10.0000 mg | Freq: Once | INTRAMUSCULAR | Status: AC
  Administered 2016-11-06: 10 mg

## 2016-11-06 NOTE — Progress Notes (Signed)
   Subjective:    Patient ID: Chelsea Hancock, female    DOB: 10/14/1933, 83 y.o.   MRN: [id number]  HPI Chief Complaint  Patient presents with  . Callouses    Right foot; great toe; bottom of toe; pt stated, "Callous hurting toe; toe feels sore";       Review of Systems  Cardiovascular: Positive for palpitations.  Musculoskeletal: Positive for back pain and gait problem.  All other systems reviewed and are negative.      Objective:   Physical Exam        Assessment & Plan:

## 2016-11-06 NOTE — Progress Notes (Signed)
Subjective:     Patient ID: Chelsea Hancock, female   DOB: 08/01/1934, 83 y.o.   MRN: [id number]  HPI patient states that she wears a dropfoot brace and has developed a lot of inflammation and pain of her right big toe and is had it adjusted several times without relief   Review of Systems  All other systems reviewed and are negative.      Objective:   Physical Exam  Constitutional: She is oriented to person, place, and time.  Cardiovascular: Intact distal pulses.   Musculoskeletal: Normal range of motion.  Neurological: She is oriented to person, place, and time.  Skin: Skin is warm.  Nursing note and vitals reviewed.  neurovascular status intact muscle strength adequate range of motion within normal limits with inflammation and pain plantar to the interphalangeal joint right hallux with keratotic lesion formation that's painful and fluid buildup. Patient's found have good digital perfusion well oriented 3     Assessment:     Dropfoot deformity right with nonfunctioning to tibial tendon with inflammatory changes of the interphalangeal joint and proximal of the right plantar hallux along with keratotic lesion formation    Plan:     H&P conditions reviewed and proximal nerve block administered. I then went ahead and did a plantar injection 3 mg [place] some Kenalog and debrided the lesion fully and applied padding and discussed possible new brace if we cannot symptoms under control. Reappoint when symptomatic

## 2016-11-07 ENCOUNTER — Other Ambulatory Visit: Payer: Self-pay | Admitting: Physician Assistant

## 2016-11-18 ENCOUNTER — Encounter: Payer: Self-pay | Admitting: Cardiology

## 2016-11-18 ENCOUNTER — Ambulatory Visit (INDEPENDENT_AMBULATORY_CARE_PROVIDER_SITE_OTHER): Payer: Medicare Other | Admitting: Cardiology

## 2016-11-18 VITALS — BP 144/82 | HR 51 | Ht 63.0 in | Wt 148.0 lb

## 2016-11-18 DIAGNOSIS — I4819 Other persistent atrial fibrillation: Secondary | ICD-10-CM

## 2016-11-18 DIAGNOSIS — N390 Urinary tract infection, site not specified: Secondary | ICD-10-CM | POA: Diagnosis not present

## 2016-11-18 DIAGNOSIS — I481 Persistent atrial fibrillation: Secondary | ICD-10-CM | POA: Diagnosis not present

## 2016-11-18 NOTE — Patient Instructions (Signed)
Medication Instructions:  Your physician recommends that you continue on your current medications as directed. Please refer to the Current Medication list given to you today.  If you need a refill on your cardiac medications before your next appointment, please call your pharmacy.   Labwork: None ordered  Testing/Procedures: None ordered  Follow-Up: Your physician wants you to follow-up in: 6 months with Dr. Camnitz.  You will receive a reminder letter in the mail two months in advance. If you don't receive a letter, please call our office to schedule the follow-up appointment.  Thank you for choosing [facility]!!   Braylee Bosher, RN [phone]         

## 2016-11-18 NOTE — Progress Notes (Signed)
Electrophysiology Office Note   Date:  11/18/2016   ID:  Chelsea Hancock, DOB 09/14/1934, MRN [id number]  PCP:  [name], MD  Cardiologist:  [name] Primary Electrophysiologist:  Tres Grzywacz Meredith Leeds, MD    Chief Complaint  Patient presents with  . Follow-up    Persistent Afib/3 months post ablation     History of Present Illness: Chelsea Hancock is a 83 y.o. female who presents today for electrophysiology evaluation.   She had an ablation for atrial fib/flutter on 07/2015. Repeat ablation scheduled for 08/15/16. Since that time, she is felt well without major complaint. She did yard work last week without any major problems. She has remained in sinus rhythm without evidence of palpitations.  Today, she denies symptoms of chest pain, shortness of breath, orthopnea, PND, lower extremity edema, claudication, dizziness, presyncope, syncope, bleeding, or neurologic sequela. The patient is tolerating medications without difficulties.   Past Medical History:  Diagnosis Date  . Atrial fibrillation and flutter ([place])    a. s/p PVI ablation in 07/2015 with continue PAF  b. s/p repeat ablation on 08/15/16. continued on Flecainide 100mg  BID  . Carotid bruit    Right  . Chronic back pain   . Depression with anxiety 03/29/2013  . Diverticulosis   . Fibromyalgia   . Foot drop, right   . GERD (gastroesophageal reflux disease)   . Hemorrhoids   . HTN (hypertension)   . Hyperlipidemia   . IBS (irritable bowel syndrome)   . Memory loss 02/11/2015  . Onychomycosis 07/03/2013  . Osteoporosis   . Sigmoid colon ulcer 03/23/2012   Past Surgical History:  Procedure Laterality Date  . ABDOMINAL HYSTERECTOMY  1981  . BACK SURGERY  1997   reconstructive lower back surgery  . CARDIOVERSION N/A 07/01/2016   Procedure: CARDIOVERSION;  Surgeon: Thayer Headings, MD;  Location: [facility];  Service: Cardiovascular;  Laterality: N/A;  . CARPAL TUNNEL RELEASE Left 2012   ulnar nerve release at elbow  . CERVICAL FUSION  2003, 2005, 2013  . COLONOSCOPY    . ELECTROPHYSIOLOGIC STUDY N/A 08/10/2015   Procedure: Afib;  Surgeon: Sruti Ayllon Meredith Leeds, MD;  Location: [facility];  Service: Cardiovascular;  Laterality: N/A;  . ELECTROPHYSIOLOGIC STUDY N/A 08/15/2016   Procedure: Atrial Fibrillation Ablation;  Surgeon: Suzana Sohail Meredith Leeds, MD;  Location: [facility];  Service: Cardiovascular;  Laterality: N/A;  . EYE SURGERY  2015   b/l cataracts removed  . left elbow surgery  06/03/11   cyst removed  . LUMBAR LAMINECTOMY  1960   x 2  . POSTERIOR CERVICAL FUSION/FORAMINOTOMY  08/03/2012   Procedure: POSTERIOR CERVICAL FUSION/FORAMINOTOMY LEVEL 5;  Surgeon: [name], MD;  Location: [facility];  Service: Neurosurgery;  Laterality: N/A;  Cervical four to Thoracic two Posterior cervical decompression with Facet and Pedicle screw fixation  . TONSILLECTOMY  1943  . TOTAL HIP ARTHROPLASTY Left 2012  . TOTAL KNEE ARTHROPLASTY Right 11/20/2014   Procedure: RIGHT TOTAL KNEE ARTHROPLASTY;  Surgeon: [name], MD;  Location: [facility];  Service: Orthopedics;  Laterality: Right;  . veins stripped  1970  . WRIST SURGERY Right 1990's   cyst removed     Current Outpatient Prescriptions  Medication Sig Dispense Refill  . atorvastatin (LIPITOR) 10 MG tablet Take 10 mg by mouth daily.    . Biotin 5000 MCG TABS Take 5,000 mcg by mouth daily.    . bisoprolol (ZEBETA) 5 MG tablet Take 1 tablet by  mouth in the morning and 1/2 tablet in the evening. 45 tablet 3  . Calcium Carb-Cholecalciferol 600-200 MG-UNIT TABS Take 1 tablet by mouth daily.    Marland Kitchen ELIQUIS 5 MG TABS tablet TAKE 1 TABLET(5 MG) BY MOUTH TWICE DAILY 60 tablet 11  . esomeprazole (NEXIUM) 40 MG capsule Take 1 capsule (40 mg total) by mouth daily at 12 noon. 90 capsule 3  . eszopiclone (LUNESTA) 2 MG TABS tablet Take 1 tablet (2 mg total) by mouth at bedtime as needed for sleep. Take immediately before  bedtime 30 tablet 2  . flecainide (TAMBOCOR) 100 MG tablet Take 1 tablet (100 mg total) by mouth 2 (two) times daily. 180 tablet 2  . gabapentin (NEURONTIN) 600 MG tablet TAKE 1 TABLET(600 MG) BY MOUTH TWICE DAILY 180 tablet 0  . MEGARED OMEGA-3 KRILL OIL 500 MG CAPS Take 1 tablet by mouth daily.    . Melatonin 5 MG CAPS Take 5 mg by mouth at bedtime.     . Multiple Vitamins-Minerals (CENTRUM SILVER ULTRA WOMENS) TABS Take 1 tablet by mouth daily.    . nortriptyline (PAMELOR) 50 MG capsule Take 100 mg by mouth at bedtime.    Marland Kitchen ZETIA 10 MG tablet TAKE 1 TABLET BY MOUTH EVERY DAY 30 tablet 6  . Zoledronic Acid (RECLAST IV) Inject into the vein. yearly     No current facility-administered medications for this visit.     Allergies:   Cefuroxime axetil; Macrodantin; Alendronate sodium; Codeine; Demerol [meperidine]; Meperidine hcl; Other; and Sulfonamide derivatives   Social History:  The patient  reports that she has quit smoking. Her smoking use included Cigarettes. She has a 1.00 pack-year smoking history. She has never used smokeless tobacco. She reports that she drinks alcohol. She reports that she does not use drugs.   Family History:  The patient's family history includes Anxiety disorder in her mother; Bipolar disorder in her mother; Colon cancer in her father; HIV in her son; Heart disease in her maternal grandmother; Mental illness in her mother; Stomach cancer in her maternal grandfather, mother, and paternal grandmother; Stroke in her father; Throat cancer in her maternal uncle.    ROS:  Please see the history of present illness.   All other systems are reviewed and positive for sweating, walking problems.    PHYSICAL EXAM: VS:  BP (!) 144/82   Pulse (!) 51   Ht 5\' 3"  (1.6 m)   Wt 148 lb (67.1 kg)   BMI 26.22 kg/m  , BMI Body mass index is 26.22 kg/m. GEN: Well nourished, well developed, in no acute distress  HEENT: normal  Neck: no JVD, carotid bruits, or masses Cardiac:  RRR; no murmurs, rubs, or gallops,no edema  Respiratory:  clear to auscultation bilaterally, normal work of breathing GI: soft, nontender, nondistended, + BS MS: no deformity or atrophy  Skin: warm and dry Neuro:  Strength and sensation are intact Psych: euthymic mood, full affect  EKG:  EKG is ordered today. Personal review of the ECG shows sinus rhythm, rate 51, 1 degree AV block  Recent Labs: 12/25/2015: Magnesium 1.9; TSH 6.41 08/08/2016: BUN 24; Creat 0.76; Hemoglobin 13.1; Platelets 215; Potassium 3.9; Sodium 139    Lipid Panel     Component Value Date/Time   CHOL 187 08/03/2014 1053   TRIG 150.0 (H) 08/03/2014 1053   TRIG 133 06/30/2006 1110   HDL 45.80 08/03/2014 1053   CHOLHDL 4 08/03/2014 1053   VLDL 30.0 08/03/2014 1053   LDLCALC 111 (  H) 08/03/2014 1053   LDLDIRECT 93.3 08/04/2008 0915     Wt Readings from Last 3 Encounters:  11/18/16 148 lb (67.1 kg)  11/06/16 142 lb (64.4 kg)  09/12/16 145 lb (65.8 kg)      Other studies Reviewed: Additional studies/ records that were reviewed today include: TTE 2015  Review of the above records today demonstrates: - Left ventricle: The cavity size was normal. There was mild concentric hypertrophy. Systolic function was normal. The estimated ejection fraction was in the range of 55% to 60%. Wall motion was normal; there were no regional wall motion abnormalities. The study is not technically sufficient to allow evaluation of LV diastolic function. - Atrial septum: No defect or patent foramen ovale was identified.   ASSESSMENT AND PLAN:  1.  Atrial fibrillation: S/P ablation and put on amiodarone post ablation. On Eliqius 5 mg BID.  She is tolerating the Eliquis well, she has a CHADS2VASC score of 3.  Repeat AF ablation 08/15/16.  She is doing well without major complaint. Not having any issues with palpitations. We'll continue current medical management.  This patients CHA2DS2-VASc Score and unadjusted Ischemic  Stroke Rate (% per year) is equal to 3.2 % stroke rate/year from a score of 3  Above score calculated as 1 point each if present [CHF, HTN, DM, Vascular=MI/PAD/Aortic Plaque, Age if 65-74, or Female] Above score calculated as 2 points each if present [Age > 75, or Stroke/TIA/TE]  2. Hypertension: Blood pressure is elevated today. She feels like this is due to the fact that she is in the clinic. We'll continue current management.  3. Hyperlipidemia: Into new atorvastatin and zetia  Current medicines are reviewed at length with the patient today.   The patient does not have concerns regarding her medicines.  The following changes were made today:  none  Labs/ tests ordered today include: CBC, BMP No orders of the defined types were placed in this encounter.    Disposition:   FU with Kjersten Ormiston 6  months  Signed, Miaisabella Bacorn Meredith Leeds, MD  11/18/2016 9:09 AM     [facility] [address] [place] [address] [phone] (office) [phone] (fax)

## 2016-12-23 DIAGNOSIS — M1612 Unilateral primary osteoarthritis, left hip: Secondary | ICD-10-CM | POA: Diagnosis not present

## 2017-01-22 DIAGNOSIS — H04123 Dry eye syndrome of bilateral lacrimal glands: Secondary | ICD-10-CM | POA: Diagnosis not present

## 2017-01-22 DIAGNOSIS — H40013 Open angle with borderline findings, low risk, bilateral: Secondary | ICD-10-CM | POA: Diagnosis not present

## 2017-02-12 ENCOUNTER — Other Ambulatory Visit: Payer: Self-pay | Admitting: Surgery

## 2017-02-12 DIAGNOSIS — E042 Nontoxic multinodular goiter: Secondary | ICD-10-CM

## 2017-03-20 ENCOUNTER — Other Ambulatory Visit (HOSPITAL_COMMUNITY): Payer: Self-pay | Admitting: Nurse Practitioner

## 2017-03-23 ENCOUNTER — Other Ambulatory Visit (HOSPITAL_COMMUNITY): Payer: Self-pay | Admitting: Nurse Practitioner

## 2017-03-24 DIAGNOSIS — M810 Age-related osteoporosis without current pathological fracture: Secondary | ICD-10-CM | POA: Diagnosis not present

## 2017-03-24 DIAGNOSIS — M179 Osteoarthritis of knee, unspecified: Secondary | ICD-10-CM | POA: Diagnosis not present

## 2017-03-24 DIAGNOSIS — M797 Fibromyalgia: Secondary | ICD-10-CM | POA: Diagnosis not present

## 2017-03-24 DIAGNOSIS — M159 Polyosteoarthritis, unspecified: Secondary | ICD-10-CM | POA: Diagnosis not present

## 2017-03-24 DIAGNOSIS — E559 Vitamin D deficiency, unspecified: Secondary | ICD-10-CM | POA: Diagnosis not present

## 2017-03-28 ENCOUNTER — Other Ambulatory Visit: Payer: Self-pay | Admitting: Cardiology

## 2017-03-30 NOTE — Telephone Encounter (Signed)
Pt last saw Dr Curt Bears 11/18/16, last labs on 08/08/16 Creat 0.76, age 83, weight 67.1kg, based on specified criteria pt is on appropriate dosage of Eliquis 5mg  BID.  Will refill rx.

## 2017-03-31 DIAGNOSIS — Z1231 Encounter for screening mammogram for malignant neoplasm of breast: Secondary | ICD-10-CM | POA: Diagnosis not present

## 2017-04-02 DIAGNOSIS — S01502A Unspecified open wound of oral cavity, initial encounter: Secondary | ICD-10-CM | POA: Diagnosis not present

## 2017-04-02 DIAGNOSIS — K148 Other diseases of tongue: Secondary | ICD-10-CM | POA: Diagnosis not present

## 2017-04-16 DIAGNOSIS — M810 Age-related osteoporosis without current pathological fracture: Secondary | ICD-10-CM | POA: Diagnosis not present

## 2017-04-17 ENCOUNTER — Other Ambulatory Visit: Payer: Medicare Other

## 2017-04-23 ENCOUNTER — Ambulatory Visit
Admission: RE | Admit: 2017-04-23 | Discharge: 2017-04-23 | Disposition: A | Discharge status: Home / Self Care | Payer: Medicare Other | Source: Ambulatory Visit | Attending: Surgery | Admitting: Surgery

## 2017-04-23 DIAGNOSIS — E042 Nontoxic multinodular goiter: Secondary | ICD-10-CM

## 2017-04-23 DIAGNOSIS — E049 Nontoxic goiter, unspecified: Secondary | ICD-10-CM | POA: Diagnosis not present

## 2017-05-16 ENCOUNTER — Other Ambulatory Visit: Payer: Self-pay | Admitting: Cardiology

## 2017-06-01 DIAGNOSIS — E042 Nontoxic multinodular goiter: Secondary | ICD-10-CM | POA: Diagnosis not present

## 2017-06-01 DIAGNOSIS — E039 Hypothyroidism, unspecified: Secondary | ICD-10-CM | POA: Diagnosis not present

## 2017-07-01 DIAGNOSIS — Z23 Encounter for immunization: Secondary | ICD-10-CM | POA: Diagnosis not present

## 2017-07-06 DIAGNOSIS — Z0000 Encounter for general adult medical examination without abnormal findings: Secondary | ICD-10-CM | POA: Diagnosis not present

## 2017-07-06 DIAGNOSIS — I480 Paroxysmal atrial fibrillation: Secondary | ICD-10-CM | POA: Diagnosis not present

## 2017-07-06 DIAGNOSIS — Z79899 Other long term (current) drug therapy: Secondary | ICD-10-CM | POA: Diagnosis not present

## 2017-07-06 DIAGNOSIS — R29898 Other symptoms and signs involving the musculoskeletal system: Secondary | ICD-10-CM | POA: Diagnosis not present

## 2017-07-06 DIAGNOSIS — Z1389 Encounter for screening for other disorder: Secondary | ICD-10-CM | POA: Diagnosis not present

## 2017-07-06 DIAGNOSIS — E7800 Pure hypercholesterolemia, unspecified: Secondary | ICD-10-CM | POA: Diagnosis not present

## 2017-07-16 ENCOUNTER — Ambulatory Visit: Payer: Medicare Other | Attending: Geriatric Medicine | Admitting: Physical Therapy

## 2017-07-16 DIAGNOSIS — M6281 Muscle weakness (generalized): Secondary | ICD-10-CM

## 2017-07-16 DIAGNOSIS — R2689 Other abnormalities of gait and mobility: Secondary | ICD-10-CM

## 2017-07-16 DIAGNOSIS — R2681 Unsteadiness on feet: Secondary | ICD-10-CM | POA: Diagnosis not present

## 2017-07-17 NOTE — Therapy (Signed)
[facility] [address] [place], [place], [address] Phone: [phone]   Fax:  [phone]  Physical Therapy Evaluation  Patient Details  Name: Chelsea Hancock MRN: [id number] Date of Birth: 11-14-1933 Referring Provider: Dr. Lajean Manes  Encounter Date: 07/16/2017      PT End of Session - 07/17/17 0945    Visit Number 1   Number of Visits 9   Date for PT Re-Evaluation 09/14/17   Authorization Type Medicare primary, [employer] secondary-GCODE every 10th visit   PT Start Time 0933   PT Stop Time 1014   PT Time Calculation (min) 41 min   Activity Tolerance Patient tolerated treatment well   Behavior During Therapy [facility] for tasks assessed/performed      Past Medical History:  Diagnosis Date  . Atrial fibrillation and flutter ([place])    a. s/p PVI ablation in 07/2015 with continue PAF  b. s/p repeat ablation on 08/15/16. continued on Flecainide 100mg  BID  . Carotid bruit    Right  . Chronic back pain   . Depression with anxiety 03/29/2013  . Diverticulosis   . Fibromyalgia   . Foot drop, right   . GERD (gastroesophageal reflux disease)   . Hemorrhoids   . HTN (hypertension)   . Hyperlipidemia   . IBS (irritable bowel syndrome)   . Memory loss 02/11/2015  . Onychomycosis 07/03/2013  . Osteoporosis   . Sigmoid colon ulcer 03/23/2012    Past Surgical History:  Procedure Laterality Date  . ABDOMINAL HYSTERECTOMY  1981  . BACK SURGERY  1997   reconstructive lower back surgery  . CARDIOVERSION N/A 07/01/2016   Procedure: CARDIOVERSION;  Surgeon: Thayer Headings, MD;  Location: [facility];  Service: Cardiovascular;  Laterality: N/A;  . CARPAL TUNNEL RELEASE Left 2012   ulnar nerve release at elbow  . CERVICAL FUSION  2003, 2005, 2013  . COLONOSCOPY    . ELECTROPHYSIOLOGIC STUDY N/A 08/10/2015   Procedure: Afib;  Surgeon: Will Meredith Leeds, MD;  Location: [facility];  Service:  Cardiovascular;  Laterality: N/A;  . ELECTROPHYSIOLOGIC STUDY N/A 08/15/2016   Procedure: Atrial Fibrillation Ablation;  Surgeon: Will Meredith Leeds, MD;  Location: [facility];  Service: Cardiovascular;  Laterality: N/A;  . EYE SURGERY  2015   b/l cataracts removed  . left elbow surgery  06/03/11   cyst removed  . LUMBAR LAMINECTOMY  1960   x 2  . POSTERIOR CERVICAL FUSION/FORAMINOTOMY  08/03/2012   Procedure: POSTERIOR CERVICAL FUSION/FORAMINOTOMY LEVEL 5;  Surgeon: [name], MD;  Location: [facility];  Service: Neurosurgery;  Laterality: N/A;  Cervical four to Thoracic two Posterior cervical decompression with Facet and Pedicle screw fixation  . TONSILLECTOMY  1943  . TOTAL HIP ARTHROPLASTY Left 2012  . TOTAL KNEE ARTHROPLASTY Right 11/20/2014   Procedure: RIGHT TOTAL KNEE ARTHROPLASTY;  Surgeon: [name], MD;  [facility];  Service: Orthopedics;  Laterality: Right;  . veins stripped  1970  . WRIST SURGERY Right 1990's   cyst removed    There were no vitals filed for this visit.       Subjective Assessment - 07/16/17 0937    Subjective Pt describes bilateral leg weakness that has been going on for several years.  Pt has had one fall in the past 6 months.  The fall was due to stepping over dog gate. Able to get up from fall.  Pt describes difficulty with getting up from chairs.  Needs to use cane for longer distance walking.   Pertinent History R TKR, L THR, s/p 7 back surgeries due to ruptured disks, paroxysmal A-fib; R foot drop, GERD, depression   Patient Stated Goals Pt's goals for therapy to strengthen legs to get stronger and to work on balance.   Currently in Pain? Yes   Pain Score 5    Pain Location Knee   Pain Orientation Left   Pain Descriptors / Indicators Aching   Pain Type Chronic pain   Pain Onset More than a month ago   Pain Frequency Constant   Aggravating Factors  weather, getting up from chairs, stepping up or down   Pain Relieving  Factors "getting off of it"   Effect of Pain on Daily Activities PT will monitor pain, but will not address as a goal at this time.            [facility] PT Assessment - 07/16/17 0945      Assessment   Medical Diagnosis bilateral leg weakness   Referring Provider Dr. Lajean Manes     Precautions   Precautions Fall     Balance Screen   Has the patient fallen in the past 6 months Yes   How many times? 1   Has the patient had a decrease in activity level because of a fear of falling?  No   Is the patient reluctant to leave their home because of a fear of falling?  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   Leisure Enjoys Haematologist; used to go to a gym (5-6 years ago)     Associate Professor   Overall Cognitive Status History of cognitive impairments - at baseline  Per MD notes:  memory loss     Observation/Other Assessments   Focus on Therapeutic Outcomes (FOTO)  --     ROM / Strength   AROM / PROM / Strength Strength     Strength   Overall Strength Deficits   Strength Assessment Site Hip;Knee;Ankle   Right/Left Hip Right;Left   Right Hip Flexion 4/5   Right Hip ABduction 3+/5   Left Hip Flexion 4/5   Left Hip ABduction 3+/5   Right/Left Knee Right;Left   Right Knee Flexion 4/5   Right Knee Extension 4/5   Left Knee Flexion 4/5   Left Knee Extension 4/5   Right/Left Ankle Right;Left   Right Ankle Dorsiflexion 1/5   Right Ankle Eversion 1/5   Left Ankle Dorsiflexion  3+/5     Transfers   Transfers Sit to Stand;Stand to Sit   Sit to Stand 6: Modified independent (Device/Increase time);With upper extremity assist;From chair/3-in-1   Stand to Sit 6: Modified independent (Device/Increase time);With upper extremity assist;To chair/3-in-1     Ambulation/Gait   Ambulation/Gait Yes   Ambulation/Gait Assistance 5: Supervision   Ambulation/Gait Assistance Details Occasional R foot inversion/near rolling out, causing patient to have increased lean to R at times.  Ambulation Distance (Feet) 200 Feet   Assistive device None  Pt has R AFO, but not wearing   Gait Pattern Step-through pattern;Decreased step length - right;Decreased step length - left;Decreased dorsiflexion - right;Decreased weight shift to right;Trendelenburg;Narrow base of support;Poor foot clearance - right   Ambulation Surface Level;Indoor   Gait velocity 18.41 sec = 1.78 ft/sec     Standardized Balance Assessment   Standardized Balance Assessment Timed Up and Go Test;Berg Balance Test     Berg Balance Test   Sit to Stand Able to stand  independently using hands   Standing Unsupported Able to stand 2 minutes with supervision   Sitting with Back Unsupported but Feet Supported on Floor or Stool Able to sit safely and securely 2 minutes   Stand to Sit Controls descent by using hands   Transfers Able to transfer safely, definite need of hands   Standing Unsupported with Eyes Closed Able to stand 10 seconds with supervision   Standing Ubsupported with Feet Together Able to place feet together independently and stand for 1 minute with supervision   From Standing, Reach Forward with Outstretched Arm Can reach forward >12 cm safely (5")   From Standing Position, Pick up Object from Floor Able to pick up shoe, needs supervision   From Standing Position, Turn to Look Behind Over each Shoulder Looks behind one side only/other side shows less weight shift   Turn 360 Degrees Able to turn 360 degrees  safely but slowly   Standing Unsupported, Alternately Place Feet on Step/Stool Able to complete >2 steps/needs minimal assist   Standing Unsupported, One Foot in Front Able to take small step independently and hold 30 seconds   Standing on One Leg Unable to try or needs assist to prevent fall   Total Score 36   Berg comment: Scores <45/56 indicate increased fall risk.     Timed Up and Go Test   Normal TUG (seconds) 21.06   TUG Comments Scores >13.5 seconds indicate increased fall risk            Objective measurements completed on examination: See above findings.                       PT Long Term Goals - 07/17/17 0957      PT LONG TERM GOAL #1   Title Pt will be independent with HEP to address balance, strength, gait.  TARGET 08/14/17   Time 4   Period Weeks   Status New   Target Date 08/14/17     PT LONG TERM GOAL #2   Title Pt will improve Berg score to at least 44/56 for decreased fall risk.   Time 4   Period Weeks   Status New   Target Date 08/14/17     PT LONG TERM GOAL #3   Title Pt will improve gait velocity to at least 2 ft/sec for improved gait efficiency and safety.   Time 4   Status New   Target Date 08/14/17     PT LONG TERM GOAL #4   Title Pt will improve TUG score to less than or equal to 16 seconds for decreased fall risk.   Time 4   Period Weeks   Status New   Target Date 08/14/17     PT LONG TERM GOAL #5   Title Pt will verbalize understanding of fall prevention in home environment.   Time 4   Period Weeks   Status New  Target Date 08/14/17                Plan - 07/17/17 0947    Clinical Impression Statement Pt is an 83 year old female who presents to OP PT with bilateral leg weakness with balance, gait deficits.  Pt has history of R TKR, L THR, back surgeries, R foot drop (has AFO, but did not wear into eval).  She has had one fall in the past 6 months.  The patient presents with decreased strength, decreased  balance, decreased independence with gait and transfers.  Patient would benefit from skilled PT to address the above stated deficits to decrease fall risk and improve functional mobility.   History and Personal Factors relevant to plan of care: PMH >3 co-morbidities, 1 recent fall, >3 system involvement   Clinical Presentation Evolving   Clinical Presentation due to: Risk of falls per TUG, Berg, and gait velocity; Hx of R foot drop and not wearing brace   Clinical Decision Making Moderate   Rehab Potential Good   PT Frequency 2x / week   PT Duration 4 weeks  plus eval   PT Next Visit Plan Initiate HEP for balance and strength; pt to bring in or wear AFO-gait training with AFO   Consulted and Agree with Plan of Care Patient      Patient will benefit from skilled therapeutic intervention in order to improve the following deficits and impairments:  Abnormal gait, Decreased balance, Decreased mobility, Decreased strength, Difficulty walking, Decreased safety awareness  Visit Diagnosis: Other abnormalities of gait and mobility  Muscle weakness (generalized)  Unsteadiness on feet      G-Codes - 07/17/17 0959    Functional Assessment Tool Used (Outpatient Only) gt vel 1.78 f/tsec, 36/56 BERG, 21.06 TUG; one fall in past 6 months   Functional Limitation Mobility: Walking and moving around   Mobility: Walking and Moving Around Current Status [phone]) At least 40 percent but less than 60 percent impaired, limited or restricted   Mobility: Walking and Moving Around Goal Status [phone]) At least 20 percent but less than 40 percent impaired, limited or restricted       Problem List Patient Active Problem List   Diagnosis Date Noted  . AF (atrial fibrillation) ([place]) 08/16/2016  . Atrial fibrillation and flutter ([place])   . HTN (hypertension)   . Chronic anticoagulation - Eliquis, CHADS2VASC=2 08/11/2015  . Mild cognitive impairment with memory loss 03/21/2015  . Insomnia 02/11/2015  .  Protein deficiency ([place]) 02/11/2015  . Memory loss 02/11/2015  . Hypotension 01/21/2015  . OA (osteoarthritis) of knee 11/20/2014  . Medicare annual wellness visit, subsequent 08/13/2014  . Dyslipidemia 05/07/2014  . Incontinence of feces with fecal urgency 03/31/2014  . Hot flashes 01/24/2014  . Onychomycosis 07/03/2013  . Personal history of colonic polyps 06/28/2013  . Hypocalcemia 03/29/2013  . Depression with anxiety 03/29/2013  . Migraine 04/27/2012  . Hyperglycemia 04/27/2012  . Thrush 03/23/2012  . Sigmoid colon ulcer 03/23/2012  . Multinodular thyroid 09/02/2011  . CAROTID BRUIT 11/08/2010  . DEGENERATIVE DISC DISEASE, LUMBOSACRAL SPINE - followed by Dr. Ellene Route, [facility]  02/19/2010  . VERTEBRAL FRACTURE 02/19/2010  . LUMBOSACRAL STRAIN, ACUTE 02/19/2010  . BURSITIS, ACROMIOCLAVICULAR, LEFT 09/20/2009  . Irritable bowel syndrome 03/06/2009  . CERVICAL RADICULOPATHY 03/06/2009  . DIARRHEA, RECURRENT 03/06/2009  . GERD 08/04/2008  . Fibromyalgia 08/04/2008  . OSTEOPENIA 08/04/2008    Deloma Spindle W. 07/17/2017, 10:00 AM  Frazier Butt., PT   [facility] [address] [place], [place], [address] Phone: [phone]   Fax:  [phone]  Name: Chelsea Hancock MRN: [id number] Date of Birth: [birth year]

## 2017-07-22 DIAGNOSIS — C44519 Basal cell carcinoma of skin of other part of trunk: Secondary | ICD-10-CM | POA: Diagnosis not present

## 2017-07-22 DIAGNOSIS — D235 Other benign neoplasm of skin of trunk: Secondary | ICD-10-CM | POA: Diagnosis not present

## 2017-07-22 DIAGNOSIS — D1801 Hemangioma of skin and subcutaneous tissue: Secondary | ICD-10-CM | POA: Diagnosis not present

## 2017-07-22 DIAGNOSIS — D485 Neoplasm of uncertain behavior of skin: Secondary | ICD-10-CM | POA: Diagnosis not present

## 2017-07-22 DIAGNOSIS — L821 Other seborrheic keratosis: Secondary | ICD-10-CM | POA: Diagnosis not present

## 2017-07-27 ENCOUNTER — Ambulatory Visit: Payer: Medicare Other | Admitting: Physical Therapy

## 2017-07-28 ENCOUNTER — Encounter: Payer: Self-pay | Admitting: Physical Therapy

## 2017-07-28 ENCOUNTER — Ambulatory Visit: Payer: Medicare Other | Attending: Geriatric Medicine | Admitting: Physical Therapy

## 2017-07-28 DIAGNOSIS — R2689 Other abnormalities of gait and mobility: Secondary | ICD-10-CM | POA: Diagnosis not present

## 2017-07-28 DIAGNOSIS — M6281 Muscle weakness (generalized): Secondary | ICD-10-CM

## 2017-07-28 DIAGNOSIS — R2681 Unsteadiness on feet: Secondary | ICD-10-CM | POA: Insufficient documentation

## 2017-07-28 NOTE — Therapy (Signed)
[facility] [address] [place], [place], [address] Phone: [phone]   Fax:  [phone]  Physical Therapy Treatment  Patient Details  Name: Chelsea Hancock MRN: [id number] Date of Birth: 1934-02-06 Referring Provider: Dr. Lajean Manes   Encounter Date: 07/28/2017  PT End of Session - 07/28/17 2250    Visit Number  2    Number of Visits  9    Date for PT Re-Evaluation  09/14/17    Authorization Type  Medicare primary, [employer] secondary-GCODE every 10th visit    PT Start Time  1018    PT Stop Time  1057    PT Time Calculation (min)  39 min    Activity Tolerance  Patient tolerated treatment well    Behavior During Therapy  [facility] for tasks assessed/performed       Past Medical History:  Diagnosis Date  . Atrial fibrillation and flutter ([place])    a. s/p PVI ablation in 07/2015 with continue PAF  b. s/p repeat ablation on 08/15/16. continued on Flecainide 100mg  BID  . Carotid bruit    Right  . Chronic back pain   . Depression with anxiety 03/29/2013  . Diverticulosis   . Fibromyalgia   . Foot drop, right   . GERD (gastroesophageal reflux disease)   . Hemorrhoids   . HTN (hypertension)   . Hyperlipidemia   . IBS (irritable bowel syndrome)   . Memory loss 02/11/2015  . Onychomycosis 07/03/2013  . Osteoporosis   . Sigmoid colon ulcer 03/23/2012    Past Surgical History:  Procedure Laterality Date  . ABDOMINAL HYSTERECTOMY  1981  . BACK SURGERY  1997   reconstructive lower back surgery  . CARPAL TUNNEL RELEASE Left 2012   ulnar nerve release at elbow  . CERVICAL FUSION  2003, 2005, 2013  . COLONOSCOPY    . EYE SURGERY  2015   b/l cataracts removed  . left elbow surgery  06/03/11   cyst removed  . LUMBAR LAMINECTOMY  1960   x 2  . TONSILLECTOMY  1943  . TOTAL HIP ARTHROPLASTY Left 2012  . veins stripped  1970  . WRIST SURGERY Right 1990's   cyst removed    There were no vitals filed for  this visit.  Subjective Assessment - 07/28/17 1021    Subjective  Wore two separate shoes today, so that I could wear the shoe and brace on my R foot.  I have sharp pain today in my L hip down my leg.      Pertinent History  R TKR, L THR, s/p 7 back surgeries due to ruptured disks, paroxysmal A-fib; R foot drop, GERD, depression    Patient Stated Goals  Pt's goals for therapy to strengthen legs to get stronger and to work on balance.    Currently in Pain?  Yes    Pain Score  8     Pain Location  Hip lateral L leg   lateral L leg   Pain Orientation  Left    Pain Descriptors / Indicators  Sharp pinching   pinching   Pain Type  Chronic pain    Pain Onset  More than a month ago    Pain Frequency  Intermittent    Aggravating Factors   bearing weight on leg (at times), not all the time    Pain Relieving Factors  sitting down and getting weight off of it  [facility] Adult PT Treatment/Exercise - 07/28/17 1025      Ambulation/Gait   Ambulation/Gait  Yes    Ambulation/Gait Assistance  5: Supervision    Ambulation/Gait Assistance Details  Pt wearing R AFO (in slighter higher tennis shoe than L shoe-lower profile shoe)-noted to have more pronounced Trendelenburg pattern today with gait    Ambulation Distance (Feet)  120 Feet 30 ft x 2      Assistive device  Straight cane    Gait Pattern  Step-through pattern;Decreased step length - right;Decreased step length - left;Decreased dorsiflexion - right;Decreased weight shift to right;Trendelenburg;Narrow base of support;Poor foot clearance - right;Antalgic    Ambulation Surface  Level    Gait velocity  19.90 sec = 1.65 ft/sec Pt wearing 2 different shoes to accomodate AFO on RLE   Pt wearing 2 different shoes to accomodate AFO on RLE   Pre-Gait Activities  Recommnended patient use R AFO with gait    Gait Comments  Gait x 40 ft x 2 reps with cane and HHA for furniture       Self-Care   Self-Care  Other Self-Care  Comments    Other Self-Care Comments   Assessed for L ilitiotibial band tightness with palpation along L IT band; pt noted to have tenderness to palpation along IT band, with significant tightness/trigger points noted.  Discussed benefits of using RAFO consistently as well as wearing appropriate shoewear (the same type shoe on R and L when wearing AFO, to lessen off-balance with gait)      Exercises   Exercises  Other Exercises    Other Exercises   Attempted supine L IT band stretch with RLE to pull LLE inward-pt describes pain in L knee.  Attempted massage to L IT band, x 3 minutes, using heel of hand and using roller along L IT band (pt grimaces wiht pain throughout) in sidelying position.  Pt attempted use of roller and heel of hand for massage along L IT band.  Attempted standing counter exercises-lateral weightshfiting x 10 reps, standing feet apart and together with head turns, head nods with UE support at counter; UE alternating reaching to cabinets x 10 reps.  Pt requests not to perform SLS activities due to pain in L lateral thigh area.             PT Education - 07/28/17 2248    Education provided  Yes    Education Details  Wear AFO and appropriate shoe wear; hip weakness, Trendelenburg pattern/IT band tightness contributing to LLE pain    Person(s) Educated  Patient    Methods  Explanation;Demonstration    Comprehension  Verbalized understanding          PT Long Term Goals - 07/17/17 0957      PT LONG TERM GOAL #1   Title  Pt will be independent with HEP to address balance, strength, gait.  TARGET 08/14/17    Time  4    Period  Weeks    Status  New    Target Date  08/14/17      PT LONG TERM GOAL #2   Title  Pt will improve Berg score to at least 44/56 for decreased fall risk.    Time  4    Period  Weeks    Status  New    Target Date  08/14/17      PT LONG TERM GOAL #3   Title  Pt will improve gait velocity to at least 2 ft/sec for  improved gait efficiency and  safety.    Time  4    Status  New    Target Date  08/14/17      PT LONG TERM GOAL #4   Title  Pt will improve TUG score to less than or equal to 16 seconds for decreased fall risk.    Time  4    Period  Weeks    Status  New    Target Date  08/14/17      PT LONG TERM GOAL #5   Title  Pt will verbalize understanding of fall prevention in home environment.    Time  4    Period  Weeks    Status  New    Target Date  08/14/17            Plan - 07/28/17 2250    Clinical Impression Statement  Skilled PT session with gait training using R AFO, with PT recommendation to continue to wear AFO. Improved foot clearance noted with AFO; gait velocity is slower today, but pt notes slowed gait due to increased LLE with weightbearing.  Pt feels this is longstandin.  PT feels pain may be due to tight IT band (with palpation-noted to have significant tenderness and multiple trigger points), but after palpation and attempts at stretching, pt's pain has increased and she does not want to work any further on SLS or standing activities.  PT will continue to monitor pain and work to address strength, balance and gait for improved functional mobility.    Rehab Potential  Good    PT Frequency  2x / week    PT Duration  4 weeks plus eval   plus eval   PT Treatment/Interventions  ADLs/Self Care Home Management;Gait training;Stair training;Functional mobility training;Therapeutic activities;Therapeutic exercise;Balance training;Orthotic Fit/Training;Patient/family education;Neuromuscular re-education;Manual techniques    PT Next Visit Plan  Work on initiating HEP for balance and strength (hip strengthening); gait training with AFO    Consulted and Agree with Plan of Care  Patient       Patient will benefit from skilled therapeutic intervention in order to improve the following deficits and impairments:  Abnormal gait, Decreased balance, Decreased mobility, Decreased strength, Difficulty walking, Decreased  safety awareness  Visit Diagnosis: Other abnormalities of gait and mobility  Muscle weakness (generalized)  Unsteadiness on feet     Problem List Patient Active Problem List   Diagnosis Date Noted  . AF (atrial fibrillation) ([place]) 08/16/2016  . Atrial fibrillation and flutter ([place])   . HTN (hypertension)   . Chronic anticoagulation - Eliquis, CHADS2VASC=2 08/11/2015  . Mild cognitive impairment with memory loss 03/21/2015  . Insomnia 02/11/2015  . 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 Memory loss 02/11/2015  . Hypotension 01/21/2015  . OA (osteoarthritis) of knee 11/20/2014  . Medicare annual wellness visit, subsequent 08/13/2014  . Dyslipidemia 05/07/2014  . Incontinence of feces with fecal urgency 03/31/2014  . Hot flashes 01/24/2014  . Onychomycosis 07/03/2013  . Personal history of colonic polyps 06/28/2013  . Hypocalcemia 03/29/2013  . Depression with anxiety 03/29/2013  . Migraine 04/27/2012  . Hyperglycemia 04/27/2012  . Thrush 03/23/2012  . Sigmoid colon ulcer 03/23/2012  . Multinodular thyroid 09/02/2011  . CAROTID BRUIT 11/08/2010  . DEGENERATIVE DISC DISEASE, LUMBOSACRAL SPINE - followed by Dr. Ellene Route, [facility]  02/19/2010  . VERTEBRAL FRACTURE 02/19/2010  . LUMBOSACRAL STRAIN, ACUTE 02/19/2010  . BURSITIS, ACROMIOCLAVICULAR, LEFT 09/20/2009  . Irritable bowel syndrome 03/06/2009  . CERVICAL RADICULOPATHY 03/06/2009  .  DIARRHEA, RECURRENT 03/06/2009  . GERD 08/04/2008  . Fibromyalgia 08/04/2008  . OSTEOPENIA 08/04/2008    Garald Rhew W. 07/28/2017, 10:58 PM  Frazier Butt., PT   [facility] [address] [place], [place], [address] Phone: [phone]   Fax:  [phone]  Name: Chelsea Hancock MRN: [id number] Date of Birth: 06-15-1934

## 2017-07-30 ENCOUNTER — Encounter: Payer: Self-pay | Admitting: Physical Therapy

## 2017-07-30 ENCOUNTER — Ambulatory Visit: Payer: Medicare Other | Admitting: Physical Therapy

## 2017-07-30 DIAGNOSIS — R2681 Unsteadiness on feet: Secondary | ICD-10-CM

## 2017-07-30 DIAGNOSIS — M6281 Muscle weakness (generalized): Secondary | ICD-10-CM | POA: Diagnosis not present

## 2017-07-30 DIAGNOSIS — R2689 Other abnormalities of gait and mobility: Secondary | ICD-10-CM | POA: Diagnosis not present

## 2017-07-30 NOTE — Therapy (Signed)
[facility] [address] [place], [place], [address] Phone: [phone]   Fax:  [phone]  Physical Therapy Treatment  Patient Details  Name: Chelsea Hancock MRN: [id number] Date of Birth: 03-25-1934 Referring Provider: Dr. Lajean Manes   Encounter Date: 07/30/2017  PT End of Session - 07/30/17 2137    Visit Number  3    Number of Visits  9    Date for PT Re-Evaluation  09/14/17    Authorization Type  Medicare primary, [employer] secondary-GCODE every 10th visit    PT Start Time  0935    PT Stop Time  1014    PT Time Calculation (min)  39 min    Activity Tolerance  Patient tolerated treatment well    Behavior During Therapy  [facility] for tasks assessed/performed       Past Medical History:  Diagnosis Date  . Atrial fibrillation and flutter ([place])    a. s/p PVI ablation in 07/2015 with continue PAF  b. s/p repeat ablation on 08/15/16. continued on Flecainide 100mg  BID  . Carotid bruit    Right  . Chronic back pain   . Depression with anxiety 03/29/2013  . Diverticulosis   . Fibromyalgia   . Foot drop, right   . GERD (gastroesophageal reflux disease)   . Hemorrhoids   . HTN (hypertension)   . Hyperlipidemia   . IBS (irritable bowel syndrome)   . Memory loss 02/11/2015  . Onychomycosis 07/03/2013  . Osteoporosis   . Sigmoid colon ulcer 03/23/2012    Past Surgical History:  Procedure Laterality Date  . ABDOMINAL HYSTERECTOMY  1981  . BACK SURGERY  1997   reconstructive lower back surgery  . CARPAL TUNNEL RELEASE Left 2012   ulnar nerve release at elbow  . CERVICAL FUSION  2003, 2005, 2013  . COLONOSCOPY    . EYE SURGERY  2015   b/l cataracts removed  . left elbow surgery  06/03/11   cyst removed  . LUMBAR LAMINECTOMY  1960   x 2  . TONSILLECTOMY  1943  . TOTAL HIP ARTHROPLASTY Left 2012  . veins stripped  1970  . WRIST SURGERY Right 1990's   cyst removed    There were no vitals filed for  this visit.  Subjective Assessment - 07/30/17 0937    Subjective  Not really having any pain today.  Tried the rolling pin along the left thigh at home and I think it helps.    Pertinent History  R TKR, L THR, s/p 7 back surgeries due to ruptured disks, paroxysmal A-fib; R foot drop, GERD, depression    Patient Stated Goals  Pt's goals for therapy to strengthen legs to get stronger and to work on balance.    Currently in Pain?  Yes    Pain Score  7     Pain Location  Hip    Pain Orientation  Left    Pain Descriptors / Indicators  Sharp    Pain Type  Chronic pain    Pain Onset  More than a month ago    Pain Frequency  Intermittent    Aggravating Factors   bearing weight on leg at times, getting up after sitting too long    Pain Relieving Factors  resting, sitting                      [facility] Adult PT Treatment/Exercise - 07/30/17 0001      Exercises  Exercises  Knee/Hip      Knee/Hip Exercises: Aerobic   Nustep  NuStep, Level 2, 4 extremities x 8 minutes, for lower extremity strengthening.          Balance Exercises - 07/30/17 0955      Balance Exercises: Standing   Standing Eyes Opened  Wide (BOA);Narrow base of support (BOS);Head turns head nods x 10 reps    Standing Eyes Closed  Wide (BOA);Narrow base of support (BOS);Solid surface;1 rep;10 secs    SLS  Eyes open;Solid surface;Upper extremity support 2;1 rep;10 secs    Partial Tandem Stance  Eyes open;Upper extremity support 1;2 reps;10 secs    Retro Gait  Upper extremity support;3 reps Forward/back along counter    Sidestepping  Upper extremity support;3 reps Along counter     Marching Limitations  x 10 reps each leg    Other Standing Exercises  Step taps side, 10 reps each side, back 10 reps each leg; forward step taps x 10 reps each leg. Forward step taps to 6" step, alternating legs x 10 reps        PT Education - 07/30/17 2137    Education provided  Yes    Education Details  HEP for balance-see  instructions    Person(s) Educated  Patient    Methods  Demonstration;Explanation;Handout    Comprehension  Verbalized understanding;Returned demonstration          PT Long Term Goals - 07/17/17 0957      PT LONG TERM GOAL #1   Title  Pt will be independent with HEP to address balance, strength, gait.  TARGET 08/14/17    Time  4    Period  Weeks    Status  New    Target Date  08/14/17      PT LONG TERM GOAL #2   Title  Pt will improve Berg score to at least 44/56 for decreased fall risk.    Time  4    Period  Weeks    Status  New    Target Date  08/14/17      PT LONG TERM GOAL #3   Title  Pt will improve gait velocity to at least 2 ft/sec for improved gait efficiency and safety.    Time  4    Status  New    Target Date  08/14/17      PT LONG TERM GOAL #4   Title  Pt will improve TUG score to less than or equal to 16 seconds for decreased fall risk.    Time  4    Period  Weeks    Status  New    Target Date  08/14/17      PT LONG TERM GOAL #5   Title  Pt will verbalize understanding of fall prevention in home environment.    Time  4    Period  Weeks    Status  New    Target Date  08/14/17            Plan - 07/30/17 2138    Clinical Impression Statement  Pt overall with less c/o pain today and no increase in pain with balance and strengthening exercises.  HEP initiated to address balance.  Pt will continue to benefit from further skilled PT to address balance and gait.    Rehab Potential  Good    PT Frequency  2x / week    PT Duration  4 weeks plus eval    PT  Treatment/Interventions  ADLs/Self Care Home Management;Gait training;Stair training;Functional mobility training;Therapeutic activities;Therapeutic exercise;Balance training;Orthotic Fit/Training;Patient/family education;Neuromuscular re-education;Manual techniques    PT Next Visit Plan  Review HEP for balance and strength (hip strengthening); gait training with AFO; complaint surface and dynamic gait     Consulted and Agree with Plan of Care  Patient       Patient will benefit from skilled therapeutic intervention in order to improve the following deficits and impairments:  Abnormal gait, Decreased balance, Decreased mobility, Decreased strength, Difficulty walking, Decreased safety awareness  Visit Diagnosis: Unsteadiness on feet  Other abnormalities of gait and mobility  Muscle weakness (generalized)     Problem List Patient Active Problem List   Diagnosis Date Noted  . AF (atrial fibrillation) ([place]) 08/16/2016  . Atrial fibrillation and flutter ([place])   . HTN (hypertension)   . Chronic anticoagulation - Eliquis, CHADS2VASC=2 08/11/2015  . Mild cognitive impairment with memory loss 03/21/2015  . Insomnia 02/11/2015  . 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 Memory loss 02/11/2015  . Hypotension 01/21/2015  . OA (osteoarthritis) of knee 11/20/2014  . Medicare annual wellness visit, subsequent 08/13/2014  . Dyslipidemia 05/07/2014  . Incontinence of feces with fecal urgency 03/31/2014  . Hot flashes 01/24/2014  . Onychomycosis 07/03/2013  . Personal history of colonic polyps 06/28/2013  . Hypocalcemia 03/29/2013  . Depression with anxiety 03/29/2013  . Migraine 04/27/2012  . Hyperglycemia 04/27/2012  . Thrush 03/23/2012  . Sigmoid colon ulcer 03/23/2012  . Multinodular thyroid 09/02/2011  . CAROTID BRUIT 11/08/2010  . DEGENERATIVE DISC DISEASE, LUMBOSACRAL SPINE - followed by Dr. Ellene Route, [facility]  02/19/2010  . VERTEBRAL FRACTURE 02/19/2010  . LUMBOSACRAL STRAIN, ACUTE 02/19/2010  . BURSITIS, ACROMIOCLAVICULAR, LEFT 09/20/2009  . Irritable bowel syndrome 03/06/2009  . CERVICAL RADICULOPATHY 03/06/2009  . DIARRHEA, RECURRENT 03/06/2009  . GERD 08/04/2008  . Fibromyalgia 08/04/2008  . OSTEOPENIA 08/04/2008    Rolfe Hartsell W. 07/30/2017, 9:44 PM  Frazier Butt., PT   [facility] [place] [address]  [place], [place], [address] Phone: [phone]   Fax:  [phone]  Name: Chelsea Hancock MRN: [id number] Date of Birth: 1934/07/24

## 2017-07-30 NOTE — Patient Instructions (Addendum)
Side-Stepping    Along the counter, walk to left side with eyes open. Take even steps, leading with same foot. Make sure each foot lifts off the floor. Repeat in opposite direction. Repeat for 3-5 times per session.   Copyright  VHI. All rights reserved.  Backward Walking    At the counter, hold on as needed.  Walk backward, toes of each foot coming down first. Take long, even strides. Make sure you have a clear pathway with no obstructions when you do this.  Then walk forward along the counter.  Repeat this sequence 3-5 times.   Copyright  VHI. All rights reserved.   STAND IN CORNER WITH CHAIR IN FRONT:  Feet Apart, Head Motion - Eyes Open    With eyes open, feet apart, move head slowly: up and down 10 times, then head side to side 10 times. Do __1-2__ sessions per day.  Copyright  VHI. All rights reserved.  Feet Apart, Varied Arm Positions - Eyes Closed    Stand with feet shoulder width apart and arms by your sides. Close eyes and visualize upright position. Hold __10__ seconds.  Do _1-2___ sessions per day.  Copyright  VHI. All rights reserved.  Feet Together, Head Motion - Eyes Open    With eyes open, feet together, move head slowly: up and down 10 times, then move head side to side 10 times. Do _1-2___ sessions per day.  Copyright  VHI. All rights reserved.  Feet Together, Varied Arm Positions - Eyes Closed    Stand with feet together and arms by your side. Close eyes and visualize upright position. Hold __10__ seconds. Do __1-2_ sessions per day.  Copyright  VHI. All rights reserved.

## 2017-08-03 ENCOUNTER — Ambulatory Visit: Payer: Medicare Other | Admitting: Physical Therapy

## 2017-08-03 ENCOUNTER — Encounter: Payer: Self-pay | Admitting: Physical Therapy

## 2017-08-03 DIAGNOSIS — R2689 Other abnormalities of gait and mobility: Secondary | ICD-10-CM

## 2017-08-03 DIAGNOSIS — R2681 Unsteadiness on feet: Secondary | ICD-10-CM

## 2017-08-03 DIAGNOSIS — M6281 Muscle weakness (generalized): Secondary | ICD-10-CM | POA: Diagnosis not present

## 2017-08-03 NOTE — Therapy (Signed)
[facility] [address] [place], [place], [address] Phone: [phone]   Fax:  [phone]  Physical Therapy Treatment  Patient Details  Name: Chelsea Hancock MRN: [id number] Date of Birth: 09-Aug-1934 Referring Provider: Dr. Lajean Manes   Encounter Date: 08/03/2017  PT End of Session - 08/03/17 1348    Visit Number  4    Number of Visits  9    Date for PT Re-Evaluation  09/14/17    Authorization Type  Medicare primary, [employer] secondary-GCODE every 10th visit    PT Start Time  [phone]    PT Stop Time  0925    PT Time Calculation (min)  42 min    Activity Tolerance  Patient tolerated treatment well    Behavior During Therapy  [facility] for tasks assessed/performed       Past Medical History:  Diagnosis Date  . Atrial fibrillation and flutter ([place])    a. s/p PVI ablation in 07/2015 with continue PAF  b. s/p repeat ablation on 08/15/16. continued on Flecainide 100mg  BID  . Carotid bruit    Right  . Chronic back pain   . Depression with anxiety 03/29/2013  . Diverticulosis   . Fibromyalgia   . Foot drop, right   . GERD (gastroesophageal reflux disease)   . Hemorrhoids   . HTN (hypertension)   . Hyperlipidemia   . IBS (irritable bowel syndrome)   . Memory loss 02/11/2015  . Onychomycosis 07/03/2013  . Osteoporosis   . Sigmoid colon ulcer 03/23/2012    Past Surgical History:  Procedure Laterality Date  . ABDOMINAL HYSTERECTOMY  1981  . BACK SURGERY  1997   reconstructive lower back surgery  . CARPAL TUNNEL RELEASE Left 2012   ulnar nerve release at elbow  . CERVICAL FUSION  2003, 2005, 2013  . COLONOSCOPY    . EYE SURGERY  2015   b/l cataracts removed  . left elbow surgery  06/03/11   cyst removed  . LUMBAR LAMINECTOMY  1960   x 2  . TONSILLECTOMY  1943  . TOTAL HIP ARTHROPLASTY Left 2012  . veins stripped  1970  . WRIST SURGERY Right 1990's   cyst removed    There were no vitals filed  for this visit.  Subjective Assessment - 08/03/17 0845    Subjective  Did the exercises without difficulty over the weekend.  Feel my left hip and knee don't have as much pain.  (Pt wearing two different shoes today)-pt's response is that she puts on the left shoe to match her outfit.    Pertinent History  R TKR, L THR, s/p 7 back surgeries due to ruptured disks, paroxysmal A-fib; R foot drop, GERD, depression    Patient Stated Goals  Pt's goals for therapy to strengthen legs to get stronger and to work on balance.    Currently in Pain?  Yes    Pain Score  3  4/10pain at end of session    Pain Location  Knee    Pain Orientation  Left    Pain Descriptors / Indicators  Sharp    Pain Type  Chronic pain    Pain Onset  More than a month ago    Aggravating Factors   walking too much    Pain Relieving Factors  resting and being still                      [facility] Adult PT Treatment/Exercise -  08/03/17 0001      Ambulation/Gait   Ambulation/Gait  Yes    Ambulation/Gait Assistance  5: Supervision;4: Min guard    Ambulation/Gait Assistance Details  Pt wearing R AFO (in different shoe than LLE)    Ambulation Distance (Feet)  230 Feet then 315    Assistive device  Straight cane    Gait Pattern  Step-through pattern;Decreased step length - right;Decreased step length - left;Decreased dorsiflexion - right;Decreased weight shift to right;Trendelenburg;Narrow base of support;Poor foot clearance - right;Antalgic    Ambulation Surface  Level    Gait Comments  Gait with environmental scanning tasks, with min guard assistance and cane.  Gait with conversation tasks x 230 ft no device with supervision.          Balance Exercises - 08/03/17 0851      Balance Exercises: Standing   Standing Eyes Opened  Wide (BOA);Narrow base of support (BOS);Head turns;Foam/compliant surface;Solid surface Head nods, 10 reps    Standing Eyes Closed  Wide (BOA);Narrow base of support (BOS);Solid surface;1  rep;10 secs;Foam/compliant surface Head turns/nods x 5 reps each    SLS with Vectors  Solid surface;Upper extremity assist 1 Single/multiple step taps, then step over balance discs    Retro Gait  Upper extremity support;2 reps forward/back along counter    Sidestepping  Upper extremity support;2 reps At counter    Marching Limitations  On foam cushion:  marching in place x 10 reps    Sit to Stand Time  Sit<>stand x 8 reps from mat surface, cues for foot placement and visual target for upright standing    Other Standing Exercises  Standing on foam:  alternating forward kicks x 10 reps, then alternating forward step taps x 10, alternating side step taps x 10.  Standing on incline of ramp and decline of ramp with cane as support:  head turns x 5, head nods x 5 with min guard assistance.        Review of HEP from last visit-pt return demo understanding.  With compliant surface attempts at [facility] head turns, pt needs UE support for maintaining balance      PT Long Term Goals - 07/17/17 0957      PT LONG TERM GOAL #1   Title  Pt will be independent with HEP to address balance, strength, gait.  TARGET 08/14/17    Time  4    Period  Weeks    Status  New    Target Date  08/14/17      PT LONG TERM GOAL #2   Title  Pt will improve Berg score to at least 44/56 for decreased fall risk.    Time  4    Period  Weeks    Status  New    Target Date  08/14/17      PT LONG TERM GOAL #3   Title  Pt will improve gait velocity to at least 2 ft/sec for improved gait efficiency and safety.    Time  4    Status  New    Target Date  08/14/17      PT LONG TERM GOAL #4   Title  Pt will improve TUG score to less than or equal to 16 seconds for decreased fall risk.    Time  4    Period  Weeks    Status  New    Target Date  08/14/17      PT LONG TERM GOAL #5   Title  Pt  will verbalize understanding of fall prevention in home environment.    Time  4    Period  Weeks    Status  New    Target Date  08/14/17             Plan - 08/03/17 1348    Clinical Impression Statement  Pt with minimal c/o pain today compared to previous session, with increase from 3 to 4/10 at end of session.  Pt wears 2 different shoes today again, in order to accomodate R AFO.  Have previously discussed benefit of wearing the same type shoe on both feet, as when pt wears two different shoes, it seems to accentuate her Trendelenburg gait pattern.  Pt seems to be performing HEP well at home.    Rehab Potential  Good    PT Frequency  2x / week    PT Duration  4 weeks plus eval    PT Treatment/Interventions  ADLs/Self Care Home Management;Gait training;Stair training;Functional mobility training;Therapeutic activities;Therapeutic exercise;Balance training;Orthotic Fit/Training;Patient/family education;Neuromuscular re-education;Manual techniques    PT Next Visit Plan  Work on hip strengthening and compliant surface balance to add to HEP; compliant surface and dynamic gait training    Consulted and Agree with Plan of Care  Patient       Patient will benefit from skilled therapeutic intervention in order to improve the following deficits and impairments:  Abnormal gait, Decreased balance, Decreased mobility, Decreased strength, Difficulty walking, Decreased safety awareness  Visit Diagnosis: Unsteadiness on feet  Other abnormalities of gait and mobility     Problem List Patient Active Problem List   Diagnosis Date Noted  . AF (atrial fibrillation) ([place]) 08/16/2016  . Atrial fibrillation and flutter ([place])   . HTN (hypertension)   . Chronic anticoagulation - Eliquis, CHADS2VASC=2 08/11/2015  . Mild cognitive impairment with memory loss 03/21/2015  . Insomnia 02/11/2015  . 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 Memory loss 02/11/2015  . Hypotension 01/21/2015  . OA (osteoarthritis) of knee 11/20/2014  . Medicare annual wellness visit, subsequent 08/13/2014  . Dyslipidemia 05/07/2014  . Incontinence of feces with  fecal urgency 03/31/2014  . Hot flashes 01/24/2014  . Onychomycosis 07/03/2013  . Personal history of colonic polyps 06/28/2013  . Hypocalcemia 03/29/2013  . Depression with anxiety 03/29/2013  . Migraine 04/27/2012  . Hyperglycemia 04/27/2012  . Thrush 03/23/2012  . Sigmoid colon ulcer 03/23/2012  . Multinodular thyroid 09/02/2011  . CAROTID BRUIT 11/08/2010  . DEGENERATIVE DISC DISEASE, LUMBOSACRAL SPINE - followed by Dr. Ellene Route, [facility]  02/19/2010  . VERTEBRAL FRACTURE 02/19/2010  . LUMBOSACRAL STRAIN, ACUTE 02/19/2010  . BURSITIS, ACROMIOCLAVICULAR, LEFT 09/20/2009  . Irritable bowel syndrome 03/06/2009  . CERVICAL RADICULOPATHY 03/06/2009  . DIARRHEA, RECURRENT 03/06/2009  . GERD 08/04/2008  . Fibromyalgia 08/04/2008  . OSTEOPENIA 08/04/2008    Kaylie Ritter W. 08/03/2017, 1:51 PM Frazier Butt., PT  [facility] [address] [place], [place], [address] Phone: [phone]   Fax:  [phone]  Name: Chelsea Hancock MRN: [id number] Date of Birth: [birth year]

## 2017-08-06 ENCOUNTER — Ambulatory Visit: Payer: Medicare Other | Admitting: Physical Therapy

## 2017-08-06 ENCOUNTER — Encounter: Payer: Self-pay | Admitting: Physical Therapy

## 2017-08-06 DIAGNOSIS — M6281 Muscle weakness (generalized): Secondary | ICD-10-CM | POA: Diagnosis not present

## 2017-08-06 DIAGNOSIS — R2681 Unsteadiness on feet: Secondary | ICD-10-CM | POA: Diagnosis not present

## 2017-08-06 DIAGNOSIS — R2689 Other abnormalities of gait and mobility: Secondary | ICD-10-CM

## 2017-08-06 NOTE — Patient Instructions (Addendum)
Hip Abduction / Adduction: with Knee Flexion (Supine)    With both knees bent and theraband around your knees, gently lower right knee to side and return. Repeat _10___ times per leg.  Do __1-2__ sessions per day.  http://orth.exer.us/683   Copyright  VHI. All rights reserved.  Gluteal Sets    Squeeze pelvic floor and hold. Tighten bottom. Hold for _3__ seconds. Relax for __3_ seconds. Repeat _10__ times. Do _1-2_ times a day.  You can also do this with your knees bent.   Copyright  VHI. All rights reserved.

## 2017-08-06 NOTE — Therapy (Signed)
[facility] [address] [place], [place], [address] Phone: [phone]   Fax:  [phone]  Physical Therapy Treatment  Patient Details  Name: Chelsea Hancock MRN: [id number] Date of Birth: [birth year] Referring Provider: Dr. Lajean Manes   Encounter Date: 08/06/2017  PT End of Session - 08/06/17 0947    Visit Number  5    Number of Visits  9    Date for PT Re-Evaluation  09/14/17    Authorization Type  Medicare primary, [employer] secondary-GCODE every 10th visit    PT Start Time  0840    PT Stop Time  0924    PT Time Calculation (min)  44 min    Equipment Utilized During Treatment  Gait belt    Activity Tolerance  Patient tolerated treatment well    Behavior During Therapy  [facility] for tasks assessed/performed       Past Medical History:  Diagnosis Date  . Atrial fibrillation and flutter ([place])    a. s/p PVI ablation in 07/2015 with continue PAF  b. s/p repeat ablation on 08/15/16. continued on Flecainide 100mg  BID  . Carotid bruit    Right  . Chronic back pain   . Depression with anxiety 03/29/2013  . Diverticulosis   . Fibromyalgia   . Foot drop, right   . GERD (gastroesophageal reflux disease)   . Hemorrhoids   . HTN (hypertension)   . Hyperlipidemia   . IBS (irritable bowel syndrome)   . Memory loss 02/11/2015  . Onychomycosis 07/03/2013  . Osteoporosis   . Sigmoid colon ulcer 03/23/2012    Past Surgical History:  Procedure Laterality Date  . ABDOMINAL HYSTERECTOMY  1981  . BACK SURGERY  1997   reconstructive lower back surgery  . CARDIOVERSION N/A 07/01/2016   Procedure: CARDIOVERSION;  Surgeon: Thayer Headings, MD;  Location: [facility];  Service: Cardiovascular;  Laterality: N/A;  . CARPAL TUNNEL RELEASE Left 2012   ulnar nerve release at elbow  . CERVICAL FUSION  2003, 2005, 2013  . COLONOSCOPY    . ELECTROPHYSIOLOGIC STUDY N/A 08/10/2015   Procedure: Afib;  Surgeon: Will Meredith Leeds, MD;  Location: [place] CV LAB;  Service: Cardiovascular;  Laterality: N/A;  . ELECTROPHYSIOLOGIC STUDY N/A 08/15/2016   Procedure: Atrial Fibrillation Ablation;  Surgeon: Will Meredith Leeds, MD;  Location: [place] CV LAB;  Service: Cardiovascular;  Laterality: N/A;  . EYE SURGERY  2015   b/l cataracts removed  . left elbow surgery  06/03/11   cyst removed  . LUMBAR LAMINECTOMY  1960   x 2  . POSTERIOR CERVICAL FUSION/FORAMINOTOMY  08/03/2012   Procedure: POSTERIOR CERVICAL FUSION/FORAMINOTOMY LEVEL 5;  Surgeon: [name], MD;  Location: [facility];  Service: Neurosurgery;  Laterality: N/A;  Cervical four to Thoracic two Posterior cervical decompression with Facet and Pedicle screw fixation  . TONSILLECTOMY  1943  . TOTAL HIP ARTHROPLASTY Left 2012  . TOTAL KNEE ARTHROPLASTY Right 11/20/2014   Procedure: RIGHT TOTAL KNEE ARTHROPLASTY;  Surgeon: [name], MD;  Location: [facility];  Service: Orthopedics;  Laterality: Right;  . veins stripped  1970  . WRIST SURGERY Right 1990's   cyst removed    There were no vitals filed for this visit.  Subjective Assessment - 08/06/17 0841    Subjective  No changes since last visit.  Not really having much pain today.    Pertinent History  R TKR, L THR, s/p 7 back surgeries due to  ruptured disks, paroxysmal A-fib; R foot drop, GERD, depression    Patient Stated Goals  Pt's goals for therapy to strengthen legs to get stronger and to work on balance.    Currently in Pain?  Yes    Pain Score  5     Pain Location  Knee    Pain Orientation  Left    Pain Descriptors / Indicators  Sharp    Pain Type  Chronic pain    Pain Onset  More than a month ago    Aggravating Factors   when I move it    Pain Relieving Factors  resting, being still                      [facility] Adult PT Treatment/Exercise - 08/06/17 0842      Ambulation/Gait   Ambulation/Gait  Yes    Ambulation/Gait Assistance  5: Supervision;4: Min guard     Ambulation/Gait Assistance Details  Pt wearing R AFO    Ambulation Distance (Feet)  230 Feet x2; 50 ft x 4    Assistive device  None Pt did not bring in cane today.    Gait Pattern  Step-through pattern;Decreased step length - right;Decreased step length - left;Decreased dorsiflexion - right;Decreased weight shift to right;Trendelenburg;Narrow base of support;Poor foot clearance - right;Antalgic    Ambulation Surface  Level;Indoor    Gait Comments  Gait x 230 ft with ball toss, then 230 ft with turning head to look at cards; slowed gait pace but no LOB.      Exercises   Exercises  Knee/Hip      Knee/Hip Exercises: Supine   Bridges  -- Attempted, unable due to back pain    Knee Flexion  AROM;Right;Left;1 set;10 reps Hooklying marching    Other Supine Knee/Hip Exercises  Hooklying hip abduction x 10 reps, then 10 reps with red band, each leg; glut sets x 10 reps          Balance Exercises - 08/06/17 0851      Balance Exercises: Standing   Standing Eyes Opened  Wide (BOA);Narrow base of support (BOS);Head turns;Foam/compliant surface;Solid surface;5 reps head nods; UE support at chair    Standing Eyes Closed  Narrow base of support (BOS);Wide (BOA);Head turns;Foam/compliant surface;Solid surface;5 reps Head nods, UE support at chair    Wall Bumps  Hip    Wall Bumps-Hips  Anterior/posterior;Eyes opened;10 reps 2 sets    Stepping Strategy  Anterior;Posterior;Lateral;UE support;10 reps alternating legs    Marching Limitations  On foam cushion:  marching in place x 10 reps    Other Standing Exercises  Standing on incline/decline of ramp with feet apart, then semi-tandem stance:  EO with head turns x 5 reps, head nods x 5 reps each position.        PT Education - 08/06/17 0947    Education provided  Yes    Education Details  HEP additions-hip strengthening-see instructions    Person(s) Educated  Patient    Methods  Explanation;Demonstration;Handout    Comprehension  Verbalized  understanding;Returned demonstration          PT Long Term Goals - 07/17/17 0957      PT LONG TERM GOAL #1   Title  Pt will be independent with HEP to address balance, strength, gait.  TARGET 08/14/17    Time  4    Period  Weeks    Status  New    Target Date  08/14/17  PT LONG TERM GOAL #2   Title  Pt will improve Berg score to at least 44/56 for decreased fall risk.    Time  4    Period  Weeks    Status  New    Target Date  08/14/17      PT LONG TERM GOAL #3   Title  Pt will improve gait velocity to at least 2 ft/sec for improved gait efficiency and safety.    Time  4    Status  New    Target Date  08/14/17      PT LONG TERM GOAL #4   Title  Pt will improve TUG score to less than or equal to 16 seconds for decreased fall risk.    Time  4    Period  Weeks    Status  New    Target Date  08/14/17      PT LONG TERM GOAL #5   Title  Pt will verbalize understanding of fall prevention in home environment.    Time  4    Period  Weeks    Status  New    Target Date  08/14/17            Plan - 08/06/17 0948    Clinical Impression Statement  Skilled PT session focused on compliant surface balance work as well as balance strategies, in addition to gait and hip strengthening activities.  Pt wearing AFO today, with the matching shoes today, and PT discussed with patient how her walking seems more steady (and less Trendelenburg pattern) noted today.  Pt will continue to benefit from skilled PT to address balance, strengthening and gait.    Rehab Potential  Good    PT Frequency  2x / week    PT Duration  4 weeks plus eval    PT Treatment/Interventions  ADLs/Self Care Home Management;Gait training;Stair training;Functional mobility training;Therapeutic activities;Therapeutic exercise;Balance training;Orthotic Fit/Training;Patient/family education;Neuromuscular re-education;Manual techniques    PT Next Visit Plan  Review HEP, continue to work on hip strengthening and  compliant surface balance to add to HEP; compliant surface and dynamic gait training    Consulted and Agree with Plan of Care  Patient       Patient will benefit from skilled therapeutic intervention in order to improve the following deficits and impairments:  Abnormal gait, Decreased balance, Decreased mobility, Decreased strength, Difficulty walking, Decreased safety awareness  Visit Diagnosis: Unsteadiness on feet  Muscle weakness (generalized)  Other abnormalities of gait and mobility     Problem List Patient Active Problem List   Diagnosis Date Noted  . AF (atrial fibrillation) ([place]) 08/16/2016  . Atrial fibrillation and flutter ([place])   . HTN (hypertension)   . Chronic anticoagulation - Eliquis, CHADS2VASC=2 08/11/2015  . Mild cognitive impairment with memory loss 03/21/2015  . Insomnia 02/11/2015  . 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 Memory loss 02/11/2015  . Hypotension 01/21/2015  . OA (osteoarthritis) of knee 11/20/2014  . Medicare annual wellness visit, subsequent 08/13/2014  . Dyslipidemia 05/07/2014  . Incontinence of feces with fecal urgency 03/31/2014  . Hot flashes 01/24/2014  . Onychomycosis 07/03/2013  . Personal history of colonic polyps 06/28/2013  . Hypocalcemia 03/29/2013  . Depression with anxiety 03/29/2013  . Migraine 04/27/2012  . Hyperglycemia 04/27/2012  . Thrush 03/23/2012  . Sigmoid colon ulcer 03/23/2012  . Multinodular thyroid 09/02/2011  . CAROTID BRUIT 11/08/2010  . DEGENERATIVE DISC DISEASE, LUMBOSACRAL SPINE - followed by Dr. Ellene Route, [facility]  02/19/2010  .  VERTEBRAL FRACTURE 02/19/2010  . LUMBOSACRAL STRAIN, ACUTE 02/19/2010  . BURSITIS, ACROMIOCLAVICULAR, LEFT 09/20/2009  . Irritable bowel syndrome 03/06/2009  . CERVICAL RADICULOPATHY 03/06/2009  . DIARRHEA, RECURRENT 03/06/2009  . GERD 08/04/2008  . Fibromyalgia 08/04/2008  . OSTEOPENIA 08/04/2008    Deneisha Dade W. 08/06/2017, 9:51 AM  Frazier Butt.,  PT   [facility] [address] [place], [place], [address] Phone: [phone]   Fax:  [phone]  Name: Chelsea Hancock MRN: [id number] Date of Birth: August 30, 1934

## 2017-08-10 DIAGNOSIS — H26492 Other secondary cataract, left eye: Secondary | ICD-10-CM | POA: Diagnosis not present

## 2017-08-10 DIAGNOSIS — H40013 Open angle with borderline findings, low risk, bilateral: Secondary | ICD-10-CM | POA: Diagnosis not present

## 2017-08-10 DIAGNOSIS — H04123 Dry eye syndrome of bilateral lacrimal glands: Secondary | ICD-10-CM | POA: Diagnosis not present

## 2017-08-10 DIAGNOSIS — Z961 Presence of intraocular lens: Secondary | ICD-10-CM | POA: Diagnosis not present

## 2017-08-11 ENCOUNTER — Ambulatory Visit: Payer: Medicare Other | Admitting: Physical Therapy

## 2017-08-11 DIAGNOSIS — M6281 Muscle weakness (generalized): Secondary | ICD-10-CM

## 2017-08-11 DIAGNOSIS — R2681 Unsteadiness on feet: Secondary | ICD-10-CM | POA: Diagnosis not present

## 2017-08-11 DIAGNOSIS — R2689 Other abnormalities of gait and mobility: Secondary | ICD-10-CM

## 2017-08-11 NOTE — Therapy (Signed)
[facility] [address] [place], [place], [address] Phone: [phone]   Fax:  [phone]  Physical Therapy Treatment  Patient Details  Name: Chelsea Hancock MRN: [id number] Date of Birth: December 26, 1933 Referring Provider: Dr. Lajean Manes   Encounter Date: 08/11/2017  PT End of Session - 08/11/17 1013    Visit Number  6    Number of Visits  9    Date for PT Re-Evaluation  09/14/17    Authorization Type  Medicare primary, [employer] secondary-GCODE every 10th visit    PT Start Time  0935    PT Stop Time  1013    PT Time Calculation (min)  38 min    Equipment Utilized During Treatment  Gait belt    Activity Tolerance  Patient tolerated treatment well    Behavior During Therapy  [facility] for tasks assessed/performed       Past Medical History:  Diagnosis Date  . Atrial fibrillation and flutter ([place])    a. s/p PVI ablation in 07/2015 with continue PAF  b. s/p repeat ablation on 08/15/16. continued on Flecainide 100mg  BID  . Carotid bruit    Right  . Chronic back pain   . Depression with anxiety 03/29/2013  . Diverticulosis   . Fibromyalgia   . Foot drop, right   . GERD (gastroesophageal reflux disease)   . Hemorrhoids   . HTN (hypertension)   . Hyperlipidemia   . IBS (irritable bowel syndrome)   . Memory loss 02/11/2015  . Onychomycosis 07/03/2013  . Osteoporosis   . Sigmoid colon ulcer 03/23/2012    Past Surgical History:  Procedure Laterality Date  . ABDOMINAL HYSTERECTOMY  1981  . BACK SURGERY  1997   reconstructive lower back surgery  . CARDIOVERSION N/A 07/01/2016   Procedure: CARDIOVERSION;  Surgeon: Thayer Headings, MD;  Location: [facility];  Service: Cardiovascular;  Laterality: N/A;  . CARPAL TUNNEL RELEASE Left 2012   ulnar nerve release at elbow  . CERVICAL FUSION  2003, 2005, 2013  . COLONOSCOPY    . ELECTROPHYSIOLOGIC STUDY N/A 08/10/2015   Procedure: Afib;  Surgeon: Will Meredith Leeds, MD;  Location: [facility];  Service: Cardiovascular;  Laterality: N/A;  . ELECTROPHYSIOLOGIC STUDY N/A 08/15/2016   Procedure: Atrial Fibrillation Ablation;  Surgeon: Will Meredith Leeds, MD;  Location: [facility];  Service: Cardiovascular;  Laterality: N/A;  . EYE SURGERY  2015   b/l cataracts removed  . left elbow surgery  06/03/11   cyst removed  . LUMBAR LAMINECTOMY  1960   x 2  . POSTERIOR CERVICAL FUSION/FORAMINOTOMY  08/03/2012   Procedure: POSTERIOR CERVICAL FUSION/FORAMINOTOMY LEVEL 5;  Surgeon: [name], MD;  Location: [facility];  Service: Neurosurgery;  Laterality: N/A;  Cervical four to Thoracic two Posterior cervical decompression with Facet and Pedicle screw fixation  . TONSILLECTOMY  1943  . TOTAL HIP ARTHROPLASTY Left 2012  . TOTAL KNEE ARTHROPLASTY Right 11/20/2014   Procedure: RIGHT TOTAL KNEE ARTHROPLASTY;  Surgeon: [name], MD;  [facility];  Service: Orthopedics;  Laterality: Right;  . veins stripped  1970  . WRIST SURGERY Right 1990's   cyst removed    There were no vitals filed for this visit.  Subjective Assessment - 08/11/17 1202    Subjective  Pt states no complaints or changes since last visit. Some pain in knee, but she is used to living with the pain.    Pertinent History  R TKR, L THR, s/p 7 back surgeries due to ruptured disks, paroxysmal A-fib; R foot drop, GERD, depression    Patient Stated Goals  Pt's goals for therapy to strengthen legs to get stronger and to work on balance.    Currently in Pain?  Yes    Pain Score  3     Pain Location  Knee    Pain Orientation  Left    Pain Descriptors / Indicators  Constant    Pain Type  Chronic pain    Pain Onset  More than a month ago    Multiple Pain Sites  No       [facility] Adult PT Treatment/Exercise - 08/11/17 0001      Ambulation/Gait   Ambulation/Gait  Yes    Ambulation/Gait Assistance  5: Supervision;4: Min guard    Ambulation/Gait Assistance Details  Pt  wearing R AFO    Ambulation Distance (Feet)  345 Feet    Assistive device  None    Gait Pattern  Step-through pattern;Decreased step length - right;Decreased step length - left;Decreased dorsiflexion - right;Decreased weight shift to right;Trendelenburg;Narrow base of support;Poor foot clearance - right;Antalgic    Ambulation Surface  Level;Indoor    Gait Comments  Gait x 345 with environmental scanning tasks with Min guard for safety.   Gait training on compliant red mat surface, walking forward, backwards, and side stepping. Pt given VC's and Min Assist for safety.       Neuro Re-ed    Neuro Re-ed Details   Pt performed balance exercises in // bars standing on blue foam beam. Pt performed wide and narrow BOS with EO while turning head left/right and up/down; then EC with directional head turns, with noticeable balance deficits. Pt given Min guard to [place] for safety, and VC's to correct posture and to encourage focus on balance.  Pt performed stepping strategies off of blue foam beam, right and left LE's, forward and backwards, with no UE support. Min to Mod Assist needed for safety to perform activity.  Pt performed tandem walk on beam forward and backward with Min Assist.         Knee/Hip Exercises: Supine   Straight Leg Raises  Right;Left;1 set;10 reps for strengthening    Other Supine Knee/Hip Exercises  Hooklying hip abduction x 10 reps each leg; glut sets x 10 reps with 3-sec hold. Performed for strengthening.      Knee/Hip Exercises: Sidelying   Hip ABduction  Right;Left;1 set;10 reps    Other Sidelying Knee/Hip Exercises  Hip extension in side-lying x10 each leg        PT Long Term Goals - 07/17/17 0957      PT LONG TERM GOAL #1   Title  Pt will be independent with HEP to address balance, strength, gait.  TARGET 08/14/17    Time  4    Period  Weeks    Status  New    Target Date  08/14/17      PT LONG TERM GOAL #2   Title  Pt will improve Berg score to at least 44/56 for  decreased fall risk.    Time  4    Period  Weeks    Status  New    Target Date  08/14/17      PT LONG TERM GOAL #3   Title  Pt will improve gait velocity to at least 2 ft/sec for improved gait efficiency and safety.    Time  4  Status  New    Target Date  08/14/17      PT LONG TERM GOAL #4   Title  Pt will improve TUG score to less than or equal to 16 seconds for decreased fall risk.    Time  4    Period  Weeks    Status  New    Target Date  08/14/17      PT LONG TERM GOAL #5   Title  Pt will verbalize understanding of fall prevention in home environment.    Time  4    Period  Weeks    Status  New    Target Date  08/14/17        Plan - 08/11/17 1158    Clinical Impression Statement  Session focused on gait training and balance activities. Pt ambulated well while performing scanning tasks. Pt challenged with balance deficits on most balance activities (narrow BOS, EC, compliant surfaces, stepping strategies), and will benefit from further skilled PT sessions to increase balance and strength for safety and to maintain functional independence.    Rehab Potential  Good    PT Frequency  2x / week    PT Duration  4 weeks    PT Treatment/Interventions  ADLs/Self Care Home Management;Gait training;Stair training;Functional mobility training;Therapeutic activities;Therapeutic exercise;Balance training;Orthotic Fit/Training;Patient/family education;Neuromuscular re-education;Manual techniques    PT Next Visit Plan  Check LTG's; continue to work on hip strengthening and compliant surface balance to add to HEP; dynamic gait training.    Consulted and Agree with Plan of Care  Patient       Patient will benefit from skilled therapeutic intervention in order to improve the following deficits and impairments:  Abnormal gait, Decreased balance, Decreased mobility, Decreased strength, Difficulty walking, Decreased safety awareness  Visit Diagnosis: Unsteadiness on feet  Muscle weakness  (generalized)  Other abnormalities of gait and mobility     Problem List Patient Active Problem List   Diagnosis Date Noted  . AF (atrial fibrillation) ([place]) 08/16/2016  . Atrial fibrillation and flutter ([place])   . HTN (hypertension)   . Chronic anticoagulation - Eliquis, CHADS2VASC=2 08/11/2015  . Mild cognitive impairment with memory loss 03/21/2015  . Insomnia 02/11/2015  . 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 Memory loss 02/11/2015  . Hypotension 01/21/2015  . OA (osteoarthritis) of knee 11/20/2014  . Medicare annual wellness visit, subsequent 08/13/2014  . Dyslipidemia 05/07/2014  . Incontinence of feces with fecal urgency 03/31/2014  . Hot flashes 01/24/2014  . Onychomycosis 07/03/2013  . Personal history of colonic polyps 06/28/2013  . Hypocalcemia 03/29/2013  . Depression with anxiety 03/29/2013  . Migraine 04/27/2012  . Hyperglycemia 04/27/2012  . Thrush 03/23/2012  . Sigmoid colon ulcer 03/23/2012  . Multinodular thyroid 09/02/2011  . CAROTID BRUIT 11/08/2010  . DEGENERATIVE DISC DISEASE, LUMBOSACRAL SPINE - followed by Dr. Ellene Route, [facility]  02/19/2010  . VERTEBRAL FRACTURE 02/19/2010  . LUMBOSACRAL STRAIN, ACUTE 02/19/2010  . BURSITIS, ACROMIOCLAVICULAR, LEFT 09/20/2009  . Irritable bowel syndrome 03/06/2009  . CERVICAL RADICULOPATHY 03/06/2009  . DIARRHEA, RECURRENT 03/06/2009  . GERD 08/04/2008  . Fibromyalgia 08/04/2008  . OSTEOPENIA 08/04/2008    Andria Meuse, SPTA 08/11/2017, 12:06 PM  [facility] [address] [place], [place], [address] Phone: [phone]   Fax:  [phone]  Name: Chelsea Hancock MRN: [id number] Date of Birth: 09/08/1934

## 2017-08-18 ENCOUNTER — Encounter: Payer: Self-pay | Admitting: Physical Therapy

## 2017-08-18 ENCOUNTER — Ambulatory Visit: Payer: Medicare Other | Admitting: Physical Therapy

## 2017-08-18 DIAGNOSIS — R2681 Unsteadiness on feet: Secondary | ICD-10-CM

## 2017-08-18 DIAGNOSIS — R2689 Other abnormalities of gait and mobility: Secondary | ICD-10-CM

## 2017-08-18 DIAGNOSIS — M6281 Muscle weakness (generalized): Secondary | ICD-10-CM | POA: Diagnosis not present

## 2017-08-18 NOTE — Therapy (Signed)
[facility] [address] [place], [place], [address] Phone: [phone]   Fax:  [phone]  Physical Therapy Treatment  Patient Details  Name: Chelsea Hancock MRN: [id number] Date of Birth: [birth year] Referring Provider: Dr. Lajean Manes   Encounter Date: 08/18/2017  PT End of Session - 08/18/17 1026    Visit Number  7    Number of Visits  9    Date for PT Re-Evaluation  09/14/17    Authorization Type  Medicare primary, [employer] secondary-GCODE every 10th visit    PT Start Time  0933    PT Stop Time  1013    PT Time Calculation (min)  40 min    Equipment Utilized During Treatment  Gait belt    Activity Tolerance  Patient tolerated treatment well PT overall in increased pain today     Behavior During Therapy  [facility] for tasks assessed/performed       Past Medical History:  Diagnosis Date  . Atrial fibrillation and flutter ([place])    a. s/p PVI ablation in 07/2015 with continue PAF  b. s/p repeat ablation on 08/15/16. continued on Flecainide 133m BID  . Carotid bruit    Right  . Chronic back pain   . Depression with anxiety 03/29/2013  . Diverticulosis   . Fibromyalgia   . Foot drop, right   . GERD (gastroesophageal reflux disease)   . Hemorrhoids   . HTN (hypertension)   . Hyperlipidemia   . IBS (irritable bowel syndrome)   . Memory loss 02/11/2015  . Onychomycosis 07/03/2013  . Osteoporosis   . Sigmoid colon ulcer 03/23/2012    Past Surgical History:  Procedure Laterality Date  . ABDOMINAL HYSTERECTOMY  1981  . BACK SURGERY  1997   reconstructive lower back surgery  . CARDIOVERSION N/A 07/01/2016   Procedure: CARDIOVERSION;  Surgeon: PThayer Headings MD;  Location: [facility]  Service: Cardiovascular;  Laterality: N/A;  . CARPAL TUNNEL RELEASE Left 2012   ulnar nerve release at elbow  . CERVICAL FUSION  2003, 2005, 2013  . COLONOSCOPY    . ELECTROPHYSIOLOGIC STUDY N/A 08/10/2015    Procedure: Afib;  Surgeon: Will MMeredith Leeds MD;  [facility];  Service: Cardiovascular;  Laterality: N/A;  . ELECTROPHYSIOLOGIC STUDY N/A 08/15/2016   Procedure: Atrial Fibrillation Ablation;  Surgeon: Will MMeredith Leeds MD;  [facility];  Service: Cardiovascular;  Laterality: N/A;  . EYE SURGERY  2015   b/l cataracts removed  . left elbow surgery  06/03/11   cyst removed  . LUMBAR LAMINECTOMY  1960   x 2  . POSTERIOR CERVICAL FUSION/FORAMINOTOMY  08/03/2012   Procedure: POSTERIOR CERVICAL FUSION/FORAMINOTOMY LEVEL 5;  Surgeon: HKristeen Miss MD;  [facility];  Service: Neurosurgery;  Laterality: N/A;  Cervical four to Thoracic two Posterior cervical decompression with Facet and Pedicle screw fixation  . TONSILLECTOMY  1943  . TOTAL HIP ARTHROPLASTY Left 2012  . TOTAL KNEE ARTHROPLASTY Right 11/20/2014   Procedure: RIGHT TOTAL KNEE ARTHROPLASTY;  Surgeon: FGearlean Alf MD;  [facility];  Service: Orthopedics;  Laterality: Right;  . veins stripped  1970  . WRIST SURGERY Right 1990's   cyst removed    There were no vitals filed for this visit.  Subjective Assessment - 08/18/17 0935    Subjective  I'm hurting a lot today, since yesterday-think I woke up from the pain.  Hurt the whole day.  Pertinent History  R TKR, L THR, s/p 7 back surgeries due to ruptured disks, paroxysmal A-fib; R foot drop, GERD, depression    Patient Stated Goals  Pt's goals for therapy to strengthen legs to get stronger and to work on balance.    Currently in Pain?  Yes    Pain Score  9     Pain Location  Leg    Pain Orientation  Left;Lateral    Pain Descriptors / Indicators  Sharp    Pain Type  Chronic pain but worse today and yesterday    Pain Onset  More than a month ago    Pain Frequency  Constant    Aggravating Factors   when I press my weight on it it hurts    Pain Relieving Factors  off-weighting left leg                       [facility] Adult PT Treatment/Exercise - 08/18/17 0939      Ambulation/Gait   Ambulation/Gait  Yes    Ambulation/Gait Assistance  5: Supervision;6: Modified independent (Device/Increase time)    Ambulation/Gait Assistance Details  Pt wearing R AFO    Ambulation Distance (Feet)  200 Feet    Assistive device  None    Gait Pattern  Step-through pattern;Decreased step length - right;Decreased step length - left;Decreased dorsiflexion - right;Decreased weight shift to right;Trendelenburg;Narrow base of support;Poor foot clearance - right;Antalgic    Ambulation Surface  Level;Indoor    Gait velocity  20.57 sec with cane; (1.59 ft/sec)       Standardized Balance Assessment   Standardized Balance Assessment  Timed Up and Go Test;Berg Balance Test      Berg Balance Test   Sit to Stand  Able to stand  independently using hands    Standing Unsupported  Able to stand safely 2 minutes    Sitting with Back Unsupported but Feet Supported on Floor or Stool  Able to sit safely and securely 2 minutes    Stand to Sit  Sits safely with minimal use of hands    Transfers  Able to transfer safely, definite need of hands    Standing Unsupported with Eyes Closed  Able to stand 10 seconds safely    Standing Ubsupported with Feet Together  Able to place feet together independently and stand 1 minute safely    From Standing, Reach Forward with Outstretched Arm  Can reach forward >12 cm safely (5") 8"    From Standing Position, Pick up Object from Floor  Able to pick up shoe safely and easily    From Standing Position, Turn to Look Behind Over each Shoulder  Looks behind from both sides and weight shifts well    Turn 360 Degrees  Able to turn 360 degrees safely but slowly    Standing Unsupported, Alternately Place Feet on Step/Stool  Able to complete >2 steps/needs minimal assist    Standing Unsupported, One Foot in Front  Able to plae foot ahead of the other independently and hold 30  seconds    Standing on One Leg  Tries to lift leg/unable to hold 3 seconds but remains standing independently    Total Score  44      Timed Up and Go Test   TUG  Normal TUG    Normal TUG (seconds)  20.57 20.12 sec; 19.85 sec (20.18 sec avg)      Self-Care   Self-Care  Other Self-Care Comments  Other Self-Care Comments   Discussed results of objective measures compared to eval; discussed fall prevention education and provided handout      Asssessed leg length with pt in supine, with initial glance, LLE appears slightly shorter; however, upon measurement from greater trochanter to medial malleolus bilaterally is 30 cm.  (Tried to assess to see if leg length difference is contributing to Trendelenburg pattern and pain)       PT Education - 08/18/17 1025    Education provided  Yes    Education Details  Fall prevention, results of Berg, TUG, and gait velocity    Person(s) Educated  Patient    Methods  Explanation;Handout    Comprehension  Returned demonstration;Verbalized understanding          PT Long Term Goals - 08/18/17 1030      PT LONG TERM GOAL #1   Title  Pt will be independent with HEP to address balance, strength, gait.  TARGET 08/14/17    Time  4    Period  Weeks    Status  New      PT LONG TERM GOAL #2   Title  Pt will improve Berg score to at least 44/56 for decreased fall risk.    Time  4    Period  Weeks    Status  Achieved      PT LONG TERM GOAL #3   Title  Pt will improve gait velocity to at least 2 ft/sec for improved gait efficiency and safety.    Time  4    Status  Not Met      PT LONG TERM GOAL #4   Title  Pt will improve TUG score to less than or equal to 16 seconds for decreased fall risk.    Time  4    Period  Weeks    Status  Not Met      PT LONG TERM GOAL #5   Title  Pt will verbalize understanding of fall prevention in home environment.    Time  4    Period  Weeks    Status  Achieved            Plan - 08/18/17 1026     Clinical Impression Statement  Session focused today on beginning checking LTGs.  Pt has improved Berg score to 44/56; however, TUG and gait velocity have not improved (perhaps due to pt's increased c/o pain today).  Pt has varied complaints of pain in thigh, knee, and back throughout session, but pt does not really want to address because she says it "just comes and goes."  Pt feels comfortable with plans for d/c next visit.    Rehab Potential  Good    PT Frequency  2x / week    PT Duration  4 weeks    PT Treatment/Interventions  ADLs/Self Care Home Management;Gait training;Stair training;Functional mobility training;Therapeutic activities;Therapeutic exercise;Balance training;Orthotic Fit/Training;Patient/family education;Neuromuscular re-education;Manual techniques    PT Next Visit Plan  Check remaining LTGs; check full HEP; plan for d/c next visit.    Consulted and Agree with Plan of Care  Patient       Patient will benefit from skilled therapeutic intervention in order to improve the following deficits and impairments:  Abnormal gait, Decreased balance, Decreased mobility, Decreased strength, Difficulty walking, Decreased safety awareness  Visit Diagnosis: Unsteadiness on feet  Other abnormalities of gait and mobility     Problem List Patient Active Problem List   Diagnosis Date Noted  .  AF (atrial fibrillation) ([place]) 08/16/2016  . Atrial fibrillation and flutter ([place])   . HTN (hypertension)   . Chronic anticoagulation - Eliquis, CHADS2VASC=2 08/11/2015  . Mild cognitive impairment with memory loss 03/21/2015  . Insomnia 02/11/2015  . 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 Memory loss 02/11/2015  . Hypotension 01/21/2015  . OA (osteoarthritis) of knee 11/20/2014  . Medicare annual wellness visit, subsequent 08/13/2014  . Dyslipidemia 05/07/2014  . Incontinence of feces with fecal urgency 03/31/2014  . Hot flashes 01/24/2014  . Onychomycosis 07/03/2013  . Personal history of  colonic polyps 06/28/2013  . Hypocalcemia 03/29/2013  . Depression with anxiety 03/29/2013  . Migraine 04/27/2012  . Hyperglycemia 04/27/2012  . Thrush 03/23/2012  . Sigmoid colon ulcer 03/23/2012  . Multinodular thyroid 09/02/2011  . CAROTID BRUIT 11/08/2010  . DEGENERATIVE DISC DISEASE, LUMBOSACRAL SPINE - followed by Dr. Ellene Route, [facility] Neurosurgical  02/19/2010  . VERTEBRAL FRACTURE 02/19/2010  . LUMBOSACRAL STRAIN, ACUTE 02/19/2010  . BURSITIS, ACROMIOCLAVICULAR, LEFT 09/20/2009  . Irritable bowel syndrome 03/06/2009  . CERVICAL RADICULOPATHY 03/06/2009  . DIARRHEA, RECURRENT 03/06/2009  . GERD 08/04/2008  . Fibromyalgia 08/04/2008  . OSTEOPENIA 08/04/2008    Chelsea Lenhardt W. 08/18/2017, 10:31 AM  [facility] [address] [place], [place], [address] Phone: [phone]   Fax:  [phone]  Name: Chelsea Hancock MRN: [id number] Date of Birth: [birth year]

## 2017-08-18 NOTE — Patient Instructions (Addendum)

## 2017-08-20 ENCOUNTER — Encounter: Payer: Self-pay | Admitting: Physical Therapy

## 2017-08-20 ENCOUNTER — Ambulatory Visit: Payer: Medicare Other | Admitting: Physical Therapy

## 2017-08-20 DIAGNOSIS — R2689 Other abnormalities of gait and mobility: Secondary | ICD-10-CM

## 2017-08-20 DIAGNOSIS — M6281 Muscle weakness (generalized): Secondary | ICD-10-CM | POA: Diagnosis not present

## 2017-08-20 DIAGNOSIS — R2681 Unsteadiness on feet: Secondary | ICD-10-CM | POA: Diagnosis not present

## 2017-08-21 NOTE — Therapy (Signed)
[facility] [address] [place], [place], [address] Phone: [phone]   Fax:  [phone]  Physical Therapy Treatment  Patient Details  Name: Chelsea Hancock MRN: [id number] Date of Birth: [birth year] Referring Provider: Dr. Lajean Manes   Encounter Date: 08/20/2017  PT End of Session - 08/21/17 1407    Visit Number  8    Number of Visits  9    Date for PT Re-Evaluation  09/14/17    Authorization Type  Medicare primary, [employer] secondary-GCODE every 10th visit    PT Start Time  [phone]    PT Stop Time  1000 session ended due to patient completing goal check and ready for d/c    PT Time Calculation (min)  24 min    Equipment Utilized During Treatment  Gait belt    Activity Tolerance  Patient tolerated treatment well    Behavior During Therapy  [facility] for tasks assessed/performed       Past Medical History:  Diagnosis Date  . Atrial fibrillation and flutter ([place])    a. s/p PVI ablation in 07/2015 with continue PAF  b. s/p repeat ablation on 08/15/16. continued on Flecainide 166m BID  . Carotid bruit    Right  . Chronic back pain   . Depression with anxiety 03/29/2013  . Diverticulosis   . Fibromyalgia   . Foot drop, right   . GERD (gastroesophageal reflux disease)   . Hemorrhoids   . HTN (hypertension)   . Hyperlipidemia   . IBS (irritable bowel syndrome)   . Memory loss 02/11/2015  . Onychomycosis 07/03/2013  . Osteoporosis   . Sigmoid colon ulcer 03/23/2012    Past Surgical History:  Procedure Laterality Date  . ABDOMINAL HYSTERECTOMY  1981  . BACK SURGERY  1997   reconstructive lower back surgery  . CARDIOVERSION N/A 07/01/2016   Procedure: CARDIOVERSION;  Surgeon: PThayer Headings MD;  [place] [facility]  Service: Cardiovascular;  Laterality: N/A;  . CARPAL TUNNEL RELEASE Left 2012   ulnar nerve release at elbow  . CERVICAL FUSION  2003, 2005, 2013  . COLONOSCOPY    .  ELECTROPHYSIOLOGIC STUDY N/A 08/10/2015   Procedure: Afib;  Surgeon: Will MMeredith Leeds MD;  [facility];  Service: Cardiovascular;  Laterality: N/A;  . ELECTROPHYSIOLOGIC STUDY N/A 08/15/2016   Procedure: Atrial Fibrillation Ablation;  Surgeon: Will MMeredith Leeds MD;  [facility];  Service: Cardiovascular;  Laterality: N/A;  . EYE SURGERY  2015   b/l cataracts removed  . left elbow surgery  06/03/11   cyst removed  . LUMBAR LAMINECTOMY  1960   x 2  . POSTERIOR CERVICAL FUSION/FORAMINOTOMY  08/03/2012   Procedure: POSTERIOR CERVICAL FUSION/FORAMINOTOMY LEVEL 5;  Surgeon: HKristeen Miss MD;  [facility];  Service: Neurosurgery;  Laterality: N/A;  Cervical four to Thoracic two Posterior cervical decompression with Facet and Pedicle screw fixation  . TONSILLECTOMY  1943  . TOTAL HIP ARTHROPLASTY Left 2012  . TOTAL KNEE ARTHROPLASTY Right 11/20/2014   Procedure: RIGHT TOTAL KNEE ARTHROPLASTY;  Surgeon: FGearlean Alf MD;  [facility];  Service: Orthopedics;  Laterality: Right;  . veins stripped  1970  . WRIST SURGERY Right 1990's   cyst removed    There were no vitals filed for this visit.  Subjective Assessment - 08/20/17 0939    Subjective  Not hurting as much today as the other day-only my kneecap is hurting.  Pertinent History  R TKR, L THR, s/p 7 back surgeries due to ruptured disks, paroxysmal A-fib; R foot drop, GERD, depression    Patient Stated Goals  Pt's goals for therapy to strengthen legs to get stronger and to work on balance.    Currently in Pain?  Yes    Pain Score  6     Pain Location  Knee    Pain Orientation  Left    Pain Descriptors / Indicators  Sharp    Pain Onset  More than a month ago    Pain Frequency  Intermittent    Aggravating Factors   moving it    Pain Relieving Factors  being off of it                      [facility] Adult PT Treatment/Exercise - 08/20/17 0955      Ambulation/Gait    Ambulation/Gait  Yes    Ambulation/Gait Assistance  6: Modified independent (Device/Increase time)    Ambulation/Gait Assistance Details  Pt wears R AFO, uses cane; less pain, less antalgic gait pattern today.    Ambulation Distance (Feet)  100 Feet then 230 ft, 50 ft x 4    Assistive device  Straight cane    Gait Pattern  Step-through pattern;Decreased step length - right;Decreased step length - left;Decreased dorsiflexion - right;Decreased weight shift to right;Trendelenburg;Narrow base of support;Poor foot clearance - right    Ambulation Surface  Level;Indoor    Gait velocity  2.27 ft/sec    Gait Comments  Gait activities with and without cane today, changing speeds, simulating pt's work environment      Timed Up and Go Test   TUG  Normal TUG    Normal TUG (seconds)  16.25 16.85 sec with TUG      Exercises   Other Exercises   Pt performs full review of HEP:  corner balance exercises, lower extremity strengthening exercises supine on mat-pt return demo understanding.  With corner balance exercises:  discussed/practiced progression of exercises with pt performing head turns/head nods x 5 reps each with EC, feet apart and feet together.  Pt needs UE support for feet together/EC with head movements                  PT Long Term Goals - 08/20/17 0949      PT LONG TERM GOAL #1   Title  Pt will be independent with HEP to address balance, strength, gait.  TARGET 08/14/17    Time  4    Period  Weeks    Status  Achieved      PT LONG TERM GOAL #2   Title  Pt will improve Berg score to at least 44/56 for decreased fall risk.    Time  4    Period  Weeks    Status  Achieved      PT LONG TERM GOAL #3   Title  Pt will improve gait velocity to at least 2 ft/sec for improved gait efficiency and safety.    Baseline  2.27 ft/sec    Time  4    Status  Achieved      PT LONG TERM GOAL #4   Title  Pt will improve TUG score to less than or equal to 16 seconds for decreased fall risk.     Baseline  16.25    Time  4    Period  Weeks    Status  Not Met  PT LONG TERM GOAL #5   Title  Pt will verbalize understanding of fall prevention in home environment.    Time  4    Period  Weeks    Status  Achieved            Plan - 09/16/17 1408    Clinical Impression Statement  Pt with less c/o pain today, and pt has improved TUG and gait velocity scores today versus last visit.  Discussed fluctuations in mobility with changes in pain and need for use of cane, slowed pace, and frequent rest breaks if pt in more pain, due to more unsteady gait pattern.  Pt return demo understanding of HEP; Pt's LTG 1, 2, 4 met.  LTG 3 not met for TUG score.  Pt is appropriate for discharge at this time.    Rehab Potential  Good    PT Frequency  2x / week    PT Duration  4 weeks    PT Treatment/Interventions  ADLs/Self Care Home Management;Gait training;Stair training;Functional mobility training;Therapeutic activities;Therapeutic exercise;Balance training;Orthotic Fit/Training;Patient/family education;Neuromuscular re-education;Manual techniques    PT Next Visit Plan  Discharge this visit    Consulted and Agree with Plan of Care  Patient       Patient will benefit from skilled therapeutic intervention in order to improve the following deficits and impairments:  Abnormal gait, Decreased balance, Decreased mobility, Decreased strength, Difficulty walking, Decreased safety awareness  Visit Diagnosis: Unsteadiness on feet  Other abnormalities of gait and mobility  Muscle weakness (generalized)   G-Codes - 2017/09/16 1410    Functional Assessment Tool Used (Outpatient Only)  gait velocity 2.27 ft/sec, TUG 16.25 sec, Berg 44/56; no reported falls during course of therapy sessions    Functional Limitation  Mobility: Walking and moving around    Mobility: Walking and Moving Around Goal Status [phone])  At least 20 percent but less than 40 percent impaired, limited or restricted    Mobility:  Walking and Moving Around Discharge Status [phone])  At least 20 percent but less than 40 percent impaired, limited or restricted       Problem List Patient Active Problem List   Diagnosis Date Noted  . AF (atrial fibrillation) ([place]) 08/16/2016  . Atrial fibrillation and flutter ([place])   . HTN (hypertension)   . Chronic anticoagulation - Eliquis, CHADS2VASC=2 08/11/2015  . Mild cognitive impairment with memory loss 03/21/2015  . Insomnia 02/11/2015  . 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 Memory loss 02/11/2015  . Hypotension 01/21/2015  . OA (osteoarthritis) of knee 11/20/2014  . Medicare annual wellness visit, subsequent 08/13/2014  . Dyslipidemia 05/07/2014  . Incontinence of feces with fecal urgency 03/31/2014  . Hot flashes 01/24/2014  . Onychomycosis 07/03/2013  . Personal history of colonic polyps 06/28/2013  . Hypocalcemia 03/29/2013  . Depression with anxiety 03/29/2013  . Migraine 04/27/2012  . Hyperglycemia 04/27/2012  . Thrush 03/23/2012  . Sigmoid colon ulcer 03/23/2012  . Multinodular thyroid 09/02/2011  . CAROTID BRUIT 11/08/2010  . DEGENERATIVE DISC DISEASE, LUMBOSACRAL SPINE - followed by Dr. Ellene Route, [facility]  02/19/2010  . VERTEBRAL FRACTURE 02/19/2010  . LUMBOSACRAL STRAIN, ACUTE 02/19/2010  . BURSITIS, ACROMIOCLAVICULAR, LEFT 09/20/2009  . Irritable bowel syndrome 03/06/2009  . CERVICAL RADICULOPATHY 03/06/2009  . DIARRHEA, RECURRENT 03/06/2009  . GERD 08/04/2008  . Fibromyalgia 08/04/2008  . OSTEOPENIA 08/04/2008    MARRIOTT,AMY W. 2017/09/16, 2:15 PM  Frazier Butt., PT   [facility] [address] [place], [place],  [address] Phone: [phone]   Fax:  [phone]  Name: Chelsea Hancock MRN: [id number] Date of Birth: 23-Oct-1933   PHYSICAL THERAPY DISCHARGE SUMMARY  Visits from Start of Care: 8  Current functional level related to goals / functional  outcomes: PT Long Term Goals - 08/20/17 0949      PT LONG TERM GOAL #1   Title  Pt will be independent with HEP to address balance, strength, gait.  TARGET 08/14/17    Time  4    Period  Weeks    Status  Achieved      PT LONG TERM GOAL #2   Title  Pt will improve Berg score to at least 44/56 for decreased fall risk.    Time  4    Period  Weeks    Status  Achieved      PT LONG TERM GOAL #3   Title  Pt will improve gait velocity to at least 2 ft/sec for improved gait efficiency and safety.    Baseline  2.27 ft/sec    Time  4    Status  Achieved      PT LONG TERM GOAL #4   Title  Pt will improve TUG score to less than or equal to 16 seconds for decreased fall risk.    Baseline  16.25    Time  4    Period  Weeks    Status  Not Met      PT LONG TERM GOAL #5   Title  Pt will verbalize understanding of fall prevention in home environment.    Time  4    Period  Weeks    Status  Achieved      Pt has met 4 of 5 LTGs.    Remaining deficits: Pain (chronic in nature and flucutates); balance and gait   Education / Equipment: Educated in [employer] and fall prevention, with pt return demo/verbalizing understanding.  Plan: Patient agrees to discharge.  Patient goals were partially met. Patient is being discharged due to being pleased with the current functional level.  ?????      Mady Haagensen, PT 08/21/17 2:16 PM Phone: [phone] Fax: [phone]

## 2017-09-03 DIAGNOSIS — C44519 Basal cell carcinoma of skin of other part of trunk: Secondary | ICD-10-CM | POA: Diagnosis not present

## 2017-09-03 DIAGNOSIS — C44529 Squamous cell carcinoma of skin of other part of trunk: Secondary | ICD-10-CM | POA: Diagnosis not present

## 2017-09-08 DIAGNOSIS — H26492 Other secondary cataract, left eye: Secondary | ICD-10-CM | POA: Diagnosis not present

## 2017-09-29 ENCOUNTER — Telehealth: Payer: Self-pay | Admitting: Cardiology

## 2017-09-29 NOTE — Telephone Encounter (Signed)
Patient daughter (christine) calling, states she is returning call.

## 2017-10-15 ENCOUNTER — Other Ambulatory Visit: Payer: Self-pay | Admitting: Cardiology

## 2017-10-16 NOTE — Telephone Encounter (Signed)
Pt last saw Dr Curt Bears 11/18/16, pt is overdue for 6 month f/u with Dr Curt Bears. Last labs in chart from 2017, called pt's PCP requested labwork.  Labs received from Dr Carlyle Lipa office 07/06/17 Creat 0.70, age 84, weight 67.1kg, based on specified criteria pt is on appropriate dosage of Eliquis 5mg  BID.  Will refill rx x 3 months with note to schedule f/u appt with Dr Curt Bears, overdue for 6 month follow-up.

## 2017-10-18 ENCOUNTER — Telehealth: Payer: Self-pay | Admitting: Physician Assistant

## 2017-10-18 NOTE — Telephone Encounter (Signed)
Chelsea Hancock is a 84 y.o. female with a history of persistent atrial fibrillation status post ablation x2.  She remains on apixaban for anticoagulation.  She is also on flecainide as well as bisoprolol.  She called in with rapid palpitations since Friday 10/16/17.  Typically, she can take an extra 1/2 bisoprolol and her symptoms will abate.  This has gone on longer than usual.  She feels a little lightheaded but denies chest pain, shortness of breath, edema. PLAN: 1.  Change bisoprolol to 5 mg twice daily 2.  Monitor blood pressure-if heart rate 140 or higher, go to the emergency room 3.  If more symptomatic, go to the emergency room 4.  I will asked the office to arrange follow-up with EP at [facility] or the A Fib clinic on Monday, 10/19/17. Richardson Dopp, PA-C    10/18/2017 9:38 AM

## 2017-10-19 ENCOUNTER — Encounter (HOSPITAL_COMMUNITY): Payer: Self-pay | Admitting: Nurse Practitioner

## 2017-10-19 ENCOUNTER — Ambulatory Visit (HOSPITAL_COMMUNITY)
Admission: RE | Admit: 2017-10-19 | Discharge: 2017-10-19 | Disposition: A | Discharge status: Home / Self Care | Payer: Medicare Other | Source: Ambulatory Visit | Attending: Nurse Practitioner | Admitting: Nurse Practitioner

## 2017-10-19 VITALS — BP 114/82 | HR 120 | Ht 63.0 in | Wt 140.0 lb

## 2017-10-19 DIAGNOSIS — Z7901 Long term (current) use of anticoagulants: Secondary | ICD-10-CM | POA: Insufficient documentation

## 2017-10-19 DIAGNOSIS — K219 Gastro-esophageal reflux disease without esophagitis: Secondary | ICD-10-CM | POA: Insufficient documentation

## 2017-10-19 DIAGNOSIS — M797 Fibromyalgia: Secondary | ICD-10-CM | POA: Diagnosis not present

## 2017-10-19 DIAGNOSIS — K589 Irritable bowel syndrome without diarrhea: Secondary | ICD-10-CM | POA: Insufficient documentation

## 2017-10-19 DIAGNOSIS — Z882 Allergy status to sulfonamides status: Secondary | ICD-10-CM | POA: Insufficient documentation

## 2017-10-19 DIAGNOSIS — I480 Paroxysmal atrial fibrillation: Secondary | ICD-10-CM | POA: Insufficient documentation

## 2017-10-19 DIAGNOSIS — E785 Hyperlipidemia, unspecified: Secondary | ICD-10-CM | POA: Diagnosis not present

## 2017-10-19 DIAGNOSIS — Z87891 Personal history of nicotine dependence: Secondary | ICD-10-CM | POA: Insufficient documentation

## 2017-10-19 DIAGNOSIS — I10 Essential (primary) hypertension: Secondary | ICD-10-CM | POA: Diagnosis not present

## 2017-10-19 DIAGNOSIS — Z800 Family history of malignant neoplasm of digestive organs: Secondary | ICD-10-CM | POA: Diagnosis not present

## 2017-10-19 DIAGNOSIS — I4892 Unspecified atrial flutter: Secondary | ICD-10-CM | POA: Diagnosis not present

## 2017-10-19 DIAGNOSIS — Z79899 Other long term (current) drug therapy: Secondary | ICD-10-CM | POA: Insufficient documentation

## 2017-10-19 DIAGNOSIS — Z818 Family history of other mental and behavioral disorders: Secondary | ICD-10-CM | POA: Insufficient documentation

## 2017-10-19 DIAGNOSIS — Z96642 Presence of left artificial hip joint: Secondary | ICD-10-CM | POA: Diagnosis not present

## 2017-10-19 DIAGNOSIS — G8929 Other chronic pain: Secondary | ICD-10-CM | POA: Diagnosis not present

## 2017-10-19 DIAGNOSIS — Z823 Family history of stroke: Secondary | ICD-10-CM | POA: Insufficient documentation

## 2017-10-19 DIAGNOSIS — Z96651 Presence of right artificial knee joint: Secondary | ICD-10-CM | POA: Insufficient documentation

## 2017-10-19 DIAGNOSIS — F418 Other specified anxiety disorders: Secondary | ICD-10-CM | POA: Insufficient documentation

## 2017-10-19 DIAGNOSIS — Z9889 Other specified postprocedural states: Secondary | ICD-10-CM | POA: Diagnosis not present

## 2017-10-19 DIAGNOSIS — Z885 Allergy status to narcotic agent status: Secondary | ICD-10-CM | POA: Diagnosis not present

## 2017-10-19 DIAGNOSIS — Z8249 Family history of ischemic heart disease and other diseases of the circulatory system: Secondary | ICD-10-CM | POA: Diagnosis not present

## 2017-10-19 DIAGNOSIS — Z888 Allergy status to other drugs, medicaments and biological substances status: Secondary | ICD-10-CM | POA: Insufficient documentation

## 2017-10-19 DIAGNOSIS — I484 Atypical atrial flutter: Secondary | ICD-10-CM | POA: Insufficient documentation

## 2017-10-19 LAB — BASIC METABOLIC PANEL
Anion gap: 11 (ref 5–15)
BUN: 20 mg/dL (ref 6–20)
CO2: 24 mmol/L (ref 22–32)
Calcium: 9.4 mg/dL (ref 8.9–10.3)
Chloride: 105 mmol/L (ref 101–111)
Creatinine, Ser: 0.66 mg/dL (ref 0.44–1.00)
GFR calc Af Amer: >60 mL/min (ref >60)
GFR calc non Af Amer: >60 mL/min (ref >60)
Glucose, Bld: 102 mg/dL — ABNORMAL HIGH (ref 65–99)
Potassium: 4 mmol/L (ref 3.5–5.1)
Sodium: 140 mmol/L (ref 135–145)

## 2017-10-19 LAB — CBC
HCT: 42.4 % (ref 36.0–46.0)
Hemoglobin: 14 g/dL (ref 12.0–15.0)
MCH: 30.6 pg (ref 26.0–34.0)
MCHC: 33 g/dL (ref 30.0–36.0)
MCV: 92.6 fL (ref 78.0–100.0)
Platelets: 251 10*3/uL (ref 150–400)
RBC: 4.58 MIL/uL (ref 3.87–5.11)
RDW: 14.3 % (ref 11.5–15.5)
WBC: 7.4 10*3/uL (ref 4.0–10.5)

## 2017-10-19 LAB — TSH: TSH: 3.796 u[IU]/mL (ref 0.350–4.500)

## 2017-10-19 NOTE — Telephone Encounter (Signed)
Follow up   Patient c/o Palpitations:  High priority if patient c/o lightheadedness, shortness of breath, or chest pain  1) How long have you had palpitations/irregular HR/ Afib? Are you having the symptoms now? YES  2) Are you currently experiencing lightheadedness, SOB or CP? Lightheaded, a "little" shortness of breath  3) Do you have a history of afib (atrial fibrillation) or irregular heart rhythm? YES  4) Have you checked your BP or HR? (document readings if available): 144/103, HR 123  5) Are you experiencing any other symptoms? NO

## 2017-10-19 NOTE — Telephone Encounter (Signed)
Spoke with patient who states she is not feeling well this morning and reports HR 123 bpm and BP 144/103 mmHg. She states she has not taken her medication yet. She states she has not been up moving very much and so she does not feel light-headed or dizzy at present. I advised that Dr. Curt Bears is not in the office today and scheduled her to see Roderic Palau, NP in a fib clinic at 1:30 pm today which is earliest available appointment. I advised her to take her medication and rest this morning and to call back if her HR or BP worsen with medication and/or if she develops worsening symptoms. She verbalized understanding and agreement with plan and thanked me for the call.

## 2017-10-19 NOTE — Patient Instructions (Signed)
Cardioversion scheduled for Thursday, January 31st  - Arrive at the [facility] and go to admitting at 9:30AM  -Do not eat or drink anything after midnight the night prior to your procedure.  - Take all your medication with a sip of water prior to arrival.  - You will not be able to drive home after your procedure.

## 2017-10-19 NOTE — Progress Notes (Signed)
Patient ID: Chelsea Hancock, female   DOB: [birth year], 84 y.o.   MRN: [id number]     Primary Care Physician: Lajean Manes, MD Referring Physician: Dr. Yehuda Savannah SUETTA HOFFMEISTER is a 84 y.o. female with a h/o afib/flutter that had afib/flutter ablation with Dr. Curt Bears 11/18. She was also started on amiodarone at that time.She continued to have PAF and had second ablation 08/15/16.She called the office this am because she was found to have an elevated heart rate around 100 bpm and BP was elevated. Her BP improved after meds this am but HR stayed elevated. EKG shows possible ectopic atrial tachycardia at 104 bpm.Denies any groin issues or dysphagia.  Continues with blood thinner. The daughter states that she believes pt got anxious because  the daughter was to return to her home in [place] this am.  F/u in [facility] clinic 11/29, she has returned to [place]. She took one extra BB dose and returned to rhythm. She is still a little weak from the procedure but no dysphagia or groin issues. She is here with daughter and explains her mother is very anxious especially at night. The pt does live alone and the daughter plans to go back to [place] soon.  F/u in afib clinic 09/12/16, she is feeling stronger and has not had any afib that she is aware of.   F/u in fib clinic for return of afib. She feels that she has been out of rhythm since last Friday PM. No obvious triggers. Bisoprolol was increased to a full tablet bid by Richardson Dopp when she called to office for advise yesterday. She continues on flecainide 100 mg bid.  Today, she denies symptoms of palpitations, chest pain, shortness of breath, orthopnea, PND, lower extremity edema, dizziness, presyncope, syncope, or neurologic sequela. The patient is tolerating medications without difficulties and is otherwise without complaint today.   Past Medical History:  Diagnosis Date  . Atrial fibrillation and flutter ([place])    a. s/p PVI ablation in 07/2015 with  continue PAF  b. s/p repeat ablation on 08/15/16. continued on Flecainide 100mg  BID  . Carotid bruit    Right  . Chronic back pain   . Depression with anxiety 03/29/2013  . Diverticulosis   . Fibromyalgia   . Foot drop, right   . GERD (gastroesophageal reflux disease)   . Hemorrhoids   . HTN (hypertension)   . Hyperlipidemia   . IBS (irritable bowel syndrome)   . Memory loss 02/11/2015  . Onychomycosis 07/03/2013  . Osteoporosis   . Sigmoid colon ulcer 03/23/2012   Past Surgical History:  Procedure Laterality Date  . ABDOMINAL HYSTERECTOMY  1981  . BACK SURGERY  1997   reconstructive lower back surgery  . CARDIOVERSION N/A 07/01/2016   Procedure: CARDIOVERSION;  Surgeon: Thayer Headings, MD;  Location: [facility];  Service: Cardiovascular;  Laterality: N/A;  . CARPAL TUNNEL RELEASE Left 2012   ulnar nerve release at elbow  . CERVICAL FUSION  2003, 2005, 2013  . COLONOSCOPY    . ELECTROPHYSIOLOGIC STUDY N/A 08/10/2015   Procedure: Afib;  Surgeon: Will Meredith Leeds, MD;  Location: [place] [facility];  Service: Cardiovascular;  Laterality: N/A;  . ELECTROPHYSIOLOGIC STUDY N/A 08/15/2016   Procedure: Atrial Fibrillation Ablation;  Surgeon: Will Meredith Leeds, MD;  Location: [facility];  Service: Cardiovascular;  Laterality: N/A;  . EYE SURGERY  2015   b/l cataracts removed  . left elbow surgery  06/03/11   cyst removed  .  LUMBAR LAMINECTOMY  1960   x 2  . POSTERIOR CERVICAL FUSION/FORAMINOTOMY  08/03/2012   Procedure: POSTERIOR CERVICAL FUSION/FORAMINOTOMY LEVEL 5;  Surgeon: [name], MD;  Location: [facility];  Service: Neurosurgery;  Laterality: N/A;  Cervical four to Thoracic two Posterior cervical decompression with Facet and Pedicle screw fixation  . TONSILLECTOMY  1943  . TOTAL HIP ARTHROPLASTY Left 2012  . TOTAL KNEE ARTHROPLASTY Right 11/20/2014   Procedure: RIGHT TOTAL KNEE ARTHROPLASTY;  Surgeon: [name], MD;  [facility];  Service:  Orthopedics;  Laterality: Right;  . veins stripped  1970  . WRIST SURGERY Right 1990's   cyst removed    Current Outpatient Medications  Medication Sig Dispense Refill  . atorvastatin (LIPITOR) 10 MG tablet Take 10 mg by mouth daily.    . Biotin 5000 MCG TABS Take 5,000 mcg by mouth daily.    . bisoprolol (ZEBETA) 5 MG tablet TAKE 1 TABLET BY MOUTH EVERY MORNING AND 1/2 TABLET EVERY EVENING 135 tablet 1  . Calcium Carb-Cholecalciferol 600-200 MG-UNIT TABS Take 1 tablet by mouth daily.    Marland Kitchen ELIQUIS 5 MG TABS tablet TAKE 1 TABLET(5 MG) BY MOUTH TWICE DAILY 60 tablet 2  . esomeprazole (NEXIUM) 40 MG capsule Take 1 capsule (40 mg total) by mouth daily at 12 noon. 90 capsule 3  . eszopiclone (LUNESTA) 2 MG TABS tablet Take 1 tablet (2 mg total) by mouth at bedtime as needed for sleep. Take immediately before bedtime 30 tablet 2  . flecainide (TAMBOCOR) 100 MG tablet TAKE 1 TABLET BY MOUTH TWICE DAILY 180 tablet 1  . gabapentin (NEURONTIN) 600 MG tablet TAKE 1 TABLET(600 MG) BY MOUTH TWICE DAILY 180 tablet 0  . MEGARED OMEGA-3 KRILL OIL 500 MG CAPS Take 1 tablet by mouth daily.    . Melatonin 5 MG CAPS Take 5 mg by mouth at bedtime.     . Multiple Vitamins-Minerals (CENTRUM SILVER ULTRA WOMENS) TABS Take 1 tablet by mouth daily.    . nortriptyline (PAMELOR) 50 MG capsule Take 100 mg by mouth at bedtime.    Marland Kitchen ZETIA 10 MG tablet TAKE 1 TABLET BY MOUTH EVERY DAY 30 tablet 6  . Zoledronic Acid (RECLAST IV) Inject into the vein. yearly     No current facility-administered medications for this encounter.     Allergies  Allergen Reactions  . Cefuroxime Axetil Anaphylaxis  . Macrodantin Anaphylaxis  . Alendronate Sodium Other (See Comments)    Severe chest pain similar to heart attack   . Codeine Nausea And Vomiting  . Demerol [Meperidine] Other (See Comments)    hallucinations  . Meperidine Hcl Nausea And Vomiting  . Other Other (See Comments)    Hismanal and Maprobamate  portobello  mushrooms - diarrhea  . Sulfonamide Derivatives Hives, Itching and Swelling    Social History   Socioeconomic History  . Marital status: Divorced    Spouse name: Not on file  . Number of children: Not on file  . Years of education: Not on file  . Highest education level: Not on file  Social Needs  . Financial resource strain: Not on file  . Food insecurity - worry: Not on file  . Food insecurity - inability: Not on file  . Transportation needs - medical: Not on file  . Transportation needs - non-medical: Not on file  Occupational History  . Not on file  Tobacco Use  . Smoking status: Former Smoker    Packs/day: 0.25  Years: 4.00    Pack years: 1.00    Types: Cigarettes  . Smokeless tobacco: Never Used  . Tobacco comment: quit 20+yrs ago  Substance and Sexual Activity  . Alcohol use: Yes    Alcohol/week: 0.0 oz    Comment: WINE WITH DINNER. 2 GLASSES RED WINE  . Drug use: No  . Sexual activity: Yes    Partners: Male  Other Topics Concern  . Not on file  Social History Narrative  . Not on file    Family History  Problem Relation Age of Onset  . Bipolar disorder Mother   . Stomach cancer Mother   . Mental illness Mother   . Anxiety disorder Mother   . Stroke Father   . Colon cancer Father   . HIV Son        Aids  . Stomach cancer Maternal Grandfather   . Stomach cancer Paternal Grandmother   . Heart disease Maternal Grandmother   . Throat cancer Maternal Uncle        larynx  . Anesthesia problems Neg Hx   . Diabetes Neg Hx     ROS- All systems are reviewed and negative except as per the HPI above  Physical Exam: Vitals:   10/19/17 1325  BP: 114/82  Pulse: (!) 120  Weight: 140 lb (63.5 kg)  Height: 5\' 3"  (1.6 m)    GEN- The patient is well appearing, alert and oriented x 3 today.   Head- normocephalic, atraumatic Eyes-  Sclera clear, conjunctiva pink Ears- hearing intact Oropharynx- clear Neck- supple, no JVP Lymph- no cervical  lymphadenopathy Lungs- Clear to ausculation bilaterally, normal work of breathing Heart- irregular rate and rhythm, no murmurs, rubs or gallops, PMI not laterally displaced GI- soft, NT, ND, + BS Extremities- no clubbing, cyanosis, or edema MS- no significant deformity or atrophy Skin- no rash or lesion Psych- euthymic mood, full affect Neuro- strength and sensation are intact  EKG- atrial tachycardia vrs atypical atrial flutter at 120 bpm Epic records reviewed  Assessment and Plan: 1. A tach vrs atypical atrial  flutter with rvr S/p ablation x 2 Continue flecainide 100 mg bid Continue bisoprolol at 5 mg bid Out of rhythm since Friday Continue apixaban 5 mg bid, for chadsvasc score of at least 4, states no missed doses   Will schedule for cardioversion 1/31   Butch Penny C. Tariyah Pendry, [facility] [address] [place], [place] [employer] [address] [phone]

## 2017-10-22 ENCOUNTER — Other Ambulatory Visit: Payer: Self-pay

## 2017-10-22 ENCOUNTER — Ambulatory Visit (HOSPITAL_COMMUNITY)
Admission: RE | Admit: 2017-10-22 | Discharge: 2017-10-22 | Disposition: A | Discharge status: Home / Self Care | Payer: Medicare Other | Source: Ambulatory Visit | Attending: Internal Medicine | Admitting: Internal Medicine

## 2017-10-22 ENCOUNTER — Encounter (HOSPITAL_COMMUNITY): Payer: Self-pay

## 2017-10-22 ENCOUNTER — Ambulatory Visit (HOSPITAL_BASED_OUTPATIENT_CLINIC_OR_DEPARTMENT_OTHER): Payer: Medicare Other

## 2017-10-22 ENCOUNTER — Encounter (HOSPITAL_COMMUNITY)
Admission: RE | Disposition: A | Discharge status: Home / Self Care | Payer: Self-pay | Source: Ambulatory Visit | Attending: Internal Medicine

## 2017-10-22 ENCOUNTER — Ambulatory Visit (HOSPITAL_COMMUNITY): Payer: Medicare Other | Admitting: Anesthesiology

## 2017-10-22 DIAGNOSIS — I340 Nonrheumatic mitral (valve) insufficiency: Secondary | ICD-10-CM

## 2017-10-22 DIAGNOSIS — Z885 Allergy status to narcotic agent status: Secondary | ICD-10-CM | POA: Insufficient documentation

## 2017-10-22 DIAGNOSIS — I4892 Unspecified atrial flutter: Secondary | ICD-10-CM | POA: Diagnosis not present

## 2017-10-22 DIAGNOSIS — E785 Hyperlipidemia, unspecified: Secondary | ICD-10-CM | POA: Diagnosis not present

## 2017-10-22 DIAGNOSIS — I4891 Unspecified atrial fibrillation: Secondary | ICD-10-CM | POA: Diagnosis not present

## 2017-10-22 DIAGNOSIS — Z7901 Long term (current) use of anticoagulants: Secondary | ICD-10-CM | POA: Diagnosis not present

## 2017-10-22 DIAGNOSIS — I10 Essential (primary) hypertension: Secondary | ICD-10-CM | POA: Insufficient documentation

## 2017-10-22 DIAGNOSIS — I081 Rheumatic disorders of both mitral and tricuspid valves: Secondary | ICD-10-CM | POA: Insufficient documentation

## 2017-10-22 DIAGNOSIS — Z87891 Personal history of nicotine dependence: Secondary | ICD-10-CM | POA: Diagnosis not present

## 2017-10-22 DIAGNOSIS — R001 Bradycardia, unspecified: Secondary | ICD-10-CM | POA: Insufficient documentation

## 2017-10-22 DIAGNOSIS — Z880 Allergy status to penicillin: Secondary | ICD-10-CM | POA: Diagnosis not present

## 2017-10-22 DIAGNOSIS — Z882 Allergy status to sulfonamides status: Secondary | ICD-10-CM | POA: Insufficient documentation

## 2017-10-22 DIAGNOSIS — Z888 Allergy status to other drugs, medicaments and biological substances status: Secondary | ICD-10-CM | POA: Insufficient documentation

## 2017-10-22 DIAGNOSIS — Z79899 Other long term (current) drug therapy: Secondary | ICD-10-CM | POA: Insufficient documentation

## 2017-10-22 DIAGNOSIS — I484 Atypical atrial flutter: Secondary | ICD-10-CM

## 2017-10-22 HISTORY — PX: TEE WITHOUT CARDIOVERSION: SHX5443

## 2017-10-22 HISTORY — PX: CARDIOVERSION: SHX1299

## 2017-10-22 SURGERY — CARDIOVERSION
Anesthesia: General

## 2017-10-22 MED ORDER — PROPOFOL 10 MG/ML IV BOLUS
INTRAVENOUS | Status: DC | PRN
  Administered 2017-10-22: 20 mg via INTRAVENOUS

## 2017-10-22 MED ORDER — LIDOCAINE HCL (CARDIAC) 20 MG/ML IV SOLN
INTRAVENOUS | Status: DC | PRN
  Administered 2017-10-22: 60 mg via INTRATRACHEAL

## 2017-10-22 MED ORDER — PROPOFOL 500 MG/50ML IV EMUL
INTRAVENOUS | Status: DC | PRN
  Administered 2017-10-22: 75 ug/kg/min via INTRAVENOUS

## 2017-10-22 MED ORDER — PHENYLEPHRINE 40 MCG/ML (10ML) SYRINGE FOR IV PUSH (FOR BLOOD PRESSURE SUPPORT)
PREFILLED_SYRINGE | INTRAVENOUS | Status: DC | PRN
  Administered 2017-10-22: 40 ug via INTRAVENOUS
  Administered 2017-10-22: 60 ug via INTRAVENOUS
  Administered 2017-10-22: 80 ug via INTRAVENOUS
  Administered 2017-10-22: 40 ug via INTRAVENOUS
  Administered 2017-10-22: 100 ug via INTRAVENOUS
  Administered 2017-10-22: 180 ug via INTRAVENOUS

## 2017-10-22 MED ORDER — EPHEDRINE SULFATE-NACL 50-0.9 MG/10ML-% IV SOSY
PREFILLED_SYRINGE | INTRAVENOUS | Status: DC | PRN
  Administered 2017-10-22: 20 mg via INTRAVENOUS
  Administered 2017-10-22 (×2): 15 mg via INTRAVENOUS

## 2017-10-22 MED ORDER — SODIUM CHLORIDE 0.9 % IV SOLN
INTRAVENOUS | Status: DC
  Administered 2017-10-22: 500 mL via INTRAVENOUS

## 2017-10-22 MED ORDER — BISOPROLOL FUMARATE 5 MG PO TABS: 5.0000 mg | ORAL_TABLET | Freq: Every day | ORAL | 1 refills | Status: DC

## 2017-10-22 MED ORDER — GLYCOPYRROLATE 0.2 MG/ML IV SOSY
PREFILLED_SYRINGE | INTRAVENOUS | Status: DC | PRN
  Administered 2017-10-22: .2 mg via INTRAVENOUS

## 2017-10-22 NOTE — Discharge Instructions (Signed)
Electrical Cardioversion, Care After °This sheet gives you information about how to care for yourself after your procedure. Your health care provider may also give you more specific instructions. If you have problems or questions, contact your health care provider. °What can I expect after the procedure? °After the procedure, it is common to have: °· Some redness on the skin where the shocks were given. ° °Follow these instructions at home: °· Do not drive for 24 hours if you were given a medicine to help you relax (sedative). °· Take over-the-counter and prescription medicines only as told by your health care provider. °· Ask your health care provider how to check your pulse. Check it often. °· Rest for 48 hours after the procedure or as told by your health care provider. °· Avoid or limit your caffeine use as told by your health care provider. °Contact a health care provider if: °· You feel like your heart is beating too quickly or your pulse is not regular. °· You have a serious muscle cramp that does not go away. °Get help right away if: °· You have discomfort in your chest. °· You are dizzy or you feel faint. °· You have trouble breathing or you are short of breath. °· Your speech is slurred. °· You have trouble moving an arm or leg on one side of your body. °· Your fingers or toes turn cold or blue. °This information is not intended to replace advice given to you by your health care provider. Make sure you discuss any questions you have with your health care provider. °Document Released: 06/29/2013 Document Revised: 04/11/2016 Document Reviewed: 03/14/2016 °Elsevier Interactive Patient Education © 2018 Elsevier Inc. ° °

## 2017-10-22 NOTE — Transfer of Care (Signed)
Immediate Anesthesia Transfer of Care Note  Patient: Chelsea Hancock  Procedure(s) Performed: CARDIOVERSION (N/A )  Patient Location: Endoscopy Unit  Anesthesia Type:MAC  Level of Consciousness: awake, alert  and oriented  Airway & Oxygen Therapy: Patient Spontanous Breathing  Post-op Assessment: Report given to RN and Post -op Vital signs reviewed and stable  Post vital signs: Reviewed and stable  Last Vitals:  Vitals:   10/22/17 1008  BP: (!) 107/58  Pulse: (!) 118  Resp: 15  Temp: 36.9 C  SpO2: 99%    Last Pain:  Vitals:   10/22/17 1008  TempSrc: Oral         Complications: No apparent anesthesia complications

## 2017-10-22 NOTE — CV Procedure (Signed)
TEE/CARDIOVERSION NOTE  TRANSESOPHAGEAL ECHOCARDIOGRAM (TEE):  Indictation: Atrial Flutter  Consent:   The patient reported missing 2 prior doses of her anticoagulation yesterday, therefore, I have recommended a TEE-guided approach.  Informed consent was obtained prior to the procedure. The risks, benefits and alternatives for the procedure were discussed and the patient comprehended these risks.  Risks include, but are not limited to, cough, sore throat, vomiting, nausea, somnolence, esophageal and stomach trauma or perforation, bleeding, low blood pressure, aspiration, pneumonia, infection, trauma to the teeth and death.    Time Out: Verified patient identification, verified procedure, site/side was marked, verified correct patient position, special equipment/implants available, medications/allergies/relevent history reviewed, required imaging and test results available. Performed  Procedure:  After a procedural time-out, the patient was given propofol per anesthesia for sedation. The patient's heart rate, blood pressure, and oxygen saturation are monitored continuously during the procedure. The oropharynx was anesthetized topical cetacaine.  The transesophageal probe was inserted in the esophagus and stomach without difficulty and multiple views were obtained. Agitated microbubble saline contrast was administered.  Complications:    Complications: None Patient did tolerate procedure well.  Findings:  1. LEFT VENTRICLE: The left ventricular wall thickness is mildly increased.  The left ventricular cavity is normal in size. Wall motion is mildly hypokinetic.  LVEF is 45-50%.  2. RIGHT VENTRICLE:  The right ventricle is normal in structure and function without any thrombus or masses.    3. LEFT ATRIUM:  The left atrium is moderately dilated in size without any thrombus or masses.  There is not spontaneous echo contrast ("smoke") in the left atrium consistent with a low flow  state.  4. LEFT ATRIAL APPENDAGE:  The left atrial appendage is free of any thrombus or masses. The appendage has single lobes. Pulse doppler indicates low flow in the appendage.  5. ATRIAL SEPTUM:  The atrial septum demonstrated with brisk flow across the septum consistent with a sinus venosus ASD. Saline microbubble contrast was noted to briskly move from right to left and left to right with negative contrast. The size of the defect measures 1.3-1.5 cm in multiple views and was well-visualized with 2D and 3D ultrasound.  6. RIGHT ATRIUM:  The right atrium is mildly dilated in size and function without any thrombus or masses.  7. MITRAL VALVE:  The mitral valve is normal in structure and function with Mild regurgitation.  There were no vegetations or stenosis.  8. AORTIC VALVE:  The aortic valve is trileaflet, normal in structure and function with no significant regurgitation.  There were no vegetations or stenosis  9. TRICUSPID VALVE:  The tricuspid valve is normal in structure and function with Mild regurgitation.  There were no vegetations or stenosis  10.  PULMONIC VALVE:  The pulmonic valve is normal in structure and function with trivial regurgitation.  There were no vegetations or stenosis.   11. AORTIC ARCH, ASCENDING AND DESCENDING AORTA:  There was grade 1 Ron Parker et. Al, 1992) atherosclerosis of the ascending aorta, aortic arch, or proximal descending aorta.  12. PULMONARY VEINS: Anomalous pulmonary venous return was not noted.  13. PERICARDIUM: The pericardium appeared normal and non-thickened.  There is no pericardial effusion.  CARDIOVERSION:     Second Time Out: Verified patient identification, verified procedure, site/side was marked, verified correct patient position, special equipment/implants available, medications/allergies/relevent history reviewed, required imaging and test results available.  Performed  Procedure:  1. Patient placed on cardiac monitor, pulse  oximetry, supplemental oxygen as necessary.  2.  Sedation administered per anesthesia 3. Pacer pads placed anterior and posterior chest. 4. Cardioverted 1 time(s).  5. Cardioverted at 120J biphasic.  Complications:  Complications: None Patient did tolerate procedure well.  Impression:  1. No LAA thrombus 2. Incidental finding of what appears to be a sinus venosus ASD - measuring 1.3-1.5 cm with bidirectional flow seen by color doppler and with saline microbubble contrast. 3. LVEF 45-50% with global hypokinesis 4. Successful DCCV to sinus bradycardia with a single 120J biphasic shock.  Recommendations:  1. Should follow up with Dr. Curt Bears. I reviewed the TEE images with Dr. Sallyanne Kuster and Dr. Stanford Breed who both agree the findings suggest sinus venosus ASD - surprisingly, this was not noted in her 2 prior cardiac CT scans. The defect is too large and not in the expected position of her aforementioned transseptal puncture associated with a prior ablation.  It may be worthwhile reviewing them on a workstation or considering a repeat imaging study and CT surgery consultation - this is likely too large for device closure.  Time Spent Directly with the Patient:  75 minutes   Pixie Casino, MD, [facility], [place] Director of the [facility] Lipid [facility]  Card[facility]ovascular [facility] Diplomate of the American Board of Clinical Lipidology Attending Cardiologist  Direct Dial: [phone]  Fax: [phone]  Website:  [email or web address] Varelas 10/22/2017, 12:45 PM

## 2017-10-22 NOTE — Progress Notes (Signed)
  Echocardiogram Echocardiogram Transesophageal has been performed.  Chelsea Hancock 10/22/2017, 12:19 PM

## 2017-10-22 NOTE — Anesthesia Preprocedure Evaluation (Addendum)
Anesthesia Evaluation  Patient identified by MRN, date of birth, ID band Patient awake    Reviewed: Allergy & Precautions, H&P , Patient's Chart, lab work & pertinent test results, reviewed documented beta blocker date and time   Airway Mallampati: II  TM Distance: >3 FB Neck ROM: full    Dental no notable dental hx.    Pulmonary former smoker,    Pulmonary exam normal breath sounds clear to auscultation       Cardiovascular hypertension,  Rhythm:regular Rate:Normal     Neuro/Psych    GI/Hepatic   Endo/Other    Renal/GU      Musculoskeletal   Abdominal   Peds  Hematology   Anesthesia Other Findings 4-17 Echo  Normal LV systolic function; grade 2 diastolic dysfunction wth   elevated LV filling pressure; mild LAE; trace MR; mild TR.  Reproductive/Obstetrics                            Anesthesia Physical Anesthesia Plan  ASA: II  Anesthesia Plan: General   Post-op Pain Management:    Induction: Intravenous  PONV Risk Score and Plan: Treatment may vary due to age or medical condition and Ondansetron  Airway Management Planned: Mask and Natural Airway  Additional Equipment:   Intra-op Plan:   Post-operative Plan: Extubation in OR  Informed Consent: I have reviewed the patients History and Physical, chart, labs and discussed the procedure including the risks, benefits and alternatives for the proposed anesthesia with the patient or authorized representative who has indicated his/her understanding and acceptance.   Dental Advisory Given  Plan Discussed with: CRNA and Surgeon  Anesthesia Plan Comments: (  )        Anesthesia Quick Evaluation

## 2017-10-22 NOTE — Anesthesia Procedure Notes (Signed)
Procedure Name: MAC Date/Time: 10/22/2017 11:36 AM Performed by: Teressa Lower., CRNA Pre-anesthesia Checklist: Patient identified, Emergency Drugs available, Suction available, Patient being monitored and Timeout performed Patient Re-evaluated:Patient Re-evaluated prior to induction Oxygen Delivery Method: Nasal cannula

## 2017-10-23 ENCOUNTER — Encounter (HOSPITAL_COMMUNITY): Payer: Self-pay | Admitting: Internal Medicine

## 2017-10-23 NOTE — Anesthesia Postprocedure Evaluation (Signed)
Anesthesia Post Note  Patient: Chelsea Hancock  Procedure(s) Performed: CARDIOVERSION (N/A ) TRANSESOPHAGEAL ECHOCARDIOGRAM (TEE) (N/A )     Patient location during evaluation: PACU Anesthesia Type: General Level of consciousness: awake and alert Pain management: pain level controlled Vital Signs Assessment: post-procedure vital signs reviewed and stable Respiratory status: spontaneous breathing, nonlabored ventilation, respiratory function stable and patient connected to nasal cannula oxygen Cardiovascular status: stable and blood pressure returned to baseline Postop Assessment: no apparent nausea or vomiting Anesthetic complications: no    Last Vitals:  Vitals:   10/22/17 1310 10/22/17 1315  BP: (!) 104/55 (!) 127/56  Pulse: 64 (!) 59  Resp: (!) 22 13  Temp:    SpO2: 98% 97%    Last Pain:  Vitals:   10/22/17 1225  TempSrc: Oral                 Eudell Julian EDWARD

## 2017-10-28 ENCOUNTER — Telehealth: Payer: Self-pay | Admitting: *Deleted

## 2017-10-28 NOTE — Telephone Encounter (Signed)
Called patient to discuss sinus venosus ASD findings on recent TEE. Discussed/reviewed/educated about what this is. Informed patient that there is no concern at this time and we will monitor. She understands she will talk more about this with Dr. Baird Kay at appt on 2/18. She appreciates the call/follow up about this matter.

## 2017-10-29 ENCOUNTER — Ambulatory Visit (HOSPITAL_COMMUNITY): Payer: Medicare Other | Admitting: Nurse Practitioner

## 2017-10-29 DIAGNOSIS — M797 Fibromyalgia: Secondary | ICD-10-CM | POA: Diagnosis not present

## 2017-10-29 DIAGNOSIS — M810 Age-related osteoporosis without current pathological fracture: Secondary | ICD-10-CM | POA: Diagnosis not present

## 2017-10-29 DIAGNOSIS — E559 Vitamin D deficiency, unspecified: Secondary | ICD-10-CM | POA: Diagnosis not present

## 2017-10-29 DIAGNOSIS — M179 Osteoarthritis of knee, unspecified: Secondary | ICD-10-CM | POA: Diagnosis not present

## 2017-11-03 ENCOUNTER — Other Ambulatory Visit: Payer: Self-pay | Admitting: Cardiology

## 2017-11-03 ENCOUNTER — Telehealth: Payer: Self-pay | Admitting: Cardiology

## 2017-11-03 NOTE — Telephone Encounter (Signed)
Pt states she is now back in NSR as of a couple of hours ago. Reports being out of rhythm for about 8-10 hours. She will continue to monitor and call the office if she remains in AFib for greater than a few hours and/or symptomatic.  Otherwise, we will discuss further at [place] Monday, 2/18

## 2017-11-03 NOTE — Telephone Encounter (Signed)
Patient c/o Palpitations:  High priority if patient c/o lightheadedness, shortness of breath, or chest pain  1) How long have you had palpitations/irregular HR/ Afib? Are you having the symptoms now? started last night /  yes  2) Are you currently experiencing lightheadedness, SOB or CP? no   3) Do you have a history of afib (atrial fibrillation) or irregular heart rhythm? yes   4) Have you checked your BP or HR? (document readings if available): no  5) Are you experiencing any other symptoms? no

## 2017-11-08 NOTE — Progress Notes (Signed)
Electrophysiology Office Note   Date:  11/09/2017   ID:  Chelsea Hancock, DOB 04-28-1934, MRN [id number]  PCP:  Lajean Manes, MD  Cardiologist:  [name] Primary Electrophysiologist:  Constance Haw, MD    Chief Complaint  Patient presents with  . Follow-up    Persistent AFib/AFlutter/post cardioversion     History of Present Illness: Chelsea Hancock is a 84 y.o. female who presents today for electrophysiology evaluation.   She had an ablation for atrial fib/flutter on 07/2015. Repeat ablation scheduled for 08/15/16. Since that time, she is felt well without major complaint. She did yard work last week without any major problems.  She reverted back to atrial fibrillation and had a repeat cardioversion on 10/22/17.  TEE showed an ASD.  Review of cardiac CT done prior to ablation showed no evidence of ASD.  Today, denies symptoms of chest pain, shortness of breath, orthopnea, PND, lower extremity edema, claudication, dizziness, presyncope, syncope, bleeding, or neurologic sequela. The patient is tolerating medications without difficulties.  She is continued to have episodic palpitations since her cardioversion.  He was having palpitations last weekend as well.  Of note, she has had 2 close friends die in the last month, 1 of which was last night.  There is a question of whether or not she is missing doses of her medications.  Past Medical History:  Diagnosis Date  . Atrial fibrillation and flutter ([place])    a. s/p PVI ablation in 07/2015 with continue PAF  b. s/p repeat ablation on 08/15/16. continued on Flecainide 100mg  BID  . Carotid bruit    Right  . Chronic back pain   . Depression with anxiety 03/29/2013  . Diverticulosis   . Fibromyalgia   . Foot drop, right   . GERD (gastroesophageal reflux disease)   . Hemorrhoids   . HTN (hypertension)   . Hyperlipidemia   . IBS (irritable bowel syndrome)   . Memory loss 02/11/2015  . Onychomycosis 07/03/2013  .  Osteoporosis   . Sigmoid colon ulcer 03/23/2012   Past Surgical History:  Procedure Laterality Date  . ABDOMINAL HYSTERECTOMY  1981  . BACK SURGERY  1997   reconstructive lower back surgery  . CARDIOVERSION N/A 07/01/2016   Procedure: CARDIOVERSION;  Surgeon: Thayer Headings, MD;  Location: [facility];  Service: Cardiovascular;  Laterality: N/A;  . CARDIOVERSION N/A 10/22/2017   Procedure: CARDIOVERSION;  Surgeon: Pixie Casino, MD;  Location: [facility] ENDOSCOPY;  Service: Cardiovascular;  Laterality: N/A;  . CARPAL TUNNEL RELEASE Left 2012   ulnar nerve release at elbow  . CERVICAL FUSION  2003, 2005, 2013  . COLONOSCOPY    . ELECTROPHYSIOLOGIC STUDY N/A 08/10/2015   Procedure: Afib;  Surgeon: Morine Kohlman Meredith Leeds, MD;  Location: [facility] CV LAB;  Service: Cardiovascular;  Laterality: N/A;  . ELECTROPHYSIOLOGIC STUDY N/A 08/15/2016   Procedure: Atrial Fibrillation Ablation;  Surgeon: Shaakira Borrero Meredith Leeds, MD;  Location: [facility] CV LAB;  Service: Cardiovascular;  Laterality: N/A;  . EYE SURGERY  2015   b/l cataracts removed  . left elbow surgery  06/03/11   cyst removed  . LUMBAR LAMINECTOMY  1960   x 2  . POSTERIOR CERVICAL FUSION/FORAMINOTOMY  08/03/2012   Procedure: POSTERIOR CERVICAL FUSION/FORAMINOTOMY LEVEL 5;  Surgeon: [name], MD;  Location: [facility] NEURO ORS;  Service: Neurosurgery;  Laterality: N/A;  Cervical four to Thoracic two Posterior cervical decompression with Facet and Pedicle screw fixation  . TEE WITHOUT CARDIOVERSION N/A 10/22/2017  Procedure: TRANSESOPHAGEAL ECHOCARDIOGRAM (TEE);  Surgeon: Pixie Casino, MD;  Location: [facility] ENDOSCOPY;  Service: Cardiovascular;  Laterality: N/A;  . TONSILLECTOMY  1943  . TOTAL HIP ARTHROPLASTY Left 2012  . TOTAL KNEE ARTHROPLASTY Right 11/20/2014   Procedure: RIGHT TOTAL KNEE ARTHROPLASTY;  Surgeon: [name], MD;  Location: [facility] ORS;  Service: Orthopedics;  Laterality: Right;  . veins stripped  1970  . WRIST SURGERY Right  1990's   cyst removed     Current Outpatient Medications  Medication Sig Dispense Refill  . atorvastatin (LIPITOR) 10 MG tablet Take 10 mg by mouth daily.    . Biotin 5000 MCG TABS Take 5,000 mcg by mouth daily.    . bisoprolol (ZEBETA) 5 MG tablet Take 1 tablet (5 mg total) by mouth daily. 135 tablet 1  . Calcium Carb-Cholecalciferol 600-200 MG-UNIT TABS Take 1 tablet by mouth daily.    Marland Kitchen ELIQUIS 5 MG TABS tablet TAKE 1 TABLET(5 MG) BY MOUTH TWICE DAILY 60 tablet 2  . esomeprazole (NEXIUM) 40 MG capsule TAKE ONE CAPSULE BY MOUTH EVERY DAY AT 12 NOON 90 capsule 0  . eszopiclone (LUNESTA) 2 MG TABS tablet Take 1 tablet (2 mg total) by mouth at bedtime as needed for sleep. Take immediately before bedtime (Patient taking differently: Take 2 mg by mouth at bedtime. Take immediately before bedtime) 30 tablet 2  . flecainide (TAMBOCOR) 100 MG tablet TAKE 1 TABLET BY MOUTH TWICE DAILY (Patient taking differently: TAKE 100 MG BY MOUTH TWICE DAILY) 180 tablet 1  . gabapentin (NEURONTIN) 600 MG tablet TAKE 1 TABLET(600 MG) BY MOUTH TWICE DAILY 180 tablet 0  . MEGARED OMEGA-3 KRILL OIL 500 MG CAPS Take 500 mg by mouth daily.     . Melatonin 10 MG CAPS Take 10 mg by mouth at bedtime.    . Multiple Vitamins-Minerals (CENTRUM SILVER ULTRA WOMENS) TABS Take 1 tablet by mouth daily.    . nortriptyline (PAMELOR) 25 MG capsule Take 50 mg by mouth at bedtime.     Chelsea Hancock Glycol-Propyl Glycol (SYSTANE OP) Place 1 drop into both eyes daily as needed (for dry eyes).    Marland Kitchen ZETIA 10 MG tablet TAKE 1 TABLET BY MOUTH EVERY DAY (Patient taking differently: TAKE 10 MG BY MOUTH AT BEDTIME) 30 tablet 6  . Zoledronic Acid (RECLAST IV) Inject into the vein. yearly     No current facility-administered medications for this visit.     Allergies:   Cefuroxime axetil; Macrodantin; Alendronate sodium; Codeine; Demerol [meperidine]; Meperidine hcl; Other; Penicillins; and Sulfonamide derivatives   Social History:  The  patient  reports that she has quit smoking. Her smoking use included cigarettes. She has a 1.00 pack-year smoking history. she has never used smokeless tobacco. She reports that she drinks alcohol. She reports that she does not use drugs.   Family History:  The patient's family history includes Anxiety disorder in her mother; Bipolar disorder in her mother; Colon cancer in her father; HIV in her son; Heart disease in her maternal grandmother; Mental illness in her mother; Stomach cancer in her maternal grandfather, mother, and paternal grandmother; Stroke in her father; Throat cancer in her maternal uncle.   ROS:  Please see the history of present illness.   Otherwise, review of systems is positive for none.   All other systems are reviewed and negative.   PHYSICAL EXAM: VS:  BP 134/72   Pulse 66   Ht 5' 2.5" (1.588 m)   Wt 139 lb (63  kg)   BMI 25.02 kg/m  , BMI Body mass index is 25.02 kg/m. GEN: Well nourished, well developed, in no acute distress  HEENT: normal  Neck: no JVD, carotid bruits, or masses Cardiac: RRR; no murmurs, rubs, or gallops,no edema  Respiratory:  clear to auscultation bilaterally, normal work of breathing GI: soft, nontender, nondistended, + BS MS: no deformity or atrophy  Skin: warm and dry Neuro:  Strength and sensation are intact Psych: euthymic mood, full affect  EKG:  EKG is ordered today. Personal review of the ekg ordered shows sinus rhythm, 1dAVB, voltage for LVH, rate 66   Recent Labs: 10/19/2017: BUN 20; Creatinine, Ser 0.66; Hemoglobin 14.0; Platelets 251; Potassium 4.0; Sodium 140; TSH 3.796    Lipid Panel     Component Value Date/Time   CHOL 187 08/03/2014 1053   TRIG 150.0 (H) 08/03/2014 1053   TRIG 133 06/30/2006 1110   HDL 45.80 08/03/2014 1053   CHOLHDL 4 08/03/2014 1053   VLDL 30.0 08/03/2014 1053   LDLCALC 111 (H) 08/03/2014 1053   LDLDIRECT 93.3 08/04/2008 0915     Wt Readings from Last 3 Encounters:  11/09/17 139 lb (63 kg)    10/19/17 140 lb (63.5 kg)  11/18/16 148 lb (67.1 kg)      Other studies Reviewed: Additional studies/ records that were reviewed today include: TTE 2015  Review of the above records today demonstrates: - Left ventricle: The cavity size was normal. There was mild concentric hypertrophy. Systolic function was normal. The estimated ejection fraction was in the range of 55% to 60%. Wall motion was normal; there were no regional wall motion abnormalities. The study is not technically sufficient to allow evaluation of LV diastolic function. - Atrial septum: No defect or patent foramen ovale was identified.   ASSESSMENT AND PLAN:  1.  Atrial fibrillation: Has had 2 separate ablations for atrial fibrillation.  Most recently on 08/15/16.  She was put on flecainide after the second ablation.  She has had more episodes of atrial fibrillation since that time.  There is question on if she is missing doses of flecainide.  Due to that, we Edder Bellanca switch her to amiodarone.    This patients CHA2DS2-VASc Score and unadjusted Ischemic Stroke Rate (% per year) is equal to 3.2 % stroke rate/year from a score of 3  Above score calculated as 1 point each if present [CHF, HTN, DM, Vascular=MI/PAD/Aortic Plaque, Age if 65-74, or Female] Above score calculated as 2 points each if present [Age > 75, or Stroke/TIA/TE]  2. Hypertension: Well-controlled today.  No changes.  3. Hyperlipidemia: Continue atorvastatin and Zetia.  Current medicines are reviewed at length with the patient today.   The patient does not have concerns regarding her medicines.  The following changes were made today: Stop flecainide, start amiodarone  Labs/ tests ordered today include:  Orders Placed This Encounter  Procedures  . EKG 12-Lead     Disposition:   FU with Ceaser Ebeling 3  months  Signed, Taydem Cavagnaro Meredith Leeds, MD  11/09/2017 10:56 AM     [facility] [address] [place]  [phone] (office) [phone] (fax)

## 2017-11-09 ENCOUNTER — Telehealth: Payer: Self-pay | Admitting: Cardiology

## 2017-11-09 ENCOUNTER — Encounter: Payer: Self-pay | Admitting: Cardiology

## 2017-11-09 ENCOUNTER — Ambulatory Visit (INDEPENDENT_AMBULATORY_CARE_PROVIDER_SITE_OTHER): Payer: Medicare Other | Admitting: Cardiology

## 2017-11-09 VITALS — BP 134/72 | HR 66 | Ht 62.5 in | Wt 139.0 lb

## 2017-11-09 DIAGNOSIS — E785 Hyperlipidemia, unspecified: Secondary | ICD-10-CM

## 2017-11-09 DIAGNOSIS — I10 Essential (primary) hypertension: Secondary | ICD-10-CM | POA: Diagnosis not present

## 2017-11-09 DIAGNOSIS — I4819 Other persistent atrial fibrillation: Secondary | ICD-10-CM

## 2017-11-09 DIAGNOSIS — I481 Persistent atrial fibrillation: Secondary | ICD-10-CM | POA: Diagnosis not present

## 2017-11-09 MED ORDER — AMIODARONE HCL 200 MG PO TABS: 200.0000 mg | ORAL_TABLET | Freq: Every day | ORAL | 2 refills | Status: DC

## 2017-11-09 MED ORDER — AMIODARONE HCL 200 MG PO TABS: ORAL_TABLET | ORAL | 0 refills | Status: DC

## 2017-11-09 NOTE — Telephone Encounter (Signed)
Resent loading dose Rx, as dtr states that pharmacy only stated they received the maintenance dose. Spoke with pharmacist at [facility]. Dtr understands Rx has been resent.

## 2017-11-09 NOTE — Addendum Note (Signed)
Addended by: Stanton Kidney on: 11/09/2017 02:30 PM   Modules accepted: Orders

## 2017-11-09 NOTE — Addendum Note (Signed)
Addended by: Stanton Kidney on: 11/09/2017 11:49 AM   Modules accepted: Orders

## 2017-11-09 NOTE — Patient Instructions (Addendum)
Medication Instructions: 1) STOP Flecainide today  2) Start Amiodarone on  11/11/17 - take only the pm does on this day.  - Take 2 tablets (400 mg total) twice daily for two weeks, then  - take 1 tablets (200 mg total) twice daily for two weeks, then    - then 1 tablet (200 mg) once daily.  Labwork: None ordered  Procedures/Testing: None ordered  Follow-Up: Your physician recommends that you schedule a follow-up appointment in: 3 months with Dr. Curt Bears.  If you need a refill on your cardiac medications before your next appointment, please call your pharmacy.   Any Additional Special Instructions Will Be Listed Below (If Applicable).  Amiodarone tablets What is this medicine? AMIODARONE (a MEE oh da rone) is an antiarrhythmic drug. It helps make your heart beat regularly. Because of the side effects caused by this medicine, it is only used when other medicines have not worked. It is usually used for heartbeat problems that may be life threatening. This medicine may be used for other purposes; ask your health care provider or pharmacist if you have questions. COMMON BRAND NAME(S): Cordarone, Pacerone What should I tell my health care provider before I take this medicine? They need to know if you have any of these conditions: -liver disease -lung disease -other heart problems -thyroid disease -an unusual or allergic reaction to amiodarone, iodine, other medicines, foods, dyes, or preservatives -pregnant or trying to get pregnant -breast-feeding How should I use this medicine? Take this medicine by mouth with a glass of water. Follow the directions on the prescription label. You can take this medicine with or without food. However, you should always take it the same way each time. Take your doses at regular intervals. Do not take your medicine more often than directed. Do not stop taking except on the advice of your doctor or health care professional. A special MedGuide will be given  to you by the pharmacist with each prescription and refill. Be sure to read this information carefully each time. Talk to your pediatrician regarding the use of this medicine in children. Special care may be needed. Overdosage: If you think you have taken too much of this medicine contact a poison control center or emergency room at once. NOTE: This medicine is only for you. Do not share this medicine with others. What if I miss a dose? If you miss a dose, take it as soon as you can. If it is almost time for your next dose, take only that dose. Do not take double or extra doses. What may interact with this medicine? Do not take this medicine with any of the following medications: -abarelix -apomorphine -arsenic trioxide -certain antibiotics like erythromycin, gemifloxacin, levofloxacin, pentamidine -certain medicines for depression like amoxapine, tricyclic antidepressants -certain medicines for fungal infections like fluconazole, itraconazole, ketoconazole, posaconazole, voriconazole -certain medicines for irregular heart beat like disopyramide, dofetilide, dronedarone, ibutilide, propafenone, sotalol -certain medicines for malaria like chloroquine, halofantrine -cisapride -droperidol -haloperidol -hawthorn -maprotiline -methadone -phenothiazines like chlorpromazine, mesoridazine, thioridazine -pimozide -ranolazine -red yeast rice -vardenafil -ziprasidone This medicine may also interact with the following medications: -antiviral medicines for HIV or AIDS -certain medicines for blood pressure, heart disease, irregular heart beat -certain medicines for cholesterol like atorvastatin, cerivastatin, lovastatin, simvastatin -certain medicines for hepatitis C like sofosbuvir and ledipasvir; sofosbuvir -certain medicines for seizures like phenytoin -certain medicines for thyroid problems -certain medicines that treat or prevent blood clots like  warfarin -cholestyramine -cimetidine -clopidogrel -cyclosporine -dextromethorphan -diuretics -fentanyl -general anesthetics -grapefruit  juice -lidocaine -loratadine -methotrexate -other medicines that prolong the QT interval (cause an abnormal heart rhythm) -procainamide -quinidine -rifabutin, rifampin, or rifapentine -St. John's Wort -trazodone This list may not describe all possible interactions. Give your health care provider a list of all the medicines, herbs, non-prescription drugs, or dietary supplements you use. Also tell them if you smoke, drink alcohol, or use illegal drugs. Some items may interact with your medicine. What should I watch for while using this medicine? Your condition will be monitored closely when you first begin therapy. Often, this drug is first started in a hospital or other monitored health care setting. Once you are on maintenance therapy, visit your doctor or health care professional for regular checks on your progress. Because your condition and use of this medicine carry some risk, it is a good idea to carry an identification card, necklace or bracelet with details of your condition, medications, and doctor or health care professional. Dennis Bast may get drowsy or dizzy. Do not drive, use machinery, or do anything that needs mental alertness until you know how this medicine affects you. Do not stand or sit up quickly, especially if you are an older patient. This reduces the risk of dizzy or fainting spells. This medicine can make you more sensitive to the sun. Keep out of the sun. If you cannot avoid being in the sun, wear protective clothing and use sunscreen. Do not use sun lamps or tanning beds/booths. You should have regular eye exams before and during treatment. Call your doctor if you have blurred vision, see halos, or your eyes become sensitive to light. Your eyes may get dry. It may be helpful to use a lubricating eye solution or artificial tears  solution. If you are going to have surgery or a procedure that requires contrast dyes, tell your doctor or health care professional that you are taking this medicine. What side effects may I notice from receiving this medicine? Side effects that you should report to your doctor or health care professional as soon as possible: -allergic reactions like skin rash, itching or hives, swelling of the face, lips, or tongue -blue-gray coloring of the skin -blurred vision, seeing blue green halos, increased sensitivity of the eyes to light -breathing problems -chest pain -dark urine -fast, irregular heartbeat -feeling faint or light-headed -intolerance to heat or cold -nausea or vomiting -pain and swelling of the scrotum -pain, tingling, numbness in feet, hands -redness, blistering, peeling or loosening of the skin, including inside the mouth -spitting up blood -stomach pain -sweating -unusual or uncontrolled movements of body -unusually weak or tired -weight gain or loss -yellowing of the eyes or skin Side effects that usually do not require medical attention (report to your doctor or health care professional if they continue or are bothersome): -change in sex drive or performance -constipation -dizziness -headache -loss of appetite -trouble sleeping This list may not describe all possible side effects. Call your doctor for medical advice about side effects. You may report side effects to FDA at 1-800-FDA-1088. Where should I keep my medicine? Keep out of the reach of children. Store at room temperature between 20 and 25 degrees C (68 and 77 degrees F). Protect from light. Keep container tightly closed. Throw away any unused medicine after the expiration date. NOTE: This sheet is a summary. It may not cover all possible information. If you have questions about this medicine, talk to your doctor, pharmacist, or health care provider.  2018 Elsevier/Gold Standard (2013-12-12 19:48:11)

## 2017-11-09 NOTE — Addendum Note (Signed)
Addended by: Sandrea Hammond D on: 11/09/2017 11:41 AM   Modules accepted: Orders

## 2017-11-09 NOTE — Telephone Encounter (Signed)
New message      Pt c/o medication issue:  1. Name of Medication: amiodarone (PACERONE) 200 MG tablet  2. How are you currently taking this medication (dosage and times per day)? Has not started yet   3. Are you having a reaction (difficulty breathing--STAT)? no  4. What is your medication issue? The instruction you gave them and the instruction that the pharmacy gave are different

## 2017-11-19 DIAGNOSIS — N3020 Other chronic cystitis without hematuria: Secondary | ICD-10-CM | POA: Diagnosis not present

## 2018-01-05 DIAGNOSIS — I480 Paroxysmal atrial fibrillation: Secondary | ICD-10-CM | POA: Diagnosis not present

## 2018-01-05 DIAGNOSIS — Z79899 Other long term (current) drug therapy: Secondary | ICD-10-CM | POA: Diagnosis not present

## 2018-01-05 DIAGNOSIS — E7800 Pure hypercholesterolemia, unspecified: Secondary | ICD-10-CM | POA: Diagnosis not present

## 2018-01-05 DIAGNOSIS — S60811A Abrasion of right wrist, initial encounter: Secondary | ICD-10-CM | POA: Diagnosis not present

## 2018-01-05 DIAGNOSIS — R29898 Other symptoms and signs involving the musculoskeletal system: Secondary | ICD-10-CM | POA: Diagnosis not present

## 2018-01-21 DIAGNOSIS — E7800 Pure hypercholesterolemia, unspecified: Secondary | ICD-10-CM | POA: Diagnosis not present

## 2018-01-21 DIAGNOSIS — M810 Age-related osteoporosis without current pathological fracture: Secondary | ICD-10-CM | POA: Diagnosis not present

## 2018-01-21 DIAGNOSIS — I480 Paroxysmal atrial fibrillation: Secondary | ICD-10-CM | POA: Diagnosis not present

## 2018-01-28 DIAGNOSIS — L439 Lichen planus, unspecified: Secondary | ICD-10-CM | POA: Diagnosis not present

## 2018-01-28 DIAGNOSIS — Z85828 Personal history of other malignant neoplasm of skin: Secondary | ICD-10-CM | POA: Diagnosis not present

## 2018-01-28 DIAGNOSIS — D225 Melanocytic nevi of trunk: Secondary | ICD-10-CM | POA: Diagnosis not present

## 2018-01-28 DIAGNOSIS — D1801 Hemangioma of skin and subcutaneous tissue: Secondary | ICD-10-CM | POA: Diagnosis not present

## 2018-01-28 DIAGNOSIS — L821 Other seborrheic keratosis: Secondary | ICD-10-CM | POA: Diagnosis not present

## 2018-01-28 DIAGNOSIS — D485 Neoplasm of uncertain behavior of skin: Secondary | ICD-10-CM | POA: Diagnosis not present

## 2018-02-05 ENCOUNTER — Other Ambulatory Visit: Payer: Self-pay | Admitting: Cardiology

## 2018-02-09 ENCOUNTER — Encounter: Payer: Self-pay | Admitting: Cardiology

## 2018-02-09 ENCOUNTER — Ambulatory Visit (INDEPENDENT_AMBULATORY_CARE_PROVIDER_SITE_OTHER): Payer: Medicare Other | Admitting: Cardiology

## 2018-02-09 VITALS — BP 152/70 | HR 51 | Ht 64.0 in | Wt 144.0 lb

## 2018-02-09 DIAGNOSIS — M810 Age-related osteoporosis without current pathological fracture: Secondary | ICD-10-CM | POA: Diagnosis not present

## 2018-02-09 DIAGNOSIS — E785 Hyperlipidemia, unspecified: Secondary | ICD-10-CM | POA: Diagnosis not present

## 2018-02-09 DIAGNOSIS — I10 Essential (primary) hypertension: Secondary | ICD-10-CM

## 2018-02-09 DIAGNOSIS — I4819 Other persistent atrial fibrillation: Secondary | ICD-10-CM

## 2018-02-09 DIAGNOSIS — I481 Persistent atrial fibrillation: Secondary | ICD-10-CM

## 2018-02-09 DIAGNOSIS — Z79899 Other long term (current) drug therapy: Secondary | ICD-10-CM | POA: Diagnosis not present

## 2018-02-09 NOTE — Patient Instructions (Signed)
Medication Instructions:  Your physician recommends that you continue on your current medications as directed. Please refer to the Current Medication list given to you today.  Labwork: None ordered  Testing/Procedures: None ordered  Follow-Up: Your physician wants you to follow-up in: 6 months with Dr. Camnitz.  You will receive a reminder letter in the mail two months in advance. If you don't receive a letter, please call our office to schedule the follow-up appointment.  * If you need a refill on your cardiac medications before your next appointment, please call your pharmacy.   *Please note that any paperwork needing to be filled out by the provider will need to be addressed at the front desk prior to seeing the provider. Please note that any FMLA, disability or other documents regarding health condition is subject to a $25.00 charge that must be received prior to completion of paperwork in the form of a money order or check.  Thank you for choosing [facility]!!   Miche Loughridge, RN [phone]        

## 2018-02-09 NOTE — Progress Notes (Signed)
Electrophysiology Office Note   Date:  02/09/2018   ID:  Chelsea Hancock, DOB August 16, 1934, MRN [id number]  PCP:  Lajean Manes, MD  Cardiologist:  [name] Primary Electrophysiologist:  Constance Haw, MD    No chief complaint on file.    History of Present Illness: Chelsea Hancock is a 84 y.o. female who presents today for electrophysiology evaluation.   She had an ablation for atrial fib/flutter on 07/2015. Repeat ablation scheduled for 08/15/16. Since that time, she is felt well without major complaint. She did yard work last week without any major problems.  She reverted back to atrial fibrillation and had a repeat cardioversion on 10/22/17.  TEE showed an ASD.  Review of cardiac CT done prior to ablation showed no evidence of ASD.  Today, denies symptoms of palpitations, chest pain, shortness of breath, orthopnea, PND, lower extremity edema, claudication, dizziness, presyncope, syncope, bleeding, or neurologic sequela. The patient is tolerating medications without difficulties.  Overall she is feeling well.  She has no major complaints today.  Past Medical History:  Diagnosis Date  . Atrial fibrillation and flutter ([place])    a. s/p PVI ablation in 07/2015 with continue PAF  b. s/p repeat ablation on 08/15/16. continued on Flecainide 100mg  BID  . Carotid bruit    Right  . Chronic back pain   . Depression with anxiety 03/29/2013  . Diverticulosis   . Fibromyalgia   . Foot drop, right   . GERD (gastroesophageal reflux disease)   . Hemorrhoids   . HTN (hypertension)   . Hyperlipidemia   . IBS (irritable bowel syndrome)   . Memory loss 02/11/2015  . Onychomycosis 07/03/2013  . Osteoporosis   . Sigmoid colon ulcer 03/23/2012   Past Surgical History:  Procedure Laterality Date  . ABDOMINAL HYSTERECTOMY  1981  . BACK SURGERY  1997   reconstructive lower back surgery  . CARDIOVERSION N/A 07/01/2016   Procedure: CARDIOVERSION;  Surgeon: Thayer Headings, MD;   Location: [facility];  Service: Cardiovascular;  Laterality: N/A;  . CARDIOVERSION N/A 10/22/2017   Procedure: CARDIOVERSION;  Surgeon: Pixie Casino, MD;  Location: [facility] ENDOSCOPY;  Service: Cardiovascular;  Laterality: N/A;  . CARPAL TUNNEL RELEASE Left 2012   ulnar nerve release at elbow  . CERVICAL FUSION  2003, 2005, 2013  . COLONOSCOPY    . ELECTROPHYSIOLOGIC STUDY N/A 08/10/2015   Procedure: Afib;  Surgeon: Will Meredith Leeds, MD;  Location: [facility] CV LAB;  Service: Cardiovascular;  Laterality: N/A;  . ELECTROPHYSIOLOGIC STUDY N/A 08/15/2016   Procedure: Atrial Fibrillation Ablation;  Surgeon: Will Meredith Leeds, MD;  Location: [place] CV LAB;  Service: Cardiovascular;  Laterality: N/A;  . EYE SURGERY  2015   b/l cataracts removed  . left elbow surgery  06/03/11   cyst removed  . LUMBAR LAMINECTOMY  1960   x 2  . POSTERIOR CERVICAL FUSION/FORAMINOTOMY  08/03/2012   Procedure: POSTERIOR CERVICAL FUSION/FORAMINOTOMY LEVEL 5;  Surgeon: [name], MD;  Location: [facility] NEURO ORS;  Service: Neurosurgery;  Laterality: N/A;  Cervical four to Thoracic two Posterior cervical decompression with Facet and Pedicle screw fixation  . TEE WITHOUT CARDIOVERSION N/A 10/22/2017   Procedure: TRANSESOPHAGEAL ECHOCARDIOGRAM (TEE);  Surgeon: Pixie Casino, MD;  Location: [facility] ENDOSCOPY;  Service: Cardiovascular;  Laterality: N/A;  . TONSILLECTOMY  1943  . TOTAL HIP ARTHROPLASTY Left 2012  . TOTAL KNEE ARTHROPLASTY Right 11/20/2014   Procedure: RIGHT TOTAL KNEE ARTHROPLASTY;  Surgeon: [name], MD;  Location: [facility] ORS;  Service: Orthopedics;  Laterality: Right;  . veins stripped  1970  . WRIST SURGERY Right 1990's   cyst removed     Current Outpatient Medications  Medication Sig Dispense Refill  . amiodarone (PACERONE) 200 MG tablet Take 1 tablet (200 mg total) by mouth daily. 90 tablet 2  . apixaban (ELIQUIS) 5 MG TABS tablet Take 1 tablet (5 mg total) by mouth 2 (two) times daily.  90 tablet 0  . atorvastatin (LIPITOR) 10 MG tablet Take 10 mg by mouth daily.    . bisoprolol (ZEBETA) 5 MG tablet Take 1 tablet (5 mg total) by mouth daily. 135 tablet 1  . Calcium Carb-Cholecalciferol 600-200 MG-UNIT TABS Take 1 tablet by mouth daily.    Marland Kitchen denosumab (PROLIA) 60 MG/ML SOSY injection Inject 60 mg into the skin every 6 (six) months.    . esomeprazole (NEXIUM) 40 MG capsule TAKE ONE CAPSULE BY MOUTH EVERY DAY AT 12 NOON 90 capsule 0  . eszopiclone (LUNESTA) 2 MG TABS tablet Take 1 tablet (2 mg total) by mouth at bedtime as needed for sleep. Take immediately before bedtime (Patient taking differently: Take 2 mg by mouth at bedtime. Take immediately before bedtime) 30 tablet 2  . gabapentin (NEURONTIN) 600 MG tablet TAKE 1 TABLET(600 MG) BY MOUTH TWICE DAILY 180 tablet 0  . MEGARED OMEGA-3 KRILL OIL 500 MG CAPS Take 500 mg by mouth daily.     . Melatonin 10 MG CAPS Take 10 mg by mouth at bedtime.    . Multiple Vitamins-Minerals (CENTRUM SILVER ULTRA WOMENS) TABS Take 1 tablet by mouth daily.    . nortriptyline (PAMELOR) 25 MG capsule Take 50 mg by mouth at bedtime.     Vladimir Faster Glycol-Propyl Glycol (SYSTANE OP) Place 1 drop into both eyes daily as needed (for dry eyes).    Marland Kitchen ZETIA 10 MG tablet TAKE 1 TABLET BY MOUTH EVERY DAY (Patient taking differently: TAKE 10 MG BY MOUTH AT BEDTIME) 30 tablet 6   No current facility-administered medications for this visit.     Allergies:   Cefuroxime axetil; Macrodantin; Alendronate sodium; Codeine; Demerol [meperidine]; Meperidine hcl; Other; Penicillins; and Sulfonamide derivatives   Social History:  The patient  reports that she has quit smoking. Her smoking use included cigarettes. She has a 1.00 pack-year smoking history. She has never used smokeless tobacco. She reports that she drinks alcohol. She reports that she does not use drugs.   Family History:  The patient's family history includes Anxiety disorder in her mother; Bipolar  disorder in her mother; Colon cancer in her father; HIV in her son; Heart disease in her maternal grandmother; Mental illness in her mother; Stomach cancer in her maternal grandfather, mother, and paternal grandmother; Stroke in her father; Throat cancer in her maternal uncle.   ROS:  Please see the history of present illness.   Otherwise, review of systems is positive for chest pain, anxiety, balance problems.   All other systems are reviewed and negative.   PHYSICAL EXAM: VS:  BP (!) 152/70   Pulse (!) 51   Ht 5\' 4"  (1.626 m)   Wt 144 lb (65.3 kg)   SpO2 94%   BMI 24.72 kg/m  , BMI Body mass index is 24.72 kg/m. GEN: Well nourished, well developed, in no acute distress  HEENT: normal  Neck: no JVD, carotid bruits, or masses Cardiac: RRR; no murmurs, rubs, or gallops,no edema  Respiratory:  clear to auscultation bilaterally, normal work of  breathing GI: soft, nontender, nondistended, + BS MS: no deformity or atrophy  Skin: warm and dry Neuro:  Strength and sensation are intact Psych: euthymic mood, full affect  EKG:  EKG is ordered today. Personal review of the ekg ordered shows sinus bradycardia, first-degree AV block, left axis deviation, moderate voltage criteria for LVH, rate 49  Recent Labs: 10/19/2017: BUN 20; Creatinine, Ser 0.66; Hemoglobin 14.0; Platelets 251; Potassium 4.0; Sodium 140; TSH 3.796    Lipid Panel     Component Value Date/Time   CHOL 187 08/03/2014 1053   TRIG 150.0 (H) 08/03/2014 1053   TRIG 133 06/30/2006 1110   HDL 45.80 08/03/2014 1053   CHOLHDL 4 08/03/2014 1053   VLDL 30.0 08/03/2014 1053   LDLCALC 111 (H) 08/03/2014 1053   LDLDIRECT 93.3 08/04/2008 0915     Wt Readings from Last 3 Encounters:  02/09/18 144 lb (65.3 kg)  11/09/17 139 lb (63 kg)  10/19/17 140 lb (63.5 kg)      Other studies Reviewed: Additional studies/ records that were reviewed today include: TEE 10/22/17  Review of the above records today demonstrates:  - Left  ventricle: There was mild concentric hypertrophy. Systolic   function was mildly reduced. The estimated ejection fraction was   in the range of 45% to 50%. Diffuse hypokinesis. No evidence of   thrombus. - Aortic valve: No evidence of vegetation. - Mitral valve: There was mild regurgitation. - Left atrium: No evidence of thrombus in the atrial cavity or   appendage. No evidence of thrombus in the atrial cavity or   appendage. - Right atrium: No evidence of thrombus in the atrial cavity or   appendage. 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 This was visualized in 2D and 3D modes. - Tricuspid valve: There was mild regurgitation. - Pulmonic valve: There was trivial regurgitation.   ASSESSMENT AND PLAN:  1.  Atrial fibrillation: Status post 2 ablations most recent 08/15/2016.  She was put on flecainide after the last procedure.  She is felt well without recurrence of atrial fibrillation since her cardioversion.  She did miss a dose of flecainide prior to going into atrial fibrillation that time.  No changes at this time.    This patients CHA2DS2-VASc Score and unadjusted Ischemic Stroke Rate (% per year) is equal to 3.2 % stroke rate/year from a score of 3  Above score calculated as 1 point each if present [CHF, HTN, DM, Vascular=MI/PAD/Aortic Plaque, Age if 65-74, or Female] Above score calculated as 2 points each if present [Age > 75, or Stroke/TIA/TE]   2. Hypertension: Pressure elevated today but has been normal in the past.  No changes.  3. Hyperlipidemia: Continue atorvastatin and Zetia  Current medicines are reviewed at length with the patient today.   The patient does not have concerns regarding her medicines.  The following changes were made today:  None  Labs/ tests ordered today include:  Orders Placed This Encounter  Procedures  . EKG 12-Lead     Disposition:   FU with Will Camnitz 6  months  Signed, Will Meredith Leeds, MD  02/09/2018 12:26 PM     [address] [place] [address] [phone] (office) [phone] (fax)

## 2018-02-11 DIAGNOSIS — H1851 Endothelial corneal dystrophy: Secondary | ICD-10-CM | POA: Diagnosis not present

## 2018-02-11 DIAGNOSIS — H04123 Dry eye syndrome of bilateral lacrimal glands: Secondary | ICD-10-CM | POA: Diagnosis not present

## 2018-02-11 DIAGNOSIS — H40013 Open angle with borderline findings, low risk, bilateral: Secondary | ICD-10-CM | POA: Diagnosis not present

## 2018-03-01 ENCOUNTER — Other Ambulatory Visit: Payer: Self-pay | Admitting: Cardiology

## 2018-03-28 ENCOUNTER — Other Ambulatory Visit: Payer: Self-pay | Admitting: Cardiology

## 2018-03-29 DIAGNOSIS — M810 Age-related osteoporosis without current pathological fracture: Secondary | ICD-10-CM | POA: Diagnosis not present

## 2018-03-29 NOTE — Telephone Encounter (Signed)
Eliquis 5mg  refill request received; pt is 84 years old, wt-65.3kg, Crea-0.66 on 10/19/17, last seen by Dr. Curt Bears ib 02/09/18; will send in refill to requested pharmacy.

## 2018-04-02 DIAGNOSIS — Z1231 Encounter for screening mammogram for malignant neoplasm of breast: Secondary | ICD-10-CM | POA: Diagnosis not present

## 2018-04-02 DIAGNOSIS — Z803 Family history of malignant neoplasm of breast: Secondary | ICD-10-CM | POA: Diagnosis not present

## 2018-04-29 DIAGNOSIS — M797 Fibromyalgia: Secondary | ICD-10-CM | POA: Diagnosis not present

## 2018-04-29 DIAGNOSIS — M179 Osteoarthritis of knee, unspecified: Secondary | ICD-10-CM | POA: Diagnosis not present

## 2018-04-29 DIAGNOSIS — M810 Age-related osteoporosis without current pathological fracture: Secondary | ICD-10-CM | POA: Diagnosis not present

## 2018-04-29 DIAGNOSIS — E559 Vitamin D deficiency, unspecified: Secondary | ICD-10-CM | POA: Diagnosis not present

## 2018-05-15 ENCOUNTER — Other Ambulatory Visit: Payer: Self-pay | Admitting: Cardiology

## 2018-06-09 DIAGNOSIS — L309 Dermatitis, unspecified: Secondary | ICD-10-CM | POA: Diagnosis not present

## 2018-06-23 DIAGNOSIS — Z23 Encounter for immunization: Secondary | ICD-10-CM | POA: Diagnosis not present

## 2018-07-08 DIAGNOSIS — L2084 Intrinsic (allergic) eczema: Secondary | ICD-10-CM | POA: Diagnosis not present

## 2018-07-16 ENCOUNTER — Encounter: Payer: Self-pay | Admitting: *Deleted

## 2018-07-22 ENCOUNTER — Other Ambulatory Visit: Payer: Self-pay | Admitting: Geriatric Medicine

## 2018-07-22 DIAGNOSIS — Z0000 Encounter for general adult medical examination without abnormal findings: Secondary | ICD-10-CM | POA: Diagnosis not present

## 2018-07-22 DIAGNOSIS — R200 Anesthesia of skin: Secondary | ICD-10-CM | POA: Diagnosis not present

## 2018-07-22 DIAGNOSIS — I480 Paroxysmal atrial fibrillation: Secondary | ICD-10-CM | POA: Diagnosis not present

## 2018-07-22 DIAGNOSIS — Z79899 Other long term (current) drug therapy: Secondary | ICD-10-CM | POA: Diagnosis not present

## 2018-07-22 DIAGNOSIS — Z780 Asymptomatic menopausal state: Secondary | ICD-10-CM | POA: Diagnosis not present

## 2018-07-22 DIAGNOSIS — K219 Gastro-esophageal reflux disease without esophagitis: Secondary | ICD-10-CM | POA: Diagnosis not present

## 2018-07-22 DIAGNOSIS — K9089 Other intestinal malabsorption: Secondary | ICD-10-CM | POA: Diagnosis not present

## 2018-07-22 DIAGNOSIS — E7800 Pure hypercholesterolemia, unspecified: Secondary | ICD-10-CM | POA: Diagnosis not present

## 2018-07-22 DIAGNOSIS — Z1389 Encounter for screening for other disorder: Secondary | ICD-10-CM | POA: Diagnosis not present

## 2018-07-22 DIAGNOSIS — R202 Paresthesia of skin: Secondary | ICD-10-CM | POA: Diagnosis not present

## 2018-07-22 DIAGNOSIS — E2839 Other primary ovarian failure: Secondary | ICD-10-CM

## 2018-07-22 DIAGNOSIS — M8580 Other specified disorders of bone density and structure, unspecified site: Secondary | ICD-10-CM

## 2018-07-22 DIAGNOSIS — E039 Hypothyroidism, unspecified: Secondary | ICD-10-CM | POA: Diagnosis not present

## 2018-07-22 DIAGNOSIS — R413 Other amnesia: Secondary | ICD-10-CM | POA: Diagnosis not present

## 2018-08-06 DIAGNOSIS — G3184 Mild cognitive impairment, so stated: Secondary | ICD-10-CM | POA: Diagnosis not present

## 2018-08-06 DIAGNOSIS — I480 Paroxysmal atrial fibrillation: Secondary | ICD-10-CM | POA: Diagnosis not present

## 2018-08-09 ENCOUNTER — Ambulatory Visit: Payer: Medicare Other | Admitting: Cardiology

## 2018-08-09 NOTE — Progress Notes (Deleted)
Electrophysiology Office Note   Date:  08/09/2018   ID:  Chelsea Hancock, DOB 22-Nov-1933, MRN [id number]  PCP:  Lajean Manes, MD  Cardiologist:  [name] Primary Electrophysiologist:  Constance Haw, MD    No chief complaint on file.    History of Present Illness: Chelsea Hancock is a 84 y.o. female who presents today for electrophysiology evaluation.   She had an ablation for atrial fib/flutter on 07/2015. Repeat ablation scheduled for 08/15/16. Since that time, she is felt well without major complaint. She did yard work last week without any major problems.  She reverted back to atrial fibrillation and had a repeat cardioversion on 10/22/17.  TEE showed an ASD.  Review of cardiac CT done prior to ablation showed no evidence of ASD.  Today, denies symptoms of palpitations, chest pain, shortness of breath, orthopnea, PND, lower extremity edema, claudication, dizziness, presyncope, syncope, bleeding, or neurologic sequela. The patient is tolerating medications without difficulties. ***   Past Medical History:  Diagnosis Date  . Atrial fibrillation and flutter ([place])    a. s/p PVI ablation in 07/2015 with continue PAF  b. s/p repeat ablation on 08/15/16. continued on Flecainide 100mg  BID  . Carotid bruit    Right  . Chronic back pain   . Depression with anxiety 03/29/2013  . Diverticulosis   . Fibromyalgia   . Foot drop, right   . GERD (gastroesophageal reflux disease)   . Hemorrhoids   . HTN (hypertension)   . Hyperlipidemia   . IBS (irritable bowel syndrome)   . Memory loss 02/11/2015  . Onychomycosis 07/03/2013  . Osteoporosis   . Sigmoid colon ulcer 03/23/2012   Past Surgical History:  Procedure Laterality Date  . ABDOMINAL HYSTERECTOMY  1981  . BACK SURGERY  1997   reconstructive lower back surgery  . CARDIOVERSION N/A 07/01/2016   Procedure: CARDIOVERSION;  Surgeon: Thayer Headings, MD;  Location: [place];  Service: Cardiovascular;   Laterality: N/A;  . CARDIOVERSION N/A 10/22/2017   Procedure: CARDIOVERSION;  Surgeon: Pixie Casino, MD;  Location: [facility] ENDOSCOPY;  Service: Cardiovascular;  Laterality: N/A;  . CARPAL TUNNEL RELEASE Left 2012   ulnar nerve release at elbow  . CERVICAL FUSION  2003, 2005, 2013  . COLONOSCOPY    . ELECTROPHYSIOLOGIC STUDY N/A 08/10/2015   Procedure: Afib;  Surgeon: Evadna Donaghy Meredith Leeds, MD;  Location: [place] CV LAB;  Service: Cardiovascular;  Laterality: N/A;  . ELECTROPHYSIOLOGIC STUDY N/A 08/15/2016   Procedure: Atrial Fibrillation Ablation;  Surgeon: Almarosa Bohac Meredith Leeds, MD;  Location: [place] CV LAB;  Service: Cardiovascular;  Laterality: N/A;  . EYE SURGERY  2015   b/l cataracts removed  . left elbow surgery  06/03/11   cyst removed  . LUMBAR LAMINECTOMY  1960   x 2  . POSTERIOR CERVICAL FUSION/FORAMINOTOMY  08/03/2012   Procedure: POSTERIOR CERVICAL FUSION/FORAMINOTOMY LEVEL 5;  Surgeon: [name], MD;  Location: [place] NEURO ORS;  Service: Neurosurgery;  Laterality: N/A;  Cervical four to Thoracic two Posterior cervical decompression with Facet and Pedicle screw fixation  . TEE WITHOUT CARDIOVERSION N/A 10/22/2017   Procedure: TRANSESOPHAGEAL ECHOCARDIOGRAM (TEE);  Surgeon: Pixie Casino, MD;  Location: [facility] ENDOSCOPY;  Service: Cardiovascular;  Laterality: N/A;  . TONSILLECTOMY  1943  . TOTAL HIP ARTHROPLASTY Left 2012  . TOTAL KNEE ARTHROPLASTY Right 11/20/2014   Procedure: RIGHT TOTAL KNEE ARTHROPLASTY;  Surgeon: [name], MD;  Location: [facility]S;  Service: Orthopedics;  Laterality: Right;  .  veins stripped  1970  . WRIST SURGERY Right 1990's   cyst removed     Current Outpatient Medications  Medication Sig Dispense Refill  . amiodarone (PACERONE) 200 MG tablet Take 1 tablet (200 mg total) by mouth daily. 90 tablet 2  . atorvastatin (LIPITOR) 10 MG tablet Take 10 mg by mouth daily.    . bisoprolol (ZEBETA) 5 MG tablet Take 1 tablet (5 mg total) by mouth daily.  135 tablet 1  . Calcium Carb-Cholecalciferol 600-200 MG-UNIT TABS Take 1 tablet by mouth daily.    Marland Kitchen denosumab (PROLIA) 60 MG/ML SOSY injection Inject 60 mg into the skin every 6 (six) months.    Marland Kitchen ELIQUIS 5 MG TABS tablet TAKE 1 TABLET(5 MG) BY MOUTH TWICE DAILY 90 tablet 2  . esomeprazole (NEXIUM) 40 MG capsule TAKE ONE CAPSULE BY MOUTH EVERY DAY AT 12 NOON 90 capsule 2  . eszopiclone (LUNESTA) 2 MG TABS tablet Take 1 tablet (2 mg total) by mouth at bedtime as needed for sleep. Take immediately before bedtime (Patient taking differently: Take 2 mg by mouth at bedtime. Take immediately before bedtime) 30 tablet 2  . gabapentin (NEURONTIN) 600 MG tablet TAKE 1 TABLET(600 MG) BY MOUTH TWICE DAILY 180 tablet 0  . MEGARED OMEGA-3 KRILL OIL 500 MG CAPS Take 500 mg by mouth daily.     . Melatonin 10 MG CAPS Take 10 mg by mouth at bedtime.    . Multiple Vitamins-Minerals (CENTRUM SILVER ULTRA WOMENS) TABS Take 1 tablet by mouth daily.    . nortriptyline (PAMELOR) 25 MG capsule Take 50 mg by mouth at bedtime.     Vladimir Faster Glycol-Propyl Glycol (SYSTANE OP) Place 1 drop into both eyes daily as needed (for dry eyes).    Marland Kitchen ZETIA 10 MG tablet TAKE 1 TABLET BY MOUTH EVERY DAY (Patient taking differently: TAKE 10 MG BY MOUTH AT BEDTIME) 30 tablet 6   No current facility-administered medications for this visit.     Allergies:   Cefuroxime axetil; Macrodantin; Alendronate sodium; Codeine; Demerol [meperidine]; Meperidine hcl; Other; Penicillins; and Sulfonamide derivatives   Social History:  The patient  reports that she has quit smoking. Her smoking use included cigarettes. She has a 1.00 pack-year smoking history. She has never used smokeless tobacco. She reports that she drinks alcohol. She reports that she does not use drugs.   Family History:  The patient's family history includes Anxiety disorder in her mother; Bipolar disorder in her mother; Colon cancer in her father; HIV/AIDS in her son; Heart  disease in her maternal grandmother; Mental illness in her mother; Stomach cancer in her maternal grandfather, mother, and paternal grandmother; Stroke in her father; Throat cancer in her maternal uncle.   ROS:  Please see the history of present illness.   Otherwise, review of systems is positive for ***.   All other systems are reviewed and negative.   PHYSICAL EXAM: VS:  There were no vitals taken for this visit. , BMI There is no height or weight on file to calculate BMI. GEN: Well nourished, well developed, in no acute distress  HEENT: normal  Neck: no JVD, carotid bruits, or masses Cardiac: ***RRR; no murmurs, rubs, or gallops,no edema  Respiratory:  clear to auscultation bilaterally, normal work of breathing GI: soft, nontender, nondistended, + BS MS: no deformity or atrophy  Skin: warm and dry Neuro:  Strength and sensation are intact Psych: euthymic mood, full affect  EKG:  EKG {ACTION; IS/IS [id number]} ordered today. Personal  review of the ekg ordered *** shows ***   Recent Labs: 10/19/2017: BUN 20; Creatinine, Ser 0.66; Hemoglobin 14.0; Platelets 251; Potassium 4.0; Sodium 140; TSH 3.796    Lipid Panel     Component Value Date/Time   CHOL 187 08/03/2014 1053   TRIG 150.0 (H) 08/03/2014 1053   TRIG 133 06/30/2006 1110   HDL 45.80 08/03/2014 1053   CHOLHDL 4 08/03/2014 1053   VLDL 30.0 08/03/2014 1053   LDLCALC 111 (H) 08/03/2014 1053   LDLDIRECT 93.3 08/04/2008 0915     Wt Readings from Last 3 Encounters:  02/09/18 144 lb (65.3 kg)  11/09/17 139 lb (63 kg)  10/19/17 140 lb (63.5 kg)      Other studies Reviewed: Additional studies/ records that were reviewed today include: TEE 10/22/17  Review of the above records today demonstrates:  - Left ventricle: There was mild concentric hypertrophy. Systolic   function was mildly reduced. The estimated ejection fraction was   in the range of 45% to 50%. Diffuse hypokinesis. No evidence of   thrombus. - Aortic  valve: No evidence of vegetation. - Mitral valve: There was mild regurgitation. - Left atrium: No evidence of thrombus in the atrial cavity or   appendage. No evidence of thrombus in the atrial cavity or   appendage. - Right atrium: No evidence of thrombus in the atrial cavity or   appendage. 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 This was visualized in 2D and 3D modes. - Tricuspid valve: There was mild regurgitation. - Pulmonic valve: There was trivial regurgitation.   ASSESSMENT AND PLAN:  1.  Atrial fibrillation: ***  Status post 2 ablations most recent 08/15/2016.  She was put on flecainide after the last procedure.  She is felt well without recurrence of atrial fibrillation since her cardioversion.  She did miss a dose of flecainide prior to going into atrial fibrillation that time.  No changes at this time.    This patients CHA2DS2-VASc Score and unadjusted Ischemic Stroke Rate (% per year) is equal to 3.2 % stroke rate/year from a score of 3  Above score calculated as 1 point each if present [CHF, HTN, DM, Vascular=MI/PAD/Aortic Plaque, Age if 65-74, or Female] Above score calculated as 2 points each if present [Age > 75, or Stroke/TIA/TE]  2. Hypertension: *** 3. Hyperlipidemia: Due atorvastatin and Zetia  Current medicines are reviewed at length with the patient today.   The patient does not have concerns regarding her medicines.  The following changes were made today: ***  Labs/ tests ordered today include:  No orders of the defined types were placed in this encounter.    Disposition:   FU with Britain Saber *** months  Signed, Elene Downum Meredith Leeds, MD  08/09/2018 2:27 PM     [place]  [address] [phone] (office) [phone] (fax)

## 2018-08-11 ENCOUNTER — Encounter: Payer: Self-pay | Admitting: Cardiology

## 2018-08-16 ENCOUNTER — Other Ambulatory Visit: Payer: Self-pay | Admitting: Cardiology

## 2018-08-17 DIAGNOSIS — H04123 Dry eye syndrome of bilateral lacrimal glands: Secondary | ICD-10-CM | POA: Diagnosis not present

## 2018-08-17 DIAGNOSIS — G5600 Carpal tunnel syndrome, unspecified upper limb: Secondary | ICD-10-CM | POA: Diagnosis not present

## 2018-08-17 DIAGNOSIS — Z961 Presence of intraocular lens: Secondary | ICD-10-CM | POA: Diagnosis not present

## 2018-08-17 DIAGNOSIS — H40013 Open angle with borderline findings, low risk, bilateral: Secondary | ICD-10-CM | POA: Diagnosis not present

## 2018-08-17 DIAGNOSIS — G629 Polyneuropathy, unspecified: Secondary | ICD-10-CM | POA: Diagnosis not present

## 2018-08-17 DIAGNOSIS — H35033 Hypertensive retinopathy, bilateral: Secondary | ICD-10-CM | POA: Diagnosis not present

## 2018-08-18 DIAGNOSIS — M810 Age-related osteoporosis without current pathological fracture: Secondary | ICD-10-CM | POA: Diagnosis not present

## 2018-08-20 DIAGNOSIS — J029 Acute pharyngitis, unspecified: Secondary | ICD-10-CM | POA: Diagnosis not present

## 2018-08-20 DIAGNOSIS — J069 Acute upper respiratory infection, unspecified: Secondary | ICD-10-CM | POA: Diagnosis not present

## 2018-09-11 ENCOUNTER — Other Ambulatory Visit: Payer: Self-pay | Admitting: Cardiology

## 2018-09-24 ENCOUNTER — Encounter: Payer: Self-pay | Admitting: *Deleted

## 2018-09-27 ENCOUNTER — Other Ambulatory Visit: Payer: Medicare Other

## 2018-09-29 DIAGNOSIS — G5602 Carpal tunnel syndrome, left upper limb: Secondary | ICD-10-CM | POA: Diagnosis not present

## 2018-09-30 ENCOUNTER — Encounter: Payer: Self-pay | Admitting: Cardiology

## 2018-09-30 ENCOUNTER — Ambulatory Visit (INDEPENDENT_AMBULATORY_CARE_PROVIDER_SITE_OTHER): Payer: Medicare Other | Admitting: Cardiology

## 2018-09-30 VITALS — BP 124/62 | HR 48 | Ht 64.0 in | Wt 148.0 lb

## 2018-09-30 DIAGNOSIS — Z79899 Other long term (current) drug therapy: Secondary | ICD-10-CM | POA: Diagnosis not present

## 2018-09-30 DIAGNOSIS — I10 Essential (primary) hypertension: Secondary | ICD-10-CM | POA: Diagnosis not present

## 2018-09-30 DIAGNOSIS — I4819 Other persistent atrial fibrillation: Secondary | ICD-10-CM

## 2018-09-30 DIAGNOSIS — E785 Hyperlipidemia, unspecified: Secondary | ICD-10-CM

## 2018-09-30 DIAGNOSIS — E039 Hypothyroidism, unspecified: Secondary | ICD-10-CM | POA: Diagnosis not present

## 2018-09-30 NOTE — Patient Instructions (Signed)
Medication Instructions:  Your physician recommends that you continue on your current medications as directed. Please refer to the Current Medication list given to you today.  * If you need a refill on your cardiac medications before your next appointment, please call your pharmacy.   Labwork: Amiodarone surveillance labs today: BMET, CBC, TSH & LFTs *We will only notify you of abnormal results, otherwise continue current treatment plan.  Testing/Procedures: None ordered  Follow-Up: Your physician wants you to follow-up in: 6 months with Dr. Curt Bears.  You will receive a reminder letter in the mail two months in advance. If you don't receive a letter, please call our office to schedule the follow-up appointment.  Thank you for choosing [facility]!!   Trinidad Curet, RN [phone]

## 2018-09-30 NOTE — Addendum Note (Signed)
Addended by: Stanton Kidney on: 09/30/2018 12:01 PM   Modules accepted: Orders

## 2018-09-30 NOTE — Progress Notes (Signed)
Electrophysiology Office Note   Date:  09/30/2018   ID:  Chelsea Hancock, DOB [birth year], MRN [id number]  PCP:  Lajean Manes, MD  Cardiologist:  [name] Primary Electrophysiologist:  Constance Haw, MD    No chief complaint on file.    History of Present Illness: Chelsea Hancock is a 85 y.o. female who presents today for electrophysiology evaluation.   She had an ablation for atrial fib/flutter on 07/2015. Repeat ablation scheduled for 08/15/16. Since that time, she is felt well without major complaint. She did yard work last week without any major problems.  She reverted back to atrial fibrillation and had a repeat cardioversion on 10/22/17.  TEE showed an ASD.  Review of cardiac CT done prior to ablation showed no evidence of ASD.  Today, denies symptoms of palpitations, chest pain, shortness of breath, orthopnea, PND, claudication, dizziness, presyncope, syncope, bleeding, or neurologic sequela. The patient is tolerating medications without difficulties.  She has noted some significant lower extremity swelling.  This occurs mainly towards the end of the day.  It is much improved when she gets up in the morning from sleep.  Past Medical History:  Diagnosis Date  . Atrial fibrillation and flutter ([place])    a. s/p PVI ablation in 07/2015 with continue PAF  b. s/p repeat ablation on 08/15/16. continued on Flecainide 100mg  BID  . Carotid bruit    Right  . Chronic back pain   . Depression with anxiety 03/29/2013  . Diverticulosis   . Fibromyalgia   . Foot drop, right   . GERD (gastroesophageal reflux disease)   . Hemorrhoids   . HTN (hypertension)   . Hyperlipidemia   . IBS (irritable bowel syndrome)   . Memory loss 02/11/2015  . Onychomycosis 07/03/2013  . Osteoporosis   . Sigmoid colon ulcer 03/23/2012   Past Surgical History:  Procedure Laterality Date  . ABDOMINAL HYSTERECTOMY  1981  . BACK SURGERY  1997   reconstructive lower back surgery  .  CARDIOVERSION N/A 07/01/2016   Procedure: CARDIOVERSION;  Surgeon: Thayer Headings, MD;  Location: [place];  Service: Cardiovascular;  Laterality: N/A;  . CARDIOVERSION N/A 10/22/2017   Procedure: CARDIOVERSION;  Surgeon: Pixie Casino, MD;  Location: [facility] ENDOSCOPY;  Service: Cardiovascular;  Laterality: N/A;  . CARPAL TUNNEL RELEASE Left 2012   ulnar nerve release at elbow  . CERVICAL FUSION  2003, 2005, 2013  . COLONOSCOPY    . ELECTROPHYSIOLOGIC STUDY N/A 08/10/2015   Procedure: Afib;  Surgeon: Angelissa Supan Meredith Leeds, MD;  Location: [place] CV LAB;  Service: Cardiovascular;  Laterality: N/A;  . ELECTROPHYSIOLOGIC STUDY N/A 08/15/2016   Procedure: Atrial Fibrillation Ablation;  Surgeon: Anneke Cundy Meredith Leeds, MD;  Location: [place] CV LAB;  Service: Cardiovascular;  Laterality: N/A;  . EYE SURGERY  2015   b/l cataracts removed  . left elbow surgery  06/03/11   cyst removed  . LUMBAR LAMINECTOMY  1960   x 2  . POSTERIOR CERVICAL FUSION/FORAMINOTOMY  08/03/2012   Procedure: POSTERIOR CERVICAL FUSION/FORAMINOTOMY LEVEL 5;  Surgeon: [name], MD;  Location: [place] NEURO ORS;  Service: Neurosurgery;  Laterality: N/A;  Cervical four to Thoracic two Posterior cervical decompression with Facet and Pedicle screw fixation  . TEE WITHOUT CARDIOVERSION N/A 10/22/2017   Procedure: TRANSESOPHAGEAL ECHOCARDIOGRAM (TEE);  Surgeon: Pixie Casino, MD;  Location: [facility] ENDOSCOPY;  Service: Cardiovascular;  Laterality: N/A;  . TONSILLECTOMY  1943  . TOTAL HIP ARTHROPLASTY Left 2012  . TOTAL KNEE  ARTHROPLASTY Right 11/20/2014   Procedure: RIGHT TOTAL KNEE ARTHROPLASTY;  Surgeon: [name], MD;  Location: [facility]S;  Service: Orthopedics;  Laterality: Right;  . veins stripped  1970  . WRIST SURGERY Right 1990's   cyst removed     Current Outpatient Medications  Medication Sig Dispense Refill  . amiodarone (PACERONE) 200 MG tablet TAKE 1 TABLET BY MOUTH DAILY 90 tablet 1  . atorvastatin  (LIPITOR) 10 MG tablet Take 10 mg by mouth daily.    . bisoprolol (ZEBETA) 5 MG tablet Take 1 tablet (5 mg total) by mouth daily. 135 tablet 1  . Calcium Carb-Cholecalciferol 600-200 MG-UNIT TABS Take 1 tablet by mouth daily.    . clobetasol (TEMOVATE) 0.05 % GEL APPLY TO THE AFFECTED AREA OF SKIN ON ARMS TWICE DAILY  0  . denosumab (PROLIA) 60 MG/ML SOSY injection Inject 60 mg into the skin every 6 (six) months.    Marland Kitchen ELIQUIS 5 MG TABS tablet TAKE 1 TABLET(5 MG) BY MOUTH TWICE DAILY 90 tablet 1  . esomeprazole (NEXIUM) 40 MG capsule TAKE ONE CAPSULE BY MOUTH EVERY DAY AT 12 NOON 90 capsule 2  . eszopiclone (LUNESTA) 2 MG TABS tablet Take 1 tablet (2 mg total) by mouth at bedtime as needed for sleep. Take immediately before bedtime (Patient taking differently: Take 2 mg by mouth at bedtime. Take immediately before bedtime) 30 tablet 2  . gabapentin (NEURONTIN) 600 MG tablet TAKE 1 TABLET(600 MG) BY MOUTH TWICE DAILY 180 tablet 0  . hydrOXYzine (ATARAX/VISTARIL) 10 MG tablet Take 10-20 mg by mouth at bedtime as needed for itching (sleep).    Marland Kitchen levothyroxine (SYNTHROID, LEVOTHROID) 25 MCG tablet Take 25 mcg by mouth daily.  5  . MEGARED OMEGA-3 KRILL OIL 500 MG CAPS Take 500 mg by mouth daily.     . Melatonin 10 MG CAPS Take 10 mg by mouth at bedtime.    . Multiple Vitamins-Minerals (CENTRUM SILVER ULTRA WOMENS) TABS Take 1 tablet by mouth daily.    . nortriptyline (PAMELOR) 25 MG capsule Take 50 mg by mouth at bedtime.     Vladimir Faster Glycol-Propyl Glycol (SYSTANE OP) Place 1 drop into both eyes daily as needed (for dry eyes).    Marland Kitchen ZETIA 10 MG tablet TAKE 1 TABLET BY MOUTH EVERY DAY (Patient taking differently: TAKE 10 MG BY MOUTH AT BEDTIME) 30 tablet 6   No current facility-administered medications for this visit.     Allergies:   Cefuroxime axetil; Macrodantin; Alendronate sodium; Codeine; Demerol [meperidine]; Meperidine hcl; Other; Penicillins; and Sulfonamide derivatives   Social History:   The patient  reports that she has quit smoking. Her smoking use included cigarettes. She has a 1.00 pack-year smoking history. She has never used smokeless tobacco. She reports current alcohol use. She reports that she does not use drugs.   Family History:  The patient's family history includes Anxiety disorder in her mother; Bipolar disorder in her mother; Colon cancer in her father; HIV/AIDS in her son; Healthy in her daughter; Heart disease in her maternal grandmother; Mental illness in her mother; Stomach cancer in her maternal grandfather, mother, and paternal grandmother; Stroke in her father; Throat cancer in her maternal uncle.   ROS:  Please see the history of present illness.   Otherwise, review of systems is positive for none.   All other systems are reviewed and negative.   PHYSICAL EXAM: VS:  BP 124/62   Pulse (!) 48   Ht 5\' 4"  (1.626  m)   Wt 148 lb (67.1 kg)   BMI 25.40 kg/m  , BMI Body mass index is 25.4 kg/m. GEN: Well nourished, well developed, in no acute distress  HEENT: normal  Neck: no JVD, carotid bruits, or masses Cardiac: RRR; no murmurs, rubs, or gallops,no edema  Respiratory:  clear to auscultation bilaterally, normal work of breathing GI: soft, nontender, nondistended, + BS MS: no deformity or atrophy  Skin: warm and dry Neuro:  Strength and sensation are intact Psych: euthymic mood, full affect  EKG:  EKG is ordered today. Personal review of the ekg ordered shows sinus rhythm, rate 48, voltage for LVH  Recent Labs: 10/19/2017: BUN 20; Creatinine, Ser 0.66; Hemoglobin 14.0; Platelets 251; Potassium 4.0; Sodium 140; TSH 3.796    Lipid Panel     Component Value Date/Time   CHOL 187 08/03/2014 1053   TRIG 150.0 (H) 08/03/2014 1053   TRIG 133 06/30/2006 1110   HDL 45.80 08/03/2014 1053   CHOLHDL 4 08/03/2014 1053   VLDL 30.0 08/03/2014 1053   LDLCALC 111 (H) 08/03/2014 1053   LDLDIRECT 93.3 08/04/2008 0915     Wt Readings from Last 3 Encounters:    09/30/18 148 lb (67.1 kg)  02/09/18 144 lb (65.3 kg)  11/09/17 139 lb (63 kg)      Other studies Reviewed: Additional studies/ records that were reviewed today include: TEE 10/22/17  Review of the above records today demonstrates:  - Left ventricle: There was mild concentric hypertrophy. Systolic   function was mildly reduced. The estimated ejection fraction was   in the range of 45% to 50%. Diffuse hypokinesis. No evidence of   thrombus. - Aortic valve: No evidence of vegetation. - Mitral valve: There was mild regurgitation. - Left atrium: No evidence of thrombus in the atrial cavity or   appendage. No evidence of thrombus in the atrial cavity or   appendage. - Right atrium: No evidence of thrombus in the atrial cavity or   appendage. 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 This was visualized in 2D and 3D modes. - Tricuspid valve: There was mild regurgitation. - Pulmonic valve: There was trivial regurgitation.   ASSESSMENT AND PLAN:  1.  Atrial fibrillation: Status post 2 ablations, most recently 08/15/2016.  She was put on flecainide after her last procedure.  She is now on amiodarone.  She is felt well without recurrence.  Willam Munford check amiodarone labs and Eliquis labs today.     This patients CHA2DS2-VASc Score and unadjusted Ischemic Stroke Rate (% per year) is equal to 3.2 % stroke rate/year from a score of 3  Above score calculated as 1 point each if present [CHF, HTN, DM, Vascular=MI/PAD/Aortic Plaque, Age if 65-74, or Female] Above score calculated as 2 points each if present [Age > 75, or Stroke/TIA/TE]   2. Hypertension: Well-controlled today.  No changes.  3. Hyperlipidemia: Continue atorvastatin and Zetia.  Current medicines are reviewed at  length with the patient today.   The patient does not have concerns regarding her medicines.  The following changes were made today: None  Labs/ tests ordered today include:  Orders Placed This Encounter  Procedures  . Basic metabolic panel  . CBC  . TSH  . Hepatic function panel  .  EKG 12-Lead     Disposition:   FU with Gurkirat Basher 6  months  Signed, Khaylee Mcevoy Meredith Leeds, MD  09/30/2018 11:56 AM     [facility] [address] [place] [address] [phone] (office) [phone] (fax)

## 2018-10-01 LAB — HEPATIC FUNCTION PANEL
ALT: 43 IU/L — ABNORMAL HIGH (ref 0–32)
AST: 28 IU/L (ref 0–40)
Albumin: 4.4 g/dL (ref 3.5–4.7)
Alkaline Phosphatase: 89 IU/L (ref 39–117)
Bilirubin Total: 0.2 mg/dL (ref 0.0–1.2)
Bilirubin, Direct: 0.07 mg/dL (ref 0.00–0.40)
Total Protein: 6.6 g/dL (ref 6.0–8.5)

## 2018-10-07 ENCOUNTER — Telehealth: Payer: Self-pay | Admitting: *Deleted

## 2018-10-07 DIAGNOSIS — R7401 Elevation of levels of liver transaminase levels: Secondary | ICD-10-CM

## 2018-10-07 DIAGNOSIS — R740 Nonspecific elevation of levels of transaminase and lactic acid dehydrogenase [LDH]: Principal | ICD-10-CM

## 2018-10-07 NOTE — Telephone Encounter (Signed)
-----   Message from Will Meredith Leeds, MD sent at 10/04/2018  9:59 AM EST ----- ALT elevated.  Was normal 6 months ago.  Would recheck in 1 month and may need to adjust amiodarone.

## 2018-10-07 NOTE — Telephone Encounter (Signed)
lmtcb

## 2018-10-11 ENCOUNTER — Other Ambulatory Visit (HOSPITAL_COMMUNITY): Payer: Self-pay | Admitting: Internal Medicine

## 2018-10-11 NOTE — Telephone Encounter (Signed)
Follow Up:     Returning Chelsea Hancock's call from Friday.

## 2018-10-11 NOTE — Telephone Encounter (Signed)
Requesting refill on Rx prescribed by Dr. Debara Pickett. Please address. Thank you.

## 2018-10-11 NOTE — Telephone Encounter (Signed)
Rx has been sent to the pharmacy electronically. ° °

## 2018-10-11 NOTE — Telephone Encounter (Signed)
Informed patient's daughter of patient's lab results.  She verbalized understanding and we scheduled labs ([facility]) for 11/11/18.

## 2018-11-01 DIAGNOSIS — M179 Osteoarthritis of knee, unspecified: Secondary | ICD-10-CM | POA: Diagnosis not present

## 2018-11-01 DIAGNOSIS — E559 Vitamin D deficiency, unspecified: Secondary | ICD-10-CM | POA: Diagnosis not present

## 2018-11-01 DIAGNOSIS — G5621 Lesion of ulnar nerve, right upper limb: Secondary | ICD-10-CM | POA: Diagnosis not present

## 2018-11-01 DIAGNOSIS — M797 Fibromyalgia: Secondary | ICD-10-CM | POA: Diagnosis not present

## 2018-11-01 DIAGNOSIS — G5602 Carpal tunnel syndrome, left upper limb: Secondary | ICD-10-CM | POA: Diagnosis not present

## 2018-11-01 DIAGNOSIS — M79673 Pain in unspecified foot: Secondary | ICD-10-CM | POA: Diagnosis not present

## 2018-11-01 DIAGNOSIS — M810 Age-related osteoporosis without current pathological fracture: Secondary | ICD-10-CM | POA: Diagnosis not present

## 2018-11-04 DIAGNOSIS — S92354A Nondisplaced fracture of fifth metatarsal bone, right foot, initial encounter for closed fracture: Secondary | ICD-10-CM | POA: Diagnosis not present

## 2018-11-04 DIAGNOSIS — M79671 Pain in right foot: Secondary | ICD-10-CM | POA: Diagnosis not present

## 2018-11-11 ENCOUNTER — Other Ambulatory Visit: Payer: Medicare Other | Admitting: *Deleted

## 2018-11-11 DIAGNOSIS — R7401 Elevation of levels of liver transaminase levels: Secondary | ICD-10-CM

## 2018-11-11 DIAGNOSIS — R740 Nonspecific elevation of levels of transaminase and lactic acid dehydrogenase [LDH]: Principal | ICD-10-CM

## 2018-11-11 LAB — HEPATIC FUNCTION PANEL
ALT: 29 IU/L (ref 0–32)
AST: 28 IU/L (ref 0–40)
Albumin: 4.4 g/dL (ref 3.6–4.6)
Alkaline Phosphatase: 93 IU/L (ref 39–117)
Bilirubin Total: 0.2 mg/dL (ref 0.0–1.2)
Bilirubin, Direct: 0.09 mg/dL (ref 0.00–0.40)
Total Protein: 6.5 g/dL (ref 6.0–8.5)

## 2018-11-18 ENCOUNTER — Other Ambulatory Visit: Payer: Self-pay | Admitting: Cardiology

## 2018-11-19 NOTE — Telephone Encounter (Signed)
Pt last saw Dr Curt Bears 09/30/18, last labs 07/22/18 Creat 0.9 at [place], age 85, weight 67.1kg, based on specified criteria pt is on appropriate dosage of Eliquis 5mg  BID.  Will refill rx.

## 2018-11-22 DIAGNOSIS — S92354D Nondisplaced fracture of fifth metatarsal bone, right foot, subsequent encounter for fracture with routine healing: Secondary | ICD-10-CM | POA: Diagnosis not present

## 2018-12-02 ENCOUNTER — Other Ambulatory Visit: Payer: Self-pay | Admitting: Cardiology

## 2019-02-21 ENCOUNTER — Other Ambulatory Visit: Payer: Self-pay | Admitting: Cardiology

## 2019-02-21 MED ORDER — AMIODARONE HCL 200 MG PO TABS: 200.0000 mg | ORAL_TABLET | Freq: Every day | ORAL | 1 refills | Status: DC

## 2019-02-21 NOTE — Telephone Encounter (Signed)
New Message    *STAT* If patient is at the pharmacy, call can be transferred to refill team.   1. Which medications need to be refilled? (please list name of each medication and dose if known) amiodarone (PACERONE) 200 MG tablet   2. Which pharmacy/location (including street and city if local pharmacy) is medication to be sent to? [facility] [phone]  3. Do they need a 30 day or 90 day supply? 30   A new rx needs to be sent patient has switch pharmacy

## 2019-02-21 NOTE — Telephone Encounter (Signed)
Pt's medication was sent to pt's pharmacy as requested. Confirmation received.  °

## 2019-03-08 DIAGNOSIS — M810 Age-related osteoporosis without current pathological fracture: Secondary | ICD-10-CM | POA: Diagnosis not present

## 2019-03-09 DIAGNOSIS — G5602 Carpal tunnel syndrome, left upper limb: Secondary | ICD-10-CM | POA: Diagnosis not present

## 2019-03-09 DIAGNOSIS — G5621 Lesion of ulnar nerve, right upper limb: Secondary | ICD-10-CM | POA: Diagnosis not present

## 2019-03-12 ENCOUNTER — Other Ambulatory Visit: Payer: Self-pay | Admitting: Cardiology

## 2019-03-14 NOTE — Telephone Encounter (Signed)
Pt is an 84yof requesting eliquis mg. Pt has a wt of 67.1kg, scr of 0.9(07/22/18), lov w/camnitz 09/30/18 will grant four month refill as pt is due for a cardiology appt in October and one hasn't been scheduled yet.

## 2019-03-18 DIAGNOSIS — I480 Paroxysmal atrial fibrillation: Secondary | ICD-10-CM | POA: Diagnosis not present

## 2019-03-18 DIAGNOSIS — Z79899 Other long term (current) drug therapy: Secondary | ICD-10-CM | POA: Diagnosis not present

## 2019-03-18 DIAGNOSIS — E039 Hypothyroidism, unspecified: Secondary | ICD-10-CM | POA: Diagnosis not present

## 2019-03-18 DIAGNOSIS — G3184 Mild cognitive impairment, so stated: Secondary | ICD-10-CM | POA: Diagnosis not present

## 2019-04-04 DIAGNOSIS — Z79899 Other long term (current) drug therapy: Secondary | ICD-10-CM | POA: Diagnosis not present

## 2019-04-04 DIAGNOSIS — E039 Hypothyroidism, unspecified: Secondary | ICD-10-CM | POA: Diagnosis not present

## 2019-04-06 ENCOUNTER — Telehealth: Payer: Self-pay | Admitting: Cardiology

## 2019-04-06 ENCOUNTER — Other Ambulatory Visit: Payer: Self-pay | Admitting: Cardiology

## 2019-04-06 DIAGNOSIS — I480 Paroxysmal atrial fibrillation: Secondary | ICD-10-CM | POA: Diagnosis not present

## 2019-04-06 DIAGNOSIS — E039 Hypothyroidism, unspecified: Secondary | ICD-10-CM | POA: Diagnosis not present

## 2019-04-06 DIAGNOSIS — M810 Age-related osteoporosis without current pathological fracture: Secondary | ICD-10-CM | POA: Diagnosis not present

## 2019-04-06 DIAGNOSIS — H35033 Hypertensive retinopathy, bilateral: Secondary | ICD-10-CM | POA: Diagnosis not present

## 2019-04-06 NOTE — Telephone Encounter (Signed)
°*  STAT* If patient is at the pharmacy, call can be transferred to refill team.   1. Which medications need to be refilled? (please list name of each medication and dose if known)  Amiodarone and Bisoprolol  2. Which pharmacy/location (including street and city if local pharmacy) is medication to be sent to? [facility]- Fax is [phone]  3. Do they need a 30 day or 90 day supply? 90 and refills

## 2019-04-06 NOTE — Telephone Encounter (Signed)
°*  STAT* If patient is at the pharmacy, call can be transferred to refill team.   1. Which medications need to be refilled? (please list name of each medication and dose if known) Bisoprolol and Amiodarone  2. Which pharmacy/location (including street and city if local pharmacy) is medication to be sent to? [facility]- Fax is [phone]  3. Do they need a 30 day or 90 day supply? 90 and refills

## 2019-04-07 MED ORDER — AMIODARONE HCL 200 MG PO TABS: 200.0000 mg | ORAL_TABLET | Freq: Every day | ORAL | 1 refills | Status: DC

## 2019-04-07 MED ORDER — BISOPROLOL FUMARATE 5 MG PO TABS: 5.0000 mg | ORAL_TABLET | Freq: Every day | ORAL | 1 refills | Status: DC

## 2019-04-15 DIAGNOSIS — R0789 Other chest pain: Secondary | ICD-10-CM | POA: Diagnosis not present

## 2019-04-15 DIAGNOSIS — Z1231 Encounter for screening mammogram for malignant neoplasm of breast: Secondary | ICD-10-CM | POA: Diagnosis not present

## 2019-04-15 DIAGNOSIS — Z20828 Contact with and (suspected) exposure to other viral communicable diseases: Secondary | ICD-10-CM | POA: Diagnosis not present

## 2019-04-16 ENCOUNTER — Encounter (HOSPITAL_COMMUNITY): Payer: Self-pay | Admitting: Emergency Medicine

## 2019-04-16 ENCOUNTER — Other Ambulatory Visit: Payer: Self-pay

## 2019-04-16 ENCOUNTER — Observation Stay (HOSPITAL_COMMUNITY)
Admission: EM | Admit: 2019-04-16 | Discharge: 2019-04-16 | Disposition: A | Discharge status: Home / Self Care | Payer: Medicare Other | Attending: Internal Medicine | Admitting: Internal Medicine

## 2019-04-16 ENCOUNTER — Emergency Department (HOSPITAL_COMMUNITY): Payer: Medicare Other

## 2019-04-16 ENCOUNTER — Observation Stay (HOSPITAL_BASED_OUTPATIENT_CLINIC_OR_DEPARTMENT_OTHER): Payer: Medicare Other

## 2019-04-16 DIAGNOSIS — K219 Gastro-esophageal reflux disease without esophagitis: Secondary | ICD-10-CM | POA: Diagnosis not present

## 2019-04-16 DIAGNOSIS — Z880 Allergy status to penicillin: Secondary | ICD-10-CM | POA: Insufficient documentation

## 2019-04-16 DIAGNOSIS — I4819 Other persistent atrial fibrillation: Secondary | ICD-10-CM

## 2019-04-16 DIAGNOSIS — Z1159 Encounter for screening for other viral diseases: Secondary | ICD-10-CM | POA: Insufficient documentation

## 2019-04-16 DIAGNOSIS — I4891 Unspecified atrial fibrillation: Secondary | ICD-10-CM | POA: Diagnosis present

## 2019-04-16 DIAGNOSIS — Z79890 Hormone replacement therapy (postmenopausal): Secondary | ICD-10-CM | POA: Insufficient documentation

## 2019-04-16 DIAGNOSIS — I110 Hypertensive heart disease with heart failure: Secondary | ICD-10-CM | POA: Diagnosis not present

## 2019-04-16 DIAGNOSIS — R0789 Other chest pain: Secondary | ICD-10-CM | POA: Diagnosis present

## 2019-04-16 DIAGNOSIS — Z79899 Other long term (current) drug therapy: Secondary | ICD-10-CM | POA: Diagnosis not present

## 2019-04-16 DIAGNOSIS — I361 Nonrheumatic tricuspid (valve) insufficiency: Secondary | ICD-10-CM | POA: Diagnosis not present

## 2019-04-16 DIAGNOSIS — Z885 Allergy status to narcotic agent status: Secondary | ICD-10-CM | POA: Diagnosis not present

## 2019-04-16 DIAGNOSIS — Z882 Allergy status to sulfonamides status: Secondary | ICD-10-CM | POA: Diagnosis not present

## 2019-04-16 DIAGNOSIS — R079 Chest pain, unspecified: Secondary | ICD-10-CM | POA: Diagnosis not present

## 2019-04-16 DIAGNOSIS — F418 Other specified anxiety disorders: Secondary | ICD-10-CM | POA: Insufficient documentation

## 2019-04-16 DIAGNOSIS — E785 Hyperlipidemia, unspecified: Secondary | ICD-10-CM | POA: Diagnosis not present

## 2019-04-16 DIAGNOSIS — K589 Irritable bowel syndrome without diarrhea: Secondary | ICD-10-CM | POA: Diagnosis not present

## 2019-04-16 DIAGNOSIS — I480 Paroxysmal atrial fibrillation: Secondary | ICD-10-CM | POA: Diagnosis not present

## 2019-04-16 DIAGNOSIS — I5022 Chronic systolic (congestive) heart failure: Secondary | ICD-10-CM | POA: Diagnosis not present

## 2019-04-16 DIAGNOSIS — Z20828 Contact with and (suspected) exposure to other viral communicable diseases: Secondary | ICD-10-CM | POA: Diagnosis not present

## 2019-04-16 DIAGNOSIS — N289 Disorder of kidney and ureter, unspecified: Secondary | ICD-10-CM

## 2019-04-16 DIAGNOSIS — Z7901 Long term (current) use of anticoagulants: Secondary | ICD-10-CM | POA: Diagnosis not present

## 2019-04-16 DIAGNOSIS — G4700 Insomnia, unspecified: Secondary | ICD-10-CM

## 2019-04-16 DIAGNOSIS — I10 Essential (primary) hypertension: Secondary | ICD-10-CM | POA: Diagnosis present

## 2019-04-16 LAB — LIPID PANEL
Cholesterol: 161 mg/dL (ref 0–200)
HDL: 53 mg/dL (ref >40)
LDL Cholesterol: 84 mg/dL (ref 0–99)
Total CHOL/HDL Ratio: 3 RATIO
Triglycerides: 120 mg/dL (ref <150)
VLDL: 24 mg/dL (ref 0–40)

## 2019-04-16 LAB — CBC
HCT: 38.8 % (ref 36.0–46.0)
Hemoglobin: 12.4 g/dL (ref 12.0–15.0)
MCH: 30 pg (ref 26.0–34.0)
MCHC: 32 g/dL (ref 30.0–36.0)
MCV: 93.7 fL (ref 80.0–100.0)
Platelets: 196 10*3/uL (ref 150–400)
RBC: 4.14 MIL/uL (ref 3.87–5.11)
RDW: 14.1 % (ref 11.5–15.5)
WBC: 5.6 10*3/uL (ref 4.0–10.5)
nRBC: 0 % (ref 0.0–0.2)

## 2019-04-16 LAB — I-STAT BETA HCG BLOOD, ED (MC, WL, AP ONLY): I-stat hCG, quantitative: <5 m[IU]/mL (ref <5)

## 2019-04-16 LAB — ECHOCARDIOGRAM COMPLETE
Height: 65 in
Weight: 2355.2 oz

## 2019-04-16 LAB — BASIC METABOLIC PANEL
Anion gap: 11 (ref 5–15)
BUN: 22 mg/dL (ref 8–23)
CO2: 24 mmol/L (ref 22–32)
Calcium: 9.3 mg/dL (ref 8.9–10.3)
Chloride: 101 mmol/L (ref 98–111)
Creatinine, Ser: 1.04 mg/dL — ABNORMAL HIGH (ref 0.44–1.00)
GFR calc Af Amer: 58 mL/min — ABNORMAL LOW (ref >60)
GFR calc non Af Amer: 50 mL/min — ABNORMAL LOW (ref >60)
Glucose, Bld: 108 mg/dL — ABNORMAL HIGH (ref 70–99)
Potassium: 4 mmol/L (ref 3.5–5.1)
Sodium: 136 mmol/L (ref 135–145)

## 2019-04-16 LAB — TROPONIN I (HIGH SENSITIVITY)
Troponin I (High Sensitivity): 3 ng/L (ref <18)
Troponin I (High Sensitivity): <2 ng/L (ref <18)

## 2019-04-16 LAB — SARS CORONAVIRUS 2 BY RT PCR (HOSPITAL ORDER, PERFORMED IN ~~LOC~~ HOSPITAL LAB): SARS Coronavirus 2: NEGATIVE

## 2019-04-16 MED ORDER — BISOPROLOL FUMARATE 5 MG PO TABS: 5.0000 mg | ORAL_TABLET | Freq: Every day | ORAL | Status: DC

## 2019-04-16 MED ORDER — ALUM & MAG HYDROXIDE-SIMETH 200-200-20 MG/5ML PO SUSP: 30.0000 mL | Freq: Four times a day (QID) | ORAL | Status: DC | PRN

## 2019-04-16 MED ORDER — HYDROXYZINE HCL 10 MG PO TABS: 10.0000 mg | ORAL_TABLET | Freq: Every evening | ORAL | Status: DC | PRN

## 2019-04-16 MED ORDER — ASPIRIN 81 MG PO CHEW
324.0000 mg | CHEWABLE_TABLET | Freq: Once | ORAL | Status: AC
  Administered 2019-04-16: 324 mg via ORAL
  Filled 2019-04-16: qty 4

## 2019-04-16 MED ORDER — NITROGLYCERIN 0.4 MG SL SUBL: 0.4000 mg | SUBLINGUAL_TABLET | SUBLINGUAL | Status: DC | PRN

## 2019-04-16 MED ORDER — AMIODARONE HCL 200 MG PO TABS: 200.0000 mg | ORAL_TABLET | Freq: Every day | ORAL | Status: DC

## 2019-04-16 MED ORDER — SODIUM CHLORIDE 0.9 % IV SOLN
INTRAVENOUS | Status: DC
  Administered 2019-04-16: 12:00:00 via INTRAVENOUS

## 2019-04-16 MED ORDER — ONDANSETRON HCL 4 MG/2ML IJ SOLN: 4.0000 mg | Freq: Four times a day (QID) | INTRAMUSCULAR | Status: DC | PRN

## 2019-04-16 MED ORDER — EZETIMIBE 10 MG PO TABS
10.0000 mg | ORAL_TABLET | Freq: Every day | ORAL | Status: DC
  Administered 2019-04-16: 10 mg via ORAL
  Filled 2019-04-16: qty 1

## 2019-04-16 MED ORDER — SODIUM CHLORIDE 0.9% FLUSH
3.0000 mL | Freq: Once | INTRAVENOUS | Status: AC
  Administered 2019-04-16: 3 mL via INTRAVENOUS

## 2019-04-16 MED ORDER — LEVOTHYROXINE SODIUM 25 MCG PO TABS
25.0000 ug | ORAL_TABLET | Freq: Every day | ORAL | Status: DC
  Administered 2019-04-16: 25 ug via ORAL
  Filled 2019-04-16: qty 1

## 2019-04-16 MED ORDER — EZETIMIBE 10 MG PO TABS: ORAL_TABLET | ORAL | 6 refills | Status: AC

## 2019-04-16 MED ORDER — NORTRIPTYLINE HCL 25 MG PO CAPS
50.0000 mg | ORAL_CAPSULE | Freq: Every day | ORAL | Status: DC
  Filled 2019-04-16: qty 2

## 2019-04-16 MED ORDER — LIDOCAINE VISCOUS HCL 2 % MT SOLN: 15.0000 mL | Freq: Four times a day (QID) | OROMUCOSAL | Status: DC | PRN

## 2019-04-16 MED ORDER — ACETAMINOPHEN 325 MG PO TABS: 650.0000 mg | ORAL_TABLET | ORAL | Status: DC | PRN

## 2019-04-16 MED ORDER — MORPHINE SULFATE (PF) 2 MG/ML IV SOLN: 2.0000 mg | INTRAVENOUS | Status: DC | PRN

## 2019-04-16 MED ORDER — BISOPROLOL FUMARATE 5 MG PO TABS
5.0000 mg | ORAL_TABLET | Freq: Every day | ORAL | Status: DC
  Administered 2019-04-16: 5 mg via ORAL
  Filled 2019-04-16: qty 1

## 2019-04-16 MED ORDER — ATORVASTATIN CALCIUM 10 MG PO TABS
10.0000 mg | ORAL_TABLET | Freq: Every day | ORAL | Status: DC
  Administered 2019-04-16: 10 mg via ORAL
  Filled 2019-04-16: qty 1

## 2019-04-16 MED ORDER — LEVOTHYROXINE SODIUM 25 MCG PO TABS: 25.0000 ug | ORAL_TABLET | Freq: Every day | ORAL | 5 refills | Status: DC

## 2019-04-16 MED ORDER — APIXABAN 5 MG PO TABS
5.0000 mg | ORAL_TABLET | Freq: Two times a day (BID) | ORAL | Status: DC
  Administered 2019-04-16: 5 mg via ORAL
  Filled 2019-04-16: qty 1

## 2019-04-16 NOTE — Discharge Instructions (Addendum)
Chest Wall Pain Chest wall pain is pain in or around the bones and muscles of your chest. Chest wall pain may be caused by:  An injury.  Coughing a lot.  Using your chest and arm muscles too much. Sometimes, the cause may not be known. This pain may take a few weeks or longer to get better. Follow these instructions at home: Managing pain, stiffness, and swelling If told, put ice on the painful area:  Put ice in a plastic bag.  Place a towel between your skin and the bag.  Leave the ice on for 20 minutes, 2-3 times a day.  Activity  Rest as told by your doctor.  Avoid doing things that cause pain. This includes lifting heavy items.  Ask your doctor what activities are safe for you. General instructions   Take over-the-counter and prescription medicines only as told by your doctor.  Do not use any products that contain nicotine or tobacco, such as cigarettes, e-cigarettes, and chewing tobacco. If you need help quitting, ask your doctor.  Keep all follow-up visits as told by your doctor. This is important. Contact a doctor if:  You have a fever.  Your chest pain gets worse.  You have new symptoms. Get help right away if:  You feel sick to your stomach (nauseous) or you throw up (vomit).  You feel sweaty or light-headed.  You have a cough with mucus from your lungs (sputum) or you cough up blood.  You are short of breath. These symptoms may be an emergency. Do not wait to see if the symptoms will go away. Get medical help right away. Call your local emergency services (911 in the [place] Do not drive yourself to the hospital. Summary  Chest wall pain is pain in or around the bones and muscles of your chest.  It may be treated with ice, rest, and medicines. Your condition may also get better if you avoid doing things that cause pain.  Contact a doctor if you have a fever, chest pain that gets worse, or new symptoms.  Get help right away if you feel  light-headed or you get short of breath. These symptoms may be an emergency. This information is not intended to replace advice given to you by your health care provider. Make sure you discuss any questions you have with your health care provider. Document Released: 02/25/2008 Document Revised: 03/11/2018 Document Reviewed: 03/11/2018 Elsevier Patient Education  2020 [place] on my medicine - ELIQUIS (apixaban)  This medication education was reviewed with me or my healthcare representative as part of my discharge preparation.    Why was Eliquis prescribed for you? Eliquis was prescribed for you to reduce the risk of a blood clot forming that can cause a stroke if you have a medical condition called atrial fibrillation (a type of irregular heartbeat).  What do You need to know about Eliquis ? Take your Eliquis TWICE DAILY - one tablet in the morning and one tablet in the evening with or without food. If you have difficulty swallowing the tablet whole please discuss with your pharmacist how to take the medication safely.  Take Eliquis exactly as prescribed by your doctor and DO NOT stop taking Eliquis without talking to the doctor who prescribed the medication.  Stopping may increase your risk of developing a stroke.  Refill your prescription before you run out.  After discharge, you should have regular check-up appointments with your healthcare provider that is prescribing your Eliquis.  In the future your dose may need to be changed if your kidney function or weight changes by a significant amount or as you get older.  What do you do if you miss a dose? If you miss a dose, take it as soon as you remember on the same day and resume taking twice daily.  Do not take more than one dose of ELIQUIS at the same time to make up a missed dose.  Important Safety Information A possible side effect of Eliquis is bleeding. You should call your healthcare provider right away if you  experience any of the following: ? Bleeding from an injury or your nose that does not stop. ? Unusual colored urine (red or dark brown) or unusual colored stools (red or black). ? Unusual bruising for unknown reasons. ? A serious fall or if you hit your head (even if there is no bleeding).  Some medicines may interact with Eliquis and might increase your risk of bleeding or clotting while on Eliquis. To help avoid this, consult your healthcare provider or pharmacist prior to using any new prescription or non-prescription medications, including herbals, vitamins, non-steroidal anti-inflammatory drugs (NSAIDs) and supplements.  This website has more information on Eliquis (apixaban): http://www.eliquis.com/eliquis/home

## 2019-04-16 NOTE — ED Provider Notes (Signed)
TIME SEEN: 6:20 AM  CHIEF COMPLAINT: Chest pain  HPI: Patient is a 85 year old female with history of hypertension, hyperlipidemia, atrial fibrillation on Eliquis who presents to the emergency department with chest pain that started last night.  Describes it as a left-sided pressure that radiates up her jaw and down her left arm.  Came on suddenly while at rest.  No aggravating or alleviating factors.  Pain lasted several hours and then resolved.  Associated with diaphoresis.  No shortness of breath, nausea, vomiting, dizziness.  Denies having recent stress test or previous cardiac catheterization.  Her cardiologist is Dr. Curt Bears.  ROS: See HPI Constitutional: no fever  Eyes: no drainage  ENT: no runny nose   Cardiovascular:  chest pain  Resp: no SOB  GI: no vomiting GU: no dysuria Integumentary: no rash  Allergy: no hives  Musculoskeletal: no leg swelling  Neurological: no slurred speech ROS otherwise negative  PAST MEDICAL HISTORY/PAST SURGICAL HISTORY:  Past Medical History:  Diagnosis Date  . Atrial fibrillation and flutter ([place])    a. s/p PVI ablation in 07/2015 with continue PAF  b. s/p repeat ablation on 08/15/16. continued on Flecainide 100mg  BID  . Carotid bruit    Right  . Chronic back pain   . Depression with anxiety 03/29/2013  . Diverticulosis   . Fibromyalgia   . Foot drop, right   . GERD (gastroesophageal reflux disease)   . Hemorrhoids   . HTN (hypertension)   . Hyperlipidemia   . IBS (irritable bowel syndrome)   . Memory loss 02/11/2015  . Onychomycosis 07/03/2013  . Osteoporosis   . Sigmoid colon ulcer 03/23/2012    MEDICATIONS:  Prior to Admission medications   Medication Sig Start Date End Date Taking? Authorizing Provider  amiodarone (PACERONE) 200 MG tablet Take 1 tablet (200 mg total) by mouth daily. 04/07/19   Camnitz, Will Hassell Done, MD  atorvastatin (LIPITOR) 10 MG tablet Take 10 mg by mouth daily.    [provider]  bisoprolol (ZEBETA) 5 MG  tablet Take 1 tablet (5 mg total) by mouth daily. 04/07/19   Camnitz, Ocie Doyne, MD  Calcium Carb-Cholecalciferol 600-200 MG-UNIT TABS Take 1 tablet by mouth daily.    [provider]  clobetasol (TEMOVATE) 0.05 % GEL APPLY TO THE AFFECTED AREA OF SKIN ON ARMS TWICE DAILY 07/08/18   [provider]  denosumab (PROLIA) 60 MG/ML SOSY injection Inject 60 mg into the skin every 6 (six) months.    [provider]  ELIQUIS 5 MG TABS tablet Take 1 tablet by mouth twice daily. 03/14/19   Camnitz, Will Hassell Done, MD  esomeprazole (NEXIUM) 40 MG capsule TAKE ONE CAPSULE BY MOUTH EVERY DAY AT 12 NOON 05/17/18   Camnitz, Will Hassell Done, MD  eszopiclone (LUNESTA) 2 MG TABS tablet Take 1 tablet (2 mg total) by mouth at bedtime as needed for sleep. Take immediately before bedtime Patient taking differently: Take 2 mg by mouth at bedtime. Take immediately before bedtime 02/26/15   Mosie Lukes, MD  gabapentin (NEURONTIN) 600 MG tablet TAKE 1 TABLET(600 MG) BY MOUTH TWICE DAILY 08/13/15   Darlin Coco, MD  hydrOXYzine (ATARAX/VISTARIL) 10 MG tablet Take 10-20 mg by mouth at bedtime as needed for itching (sleep).    [provider]  levothyroxine (SYNTHROID, LEVOTHROID) 25 MCG tablet Take 25 mcg by mouth daily. 07/22/18   [provider]  MEGARED OMEGA-3 KRILL OIL 500 MG CAPS Take 500 mg by mouth daily.     [provider]  Melatonin 10 MG CAPS Take 10 mg by mouth at bedtime.    [provider]  Multiple Vitamins-Minerals (CENTRUM SILVER ULTRA WOMENS) TABS Take 1 tablet by mouth daily.    [provider]  nortriptyline (PAMELOR) 25 MG capsule Take 50 mg by mouth at bedtime.     [provider]  Polyethyl Glycol-Propyl Glycol (SYSTANE OP) Place 1 drop into both eyes daily as needed (for dry eyes).    [provider]  ZETIA 10 MG tablet TAKE 1 TABLET BY MOUTH EVERY DAY Patient taking differently: TAKE 10 MG BY MOUTH AT BEDTIME  04/06/15   Mosie Lukes, MD    ALLERGIES:  Allergies  Allergen Reactions  . Cefuroxime Axetil Anaphylaxis  . Macrodantin Anaphylaxis  . Alendronate Sodium Other (See Comments)    Severe chest pain similar to heart attack   . Codeine Nausea And Vomiting  . Demerol [Meperidine] Other (See Comments)    hallucinations  . Meperidine Hcl Nausea And Vomiting  . Other Other (See Comments)    Hismanal and Maprobamate portobello mushrooms - diarrhea  . Penicillins Swelling and Other (See Comments)    Has patient had a PCN reaction causing immediate rash, facial/tongue/throat swelling, SOB or lightheadedness with hypotension: Yes Has patient had a PCN reaction causing severe rash involving mucus membranes or skin necrosis: No Has patient had a PCN reaction that required hospitalization: Yes Has patient had a PCN reaction occurring within the last 10 years: Yes If all of the above answers are "NO", then may proceed with Cephalosporin use.   . Sulfonamide Derivatives Hives, Itching and Swelling    SOCIAL HISTORY:  Social History   Tobacco Use  . Smoking status: Former Smoker    Packs/day: 0.25    Years: 4.00    Pack years: 1.00    Types: Cigarettes  . Smokeless tobacco: Never Used  . Tobacco comment: quit 20+yrs ago  Substance Use Topics  . Alcohol use: Yes    Alcohol/week: 0.0 standard drinks    Comment: WINE WITH DINNER. 2 GLASSES RED WINE    FAMILY HISTORY: Family History  Problem Relation Age of Onset  . Bipolar disorder Mother   . Stomach cancer Mother   . Mental illness Mother   . Anxiety disorder Mother   . Stroke Father   . Colon cancer Father   . HIV/AIDS Son   . Stomach cancer Maternal Grandfather   . Stomach cancer Paternal Grandmother   . Healthy Daughter   . Heart disease Maternal Grandmother   . Throat cancer Maternal Uncle        larynx  . Anesthesia problems Neg Hx   . Diabetes Neg Hx     EXAM: BP 137/68   Pulse (!) 52   Temp 97.8 F (36.6 C)  (Oral)   Resp (!) 21   Ht 5\' 4"  (1.626 m)   Wt 66.7 kg   SpO2 97%   BMI 25.23 kg/m  CONSTITUTIONAL: Alert and oriented and responds appropriately to questions. Well-appearing; well-nourished, elderly but appears much younger than stated age HEAD: Normocephalic EYES: Conjunctivae clear, pupils appear equal, EOMI ENT: normal nose; moist mucous membranes NECK: Supple, no meningismus, no nuchal rigidity, no LAD  CARD: RRR; S1 and S2 appreciated; no murmurs, no clicks, no rubs, no gallops RESP: Normal chest excursion without splinting or tachypnea; breath sounds clear and equal bilaterally; no wheezes, no rhonchi, no rales, no hypoxia or respiratory distress, speaking full sentences ABD/GI: Normal bowel  sounds; non-distended; soft, non-tender, no rebound, no guarding, no peritoneal signs, no hepatosplenomegaly BACK:  The back appears normal and is non-tender to palpation, there is no CVA tenderness EXT: Normal ROM in all joints; non-tender to palpation; no edema; normal capillary refill; no cyanosis, no calf tenderness or swelling    SKIN: Normal color for age and race; warm; no rash NEURO: Moves all extremities equally PSYCH: The patient's mood and manner are appropriate. Grooming and personal hygiene are appropriate.  MEDICAL DECISION MAKING: Patient here with chest pain.  She has multiple risk factors for ACS.  Her heart score is 5.  Her troponin is negative.  Her chest x-ray is clear.  She is asymptomatic currently.  Will give aspirin.  Recommended admission.  Will update her daughter.  ED PROGRESS: 6:31 AM Discussed patient's case with hospitalist, Dr. Blaine Hamper.  I have recommended admission and patient (and family if present) agree with this plan. Admitting physician will place admission orders.   I reviewed all nursing notes, vitals, pertinent previous records, EKGs, lab and urine results, imaging (as available).       EKG Interpretation  Date/Time:  Saturday April 16 2019 00:23:56  EDT Ventricular Rate:  52 PR Interval:  246 QRS Duration: 98 QT Interval:  522 QTC Calculation: 485 R Axis:   -28 Text Interpretation:  Sinus bradycardia with 1st degree A-V block Minimal voltage criteria for LVH, may be normal variant Possible Anterior infarct , age undetermined Abnormal ECG No significant change since last tracing Confirmed by Ward, Cyril Mourning [phone]) on 04/16/2019 6:13:40 AM         Ward, Delice Bison, DO 04/16/19 [id number]

## 2019-04-16 NOTE — ED Triage Notes (Signed)
Patient with chest pain and shortness of breath, started about 2 hours ago, center of her chest and radiates around her breasts to her back and up into neck.  Patient states that it is a pressure.  Denies any nausea or vomiting.

## 2019-04-16 NOTE — H&P (Addendum)
History and Physical    Chelsea Hancock OIZ:[id number] DOB: 04/08/1934 DOA: 04/16/2019  Referring MD/NP/PA: Ivor Costa, MD PCP: Lajean Manes, MD  Patient coming from: home  Chief Complaint: Chest pain  I have personally briefly reviewed patient's old medical records in [place]   HPI: Chelsea Hancock is a 85 y.o. female with medical history significant of atrial fibrillation s/p cardioversion in 09/2017, on chronic anticoagulation, ASD, hypertension, hyperlipidemia, GERD, and osteoporosis; who presented with complaints of left-sided chest pain starting last night.  She states that the pain was sharp and stabbing.  She chronically has lower extremity swelling.  Denies having any associated symptoms of nausea, vomiting, diaphoresis, abdominal pain, calf pain, lightheadedness, or shortness of breath.  She is followed by Dr. Curt Bears in the outpatient setting and had negative CT coronary study performed in 07/2016 with a coronary artery calcium score of 11.8.  She does not know what made the pain go away or how long that it took to resolve.   ED Course: Upon admission into the emergency department patient was noted to be afebrile, pulse 50-52, respirations 17-21, and all other vital signs maintained.  Labs revealed  high-sensitivity troponin negative x2, BUN 22, and creatinine 1.04.  EKG revealed sinus bradycardia at 52 bpm with first-degree AV heart block. Covid-19 negative. TRH called to admit.  Review of Systems  Constitutional: Negative for chills and fever.  HENT: Negative for ear discharge and ear pain.   Eyes: Negative for double vision and photophobia.  Respiratory: Negative for cough and shortness of breath.   Cardiovascular: Positive for chest pain and leg swelling. Negative for orthopnea.  Gastrointestinal: Negative for nausea and vomiting.  Genitourinary: Negative for dysuria and hematuria.  Musculoskeletal: Negative for falls.  Skin: Negative for rash.   Neurological: Negative for focal weakness, loss of consciousness and weakness.  Psychiatric/Behavioral: Negative for substance abuse.    Past Medical History:  Diagnosis Date  . Atrial fibrillation and flutter ([place])    a. s/p PVI ablation in 07/2015 with continue PAF  b. s/p repeat ablation on 08/15/16. continued on Flecainide 100mg  BID  . Carotid bruit    Right  . Chronic back pain   . Depression with anxiety 03/29/2013  . Diverticulosis   . Fibromyalgia   . Foot drop, right   . GERD (gastroesophageal reflux disease)   . Hemorrhoids   . HTN (hypertension)   . Hyperlipidemia   . IBS (irritable bowel syndrome)   . Memory loss 02/11/2015  . Onychomycosis 07/03/2013  . Osteoporosis   . Sigmoid colon ulcer 03/23/2012    Past Surgical History:  Procedure Laterality Date  . ABDOMINAL HYSTERECTOMY  1981  . BACK SURGERY  1997   reconstructive lower back surgery  . CARDIOVERSION N/A 07/01/2016   Procedure: CARDIOVERSION;  Surgeon: Thayer Headings, MD;  Location: [place];  Service: Cardiovascular;  Laterality: N/A;  . CARDIOVERSION N/A 10/22/2017   Procedure: CARDIOVERSION;  Surgeon: Pixie Casino, MD;  Location: [facility] ENDOSCOPY;  Service: Cardiovascular;  Laterality: N/A;  . CARPAL TUNNEL RELEASE Left 2012   ulnar nerve release at elbow  . CERVICAL FUSION  2003, 2005, 2013  . COLONOSCOPY    . ELECTROPHYSIOLOGIC STUDY N/A 08/10/2015   Procedure: Afib;  Surgeon: Will Meredith Leeds, MD;  Location: [place] CV LAB;  Service: Cardiovascular;  Laterality: N/A;  . ELECTROPHYSIOLOGIC STUDY N/A 08/15/2016   Procedure: Atrial Fibrillation Ablation;  Surgeon: Will Meredith Leeds, MD;  Location: [place] CV  LAB;  Service: Cardiovascular;  Laterality: N/A;  . EYE SURGERY  2015   b/l cataracts removed  . left elbow surgery  06/03/11   cyst removed  . LUMBAR LAMINECTOMY  1960   x 2  . POSTERIOR CERVICAL FUSION/FORAMINOTOMY  08/03/2012   Procedure: POSTERIOR CERVICAL  FUSION/FORAMINOTOMY LEVEL 5;  Surgeon: [name], MD;  Location: [place] NEURO ORS;  Service: Neurosurgery;  Laterality: N/A;  Cervical four to Thoracic two Posterior cervical decompression with Facet and Pedicle screw fixation  . TEE WITHOUT CARDIOVERSION N/A 10/22/2017   Procedure: TRANSESOPHAGEAL ECHOCARDIOGRAM (TEE);  Surgeon: Pixie Casino, MD;  Location: [facility] ENDOSCOPY;  Service: Cardiovascular;  Laterality: N/A;  . TONSILLECTOMY  1943  . TOTAL HIP ARTHROPLASTY Left 2012  . TOTAL KNEE ARTHROPLASTY Right 11/20/2014   Procedure: RIGHT TOTAL KNEE ARTHROPLASTY;  Surgeon: [name], MD;  Location: [facility] ORS;  Service: Orthopedics;  Laterality: Right;  . veins stripped  1970  . WRIST SURGERY Right 1990's   cyst removed     reports that she has quit smoking. Her smoking use included cigarettes. She has a 1.00 pack-year smoking history. She has never used smokeless tobacco. She reports current alcohol use. She reports that she does not use drugs.  Allergies  Allergen Reactions  . Cefuroxime Axetil Anaphylaxis  . Macrodantin Anaphylaxis  . Alendronate Sodium Other (See Comments)    Severe chest pain similar to heart attack   . Codeine Nausea And Vomiting  . Demerol [Meperidine] Other (See Comments)    hallucinations  . Meperidine Hcl Nausea And Vomiting  . Other Other (See Comments)    Hismanal and Maprobamate portobello mushrooms - diarrhea  . Penicillins Swelling and Other (See Comments)    Has patient had a PCN reaction causing immediate rash, facial/tongue/throat swelling, SOB or lightheadedness with hypotension: Yes Has patient had a PCN reaction causing severe rash involving mucus membranes or skin necrosis: No Has patient had a PCN reaction that required hospitalization: Yes Has patient had a PCN reaction occurring within the last 10 years: Yes If all of the above answers are "NO", then may proceed with Cephalosporin use.   . Sulfonamide Derivatives Hives, Itching and Swelling     Family History  Problem Relation Age of Onset  . Bipolar disorder Mother   . Stomach cancer Mother   . Mental illness Mother   . Anxiety disorder Mother   . Stroke Father   . Colon cancer Father   . HIV/AIDS Son   . Stomach cancer Maternal Grandfather   . Stomach cancer Paternal Grandmother   . Healthy Daughter   . Heart disease Maternal Grandmother   . Throat cancer Maternal Uncle        larynx  . Anesthesia problems Neg Hx   . Diabetes Neg Hx     Prior to Admission medications   Medication Sig Start Date End Date Taking? Authorizing Provider  amiodarone (PACERONE) 200 MG tablet Take 1 tablet (200 mg total) by mouth daily. 04/07/19   Camnitz, Will Hassell Done, MD  atorvastatin (LIPITOR) 10 MG tablet Take 10 mg by mouth daily.    [provider]  bisoprolol (ZEBETA) 5 MG tablet Take 1 tablet (5 mg total) by mouth daily. 04/07/19   Camnitz, Ocie Doyne, MD  Calcium Carb-Cholecalciferol 600-200 MG-UNIT TABS Take 1 tablet by mouth daily.    [provider]  clobetasol (TEMOVATE) 0.05 % GEL APPLY TO THE AFFECTED AREA OF SKIN ON ARMS TWICE DAILY 07/08/18   [provider]  denosumab (PROLIA) 60 MG/ML SOSY injection Inject 60 mg into the skin every 6 (six) months.    [provider]  ELIQUIS 5 MG TABS tablet Take 1 tablet by mouth twice daily. 03/14/19   Camnitz, Will Hassell Done, MD  esomeprazole (NEXIUM) 40 MG capsule TAKE ONE CAPSULE BY MOUTH EVERY DAY AT 12 NOON 05/17/18   Camnitz, Will Hassell Done, MD  eszopiclone (LUNESTA) 2 MG TABS tablet Take 1 tablet (2 mg total) by mouth at bedtime as needed for sleep. Take immediately before bedtime Patient taking differently: Take 2 mg by mouth at bedtime. Take immediately before bedtime 02/26/15   Mosie Lukes, MD  gabapentin (NEURONTIN) 600 MG tablet TAKE 1 TABLET(600 MG) BY MOUTH TWICE DAILY 08/13/15   Darlin Coco, MD  hydrOXYzine (ATARAX/VISTARIL) 10 MG tablet Take 10-20 mg by mouth at bedtime as needed for  itching (sleep).    [provider]  levothyroxine (SYNTHROID, LEVOTHROID) 25 MCG tablet Take 25 mcg by mouth daily. 07/22/18   [provider]  MEGARED OMEGA-3 KRILL OIL 500 MG CAPS Take 500 mg by mouth daily.     [provider]  Melatonin 10 MG CAPS Take 10 mg by mouth at bedtime.    [provider]  Multiple Vitamins-Minerals (CENTRUM SILVER ULTRA WOMENS) TABS Take 1 tablet by mouth daily.    [provider]  nortriptyline (PAMELOR) 25 MG capsule Take 50 mg by mouth at bedtime.     [provider]  Polyethyl Glycol-Propyl Glycol (SYSTANE OP) Place 1 drop into both eyes daily as needed (for dry eyes).    [provider]  ZETIA 10 MG tablet TAKE 1 TABLET BY MOUTH EVERY DAY Patient taking differently: TAKE 10 MG BY MOUTH AT BEDTIME 04/06/15   Mosie Lukes, MD    Physical Exam:  Constitutional: Elderly female currently in NAD, calm, comfortable Vitals:   04/16/19 0239 04/16/19 0558 04/16/19 0615 04/16/19 0645  BP: (!) 113/49 137/68 (!) 141/69 134/61  Pulse: (!) 51 (!) 52  (!) 50  Resp: 19 (!) 21 18 (!) 21  Temp:      TempSrc:      SpO2: 99% 97%  98%  Weight:      Height:       Eyes: PERRL, lids and conjunctivae normal ENMT: Mucous membranes are moist. Posterior pharynx clear of any exudate or lesions. Neck: normal, supple, no masses, no thyromegaly Respiratory: clear to auscultation bilaterally, no wheezing, no crackles. Normal respiratory effort. No accessory muscle use.  Cardiovascular: Regular rate and rhythm, no murmurs / rubs / gallops.  +1 pitting lower extremity edema. 2+ pedal pulses. No carotid bruits.  Abdomen: no tenderness, no masses palpated. No hepatosplenomegaly. Bowel sounds positive.  Musculoskeletal: no clubbing / cyanosis. No joint deformity upper and lower extremities. Good ROM, no contractures. Normal muscle tone.  Skin: no rashes, lesions, ulcers. No induration Neurologic: CN 2-12 grossly intact.  Sensation intact, DTR normal. Strength 5/5 in all 4.  Psychiatric: Normal judgment and insight. Alert and oriented x 3. Normal mood.     Labs on Admission: I have personally reviewed following labs and imaging studies  CBC: Recent Labs  Lab 04/16/19 0048  WBC 5.6  HGB 12.4  HCT 38.8  MCV 93.7  PLT 688   Basic Metabolic Panel: Recent Labs  Lab 04/16/19 0048  NA 136  K 4.0  CL 101  CO2 24  GLUCOSE 108*  BUN 22  CREATININE 1.04*  CALCIUM 9.3  GFR: Estimated Creatinine Clearance: 39.2 mL/min (A) (by C-G formula based on SCr of 1.04 mg/dL (H)). Liver Function Tests: No results for input(s): AST, ALT, ALKPHOS, BILITOT, PROT, ALBUMIN in the last 168 hours. No results for input(s): LIPASE, AMYLASE in the last 168 hours. No results for input(s): AMMONIA in the last 168 hours. Coagulation Profile: No results for input(s): INR, PROTIME in the last 168 hours. Cardiac Enzymes: No results for input(s): CKTOTAL, CKMB, CKMBINDEX, TROPONINI in the last 168 hours. BNP (last 3 results) No results for input(s): PROBNP in the last 8760 hours. HbA1C: No results for input(s): HGBA1C in the last 72 hours. CBG: No results for input(s): GLUCAP in the last 168 hours. Lipid Profile: No results for input(s): CHOL, HDL, LDLCALC, TRIG, CHOLHDL, LDLDIRECT in the last 72 hours. Thyroid Function Tests: No results for input(s): TSH, T4TOTAL, FREET4, T3FREE, THYROIDAB in the last 72 hours. Anemia Panel: No results for input(s): VITAMINB12, FOLATE, FERRITIN, TIBC, IRON, RETICCTPCT in the last 72 hours. Urine analysis:    Component Value Date/Time   COLORURINE YELLOW 08/05/2015 0954   APPEARANCEUR CLOUDY (A) 08/05/2015 0954   LABSPEC 1.022 08/05/2015 0954   PHURINE 5.5 08/05/2015 0954   GLUCOSEU NEGATIVE 08/05/2015 0954   GLUCOSEU NEGATIVE 01/29/2015 1700   HGBUR TRACE (A) 08/05/2015 0954   HGBUR negative 09/20/2009 0812   BILIRUBINUR NEGATIVE 08/05/2015 0954   BILIRUBINUR 1 01/26/2015  1345   KETONESUR NEGATIVE 08/05/2015 0954   PROTEINUR NEGATIVE 08/05/2015 0954   UROBILINOGEN 0.2 08/05/2015 0954   NITRITE NEGATIVE 08/05/2015 0954   LEUKOCYTESUR NEGATIVE 08/05/2015 0954   Sepsis Labs: No results found for this or any previous visit (from the past 240 hour(s)).   Radiological Exams on Admission: Dg Chest 2 View  Result Date: 04/16/2019 CLINICAL DATA:  85 year old female with chest pain. EXAM: CHEST - 2 VIEW COMPARISON:  Chest radiograph dated 07/19/2016 and CT dated 08/06/2015 FINDINGS: There is no focal consolidation, pleural effusion, or pneumothorax. Mild chronic interstitial coarsening. The cardiac silhouette is within normal limits. No acute osseous pathology. Mild thoracolumbar scoliosis. Partially visualized cervical spine fixation hardware. IMPRESSION: No active cardiopulmonary disease. Electronically Signed   By: Anner Crete M.D.   On: 04/16/2019 01:17    EKG: Independently reviewed.  Sinus bradycardia with first-degree AV block at 52 bpm  Assessment/Plan Chest pain: Acute.  Patient reports having acute chest pain with radiation into her neck.  Troponins negative x2. CXR showed no acute cardiopulmonary disease.  Patient had a low risk coronary CT back in 11/ 2017.  Heart score = 5. -Admit to a telemetry bed -Check echocardiogram -Continue statin -Follow-up telemetry -Cardiology consulted, will follow-up for any further recommendation  Renal insufficiency: On admission patient's creatinine 1.04 with BUN 22.  Patient's baseline creatinine previously noted to be 0.8-0.9 suspect aspect of dehydration given patient has been n.p.o. -Gentle IV fluids normal saline -Consider rechecking kidney function in outpatient setting  Other persistent atrial fibrillation on chronic anticoagulation: Patient with previous history of ablation and cardioversion last performed on 10/22/2017.  Currently bradycardic with heart rates in the 40s and 50s.  Followed by Dr. Curt Bears in  outpatient setting. CHA2DS2-VASc score =4. -Continue Eliquis -Restart amiodarone when medically appropriate  Essential hypertension: Blood pressure currently stable. -Held bisoprolol due to bradycardia has persisted  Chronic systolic congestive heart failure: Patient appears relatively euvolemic at this time.  Patient with chronic lower extremity edema.  Last EF noted to be 45 to 50% with diffuse hypokinesis in 09/2017. -Strict intake  and output -Daily weight  Dyslipidemia: Total cholesterol found to be 161, HDL 53, LDL 84, triglycerides 120. -Continue Zetia and Lipitor  DVT prophylaxis: Eliquis Code Status: Full Family Communication: Daughter to be updated Disposition Plan: Possible discharge home in 1 to 2 days Consults called: Cards Admission status: Observation   Norval Morton MD [facility] Pager [phone]   If 7PM-7AM, please contact night-coverage [email or web address] Password [facility]  04/16/2019, 7:37 AM

## 2019-04-16 NOTE — Progress Notes (Signed)
  Echocardiogram 2D Echocardiogram has been performed.  Chelsea Hancock Chelsea Hancock 04/16/2019, 2:13 PM

## 2019-04-16 NOTE — Care Management (Signed)
This is a no charge note  Pending admission per Dr. Leonides Schanz  85 year old lady with past medical history of hypertension, hyperlipidemia, GERD, depression, anxiety, atrial fibrillation, who presents with chest pain, shortness of breath, diaphoresis.  Troponin negative x2.  Pending COVID-19 test.  Patient is placed on telemetry bed for observation.   Ivor Costa, MD  [facility]   If 7PM-7AM, please contact night-coverage [email or web address] Password [facility] 04/16/2019, 6:32 AM

## 2019-04-16 NOTE — ED Notes (Signed)
Report called  

## 2019-04-16 NOTE — ED Notes (Signed)
ED TO INPATIENT HANDOFF REPORT  ED Nurse Name and Phone #: Gretta Cool [phone]  S Name/Age/Gender Chelsea Hancock 85 y.o. female Room/Bed: 029C/029C  Code Status   Code Status: Prior  Home/SNF/Other Home Patient oriented to: self, place, time and situation Is this baseline? Yes   Triage Complete: Triage complete  Chief Complaint CP radiating pain down arm  Triage Note Patient with chest pain and shortness of breath, started about 2 hours ago, center of her chest and radiates around her breasts to her back and up into neck.  Patient states that it is a pressure.  Denies any nausea or vomiting.     Allergies Allergies  Allergen Reactions  . Cefuroxime Axetil Anaphylaxis  . Macrodantin Anaphylaxis  . Alendronate Sodium Other (See Comments)    Severe chest pain similar to heart attack   . Codeine Nausea And Vomiting  . Demerol [Meperidine] Other (See Comments)    hallucinations  . Meperidine Hcl Nausea And Vomiting  . Other Other (See Comments)    Hismanal and Maprobamate portobello mushrooms - diarrhea  . Penicillins Swelling and Other (See Comments)    Has patient had a PCN reaction causing immediate rash, facial/tongue/throat swelling, SOB or lightheadedness with hypotension: Yes Has patient had a PCN reaction causing severe rash involving mucus membranes or skin necrosis: No Has patient had a PCN reaction that required hospitalization: Yes Has patient had a PCN reaction occurring within the last 10 years: Yes If all of the above answers are "NO", then may proceed with Cephalosporin use.   . Sulfonamide Derivatives Hives, Itching and Swelling    Level of Care/Admitting Diagnosis ED Disposition    ED Disposition Condition [facility]: [place] [[id number]]  Level of Care: Telemetry Cardiac [103]  I expect the patient will be discharged within 24 hours: No (not a candidate for 5C-Observation unit)  Covid Evaluation:  Asymptomatic Screening Protocol (No Symptoms)  Diagnosis: Chest pain [413244]  Admitting Physician: Ivor Costa [[id number]]  Attending Physician: Ivor Costa [[id number]]  PT Class (Do Not Modify): Observation [104]  PT Acc Code (Do Not Modify): Observation [10022]       B Medical/Surgery History Past Medical History:  Diagnosis Date  . Atrial fibrillation and flutter ([place])    a. s/p PVI ablation in 07/2015 with continue PAF  b. s/p repeat ablation on 08/15/16. continued on Flecainide 100mg  BID  . Carotid bruit    Right  . Chronic back pain   . Depression with anxiety 03/29/2013  . Diverticulosis   . Fibromyalgia   . Foot drop, right   . GERD (gastroesophageal reflux disease)   . Hemorrhoids   . HTN (hypertension)   . Hyperlipidemia   . IBS (irritable bowel syndrome)   . Memory loss 02/11/2015  . Onychomycosis 07/03/2013  . Osteoporosis   . Sigmoid colon ulcer 03/23/2012   Past Surgical History:  Procedure Laterality Date  . ABDOMINAL HYSTERECTOMY  1981  . BACK SURGERY  1997   reconstructive lower back surgery  . CARDIOVERSION N/A 07/01/2016   Procedure: CARDIOVERSION;  Surgeon: Thayer Headings, MD;  Location: [place];  Service: Cardiovascular;  Laterality: N/A;  . CARDIOVERSION N/A 10/22/2017   Procedure: CARDIOVERSION;  Surgeon: Pixie Casino, MD;  Location: [facility];  Service: Cardiovascular;  Laterality: N/A;  . CARPAL TUNNEL RELEASE Left 2012   ulnar nerve release at elbow  . CERVICAL FUSION  2003, 2005, 2013  . COLONOSCOPY    .  ELECTROPHYSIOLOGIC STUDY N/A 08/10/2015   Procedure: Afib;  Surgeon: Will Meredith Leeds, MD;  Location: [place] [facility];  Service: Cardiovascular;  Laterality: N/A;  . ELECTROPHYSIOLOGIC STUDY N/A 08/15/2016   Procedure: Atrial Fibrillation Ablation;  Surgeon: Will Meredith Leeds, MD;  Location: [place] [facility];  Service: Cardiovascular;  Laterality: N/A;  . EYE SURGERY  2015   b/l cataracts removed  . left elbow surgery  06/03/11    cyst removed  . LUMBAR LAMINECTOMY  1960   x 2  . POSTERIOR CERVICAL FUSION/FORAMINOTOMY  08/03/2012   Procedure: POSTERIOR CERVICAL FUSION/FORAMINOTOMY LEVEL 5;  Surgeon: [name], MD;  Location: [facility];  Service: Neurosurgery;  Laterality: N/A;  Cervical four to Thoracic two Posterior cervical decompression with Facet and Pedicle screw fixation  . TEE WITHOUT CARDIOVERSION N/A 10/22/2017   Procedure: TRANSESOPHAGEAL ECHOCARDIOGRAM (TEE);  Surgeon: Pixie Casino, MD;  Location: [facility];  Service: Cardiovascular;  Laterality: N/A;  . TONSILLECTOMY  1943  . TOTAL HIP ARTHROPLASTY Left 2012  . TOTAL KNEE ARTHROPLASTY Right 11/20/2014   Procedure: RIGHT TOTAL KNEE ARTHROPLASTY;  Surgeon: [name], MD;  Location: [facility];  Service: Orthopedics;  Laterality: Right;  . veins stripped  1970  . WRIST SURGERY Right 1990's   cyst removed     A IV Location/Drains/Wounds Patient Lines/Drains/Airways Status   Active Line/Drains/Airways    Name:   Placement date:   Placement time:   Site:   Days:   Peripheral IV 04/16/19 Left Antecubital   04/16/19    0654    Antecubital   less than 1          Intake/Output Last 24 hours No intake or output data in the 24 hours ending 04/16/19 0704  Labs/Imaging Results for orders placed or performed during the hospital encounter of 04/16/19 (from the past 48 hour(s))  Basic metabolic panel     Status: Abnormal   Collection Time: 04/16/19 12:48 AM  Result Value Ref Range   Sodium 136 135 - 145 mmol/L   Potassium 4.0 3.5 - 5.1 mmol/L   Chloride 101 98 - 111 mmol/L   CO2 24 22 - 32 mmol/L   Glucose, Bld 108 (H) 70 - 99 mg/dL   BUN 22 8 - 23 mg/dL   Creatinine, Ser 1.04 (H) 0.44 - 1.00 mg/dL   Calcium 9.3 8.9 - 10.3 mg/dL   GFR calc non Af Amer 50 (L) >60 mL/min   GFR calc Af Amer 58 (L) >60 mL/min   Anion gap 11 5 - 15    Comment: Performed at [facility], [address] [place], [place] [address]  CBC     Status: None    Collection Time: 04/16/19 12:48 AM  Result Value Ref Range   WBC 5.6 4.0 - 10.5 K/uL   RBC 4.14 3.87 - 5.11 MIL/uL   Hemoglobin 12.4 12.0 - 15.0 g/dL   HCT 38.8 36.0 - 46.0 %   MCV 93.7 80.0 - 100.0 fL   MCH 30.0 26.0 - 34.0 pg   MCHC 32.0 30.0 - 36.0 g/dL   RDW 14.1 11.5 - 15.5 %   Platelets 196 150 - 400 K/uL   nRBC 0.0 0.0 - 0.2 %    Comment: Performed at [facility], [place] [address], [place], [place] [address]  Troponin I (High Sensitivity)     Status: None   Collection Time: 04/16/19 12:48 AM  Result Value Ref Range  Troponin I (High Sensitivity) 3 <18 ng/L    Comment: (NOTE) Elevated high sensitivity troponin I (hsTnI) values and significant  changes across serial measurements may suggest ACS but many other  chronic and acute conditions are known to elevate hsTnI results.  Refer to the "Links" section for chest pain algorithms and additional  guidance. Performed at [facility], [place] [address], [place], [place] [address]   I-Stat beta hCG blood, ED ([facility], [facility], [facility] only)     Status: None   Collection Time: 04/16/19  1:07 AM  Result Value Ref Range   I-stat hCG, quantitative <5.0 <5 mIU/mL   Comment 3            Comment:   GEST. AGE      CONC.  (mIU/mL)   <=1 WEEK        5 - 50     2 WEEKS       50 - 500     3 WEEKS       100 - 10,000     4 WEEKS     1,000 - 30,000        FEMALE AND NON-PREGNANT FEMALE:     LESS THAN 5 mIU/mL   Troponin I (High Sensitivity)     Status: None   Collection Time: 04/16/19  3:52 AM  Result Value Ref Range   Troponin I (High Sensitivity) <2 <18 ng/L    Comment: Performed at [facility], [place] [address] [place], [place] [address]   Dg Chest 2 View  Result Date: 04/16/2019 CLINICAL DATA:  85 year old female with chest pain. EXAM: CHEST - 2 VIEW COMPARISON:  Chest radiograph dated 07/19/2016 and CT dated 08/06/2015 FINDINGS: There is no focal consolidation, pleural effusion, or pneumothorax. Mild chronic interstitial  coarsening. The cardiac silhouette is within normal limits. No acute osseous pathology. Mild thoracolumbar scoliosis. Partially visualized cervical spine fixation hardware. IMPRESSION: No active cardiopulmonary disease. Electronically Signed   By: Anner Crete M.D.   On: 04/16/2019 01:17    Pending Labs Unresulted Labs (From admission, onward)    Start     Ordered   04/16/19 7782  SARS Coronavirus 2 (CEPHEID - Performed in [facility] lab), Hosp Order  (Asymptomatic Patients Labs)  Once,   STAT    Question:  Rule Out  Answer:  Yes   04/16/19 0632          Vitals/Pain Today's Vitals   04/16/19 0558 04/16/19 0558 04/16/19 0615 04/16/19 0645  BP: 137/68  (!) 141/69 134/61  Pulse: (!) 52   (!) 50  Resp: (!) 21  18 (!) 21  Temp:      TempSrc:      SpO2: 97%   98%  Weight:      Height:      PainSc:  0-No pain      Isolation Precautions No active isolations  Medications Medications  sodium chloride flush (NS) 0.9 % injection 3 mL (3 mLs Intravenous Given 04/16/19 0651)  aspirin chewable tablet 324 mg (324 mg Oral Given 04/16/19 0651)    Mobility walks Moderate fall risk   Focused Assessments Cardiac Assessment Handoff:  Cardiac Rhythm: Normal sinus rhythm Lab Results  Component Value Date   TROPONINI <0.03 07/19/2016   No results found for: DDIMER Does the Patient currently have chest pain? No     R Recommendations: See Admitting Provider Note  Report given to:   Additional Notes:  Patient does  have some mild dementia, forgetfulness, pleasant

## 2019-04-16 NOTE — Consult Note (Addendum)
Cardiology Consultation:   Patient ID: Chelsea Hancock MRN: [id number]; DOB: 03-01-1934  Admit date: 04/16/2019 Date of Consult: 04/16/2019  Primary Care Provider: Lajean Manes, MD Primary Cardiologist: Chelsea Meredith Leeds, MD  Primary Electrophysiologist:  None    Patient Profile:   Chelsea Hancock is a 85 y.o. female with a hx of paroxysmal atrial fibrillation s/p ablation x 2, on  Eliquis, hypertension, hyperlipidemia who is being seen today for the evaluation of chest pain at the request of Chelsea Hancock.  History of Present Illness:   Chelsea Hancock was last seen by Chelsea Hancock on 09/30/18. She has a history of 2 ablations for Afib/flutter and had been feeling well until 09/2017. She reverted back to Afib and underwent successful DCCV on 10/22/17. TEE did show an ASD; however, cardiac CT prior to ablation did not show evidence of ASD. She has chronic lower extremity swelling that generally occurs at the end of the Hancock. Of note, was on flecainide after ablations, now on amiodarone and bisoprolol. Is anticoagulated with eliquis for a CHADsVASC of 4.   Of note, she had a CT coronary on 08/08/16 with a Ca score of 11 placing her in the 29th percentile suggesting low risk for future cardiac events - nonobstructive coronary artery disease.   She presented to [facility] with onset of sharp left-sided chest pain last night. She woke up and felt sharp chest pain (CP did not wake her up). She does not recall what time. The chest pain did not radiate, was described as a stabbing pain; constant. The CP did subside after an unknown length of time, but then returned prompting her to call her daughter who brought her to the ER. Chest pain was rated as a 5/10 and has never occurred before. Chest pain eventually resolved, she does not know what time or what relieved her pain. She had her usual meal and diet last evening. She denies SOB, orthopnea, LE edema, N/V, and near-syncope.  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 Pt has had no further chest pain    Heart Pathway Score:  HEAR Score: 5  Past Medical History:  Diagnosis Date  . Atrial fibrillation and flutter ([place])    a. s/p PVI ablation in 07/2015 with continue PAF  b. s/p repeat ablation on 08/15/16. continued on Flecainide 100mg  BID  . Carotid bruit    Right  . Chronic back pain   . Depression with anxiety 03/29/2013  . Diverticulosis   . Fibromyalgia   . Foot drop, right   . GERD (gastroesophageal reflux disease)   . Hemorrhoids   . HTN (hypertension)   . Hyperlipidemia   . IBS (irritable bowel syndrome)   . Memory loss 02/11/2015  . Onychomycosis 07/03/2013  . Osteoporosis   . Sigmoid colon ulcer 03/23/2012    Past Surgical History:  Procedure Laterality Date  . ABDOMINAL HYSTERECTOMY  1981  . BACK SURGERY  1997   reconstructive lower back surgery  . CARDIOVERSION N/A 07/01/2016   Procedure: CARDIOVERSION;  Surgeon: Chelsea Headings, MD;  Location: [place];  Service: Cardiovascular;  Laterality: N/A;  . CARDIOVERSION N/A 10/22/2017   Procedure: CARDIOVERSION;  Surgeon: Chelsea Casino, MD;  Location: [facility] ENDOSCOPY;  Service: Cardiovascular;  Laterality: N/A;  . CARPAL TUNNEL RELEASE Left 2012   ulnar nerve release at elbow  . CERVICAL FUSION  2003, 2005, 2013  . COLONOSCOPY    .  ELECTROPHYSIOLOGIC STUDY N/A 08/10/2015   Procedure: Afib;  Surgeon: Chelsea Meredith Leeds, MD;  Location: [place] CV LAB;  Service: Cardiovascular;  Laterality: N/A;  . ELECTROPHYSIOLOGIC STUDY N/A 08/15/2016   Procedure: Atrial Fibrillation Ablation;  Surgeon: Chelsea Meredith Leeds, MD;  Location: [place] CV LAB;  Service: Cardiovascular;  Laterality: N/A;  . EYE SURGERY  2015   b/l cataracts removed  . left elbow surgery  06/03/11   cyst removed  . LUMBAR LAMINECTOMY  1960   x 2  . POSTERIOR CERVICAL FUSION/FORAMINOTOMY  08/03/2012   Procedure: POSTERIOR  CERVICAL FUSION/FORAMINOTOMY LEVEL 5;  Surgeon: Chelsea Miss, MD;  Location: [place] NEURO ORS;  Service: Neurosurgery;  Laterality: N/A;  Cervical four to Thoracic two Posterior cervical decompression with Facet and Pedicle screw fixation  . TEE WITHOUT CARDIOVERSION N/A 10/22/2017   Procedure: TRANSESOPHAGEAL ECHOCARDIOGRAM (TEE);  Surgeon: Chelsea Casino, MD;  Location: [facility] ENDOSCOPY;  Service: Cardiovascular;  Laterality: N/A;  . TONSILLECTOMY  1943  . TOTAL HIP ARTHROPLASTY Left 2012  . TOTAL KNEE ARTHROPLASTY Right 11/20/2014   Procedure: RIGHT TOTAL KNEE ARTHROPLASTY;  Surgeon: Chelsea Alf, MD;  Location: [facility] ORS;  Service: Orthopedics;  Laterality: Right;  . veins stripped  1970  . WRIST SURGERY Right 1990's   cyst removed     Home Medications:  Prior to Admission medications   Medication Sig Start Date End Date Taking? Authorizing Provider  amiodarone (PACERONE) 200 MG tablet Take 1 tablet (200 mg total) by mouth daily. 04/07/19   Camnitz, Chelsea Hassell Done, MD  atorvastatin (LIPITOR) 10 MG tablet Take 10 mg by mouth daily.    [provider]  bisoprolol (ZEBETA) 5 MG tablet Take 1 tablet (5 mg total) by mouth daily. 04/07/19   Camnitz, Ocie Doyne, MD  Calcium Carb-Cholecalciferol 600-200 MG-UNIT TABS Take 1 tablet by mouth daily.    [provider]  clobetasol (TEMOVATE) 0.05 % GEL APPLY TO THE AFFECTED AREA OF SKIN ON ARMS TWICE DAILY 07/08/18   [provider]  denosumab (PROLIA) 60 MG/ML SOSY injection Inject 60 mg into the skin every 6 (six) months.    [provider]  ELIQUIS 5 MG TABS tablet Take 1 tablet by mouth twice daily. 03/14/19   Camnitz, Chelsea Hassell Done, MD  esomeprazole (NEXIUM) 40 MG capsule TAKE ONE CAPSULE BY MOUTH EVERY Hancock AT 12 NOON 05/17/18   Camnitz, Chelsea Hassell Done, MD  eszopiclone (LUNESTA) 2 MG TABS tablet Take 1 tablet (2 mg total) by mouth at bedtime as needed for sleep. Take immediately before bedtime Patient taking differently: Take 2  mg by mouth at bedtime. Take immediately before bedtime 02/26/15   Chelsea Lukes, MD  gabapentin (NEURONTIN) 600 MG tablet TAKE 1 TABLET(600 MG) BY MOUTH TWICE DAILY 08/13/15   Darlin Coco, MD  hydrOXYzine (ATARAX/VISTARIL) 10 MG tablet Take 10-20 mg by mouth at bedtime as needed for itching (sleep).    [provider]  levothyroxine (SYNTHROID, LEVOTHROID) 25 MCG tablet Take 25 mcg by mouth daily. 07/22/18   [provider]  MEGARED OMEGA-3 KRILL OIL 500 MG CAPS Take 500 mg by mouth daily.     [provider]  Melatonin 10 MG CAPS Take 10 mg by mouth at bedtime.    [provider]  Multiple Vitamins-Minerals (CENTRUM SILVER ULTRA WOMENS) TABS Take 1 tablet by mouth daily.    [provider]  nortriptyline (PAMELOR) 25 MG capsule Take 50 mg by mouth at bedtime.     [provider]  Polyethyl Glycol-Propyl Glycol (SYSTANE OP) Place 1 drop into both eyes daily as needed (for dry eyes).    [provider]  ZETIA 10 MG tablet TAKE 1 TABLET BY MOUTH EVERY Hancock Patient taking differently: TAKE 10 MG BY MOUTH AT BEDTIME 04/06/15   Chelsea Lukes, MD    Inpatient Medications: Scheduled Meds:  Continuous Infusions: . sodium chloride     PRN Meds: acetaminophen, alum & mag hydroxide-simeth **AND** lidocaine, morphine injection, nitroGLYCERIN, ondansetron (ZOFRAN) IV  Allergies:    Allergies  Allergen Reactions  . Cefuroxime Axetil Anaphylaxis  . Macrodantin Anaphylaxis  . Alendronate Sodium Other (See Comments)    Severe chest pain similar to heart attack   . Codeine Nausea And Vomiting  . Demerol [Meperidine] Other (See Comments)    hallucinations  . Meperidine Hcl Nausea And Vomiting  . Other Other (See Comments)    Hismanal and Maprobamate portobello mushrooms - diarrhea  . Penicillins Swelling and Other (See Comments)    Has patient had a PCN reaction causing immediate rash, facial/tongue/throat swelling, SOB or  lightheadedness with hypotension: Yes Has patient had a PCN reaction causing severe rash involving mucus membranes or skin necrosis: No Has patient had a PCN reaction that required hospitalization: Yes Has patient had a PCN reaction occurring within the last 10 years: Yes If all of the above answers are "NO", then may proceed with Cephalosporin use.   . Sulfonamide Derivatives Hives, Itching and Swelling    Social History:   Social History   Socioeconomic History  . Marital status: Divorced    Spouse name: Not on file  . Number of children: Not on file  . Years of education: Not on file  . Highest education level: Not on file  Occupational History  . Not on file  Social Needs  . Financial resource strain: Not on file  . Food insecurity    Worry: Not on file    Inability: Not on file  . Transportation needs    Medical: Not on file    Non-medical: Not on file  Tobacco Use  . Smoking status: Former Smoker    Packs/Hancock: 0.25    Years: 4.00    Pack years: 1.00    Types: Cigarettes  . Smokeless tobacco: Never Used  . Tobacco comment: quit 20+yrs ago  Substance and Sexual Activity  . Alcohol use: Yes    Alcohol/week: 0.0 standard drinks    Comment: WINE WITH DINNER. 2 GLASSES RED WINE  . Drug use: No  . Sexual activity: Yes    Partners: Male  Lifestyle  . Physical activity    Days per week: Not on file    Minutes per session: Not on file  . Stress: Not on file  Relationships  . Social Herbalist on phone: Not on file    Gets together: Not on file    Attends religious service: Not on file    Active member of club or organization: Not on file    Attends meetings of clubs or organizations: Not on file    Relationship status: Not on file  . Intimate partner violence    Fear of current or ex partner: Not on file    Emotionally abused: Not on file    Physically abused: Not on file    Forced sexual activity: Not on file  Other Topics Concern  . Not on file   Social History Narrative  . Not on file  Family History:    Family History  Problem Relation Age of Onset  . Bipolar disorder Mother   . Stomach cancer Mother   . Mental illness Mother   . Anxiety disorder Mother   . Stroke Father   . Colon cancer Father   . HIV/AIDS Son   . Stomach cancer Maternal Grandfather   . Stomach cancer Paternal Grandmother   . Healthy Daughter   . Heart disease Maternal Grandmother   . Throat cancer Maternal Uncle        larynx  . Anesthesia problems Neg Hx   . Diabetes Neg Hx      ROS:  Please see the history of present illness.   All other ROS reviewed and negative.     Physical Exam/Data:   Vitals:   04/16/19 0645 04/16/19 0715 04/16/19 0745 04/16/19 0835  BP: 134/61 133/64 138/63 (!) 158/70  Pulse: (!) 50 (!) 48 (!) 47 (!) 49  Resp: (!) 21 (!) 24 (!) 23 20  Temp:    97.7 F (36.5 C)  TempSrc:    Oral  SpO2: 98% 98% 94% 98%  Weight:    66.8 kg  Height:    5\' 5"  (1.651 m)   No intake or output data in the 24 hours ending 04/16/19 1017 Last 3 Weights 04/16/2019 04/16/2019 09/30/2018  Weight (lbs) 147 lb 3.2 oz 147 lb 148 lb  Weight (kg) 66.769 kg 66.679 kg 67.132 kg     Body mass index is 24.5 kg/m.  General:  Well nourished, well developed, in no acute distress HEENT: normal Neck: no JVD Vascular: No carotid bruits  Cardiac:  normal S1, S2; RRR; no murmur  Lungs:  clear to auscultation bilaterally, no wheezing, rhonchi or rales  Abd: soft, nontender, no hepatomegaly  Ext: no edema Musculoskeletal:  No deformities, BUE and BLE strength normal and equal Skin: warm and dry  Neuro:  CNs 2-12 intact, no focal abnormalities noted Psych:  Normal affect   EKG:  The EKG was personally reviewed and demonstrates:  Sinus bradycardia with HR 52, first degree heart block PR interval 66 (old) Telemetry:  Telemetry was personally reviewed and demonstrates:  Sinus bradycardia in the 40-50s  Relevant CV Studies:  Echo pending  TEE  10/22/17: Study Conclusions  - Left ventricle: There was mild concentric hypertrophy. Systolic   function was mildly reduced. The estimated ejection fraction was   in the range of 45% to 50%. Diffuse hypokinesis. No evidence of   thrombus. - Aortic valve: No evidence of vegetation. - Mitral valve: There was mild regurgitation. - Left atrium: No evidence of thrombus in the atrial cavity or   appendage. No evidence of thrombus in the atrial cavity or   appendage. - Right atrium: No evidence of thrombus in the atrial cavity or   appendage. 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 This was visualized in 2D and 3D modes. - Tricuspid valve: There was mild regurgitation. - Pulmonic valve: There was trivial regurgitation.  Impressions:  - Successful cardioversion. No cardiac source of emboli was   indentified. LVEF 45-50% with global hypokinesis. Incidental   finding of sinus venosus ASD (see body of report) - additional   imaging (or further review of her prior  cardiac CT scans) and   surgical evaluation is recommended.  Laboratory Data:  High Sensitivity Troponin:   Recent Labs  Lab 04/16/19 0048 04/16/19 0352  TROPONINIHS 3 <2     Cardiac EnzymesNo results for input(s): TROPONINI in the last 168 hours. No results for input(s): TROPIPOC in the last 168 hours.  Chemistry Recent Labs  Lab 04/16/19 0048  NA 136  K 4.0  CL 101  CO2 24  GLUCOSE 108*  BUN 22  CREATININE 1.04*  CALCIUM 9.3  GFRNONAA 50*  GFRAA 58*  ANIONGAP 11    No results for input(s): PROT, ALBUMIN, AST, ALT, ALKPHOS, BILITOT in the last 168 hours. Hematology Recent Labs  Lab 04/16/19 0048  WBC 5.6  RBC 4.14  HGB 12.4  HCT 38.8  MCV 93.7  MCH 30.0  MCHC 32.0  RDW 14.1   PLT 196   BNPNo results for input(s): BNP, PROBNP in the last 168 hours.  DDimer No results for input(s): DDIMER in the last 168 hours.   Radiology/Studies:  Dg Chest 2 View  Result Date: 04/16/2019 CLINICAL DATA:  85 year old female with chest pain. EXAM: CHEST - 2 VIEW COMPARISON:  Chest radiograph dated 07/19/2016 and CT dated 08/06/2015 FINDINGS: There is no focal consolidation, pleural effusion, or pneumothorax. Mild chronic interstitial coarsening. The cardiac silhouette is within normal limits. No acute osseous pathology. Mild thoracolumbar scoliosis. Partially visualized cervical spine fixation hardware. IMPRESSION: No active cardiopulmonary disease. Electronically Signed   By: Anner Crete M.D.   On: 04/16/2019 01:17     Assessment and Plan:   1. Chest pain - hs troponin x 2 negative - EKG without acute changes   - chest pain has resolved - unknown what resolved her pain - she describes somewhat atypical chest pain - - of note, CT coronary in 2017 with minimal nonobstructive CAD (Ca score of 11)   2. Atrial fibrillation 3. Chronic anticoagulation - s/p PVI ablation 07/2015 with continued PAF, repeat ablation 07/2016 - was on flecainide initially, now on amiodarone and bisoprolol - anticoagulated with eliquis - This patients CHA2DS2-VASc Score and unadjusted Ischemic Stroke Rate (% per year) is equal to 4.8 % stroke rate/year from a score of 4 (age, female, HTN) - no palpitations, sinus bradycardia - no bleeding problems   4. Hypertension - pressures have been labile - resume home medications on discharge   5. Hyperlipidemia - zetia and lipitor   6. Chronic diastolic heart failure - appears euvolemic - does have chronic lower extremity edema WOuld d/c IV fluids       For questions or updates, please contact [place] Please consult [email or web address] for contact info under     Signed, Ledora Bottcher, PA  04/16/2019 10:17 AM  PT seen and  examined  I agree with findings as noted by A DUke above  CP is very atypical for cardiac   Occurred at rest  Sharp   Nothing with activity   Note she does do yard work.  She may have pulled something in chest  On exam: Neck:  JVP is normal Lungs are CTA Cardiac RRR  No S3   No murmurs  Chest minimal tenderness Ext are without edema  EKG is without acute changes  I have reviewed echo images   LVEf and RVEF are normal   I do not think pain is cardiac in origin.  ? Related to gardening  (muscular) I would not plan further cardiac testing  OK to d/c from cardiac  stand point  Dorris Carnes MD

## 2019-04-18 NOTE — Discharge Summary (Signed)
Physician Discharge Summary  Chelsea Hancock TIR:[id number] DOB: 03/18/1934 DOA: 04/16/2019  PCP: Lajean Manes, MD  Admit date: 04/16/2019 Discharge date: 04/16/2019  Admitted From: Home Disposition:  Home  Recommendations for Outpatient Follow-up:  1. Follow up with PCP in 1-2 weeks 2. Please follow-up kidney function   Home Health:No Equipment/Devices:None Discharge Condition: Stable CODE STATUS:Full Diet recommendation: Heart Healthy   Brief/Interim Summary: Chelsea Hancock is a 85 y.o. female with medical history significant of atrial fibrillation s/p cardioversion in 09/2017, on chronic anticoagulation, ASD, hypertension, hyperlipidemia, GERD, and osteoporosis; who presented with complaints of left-sided chest pain starting last night.  She states that the pain was sharp and stabbing.  She chronically has lower extremity swelling.  Denies having any associated symptoms of nausea, vomiting, diaphoresis, abdominal pain, calf pain, lightheadedness, or shortness of breath.  She is followed by Dr. Curt Bears in the outpatient setting and had negative CT coronary study performed in 07/2016 with a coronary artery calcium score of 11.8.  She does not know what made the pain go away or how long that it took to resolve.  Chest pain: Acute.  Patient reports having acute chest pain with radiation into her neck.  Troponins negative x2. CXR showed no acute cardiopulmonary disease.  Patient had a low risk coronary CT back in 11/ 2017.  Heart score = 5.  Cardiology was formally consulted.  Echocardiogram as seen below showed EF of 60-65%.  Renal insufficiency: On admission patient's creatinine 1.04 with BUN 22.  Patient's baseline creatinine previously noted to be 0.8-0.9 suspect aspect of dehydration given patient has been n.p.o. patient received gentle IV fluids at 100 mL/h while in the hospital. Patient was encouraged to keep her self adequately hydrated and follow with the primary care  provider in outpatient setting.  Other persistent atrial fibrillation on chronic anticoagulation: Patient with previous history of ablation and cardioversion last performed on 10/22/2017.  Currently bradycardic with heart rates in the 40s and 50s.  Followed by Dr. Curt Bears in outpatient setting. CHA2DS2-VASc score =4.  Continue Eliquis, amiodarone, and bisoprolol.  Essential hypertension: Blood pressure currently stable. Continue bisoprolol and amiodarone.  Chronic systolic congestive heart failure: Patient appears relatively euvolemic at this time.  Patient with chronic lower extremity edema.  Last EF noted to be 45 to 50% with diffuse hypokinesis in 09/2017.  Repeat echocardiogram performed today showing improvement of heart function.  Dyslipidemia: Total cholesterol found to be 161, HDL 53, LDL 84, triglycerides 120. Continue Zetia and Lipitor.   Discharge Diagnoses:  Principal Problem:   Chest pain Active Problems:   Dyslipidemia   HTN (hypertension)   AF (atrial fibrillation) (HCC)   Chronic systolic CHF (congestive heart failure) (HCC)   Renal insufficiency    Discharge Instructions  Discharge Instructions    Diet - low sodium heart healthy   Complete by: As directed    Discharge instructions   Complete by: As directed    Today your chest pain symptoms were evaluated and thought to be less likely cardiac in nature.  You were evaluated by cardiology and they felt that you were safe to be discharged home.  Please follow-up with your primary care provider regarding your recent visit.   Increase activity slowly   Complete by: As directed      Allergies as of 04/16/2019      Reactions   Cefuroxime Axetil Anaphylaxis   Macrodantin Anaphylaxis   Alendronate Sodium Other (See Comments)   Severe chest pain similar to heart  attack    Codeine Nausea And Vomiting   Demerol [meperidine] Other (See Comments)   hallucinations   Meperidine Hcl Nausea And Vomiting   Other Other (See  Comments)   Hismanal and Maprobamate portobello mushrooms - diarrhea   Penicillins Swelling, Other (See Comments)   Has patient had a PCN reaction causing immediate rash, facial/tongue/throat swelling, SOB or lightheadedness with hypotension: Yes Has patient had a PCN reaction causing severe rash involving mucus membranes or skin necrosis: No Has patient had a PCN reaction that required hospitalization: Yes Has patient had a PCN reaction occurring within the last 10 years: Yes If all of the above answers are "NO", then may proceed with Cephalosporin use.   Sulfonamide Derivatives Hives, Itching, Swelling      Medication List    TAKE these medications   amiodarone 200 MG tablet Commonly known as: PACERONE Take 1 tablet (200 mg total) by mouth daily.   atorvastatin 10 MG tablet Commonly known as: LIPITOR Take 10 mg by mouth daily.   bisoprolol 5 MG tablet Commonly known as: ZEBETA Take 1 tablet (5 mg total) by mouth daily.   Calcium Carb-Cholecalciferol 600-200 MG-UNIT Tabs Take 1 tablet by mouth daily.   Centrum Silver Ultra Womens Tabs Take 1 tablet by mouth daily.   clobetasol 0.05 % Gel Commonly known as: TEMOVATE APPLY TO THE AFFECTED AREA OF SKIN ON ARMS TWICE DAILY   denosumab 60 MG/ML Sosy injection Commonly known as: PROLIA Inject 60 mg into the skin every 6 (six) months.   Eliquis 5 MG Tabs tablet Generic drug: apixaban Take 1 tablet by mouth twice daily.   esomeprazole 40 MG capsule Commonly known as: NEXIUM TAKE ONE CAPSULE BY MOUTH EVERY DAY AT 12 NOON   eszopiclone 2 MG Tabs tablet Commonly known as: Lunesta Take 1 tablet (2 mg total) by mouth at bedtime as needed for sleep. Take immediately before bedtime What changed: when to take this   ezetimibe 10 MG tablet Commonly known as: Zetia TAKE 10 MG BY MOUTH AT BEDTIME What changed:   how much to take  how to take this  when to take this  additional instructions   gabapentin 600 MG  tablet Commonly known as: NEURONTIN TAKE 1 TABLET(600 MG) BY MOUTH TWICE DAILY   hydrOXYzine 10 MG tablet Commonly known as: ATARAX/VISTARIL Take 10-20 mg by mouth at bedtime as needed for itching (sleep).   levothyroxine 25 MCG tablet Commonly known as: SYNTHROID Take 1 tablet (25 mcg total) by mouth daily before breakfast. What changed: when to take this   MegaRed Omega-3 Krill Oil 500 MG Caps Take 500 mg by mouth daily.   Melatonin 10 MG Caps Take 10 mg by mouth at bedtime.   nortriptyline 25 MG capsule Commonly known as: PAMELOR Take 50 mg by mouth at bedtime.   SYSTANE OP Place 1 drop into both eyes daily as needed (for dry eyes).      Follow-up Information    Stoneking, Hal, MD. Schedule an appointment as soon as possible for a visit in 1 week(s).   Specialty: Internal Medicine Contact information: [address] [place] [address] [phone]        Constance Haw, MD .   Specialty: Cardiology Contact information: [address] [place] [address] [phone]          Allergies  Allergen Reactions  . Cefuroxime Axetil Anaphylaxis  . Macrodantin Anaphylaxis  . Alendronate Sodium Other (See  Comments)    Severe chest pain similar to heart attack   . Codeine Nausea And Vomiting  . Demerol [Meperidine] Other (See Comments)    hallucinations  . Meperidine Hcl Nausea And Vomiting  . Other Other (See Comments)    Hismanal and Maprobamate portobello mushrooms - diarrhea  . Penicillins Swelling and Other (See Comments)    Has patient had a PCN reaction causing immediate rash, facial/tongue/throat swelling, SOB or lightheadedness with hypotension: Yes Has patient had a PCN reaction causing severe rash involving mucus membranes or skin necrosis: No Has patient had a PCN reaction that required hospitalization: Yes Has patient had a PCN reaction occurring within the last 10 years: Yes If all of the above answers are  "NO", then may proceed with Cephalosporin use.   . Sulfonamide Derivatives Hives, Itching and Swelling    Consultations:  [facility] Dorris Carnes, MD     Procedures/Studies: Dg Chest 2 View  Result Date: 04/16/2019 CLINICAL DATA:  85 year old female with chest pain. EXAM: CHEST - 2 VIEW COMPARISON:  Chest radiograph dated 07/19/2016 and CT dated 08/06/2015 FINDINGS: There is no focal consolidation, pleural effusion, or pneumothorax. Mild chronic interstitial coarsening. The cardiac silhouette is within normal limits. No acute osseous pathology. Mild thoracolumbar scoliosis. Partially visualized cervical spine fixation hardware. IMPRESSION: No active cardiopulmonary disease. Electronically Signed   By: Anner Crete M.D.   On: 04/16/2019 01:17   Echocardiogram 04/16/2019: Impression   1. The left ventricle has normal systolic function with an ejection fraction of 60-65%. The cavity size was normal. Left ventricular diastolic Doppler parameters are consistent with restrictive filling. The E/e' is 24.  2. The right ventricle has normal systolic function. The cavity was normal. There is no increase in right ventricular wall thickness.  3. Tricuspid valve regurgitation is mild-moderate.  4. The aortic valve is tricuspid. Mild sclerosis of the aortic valve. No stenosis of the aortic valve.  5. Evidence of atrial level shunting detected by color flow Doppler, at the level of the fossa ovalis, with left to right shunt. Given the location, this is most consistent with iatrogenic shunt from prior transseptal puncture for atrial fibrillation  ablation. Though this study was not specifically performed to evaluate for sinus venous ASD, no evidence is seen. Independent review of CT pulmonary vein study 08/08/2016 and TEE 10/22/2017 performed.  Subjective: Patient currently denies having any chest pain and is ready to go home able to be discharged.   Discharge Exam: Vitals:   04/16/19 1146 04/16/19  1648  BP: (!) 138/52 (!) 137/55  Pulse: (!) 47 (!) 51  Resp: 20 20  Temp: (!) 97.4 F (36.3 C) 98.2 F (36.8 C)  SpO2: 97% 98%   Vitals:   04/16/19 0745 04/16/19 0835 04/16/19 1146 04/16/19 1648  BP: 138/63 (!) 158/70 (!) 138/52 (!) 137/55  Pulse: (!) 47 (!) 49 (!) 47 (!) 51  Resp: (!) 23 20 20 20   Temp:  97.7 F (36.5 C) (!) 97.4 F (36.3 C) 98.2 F (36.8 C)  TempSrc:  Oral Oral Oral  SpO2: 94% 98% 97% 98%  Weight:  66.8 kg    Height:  5\' 5"  (1.651 m)      General: Pt is alert, awake, not in acute distress Cardiovascular: RRR, S1/S2 +, no rubs, no gallops Respiratory: CTA bilaterally, no wheezing, no rhonchi Abdominal: Soft, NT, ND, bowel sounds + Extremities: no edema, no cyanosis    The results of significant diagnostics from this hospitalization (including imaging, microbiology,  ancillary and laboratory) are listed below for reference.     Microbiology: Recent Results (from the past 240 hour(s))  SARS Coronavirus 2 (CEPHEID - Performed in [place] hospital lab), Hosp Order     Status: None   Collection Time: 04/16/19  6:55 AM   Specimen: Nasopharyngeal Swab  Result Value Ref Range Status   SARS Coronavirus 2 NEGATIVE NEGATIVE Final    Comment: (NOTE) If result is NEGATIVE SARS-CoV-2 target nucleic acids are NOT DETECTED. The SARS-CoV-2 RNA is generally detectable in upper and lower  respiratory specimens during the acute phase of infection. The lowest  concentration of SARS-CoV-2 viral copies this assay can detect is 250  copies / mL. A negative result does not preclude SARS-CoV-2 infection  and should not be used as the sole basis for treatment or other  patient management decisions.  A negative result may occur with  improper specimen collection / handling, submission of specimen other  than nasopharyngeal swab, presence of viral mutation(s) within the  areas targeted by this assay, and inadequate number of viral copies  (<250 copies / mL). A negative  result must be combined with clinical  observations, patient history, and epidemiological information. If result is POSITIVE SARS-CoV-2 target nucleic acids are DETECTED. The SARS-CoV-2 RNA is generally detectable in upper and lower  respiratory specimens dur ing the acute phase of infection.  Positive  results are indicative of active infection with SARS-CoV-2.  Clinical  correlation with patient history and other diagnostic information is  necessary to determine patient infection status.  Positive results do  not rule out bacterial infection or co-infection with other viruses. If result is PRESUMPTIVE POSTIVE SARS-CoV-2 nucleic acids MAY BE PRESENT.   A presumptive positive result was obtained on the submitted specimen  and confirmed on repeat testing.  While 2019 novel coronavirus  (SARS-CoV-2) nucleic acids may be present in the submitted sample  additional confirmatory testing may be necessary for epidemiological  and / or clinical management purposes  to differentiate between  SARS-CoV-2 and other Sarbecovirus currently known to infect humans.  If clinically indicated additional testing with an alternate test  methodology [phone]) is advised. The SARS-CoV-2 RNA is generally  detectable in upper and lower respiratory sp ecimens during the acute  phase of infection. The expected result is Negative. Fact Sheet for Patients:  StrictlyIdeas.no Fact Sheet for Healthcare Providers: BankingDealers.co.za This test is not yet approved or cleared by the [place] FDA and has been authorized for detection and/or diagnosis of SARS-CoV-2 by FDA under an Emergency Use Authorization (EUA).  This EUA will remain in effect (meaning this test can be used) for the duration of the COVID-19 declaration under Section 564(b)(1) of the Act, 21 U.S.C. section 360bbb-3(b)(1), unless the authorization is terminated or revoked sooner. Performed at [facility], [place] [address] [place], [place] [address]      Labs: BNP (last 3 results) No results for input(s): BNP in the last 8760 hours. Basic Metabolic Panel: Recent Labs  Lab 04/16/19 0048  NA 136  K 4.0  CL 101  CO2 24  GLUCOSE 108*  BUN 22  CREATININE 1.04*  CALCIUM 9.3   Liver Function Tests: No results for input(s): AST, ALT, ALKPHOS, BILITOT, PROT, ALBUMIN in the last 168 hours. No results for input(s): LIPASE, AMYLASE in the last 168 hours. No results for input(s): AMMONIA in the last 168 hours. CBC: Recent Labs  Lab 04/16/19 0048  WBC 5.6  HGB 12.4  HCT 38.8  MCV 93.7  PLT 196   Cardiac Enzymes: No results for input(s): CKTOTAL, CKMB, CKMBINDEX, TROPONINI in the last 168 hours. BNP: Invalid input(s): POCBNP CBG: No results for input(s): GLUCAP in the last 168 hours. D-Dimer No results for input(s): DDIMER in the last 72 hours. Hgb A1c No results for input(s): HGBA1C in the last 72 hours. Lipid Profile Recent Labs    04/16/19 0352  CHOL 161  HDL 53  LDLCALC 84  TRIG 120  CHOLHDL 3.0   Thyroid function studies No results for input(s): TSH, T4TOTAL, T3FREE, THYROIDAB in the last 72 hours.  Invalid input(s): FREET3 Anemia work up No results for input(s): VITAMINB12, FOLATE, FERRITIN, TIBC, IRON, RETICCTPCT in the last 72 hours. Urinalysis    Component Value Date/Time   COLORURINE YELLOW 08/05/2015 0954   APPEARANCEUR CLOUDY (A) 08/05/2015 0954   LABSPEC 1.022 08/05/2015 0954   PHURINE 5.5 08/05/2015 0954   GLUCOSEU NEGATIVE 08/05/2015 0954   GLUCOSEU NEGATIVE 01/29/2015 1700   HGBUR TRACE (A) 08/05/2015 0954   HGBUR negative 09/20/2009 0812   BILIRUBINUR NEGATIVE 08/05/2015 0954   BILIRUBINUR 1 01/26/2015 1345   KETONESUR NEGATIVE 08/05/2015 0954   PROTEINUR NEGATIVE 08/05/2015 0954   UROBILINOGEN 0.2 08/05/2015 0954   NITRITE NEGATIVE 08/05/2015 0954   LEUKOCYTESUR NEGATIVE 08/05/2015 0954   Sepsis Labs Invalid input(s):  PROCALCITONIN,  WBC,  LACTICIDVEN Microbiology Recent Results (from the past 240 hour(s))  SARS Coronavirus 2 (CEPHEID - Performed in [place] hospital lab), Hosp Order     Status: None   Collection Time: 04/16/19  6:55 AM   Specimen: Nasopharyngeal Swab  Result Value Ref Range Status   SARS Coronavirus 2 NEGATIVE NEGATIVE Final    Comment: (NOTE) If result is NEGATIVE SARS-CoV-2 target nucleic acids are NOT DETECTED. The SARS-CoV-2 RNA is generally detectable in upper and lower  respiratory specimens during the acute phase of infection. The lowest  concentration of SARS-CoV-2 viral copies this assay can detect is 250  copies / mL. A negative result does not preclude SARS-CoV-2 infection  and should not be used as the sole basis for treatment or other  patient management decisions.  A negative result may occur with  improper specimen collection / handling, submission of specimen other  than nasopharyngeal swab, presence of viral mutation(s) within the  areas targeted by this assay, and inadequate number of viral copies  (<250 copies / mL). A negative result must be combined with clinical  observations, patient history, and epidemiological information. If result is POSITIVE SARS-CoV-2 target nucleic acids are DETECTED. The SARS-CoV-2 RNA is generally detectable in upper and lower  respiratory specimens dur ing the acute phase of infection.  Positive  results are indicative of active infection with SARS-CoV-2.  Clinical  correlation with patient history and other diagnostic information is  necessary to determine patient infection status.  Positive results do  not rule out bacterial infection or co-infection with other viruses. If result is PRESUMPTIVE POSTIVE SARS-CoV-2 nucleic acids MAY BE PRESENT.   A presumptive positive result was obtained on the submitted specimen  and confirmed on repeat testing.  While 2019 novel coronavirus  (SARS-CoV-2) nucleic acids may be present in the  submitted sample  additional confirmatory testing may be necessary for epidemiological  and / or clinical management purposes  to differentiate between  SARS-CoV-2 and other Sarbecovirus currently known to infect humans.  If clinically indicated additional testing with an alternate test  methodology [phone]) is advised. The SARS-CoV-2 RNA  is generally  detectable in upper and lower respiratory sp ecimens during the acute  phase of infection. The expected result is Negative. Fact Sheet for Patients:  StrictlyIdeas.no Fact Sheet for Healthcare Providers: BankingDealers.co.za This test is not yet approved or cleared by the [place] FDA and has been authorized for detection and/or diagnosis of SARS-CoV-2 by FDA under an Emergency Use Authorization (EUA).  This EUA will remain in effect (meaning this test can be used) for the duration of the COVID-19 declaration under Section 564(b)(1) of the Act, 21 U.S.C. section 360bbb-3(b)(1), unless the authorization is terminated or revoked sooner. Performed at [facility], [place] [address], [place], [place] [address]      Time coordinating discharge: less than 30 minutes  SIGNED:   Norval Morton, MD  [facility] 04/18/2019, 1:35 PM Pager   If 7PM-7AM, please contact night-coverage [email or web address] Password [email or web address]

## 2019-04-20 DIAGNOSIS — E039 Hypothyroidism, unspecified: Secondary | ICD-10-CM | POA: Diagnosis not present

## 2019-04-20 DIAGNOSIS — H35033 Hypertensive retinopathy, bilateral: Secondary | ICD-10-CM | POA: Diagnosis not present

## 2019-04-20 DIAGNOSIS — R0789 Other chest pain: Secondary | ICD-10-CM | POA: Diagnosis not present

## 2019-04-20 DIAGNOSIS — I480 Paroxysmal atrial fibrillation: Secondary | ICD-10-CM | POA: Diagnosis not present

## 2019-05-02 DIAGNOSIS — I499 Cardiac arrhythmia, unspecified: Secondary | ICD-10-CM | POA: Diagnosis not present

## 2019-05-02 DIAGNOSIS — M810 Age-related osteoporosis without current pathological fracture: Secondary | ICD-10-CM | POA: Diagnosis not present

## 2019-05-02 DIAGNOSIS — E785 Hyperlipidemia, unspecified: Secondary | ICD-10-CM | POA: Diagnosis not present

## 2019-05-02 DIAGNOSIS — E559 Vitamin D deficiency, unspecified: Secondary | ICD-10-CM | POA: Diagnosis not present

## 2019-05-02 DIAGNOSIS — M179 Osteoarthritis of knee, unspecified: Secondary | ICD-10-CM | POA: Diagnosis not present

## 2019-05-02 DIAGNOSIS — M797 Fibromyalgia: Secondary | ICD-10-CM | POA: Diagnosis not present

## 2019-05-03 DIAGNOSIS — Z79899 Other long term (current) drug therapy: Secondary | ICD-10-CM | POA: Diagnosis not present

## 2019-05-05 DIAGNOSIS — M810 Age-related osteoporosis without current pathological fracture: Secondary | ICD-10-CM | POA: Diagnosis not present

## 2019-05-05 DIAGNOSIS — E039 Hypothyroidism, unspecified: Secondary | ICD-10-CM | POA: Diagnosis not present

## 2019-05-05 DIAGNOSIS — I480 Paroxysmal atrial fibrillation: Secondary | ICD-10-CM | POA: Diagnosis not present

## 2019-05-05 DIAGNOSIS — H35033 Hypertensive retinopathy, bilateral: Secondary | ICD-10-CM | POA: Diagnosis not present

## 2019-06-07 DIAGNOSIS — E039 Hypothyroidism, unspecified: Secondary | ICD-10-CM | POA: Diagnosis not present

## 2019-06-22 DIAGNOSIS — E039 Hypothyroidism, unspecified: Secondary | ICD-10-CM | POA: Diagnosis not present

## 2019-06-22 DIAGNOSIS — I480 Paroxysmal atrial fibrillation: Secondary | ICD-10-CM | POA: Diagnosis not present

## 2019-06-22 DIAGNOSIS — H35033 Hypertensive retinopathy, bilateral: Secondary | ICD-10-CM | POA: Diagnosis not present

## 2019-06-22 DIAGNOSIS — M810 Age-related osteoporosis without current pathological fracture: Secondary | ICD-10-CM | POA: Diagnosis not present

## 2019-07-05 ENCOUNTER — Telehealth: Payer: Self-pay | Admitting: Cardiology

## 2019-07-05 ENCOUNTER — Encounter: Payer: Self-pay | Admitting: Cardiology

## 2019-07-05 ENCOUNTER — Ambulatory Visit (INDEPENDENT_AMBULATORY_CARE_PROVIDER_SITE_OTHER): Payer: Medicare Other | Admitting: Cardiology

## 2019-07-05 ENCOUNTER — Other Ambulatory Visit: Payer: Self-pay

## 2019-07-05 VITALS — BP 138/76 | HR 56 | Ht 65.0 in | Wt 150.4 lb

## 2019-07-05 DIAGNOSIS — Z79899 Other long term (current) drug therapy: Secondary | ICD-10-CM | POA: Diagnosis not present

## 2019-07-05 DIAGNOSIS — I4819 Other persistent atrial fibrillation: Secondary | ICD-10-CM | POA: Diagnosis not present

## 2019-07-05 LAB — HEPATIC FUNCTION PANEL
ALT: 22 IU/L (ref 0–32)
AST: 23 IU/L (ref 0–40)
Albumin: 4.1 g/dL (ref 3.6–4.6)
Alkaline Phosphatase: 84 IU/L (ref 39–117)
Bilirubin Total: <0.2 mg/dL (ref 0.0–1.2)
Bilirubin, Direct: 0.07 mg/dL (ref 0.00–0.40)
Total Protein: 6.2 g/dL (ref 6.0–8.5)

## 2019-07-05 LAB — TSH: TSH: 6.62 u[IU]/mL — ABNORMAL HIGH (ref 0.450–4.500)

## 2019-07-05 MED ORDER — AMIODARONE HCL 100 MG PO TABS: 100.0000 mg | ORAL_TABLET | Freq: Every day | ORAL | 1 refills | Status: DC

## 2019-07-05 NOTE — Progress Notes (Signed)
Electrophysiology Office Note   Date:  07/05/2019   ID:  Chelsea Hancock, DOB 06/07/1934, MRN [id number]  PCP:  Lajean Manes, MD  Cardiologist:  [name] Primary Electrophysiologist:  Constance Haw, MD    No chief complaint on file.    History of Present Illness: Chelsea Hancock is a 85 y.o. female who presents today for electrophysiology evaluation.   She had an ablation for atrial fib/flutter on 07/2015. Repeat ablation scheduled for 08/15/16. Since that time, she is felt well without major complaint. She did yard work last week without any major problems.  She reverted back to atrial fibrillation and had a repeat cardioversion on 10/22/17.  TEE showed an ASD.  Review of cardiac CT done prior to ablation showed no evidence of ASD.  Today, denies symptoms of palpitations, chest pain, shortness of breath, orthopnea, PND, lower extremity edema, claudication, dizziness, presyncope, syncope, bleeding, or neurologic sequela. The patient is tolerating medications without difficulties.  Noted no episodes of atrial fibrillation since last being seen.  She has been doing well without complaints.  Past Medical History:  Diagnosis Date  . Atrial fibrillation and flutter ([place])    a. s/p PVI ablation in 07/2015 with continue PAF  b. s/p repeat ablation on 08/15/16. continued on Flecainide 100mg  BID  . Carotid bruit    Right  . Chronic back pain   . Depression with anxiety 03/29/2013  . Diverticulosis   . Fibromyalgia   . Foot drop, right   . GERD (gastroesophageal reflux disease)   . Hemorrhoids   . HTN (hypertension)   . Hyperlipidemia   . IBS (irritable bowel syndrome)   . Memory loss 02/11/2015  . Onychomycosis 07/03/2013  . Osteoporosis   . Sigmoid colon ulcer 03/23/2012   Past Surgical History:  Procedure Laterality Date  . ABDOMINAL HYSTERECTOMY  1981  . BACK SURGERY  1997   reconstructive lower back surgery  . CARDIOVERSION N/A 07/01/2016   Procedure:  CARDIOVERSION;  Surgeon: Thayer Headings, MD;  Location: [place];  Service: Cardiovascular;  Laterality: N/A;  . CARDIOVERSION N/A 10/22/2017   Procedure: CARDIOVERSION;  Surgeon: Pixie Casino, MD;  Location: [facility] ENDOSCOPY;  Service: Cardiovascular;  Laterality: N/A;  . CARPAL TUNNEL RELEASE Left 2012   ulnar nerve release at elbow  . CERVICAL FUSION  2003, 2005, 2013  . COLONOSCOPY    . ELECTROPHYSIOLOGIC STUDY N/A 08/10/2015   Procedure: Afib;  Surgeon: Jaelon Gatley Meredith Leeds, MD;  Location: [place] CV LAB;  Service: Cardiovascular;  Laterality: N/A;  . ELECTROPHYSIOLOGIC STUDY N/A 08/15/2016   Procedure: Atrial Fibrillation Ablation;  Surgeon: Seira Cody Meredith Leeds, MD;  Location: [place] CV LAB;  Service: Cardiovascular;  Laterality: N/A;  . EYE SURGERY  2015   b/l cataracts removed  . left elbow surgery  06/03/11   cyst removed  . LUMBAR LAMINECTOMY  1960   x 2  . POSTERIOR CERVICAL FUSION/FORAMINOTOMY  08/03/2012   Procedure: POSTERIOR CERVICAL FUSION/FORAMINOTOMY LEVEL 5;  Surgeon: [name], MD;  Location: [place] NEURO ORS;  Service: Neurosurgery;  Laterality: N/A;  Cervical four to Thoracic two Posterior cervical decompression with Facet and Pedicle screw fixation  . TEE WITHOUT CARDIOVERSION N/A 10/22/2017   Procedure: TRANSESOPHAGEAL ECHOCARDIOGRAM (TEE);  Surgeon: Pixie Casino, MD;  Location: [facility] ENDOSCOPY;  Service: Cardiovascular;  Laterality: N/A;  . TONSILLECTOMY  1943  . TOTAL HIP ARTHROPLASTY Left 2012  . TOTAL KNEE ARTHROPLASTY Right 11/20/2014   Procedure: RIGHT TOTAL KNEE ARTHROPLASTY;  Surgeon: [name], MD;  Location: [facility];  Service: Orthopedics;  Laterality: Right;  . veins stripped  1970  . WRIST SURGERY Right 1990's   cyst removed     Current Outpatient Medications  Medication Sig Dispense Refill  . atorvastatin (LIPITOR) 10 MG tablet Take 10 mg by mouth daily.    . bisoprolol (ZEBETA) 5 MG tablet Take 1 tablet (5 mg total) by mouth daily.  90 tablet 1  . Calcium Carb-Cholecalciferol 600-200 MG-UNIT TABS Take 1 tablet by mouth daily.    . clobetasol (TEMOVATE) 0.05 % GEL APPLY TO THE AFFECTED AREA OF SKIN ON ARMS TWICE DAILY  0  . denosumab (PROLIA) 60 MG/ML SOSY injection Inject 60 mg into the skin every 6 (six) months.    Marland Kitchen ELIQUIS 5 MG TABS tablet Take 1 tablet by mouth twice daily. 60 tablet 3  . esomeprazole (NEXIUM) 40 MG capsule TAKE ONE CAPSULE BY MOUTH EVERY DAY AT 12 NOON 90 capsule 2  . eszopiclone (LUNESTA) 2 MG TABS tablet Take 1 tablet (2 mg total) by mouth at bedtime as needed for sleep. Take immediately before bedtime (Patient taking differently: Take 2 mg by mouth at bedtime. Take immediately before bedtime) 30 tablet 2  . ezetimibe (ZETIA) 10 MG tablet TAKE 10 MG BY MOUTH AT BEDTIME 30 tablet 6  . gabapentin (NEURONTIN) 600 MG tablet TAKE 1 TABLET(600 MG) BY MOUTH TWICE DAILY 180 tablet 0  . hydrOXYzine (ATARAX/VISTARIL) 10 MG tablet Take 10-20 mg by mouth at bedtime as needed for itching (sleep).    Marland Kitchen levothyroxine (SYNTHROID) 25 MCG tablet Take 1 tablet (25 mcg total) by mouth daily before breakfast.  5  . MEGARED OMEGA-3 KRILL OIL 500 MG CAPS Take 500 mg by mouth daily.     . Melatonin 10 MG CAPS Take 10 mg by mouth at bedtime.    . Multiple Vitamins-Minerals (CENTRUM SILVER ULTRA WOMENS) TABS Take 1 tablet by mouth daily.    . nortriptyline (PAMELOR) 25 MG capsule Take 50 mg by mouth at bedtime.     Vladimir Faster Glycol-Propyl Glycol (SYSTANE OP) Place 1 drop into both eyes daily as needed (for dry eyes).    Marland Kitchen amiodarone (PACERONE) 100 MG tablet Take 1 tablet (100 mg total) by mouth daily. 90 tablet 1   No current facility-administered medications for this visit.     Allergies:   Cefuroxime axetil, Macrodantin, Alendronate sodium, Codeine, Demerol [meperidine], Meperidine hcl, Other, Penicillins, and Sulfonamide derivatives   Social History:  The patient  reports that she has quit smoking. Her smoking use  included cigarettes. She has a 1.00 pack-year smoking history. She has never used smokeless tobacco. She reports current alcohol use. She reports that she does not use drugs.   Family History:  The patient's family history includes Anxiety disorder in her mother; Bipolar disorder in her mother; Colon cancer in her father; HIV/AIDS in her son; Healthy in her daughter; Heart disease in her maternal grandmother; Mental illness in her mother; Stomach cancer in her maternal grandfather, mother, and paternal grandmother; Stroke in her father; Throat cancer in her maternal uncle.   ROS:  Please see the history of present illness.   Otherwise, review of systems is positive for none.   All other systems are reviewed and negative.   PHYSICAL EXAM: VS:  BP 138/76   Pulse (!) 56   Ht 5\' 5"  (1.651 m)   Wt 150 lb 6.4 oz (68.2 kg)   SpO2 92%  BMI 25.03 kg/m  , BMI Body mass index is 25.03 kg/m. GEN: Well nourished, well developed, in no acute distress  HEENT: normal  Neck: no JVD, carotid bruits, or masses Cardiac: RRR; no murmurs, rubs, or gallops,no edema  Respiratory:  clear to auscultation bilaterally, normal work of breathing GI: soft, nontender, nondistended, + BS MS: no deformity or atrophy  Skin: warm and dry Neuro:  Strength and sensation are intact Psych: euthymic mood, full affect  EKG:  EKG is ordered today. Personal review of the ekg ordered shows sinus rhythm, poor R wave progression  Recent Labs: 11/11/2018: ALT 29 04/16/2019: BUN 22; Creatinine, Ser 1.04; Hemoglobin 12.4; Platelets 196; Potassium 4.0; Sodium 136    Lipid Panel     Component Value Date/Time   CHOL 161 04/16/2019 0352   TRIG 120 04/16/2019 0352   TRIG 133 06/30/2006 1110   HDL 53 04/16/2019 0352   CHOLHDL 3.0 04/16/2019 0352   VLDL 24 04/16/2019 0352   LDLCALC 84 04/16/2019 0352   LDLDIRECT 93.3 08/04/2008 0915     Wt Readings from Last 3 Encounters:  07/05/19 150 lb 6.4 oz (68.2 kg)  04/16/19 147 lb  3.2 oz (66.8 kg)  09/30/18 148 lb (67.1 kg)      Other studies Reviewed: Additional studies/ records that were reviewed today include: TEE 10/22/17  Review of the above records today demonstrates:  - Left ventricle: There was mild concentric hypertrophy. Systolic   function was mildly reduced. The estimated ejection fraction was   in the range of 45% to 50%. Diffuse hypokinesis. No evidence of   thrombus. - Aortic valve: No evidence of vegetation. - Mitral valve: There was mild regurgitation. - Left atrium: No evidence of thrombus in the atrial cavity or   appendage. No evidence of thrombus in the atrial cavity or   appendage. - Right atrium: No evidence of thrombus in the atrial cavity or   appendage. 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 This was visualized in 2D and 3D modes. - Tricuspid valve: There was mild regurgitation. - Pulmonic valve: There was trivial regurgitation.   ASSESSMENT AND PLAN:  1.  Atrial fibrillation: Status post 2 ablations, most recently 08/15/2016.  Currently on amiodarone and eliquis.  Danity Schmelzer plan to decrease amiodarone dose to 100 mg daily.  She is remained in sinus rhythm.  We Tel Hevia also check labs for amiodarone toxicity..    This patients CHA2DS2-VASc Score and unadjusted Ischemic Stroke Rate (% per year) is equal to 3.2 % stroke rate/year from a score of 3  Above score calculated as 1 point each if present [CHF, HTN, DM, Vascular=MI/PAD/Aortic Plaque, Age if 65-74, or Female] Above score calculated as 2 points each if present [Age > 75, or Stroke/TIA/TE]   2. Hypertension: Mildly elevated today but is usually quite a bit lower.  No changes.  3. Hyperlipidemia: Continue atorvastatin and Zetia  Current medicines are reviewed  at length with the patient today.   The patient does not have concerns regarding her medicines.  The following changes were made today: Decrease amiodarone  Labs/ tests ordered today include:  Orders Placed This Encounter  Procedures  . TSH  . Hepatic function panel  . EKG 12-Lead  Disposition:   FU with Bobette Leyh 6 months  Signed, Brentt Fread Meredith Leeds, MD  07/05/2019 8:14 AM     [facility] [address] [place] [address] [phone] (office) [phone] (fax)

## 2019-07-05 NOTE — Telephone Encounter (Signed)
New Message  Patient's daughter is calling to go over the appointment the patient had with Dr. Curt Bears today and to discuss the medication change. Please give patient's daughter a call back.

## 2019-07-05 NOTE — Patient Instructions (Addendum)
Medication Instructions:  Your physician has recommended you make the following change in your medication:  1. DECREASE Amiodarone to 100 mg once daily  * If you need a refill on your cardiac medications before your next appointment, please call your pharmacy.   Labwork: Amiodarone surveillance labs today: TSH & LFTs If you have labs (blood work) drawn today and your tests are completely normal, you will receive your results only by:  MyChart Message (if you have MyChart) OR  A paper copy in the mail If you have any lab test that is abnormal or we need to change your treatment, we will call you to review the results.  Testing/Procedures: None ordered  Follow-Up: At [facility], you and your health needs are our priority.  As part of our continuing mission to provide you with exceptional heart care, we have created designated Provider Care Teams.  These Care Teams include your primary Cardiologist (physician) and Advanced Practice Providers (APPs -  Physician Assistants and Nurse Practitioners) who all work together to provide you with the care you need, when you need it.  You will need a follow up appointment in 6 months.  Please call our office 2 months in advance to schedule this appointment.  You may see Dr Curt Bears or one of the following Advanced Practice Providers on your designated Care Team:    Chanetta Marshall, NP  Tommye Standard, PA-C  Oda Kilts, [place]  Thank you for choosing [facility]!!   Trinidad Curet, RN [phone]  Any Other Special Instructions Will Be Listed Below (If Applicable).

## 2019-07-06 MED ORDER — AMIODARONE HCL 100 MG PO TABS: 100.0000 mg | ORAL_TABLET | Freq: Every day | ORAL | 1 refills | Status: DC

## 2019-07-06 NOTE — Telephone Encounter (Signed)
Answered dtr's questions. Verified that Dr. Curt Bears decreased Amiodarone and rationale. Dtr verbalized understanding and appreciates my updating her being that she could not attend the visit w/ mom.

## 2019-07-07 ENCOUNTER — Telehealth: Payer: Self-pay | Admitting: Cardiology

## 2019-07-07 NOTE — Telephone Encounter (Signed)
New Message  Patient's daughter is returning call about patient's results. Please give patient a call back.

## 2019-07-07 NOTE — Telephone Encounter (Signed)
Pt's daughter aware of lab results ./cy

## 2019-07-21 DIAGNOSIS — H35033 Hypertensive retinopathy, bilateral: Secondary | ICD-10-CM | POA: Diagnosis not present

## 2019-07-21 DIAGNOSIS — M810 Age-related osteoporosis without current pathological fracture: Secondary | ICD-10-CM | POA: Diagnosis not present

## 2019-07-21 DIAGNOSIS — I480 Paroxysmal atrial fibrillation: Secondary | ICD-10-CM | POA: Diagnosis not present

## 2019-07-21 DIAGNOSIS — E7800 Pure hypercholesterolemia, unspecified: Secondary | ICD-10-CM | POA: Diagnosis not present

## 2019-07-21 DIAGNOSIS — E039 Hypothyroidism, unspecified: Secondary | ICD-10-CM | POA: Diagnosis not present

## 2019-08-02 DIAGNOSIS — R310 Gross hematuria: Secondary | ICD-10-CM | POA: Diagnosis not present

## 2019-08-04 DIAGNOSIS — E039 Hypothyroidism, unspecified: Secondary | ICD-10-CM | POA: Diagnosis not present

## 2019-08-04 DIAGNOSIS — Z79899 Other long term (current) drug therapy: Secondary | ICD-10-CM | POA: Diagnosis not present

## 2019-08-04 DIAGNOSIS — K909 Intestinal malabsorption, unspecified: Secondary | ICD-10-CM | POA: Diagnosis not present

## 2019-08-04 DIAGNOSIS — H35033 Hypertensive retinopathy, bilateral: Secondary | ICD-10-CM | POA: Diagnosis not present

## 2019-08-04 DIAGNOSIS — K9049 Malabsorption due to intolerance, not elsewhere classified: Secondary | ICD-10-CM | POA: Diagnosis not present

## 2019-08-04 DIAGNOSIS — D6869 Other thrombophilia: Secondary | ICD-10-CM | POA: Diagnosis not present

## 2019-08-04 DIAGNOSIS — K219 Gastro-esophageal reflux disease without esophagitis: Secondary | ICD-10-CM | POA: Diagnosis not present

## 2019-08-04 DIAGNOSIS — E7800 Pure hypercholesterolemia, unspecified: Secondary | ICD-10-CM | POA: Diagnosis not present

## 2019-08-04 DIAGNOSIS — I480 Paroxysmal atrial fibrillation: Secondary | ICD-10-CM | POA: Diagnosis not present

## 2019-08-04 DIAGNOSIS — Z0000 Encounter for general adult medical examination without abnormal findings: Secondary | ICD-10-CM | POA: Diagnosis not present

## 2019-08-04 DIAGNOSIS — Z1389 Encounter for screening for other disorder: Secondary | ICD-10-CM | POA: Diagnosis not present

## 2019-08-04 DIAGNOSIS — Z23 Encounter for immunization: Secondary | ICD-10-CM | POA: Diagnosis not present

## 2019-08-10 DIAGNOSIS — M810 Age-related osteoporosis without current pathological fracture: Secondary | ICD-10-CM | POA: Diagnosis not present

## 2019-08-10 DIAGNOSIS — I480 Paroxysmal atrial fibrillation: Secondary | ICD-10-CM | POA: Diagnosis not present

## 2019-08-10 DIAGNOSIS — E7800 Pure hypercholesterolemia, unspecified: Secondary | ICD-10-CM | POA: Diagnosis not present

## 2019-08-10 DIAGNOSIS — E039 Hypothyroidism, unspecified: Secondary | ICD-10-CM | POA: Diagnosis not present

## 2019-08-10 DIAGNOSIS — H35033 Hypertensive retinopathy, bilateral: Secondary | ICD-10-CM | POA: Diagnosis not present

## 2019-08-24 DIAGNOSIS — N390 Urinary tract infection, site not specified: Secondary | ICD-10-CM | POA: Diagnosis not present

## 2019-08-25 DIAGNOSIS — S40012A Contusion of left shoulder, initial encounter: Secondary | ICD-10-CM | POA: Diagnosis not present

## 2019-08-25 DIAGNOSIS — S60212A Contusion of left wrist, initial encounter: Secondary | ICD-10-CM | POA: Diagnosis not present

## 2019-08-25 DIAGNOSIS — H35033 Hypertensive retinopathy, bilateral: Secondary | ICD-10-CM | POA: Diagnosis not present

## 2019-08-25 DIAGNOSIS — H40013 Open angle with borderline findings, low risk, bilateral: Secondary | ICD-10-CM | POA: Diagnosis not present

## 2019-08-25 DIAGNOSIS — S5002XA Contusion of left elbow, initial encounter: Secondary | ICD-10-CM | POA: Diagnosis not present

## 2019-08-25 DIAGNOSIS — Z961 Presence of intraocular lens: Secondary | ICD-10-CM | POA: Diagnosis not present

## 2019-08-25 DIAGNOSIS — H04123 Dry eye syndrome of bilateral lacrimal glands: Secondary | ICD-10-CM | POA: Diagnosis not present

## 2019-09-02 DIAGNOSIS — I480 Paroxysmal atrial fibrillation: Secondary | ICD-10-CM | POA: Diagnosis not present

## 2019-09-02 DIAGNOSIS — E039 Hypothyroidism, unspecified: Secondary | ICD-10-CM | POA: Diagnosis not present

## 2019-09-02 DIAGNOSIS — E7800 Pure hypercholesterolemia, unspecified: Secondary | ICD-10-CM | POA: Diagnosis not present

## 2019-09-02 DIAGNOSIS — M810 Age-related osteoporosis without current pathological fracture: Secondary | ICD-10-CM | POA: Diagnosis not present

## 2019-09-02 DIAGNOSIS — H35033 Hypertensive retinopathy, bilateral: Secondary | ICD-10-CM | POA: Diagnosis not present

## 2019-10-11 DIAGNOSIS — E7800 Pure hypercholesterolemia, unspecified: Secondary | ICD-10-CM | POA: Diagnosis not present

## 2019-10-11 DIAGNOSIS — E039 Hypothyroidism, unspecified: Secondary | ICD-10-CM | POA: Diagnosis not present

## 2019-10-11 DIAGNOSIS — H35033 Hypertensive retinopathy, bilateral: Secondary | ICD-10-CM | POA: Diagnosis not present

## 2019-10-11 DIAGNOSIS — M810 Age-related osteoporosis without current pathological fracture: Secondary | ICD-10-CM | POA: Diagnosis not present

## 2019-10-11 DIAGNOSIS — I480 Paroxysmal atrial fibrillation: Secondary | ICD-10-CM | POA: Diagnosis not present

## 2019-10-18 DIAGNOSIS — S92355A Nondisplaced fracture of fifth metatarsal bone, left foot, initial encounter for closed fracture: Secondary | ICD-10-CM | POA: Diagnosis not present

## 2019-10-18 DIAGNOSIS — S92302A Fracture of unspecified metatarsal bone(s), left foot, initial encounter for closed fracture: Secondary | ICD-10-CM | POA: Diagnosis not present

## 2019-10-18 DIAGNOSIS — S92352A Displaced fracture of fifth metatarsal bone, left foot, initial encounter for closed fracture: Secondary | ICD-10-CM | POA: Diagnosis not present

## 2019-10-25 DIAGNOSIS — M79672 Pain in left foot: Secondary | ICD-10-CM | POA: Diagnosis not present

## 2019-10-25 DIAGNOSIS — S92355D Nondisplaced fracture of fifth metatarsal bone, left foot, subsequent encounter for fracture with routine healing: Secondary | ICD-10-CM | POA: Diagnosis not present

## 2019-11-07 DIAGNOSIS — M810 Age-related osteoporosis without current pathological fracture: Secondary | ICD-10-CM | POA: Diagnosis not present

## 2019-11-07 DIAGNOSIS — E039 Hypothyroidism, unspecified: Secondary | ICD-10-CM | POA: Diagnosis not present

## 2019-11-07 DIAGNOSIS — H35033 Hypertensive retinopathy, bilateral: Secondary | ICD-10-CM | POA: Diagnosis not present

## 2019-11-07 DIAGNOSIS — E7800 Pure hypercholesterolemia, unspecified: Secondary | ICD-10-CM | POA: Diagnosis not present

## 2019-11-07 DIAGNOSIS — I480 Paroxysmal atrial fibrillation: Secondary | ICD-10-CM | POA: Diagnosis not present

## 2019-11-15 DIAGNOSIS — M79672 Pain in left foot: Secondary | ICD-10-CM | POA: Diagnosis not present

## 2019-11-15 DIAGNOSIS — S92355D Nondisplaced fracture of fifth metatarsal bone, left foot, subsequent encounter for fracture with routine healing: Secondary | ICD-10-CM | POA: Diagnosis not present

## 2019-11-16 DIAGNOSIS — N3090 Cystitis, unspecified without hematuria: Secondary | ICD-10-CM | POA: Diagnosis not present

## 2019-11-16 DIAGNOSIS — R300 Dysuria: Secondary | ICD-10-CM | POA: Diagnosis not present

## 2019-11-23 DIAGNOSIS — M810 Age-related osteoporosis without current pathological fracture: Secondary | ICD-10-CM | POA: Diagnosis not present

## 2019-11-23 DIAGNOSIS — I499 Cardiac arrhythmia, unspecified: Secondary | ICD-10-CM | POA: Diagnosis not present

## 2019-11-23 DIAGNOSIS — M797 Fibromyalgia: Secondary | ICD-10-CM | POA: Diagnosis not present

## 2019-11-23 DIAGNOSIS — M179 Osteoarthritis of knee, unspecified: Secondary | ICD-10-CM | POA: Diagnosis not present

## 2019-11-23 DIAGNOSIS — E785 Hyperlipidemia, unspecified: Secondary | ICD-10-CM | POA: Diagnosis not present

## 2019-11-30 DIAGNOSIS — S92302G Fracture of unspecified metatarsal bone(s), left foot, subsequent encounter for fracture with delayed healing: Secondary | ICD-10-CM | POA: Diagnosis not present

## 2019-11-30 DIAGNOSIS — M79672 Pain in left foot: Secondary | ICD-10-CM | POA: Diagnosis not present

## 2019-12-06 DIAGNOSIS — H35033 Hypertensive retinopathy, bilateral: Secondary | ICD-10-CM | POA: Diagnosis not present

## 2019-12-06 DIAGNOSIS — M810 Age-related osteoporosis without current pathological fracture: Secondary | ICD-10-CM | POA: Diagnosis not present

## 2019-12-06 DIAGNOSIS — E039 Hypothyroidism, unspecified: Secondary | ICD-10-CM | POA: Diagnosis not present

## 2019-12-06 DIAGNOSIS — E7800 Pure hypercholesterolemia, unspecified: Secondary | ICD-10-CM | POA: Diagnosis not present

## 2019-12-06 DIAGNOSIS — I480 Paroxysmal atrial fibrillation: Secondary | ICD-10-CM | POA: Diagnosis not present

## 2019-12-21 DIAGNOSIS — M79671 Pain in right foot: Secondary | ICD-10-CM | POA: Diagnosis not present

## 2019-12-21 DIAGNOSIS — S92355G Nondisplaced fracture of fifth metatarsal bone, left foot, subsequent encounter for fracture with delayed healing: Secondary | ICD-10-CM | POA: Diagnosis not present

## 2019-12-21 DIAGNOSIS — M79672 Pain in left foot: Secondary | ICD-10-CM | POA: Diagnosis not present

## 2020-01-04 DIAGNOSIS — I480 Paroxysmal atrial fibrillation: Secondary | ICD-10-CM | POA: Diagnosis not present

## 2020-01-04 DIAGNOSIS — M810 Age-related osteoporosis without current pathological fracture: Secondary | ICD-10-CM | POA: Diagnosis not present

## 2020-01-04 DIAGNOSIS — H35033 Hypertensive retinopathy, bilateral: Secondary | ICD-10-CM | POA: Diagnosis not present

## 2020-01-04 DIAGNOSIS — E7800 Pure hypercholesterolemia, unspecified: Secondary | ICD-10-CM | POA: Diagnosis not present

## 2020-01-04 DIAGNOSIS — E039 Hypothyroidism, unspecified: Secondary | ICD-10-CM | POA: Diagnosis not present

## 2020-02-02 DIAGNOSIS — M810 Age-related osteoporosis without current pathological fracture: Secondary | ICD-10-CM | POA: Diagnosis not present

## 2020-02-03 DIAGNOSIS — M25562 Pain in left knee: Secondary | ICD-10-CM | POA: Diagnosis not present

## 2020-02-03 DIAGNOSIS — H35033 Hypertensive retinopathy, bilateral: Secondary | ICD-10-CM | POA: Diagnosis not present

## 2020-02-03 DIAGNOSIS — R7303 Prediabetes: Secondary | ICD-10-CM | POA: Diagnosis not present

## 2020-02-03 DIAGNOSIS — R269 Unspecified abnormalities of gait and mobility: Secondary | ICD-10-CM | POA: Diagnosis not present

## 2020-02-03 DIAGNOSIS — D6869 Other thrombophilia: Secondary | ICD-10-CM | POA: Diagnosis not present

## 2020-02-03 DIAGNOSIS — Z79899 Other long term (current) drug therapy: Secondary | ICD-10-CM | POA: Diagnosis not present

## 2020-02-03 DIAGNOSIS — D649 Anemia, unspecified: Secondary | ICD-10-CM | POA: Diagnosis not present

## 2020-02-03 DIAGNOSIS — I480 Paroxysmal atrial fibrillation: Secondary | ICD-10-CM | POA: Diagnosis not present

## 2020-02-03 DIAGNOSIS — R413 Other amnesia: Secondary | ICD-10-CM | POA: Diagnosis not present

## 2020-02-08 DIAGNOSIS — I480 Paroxysmal atrial fibrillation: Secondary | ICD-10-CM | POA: Diagnosis not present

## 2020-02-08 DIAGNOSIS — D6869 Other thrombophilia: Secondary | ICD-10-CM | POA: Diagnosis not present

## 2020-02-08 DIAGNOSIS — D509 Iron deficiency anemia, unspecified: Secondary | ICD-10-CM | POA: Diagnosis not present

## 2020-02-08 DIAGNOSIS — R7303 Prediabetes: Secondary | ICD-10-CM | POA: Diagnosis not present

## 2020-03-01 DIAGNOSIS — I480 Paroxysmal atrial fibrillation: Secondary | ICD-10-CM | POA: Diagnosis not present

## 2020-03-01 DIAGNOSIS — E039 Hypothyroidism, unspecified: Secondary | ICD-10-CM | POA: Diagnosis not present

## 2020-03-01 DIAGNOSIS — D509 Iron deficiency anemia, unspecified: Secondary | ICD-10-CM | POA: Diagnosis not present

## 2020-03-01 DIAGNOSIS — H35033 Hypertensive retinopathy, bilateral: Secondary | ICD-10-CM | POA: Diagnosis not present

## 2020-03-02 DIAGNOSIS — D509 Iron deficiency anemia, unspecified: Secondary | ICD-10-CM | POA: Diagnosis not present

## 2020-03-02 DIAGNOSIS — Z86010 Personal history of colonic polyps: Secondary | ICD-10-CM | POA: Diagnosis not present

## 2020-03-02 DIAGNOSIS — R0689 Other abnormalities of breathing: Secondary | ICD-10-CM | POA: Diagnosis not present

## 2020-03-02 DIAGNOSIS — K219 Gastro-esophageal reflux disease without esophagitis: Secondary | ICD-10-CM | POA: Diagnosis not present

## 2020-03-02 DIAGNOSIS — R1013 Epigastric pain: Secondary | ICD-10-CM | POA: Diagnosis not present

## 2020-03-05 ENCOUNTER — Other Ambulatory Visit: Payer: Self-pay | Admitting: Physician Assistant

## 2020-03-05 DIAGNOSIS — R1013 Epigastric pain: Secondary | ICD-10-CM

## 2020-03-05 DIAGNOSIS — D509 Iron deficiency anemia, unspecified: Secondary | ICD-10-CM | POA: Diagnosis not present

## 2020-03-13 ENCOUNTER — Other Ambulatory Visit: Payer: Self-pay

## 2020-03-13 ENCOUNTER — Ambulatory Visit (INDEPENDENT_AMBULATORY_CARE_PROVIDER_SITE_OTHER): Payer: Medicare Other | Admitting: Cardiology

## 2020-03-13 ENCOUNTER — Encounter: Payer: Self-pay | Admitting: Cardiology

## 2020-03-13 VITALS — BP 120/70 | HR 53 | Ht 63.0 in | Wt 160.0 lb

## 2020-03-13 DIAGNOSIS — I4819 Other persistent atrial fibrillation: Secondary | ICD-10-CM

## 2020-03-13 NOTE — Progress Notes (Signed)
Electrophysiology Office Note   Date:  03/13/2020   ID:  Chelsea Hancock, DOB [birth year], MRN [id number]  PCP:  Lajean Manes, MD  Cardiologist:  [name] Primary Electrophysiologist:  Constance Haw, MD    No chief complaint on file.    History of Present Illness: Chelsea Hancock is a 86 y.o. female who presents today for electrophysiology evaluation.   She had an ablation for atrial fib/flutter on 07/2015. Repeat ablation scheduled for 08/15/16. Since that time, she is felt well without major complaint. She did yard work last week without any major problems.  She reverted back to atrial fibrillation and had a repeat cardioversion on 10/22/17.  TEE showed an ASD.  Review of cardiac CT done prior to ablation showed no evidence of ASD.  Today, denies symptoms of palpitations, chest pain, shortness of breath, orthopnea, PND, lower extremity edema, claudication, dizziness, presyncope, syncope, bleeding, or neurologic sequela. The patient is tolerating medications without difficulties.  Overall she is doing well.  She has no chest pain or shortness of breath.  Her main complaint today is of fatigue.  This is been occurring over the last few months.  She is also anemic and is having a work-up for that done by her primary physician.  Her primary physician also heard crackles in her lungs and has a chest x-ray planned for tomorrow.  Past Medical History:  Diagnosis Date  . Atrial fibrillation and flutter ([place])    a. s/p PVI ablation in 07/2015 with continue PAF  b. s/p repeat ablation on 08/15/16. continued on Flecainide 100mg  BID  . Carotid bruit    Right  . Chronic back pain   . Depression with anxiety 03/29/2013  . Diverticulosis   . Fibromyalgia   . Foot drop, right   . GERD (gastroesophageal reflux disease)   . Hemorrhoids   . HTN (hypertension)   . Hyperlipidemia   . IBS (irritable bowel syndrome)   . Memory loss 02/11/2015  . Onychomycosis 07/03/2013  .  Osteoporosis   . Sigmoid colon ulcer 03/23/2012   Past Surgical History:  Procedure Laterality Date  . ABDOMINAL HYSTERECTOMY  1981  . BACK SURGERY  1997   reconstructive lower back surgery  . CARDIOVERSION N/A 07/01/2016   Procedure: CARDIOVERSION;  Surgeon: Thayer Headings, MD;  Location: [place];  Service: Cardiovascular;  Laterality: N/A;  . CARDIOVERSION N/A 10/22/2017   Procedure: CARDIOVERSION;  Surgeon: Pixie Casino, MD;  Location: [facility] ENDOSCOPY;  Service: Cardiovascular;  Laterality: N/A;  . CARPAL TUNNEL RELEASE Left 2012   ulnar nerve release at elbow  . CERVICAL FUSION  2003, 2005, 2013  . COLONOSCOPY    . ELECTROPHYSIOLOGIC STUDY N/A 08/10/2015   Procedure: Afib;  Surgeon: Mozes Sagar Meredith Leeds, MD;  Location: [place] CV LAB;  Service: Cardiovascular;  Laterality: N/A;  . ELECTROPHYSIOLOGIC STUDY N/A 08/15/2016   Procedure: Atrial Fibrillation Ablation;  Surgeon: Felicia Both Meredith Leeds, MD;  Location: [place] CV LAB;  Service: Cardiovascular;  Laterality: N/A;  . EYE SURGERY  2015   b/l cataracts removed  . left elbow surgery  06/03/11   cyst removed  . LUMBAR LAMINECTOMY  1960   x 2  . POSTERIOR CERVICAL FUSION/FORAMINOTOMY  08/03/2012   Procedure: POSTERIOR CERVICAL FUSION/FORAMINOTOMY LEVEL 5;  Surgeon: [name], MD;  Location: [place] NEURO ORS;  Service: Neurosurgery;  Laterality: N/A;  Cervical four to Thoracic two Posterior cervical decompression with Facet and Pedicle screw fixation  . TEE WITHOUT CARDIOVERSION N/A  10/22/2017   Procedure: TRANSESOPHAGEAL ECHOCARDIOGRAM (TEE);  Surgeon: Pixie Casino, MD;  Location: [place] ENDOSCOPY;  Service: Cardiovascular;  Laterality: N/A;  . TONSILLECTOMY  1943  . TOTAL HIP ARTHROPLASTY Left 2012  . TOTAL KNEE ARTHROPLASTY Right 11/20/2014   Procedure: RIGHT TOTAL KNEE ARTHROPLASTY;  Surgeon: [name], MD;  Location: [facility];  Service: Orthopedics;  Laterality: Right;  . veins stripped  1970  . WRIST SURGERY Right  1990's   cyst removed     Current Outpatient Medications  Medication Sig Dispense Refill  . amiodarone (PACERONE) 100 MG tablet Take 1 tablet (100 mg total) by mouth daily. 90 tablet 1  . atorvastatin (LIPITOR) 10 MG tablet Take 10 mg by mouth daily.    . Calcium Carb-Cholecalciferol 600-200 MG-UNIT TABS Take 1 tablet by mouth daily.    . clobetasol (TEMOVATE) 0.05 % GEL APPLY TO THE AFFECTED AREA OF SKIN ON ARMS TWICE DAILY  0  . denosumab (PROLIA) 60 MG/ML SOSY injection Inject 60 mg into the skin every 6 (six) months.    Marland Kitchen ELIQUIS 5 MG TABS tablet Take 1 tablet by mouth twice daily. 60 tablet 3  . esomeprazole (NEXIUM) 40 MG capsule TAKE ONE CAPSULE BY MOUTH EVERY DAY AT 12 NOON 90 capsule 2  . eszopiclone (LUNESTA) 2 MG TABS tablet Take 1 tablet (2 mg total) by mouth at bedtime as needed for sleep. Take immediately before bedtime (Patient taking differently: Take 2 mg by mouth at bedtime. Take immediately before bedtime) 30 tablet 2  . ezetimibe (ZETIA) 10 MG tablet TAKE 10 MG BY MOUTH AT BEDTIME 30 tablet 6  . ferrous sulfate 325 (65 FE) MG tablet Take 1 tablet by mouth daily.    Marland Kitchen gabapentin (NEURONTIN) 600 MG tablet TAKE 1 TABLET(600 MG) BY MOUTH TWICE DAILY 180 tablet 0  . hydrOXYzine (ATARAX/VISTARIL) 10 MG tablet Take 10-20 mg by mouth at bedtime as needed for itching (sleep).    Marland Kitchen levothyroxine (SYNTHROID) 50 MCG tablet Take 50 mcg by mouth daily before breakfast.    . Melatonin 10 MG CAPS Take 10 mg by mouth at bedtime.    . Multiple Vitamins-Minerals (CENTRUM SILVER ULTRA WOMENS) TABS Take 1 tablet by mouth daily.    . nortriptyline (PAMELOR) 50 MG capsule Take 100 mg by mouth at bedtime.    Vladimir Faster Glycol-Propyl Glycol (SYSTANE OP) Place 1 drop into both eyes daily as needed (for dry eyes).     No current facility-administered medications for this visit.    Allergies:   Cefuroxime axetil, Macrodantin, Alendronate sodium, Codeine, Demerol [meperidine], Meperidine hcl,  Other, Penicillins, and Sulfonamide derivatives   Social History:  The patient  reports that she has quit smoking. Her smoking use included cigarettes. She has a 1.00 pack-year smoking history. She has never used smokeless tobacco. She reports current alcohol use. She reports that she does not use drugs.   Family History:  The patient's family history includes Anxiety disorder in her mother; Bipolar disorder in her mother; Colon cancer in her father; HIV/AIDS in her son; Healthy in her daughter; Heart disease in her maternal grandmother; Mental illness in her mother; Stomach cancer in her maternal grandfather, mother, and paternal grandmother; Stroke in her father; Throat cancer in her maternal uncle.   ROS:  Please see the history of present illness.   Otherwise, review of systems is positive for none.   All other systems are reviewed and negative.   PHYSICAL EXAM: VS:  BP 120/70  Pulse (!) 53   Ht 5\' 3"  (1.6 m)   Wt 160 lb (72.6 kg)   SpO2 95%   BMI 28.34 kg/m  , BMI Body mass index is 28.34 kg/m. GEN: Well nourished, well developed, in no acute distress  HEENT: normal  Neck: no JVD, carotid bruits, or masses Cardiac: RRR; no murmurs, rubs, or gallops,no edema  Respiratory:  clear to auscultation bilaterally, normal work of breathing GI: soft, nontender, nondistended, + BS MS: no deformity or atrophy  Skin: warm and dry Neuro:  Strength and sensation are intact Psych: euthymic mood, full affect  EKG:  EKG is ordered today. Personal review of the ekg ordered shows sinus rhythm, rate 53, first-degree AV block  Recent Labs: 04/16/2019: BUN 22; Creatinine, Ser 1.04; Hemoglobin 12.4; Platelets 196; Potassium 4.0; Sodium 136 07/05/2019: ALT 22; TSH 6.620    Lipid Panel     Component Value Date/Time   CHOL 161 04/16/2019 0352   TRIG 120 04/16/2019 0352   TRIG 133 06/30/2006 1110   HDL 53 04/16/2019 0352   CHOLHDL 3.0 04/16/2019 0352   VLDL 24 04/16/2019 0352   LDLCALC 84  04/16/2019 0352   LDLDIRECT 93.3 08/04/2008 0915     Wt Readings from Last 3 Encounters:  03/13/20 160 lb (72.6 kg)  07/05/19 150 lb 6.4 oz (68.2 kg)  04/16/19 147 lb 3.2 oz (66.8 kg)      Other studies Reviewed: Additional studies/ records that were reviewed today include: TEE 10/22/17  Review of the above records today demonstrates:  - Left ventricle: There was mild concentric hypertrophy. Systolic   function was mildly reduced. The estimated ejection fraction was   in the range of 45% to 50%. Diffuse hypokinesis. No evidence of   thrombus. - Aortic valve: No evidence of vegetation. - Mitral valve: There was mild regurgitation. - Left atrium: No evidence of thrombus in the atrial cavity or   appendage. No evidence of thrombus in the atrial cavity or   appendage. - Right atrium: No evidence of thrombus in the atrial cavity or   appendage. 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 This was visualized in 2D and 3D modes. - Tricuspid valve: There was mild regurgitation. - Pulmonic valve: There was trivial regurgitation.   ASSESSMENT AND PLAN:  1.  Atrial fibrillation: Status post 2 ablations, most recently 08/15/2016.  Currently on amiodarone and Eliquis.  CHA2DS2-VASc of 3.  We Babe Anthis continue to monitor amiodarone as a high risk medication for toxicity with hepatic panel, I controlled heart rate TSH today..  She is having weakness and fatigue which could be due to her bisoprolol.  We Miyo Aina plan to stop it today.  2. Hypertension: Currently well controlled  3. Hyperlipidemia: Continue atorvastatin and Zetia  Current medicines are reviewed at length with the patient today.   The patient does not have concerns regarding her medicines.  The following changes  were made today: None  Labs/ tests ordered today include:  Orders Placed This Encounter  Procedures  . EKG 12-Lead     Disposition:   FU with Nancyann Cotterman 6 months  Signed, Amayah Staheli Meredith Leeds, MD  03/13/2020 11:13 AM     [facility] [address] [place]  [address] [phone] (office) [phone] (fax)

## 2020-03-13 NOTE — Patient Instructions (Signed)
Medication Instructions:  Your physician has recommended you make the following change in your medication:  1. STOP Bisoprolol  *If you need a refill on your cardiac medications before your next appointment, please call your pharmacy*   Lab Work: None ordered If you have labs (blood work) drawn today and your tests are completely normal, you will receive your results only by: Marland Kitchen MyChart Message (if you have MyChart) OR . A paper copy in the mail If you have any lab test that is abnormal or we need to change your treatment, we will call you to review the results.   Testing/Procedures: None ordered   Follow-Up: At [facility], you and your health needs are our priority.  As part of our continuing mission to provide you with exceptional heart care, we have created designated Provider Care Teams.  These Care Teams include your primary Cardiologist (physician) and Advanced Practice Providers (APPs -  Physician Assistants and Nurse Practitioners) who all work together to provide you with the care you need, when you need it.  We recommend signing up for the patient portal called "MyChart".  Sign up information is provided on this After Visit Summary.  MyChart is used to connect with patients for Virtual Visits (Telemedicine).  Patients are able to view lab/test results, encounter notes, upcoming appointments, etc.  Non-urgent messages can be sent to your provider as well.   To learn more about what you can do with MyChart, go to [email or web address].    Your next appointment:   6 month(s)  The format for your next appointment:   In Person  Provider:   Allegra Lai, MD   Thank you for choosing [facility]!!   Trinidad Curet, RN [phone]    Other Instructions

## 2020-03-14 ENCOUNTER — Ambulatory Visit
Admission: RE | Admit: 2020-03-14 | Discharge: 2020-03-14 | Disposition: A | Discharge status: Home / Self Care | Payer: Medicare Other | Source: Ambulatory Visit | Attending: Physician Assistant | Admitting: Physician Assistant

## 2020-03-14 ENCOUNTER — Other Ambulatory Visit: Payer: Self-pay | Admitting: Physician Assistant

## 2020-03-14 DIAGNOSIS — R1013 Epigastric pain: Secondary | ICD-10-CM

## 2020-03-28 DIAGNOSIS — M25562 Pain in left knee: Secondary | ICD-10-CM | POA: Diagnosis not present

## 2020-04-02 DIAGNOSIS — M25562 Pain in left knee: Secondary | ICD-10-CM | POA: Diagnosis not present

## 2020-04-05 DIAGNOSIS — M25562 Pain in left knee: Secondary | ICD-10-CM | POA: Diagnosis not present

## 2020-04-08 DIAGNOSIS — H35033 Hypertensive retinopathy, bilateral: Secondary | ICD-10-CM | POA: Diagnosis not present

## 2020-04-08 DIAGNOSIS — I480 Paroxysmal atrial fibrillation: Secondary | ICD-10-CM | POA: Diagnosis not present

## 2020-04-08 DIAGNOSIS — D509 Iron deficiency anemia, unspecified: Secondary | ICD-10-CM | POA: Diagnosis not present

## 2020-04-08 DIAGNOSIS — E039 Hypothyroidism, unspecified: Secondary | ICD-10-CM | POA: Diagnosis not present

## 2020-04-10 DIAGNOSIS — M25562 Pain in left knee: Secondary | ICD-10-CM | POA: Diagnosis not present

## 2020-04-12 DIAGNOSIS — M25562 Pain in left knee: Secondary | ICD-10-CM | POA: Diagnosis not present

## 2020-04-17 DIAGNOSIS — M25562 Pain in left knee: Secondary | ICD-10-CM | POA: Diagnosis not present

## 2020-04-18 DIAGNOSIS — D509 Iron deficiency anemia, unspecified: Secondary | ICD-10-CM | POA: Diagnosis not present

## 2020-04-19 ENCOUNTER — Ambulatory Visit (INDEPENDENT_AMBULATORY_CARE_PROVIDER_SITE_OTHER): Payer: Medicare Other | Admitting: Podiatry

## 2020-04-19 ENCOUNTER — Other Ambulatory Visit: Payer: Self-pay

## 2020-04-19 DIAGNOSIS — B351 Tinea unguium: Secondary | ICD-10-CM | POA: Diagnosis not present

## 2020-04-19 DIAGNOSIS — M79674 Pain in right toe(s): Secondary | ICD-10-CM | POA: Diagnosis not present

## 2020-04-19 DIAGNOSIS — M79675 Pain in left toe(s): Secondary | ICD-10-CM | POA: Diagnosis not present

## 2020-04-19 DIAGNOSIS — Z7901 Long term (current) use of anticoagulants: Secondary | ICD-10-CM

## 2020-04-19 DIAGNOSIS — M25562 Pain in left knee: Secondary | ICD-10-CM | POA: Diagnosis not present

## 2020-04-19 DIAGNOSIS — L600 Ingrowing nail: Secondary | ICD-10-CM | POA: Diagnosis not present

## 2020-04-23 DIAGNOSIS — M25562 Pain in left knee: Secondary | ICD-10-CM | POA: Diagnosis not present

## 2020-04-23 NOTE — Progress Notes (Signed)
Subjective:   Patient ID: Chelsea Hancock, female   DOB: 86 y.o.   MRN: [id number]   HPI 86 year old female presents the office today for concerns of nails becoming thick, discolored, elongated she cannot trim her self and she is currently on Eliquis.  Also the right toenails become sore at the corners and starting to get ingrown but denies any redness or drainage or any swelling.  She has no other concerns today.   Review of Systems  All other systems reviewed and are negative.  Past Medical History:  Diagnosis Date  . Atrial fibrillation and flutter ([place])    a. s/p PVI ablation in 07/2015 with continue PAF  b. s/p repeat ablation on 08/15/16. continued on Flecainide 100mg  BID  . Carotid bruit    Right  . Chronic back pain   . Depression with anxiety 03/29/2013  . Diverticulosis   . Fibromyalgia   . Foot drop, right   . GERD (gastroesophageal reflux disease)   . Hemorrhoids   . HTN (hypertension)   . Hyperlipidemia   . IBS (irritable bowel syndrome)   . Memory loss 02/11/2015  . Onychomycosis 07/03/2013  . Osteoporosis   . Sigmoid colon ulcer 03/23/2012    Past Surgical History:  Procedure Laterality Date  . ABDOMINAL HYSTERECTOMY  1981  . BACK SURGERY  1997   reconstructive lower back surgery  . CARDIOVERSION N/A 07/01/2016   Procedure: CARDIOVERSION;  Surgeon: Thayer Headings, MD;  Location: [place];  Service: Cardiovascular;  Laterality: N/A;  . CARDIOVERSION N/A 10/22/2017   Procedure: CARDIOVERSION;  Surgeon: Pixie Casino, MD;  Location: [facility] ENDOSCOPY;  Service: Cardiovascular;  Laterality: N/A;  . CARPAL TUNNEL RELEASE Left 2012   ulnar nerve release at elbow  . CERVICAL FUSION  2003, 2005, 2013  . COLONOSCOPY    . ELECTROPHYSIOLOGIC STUDY N/A 08/10/2015   Procedure: Afib;  Surgeon: Will Meredith Leeds, MD;  Location: [place] CV LAB;  Service: Cardiovascular;  Laterality: N/A;  . ELECTROPHYSIOLOGIC STUDY N/A 08/15/2016   Procedure: Atrial  Fibrillation Ablation;  Surgeon: Will Meredith Leeds, MD;  Location: [place] CV LAB;  Service: Cardiovascular;  Laterality: N/A;  . EYE SURGERY  2015   b/l cataracts removed  . left elbow surgery  06/03/11   cyst removed  . LUMBAR LAMINECTOMY  1960   x 2  . POSTERIOR CERVICAL FUSION/FORAMINOTOMY  08/03/2012   Procedure: POSTERIOR CERVICAL FUSION/FORAMINOTOMY LEVEL 5;  Surgeon: [name], MD;  Location: [place] NEURO ORS;  Service: Neurosurgery;  Laterality: N/A;  Cervical four to Thoracic two Posterior cervical decompression with Facet and Pedicle screw fixation  . TEE WITHOUT CARDIOVERSION N/A 10/22/2017   Procedure: TRANSESOPHAGEAL ECHOCARDIOGRAM (TEE);  Surgeon: Pixie Casino, MD;  Location: [facility] ENDOSCOPY;  Service: Cardiovascular;  Laterality: N/A;  . TONSILLECTOMY  1943  . TOTAL HIP ARTHROPLASTY Left 2012  . TOTAL KNEE ARTHROPLASTY Right 11/20/2014   Procedure: RIGHT TOTAL KNEE ARTHROPLASTY;  Surgeon: [name], MD;  Location: [facility] ORS;  Service: Orthopedics;  Laterality: Right;  . veins stripped  1970  . WRIST SURGERY Right 1990's   cyst removed     Current Outpatient Medications:  .  amiodarone (PACERONE) 100 MG tablet, Take 1 tablet (100 mg total) by mouth daily., Disp: 90 tablet, Rfl: 1 .  amiodarone (PACERONE) 200 MG tablet, Take by mouth., Disp: , Rfl:  .  amoxicillin-clavulanate (AUGMENTIN) 875-125 MG tablet, amoxicillin 875 mg-potassium clavulanate 125 mg tablet  TAKE 1 TABLET BY MOUTH  TWICE DAILY WITH MEALS FOR 7 DAYS, Disp: , Rfl:  .  atorvastatin (LIPITOR) 10 MG tablet, Take 10 mg by mouth daily., Disp: , Rfl:  .  Calcium Carb-Cholecalciferol 600-200 MG-UNIT TABS, Take 1 tablet by mouth daily., Disp: , Rfl:  .  clobetasol (TEMOVATE) 0.05 % GEL, APPLY TO THE AFFECTED AREA OF SKIN ON ARMS TWICE DAILY, Disp: , Rfl: 0 .  denosumab (PROLIA) 60 MG/ML SOSY injection, Inject 60 mg into the skin every 6 (six) months., Disp: , Rfl:  .  ELIQUIS 5 MG TABS tablet, Take 1 tablet  by mouth twice daily., Disp: 60 tablet, Rfl: 3 .  esomeprazole (NEXIUM) 40 MG capsule, TAKE ONE CAPSULE BY MOUTH EVERY DAY AT 12 NOON, Disp: 90 capsule, Rfl: 2 .  eszopiclone (LUNESTA) 2 MG TABS tablet, Take 1 tablet (2 mg total) by mouth at bedtime as needed for sleep. Take immediately before bedtime (Patient taking differently: Take 2 mg by mouth at bedtime. Take immediately before bedtime), Disp: 30 tablet, Rfl: 2 .  ezetimibe (ZETIA) 10 MG tablet, TAKE 10 MG BY MOUTH AT BEDTIME, Disp: 30 tablet, Rfl: 6 .  ferrous sulfate 325 (65 FE) MG tablet, Take 1 tablet by mouth daily., Disp: , Rfl:  .  gabapentin (NEURONTIN) 600 MG tablet, TAKE 1 TABLET(600 MG) BY MOUTH TWICE DAILY, Disp: 180 tablet, Rfl: 0 .  hydrOXYzine (ATARAX/VISTARIL) 10 MG tablet, Take 10-20 mg by mouth at bedtime as needed for itching (sleep)., Disp: , Rfl:  .  levothyroxine (SYNTHROID) 50 MCG tablet, Take 50 mcg by mouth daily before breakfast., Disp: , Rfl:  .  Melatonin 10 MG CAPS, Take 10 mg by mouth at bedtime., Disp: , Rfl:  .  Melatonin 10 MG TABS, TAKE ONE TABLET BY MOUTH EVERYDAY AT BEDTIME WITH FOOD, Disp: , Rfl:  .  Multiple Vitamins-Minerals (CENTRUM SILVER ULTRA WOMENS) TABS, Take 1 tablet by mouth daily., Disp: , Rfl:  .  nortriptyline (PAMELOR) 50 MG capsule, Take 100 mg by mouth at bedtime., Disp: , Rfl:  .  Polyethyl Glycol-Propyl Glycol (SYSTANE OP), Place 1 drop into both eyes daily as needed (for dry eyes)., Disp: , Rfl:   Allergies  Allergen Reactions  . Cefuroxime Axetil Anaphylaxis  . Macrodantin Anaphylaxis  . Alendronate Sodium Other (See Comments)    Severe chest pain similar to heart attack   . Codeine Nausea And Vomiting  . Demerol [Meperidine] Other (See Comments)    hallucinations  . Meperidine Hcl Nausea And Vomiting  . Other Other (See Comments)    Hismanal and Maprobamate portobello mushrooms - diarrhea  . Penicillins Swelling and Other (See Comments)    Has patient had a PCN reaction  causing immediate rash, facial/tongue/throat swelling, SOB or lightheadedness with hypotension: Yes Has patient had a PCN reaction causing severe rash involving mucus membranes or skin necrosis: No Has patient had a PCN reaction that required hospitalization: Yes Has patient had a PCN reaction occurring within the last 10 years: Yes If all of the above answers are "NO", then may proceed with Cephalosporin use.   . Sulfonamide Derivatives Hives, Itching and Swelling         Objective:  Physical Exam  General: AAO x3, NAD  Dermatological: Nails are hypertrophic, dystrophic, brittle, discolored, elongated 10.  In particular the right hallux is incurvated the distal medial, lateral nail borders with tenderness.  No surrounding redness or drainage. Tenderness nails 1-5 bilaterally. No open lesions or pre-ulcerative lesions are identified today.  Vascular: Dorsalis Pedis artery and Posterior Tibial artery pedal pulses are palpable bilateral with immedate capillary fill time. There is no pain with calf compression, swelling, warmth, erythema.   Neruologic: Grossly intact via light touch bilateral.   Musculoskeletal: No gross boney pedal deformities bilateral. No pain, crepitus, or limitation noted with foot and ankle range of motion bilateral. Muscular strength 5/5 in all groups tested bilateral.     Assessment:   Symptomatic onychomycosis of right hallux ingrown toenail currently on Eliquis     Plan:  -Treatment options discussed including all alternatives, risks, and complications -Etiology of symptoms were discussed -Nails debrided 10 without complications or bleeding. -Discussed partial nail avulsion if symptoms continue. -Daily foot inspection -Follow-up in 3 months or sooner if any problems arise. In the meantime, encouraged to call the office with any questions, concerns, change in symptoms.   Celesta Gentile, DPM

## 2020-05-01 DIAGNOSIS — I480 Paroxysmal atrial fibrillation: Secondary | ICD-10-CM | POA: Diagnosis not present

## 2020-05-01 DIAGNOSIS — H35033 Hypertensive retinopathy, bilateral: Secondary | ICD-10-CM | POA: Diagnosis not present

## 2020-05-01 DIAGNOSIS — M25562 Pain in left knee: Secondary | ICD-10-CM | POA: Diagnosis not present

## 2020-05-01 DIAGNOSIS — E039 Hypothyroidism, unspecified: Secondary | ICD-10-CM | POA: Diagnosis not present

## 2020-05-01 DIAGNOSIS — D509 Iron deficiency anemia, unspecified: Secondary | ICD-10-CM | POA: Diagnosis not present

## 2020-05-02 ENCOUNTER — Ambulatory Visit: Payer: Medicare Other | Admitting: Podiatry

## 2020-05-03 ENCOUNTER — Ambulatory Visit (INDEPENDENT_AMBULATORY_CARE_PROVIDER_SITE_OTHER): Payer: Medicare Other | Admitting: Podiatry

## 2020-05-03 ENCOUNTER — Other Ambulatory Visit: Payer: Self-pay

## 2020-05-03 DIAGNOSIS — L600 Ingrowing nail: Secondary | ICD-10-CM | POA: Diagnosis not present

## 2020-05-03 DIAGNOSIS — R52 Pain, unspecified: Secondary | ICD-10-CM

## 2020-05-03 DIAGNOSIS — Z1231 Encounter for screening mammogram for malignant neoplasm of breast: Secondary | ICD-10-CM | POA: Diagnosis not present

## 2020-05-03 NOTE — Progress Notes (Signed)
  Subjective:  Patient ID: Chelsea Hancock, female    DOB: 06/12/1934,  MRN: [id number]  Chief Complaint  Patient presents with  . Nail Problem    prev nail removal  great toe /pain and swelling in the foot     86 y.o. female presents with the above complaint. History confirmed with patient.  Had a recent nail debridement a few weeks ago with Dr. Earleen Newport.  She feels like the right medial border of the hallux nail is starting to become ingrown.  Objective:  Physical Exam: warm, good capillary refill, no trophic changes or ulcerative lesions, normal DP and PT pulses and normal sensory exam.  Right hallux nail with onychomycosis, pain on palpation to the medial border.  No deep ingrown noted or paronychia.  Assessment:   1. Ingrowing nail, right great toe   2. Pain      Plan:  Patient was evaluated and treated and all questions answered.   -Appears to have a painful nail border with a mild early ingrown nail.  This did not seem severe enough that she required a partial nail avulsion, and she would prefer not to have anesthesia today she says because she has to drive and has an appointment after this. -The offending nail border was debrided in a slant back fashion with a sharp nail nipper and a curette to clear out the subungual debris.  Tolerated well and had pain relief immediately after the procedure.  Return if symptoms worsen or fail to improve.

## 2020-05-11 DIAGNOSIS — M25562 Pain in left knee: Secondary | ICD-10-CM | POA: Diagnosis not present

## 2020-05-14 DIAGNOSIS — N6321 Unspecified lump in the left breast, upper outer quadrant: Secondary | ICD-10-CM | POA: Diagnosis not present

## 2020-05-15 DIAGNOSIS — M25562 Pain in left knee: Secondary | ICD-10-CM | POA: Diagnosis not present

## 2020-05-17 DIAGNOSIS — M25562 Pain in left knee: Secondary | ICD-10-CM | POA: Diagnosis not present

## 2020-06-08 DIAGNOSIS — M1712 Unilateral primary osteoarthritis, left knee: Secondary | ICD-10-CM | POA: Diagnosis not present

## 2020-06-21 DIAGNOSIS — E039 Hypothyroidism, unspecified: Secondary | ICD-10-CM | POA: Diagnosis not present

## 2020-06-21 DIAGNOSIS — H35033 Hypertensive retinopathy, bilateral: Secondary | ICD-10-CM | POA: Diagnosis not present

## 2020-06-21 DIAGNOSIS — I480 Paroxysmal atrial fibrillation: Secondary | ICD-10-CM | POA: Diagnosis not present

## 2020-06-21 DIAGNOSIS — E7800 Pure hypercholesterolemia, unspecified: Secondary | ICD-10-CM | POA: Diagnosis not present

## 2020-06-21 DIAGNOSIS — D509 Iron deficiency anemia, unspecified: Secondary | ICD-10-CM | POA: Diagnosis not present

## 2020-06-21 DIAGNOSIS — I4891 Unspecified atrial fibrillation: Secondary | ICD-10-CM | POA: Diagnosis not present

## 2020-06-28 ENCOUNTER — Telehealth: Payer: Self-pay | Admitting: Cardiology

## 2020-06-28 DIAGNOSIS — M25551 Pain in right hip: Secondary | ICD-10-CM | POA: Diagnosis not present

## 2020-06-28 DIAGNOSIS — I4891 Unspecified atrial fibrillation: Secondary | ICD-10-CM | POA: Diagnosis not present

## 2020-06-28 MED ORDER — AMIODARONE HCL 200 MG PO TABS: 200.0000 mg | ORAL_TABLET | Freq: Every day | ORAL | 0 refills | Status: DC

## 2020-06-28 NOTE — Telephone Encounter (Signed)
DOD Call:  Patient is at Dr. Trevor Mace office for hip pain but EKG showed Afib 123 bpm. BP 118/81. Patient was recently taken off of bisoprolol 5 mg and amiodarone was decreased to 100 mg QD. Patient is on eliquis and is asymptomatic.  Dr. Irish Lack spoke to Dr. Sherrlyn Hock and recommended patient increase amiodarone to 200 mg QD. He told her we would arrange follow upw ith Dr. Curt Bears or EP APP in 1 week.   Attempted to contact patient to schedule appointment but there was no answer. Left message for patient to call back. Will forward to Dr. Curt Bears RN to make aware.

## 2020-06-28 NOTE — Telephone Encounter (Signed)
STAT if HR is under 50 or over 120 (normal HR is 60-100 beats per minute)  1) What is your heart rate? 121  2) Do you have a log of your heart rate readings (document readings)?   3) Do you have any other symptoms? Pt is in the office with the PCP The PCP wants to discuss how to manage the patient's HR as the medications have recently changed

## 2020-06-29 ENCOUNTER — Telehealth: Payer: Self-pay | Admitting: Cardiology

## 2020-06-29 DIAGNOSIS — M25551 Pain in right hip: Secondary | ICD-10-CM | POA: Diagnosis not present

## 2020-06-29 NOTE — Telephone Encounter (Signed)
Pt scheduled for next available, 10/18

## 2020-06-29 NOTE — Telephone Encounter (Signed)
Daughter of the patient would like to discuss some potential changes to the patient's medication that were made at the Urgent Care facility yesterday. The patient has been scheduled for an appointment 07/09/20 at 10:45 but the daughter would like to discuss medications sooner than that.

## 2020-06-29 NOTE — Telephone Encounter (Signed)
Returned call to pt's dtr, dpr on file. Advised to have pt continue current medication advisement -- Amiodarone 200 mg once daily and stop Bisoprolol (dtr wondering if mom should restart Bisoprolol, as she could take extra PRN).  Aware I will further have Dr. Curt Bears review next week when he is back in office and I would call her if he had different recommendation.  Otherwise pt will continue Amiodarone at 200 mg daily until f/u appt on 10/18. dtr is agreeable to plan.

## 2020-07-04 ENCOUNTER — Encounter (HOSPITAL_COMMUNITY): Payer: Self-pay | Admitting: Emergency Medicine

## 2020-07-04 ENCOUNTER — Telehealth: Payer: Self-pay | Admitting: Cardiology

## 2020-07-04 ENCOUNTER — Emergency Department (HOSPITAL_COMMUNITY): Payer: Medicare Other

## 2020-07-04 ENCOUNTER — Emergency Department (HOSPITAL_COMMUNITY)
Admission: EM | Admit: 2020-07-04 | Discharge: 2020-07-04 | Disposition: A | Discharge status: Home / Self Care | Payer: Medicare Other | Attending: Emergency Medicine | Admitting: Emergency Medicine

## 2020-07-04 ENCOUNTER — Other Ambulatory Visit: Payer: Self-pay

## 2020-07-04 DIAGNOSIS — I480 Paroxysmal atrial fibrillation: Secondary | ICD-10-CM | POA: Diagnosis not present

## 2020-07-04 DIAGNOSIS — Z7901 Long term (current) use of anticoagulants: Secondary | ICD-10-CM | POA: Insufficient documentation

## 2020-07-04 DIAGNOSIS — M79652 Pain in left thigh: Secondary | ICD-10-CM | POA: Insufficient documentation

## 2020-07-04 DIAGNOSIS — M5416 Radiculopathy, lumbar region: Secondary | ICD-10-CM | POA: Insufficient documentation

## 2020-07-04 DIAGNOSIS — M545 Low back pain, unspecified: Secondary | ICD-10-CM | POA: Diagnosis not present

## 2020-07-04 DIAGNOSIS — K5730 Diverticulosis of large intestine without perforation or abscess without bleeding: Secondary | ICD-10-CM | POA: Diagnosis not present

## 2020-07-04 DIAGNOSIS — R1032 Left lower quadrant pain: Secondary | ICD-10-CM | POA: Insufficient documentation

## 2020-07-04 DIAGNOSIS — M1611 Unilateral primary osteoarthritis, right hip: Secondary | ICD-10-CM | POA: Diagnosis not present

## 2020-07-04 DIAGNOSIS — Z87891 Personal history of nicotine dependence: Secondary | ICD-10-CM | POA: Diagnosis not present

## 2020-07-04 DIAGNOSIS — E039 Hypothyroidism, unspecified: Secondary | ICD-10-CM | POA: Diagnosis not present

## 2020-07-04 DIAGNOSIS — I110 Hypertensive heart disease with heart failure: Secondary | ICD-10-CM | POA: Diagnosis not present

## 2020-07-04 DIAGNOSIS — Z96651 Presence of right artificial knee joint: Secondary | ICD-10-CM | POA: Diagnosis not present

## 2020-07-04 DIAGNOSIS — M5459 Other low back pain: Secondary | ICD-10-CM | POA: Diagnosis not present

## 2020-07-04 DIAGNOSIS — D509 Iron deficiency anemia, unspecified: Secondary | ICD-10-CM | POA: Diagnosis not present

## 2020-07-04 DIAGNOSIS — Z96642 Presence of left artificial hip joint: Secondary | ICD-10-CM | POA: Diagnosis not present

## 2020-07-04 DIAGNOSIS — E7800 Pure hypercholesterolemia, unspecified: Secondary | ICD-10-CM | POA: Diagnosis not present

## 2020-07-04 DIAGNOSIS — I5022 Chronic systolic (congestive) heart failure: Secondary | ICD-10-CM | POA: Insufficient documentation

## 2020-07-04 DIAGNOSIS — Z86010 Personal history of colonic polyps: Secondary | ICD-10-CM | POA: Diagnosis not present

## 2020-07-04 DIAGNOSIS — K8020 Calculus of gallbladder without cholecystitis without obstruction: Secondary | ICD-10-CM | POA: Diagnosis not present

## 2020-07-04 DIAGNOSIS — I4891 Unspecified atrial fibrillation: Secondary | ICD-10-CM | POA: Diagnosis not present

## 2020-07-04 DIAGNOSIS — Z79899 Other long term (current) drug therapy: Secondary | ICD-10-CM | POA: Diagnosis not present

## 2020-07-04 DIAGNOSIS — M549 Dorsalgia, unspecified: Secondary | ICD-10-CM

## 2020-07-04 DIAGNOSIS — H35033 Hypertensive retinopathy, bilateral: Secondary | ICD-10-CM | POA: Diagnosis not present

## 2020-07-04 DIAGNOSIS — I700 Atherosclerosis of aorta: Secondary | ICD-10-CM | POA: Diagnosis not present

## 2020-07-04 LAB — CBC
HCT: 43.3 % (ref 36.0–46.0)
Hemoglobin: 13.5 g/dL (ref 12.0–15.0)
MCH: 29.7 pg (ref 26.0–34.0)
MCHC: 31.2 g/dL (ref 30.0–36.0)
MCV: 95.2 fL (ref 80.0–100.0)
Platelets: 251 10*3/uL (ref 150–400)
RBC: 4.55 MIL/uL (ref 3.87–5.11)
RDW: 13.4 % (ref 11.5–15.5)
WBC: 8 10*3/uL (ref 4.0–10.5)
nRBC: 0 % (ref 0.0–0.2)

## 2020-07-04 LAB — LIPASE, BLOOD: Lipase: 31 U/L (ref 11–51)

## 2020-07-04 LAB — COMPREHENSIVE METABOLIC PANEL
ALT: 27 U/L (ref 0–44)
AST: 27 U/L (ref 15–41)
Albumin: 3.7 g/dL (ref 3.5–5.0)
Alkaline Phosphatase: 78 U/L (ref 38–126)
Anion gap: 10 (ref 5–15)
BUN: 22 mg/dL (ref 8–23)
CO2: 26 mmol/L (ref 22–32)
Calcium: 9.2 mg/dL (ref 8.9–10.3)
Chloride: 103 mmol/L (ref 98–111)
Creatinine, Ser: 0.88 mg/dL (ref 0.44–1.00)
GFR, Estimated: >60 mL/min (ref >60)
Glucose, Bld: 107 mg/dL — ABNORMAL HIGH (ref 70–99)
Potassium: 3.9 mmol/L (ref 3.5–5.1)
Sodium: 139 mmol/L (ref 135–145)
Total Bilirubin: 0.3 mg/dL (ref 0.3–1.2)
Total Protein: 6.5 g/dL (ref 6.5–8.1)

## 2020-07-04 LAB — URINALYSIS, ROUTINE W REFLEX MICROSCOPIC
Bilirubin Urine: NEGATIVE
Glucose, UA: NEGATIVE mg/dL
Hgb urine dipstick: NEGATIVE
Ketones, ur: NEGATIVE mg/dL
Leukocytes,Ua: NEGATIVE
Nitrite: NEGATIVE
Protein, ur: 30 mg/dL — AB
Specific Gravity, Urine: 1.026 (ref 1.005–1.030)
pH: 6 (ref 5.0–8.0)

## 2020-07-04 MED ORDER — OXYCODONE-ACETAMINOPHEN 5-325 MG PO TABS: 1.0000 | ORAL_TABLET | Freq: Four times a day (QID) | ORAL | 0 refills | Status: DC | PRN

## 2020-07-04 MED ORDER — AMIODARONE HCL 200 MG PO TABS
200.0000 mg | ORAL_TABLET | Freq: Every day | ORAL | Status: DC
  Administered 2020-07-04: 200 mg via ORAL

## 2020-07-04 MED ORDER — BISOPROLOL FUMARATE 5 MG PO TABS
5.0000 mg | ORAL_TABLET | Freq: Once | ORAL | Status: AC
  Administered 2020-07-04: 5 mg via ORAL
  Filled 2020-07-04: qty 1

## 2020-07-04 MED ORDER — ONDANSETRON HCL 4 MG/2ML IJ SOLN
4.0000 mg | Freq: Once | INTRAMUSCULAR | Status: AC
  Administered 2020-07-04: 4 mg via INTRAVENOUS
  Filled 2020-07-04: qty 2

## 2020-07-04 MED ORDER — MORPHINE SULFATE (PF) 4 MG/ML IV SOLN
4.0000 mg | Freq: Once | INTRAVENOUS | Status: AC
  Administered 2020-07-04: 4 mg via INTRAVENOUS
  Filled 2020-07-04: qty 1

## 2020-07-04 MED ORDER — ACETAMINOPHEN 500 MG PO TABS
1000.0000 mg | ORAL_TABLET | Freq: Once | ORAL | Status: AC
  Administered 2020-07-04: 1000 mg via ORAL
  Filled 2020-07-04: qty 2

## 2020-07-04 MED ORDER — APIXABAN 5 MG PO TABS
5.0000 mg | ORAL_TABLET | Freq: Once | ORAL | Status: AC
  Administered 2020-07-04: 5 mg via ORAL
  Filled 2020-07-04: qty 1

## 2020-07-04 MED ORDER — OXYCODONE-ACETAMINOPHEN 5-325 MG PO TABS
1.0000 | ORAL_TABLET | Freq: Once | ORAL | Status: AC
  Administered 2020-07-04: 1 via ORAL
  Filled 2020-07-04: qty 1

## 2020-07-04 MED ORDER — ACETAMINOPHEN 325 MG PO TABS
325.0000 mg | ORAL_TABLET | Freq: Once | ORAL | Status: AC
  Administered 2020-07-04: 325 mg via ORAL
  Filled 2020-07-04: qty 1

## 2020-07-04 MED ORDER — AMIODARONE HCL 200 MG PO TABS
200.0000 mg | ORAL_TABLET | Freq: Every day | ORAL | Status: DC
  Administered 2020-07-04: 200 mg via ORAL
  Filled 2020-07-04 (×2): qty 1

## 2020-07-04 MED ORDER — DIAZEPAM 2 MG PO TABS
2.0000 mg | ORAL_TABLET | Freq: Once | ORAL | Status: AC
  Administered 2020-07-04: 2 mg via ORAL
  Filled 2020-07-04: qty 1

## 2020-07-04 MED ORDER — KETOROLAC TROMETHAMINE 30 MG/ML IJ SOLN
15.0000 mg | Freq: Once | INTRAMUSCULAR | Status: AC
  Administered 2020-07-04: 15 mg via INTRAVENOUS
  Filled 2020-07-04: qty 1

## 2020-07-04 MED ORDER — OXYCODONE HCL 5 MG PO TABS
5.0000 mg | ORAL_TABLET | Freq: Once | ORAL | Status: AC
  Administered 2020-07-04: 5 mg via ORAL
  Filled 2020-07-04: qty 1

## 2020-07-04 NOTE — Telephone Encounter (Signed)
Left message to call back  

## 2020-07-04 NOTE — ED Notes (Signed)
Discharge instructions discussed with pt./caregive. Pt/caregiver verbalized understanding with no questions at this time. Pt to go home with caregiver at bedside. Transported via wheelchair to care

## 2020-07-04 NOTE — ED Notes (Signed)
Patient transported to MRI 

## 2020-07-04 NOTE — Discharge Instructions (Addendum)
1.  You have multiple degenerative changes in your back.  This is causing you severe pain.  It is important that you see your neurosurgeon for recheck as soon as possible. 2.  You may take 1-2 Percocet tablets every 6 hours as needed for pain control.  If you take 1 Percocet tablet, it contains the equivalent of 325 mg of acetaminophen (1 regular strength Tylenol).  You may take an additional 500 mg tablet of Tylenol (acetaminophen) with a single Percocet tablet in a 6-hour period.  This is the equivalent of taking 2 extra strength Tylenol (acetaminophen).  Be careful not to overdose your acetaminophen intake. 3.  If you tend to have any constipation, take over-the-counter Colace tablet twice daily.  If you have not had a bowel movement within 2 days, start taking MiraLAX daily.

## 2020-07-04 NOTE — ED Provider Notes (Signed)
[place] EMERGENCY DEPARTMENT Provider Note   CSN: [id number] Arrival date & time: 07/04/20  1497     History Chief Complaint  Patient presents with  . Abdominal Pain  . Tachycardia    Chelsea Hancock is a 86 y.o. female.  86 yo F with a chief complaint of left-sided low back pain that radiates to the left upper leg.  Is been going on for some time but acutely worsened about 3 AM this morning.  Unable to find a comfortable position.  No nausea or vomiting no abdominal pain.  No urinary symptoms.  No recent trauma.  Has a remote history of back surgery.  Denies loss of bowel or bladder denies loss of peritoneal sensation.  Denies numbness or weakness to the leg.  The history is provided by the patient.  Abdominal Pain Associated symptoms: no chest pain, no chills, no dysuria, no fever, no nausea, no shortness of breath and no vomiting   Back Pain Location:  Lumbar spine Quality:  Aching, stabbing and shooting Radiates to:  L thigh Pain severity:  Severe Pain is:  Same all the time Onset quality:  Sudden Duration:  8 hours Timing:  Constant Progression:  Worsening Chronicity:  Recurrent Relieved by:  Nothing Worsened by:  Palpation, ambulation, bending, touching and twisting Ineffective treatments:  None tried Associated symptoms: no abdominal pain, no chest pain, no dysuria, no fever and no headaches        Past Medical History:  Diagnosis Date  . Atrial fibrillation and flutter ([place])    a. s/p PVI ablation in 07/2015 with continue PAF  b. s/p repeat ablation on 08/15/16. continued on Flecainide 100mg  BID  . Carotid bruit    Right  . Chronic back pain   . Depression with anxiety 03/29/2013  . Diverticulosis   . Fibromyalgia   . Foot drop, right   . GERD (gastroesophageal reflux disease)   . Hemorrhoids   . HTN (hypertension)   . Hyperlipidemia   . IBS (irritable bowel syndrome)   . Memory loss 02/11/2015  . Onychomycosis 07/03/2013    . Osteoporosis   . Sigmoid colon ulcer 03/23/2012    Patient Active Problem List   Diagnosis Date Noted  . Chest pain 04/16/2019  . Chronic systolic CHF (congestive heart failure) ([place]) 04/16/2019  . Renal insufficiency 04/16/2019  . AF (atrial fibrillation) ([place]) 08/16/2016  . Atrial fibrillation and flutter ([place])   . HTN (hypertension)   . Chronic anticoagulation - Eliquis, CHADS2VASC=2 08/11/2015  . Mild cognitive impairment with memory loss 03/21/2015  . Insomnia 02/11/2015  . Protein deficiency ([place]) 02/11/2015  . Memory loss 02/11/2015  . Hypotension 01/21/2015  . OA (osteoarthritis) of knee 11/20/2014  . Medicare annual wellness visit, subsequent 08/13/2014  . Dyslipidemia 05/07/2014  . Atypical atrial flutter ([place]) 05/04/2014  . Incontinence of feces with fecal urgency 03/31/2014  . Hot flashes 01/24/2014  . Onychomycosis 07/03/2013  . Personal history of colonic polyps 06/28/2013  . Hypocalcemia 03/29/2013  . Depression with anxiety 03/29/2013  . Migraine 04/27/2012  . Hyperglycemia 04/27/2012  . Thrush 03/23/2012  . Sigmoid colon ulcer 03/23/2012  . Multinodular thyroid 09/02/2011  . CAROTID BRUIT 11/08/2010  . DEGENERATIVE DISC DISEASE, LUMBOSACRAL SPINE - followed by Dr. Ellene Route, [facility]  02/19/2010  . VERTEBRAL FRACTURE 02/19/2010  . LUMBOSACRAL STRAIN, ACUTE 02/19/2010  . BURSITIS, ACROMIOCLAVICULAR, LEFT 09/20/2009  . Irritable bowel syndrome 03/06/2009  . CERVICAL RADICULOPATHY 03/06/2009  . DIARRHEA, RECURRENT 03/06/2009  .  GERD 08/04/2008  . Fibromyalgia 08/04/2008  . OSTEOPENIA 08/04/2008    Past Surgical History:  Procedure Laterality Date  . ABDOMINAL HYSTERECTOMY  1981  . BACK SURGERY  1997   reconstructive lower back surgery  . CARDIOVERSION N/A 07/01/2016   Procedure: CARDIOVERSION;  Surgeon: Thayer Headings, MD;  Location: [facility];  Service: Cardiovascular;  Laterality: N/A;  . CARDIOVERSION N/A 10/22/2017   Procedure:  CARDIOVERSION;  Surgeon: Pixie Casino, MD;  Location: [facility] ENDOSCOPY;  Service: Cardiovascular;  Laterality: N/A;  . CARPAL TUNNEL RELEASE Left 2012   ulnar nerve release at elbow  . CERVICAL FUSION  2003, 2005, 2013  . COLONOSCOPY    . ELECTROPHYSIOLOGIC STUDY N/A 08/10/2015   Procedure: Afib;  Surgeon: Will Meredith Leeds, MD;  Location: [place] CV LAB;  Service: Cardiovascular;  Laterality: N/A;  . ELECTROPHYSIOLOGIC STUDY N/A 08/15/2016   Procedure: Atrial Fibrillation Ablation;  Surgeon: Will Meredith Leeds, MD;  Location: [place] CV LAB;  Service: Cardiovascular;  Laterality: N/A;  . EYE SURGERY  2015   b/l cataracts removed  . left elbow surgery  06/03/11   cyst removed  . LUMBAR LAMINECTOMY  1960   x 2  . POSTERIOR CERVICAL FUSION/FORAMINOTOMY  08/03/2012   Procedure: POSTERIOR CERVICAL FUSION/FORAMINOTOMY LEVEL 5;  Surgeon: [name], MD;  Location: [place] [facility] NEURO ORS;  Service: Neurosurgery;  Laterality: N/A;  Cervical four to Thoracic two Posterior cervical decompression with Facet and Pedicle screw fixation  . TEE WITHOUT CARDIOVERSION N/A 10/22/2017   Procedure: TRANSESOPHAGEAL ECHOCARDIOGRAM (TEE);  Surgeon: Pixie Casino, MD;  Location: [facility] END[facility]COPY;  Service: Cardiovascular;  Laterality: N/A;  . TONSILLECTOMY  1943  . TOTAL HIP ARTHROPLASTY Left 2012  . TOTAL KNEE ARTHROPLASTY Right 11/20/2014   Procedure: RIGHT TOTAL KNEE ARTHROPLASTY;  Surgeon: [name], MD;  Location: [facility] ORS;  Service: Orthopedics;  Laterality: Right;  . veins stripped  1970  . WRIST SURGERY Right 1990's   cyst removed     OB History   No obstetric history on file.     Family History  Problem Relation Age of Onset  . Bipolar disorder Mother   . Stomach cancer Mother   . Mental illness Mother   . Anxiety disorder Mother   . Stroke Father   . Colon cancer Father   . HIV/AIDS Son   . Stomach cancer Maternal Grandfather   . Stomach cancer Paternal Grandmother   . Healthy  Daughter   . Heart disease Maternal Grandmother   . Throat cancer Maternal Uncle        larynx  . Anesthesia problems Neg Hx   . Diabetes Neg Hx     Social History   Tobacco Use  . Smoking status: Former Smoker    Packs/day: 0.25    Years: 4.00    Pack years: 1.00    Types: Cigarettes  . Smokeless tobacco: Never Used  . Tobacco comment: quit 20+yrs ago  Vaping Use  . Vaping Use: Never used  Substance Use Topics  . Alcohol use: Yes    Alcohol/week: 0.0 standard drinks    Comment: WINE WITH DINNER. 2 GLASSES RED WINE  . Drug use: No    Home Medications Prior to Admission medications   Medication Sig Start Date End Date Taking? Authorizing Provider  amiodarone (PACERONE) 200 MG tablet Take 1 tablet (200 mg total) by mouth daily. 06/28/20   Jettie Booze, MD  amoxicillin-clavulanate (AUGMENTIN) 875-125 MG tablet amoxicillin 875 mg-potassium clavulanate  125 mg tablet  TAKE 1 TABLET BY MOUTH TWICE DAILY WITH MEALS FOR 7 DAYS    [provider]  atorvastatin (LIPITOR) 10 MG tablet Take 10 mg by mouth daily.    [provider]  Calcium Carb-Cholecalciferol 600-200 MG-UNIT TABS Take 1 tablet by mouth daily.    [provider]  clobetasol (TEMOVATE) 0.05 % GEL APPLY TO THE AFFECTED AREA OF SKIN ON ARMS TWICE DAILY 07/08/18   [provider]  denosumab (PROLIA) 60 MG/ML SOSY injection Inject 60 mg into the skin every 6 (six) months.    [provider]  ELIQUIS 5 MG TABS tablet Take 1 tablet by mouth twice daily. 03/14/19   Camnitz, Will Hassell Done, MD  esomeprazole (NEXIUM) 40 MG capsule TAKE ONE CAPSULE BY MOUTH EVERY DAY AT 12 NOON 05/17/18   Camnitz, Will Hassell Done, MD  eszopiclone (LUNESTA) 2 MG TABS tablet Take 1 tablet (2 mg total) by mouth at bedtime as needed for sleep. Take immediately before bedtime Patient taking differently: Take 2 mg by mouth at bedtime. Take immediately before bedtime 02/26/15   Mosie Lukes, MD  ezetimibe (ZETIA)  10 MG tablet TAKE 10 MG BY MOUTH AT BEDTIME 04/16/19   Fuller Plan A, MD  ferrous sulfate 325 (65 FE) MG tablet Take 1 tablet by mouth daily.    [provider]  gabapentin (NEURONTIN) 600 MG tablet TAKE 1 TABLET(600 MG) BY MOUTH TWICE DAILY 08/13/15   Darlin Coco, MD  hydrOXYzine (ATARAX/VISTARIL) 10 MG tablet Take 10-20 mg by mouth at bedtime as needed for itching (sleep).    [provider]  levothyroxine (SYNTHROID) 50 MCG tablet Take 50 mcg by mouth daily before breakfast.    [provider]  Melatonin 10 MG CAPS Take 10 mg by mouth at bedtime.    [provider]  Melatonin 10 MG TABS TAKE ONE TABLET BY MOUTH EVERYDAY AT BEDTIME WITH FOOD 04/02/20   [provider]  Multiple Vitamins-Minerals (CENTRUM SILVER ULTRA WOMENS) TABS Take 1 tablet by mouth daily.    [provider]  nortriptyline (PAMELOR) 50 MG capsule Take 100 mg by mouth at bedtime.    [provider]  Polyethyl Glycol-Propyl Glycol (SYSTANE OP) Place 1 drop into both eyes daily as needed (for dry eyes).    [provider]    Allergies    Cefuroxime axetil, Macrodantin, Alendronate sodium, Codeine, Demerol [meperidine], Meperidine hcl, Other, Penicillins, and Sulfonamide derivatives  Review of Systems   Review of Systems  Constitutional: Negative for chills and fever.  HENT: Negative for congestion and rhinorrhea.   Eyes: Negative for redness and visual disturbance.  Respiratory: Negative for shortness of breath and wheezing.   Cardiovascular: Negative for chest pain and palpitations.  Gastrointestinal: Negative for abdominal pain, nausea and vomiting.  Genitourinary: Negative for dysuria and urgency.  Musculoskeletal: Positive for arthralgias and back pain. Negative for myalgias.  Skin: Negative for pallor and wound.  Neurological: Negative for dizziness and headaches.    Physical Exam Updated Vital Signs BP (!) 134/94   Pulse (!) 113    Temp 98.4 F (36.9 C) (Oral)   Resp 12   Ht 5\' 3"  (1.6 m)   Wt 71.7 kg   SpO2 92%   BMI 27.99 kg/m   Physical Exam Vitals and nursing note reviewed.  Constitutional:      General: She is not in acute distress.    Appearance: She is well-developed. She is not diaphoretic.  HENT:  Head: Normocephalic and atraumatic.  Eyes:     Pupils: Pupils are equal, round, and reactive to light.  Cardiovascular:     Rate and Rhythm: Normal rate and regular rhythm.     Heart sounds: No murmur heard.  No friction rub. No gallop.   Pulmonary:     Effort: Pulmonary effort is normal.     Breath sounds: No wheezing or rales.  Abdominal:     General: There is no distension.     Palpations: Abdomen is soft.     Tenderness: There is no abdominal tenderness.     Comments: Very mild tenderness palpation to the left lower quadrant.  Pain much more intense with palpation of the left pubic ramus.  Musculoskeletal:        General: No tenderness.     Cervical back: Normal range of motion and neck supple.     Comments: Tenderness with palpation of the left SI joint area and along the piriformis muscle belly.  Pulse motor and sensation are intact to the left lower extremity.  Negative straight leg raise test.  Skin:    General: Skin is warm and dry.  Neurological:     Mental Status: She is alert and oriented to person, place, and time.  Psychiatric:        Behavior: Behavior normal.     ED Results / Procedures / Treatments   Labs (all labs ordered are listed, but only abnormal results are displayed) Labs Reviewed  COMPREHENSIVE METABOLIC PANEL - Abnormal; Notable for the following components:      Result Value   Glucose, Bld 107 (*)    All other components within normal limits  LIPASE, BLOOD  CBC  URINALYSIS, ROUTINE W REFLEX MICROSCOPIC    EKG EKG Interpretation  Date/Time:  Wednesday July 04 2020 07:20:43 EDT Ventricular Rate:  144 PR Interval:    QRS Duration: 100 QT  Interval:  322 QTC Calculation: 498 R Axis:   -23 Text Interpretation: Atrial fibrillation  with rvr Minimal voltage criteria for LVH, may be normal variant ( Cornell product ) Possible Anterior infarct , age undetermined Abnormal ECG Otherwise no significant change Confirmed by Deno Etienne [phone]) on 07/04/2020 9:26:57 AM   Radiology CT L-SPINE NO CHARGE  Result Date: 07/04/2020 CLINICAL DATA:  Left flank pain.  Back pain. EXAM: CT LUMBAR SPINE WITHOUT CONTRAST TECHNIQUE: Multidetector CT imaging of the lumbar spine was performed without intravenous contrast administration. Multiplanar CT image reconstructions were also generated. COMPARISON:  CT lumbar myelogram 02/08/2004. Lumbar spine radiographs 11/09/2015 FINDINGS: Segmentation: Normal Alignment: Mild anterolisthesis T12-L1. Mild retrolisthesis L1-2. Mild anterolisthesis L4-5 and L5-S1. Mild dextroscoliosis at L2-3. Vertebrae: Negative for fracture or mass. Paraspinal and other soft tissues: See CT abdomen pelvis report from today. Disc levels: T11-12: Mild disc and mild facet degeneration. Negative for stenosis T12-L1: Mild anterolisthesis. Mild disc and mild facet degeneration. Negative for stenosis L1-2: Moderate disc degeneration with disc space narrowing and Schmorl's node. Mild foraminal stenosis bilaterally. L2-3: Asymmetric disc degeneration on the left with disc space narrowing and extensive bony sclerosis. Diffuse endplate spurring and bilateral facet degeneration contribute to mild spinal stenosis and moderate subarticular stenosis bilaterally. No change since 2005. L3-4: Disc degeneration with diffuse disc bulging and moderate facet degeneration. Mild spinal stenosis and moderate subarticular stenosis bilaterally. Progressive stenosis compared with 2005. L4-5: Pedicle screw fusion bilaterally in satisfactory position. Solid interbody fusion. Posterior decompression. Negative for stenosis L5-S1: Advanced disc degeneration with disc space  narrowing and diffuse  endplate spurring. Moderate facet hypertrophy bilaterally. Mild foraminal narrowing bilaterally. IMPRESSION: Pedicle screw and solid fusion L4-5 without stenosis Multilevel degenerative change throughout the remainder lumbar spine as described above. Negative for fracture. Electronically Signed   By: Franchot Gallo M.D.   On: 07/04/2020 12:47   CT Renal Stone Study  Result Date: 07/04/2020 CLINICAL DATA:  Left flank pain.  Back pain EXAM: CT ABDOMEN AND PELVIS WITHOUT CONTRAST TECHNIQUE: Multidetector CT imaging of the abdomen and pelvis was performed following the standard protocol without IV contrast. COMPARISON:  CT abdomen pelvis 07/19/2015 FINDINGS: Lower chest: Mild scarring in the left lung base. No infiltrate or effusion. Heart size within normal limits. Hepatobiliary: Gallbladder is filled with gallstones. No gallbladder wall thickening or biliary dilatation. No focal liver lesion. Pancreas: Negative Spleen: Negative Adrenals/Urinary Tract: Adrenal glands are unremarkable. Kidneys are normal, without renal calculi, focal lesion, or hydronephrosis. Bladder is unremarkable. Stomach/Bowel: Stomach is within normal limits. Appendix nonvisualized. No evidence of bowel wall thickening, distention, or inflammatory changes. Mild sigmoid diverticulosis without diverticulitis. Vascular/Lymphatic: Atherosclerotic calcification abdominal aorta without aneurysm. No enlarged lymph nodes. Reproductive: Hysterectomy.  No pelvic mass. Other: Negative for free fluid.  Negative for hernia. Musculoskeletal: Lumbar scoliosis and multilevel degenerative change. Left hip replacement. Moderate degenerative change right hip. IMPRESSION: 1. Negative for urinary tract calculi 2. Cholelithiasis without cholecystitis or biliary dilatation 3. Sigmoid diverticulosis without diverticulitis 4. Atherosclerotic aorta without aneurysm. Electronically Signed   By: Franchot Gallo M.D.   On: 07/04/2020 12:53     Procedures Procedures (including critical care time)  Medications Ordered in ED Medications  amiodarone (PACERONE) tablet 200 mg (200 mg Oral Given 07/04/20 1111)  amiodarone (PACERONE) tablet 200 mg (has no administration in time range)  acetaminophen (TYLENOL) tablet 1,000 mg (1,000 mg Oral Given 07/04/20 1105)  ketorolac (TORADOL) 30 MG/ML injection 15 mg (15 mg Intravenous Given 07/04/20 1105)  oxyCODONE (Oxy IR/ROXICODONE) immediate release tablet 5 mg (5 mg Oral Given 07/04/20 1105)  diazepam (VALIUM) tablet 2 mg (2 mg Oral Given 07/04/20 1105)  morphine 4 MG/ML injection 4 mg (4 mg Intravenous Given 07/04/20 1353)  ondansetron (ZOFRAN) injection 4 mg (4 mg Intravenous Given 07/04/20 1352)    ED Course  I have reviewed the triage vital signs and the nursing notes.  Pertinent labs & imaging results that were available during my care of the patient were reviewed by me and considered in my medical decision making (see chart for details).    MDM Rules/Calculators/A&P                          86 yo F with a chief complaints of left low back pain that radiates to the upper portion of the leg.  Patient has had pain like this in the past but was much worse than normal starting at about 3 AM this morning.  Worse with movement palpation and twisting.  Most likely this is worsening of the patient's chronic back pain as the patient has no recent imaging or documentation of the same will obtain a CT scan of the abdomen pelvis with reformats of the L-spine.  Treat her pain here.  Patient is also in atrial fibrillation with RVR.  She is not sure when she is supposed to take her amiodarone but did not take any of her medications this morning.  We will give her a dose now.  CT scan without acute intra-abdominal pathology, no significant fracture of the L-spine.  Hardware appears to be in place.  Low back and reassess the patient.  Continues to have significant pain.  Family is here and able to  provide further history.  Patient had reportedly had similar back pain but was on the right side was seen as an outpatient by orthopedics and was started on prednisone.  Initially denies any loss of bowel or bladder or loss of perirectal sensation but now states that she is not urinated at all today.  We will perform a bladder scan.  CT scan viewed by me with bladder consistent with likely need to use the bathroom.  With low back pain that is changed sides and now difficulty urinating will obtain an MRI.  Awaiting MRI.  Signout to Dr. Vallery Ridge.  The patients results and plan were reviewed and discussed.   Any x-rays performed were independently reviewed by myself.   Differential diagnosis were considered with the presenting HPI.  Medications  amiodarone (PACERONE) tablet 200 mg (200 mg Oral Given 07/04/20 1111)  amiodarone (PACERONE) tablet 200 mg (has no administration in time range)  acetaminophen (TYLENOL) tablet 1,000 mg (1,000 mg Oral Given 07/04/20 1105)  ketorolac (TORADOL) 30 MG/ML injection 15 mg (15 mg Intravenous Given 07/04/20 1105)  oxyCODONE (Oxy IR/ROXICODONE) immediate release tablet 5 mg (5 mg Oral Given 07/04/20 1105)  diazepam (VALIUM) tablet 2 mg (2 mg Oral Given 07/04/20 1105)  morphine 4 MG/ML injection 4 mg (4 mg Intravenous Given 07/04/20 1353)  ondansetron (ZOFRAN) injection 4 mg (4 mg Intravenous Given 07/04/20 1352)    Vitals:   07/04/20 0920 07/04/20 1315 07/04/20 1512 07/04/20 1515  BP: (!) 146/98  (!) 134/94   Pulse: (!) 131 (!) 130 (!) 113   Resp: 18 20 12    Temp: 98.4 F (36.9 C)     TempSrc: Oral     SpO2: 97% 91% 92%   Weight:    71.7 kg  Height:    5\' 3"  (1.6 m)    Final diagnoses:  Back pain  Acute radicular low back pain      Final Clinical Impression(s) / ED Diagnoses Final diagnoses:  Back pain  Acute radicular low back pain    Rx / DC Orders ED Discharge Orders    None       Deno Etienne, DO 07/04/20 1630

## 2020-07-04 NOTE — ED Provider Notes (Signed)
F/U MRI, if no sig abnl, probable D/C. Physical Exam  BP (!) 134/94   Pulse (!) 113   Temp 98.4 F (36.9 C) (Oral)   Resp 12   Ht 5\' 3"  (1.6 m)   Wt 71.7 kg   SpO2 92%   BMI 27.99 kg/m   Physical Exam  ED Course/Procedures     Procedures  MDM  MRI without acute findings.  Multiple chronic findings.  Patient had improved pain control with IV and oral narcotic pain medication.  Urinalysis shows no signs of UTI.  Patient has chronic atrial fibrillation.  Taken off bisoprolol within the past weeks.  Patient was asymptomatic and rapid rate identified at medical checks.  Reviewed resuming additional low-dose bisoprolol with her already prescribed Pacerone and heart rate monitoring at home.  Follow-up within the next week as already scheduled with EP.  Patient was up to bathroom and required nursing assistance.  We discussed admission for pain related ambulatory dysfunction versus trial at home with pain control and follow-up with neurosurgery.  This time, patient would like to try home medications and outpatient follow-up.  Return precautions reviewed.      Charlesetta Shanks, MD 07/06/20 [phone]

## 2020-07-04 NOTE — Telephone Encounter (Signed)
Pt c/o medication issue:  1. Name of Medication: amiodarone (PACERONE) 200 MG tablet  2. How are you currently taking this medication (dosage and times per day)? As written  3. Are you having a reaction (difficulty breathing--STAT)? No  4. What is your medication issue? Needs new prescription with refills.   Please call [facility] at [phone]

## 2020-07-04 NOTE — ED Triage Notes (Signed)
Pt reports LLQ pain since 0300 this am, denies any associated symptoms, also endorses some tachycardia (denies hx of anxiety but states she has palpitations when she gets nervous). No cp or sob at this time.

## 2020-07-04 NOTE — ED Notes (Addendum)
Post void bladder scan 223 mL. Urinary output 20 ml. Dr. Melodye Ped made aware

## 2020-07-09 ENCOUNTER — Ambulatory Visit (INDEPENDENT_AMBULATORY_CARE_PROVIDER_SITE_OTHER): Payer: Medicare Other | Admitting: Cardiology

## 2020-07-09 ENCOUNTER — Other Ambulatory Visit: Payer: Self-pay

## 2020-07-09 ENCOUNTER — Encounter: Payer: Self-pay | Admitting: Cardiology

## 2020-07-09 VITALS — BP 124/72 | HR 88 | Ht 63.0 in | Wt 162.0 lb

## 2020-07-09 DIAGNOSIS — I4819 Other persistent atrial fibrillation: Secondary | ICD-10-CM

## 2020-07-09 MED ORDER — METOPROLOL SUCCINATE ER 50 MG PO TB24: 50.0000 mg | ORAL_TABLET | Freq: Every day | ORAL | 3 refills | Status: DC

## 2020-07-09 NOTE — Progress Notes (Signed)
Electrophysiology Office Note   Date:  07/09/2020   ID:  Chelsea Hancock, DOB April 26, 1934, MRN [id number]  PCP:  Lajean Manes, MD  Cardiologist:  [name] Primary Electrophysiologist:  Constance Haw, MD    No chief complaint on file.    History of Present Illness: Chelsea Hancock is a 86 y.o. female who presents today for electrophysiology evaluation.     She has a history of atrial fibrillation/flutter.  She is status post ablation November 2016 with repeat ablation 08/15/2016.  Her amiodarone dose was decreased to 100 mg and her bisoprolol was stopped at her last visit.  Unfortunately she has gone back into atrial fibrillation.  Today, denies symptoms of palpitations, chest pain, shortness of breath, orthopnea, PND, lower extremity edema, claudication, dizziness, presyncope, syncope, bleeding, or neurologic sequela. The patient is tolerating medications without difficulties.  Her heart rate is better controlled.  She has no chest pain or shortness of breath.  She does have some fatigue.  Her main symptoms though is pain in her hips.  She went to the emergency room without much control.  The pain comes and goes.  She does have follow-up with Dr. Roanna Banning tomorrow.  Past Medical History:  Diagnosis Date  . Atrial fibrillation and flutter ([place])    a. s/p PVI ablation in 07/2015 with continue PAF  b. s/p repeat ablation on 08/15/16. continued on Flecainide 100mg  BID  . Carotid bruit    Right  . Chronic back pain   . Depression with anxiety 03/29/2013  . Diverticulosis   . Fibromyalgia   . Foot drop, right   . GERD (gastroesophageal reflux disease)   . Hemorrhoids   . HTN (hypertension)   . Hyperlipidemia   . IBS (irritable bowel syndrome)   . Memory loss 02/11/2015  . Onychomycosis 07/03/2013  . Osteoporosis   . Sigmoid colon ulcer 03/23/2012   Past Surgical History:  Procedure Laterality Date  . ABDOMINAL HYSTERECTOMY  1981  . BACK SURGERY  1997    reconstructive lower back surgery  . CARDIOVERSION N/A 07/01/2016   Procedure: CARDIOVERSION;  Surgeon: Thayer Headings, MD;  Location: [place];  Service: Cardiovascular;  Laterality: N/A;  . CARDIOVERSION N/A 10/22/2017   Procedure: CARDIOVERSION;  Surgeon: Pixie Casino, MD;  Location: [facility] ENDOSCOPY;  Service: Cardiovascular;  Laterality: N/A;  . CARPAL TUNNEL RELEASE Left 2012   ulnar nerve release at elbow  . CERVICAL FUSION  2003, 2005, 2013  . COLONOSCOPY    . ELECTROPHYSIOLOGIC STUDY N/A 08/10/2015   Procedure: Afib;  Surgeon: Mahlet Jergens Meredith Leeds, MD;  Location: [place] CV LAB;  Service: Cardiovascular;  Laterality: N/A;  . ELECTROPHYSIOLOGIC STUDY N/A 08/15/2016   Procedure: Atrial Fibrillation Ablation;  Surgeon: Yaniv Lage Meredith Leeds, MD;  Location: [place] CV LAB;  Service: Cardiovascular;  Laterality: N/A;  . EYE SURGERY  2015   b/l cataracts removed  . left elbow surgery  06/03/11   cyst removed  . LUMBAR LAMINECTOMY  1960   x 2  . POSTERIOR CERVICAL FUSION/FORAMINOTOMY  08/03/2012   Procedure: POSTERIOR CERVICAL FUSION/FORAMINOTOMY LEVEL 5;  Surgeon: [name], MD;  Location: [place] NEURO ORS;  Service: Neurosurgery;  Laterality: N/A;  Cervical four to Thoracic two Posterior cervical decompression with Facet and Pedicle screw fixation  . TEE WITHOUT CARDIOVERSION N/A 10/22/2017   Procedure: TRANSESOPHAGEAL ECHOCARDIOGRAM (TEE);  Surgeon: Pixie Casino, MD;  Location: [facility] ENDOSCOPY;  Service: Cardiovascular;  Laterality: N/A;  . TONSILLECTOMY  1943  .  TOTAL HIP ARTHROPLASTY Left 2012  . TOTAL KNEE ARTHROPLASTY Right 11/20/2014   Procedure: RIGHT TOTAL KNEE ARTHROPLASTY;  Surgeon: [name], MD;  Location: [facility] ORS;  Service: Orthopedics;  Laterality: Right;  . veins stripped  1970  . WRIST SURGERY Right 1990's   cyst removed     Current Outpatient Medications  Medication Sig Dispense Refill  . amiodarone (PACERONE) 200 MG tablet Take 1 tablet (200 mg  total) by mouth daily. 30 tablet 0  . amoxicillin-clavulanate (AUGMENTIN) 875-125 MG tablet amoxicillin 875 mg-potassium clavulanate 125 mg tablet  TAKE 1 TABLET BY MOUTH TWICE DAILY WITH MEALS FOR 7 DAYS    . atorvastatin (LIPITOR) 10 MG tablet Take 10 mg by mouth daily.    . Calcium Carb-Cholecalciferol 600-200 MG-UNIT TABS Take 1 tablet by mouth daily.    . clobetasol (TEMOVATE) 0.05 % GEL APPLY TO THE AFFECTED AREA OF SKIN ON ARMS TWICE DAILY  0  . denosumab (PROLIA) 60 MG/ML SOSY injection Inject 60 mg into the skin every 6 (six) months.    Marland Kitchen ELIQUIS 5 MG TABS tablet Take 1 tablet by mouth twice daily. 60 tablet 3  . esomeprazole (NEXIUM) 40 MG capsule TAKE ONE CAPSULE BY MOUTH EVERY DAY AT 12 NOON 90 capsule 2  . eszopiclone (LUNESTA) 2 MG TABS tablet Take 1 tablet (2 mg total) by mouth at bedtime as needed for sleep. Take immediately before bedtime (Patient taking differently: Take 2 mg by mouth at bedtime. Take immediately before bedtime) 30 tablet 2  . ezetimibe (ZETIA) 10 MG tablet TAKE 10 MG BY MOUTH AT BEDTIME 30 tablet 6  . ferrous sulfate 325 (65 FE) MG tablet Take 1 tablet by mouth daily.    Marland Kitchen gabapentin (NEURONTIN) 600 MG tablet TAKE 1 TABLET(600 MG) BY MOUTH TWICE DAILY 180 tablet 0  . hydrOXYzine (ATARAX/VISTARIL) 10 MG tablet Take 10-20 mg by mouth at bedtime as needed for itching (sleep).    Marland Kitchen levothyroxine (SYNTHROID) 50 MCG tablet Take 50 mcg by mouth daily before breakfast.    . Melatonin 10 MG CAPS Take 10 mg by mouth at bedtime.    . Multiple Vitamins-Minerals (CENTRUM SILVER ULTRA WOMENS) TABS Take 1 tablet by mouth daily.    . nortriptyline (PAMELOR) 50 MG capsule Take 100 mg by mouth at bedtime.    Marland Kitchen oxyCODONE-acetaminophen (PERCOCET) 5-325 MG tablet Take 1-2 tablets by mouth every 6 (six) hours as needed. 20 tablet 0  . Polyethyl Glycol-Propyl Glycol (SYSTANE OP) Place 1 drop into both eyes daily as needed (for dry eyes).    . metoprolol succinate (TOPROL-XL) 50 MG 24  hr tablet Take 1 tablet (50 mg total) by mouth at bedtime. Take with or immediately following a meal. 30 tablet 3   No current facility-administered medications for this visit.    Allergies:   Cefuroxime axetil, Macrodantin, Alendronate sodium, Codeine, Demerol [meperidine], Meperidine hcl, Other, Penicillins, and Sulfonamide derivatives   Social History:  The patient  reports that she has quit smoking. Her smoking use included cigarettes. She has a 1.00 pack-year smoking history. She has never used smokeless tobacco. She reports current alcohol use. She reports that she does not use drugs.   Family History:  The patient's family history includes Anxiety disorder in her mother; Bipolar disorder in her mother; Colon cancer in her father; HIV/AIDS in her son; Healthy in her daughter; Heart disease in her maternal grandmother; Mental illness in her mother; Stomach cancer in her maternal grandfather, mother, and  paternal grandmother; Stroke in her father; Throat cancer in her maternal uncle.   ROS:  Please see the history of present illness.   Otherwise, review of systems is positive for none.   All other systems are reviewed and negative.   PHYSICAL EXAM: VS:  BP 124/72   Pulse 88   Ht 5\' 3"  (1.6 m)   Wt 162 lb (73.5 kg)   SpO2 97%   BMI 28.70 kg/m  , BMI Body mass index is 28.7 kg/m. GEN: Well nourished, well developed, in no acute distress  HEENT: normal  Neck: no JVD, carotid bruits, or masses Cardiac: irregular; no murmurs, rubs, or gallops,no edema  Respiratory:  clear to auscultation bilaterally, normal work of breathing GI: soft, nontender, nondistended, + BS MS: no deformity or atrophy  Skin: warm and dry Neuro:  Strength and sensation are intact Psych: euthymic mood, full affect  EKG:  EKG is not ordered today. Personal review of the ekg ordered 07/05/20 shows atrial fibrillation  Recent Labs: 07/04/2020: ALT 27; BUN 22; Creatinine, Ser 0.88; Hemoglobin 13.5; Platelets 251;  Potassium 3.9; Sodium 139    Lipid Panel     Component Value Date/Time   CHOL 161 04/16/2019 0352   TRIG 120 04/16/2019 0352   TRIG 133 06/30/2006 1110   HDL 53 04/16/2019 0352   CHOLHDL 3.0 04/16/2019 0352   VLDL 24 04/16/2019 0352   LDLCALC 84 04/16/2019 0352   LDLDIRECT 93.3 08/04/2008 0915     Wt Readings from Last 3 Encounters:  07/09/20 162 lb (73.5 kg)  07/04/20 158 lb (71.7 kg)  03/13/20 160 lb (72.6 kg)      Other studies Reviewed: Additional studies/ records that were reviewed today include: TEE 10/22/17  Review of the above records today demonstrates:  - Left ventricle: There was mild concentric hypertrophy. Systolic   function was mildly reduced. The estimated ejection fraction was   in the range of 45% to 50%. Diffuse hypokinesis. No evidence of   thrombus. - Aortic valve: No evidence of vegetation. - Mitral valve: There was mild regurgitation. - Left atrium: No evidence of thrombus in the atrial cavity or   appendage. No evidence of thrombus in the atrial cavity or   appendage. - Right atrium: No evidence of thrombus in the atrial cavity or   appendage. 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 This was visualized in 2D and 3D modes. - Tricuspid valve: There was mild regurgitation. - Pulmonic valve: There was trivial regurgitation.   ASSESSMENT AND PLAN:  1.  Atrial fibrillation: Status post 2 ablations, most recently 08/15/2016.  Currently on amiodarone (monitoring for high risk medication) and Eliquis.  CHA2DS2-VASc 3.  Unfortunately she is back in atrial fibrillation.  She has weakness and fatigue and would benefit from cardioversion.  I Dellar Traber also start her on Toprol-XL 50 mg.  2.  Hypertension: Currently well controlled  3.   Hyperlipidemia: Continue atorvastatin and Zetia  Current medicines are reviewed at length with the patient today.   The patient does not have concerns regarding her medicines.  The following changes were made today: Start Toprol-XL  Labs/ tests ordered today include:  No orders of the defined types were placed in this encounter.    Disposition:  FU with Grecia Lynk 6 months  Signed, Jelisha Weed Meredith Leeds, MD  07/09/2020 11:39 AM     [facility] [address] [place] [address] [phone] (office) [phone] (fax)

## 2020-07-09 NOTE — Patient Instructions (Addendum)
Medication Instructions:  Your physician has recommended you make the following change in your medication:  1. START Metoprolol Succinate (Toprol) 50 mg once daily at bedtime.  *If you need a refill on your cardiac medications before your next appointment, please call your pharmacy*   Lab Work: None ordered   Testing/Procedures: None ordered   Follow-Up: At [facility], you and your health needs are our priority.  As part of our continuing mission to provide you with exceptional heart care, we have created designated Provider Care Teams.  These Care Teams include your primary Cardiologist (physician) and Advanced Practice Providers (APPs -  Physician Assistants and Nurse Practitioners) who all work together to provide you with the care you need, when you need it.  We recommend signing up for the patient portal called "MyChart".  Sign up information is provided on this After Visit Summary.  MyChart is used to connect with patients for Virtual Visits (Telemedicine).  Patients are able to view lab/test results, encounter notes, upcoming appointments, etc.  Non-urgent messages can be sent to your provider as well.   To learn more about what you can do with MyChart, go to [email or web address].    Your next appointment:   1 month(s)  The format for your next appointment:   In Person  Provider:   You will follow up in the [facility] located at [facility]. Your provider will be: Roderic Palau, NP or Clint R. Fenton, PA-C    Thank you for choosing [facility]!!   Trinidad Curet, RN [phone]   Other Instructions   You are scheduled for a  Cardioversion on 07/19/20 with Dr. Aundra Dubin.  Please arrive at the [facility] (Main Entrance A) at [facility]: [address] [place], [place] [address] at 9:00 am.  DIET: Nothing to eat or drink after midnight except a sip of water with medications (see medication instructions below)  Medication  Instructions: You may take your morning medications with sip of water  Continue your anticoagulant: Eliquis You will need to continue your anticoagulant after your procedure until you are told by your provider that it is safe to stop   Labs: None ordered  You must have a responsible person to drive you home and stay in the waiting area during your procedure. Failure to do so could result in cancellation.  Bring your insurance cards.  *Special Note: Every effort is made to have your procedure done on time. Occasionally there are emergencies that occur at the hospital that may cause delays. Please be patient if a delay does occur.

## 2020-07-09 NOTE — H&P (View-Only) (Signed)
Electrophysiology Office Note   Date:  07/09/2020   ID:  Chelsea Hancock, DOB 20-Sep-1934, MRN [id number]  PCP:  Lajean Manes, MD  Cardiologist:  [name] Primary Electrophysiologist:  Constance Haw, MD    No chief complaint on file.    History of Present Illness: Chelsea Hancock is a 86 y.o. female who presents today for electrophysiology evaluation.     She has a history of atrial fibrillation/flutter.  She is status post ablation November 2016 with repeat ablation 08/15/2016.  Her amiodarone dose was decreased to 100 mg and her bisoprolol was stopped at her last visit.  Unfortunately she has gone back into atrial fibrillation.  Today, denies symptoms of palpitations, chest pain, shortness of breath, orthopnea, PND, lower extremity edema, claudication, dizziness, presyncope, syncope, bleeding, or neurologic sequela. The patient is tolerating medications without difficulties.  Her heart rate is better controlled.  She has no chest pain or shortness of breath.  She does have some fatigue.  Her main symptoms though is pain in her hips.  She went to the emergency room without much control.  The pain comes and goes.  She does have follow-up with Dr. Roanna Banning tomorrow.  Past Medical History:  Diagnosis Date  . Atrial fibrillation and flutter ([place])    a. s/p PVI ablation in 07/2015 with continue PAF  b. s/p repeat ablation on 08/15/16. continued on Flecainide 100mg  BID  . Carotid bruit    Right  . Chronic back pain   . Depression with anxiety 03/29/2013  . Diverticulosis   . Fibromyalgia   . Foot drop, right   . GERD (gastroesophageal reflux disease)   . Hemorrhoids   . HTN (hypertension)   . Hyperlipidemia   . IBS (irritable bowel syndrome)   . Memory loss 02/11/2015  . Onychomycosis 07/03/2013  . Osteoporosis   . Sigmoid colon ulcer 03/23/2012   Past Surgical History:  Procedure Laterality Date  . ABDOMINAL HYSTERECTOMY  1981  . BACK SURGERY  1997    reconstructive lower back surgery  . CARDIOVERSION N/A 07/01/2016   Procedure: CARDIOVERSION;  Surgeon: Thayer Headings, MD;  Location: [place];  Service: Cardiovascular;  Laterality: N/A;  . CARDIOVERSION N/A 10/22/2017   Procedure: CARDIOVERSION;  Surgeon: Pixie Casino, MD;  Location: [facility] ENDOSCOPY;  Service: Cardiovascular;  Laterality: N/A;  . CARPAL TUNNEL RELEASE Left 2012   ulnar nerve release at elbow  . CERVICAL FUSION  2003, 2005, 2013  . COLONOSCOPY    . ELECTROPHYSIOLOGIC STUDY N/A 08/10/2015   Procedure: Afib;  Surgeon: Deborh Pense Meredith Leeds, MD;  Location: [place] CV LAB;  Service: Cardiovascular;  Laterality: N/A;  . ELECTROPHYSIOLOGIC STUDY N/A 08/15/2016   Procedure: Atrial Fibrillation Ablation;  Surgeon: Danel Requena Meredith Leeds, MD;  Location: [place] CV LAB;  Service: Cardiovascular;  Laterality: N/A;  . EYE SURGERY  2015   b/l cataracts removed  . left elbow surgery  06/03/11   cyst removed  . LUMBAR LAMINECTOMY  1960   x 2  . POSTERIOR CERVICAL FUSION/FORAMINOTOMY  08/03/2012   Procedure: POSTERIOR CERVICAL FUSION/FORAMINOTOMY LEVEL 5;  Surgeon: [name], MD;  Location: [place] NEURO ORS;  Service: Neurosurgery;  Laterality: N/A;  Cervical four to Thoracic two Posterior cervical decompression with Facet and Pedicle screw fixation  . TEE WITHOUT CARDIOVERSION N/A 10/22/2017   Procedure: TRANSESOPHAGEAL ECHOCARDIOGRAM (TEE);  Surgeon: Pixie Casino, MD;  Location: [facility] ENDOSCOPY;  Service: Cardiovascular;  Laterality: N/A;  . TONSILLECTOMY  1943  .  TOTAL HIP ARTHROPLASTY Left 2012  . TOTAL KNEE ARTHROPLASTY Right 11/20/2014   Procedure: RIGHT TOTAL KNEE ARTHROPLASTY;  Surgeon: [name], MD;  Location: [facility] ORS;  Service: Orthopedics;  Laterality: Right;  . veins stripped  1970  . WRIST SURGERY Right 1990's   cyst removed     Current Outpatient Medications  Medication Sig Dispense Refill  . amiodarone (PACERONE) 200 MG tablet Take 1 tablet (200 mg  total) by mouth daily. 30 tablet 0  . amoxicillin-clavulanate (AUGMENTIN) 875-125 MG tablet amoxicillin 875 mg-potassium clavulanate 125 mg tablet  TAKE 1 TABLET BY MOUTH TWICE DAILY WITH MEALS FOR 7 DAYS    . atorvastatin (LIPITOR) 10 MG tablet Take 10 mg by mouth daily.    . Calcium Carb-Cholecalciferol 600-200 MG-UNIT TABS Take 1 tablet by mouth daily.    . clobetasol (TEMOVATE) 0.05 % GEL APPLY TO THE AFFECTED AREA OF SKIN ON ARMS TWICE DAILY  0  . denosumab (PROLIA) 60 MG/ML SOSY injection Inject 60 mg into the skin every 6 (six) months.    Marland Kitchen ELIQUIS 5 MG TABS tablet Take 1 tablet by mouth twice daily. 60 tablet 3  . esomeprazole (NEXIUM) 40 MG capsule TAKE ONE CAPSULE BY MOUTH EVERY DAY AT 12 NOON 90 capsule 2  . eszopiclone (LUNESTA) 2 MG TABS tablet Take 1 tablet (2 mg total) by mouth at bedtime as needed for sleep. Take immediately before bedtime (Patient taking differently: Take 2 mg by mouth at bedtime. Take immediately before bedtime) 30 tablet 2  . ezetimibe (ZETIA) 10 MG tablet TAKE 10 MG BY MOUTH AT BEDTIME 30 tablet 6  . ferrous sulfate 325 (65 FE) MG tablet Take 1 tablet by mouth daily.    Marland Kitchen gabapentin (NEURONTIN) 600 MG tablet TAKE 1 TABLET(600 MG) BY MOUTH TWICE DAILY 180 tablet 0  . hydrOXYzine (ATARAX/VISTARIL) 10 MG tablet Take 10-20 mg by mouth at bedtime as needed for itching (sleep).    Marland Kitchen levothyroxine (SYNTHROID) 50 MCG tablet Take 50 mcg by mouth daily before breakfast.    . Melatonin 10 MG CAPS Take 10 mg by mouth at bedtime.    . Multiple Vitamins-Minerals (CENTRUM SILVER ULTRA WOMENS) TABS Take 1 tablet by mouth daily.    . nortriptyline (PAMELOR) 50 MG capsule Take 100 mg by mouth at bedtime.    Marland Kitchen oxyCODONE-acetaminophen (PERCOCET) 5-325 MG tablet Take 1-2 tablets by mouth every 6 (six) hours as needed. 20 tablet 0  . Polyethyl Glycol-Propyl Glycol (SYSTANE OP) Place 1 drop into both eyes daily as needed (for dry eyes).    . metoprolol succinate (TOPROL-XL) 50 MG 24  hr tablet Take 1 tablet (50 mg total) by mouth at bedtime. Take with or immediately following a meal. 30 tablet 3   No current facility-administered medications for this visit.    Allergies:   Cefuroxime axetil, Macrodantin, Alendronate sodium, Codeine, Demerol [meperidine], Meperidine hcl, Other, Penicillins, and Sulfonamide derivatives   Social History:  The patient  reports that she has quit smoking. Her smoking use included cigarettes. She has a 1.00 pack-year smoking history. She has never used smokeless tobacco. She reports current alcohol use. She reports that she does not use drugs.   Family History:  The patient's family history includes Anxiety disorder in her mother; Bipolar disorder in her mother; Colon cancer in her father; HIV/AIDS in her son; Healthy in her daughter; Heart disease in her maternal grandmother; Mental illness in her mother; Stomach cancer in her maternal grandfather, mother, and  paternal grandmother; Stroke in her father; Throat cancer in her maternal uncle.   ROS:  Please see the history of present illness.   Otherwise, review of systems is positive for none.   All other systems are reviewed and negative.   PHYSICAL EXAM: VS:  BP 124/72   Pulse 88   Ht 5\' 3"  (1.6 m)   Wt 162 lb (73.5 kg)   SpO2 97%   BMI 28.70 kg/m  , BMI Body mass index is 28.7 kg/m. GEN: Well nourished, well developed, in no acute distress  HEENT: normal  Neck: no JVD, carotid bruits, or masses Cardiac: irregular; no murmurs, rubs, or gallops,no edema  Respiratory:  clear to auscultation bilaterally, normal work of breathing GI: soft, nontender, nondistended, + BS MS: no deformity or atrophy  Skin: warm and dry Neuro:  Strength and sensation are intact Psych: euthymic mood, full affect  EKG:  EKG is not ordered today. Personal review of the ekg ordered 07/05/20 shows atrial fibrillation  Recent Labs: 07/04/2020: ALT 27; BUN 22; Creatinine, Ser 0.88; Hemoglobin 13.5; Platelets 251;  Potassium 3.9; Sodium 139    Lipid Panel     Component Value Date/Time   CHOL 161 04/16/2019 0352   TRIG 120 04/16/2019 0352   TRIG 133 06/30/2006 1110   HDL 53 04/16/2019 0352   CHOLHDL 3.0 04/16/2019 0352   VLDL 24 04/16/2019 0352   LDLCALC 84 04/16/2019 0352   LDLDIRECT 93.3 08/04/2008 0915     Wt Readings from Last 3 Encounters:  07/09/20 162 lb (73.5 kg)  07/04/20 158 lb (71.7 kg)  03/13/20 160 lb (72.6 kg)      Other studies Reviewed: Additional studies/ records that were reviewed today include: TEE 10/22/17  Review of the above records today demonstrates:  - Left ventricle: There was mild concentric hypertrophy. Systolic   function was mildly reduced. The estimated ejection fraction was   in the range of 45% to 50%. Diffuse hypokinesis. No evidence of   thrombus. - Aortic valve: No evidence of vegetation. - Mitral valve: There was mild regurgitation. - Left atrium: No evidence of thrombus in the atrial cavity or   appendage. No evidence of thrombus in the atrial cavity or   appendage. - Right atrium: No evidence of thrombus in the atrial cavity or   appendage. 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 This was visualized in 2D and 3D modes. - Tricuspid valve: There was mild regurgitation. - Pulmonic valve: There was trivial regurgitation.   ASSESSMENT AND PLAN:  1.  Atrial fibrillation: Status post 2 ablations, most recently 08/15/2016.  Currently on amiodarone (monitoring for high risk medication) and Eliquis.  CHA2DS2-VASc 3.  Unfortunately she is back in atrial fibrillation.  She has weakness and fatigue and would benefit from cardioversion.  I Marvin Grabill also start her on Toprol-XL 50 mg.  2.  Hypertension: Currently well controlled  3.   Hyperlipidemia: Continue atorvastatin and Zetia  Current medicines are reviewed at length with the patient today.   The patient does not have concerns regarding her medicines.  The following changes were made today: Start Toprol-XL  Labs/ tests ordered today include:  No orders of the defined types were placed in this encounter.    Disposition:  FU with Vir [place] 6 months  Signed, Shoua Ressler Meredith Leeds, MD  07/09/2020 11:39 AM     [facility] [address] [place] [address] [phone] (office) [phone] (fax)

## 2020-07-10 DIAGNOSIS — D509 Iron deficiency anemia, unspecified: Secondary | ICD-10-CM | POA: Diagnosis not present

## 2020-07-11 ENCOUNTER — Telehealth: Payer: Self-pay | Admitting: *Deleted

## 2020-07-11 DIAGNOSIS — M5416 Radiculopathy, lumbar region: Secondary | ICD-10-CM | POA: Diagnosis not present

## 2020-07-11 NOTE — Telephone Encounter (Addendum)
   Primary Cardiologist: Will Meredith Leeds, MD  Chart reviewed as part of pre-operative protocol coverage. Patient was contacted 07/11/2020 in reference to pre-operative risk assessment for pending surgery as outlined below.  Chelsea Hancock was last seen on 07/09/20 by Dr. Curt Bears.  Since that day, Chelsea Hancock has done well with the exception of activity-limiting back pain.  I was able to speak with her daughter, Ms. Whitelaw. She is unable to complete 4.0 METS due to back pain, but does not have a history of MI or stroke. They are aware that she is at higher risk for cardiac complications, but this is a low risk procedure and they wish to proceed.   Issues regarding holding anticoagulation for spinal injection are complicated given her upcoming DCCV scheduled for 07/19/20.  She is required to complete uninterrupted anticoagulation for three week prior to and four weeks after cardioversion. The first day she can hold anticoagulation would be Nov 26.   Per our clinical pharmacist: Patient with diagnosis of ATRIAL FIBRILLATION on ELIQUIS for anticoagulation.    Procedure: L2-3, L5-S1 TRANSLAMINAR EPIDURAL INJECTION Date of procedure: TBD    CHA2DS2-VASc Score = 5  This indicates a 7.2% annual risk of stroke. The patient's score is based upon: CHF History: 1 HTN History: 1 Diabetes History: 0 Stroke History: 0 Vascular Disease History: 0 Age Score: 2 Gender Score: 1   CrCl = 56ML/MIN Platelet count = 251  Per office protocol, patient can hold ELIQUIS for 3 days prior to procedure.   Therefore, based on ACC/AHA guidelines, the patient would be at acceptable risk for the planned procedure without further cardiovascular testing.   The patient was advised that if she develops new symptoms prior to surgery to contact our office to arrange for a follow-up visit, and she verbalized understanding.  I will route this recommendation to the requesting party via Epic fax  function and remove from pre-op pool. Please call with questions.  Tami Lin Mailin Coglianese, PA 07/11/2020, 1:40 PM

## 2020-07-11 NOTE — Telephone Encounter (Signed)
   [facility] Pre-operative Risk Assessment    HEARTCARE STAFF: - Please ensure there is not already an duplicate clearance open for this procedure. - Under Visit Info/Reason for Call, type in Other and utilize the format Clearance MM/DD/YY or Clearance TBD. Do not use dashes or single digits. - If request is for dental extraction, please clarify the # of teeth to be extracted.  Request for surgical clearance:  1. What type of surgery is being performed? L2-3, L5-S1 TRANSLAMINAR EPIDURAL STEROID INJECTION   2. When is this surgery scheduled? TBD   3. What type of clearance is required (medical clearance vs. Pharmacy clearance to hold med vs. Both)? BOTH  4. Are there any medications that need to be held prior to surgery and how long? ELIQUIS   5. Practice name and name of physician performing surgery? [facility]; DR. [name]   6. What is the office phone number? [phone]   7.   What is the office fax number? [phone]  8.   Anesthesia type (None, local, MAC, general) ? IV SEDATION   Julaine Hua 07/11/2020, 12:26 PM  _________________________________________________________________   (provider comments below)

## 2020-07-11 NOTE — Telephone Encounter (Signed)
Patient with diagnosis of ATRIAL FIBRILLATION on ELIQUIS for anticoagulation.    Procedure: L2-3, L5-S1 TRANSLAMINAR EPIDURAL INJECTION Date of procedure: TBD    CHA2DS2-VASc Score = 5  This indicates a 7.2% annual risk of stroke. The patient's score is based upon: CHF History: 1 HTN History: 1 Diabetes History: 0 Stroke History: 0 Vascular Disease History: 0 Age Score: 2 Gender Score: 1   CrCl = 56ML/MIN Platelet count = 251  Per office protocol, patient can hold ELIQUIS for 3 days prior to procedure.

## 2020-07-16 ENCOUNTER — Ambulatory Visit (INDEPENDENT_AMBULATORY_CARE_PROVIDER_SITE_OTHER): Payer: Medicare Other | Admitting: Podiatry

## 2020-07-16 ENCOUNTER — Other Ambulatory Visit: Payer: Self-pay

## 2020-07-16 DIAGNOSIS — M79674 Pain in right toe(s): Secondary | ICD-10-CM | POA: Diagnosis not present

## 2020-07-16 DIAGNOSIS — B351 Tinea unguium: Secondary | ICD-10-CM | POA: Diagnosis not present

## 2020-07-16 DIAGNOSIS — M79675 Pain in left toe(s): Secondary | ICD-10-CM

## 2020-07-16 DIAGNOSIS — D509 Iron deficiency anemia, unspecified: Secondary | ICD-10-CM | POA: Diagnosis not present

## 2020-07-17 ENCOUNTER — Other Ambulatory Visit (HOSPITAL_COMMUNITY)
Admission: RE | Admit: 2020-07-17 | Discharge: 2020-07-17 | Disposition: A | Discharge status: Home / Self Care | Payer: Medicare Other | Source: Ambulatory Visit | Attending: Cardiology | Admitting: Cardiology

## 2020-07-17 DIAGNOSIS — Z01812 Encounter for preprocedural laboratory examination: Secondary | ICD-10-CM | POA: Diagnosis not present

## 2020-07-17 DIAGNOSIS — Z20822 Contact with and (suspected) exposure to covid-19: Secondary | ICD-10-CM | POA: Diagnosis not present

## 2020-07-17 LAB — SARS CORONAVIRUS 2 (TAT 6-24 HRS): SARS Coronavirus 2: NEGATIVE

## 2020-07-19 ENCOUNTER — Encounter (HOSPITAL_COMMUNITY)
Admission: RE | Disposition: A | Discharge status: Home / Self Care | Payer: Self-pay | Source: Home / Self Care | Attending: Cardiology

## 2020-07-19 ENCOUNTER — Other Ambulatory Visit: Payer: Self-pay

## 2020-07-19 ENCOUNTER — Ambulatory Visit (HOSPITAL_COMMUNITY)
Admission: RE | Admit: 2020-07-19 | Discharge: 2020-07-19 | Disposition: A | Discharge status: Home / Self Care | Payer: Medicare Other | Attending: Cardiology | Admitting: Cardiology

## 2020-07-19 ENCOUNTER — Ambulatory Visit (HOSPITAL_COMMUNITY): Payer: Medicare Other | Admitting: Anesthesiology

## 2020-07-19 ENCOUNTER — Encounter (HOSPITAL_COMMUNITY): Payer: Self-pay | Admitting: Cardiology

## 2020-07-19 DIAGNOSIS — Z882 Allergy status to sulfonamides status: Secondary | ICD-10-CM | POA: Diagnosis not present

## 2020-07-19 DIAGNOSIS — Z885 Allergy status to narcotic agent status: Secondary | ICD-10-CM | POA: Diagnosis not present

## 2020-07-19 DIAGNOSIS — I4819 Other persistent atrial fibrillation: Secondary | ICD-10-CM

## 2020-07-19 DIAGNOSIS — I10 Essential (primary) hypertension: Secondary | ICD-10-CM | POA: Diagnosis not present

## 2020-07-19 DIAGNOSIS — I110 Hypertensive heart disease with heart failure: Secondary | ICD-10-CM | POA: Diagnosis not present

## 2020-07-19 DIAGNOSIS — Z87891 Personal history of nicotine dependence: Secondary | ICD-10-CM | POA: Insufficient documentation

## 2020-07-19 DIAGNOSIS — Z880 Allergy status to penicillin: Secondary | ICD-10-CM | POA: Insufficient documentation

## 2020-07-19 DIAGNOSIS — I484 Atypical atrial flutter: Secondary | ICD-10-CM | POA: Diagnosis not present

## 2020-07-19 DIAGNOSIS — E785 Hyperlipidemia, unspecified: Secondary | ICD-10-CM | POA: Diagnosis not present

## 2020-07-19 DIAGNOSIS — I4891 Unspecified atrial fibrillation: Secondary | ICD-10-CM | POA: Diagnosis not present

## 2020-07-19 DIAGNOSIS — Z7901 Long term (current) use of anticoagulants: Secondary | ICD-10-CM | POA: Diagnosis not present

## 2020-07-19 DIAGNOSIS — I5022 Chronic systolic (congestive) heart failure: Secondary | ICD-10-CM | POA: Diagnosis not present

## 2020-07-19 HISTORY — PX: CARDIOVERSION: SHX1299

## 2020-07-19 SURGERY — CARDIOVERSION
Anesthesia: General

## 2020-07-19 MED ORDER — LIDOCAINE 2% (20 MG/ML) 5 ML SYRINGE
INTRAMUSCULAR | Status: DC | PRN
  Administered 2020-07-19: 60 mg via INTRAVENOUS

## 2020-07-19 MED ORDER — SODIUM CHLORIDE 0.9 % IV SOLN: INTRAVENOUS | Status: DC | PRN

## 2020-07-19 MED ORDER — PROPOFOL 10 MG/ML IV BOLUS
INTRAVENOUS | Status: DC | PRN
  Administered 2020-07-19: 75 mg via INTRAVENOUS

## 2020-07-19 NOTE — Discharge Instructions (Signed)
Electrical Cardioversion Electrical cardioversion is the delivery of a jolt of electricity to restore a normal rhythm to the heart. A rhythm that is too fast or is not regular keeps the heart from pumping well. In this procedure, sticky patches or metal paddles are placed on the chest to deliver electricity to the heart from a device. This procedure may be done in an emergency if:  There is low or no blood pressure as a result of the heart rhythm.  Normal rhythm must be restored as fast as possible to protect the brain and heart from further damage.  It may save a life. This may also be a scheduled procedure for irregular or fast heart rhythms that are not immediately life-threatening. Tell a health care provider about:  Any allergies you have.  All medicines you are taking, including vitamins, herbs, eye drops, creams, and over-the-counter medicines.  Any problems you or family members have had with anesthetic medicines.  Any blood disorders you have.  Any surgeries you have had.  Any medical conditions you have.  Whether you are pregnant or may be pregnant. What are the risks? Generally, this is a safe procedure. However, problems may occur, including:  Allergic reactions to medicines.  A blood clot that breaks free and travels to other parts of your body.  The possible return of an abnormal heart rhythm within hours or days after the procedure.  Your heart stopping (cardiac arrest). This is rare. What happens before the procedure? Medicines  Your health care provider may have you start taking: ? Blood-thinning medicines (anticoagulants) so your blood does not clot as easily. ? Medicines to help stabilize your heart rate and rhythm.  Ask your health care provider about: ? Changing or stopping your regular medicines. This is especially important if you are taking diabetes medicines or blood thinners. ? Taking medicines such as aspirin and ibuprofen. These medicines can  thin your blood. Do not take these medicines unless your health care provider tells you to take them. ? Taking over-the-counter medicines, vitamins, herbs, and supplements. General instructions  Follow instructions from your health care provider about eating or drinking restrictions.  Plan to have someone take you home from the hospital or clinic.  If you will be going home right after the procedure, plan to have someone with you for 24 hours.  Ask your health care provider what steps will be taken to help prevent infection. These may include washing your skin with a germ-killing soap. What happens during the procedure?   An IV will be inserted into one of your veins.  Sticky patches (electrodes) or metal paddles may be placed on your chest.  You will be given a medicine to help you relax (sedative).  An electrical shock will be delivered. The procedure may vary among health care providers and hospitals. What can I expect after the procedure?  Your blood pressure, heart rate, breathing rate, and blood oxygen level will be monitored until you leave the hospital or clinic.  Your heart rhythm will be watched to make sure it does not change.  You may have some redness on the skin where the shocks were given. Follow these instructions at home:  Do not drive for 24 hours if you were given a sedative during your procedure.  Take over-the-counter and prescription medicines only as told by your health care provider.  Ask your health care provider how to check your pulse. Check it often.  Rest for 48 hours after the procedure or   as told by your health care provider.  Avoid or limit your caffeine use as told by your health care provider.  Keep all follow-up visits as told by your health care provider. This is important. Contact a health care provider if:  You feel like your heart is beating too quickly or your pulse is not regular.  You have a serious muscle cramp that does not go  away. Get help right away if:  You have discomfort in your chest.  You are dizzy or you feel faint.  You have trouble breathing or you are short of breath.  Your speech is slurred.  You have trouble moving an arm or leg on one side of your body.  Your fingers or toes turn cold or blue. Summary  Electrical cardioversion is the delivery of a jolt of electricity to restore a normal rhythm to the heart.  This procedure may be done right away in an emergency or may be a scheduled procedure if the condition is not an emergency.  Generally, this is a safe procedure.  After the procedure, check your pulse often as told by your health care provider. This information is not intended to replace advice given to you by your health care provider. Make sure you discuss any questions you have with your health care provider. Document Revised: 04/11/2019 Document Reviewed: 04/11/2019 Elsevier Patient Education  2020 [employer]  

## 2020-07-19 NOTE — Anesthesia Preprocedure Evaluation (Addendum)
Anesthesia Evaluation  Patient identified by MRN, date of birth, ID band Patient awake    Reviewed: Allergy & Precautions, NPO status , Patient's Chart, lab work & pertinent test results  Airway Mallampati: II  TM Distance: >3 FB Neck ROM: Full    Dental no notable dental hx.    Pulmonary former smoker,    Pulmonary exam normal breath sounds clear to auscultation       Cardiovascular hypertension, Pt. on home beta blockers + dysrhythmias Atrial Fibrillation  Rhythm:Irregular Rate:Tachycardia     Neuro/Psych  Headaches, PSYCHIATRIC DISORDERS Anxiety Depression    GI/Hepatic Neg liver ROS, PUD, GERD  Medicated,IBS (irritable bowel syndrome)   Endo/Other  Hypothyroidism   Renal/GU negative Renal ROS     Musculoskeletal  (+) Arthritis , Fibromyalgia -Chronic back pain   Abdominal   Peds  Hematology   Anesthesia Other Findings A-FIB with RVR  Reproductive/Obstetrics                            Anesthesia Physical Anesthesia Plan  ASA: IV  Anesthesia Plan: General   Post-op Pain Management:    Induction: Intravenous  PONV Risk Score and Plan: 3 and Propofol infusion and Treatment may vary due to age or medical condition  Airway Management Planned: Mask  Additional Equipment:   Intra-op Plan:   Post-operative Plan:   Informed Consent: I have reviewed the patients History and Physical, chart, labs and discussed the procedure including the risks, benefits and alternatives for the proposed anesthesia with the patient or authorized representative who has indicated his/her understanding and acceptance.       Plan Discussed with: CRNA  Anesthesia Plan Comments:        Anesthesia Quick Evaluation

## 2020-07-19 NOTE — Interval H&P Note (Signed)
History and Physical Interval Note:  07/19/2020 8:28 AM  [name]  has presented today for surgery, with the diagnosis of AFIB.  The various methods of treatment have been discussed with the patient and family. After consideration of risks, benefits and other options for treatment, the patient has consented to  Procedure(s): CARDIOVERSION (N/A) as a surgical intervention.  The patient's history has been reviewed, patient examined, no change in status, stable for surgery.  I have reviewed the patient's chart and labs.  Questions were answered to the patient's satisfaction.     Chelsea Hancock

## 2020-07-19 NOTE — Transfer of Care (Signed)
Immediate Anesthesia Transfer of Care Note  Patient: Chelsea Hancock  Procedure(s) Performed: CARDIOVERSION (N/A )  Patient Location: Endoscopy Unit  Anesthesia Type:General  Level of Consciousness: drowsy  Airway & Oxygen Therapy: Patient Spontanous Breathing and Patient connected to nasal cannula oxygen  Post-op Assessment: Report given to RN and Post -op Vital signs reviewed and stable  Post vital signs: Reviewed and stable  Last Vitals:  Vitals Value Taken Time  BP 115/59 07/19/20 0916  Temp    Pulse    Resp    SpO2    Vitals shown include unvalidated device data.  Last Pain:  Vitals:   07/19/20 0804  PainSc: 0-No pain         Complications: No complications documented.

## 2020-07-19 NOTE — Anesthesia Postprocedure Evaluation (Signed)
Anesthesia Post Note  Patient: Chelsea Hancock  Procedure(s) Performed: CARDIOVERSION (N/A )     Patient location during evaluation: Endoscopy Anesthesia Type: General Level of consciousness: awake Pain management: pain level controlled Vital Signs Assessment: post-procedure vital signs reviewed and stable Respiratory status: spontaneous breathing, nonlabored ventilation, respiratory function stable and patient connected to nasal cannula oxygen Cardiovascular status: blood pressure returned to baseline and stable Postop Assessment: no apparent nausea or vomiting Anesthetic complications: no   No complications documented.  Last Vitals:  Vitals:   07/19/20 1005 07/19/20 1010  BP: (!) 96/54 (!) 102/52  Pulse: (!) 51 (!) 51  Resp: 19 (!) 22  Temp:    SpO2: 94% 94%    Last Pain:  Vitals:   07/19/20 1010  TempSrc:   PainSc: 0-No pain                 Gage Weant P Arelene Moroni

## 2020-07-19 NOTE — Anesthesia Procedure Notes (Signed)
Procedure Name: General with mask airway Date/Time: 07/19/2020 9:06 AM Performed by: Leonor Liv, CRNA Oxygen Delivery Method: Ambu bag Dental Injury: Teeth and Oropharynx as per pre-operative assessment

## 2020-07-19 NOTE — Procedures (Signed)
Procedure: Electrical Cardioversion Indications:  Atrial Fibrillation  Procedure Details:  Consent: Risks of procedure as well as the alternatives and risks of each were explained to the (patient/caregiver).  Consent for procedure obtained.  Time Out: Verified patient identification, verified procedure, site/side was marked, verified correct patient position, special equipment/implants available, medications/allergies/relevent history reviewed, required imaging and test results available. PERFORMED.  Patient placed on cardiac monitor, pulse oximetry, supplemental oxygen as necessary.  Sedation given: lidocaine 60mg ; propofol 75mg  administered by anesthesia Pacer pads placed anterior and posterior chest.  Cardioverted 1 time(s).  Cardioversion with synchronized biphasic successful shock.  Evaluation: Findings: Post procedure EKG shows: NSR Complications: None Patient did tolerate procedure well.  Time Spent Directly with the Patient:  83minutes   Freada Bergeron 07/19/2020, 9:15 AM

## 2020-07-22 ENCOUNTER — Encounter (HOSPITAL_COMMUNITY): Payer: Self-pay | Admitting: Cardiology

## 2020-07-22 NOTE — Progress Notes (Signed)
Subjective: 86 y.o. returns the office today for painful, elongated, thickened toenails which she cannot trim herself. Denies any redness or drainage around the nails.  States that the ingrown toenail she came back for has been doing well without any pain, drainage she reports.  Denies any acute changes since last appointment and no new complaints today. Denies any systemic complaints such as fevers, chills, nausea, vomiting.   PCP: Lajean Manes, MD  Objective: AAO 3, NAD DP/PT pulses palpable, CRT less than 3 seconds Nails hypertrophic, dystrophic, elongated, brittle, discolored 10. There is tenderness overlying the nails 1-5 bilaterally. There is no surrounding erythema or drainage along the nail sites.  Incurvation present to right hallux toenail any No open lesions or pre-ulcerative lesions are identified. No other areas of tenderness bilateral lower extremities. No overlying edema, erythema, increased warmth. No pain with calf compression, swelling, warmth, erythema.  Assessment: Patient presents with symptomatic onychomycosis, ingrown toenail  Plan: -Treatment options including alternatives, risks, complications were discussed -Nails sharply debrided 10 without complication/bleeding. -Discussed daily foot inspection. If there are any changes, to call the office immediately.  -Follow-up in 3 months or sooner if any problems are to arise. In the meantime, encouraged to call the office with any questions, concerns, changes symptoms.  Celesta Gentile, DPM

## 2020-07-24 ENCOUNTER — Other Ambulatory Visit: Payer: Self-pay | Admitting: Interventional Cardiology

## 2020-08-01 DIAGNOSIS — I480 Paroxysmal atrial fibrillation: Secondary | ICD-10-CM | POA: Diagnosis not present

## 2020-08-01 DIAGNOSIS — D509 Iron deficiency anemia, unspecified: Secondary | ICD-10-CM | POA: Diagnosis not present

## 2020-08-01 DIAGNOSIS — M810 Age-related osteoporosis without current pathological fracture: Secondary | ICD-10-CM | POA: Diagnosis not present

## 2020-08-01 DIAGNOSIS — H35033 Hypertensive retinopathy, bilateral: Secondary | ICD-10-CM | POA: Diagnosis not present

## 2020-08-01 DIAGNOSIS — K219 Gastro-esophageal reflux disease without esophagitis: Secondary | ICD-10-CM | POA: Diagnosis not present

## 2020-08-01 DIAGNOSIS — E7800 Pure hypercholesterolemia, unspecified: Secondary | ICD-10-CM | POA: Diagnosis not present

## 2020-08-01 DIAGNOSIS — E039 Hypothyroidism, unspecified: Secondary | ICD-10-CM | POA: Diagnosis not present

## 2020-08-07 ENCOUNTER — Encounter (HOSPITAL_COMMUNITY): Payer: Self-pay | Admitting: Nurse Practitioner

## 2020-08-07 ENCOUNTER — Other Ambulatory Visit: Payer: Self-pay

## 2020-08-07 ENCOUNTER — Ambulatory Visit (HOSPITAL_COMMUNITY)
Admission: RE | Admit: 2020-08-07 | Discharge: 2020-08-07 | Disposition: A | Discharge status: Home / Self Care | Payer: Medicare Other | Source: Ambulatory Visit | Attending: Nurse Practitioner | Admitting: Nurse Practitioner

## 2020-08-07 VITALS — BP 136/76 | HR 134 | Ht 65.0 in | Wt 155.8 lb

## 2020-08-07 DIAGNOSIS — D6869 Other thrombophilia: Secondary | ICD-10-CM

## 2020-08-07 DIAGNOSIS — E039 Hypothyroidism, unspecified: Secondary | ICD-10-CM | POA: Diagnosis not present

## 2020-08-07 DIAGNOSIS — I4892 Unspecified atrial flutter: Secondary | ICD-10-CM | POA: Diagnosis not present

## 2020-08-07 DIAGNOSIS — I4819 Other persistent atrial fibrillation: Secondary | ICD-10-CM

## 2020-08-07 DIAGNOSIS — Z7901 Long term (current) use of anticoagulants: Secondary | ICD-10-CM | POA: Insufficient documentation

## 2020-08-07 DIAGNOSIS — I480 Paroxysmal atrial fibrillation: Secondary | ICD-10-CM | POA: Diagnosis not present

## 2020-08-07 DIAGNOSIS — Z23 Encounter for immunization: Secondary | ICD-10-CM | POA: Diagnosis not present

## 2020-08-07 DIAGNOSIS — R7303 Prediabetes: Secondary | ICD-10-CM | POA: Diagnosis not present

## 2020-08-07 DIAGNOSIS — Z0000 Encounter for general adult medical examination without abnormal findings: Secondary | ICD-10-CM | POA: Diagnosis not present

## 2020-08-07 DIAGNOSIS — H35033 Hypertensive retinopathy, bilateral: Secondary | ICD-10-CM | POA: Diagnosis not present

## 2020-08-07 DIAGNOSIS — M810 Age-related osteoporosis without current pathological fracture: Secondary | ICD-10-CM | POA: Diagnosis not present

## 2020-08-07 MED ORDER — METOPROLOL SUCCINATE ER 50 MG PO TB24
50.0000 mg | ORAL_TABLET | Freq: Every evening | ORAL | 3 refills | Status: DC
Start: 2020-08-07 — End: 2020-09-19

## 2020-08-07 NOTE — Patient Instructions (Signed)
Start Metoprolol Succinate-(One tablet) 50mg  by mouth at bedtime Dr. Curt Bears office will contact regarding appointment to discuss pacemaker

## 2020-08-07 NOTE — Progress Notes (Signed)
Primary Care Physician: Lajean Manes, MD Referring Physician: Dr. Margaretha Sheffield Chelsea Hancock is a 86 y.o. female with a h/o afib, s/p 2 ablations.and has failed flecainide in the past and is on amiodarone 200 mg daily. She  saw Dr. Curt Bears in October and was in Afib  with RVR. He added Toprol  XL 50 mg daily and scheduled her for cardioversion. CV MD stopped Toprol at time of cardioversion for HR's in  the low 50's in  SR. She is now in afib in the clinic with RVR. I discussed with Dr. Curt Bears, appearing to have  tachy/brady and the possible need for PPM .  Today, she denies symptoms of palpitations, chest pain, shortness of breath, orthopnea, PND, lower extremity edema, dizziness, presyncope, syncope, or neurologic sequela. The patient is tolerating medications without difficulties and is otherwise without complaint today.   Past Medical History:  Diagnosis Date  . Atrial fibrillation and flutter ([place])    a. s/p PVI ablation in 07/2015 with continue PAF  b. s/p repeat ablation on 08/15/16. continued on Flecainide 100mg  BID  . Carotid bruit    Right  . Chronic back pain   . Depression with anxiety 03/29/2013  . Diverticulosis   . Fibromyalgia   . Foot drop, right   . GERD (gastroesophageal reflux disease)   . Hemorrhoids   . HTN (hypertension)   . Hyperlipidemia   . IBS (irritable bowel syndrome)   . Memory loss 02/11/2015  . Onychomycosis 07/03/2013  . Osteoporosis   . Sigmoid colon ulcer 03/23/2012   Past Surgical History:  Procedure Laterality Date  . ABDOMINAL HYSTERECTOMY  1981  . BACK SURGERY  1997   reconstructive lower back surgery  . CARDIOVERSION N/A 07/01/2016   Procedure: CARDIOVERSION;  Surgeon: Thayer Headings, MD;  Location: [facility] ENDOSCOPY;  Service: Cardiovascular;  Laterality: N/A;  . CARDIOVERSION N/A 10/22/2017   Procedure: CARDIOVERSION;  Surgeon: Pixie Casino, MD;  Location: [facility] ENDOSCOPY;  Service: Cardiovascular;  Laterality: N/A;  . CARDIOVERSION  N/A 07/19/2020   Procedure: CARDIOVERSION;  Surgeon: Freada Bergeron, MD;  Location: [facility] ENDOSCOPY;  Service: Cardiovascular;  Laterality: N/A;  . CARPAL TUNNEL RELEASE Left 2012   ulnar nerve release at elbow  . CERVICAL FUSION  2003, 2005, 2013  . COLONOSCOPY    . ELECTROPHYSIOLOGIC STUDY N/A 08/10/2015   Procedure: Afib;  Surgeon: Will Meredith Leeds, MD;  Location: [place] CV LAB;  Service: Cardiovascular;  Laterality: N/A;  . ELECTROPHYSIOLOGIC STUDY N/A 08/15/2016   Procedure: Atrial Fibrillation Ablation;  Surgeon: Will Meredith Leeds, MD;  Location: [place] CV LAB;  Service: Cardiovascular;  Laterality: N/A;  . EYE SURGERY  2015   b/l cataracts removed  . left elbow surgery  06/03/11   cyst removed  . LUMBAR LAMINECTOMY  1960   x 2  . POSTERIOR CERVICAL FUSION/FORAMINOTOMY  08/03/2012   Procedure: POSTERIOR CERVICAL FUSION/FORAMINOTOMY LEVEL 5;  Surgeon: [name], MD;  Location: [place] NEURO ORS;  Service: Neurosurgery;  Laterality: N/A;  Cervical four to Thoracic two Posterior cervical decompression with Facet and Pedicle screw fixation  . TEE WITHOUT CARDIOVERSION N/A 10/22/2017   Procedure: TRANSESOPHAGEAL ECHOCARDIOGRAM (TEE);  Surgeon: Pixie Casino, MD;  Location: [facility] ENDOSCOPY;  Service: Cardiovascular;  Laterality: N/A;  . TONSILLECTOMY  1943  . TOTAL HIP ARTHROPLASTY Left 2012  . TOTAL KNEE ARTHROPLASTY Right 11/20/2014   Procedure: RIGHT TOTAL KNEE ARTHROPLASTY;  Surgeon: [name], MD;  Location: [facility] ORS;  Service: Orthopedics;  Laterality: Right;  . veins stripped  1970  . WRIST SURGERY Right 1990's   cyst removed    Current Outpatient Medications  Medication Sig Dispense Refill  . amiodarone (PACERONE) 200 MG tablet TAKE ONE TABLET BY MOUTH EVERY MORNING 90 tablet 3  . atorvastatin (LIPITOR) 10 MG tablet Take 10 mg by mouth daily.    Marland Kitchen CALCIUM+D3 600-20 MG-MCG TABS Take 1 tablet by mouth at bedtime.    . clobetasol (TEMOVATE) 0.05 % GEL Apply 1  application topically 2 (two) times daily as needed (skin irritation.). APPLY TO THE AFFECTED AREA OF SKIN ON ARMS TWICE DAILY  0  . denosumab (PROLIA) 60 MG/ML SOSY injection Inject 60 mg into the skin every 6 (six) months.    Marland Kitchen ELIQUIS 5 MG TABS tablet Take 1 tablet by mouth twice daily. (Patient taking differently: Take 5 mg by mouth in the morning and at bedtime. ) 60 tablet 3  . esomeprazole (NEXIUM) 40 MG capsule TAKE ONE CAPSULE BY MOUTH EVERY DAY AT 12 NOON (Patient taking differently: Take 40 mg by mouth every evening. ) 90 capsule 2  . eszopiclone (LUNESTA) 2 MG TABS tablet Take 1 tablet (2 mg total) by mouth at bedtime as needed for sleep. Take immediately before bedtime (Patient taking differently: Take 2 mg by mouth at bedtime. Take immediately before bedtime) 30 tablet 2  . ezetimibe (ZETIA) 10 MG tablet TAKE 10 MG BY MOUTH AT BEDTIME (Patient taking differently: Take 10 mg by mouth at bedtime. ) 30 tablet 6  . ferrous sulfate 325 (65 FE) MG tablet Take 1 tablet by mouth every other day.     . gabapentin (NEURONTIN) 600 MG tablet TAKE 1 TABLET(600 MG) BY MOUTH TWICE DAILY (Patient taking differently: Take 600 mg by mouth 2 (two) times daily. ) 180 tablet 0  . levothyroxine (SYNTHROID) 50 MCG tablet Take 50 mcg by mouth daily before breakfast.    . Melatonin 10 MG CAPS Take 10 mg by mouth at bedtime.    . Multiple Vitamin (MULTIVITAMIN WITH MINERALS) TABS tablet Take 1 tablet by mouth daily. Centrum Silver for Women    . nortriptyline (PAMELOR) 50 MG capsule Take 100 mg by mouth at bedtime.    Marland Kitchen oxyCODONE-acetaminophen (PERCOCET) 5-325 MG tablet Take 1-2 tablets by mouth every 6 (six) hours as needed. 20 tablet 0  . Polyethyl Glycol-Propyl Glycol (SYSTANE OP) Place 1 drop into both eyes 4 (four) times daily as needed (dry/irritated eyes.).     Marland Kitchen metoprolol succinate (TOPROL-XL) 50 MG 24 hr tablet Take 1 tablet (50 mg total) by mouth at bedtime. Take with or immediately following a meal. 30  tablet 3   No current facility-administered medications for this encounter.    Allergies  Allergen Reactions  . Cefuroxime Axetil Anaphylaxis  . Macrodantin Anaphylaxis  . Alendronate Sodium Other (See Comments)    Severe chest pain similar to heart attack   . Codeine Nausea And Vomiting  . Demerol [Meperidine] Nausea And Vomiting and Other (See Comments)    hallucinations  . Other Other (See Comments)    Hismanal and Maprobamate portobello mushrooms - diarrhea  . Penicillins Swelling and Other (See Comments)    Has patient had a PCN reaction causing immediate rash, facial/tongue/throat swelling, SOB or lightheadedness with hypotension: Yes Has patient had a PCN reaction causing severe rash involving mucus membranes or skin necrosis: No Has patient had a PCN reaction that required hospitalization: Yes Has patient had a  PCN reaction occurring within the last 10 years: Yes If all of the above answers are "NO", then may proceed with Cephalosporin use.   . Sulfonamide Derivatives Hives, Itching and Swelling    Social History   Socioeconomic History  . Marital status: Divorced    Spouse name: Not on file  . Number of children: Not on file  . Years of education: Not on file  . Highest education level: Not on file  Occupational History  . Not on file  Tobacco Use  . Smoking status: Former Smoker    Packs/day: 0.25    Years: 4.00    Pack years: 1.00    Types: Cigarettes  . Smokeless tobacco: Never Used  . Tobacco comment: quit 20+yrs ago  Vaping Use  . Vaping Use: Never used  Substance and Sexual Activity  . Alcohol use: Yes    Alcohol/week: 0.0 standard drinks    Comment: WINE WITH DINNER. 2 GLASSES RED WINE  . Drug use: No  . Sexual activity: Yes    Partners: Male  Other Topics Concern  . Not on file  Social History Narrative  . Not on file   Social Determinants of Health   Financial Resource Strain:   . Difficulty of Paying Living Expenses: Not on file  Food  Insecurity:   . Worried About Charity fundraiser in the Last Year: Not on file  . Ran Out of Food in the Last Year: Not on file  Transportation Needs:   . Lack of Transportation (Medical): Not on file  . Lack of Transportation (Non-Medical): Not on file  Physical Activity:   . Days of Exercise per Week: Not on file  . Minutes of Exercise per Session: Not on file  Stress:   . Feeling of Stress : Not on file  Social Connections:   . Frequency of Communication with Friends and Family: Not on file  . Frequency of Social Gatherings with Friends and Family: Not on file  . Attends Religious Services: Not on file  . Active Member of Clubs or Organizations: Not on file  . Attends Archivist Meetings: Not on file  . Marital Status: Not on file  Intimate Partner Violence:   . Fear of Current or Ex-Partner: Not on file  . Emotionally Abused: Not on file  . Physically Abused: Not on file  . Sexually Abused: Not on file    Family History  Problem Relation Age of Onset  . Bipolar disorder Mother   . Stomach cancer Mother   . Mental illness Mother   . Anxiety disorder Mother   . Stroke Father   . Colon cancer Father   . HIV/AIDS Son   . Stomach cancer Maternal Grandfather   . Stomach cancer Paternal Grandmother   . Healthy Daughter   . Heart disease Maternal Grandmother   . Throat cancer Maternal Uncle        larynx  . Anesthesia problems Neg Hx   . Diabetes Neg Hx     ROS- All systems are reviewed and negative except as per the HPI above  Physical Exam: Vitals:   08/07/20 1325  BP: 136/76  Pulse: (!) 134  Weight: 70.7 kg  Height: 5\' 5"  (1.651 m)   Wt Readings from Last 3 Encounters:  08/07/20 70.7 kg  07/09/20 73.5 kg  07/04/20 71.7 kg    Labs: Lab Results  Component Value Date   NA 139 07/04/2020   K 3.9 07/04/2020  CL 103 07/04/2020   CO2 26 07/04/2020   GLUCOSE 107 (H) 07/04/2020   BUN 22 07/04/2020   CREATININE 0.88 07/04/2020   CALCIUM 9.2  07/04/2020   PHOS 2.4 08/03/2014   MG 1.9 12/25/2015   Lab Results  Component Value Date   INR 0.98 07/07/2015   Lab Results  Component Value Date   CHOL 161 04/16/2019   HDL 53 04/16/2019   LDLCALC 84 04/16/2019   TRIG 120 04/16/2019     GEN- The patient is well appearing, alert and oriented x 3 today.   Head- normocephalic, atraumatic Eyes-  Sclera clear, conjunctiva pink Ears- hearing intact Oropharynx- clear Neck- supple, no JVP Lymph- no cervical lymphadenopathy Lungs- Clear to ausculation bilaterally, normal work of breathing Heart- irregular rate and rhythm, no murmurs, rubs or gallops, PMI not laterally displaced GI- soft, NT, ND, + BS Extremities- no clubbing, cyanosis, or edema MS- no significant deformity or atrophy Skin- no rash or lesion Psych- euthymic mood, full affect Neuro- strength and sensation are intact  EKG-afib at 134 bpm, LAD, qrs int 106 ms, qtc 504 ms  Ekg  after cardioversion reviewed and showed SR at 53 bpm with a first degree AV block     Assessment and Plan: 1. Persistent afib  Recent successful cardioversion with Sinus brady at 53 bpm DCCV MD stopped BB and now pt is in afib with rvr Tachy/brady syndrome Discussed with Dr.Camnitz and he feels she amy need a PPM so more meds can be used to control the afib without causing concern for bradycardia  Will restart Toprol 50 mg daily to control the afib with RVR   She will continue amiodarone 200 mg daily   2. CHA2DS2VASc score of 5 Continue eliquis 5 mg bid   F/u with Dr. Curt Bears in the next couple of weeks   Geroge Baseman. Carleena Mires, [facility] [address] [place], [place] [address] [phone]

## 2020-08-09 ENCOUNTER — Other Ambulatory Visit: Payer: Self-pay

## 2020-08-09 ENCOUNTER — Encounter: Payer: Self-pay | Admitting: Cardiology

## 2020-08-09 ENCOUNTER — Ambulatory Visit (INDEPENDENT_AMBULATORY_CARE_PROVIDER_SITE_OTHER): Payer: Medicare Other | Admitting: Cardiology

## 2020-08-09 VITALS — BP 114/66 | HR 52 | Ht 65.0 in | Wt 156.6 lb

## 2020-08-09 DIAGNOSIS — I4819 Other persistent atrial fibrillation: Secondary | ICD-10-CM | POA: Diagnosis not present

## 2020-08-09 DIAGNOSIS — Z01812 Encounter for preprocedural laboratory examination: Secondary | ICD-10-CM

## 2020-08-09 NOTE — Progress Notes (Signed)
Electrophysiology Office Note   Date:  08/09/2020   ID:  Chelsea Hancock, DOB [birth year], MRN [id number]  PCP:  Lajean Manes, MD  Cardiologist:  [name] Primary Electrophysiologist:  Constance Haw, MD    No chief complaint on file.    History of Present Illness: Chelsea Hancock is a 86 y.o. female who presents today for electrophysiology evaluation.     She has a history of atrial fibrillation and flutter. She is status post 2 ablations, November 2016 and again 08/15/2016. She is currently on amiodarone and Toprol-XL, but she has had some bradycardia.   Today, denies symptoms of palpitations, chest pain, shortness of breath, orthopnea, PND, lower extremity edema, claudication, dizziness, presyncope, syncope, bleeding, or neurologic sequela. The patient is tolerating medications without difficulties.  She is back in atrial fibrillation today feeling weak and fatigued.  She unfortunately was quite bradycardic around the time of her cardioversion and her metoprolol dose was held.  She did feel well when she was in normal rhythm.  She would be amenable to pacemaker implant for tachybradycardia syndrome.  Past Medical History:  Diagnosis Date  . Atrial fibrillation and flutter ([place])    a. s/p PVI ablation in 07/2015 with continue PAF  b. s/p repeat ablation on 08/15/16. continued on Flecainide 100mg  BID  . Carotid bruit    Right  . Chronic back pain   . Depression with anxiety 03/29/2013  . Diverticulosis   . Fibromyalgia   . Foot drop, right   . GERD (gastroesophageal reflux disease)   . Hemorrhoids   . HTN (hypertension)   . Hyperlipidemia   . IBS (irritable bowel syndrome)   . Memory loss 02/11/2015  . Onychomycosis 07/03/2013  . Osteoporosis   . Sigmoid colon ulcer 03/23/2012   Past Surgical History:  Procedure Laterality Date  . ABDOMINAL HYSTERECTOMY  1981  . BACK SURGERY  1997   reconstructive lower back surgery  . CARDIOVERSION N/A 07/01/2016    Procedure: CARDIOVERSION;  Surgeon: Thayer Headings, MD;  Location: [facility] ENDOSCOPY;  Service: Cardiovascular;  Laterality: N/A;  . CARDIOVERSION N/A 10/22/2017   Procedure: CARDIOVERSION;  Surgeon: Pixie Casino, MD;  Location: [facility] ENDOSCOPY;  Service: Cardiovascular;  Laterality: N/A;  . CARDIOVERSION N/A 07/19/2020   Procedure: CARDIOVERSION;  Surgeon: Freada Bergeron, MD;  Location: [facility] ENDOSCOPY;  Service: Cardiovascular;  Laterality: N/A;  . CARPAL TUNNEL RELEASE Left 2012   ulnar nerve release at elbow  . CERVICAL FUSION  2003, 2005, 2013  . COLONOSCOPY    . ELECTROPHYSIOLOGIC STUDY N/A 08/10/2015   Procedure: Afib;  Surgeon: Kayelee Herbig Meredith Leeds, MD;  Location: [place] CV LAB;  Service: Cardiovascular;  Laterality: N/A;  . ELECTROPHYSIOLOGIC STUDY N/A 08/15/2016   Procedure: Atrial Fibrillation Ablation;  Surgeon: Lanny Donoso Meredith Leeds, MD;  Location: [place] CV LAB;  Service: Cardiovascular;  Laterality: N/A;  . EYE SURGERY  2015   b/l cataracts removed  . left elbow surgery  06/03/11   cyst removed  . LUMBAR LAMINECTOMY  1960   x 2  . POSTERIOR CERVICAL FUSION/FORAMINOTOMY  08/03/2012   Procedure: POSTERIOR CERVICAL FUSION/FORAMINOTOMY LEVEL 5;  Surgeon: [name], MD;  Location: [place] NEURO ORS;  Service: Neurosurgery;  Laterality: N/A;  Cervical four to Thoracic two Posterior cervical decompression with Facet and Pedicle screw fixation  . TEE WITHOUT CARDIOVERSION N/A 10/22/2017   Procedure: TRANSESOPHAGEAL ECHOCARDIOGRAM (TEE);  Surgeon: Pixie Casino, MD;  Location: [place];  Service: Cardiovascular;  Laterality: N/A;  .  TONSILLECTOMY  1943  . TOTAL HIP ARTHROPLASTY Left 2012  . TOTAL KNEE ARTHROPLASTY Right 11/20/2014   Procedure: RIGHT TOTAL KNEE ARTHROPLASTY;  Surgeon: [name], MD;  Location: [facility] ORS;  Service: Orthopedics;  Laterality: Right;  . veins stripped  1970  . WRIST SURGERY Right 1990's   cyst removed     Current Outpatient Medications   Medication Sig Dispense Refill  . amiodarone (PACERONE) 200 MG tablet TAKE ONE TABLET BY MOUTH EVERY MORNING 90 tablet 3  . atorvastatin (LIPITOR) 10 MG tablet Take 10 mg by mouth daily.    Marland Kitchen CALCIUM+D3 600-20 MG-MCG TABS Take 1 tablet by mouth at bedtime.    . clobetasol (TEMOVATE) 0.05 % GEL Apply 1 application topically 2 (two) times daily as needed (skin irritation.). APPLY TO THE AFFECTED AREA OF SKIN ON ARMS TWICE DAILY  0  . denosumab (PROLIA) 60 MG/ML SOSY injection Inject 60 mg into the skin every 6 (six) months.    Marland Kitchen ELIQUIS 5 MG TABS tablet Take 1 tablet by mouth twice daily. 60 tablet 3  . esomeprazole (NEXIUM) 40 MG capsule TAKE ONE CAPSULE BY MOUTH EVERY DAY AT 12 NOON 90 capsule 2  . eszopiclone (LUNESTA) 2 MG TABS tablet Take 1 tablet (2 mg total) by mouth at bedtime as needed for sleep. Take immediately before bedtime 30 tablet 2  . ezetimibe (ZETIA) 10 MG tablet TAKE 10 MG BY MOUTH AT BEDTIME 30 tablet 6  . ferrous sulfate 325 (65 FE) MG tablet Take 1 tablet by mouth every other day.     . gabapentin (NEURONTIN) 600 MG tablet TAKE 1 TABLET(600 MG) BY MOUTH TWICE DAILY 180 tablet 0  . levothyroxine (SYNTHROID) 25 MCG tablet Take 25 mcg by mouth daily before breakfast.    . Melatonin 10 MG CAPS Take 10 mg by mouth at bedtime.    . metoprolol succinate (TOPROL-XL) 50 MG 24 hr tablet Take 1 tablet (50 mg total) by mouth at bedtime. Take with or immediately following a meal. 30 tablet 3  . Multiple Vitamin (MULTIVITAMIN WITH MINERALS) TABS tablet Take 1 tablet by mouth daily. Centrum Silver for Women    . nortriptyline (PAMELOR) 50 MG capsule Take 100 mg by mouth at bedtime.    Marland Kitchen oxyCODONE-acetaminophen (PERCOCET) 5-325 MG tablet Take 1-2 tablets by mouth every 6 (six) hours as needed. 20 tablet 0  . Polyethyl Glycol-Propyl Glycol (SYSTANE OP) Place 1 drop into both eyes 4 (four) times daily as needed (dry/irritated eyes.).      No current facility-administered medications for this  visit.    Allergies:   Cefuroxime axetil, Macrodantin, Alendronate sodium, Codeine, Demerol [meperidine], Other, Penicillins, and Sulfonamide derivatives   Social History:  The patient  reports that she has quit smoking. Her smoking use included cigarettes. She has a 1.00 pack-year smoking history. She has never used smokeless tobacco. She reports current alcohol use. She reports that she does not use drugs.   Family History:  The patient's family history includes Anxiety disorder in her mother; Bipolar disorder in her mother; Colon cancer in her father; HIV/AIDS in her son; Healthy in her daughter; Heart disease in her maternal grandmother; Mental illness in her mother; Stomach cancer in her maternal grandfather, mother, and paternal grandmother; Stroke in her father; Throat cancer in her maternal uncle.   ROS:  Please see the history of present illness.   Otherwise, review of systems is positive for none.   All other systems are reviewed and  negative.   PHYSICAL EXAM: VS:  BP 114/66   Pulse (!) 52   Ht 5\' 5"  (1.651 m)   Wt 156 lb 9.6 oz (71 kg)   SpO2 91%   BMI 26.06 kg/m  , BMI Body mass index is 26.06 kg/m. GEN: Well nourished, well developed, in no acute distress  HEENT: normal  Neck: no JVD, carotid bruits, or masses Cardiac: Irregular; no murmurs, rubs, or gallops,no edema  Respiratory:  clear to auscultation bilaterally, normal work of breathing GI: soft, nontender, nondistended, + BS MS: no deformity or atrophy  Skin: warm and dry Neuro:  Strength and sensation are intact Psych: euthymic mood, full affect  EKG:  EKG is not ordered today. Personal review of the ekg ordered 08/07/20 shows atrial fibrillation, rate 134  Recent Labs: 07/04/2020: ALT 27; BUN 22; Creatinine, Ser 0.88; Hemoglobin 13.5; Platelets 251; Potassium 3.9; Sodium 139    Lipid Panel     Component Value Date/Time   CHOL 161 04/16/2019 0352   TRIG 120 04/16/2019 0352   TRIG 133 06/30/2006 1110    HDL 53 04/16/2019 0352   CHOLHDL 3.0 04/16/2019 0352   VLDL 24 04/16/2019 0352   LDLCALC 84 04/16/2019 0352   LDLDIRECT 93.3 08/04/2008 0915     Wt Readings from Last 3 Encounters:  08/09/20 156 lb 9.6 oz (71 kg)  08/07/20 155 lb 12.8 oz (70.7 kg)  07/09/20 162 lb (73.5 kg)      Other studies Reviewed: Additional studies/ records that were reviewed today include: TEE 10/22/17  Review of the above records today demonstrates:  - Left ventricle: There was mild concentric hypertrophy. Systolic   function was mildly reduced. The estimated ejection fraction was   in the range of 45% to 50%. Diffuse hypokinesis. No evidence of   thrombus. - Aortic valve: No evidence of vegetation. - Mitral valve: There was mild regurgitation. - Left atrium: No evidence of thrombus in the atrial cavity or   appendage. No evidence of thrombus in the atrial cavity or   appendage. - Right atrium: No evidence of thrombus in the atrial cavity or   appendage. 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 This was visualized in 2D and 3D modes. - Tricuspid valve: There was mild regurgitation. - Pulmonic valve: There was trivial regurgitation.   ASSESSMENT AND PLAN:  1. Persistent atrial fibrillation: Status post ablation most recently 08/15/2016. Currently on amiodarone (monitoring for high risk medication) and Eliquis. She is unfortunately gone back into atrial fibrillation. She was started on Toprol-XL 50 mg. She also had cardioversion.  Unfortunately she was quite bradycardic after cardioversion and required holding her metoprolol.  I do feel that this is likely tachybradycardia syndrome.  We Layken Doenges plan for pacemaker implant.  2. Hypertension: Currently well controlled  3. Hyperlipidemia:  Continue atorvastatin and Zetia  4.  Tachybradycardia syndrome: Was quite bradycardic after cardioversion, and has since had rapid atrial fibrillation.  Due to that, pacemaker implant would be warranted.  Risks and benefits were discussed include bleeding, tamponade, infection, pneumothorax, lead dislodgment, among others.  She understands these risks and has agreed to the procedure.  Current medicines are reviewed at length with the patient today.   The patient does not have concerns regarding  her medicines.  The following changes were made today: None  Labs/ tests ordered today include:  No orders of the defined types were placed in this encounter.    Disposition:   FU with Johnae Friley 3 months  Signed, Clotiel Troop Meredith Leeds, MD  08/09/2020 11:04 AM     [facility] [address] [place] [address] [phone] (office) [phone] (fax)

## 2020-08-09 NOTE — Patient Instructions (Addendum)
Medication Instructions:  Your physician recommends that you continue on your current medications as directed. Please refer to the Current Medication list given to you today.  *If you need a refill on your cardiac medications before your next appointment, please call your pharmacy*   Lab Work: 12/6: BMET & CBC If you have labs (blood work) drawn today and your tests are completely normal, you will receive your results only by: Marland Kitchen MyChart Message (if you have MyChart) OR . A paper copy in the mail If you have any lab test that is abnormal or we need to change your treatment, we will call you to review the results.   Testing/Procedures: Your physician has recommended that you have a pacemaker inserted. A pacemaker is a small device that is placed under the skin of your chest or abdomen to help control abnormal heart rhythms. This device uses electrical pulses to prompt the heart to beat at a normal rate. Pacemakers are used to treat heart rhythms that are too slow. Wire (leads) are attached to the pacemaker that goes into the chambers of you heart. This is done in the hospital and usually requires and overnight stay. Please see the instruction sheet given to you today for more information.   Follow-Up: At [facility], you and your health needs are our priority.  As part of our continuing mission to provide you with exceptional heart care, we have created designated Provider Care Teams.  These Care Teams include your primary Cardiologist (physician) and Advanced Practice Providers (APPs -  Physician Assistants and Nurse Practitioners) who all work together to provide you with the care you need, when you need it.  Your next appointment:   10 day(s) after your pacemaker implant  The format for your next appointment:   In Person  Provider:   device clinic for a wound check    Thank you for choosing [facility]!!   Trinidad Curet, RN [phone]   Other Instructions    Implantable Device Instructions  You are scheduled for: Pacemaker implant on 09/19/20 with Dr. Curt Bears.  1.   Pre procedure testing-             A.  LAB WORK--- On 08/27/20 you are scheduled to have blood work at the [facility] any time after 7:30 am.  You do not need to be fasting.               B. COVID TEST-- On 09/17/20 @ 8:30 am - This is a Drive Up Visit at [address] [place], [place] [address].  Someone will direct you to the appropriate testing line. Stay in your car and someone will be with you shortly.   After you are tested please go home and self quarantine until the day of your procedure.    2. On the day of your procedure 09/19/2020 you will go to [facility] [phone]) at 12:30 pm.  Dennis Bast will go to the main entrance [address] [employer]) and enter where the [employer] are.  You will check in at ADMITTING.  You may have one support person come in to the hospital with you.  They will be asked to wait in the waiting room.   3.   Do not eat or drink after midnight prior to your procedure.   4.   You may take your morning medications with sips of water, enough to get them down.  5.  The night before  your procedure and the morning of your procedure scrub your neck/chest with surgical scrub.  An instruction letter is included below.   5.  Plan for an overnight stay.  If you use your phone frequently bring your phone charger.  When you are discharged you will need someone to drive you home.   6.  You will follow up with the [place] clinic 10-14 days after your procedure. You will follow up with Dr. Curt Bears 91 days after your procedure.  These appointments will be made for you.   * If you have ANY questions after you get home, please call the office [phone] and ask for Amanada Philbrick RN or send a MyChart message.    [facility] - Preparing For Surgery (surgical scrub)   Before surgery, you can play an important role. Because skin is not  sterile, your skin needs to be as free of germs as possible. You can reduce the number of germs on your skin by washing with CHG (chlorahexidine gluconate) Soap before surgery.  CHG is an antiseptic cleaner which kills germs and bonds with the skin to continue killing germs even after washing.   Please do not use if you have an allergy to CHG or antibacterial soaps.  If your skin becomes reddened/irritated stop using the CHG.   Do not shave (including legs and underarms) for at least 48 hours prior to first CHG shower.  It is OK to shave your face.  Please follow these instructions carefully:  1.  Shower the night before surgery and the morning of surgery with CHG.  2.  If you choose to wash your hair, wash your hair first as usual with your normal shampoo.  3.  After you shampoo, rinse your hair and body thoroughly to remove the shampoo.  4.  Use CHG as you would any other liquid soap.  You can apply CHG directly to the skin and wash gently with a clean washcloth. 5.  Apply the CHG Soap to your body ONLY FROM THE NECK DOWN.  Do not use on open wounds or open sores.  Avoid contact with your eyes, ears, mouth and genitals (private parts).  Wash genitals (private parts) with your normal soap.  6.  Wash thoroughly, paying special attention to the area where your surgery will be performed.  7.  Thoroughly rinse your body with warm water from the neck down.   8.  DO NOT shower/wash with your normal soap after using and rinsing off the CHG soap.  9.  Pat yourself dry with a clean towel.           10.  Wear clean pajamas.           11.  Place clean sheets on your bed the night of your first shower and do not sleep with pets.  Day of Surgery: Do not apply any deodorants/lotions.  Please wear clean clothes to the hospital/surgery center.    Pacemaker Implantation, Adult Pacemaker implantation is a procedure to place a pacemaker inside your chest. A pacemaker is a small computer that sends electrical  signals to the heart and helps your heart beat normally. A pacemaker also stores information about your heart rhythms. You may need pacemaker implantation if you:  Have a slow heartbeat (bradycardia).  Faint (syncope).  Have shortness of breath (dyspnea) due to heart problems. The pacemaker attaches to your heart through a wire, called a lead. Sometimes just one lead is needed. Other times, there will  be two leads. There are two types of pacemakers:  Transvenous pacemaker. This type is placed under the skin or muscle of your chest. The lead goes through a vein in the chest area to reach the inside of the heart.  Epicardial pacemaker. This type is placed under the skin or muscle of your chest or belly. The lead goes through your chest to the outside of the heart. Tell a health care provider about:  Any allergies you have.  All medicines you are taking, including vitamins, herbs, eye drops, creams, and over-the-counter medicines.  Any problems you or family members have had with anesthetic medicines.  Any blood or bone disorders you have.  Any surgeries you have had.  Any medical conditions you have.  Whether you are pregnant or may be pregnant. What are the risks? Generally, this is a safe procedure. However, problems may occur, including:  Infection.  Bleeding.  Failure of the pacemaker or the lead.  Collapse of a lung or bleeding into a lung.  Blood clot inside a blood vessel with a lead.  Damage to the heart.  Infection inside the heart (endocarditis).  Allergic reactions to medicines. What happens before the procedure? Staying hydrated Follow instructions from your health care provider about hydration, which may include:  Up to 2 hours before the procedure - you may continue to drink clear liquids, such as water, clear fruit juice, black coffee, and plain tea. Eating and drinking restrictions Follow instructions from your health care provider about eating and  drinking, which may include:  8 hours before the procedure - stop eating heavy meals or foods such as meat, fried foods, or fatty foods.  6 hours before the procedure - stop eating light meals or foods, such as toast or cereal.  6 hours before the procedure - stop drinking milk or drinks that contain milk.  2 hours before the procedure - stop drinking clear liquids. Medicines  Ask your health care provider about: ? Changing or stopping your regular medicines. This is especially important if you are taking diabetes medicines or blood thinners. ? Taking medicines such as aspirin and ibuprofen. These medicines can thin your blood. Do not take these medicines before your procedure if your health care provider instructs you not to.  You may be given antibiotic medicine to help prevent infection. General instructions  You will have a heart evaluation. This may include an electrocardiogram (ECG), chest X-ray, and heart imaging (echocardiogram,  or echo) tests.  You will have blood tests.  Do not use any products that contain nicotine or tobacco, such as cigarettes and e-cigarettes. If you need help quitting, ask your health care provider.  Plan to have someone take you home from the hospital or clinic.  If you will be going home right after the procedure, plan to have someone with you for 24 hours.  Ask your health care provider how your surgical site will be marked or identified. What happens during the procedure?  To reduce your risk of infection: ? Your health care team will wash or sanitize their hands. ? Your skin will be washed with soap. ? Hair may be removed from the surgical area.  An IV tube will be inserted into one of your veins.  You will be given one or more of the following: ? A medicine to help you relax (sedative). ? A medicine to numb the area (local anesthetic). ? A medicine to make you fall asleep (general anesthetic).  If you are getting  a transvenous  pacemaker: ? An incision will be made in your upper chest. ? A pocket will be made for the pacemaker. It may be placed under the skin or between layers of muscle. ? The lead will be inserted into a blood vessel that returns to the heart. ? While X-rays are taken by an imaging machine (fluoroscopy), the lead will be advanced through the vein to the inside of your heart. ? The other end of the lead will be tunneled under the skin and attached to the pacemaker.  If you are getting an epicardial pacemaker: ? An incision will be made near your ribs or breastbone (sternum) for the lead. ? The lead will be attached to the outside of your heart. ? Another incision will be made in your chest or upper belly to create a pocket for the pacemaker. ? The free end of the lead will be tunneled under the skin and attached to the pacemaker.  The transvenous or epicardial pacemaker will be tested. Imaging studies may be done to check the lead position.  The incisions will be closed with stitches (sutures), adhesive strips, or skin glue.  Bandages (dressing) will be placed over the incisions. The procedure may vary among health care providers and hospitals. What happens after the procedure?  Your blood pressure, heart rate, breathing rate, and blood oxygen level will be monitored until the medicines you were given have worn off.  You will be given antibiotics and pain medicine.  ECG and chest x-rays will be done.  You will wear a continuous type of ECG (Holter monitor) to check your heart rhythm.  Your health care provider will program the pacemaker.  Do not drive for 24 hours if you received a sedative. This information is not intended to replace advice given to you by your health care provider. Make sure you discuss any questions you have with your health care provider. Document Revised: 05/28/2018 Document Reviewed: 02/20/2016 Elsevier Patient Education  [place].

## 2020-08-22 DIAGNOSIS — Z23 Encounter for immunization: Secondary | ICD-10-CM | POA: Diagnosis not present

## 2020-08-27 ENCOUNTER — Other Ambulatory Visit: Payer: Medicare Other | Admitting: *Deleted

## 2020-08-27 ENCOUNTER — Other Ambulatory Visit: Payer: Self-pay

## 2020-08-27 DIAGNOSIS — I4819 Other persistent atrial fibrillation: Secondary | ICD-10-CM | POA: Diagnosis not present

## 2020-08-27 DIAGNOSIS — Z01812 Encounter for preprocedural laboratory examination: Secondary | ICD-10-CM | POA: Diagnosis not present

## 2020-08-28 DIAGNOSIS — I499 Cardiac arrhythmia, unspecified: Secondary | ICD-10-CM | POA: Diagnosis not present

## 2020-08-28 DIAGNOSIS — M179 Osteoarthritis of knee, unspecified: Secondary | ICD-10-CM | POA: Diagnosis not present

## 2020-08-28 DIAGNOSIS — Z961 Presence of intraocular lens: Secondary | ICD-10-CM | POA: Diagnosis not present

## 2020-08-28 DIAGNOSIS — H04123 Dry eye syndrome of bilateral lacrimal glands: Secondary | ICD-10-CM | POA: Diagnosis not present

## 2020-08-28 DIAGNOSIS — M810 Age-related osteoporosis without current pathological fracture: Secondary | ICD-10-CM | POA: Diagnosis not present

## 2020-08-28 DIAGNOSIS — M797 Fibromyalgia: Secondary | ICD-10-CM | POA: Diagnosis not present

## 2020-08-28 DIAGNOSIS — E785 Hyperlipidemia, unspecified: Secondary | ICD-10-CM | POA: Diagnosis not present

## 2020-08-28 DIAGNOSIS — H35033 Hypertensive retinopathy, bilateral: Secondary | ICD-10-CM | POA: Diagnosis not present

## 2020-08-28 DIAGNOSIS — H40013 Open angle with borderline findings, low risk, bilateral: Secondary | ICD-10-CM | POA: Diagnosis not present

## 2020-08-28 LAB — BASIC METABOLIC PANEL
BUN/Creatinine Ratio: 15 (ref 12–28)
BUN: 13 mg/dL (ref 8–27)
CO2: 21 mmol/L (ref 20–29)
Calcium: 8.9 mg/dL (ref 8.7–10.3)
Chloride: 106 mmol/L (ref 96–106)
Creatinine, Ser: 0.87 mg/dL (ref 0.57–1.00)
GFR calc Af Amer: 71 mL/min/{1.73_m2} (ref >59)
GFR calc non Af Amer: 62 mL/min/{1.73_m2} (ref >59)
Glucose: 110 mg/dL — ABNORMAL HIGH (ref 65–99)
Potassium: 4.3 mmol/L (ref 3.5–5.2)
Sodium: 141 mmol/L (ref 134–144)

## 2020-08-29 ENCOUNTER — Telehealth: Payer: Self-pay | Admitting: *Deleted

## 2020-08-29 NOTE — Telephone Encounter (Signed)
Pt made aware that lab did not draw one of the pre procedure labs that she needed. Pt scheduled to stop by the office on 12/27, prior to her Covid screening, to get needed CBC. Pt agreeable to plan.

## 2020-08-30 DIAGNOSIS — K219 Gastro-esophageal reflux disease without esophagitis: Secondary | ICD-10-CM | POA: Diagnosis not present

## 2020-08-30 DIAGNOSIS — D509 Iron deficiency anemia, unspecified: Secondary | ICD-10-CM | POA: Diagnosis not present

## 2020-08-30 DIAGNOSIS — M810 Age-related osteoporosis without current pathological fracture: Secondary | ICD-10-CM | POA: Diagnosis not present

## 2020-08-30 DIAGNOSIS — I480 Paroxysmal atrial fibrillation: Secondary | ICD-10-CM | POA: Diagnosis not present

## 2020-08-30 DIAGNOSIS — E039 Hypothyroidism, unspecified: Secondary | ICD-10-CM | POA: Diagnosis not present

## 2020-08-30 DIAGNOSIS — H35033 Hypertensive retinopathy, bilateral: Secondary | ICD-10-CM | POA: Diagnosis not present

## 2020-08-30 DIAGNOSIS — E7800 Pure hypercholesterolemia, unspecified: Secondary | ICD-10-CM | POA: Diagnosis not present

## 2020-09-17 ENCOUNTER — Other Ambulatory Visit: Payer: Medicare Other

## 2020-09-17 ENCOUNTER — Other Ambulatory Visit (HOSPITAL_COMMUNITY)
Admission: RE | Admit: 2020-09-17 | Discharge: 2020-09-17 | Disposition: A | Discharge status: Home / Self Care | Payer: Medicare Other | Source: Ambulatory Visit | Attending: Cardiology | Admitting: Cardiology

## 2020-09-17 DIAGNOSIS — Z20822 Contact with and (suspected) exposure to covid-19: Secondary | ICD-10-CM | POA: Diagnosis not present

## 2020-09-17 DIAGNOSIS — Z01812 Encounter for preprocedural laboratory examination: Secondary | ICD-10-CM | POA: Diagnosis not present

## 2020-09-17 LAB — SARS CORONAVIRUS 2 (TAT 6-24 HRS): SARS Coronavirus 2: NEGATIVE

## 2020-09-18 NOTE — Progress Notes (Signed)
Instructed patient on the following items: Arrival time 1230 Nothing to eat or drink after midnight No meds AM of procedure Responsible person to drive you home and stay with you for 24 hrs Wash with special soap night before and morning of procedure If on anti-coagulant drug instructions Eliquis-

## 2020-09-19 ENCOUNTER — Ambulatory Visit (HOSPITAL_COMMUNITY)
Admission: RE | Admit: 2020-09-19 | Discharge: 2020-09-19 | Disposition: A | Discharge status: Home / Self Care | Payer: Medicare Other | Attending: Cardiology | Admitting: Cardiology

## 2020-09-19 ENCOUNTER — Ambulatory Visit (HOSPITAL_COMMUNITY)
Admission: RE | Disposition: A | Discharge status: Home / Self Care | Payer: Self-pay | Source: Home / Self Care | Attending: Cardiology

## 2020-09-19 ENCOUNTER — Other Ambulatory Visit: Payer: Self-pay

## 2020-09-19 ENCOUNTER — Ambulatory Visit (HOSPITAL_COMMUNITY): Payer: Medicare Other

## 2020-09-19 DIAGNOSIS — Z87891 Personal history of nicotine dependence: Secondary | ICD-10-CM | POA: Diagnosis not present

## 2020-09-19 DIAGNOSIS — Z95818 Presence of other cardiac implants and grafts: Secondary | ICD-10-CM

## 2020-09-19 DIAGNOSIS — I495 Sick sinus syndrome: Secondary | ICD-10-CM | POA: Insufficient documentation

## 2020-09-19 DIAGNOSIS — I4819 Other persistent atrial fibrillation: Secondary | ICD-10-CM | POA: Insufficient documentation

## 2020-09-19 DIAGNOSIS — I10 Essential (primary) hypertension: Secondary | ICD-10-CM | POA: Insufficient documentation

## 2020-09-19 DIAGNOSIS — M810 Age-related osteoporosis without current pathological fracture: Secondary | ICD-10-CM | POA: Diagnosis not present

## 2020-09-19 DIAGNOSIS — Z885 Allergy status to narcotic agent status: Secondary | ICD-10-CM | POA: Diagnosis not present

## 2020-09-19 DIAGNOSIS — Z880 Allergy status to penicillin: Secondary | ICD-10-CM | POA: Insufficient documentation

## 2020-09-19 DIAGNOSIS — Z882 Allergy status to sulfonamides status: Secondary | ICD-10-CM | POA: Insufficient documentation

## 2020-09-19 DIAGNOSIS — E785 Hyperlipidemia, unspecified: Secondary | ICD-10-CM | POA: Insufficient documentation

## 2020-09-19 DIAGNOSIS — I517 Cardiomegaly: Secondary | ICD-10-CM | POA: Diagnosis not present

## 2020-09-19 HISTORY — PX: PACEMAKER IMPLANT: EP1218

## 2020-09-19 LAB — CBC
HCT: 41.2 % (ref 36.0–46.0)
Hemoglobin: 13.5 g/dL (ref 12.0–15.0)
MCH: 30.5 pg (ref 26.0–34.0)
MCHC: 32.8 g/dL (ref 30.0–36.0)
MCV: 93 fL (ref 80.0–100.0)
Platelets: 338 10*3/uL (ref 150–400)
RBC: 4.43 MIL/uL (ref 3.87–5.11)
RDW: 13.6 % (ref 11.5–15.5)
WBC: 7.1 10*3/uL (ref 4.0–10.5)
nRBC: 0 % (ref 0.0–0.2)

## 2020-09-19 SURGERY — PACEMAKER IMPLANT
Anesthesia: LOCAL

## 2020-09-19 MED ORDER — SODIUM CHLORIDE 0.9 % IV SOLN
INTRAVENOUS | Status: AC
  Filled 2020-09-19: qty 2

## 2020-09-19 MED ORDER — ACETAMINOPHEN 325 MG PO TABS: 325.0000 mg | ORAL_TABLET | ORAL | Status: DC | PRN

## 2020-09-19 MED ORDER — SODIUM CHLORIDE 0.9 % IV SOLN
80.0000 mg | INTRAVENOUS | Status: AC
  Administered 2020-09-19: 80 mg
  Filled 2020-09-19: qty 2

## 2020-09-19 MED ORDER — FENTANYL CITRATE (PF) 100 MCG/2ML IJ SOLN
INTRAMUSCULAR | Status: DC | PRN
  Administered 2020-09-19: 25 ug via INTRAVENOUS

## 2020-09-19 MED ORDER — MIDAZOLAM HCL 5 MG/5ML IJ SOLN
INTRAMUSCULAR | Status: DC | PRN
  Administered 2020-09-19 (×2): 1 mg via INTRAVENOUS

## 2020-09-19 MED ORDER — LIDOCAINE HCL (PF) 1 % IJ SOLN
INTRAMUSCULAR | Status: DC | PRN
  Administered 2020-09-19: 60 mL

## 2020-09-19 MED ORDER — VANCOMYCIN HCL IN DEXTROSE 1-5 GM/200ML-% IV SOLN
INTRAVENOUS | Status: AC
  Filled 2020-09-19: qty 200

## 2020-09-19 MED ORDER — MIDAZOLAM HCL 5 MG/5ML IJ SOLN
INTRAMUSCULAR | Status: AC
  Filled 2020-09-19: qty 5

## 2020-09-19 MED ORDER — FENTANYL CITRATE (PF) 100 MCG/2ML IJ SOLN
INTRAMUSCULAR | Status: AC
  Filled 2020-09-19: qty 2

## 2020-09-19 MED ORDER — ONDANSETRON HCL 4 MG/2ML IJ SOLN: 4.0000 mg | Freq: Four times a day (QID) | INTRAMUSCULAR | Status: DC | PRN

## 2020-09-19 MED ORDER — HEPARIN (PORCINE) IN NACL 1000-0.9 UT/500ML-% IV SOLN
INTRAVENOUS | Status: AC
  Filled 2020-09-19: qty 500

## 2020-09-19 MED ORDER — LIDOCAINE HCL 1 % IJ SOLN
INTRAMUSCULAR | Status: AC
  Filled 2020-09-19: qty 60

## 2020-09-19 MED ORDER — SODIUM CHLORIDE 0.9 % IV SOLN: INTRAVENOUS | Status: DC

## 2020-09-19 MED ORDER — VANCOMYCIN HCL IN DEXTROSE 1-5 GM/200ML-% IV SOLN
1000.0000 mg | INTRAVENOUS | Status: AC
  Administered 2020-09-19: 1000 mg via INTRAVENOUS

## 2020-09-19 MED ORDER — HEPARIN (PORCINE) IN NACL 1000-0.9 UT/500ML-% IV SOLN
INTRAVENOUS | Status: DC | PRN
  Administered 2020-09-19: 500 mL

## 2020-09-19 MED ORDER — VANCOMYCIN HCL IN DEXTROSE 1-5 GM/200ML-% IV SOLN: 1000.0000 mg | Freq: Two times a day (BID) | INTRAVENOUS | Status: DC

## 2020-09-19 MED ORDER — CHLORHEXIDINE GLUCONATE 4 % EX LIQD
4.0000 "application " | Freq: Once | CUTANEOUS | Status: DC
  Filled 2020-09-19: qty 60

## 2020-09-19 MED ORDER — ACETAMINOPHEN 325 MG PO TABS
ORAL_TABLET | ORAL | Status: AC
  Administered 2020-09-19: 650 mg via ORAL
  Filled 2020-09-19: qty 2

## 2020-09-19 MED ORDER — METOPROLOL SUCCINATE ER 50 MG PO TB24: 100.0000 mg | ORAL_TABLET | Freq: Every evening | ORAL | 3 refills | Status: DC

## 2020-09-19 SURGICAL SUPPLY — 9 items
CABLE SURGICAL S-101-97-12 (CABLE) ×2 IMPLANT
IPG PACE AZUR XT DR MRI W1DR01 (Pacemaker) ×1 IMPLANT
LEAD CAPSURE NOVUS 45CM (Lead) ×2 IMPLANT
LEAD CAPSURE NOVUS 5076-52CM (Lead) ×2 IMPLANT
PACE AZURE XT DR MRI W1DR01 (Pacemaker) ×2 IMPLANT
PAD PRO RADIOLUCENT 2001M-C (PAD) ×2 IMPLANT
SHEATH 7FR PRELUDE SNAP 13 (SHEATH) ×4 IMPLANT
SHEATH PROBE COVER 6X72 (BAG) ×2 IMPLANT
TRAY PACEMAKER INSERTION (PACKS) ×2 IMPLANT

## 2020-09-19 NOTE — H&P (Signed)
Electrophysiology Office Note   Date:  09/19/2020   ID:  NOA Chelsea Hancock, DOB November 19, 1933, MRN [id number]  PCP:  Merlene Laughter, MD  Cardiologist:  Patty Sermons Primary Electrophysiologist:  Regan Lemming, MD    No chief complaint on file.    History of Present Illness: Chelsea Hancock is a 86 y.o. female who presents today for electrophysiology evaluation.     She has a history of atrial fibrillation and flutter. She is status post 2 ablations, November 2016 and again 08/15/2016. She is currently on amiodarone and Toprol-XL, but she has had some bradycardia.   Today, denies symptoms of palpitations, chest pain, shortness of breath, orthopnea, PND, lower extremity edema, claudication, dizziness, presyncope, syncope, bleeding, or neurologic sequela. The patient is tolerating medications without difficulties. Pacemaker implant today.   Past Medical History:  Diagnosis Date  . Atrial fibrillation and flutter (HCC)    a. s/p PVI ablation in 07/2015 with continue PAF  b. s/p repeat ablation on 08/15/16. continued on Flecainide 100mg  BID  . Carotid bruit    Right  . Chronic back pain   . Depression with anxiety 03/29/2013  . Diverticulosis   . Fibromyalgia   . Foot drop, right   . GERD (gastroesophageal reflux disease)   . Hemorrhoids   . HTN (hypertension)   . Hyperlipidemia   . IBS (irritable bowel syndrome)   . Memory loss 02/11/2015  . Onychomycosis 07/03/2013  . Osteoporosis   . Sigmoid colon ulcer 03/23/2012   Past Surgical History:  Procedure Laterality Date  . ABDOMINAL HYSTERECTOMY  1981  . BACK SURGERY  1997   reconstructive lower back surgery  . CARDIOVERSION N/A 07/01/2016   Procedure: CARDIOVERSION;  Surgeon: 08/31/2016, MD;  [facility] END[facility]COPY;  Service: Cardiovascular;  Laterality: N/A;  . CARDIOVERSION N/A 10/22/2017   Procedure: CARDIOVERSION;  Surgeon: 10/24/2017, MD;  Location: [facility] ENDOSCOPY;  Service: Cardiovascular;   Laterality: N/A;  . CARDIOVERSION N/A 07/19/2020   Procedure: CARDIOVERSION;  Surgeon: 07/21/2020, MD;  Location: [facility] ENDOSCOPY;  Service: Cardiovascular;  Laterality: N/A;  . CARPAL TUNNEL RELEASE Left 2012   ulnar nerve release at elbow  . CERVICAL FUSION  2003, 2005, 2013  . COLONOSCOPY    . ELECTROPHYSIOLOGIC STUDY N/A 08/10/2015   Procedure: Afib;  Surgeon: Taleya Whitcher 08/12/2015, MD;  Location: [facility] INVASIVE CV LAB;  Service: Cardiovascular;  Laterality: N/A;  . ELECTROPHYSIOLOGIC STUDY N/A 08/15/2016   Procedure: Atrial Fibrillation Ablation;  Surgeon: Seleen Walter 08/17/2016, MD;  Location: [facility] INVASIVE CV LAB;  Service: Cardiovascular;  Laterality: N/A;  . EYE SURGERY  2015   b/l cataracts removed  . left elbow surgery  06/03/11   cyst removed  . LUMBAR LAMINECTOMY  1960   x 2  . POSTERIOR CERVICAL FUSION/FORAMINOTOMY  08/03/2012   Procedure: POSTERIOR CERVICAL FUSION/FORAMINOTOMY LEVEL 5;  Surgeon: 13/08/2012, MD;  Location: [facility] NEURO ORS;  Service: Neurosurgery;  Laterality: N/A;  Cervical four to Thoracic two Posterior cervical decompression with Facet and Pedicle screw fixation  . TEE WITHOUT CARDIOVERSION N/A 10/22/2017   Procedure: TRANSESOPHAGEAL ECHOCARDIOGRAM (TEE);  Surgeon: 10/24/2017, MD;  Location[facility] END[facility]COPY;  Service: Cardiovascular;  Laterality: N/A;  . TONSILLECTOMY  1943  . TOTAL HIP ARTHROPLASTY Left 2012  . TOTAL KNEE ARTHROPLASTY Right 11/20/2014   Procedure: RIGHT TOTAL KNEE ARTHROPLASTY;  Surgeon: 11/22/2014, MD;  Location: [facility] ORS;  Service: Orthopedics;  Laterality: Right;  . veins stripped  1970  .  WRIST SURGERY Right 1990's   cyst removed     Current Facility-Administered Medications  Medication Dose Route Frequency Provider Last Rate Last Admin  . 0.9 %  sodium chloride infusion   Intravenous Continuous Constance Haw, MD 50 mL/hr at 09/19/20 1308 New Bag at 09/19/20 1308  . chlorhexidine (HIBICLENS) 4 % liquid 4 application  4  application Topical Once Meryle Pugmire Hassell Done, MD      . gentamicin (GARAMYCIN) 80 mg in sodium chloride 0.9 % 500 mL irrigation  80 mg Irrigation On Call Reta Norgren Hassell Done, MD      . vancomycin (VANCOCIN) IVPB 1000 mg/200 mL premix  1,000 mg Intravenous On Call Mikena Masoner, Ocie Doyne, MD        Allergies:   Cefuroxime axetil, Macrodantin, Alendronate sodium, Codeine, Demerol [meperidine], Other, Penicillins, and Sulfonamide derivatives   Social History:  The patient  reports that she has quit smoking. Her smoking use included cigarettes. She has a 1.00 pack-year smoking history. She has never used smokeless tobacco. She reports current alcohol use. She reports that she does not use drugs.   Family History:  The patient's family history includes Anxiety disorder in her mother; Bipolar disorder in her mother; Colon cancer in her father; HIV/AIDS in her son; Healthy in her daughter; Heart disease in her maternal grandmother; Mental illness in her mother; Stomach cancer in her maternal grandfather, mother, and paternal grandmother; Stroke in her father; Throat cancer in her maternal uncle.   ROS:  Please see the history of present illness.   Otherwise, review of systems is positive for none.   All other systems are reviewed and negative.   PHYSICAL EXAM: VS:  BP 118/85   Pulse (!) 106   Temp 98.6 F (37 C) (Oral)   Resp 15   Ht 5\' 5"  (1.651 m)   Wt 71 kg   SpO2 93%   BMI 26.05 kg/m  , BMI Body mass index is 26.05 kg/m. GEN: Well nourished, well developed, in no acute distress  HEENT: normal  Neck: no JVD, carotid bruits, or masses Cardiac: RRR; no murmurs, rubs, or gallops,no edema  Respiratory:  clear to auscultation bilaterally, normal work of breathing GI: soft, nontender, nondistended, + BS MS: no deformity or atrophy  Skin: warm and dry Neuro:  Strength and sensation are intact Psych: euthymic mood, full affect  Recent Labs: 07/04/2020: ALT 27; Hemoglobin 13.5; Platelets  251 08/27/2020: BUN 13; Creatinine, Ser 0.87; Potassium 4.3; Sodium 141    Lipid Panel     Component Value Date/Time   CHOL 161 04/16/2019 0352   TRIG 120 04/16/2019 0352   TRIG 133 06/30/2006 1110   HDL 53 04/16/2019 0352   CHOLHDL 3.0 04/16/2019 0352   VLDL 24 04/16/2019 0352   LDLCALC 84 04/16/2019 0352   LDLDIRECT 93.3 08/04/2008 0915     Wt Readings from Last 3 Encounters:  09/19/20 71 kg  08/09/20 71 kg  08/07/20 70.7 kg      Other studies Reviewed: Additional studies/ records that were reviewed today include: TEE 10/22/17  Review of the above records today demonstrates:  - Left ventricle: There was mild concentric hypertrophy. Systolic   function was mildly reduced. The estimated ejection fraction was   in the range of 45% to 50%. Diffuse hypokinesis. No evidence of   thrombus. - Aortic valve: No evidence of vegetation. - Mitral valve: There was mild regurgitation. - Left atrium: No evidence of thrombus in the atrial cavity or  appendage. No evidence of thrombus in the atrial cavity or   appendage. - Right atrium: No evidence of thrombus in the atrial cavity or   appendage. 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 This was visualized in 2D and 3D modes. - Tricuspid valve: There was mild regurgitation. - Pulmonic valve: There was trivial regurgitation.   ASSESSMENT AND PLAN:  1. Persistent atrial fibrillation: Status post ablation most recently 08/15/2016. Currently on amiodarone (monitoring for high risk medication) and Eliquis. She is unfortunately gone back into atrial fibrillation. She was started on Toprol-XL 50 mg. She also had cardioversion.  Unfortunately she was quite bradycardic after cardioversion and required holding her  metoprolol.  I do feel that this is likely tachybradycardia syndrome.  We Maryna Yeagle plan for pacemaker implant.  2. Hypertension: Currently well controlled  3. Hyperlipidemia: Continue atorvastatin and Zetia  4.  Tachybradycardia syndrome: TAUSHA MILHOAN has presented today for surgery, with the diagnosis of tachy/brady.  The various methods of treatment have been discussed with the patient and family. After consideration of risks, benefits and other options for treatment, the patient has consented to  Procedure(s): Pacemaker implant as a surgical intervention .  Risks include but not limited to bleeding, infection, pneumothorax, perforation, tamponade, vascular damage, renal failure, MI, stroke, death, and lead dislodgement . The patient's history has been reviewed, patient examined, no change in status, stable for surgery.  I have reviewed the patient's chart and labs.  Questions were answered to the patient's satisfaction.    Chelby Salata Elberta Fortis, MD 09/19/2020 1:16 PM

## 2020-09-19 NOTE — Progress Notes (Signed)
Dr Camnitz notified of cxr results and ok to d/c home 

## 2020-09-19 NOTE — Discharge Instructions (Signed)
After Your Pacemaker    You have a Medtronic Pacemaker  ACTIVITY  Do not lift your arm above shoulder height for 1 week after your procedure. After 7 days, you may progress as below.   You should remove your sling 24 hours after your procedure, unless otherwise instructed by your provider.     Wednesday September 26, 2020  Thursday September 27, 2020 Friday September 28, 2020 Saturday September 29, 2020    Do not lift, push, pull, or carry anything over 10 pounds with the affected arm until 6 weeks (Wednesday October 31, 2020 ) after your procedure.    Do NOT DRIVE until you have been seen for your wound check, or as long as instructed by your healthcare provider.    Ask your healthcare provider when you can go back to work   INCISION/Dressing  If you are on a blood thinner such as  Eliquis, should be resumed In the morning.   If large square, outer bandage is left in place, this can be removed after 24 hours from your procedure. Do not remove steri-strips or glue as below.    Monitor your Pacemaker site for redness, swelling, and drainage. Call the device clinic at [phone] if you experience these symptoms or fever/chills.   If your incision is sealed with Steri-strips or staples, you may shower 10 days after your procedure or when told by your provider. Do not remove the steri-strips or let the shower hit directly on your site. You may wash around your site with soap and water.     Avoid lotions, ointments, or perfumes over your incision until it is well-healed.   You may use a hot tub or a pool AFTER your wound check appointment if the incision is completely closed.   PAcemaker Alerts:  Some alerts are vibratory and others beep. These are NOT emergencies. Please call our office to let us know. If this occurs at night or on weekends, it can wait until the next business day. Send a remote transmission.   If your device is capable of reading fluid status (for heart failure),  you will be offered monthly monitoring to review this with you.   DEVICE MANAGEMENT  Remote monitoring is used to monitor your pacemaker from home. This monitoring is scheduled every 91 days by our office. It allows [place] to keep an eye on the functioning of your device to ensure it is working properly. You will routinely see your Electrophysiologist annually (more often if necessary).    You should receive your ID card for your new device in 4-8 weeks. Keep this card with you at all times once received. Consider wearing a medical alert bracelet or necklace.   Your Pacemaker may be MRI compatible. This will be discussed at your next office visit/wound check.  You should avoid contact with strong electric or magnetic fields.    Do not use amateur (ham) radio equipment or electric (arc) welding torches. MP3 player headphones with magnets should not be used. Some devices are safe to use if held at least 12 inches (30 cm) from your Pacemaker. These include power tools, lawn mowers, and speakers. If you are unsure if something is safe to use, ask your health care provider.   When using your cell phone, hold it to the ear that is on the opposite side from the Pacemaker. Do not leave your cell phone in a pocket over the Pacemaker.   You may safely use electric blankets, heating pads, computers,  and microwave ovens.  Call the office right away if:  You have chest pain.  You feel more short of breath than you have felt before.  You feel more light-headed than you have felt before.  Your incision starts to open up.  This information is not intended to replace advice given to you by your health care provider. Make sure you discuss any questions you have with your health care provider.

## 2020-09-19 NOTE — Progress Notes (Signed)
Report received from Jo, RN

## 2020-09-20 ENCOUNTER — Ambulatory Visit: Payer: Medicare Other | Admitting: Cardiology

## 2020-09-20 ENCOUNTER — Telehealth: Payer: Self-pay | Admitting: Emergency Medicine

## 2020-09-20 ENCOUNTER — Encounter (HOSPITAL_COMMUNITY): Payer: Self-pay | Admitting: Cardiology

## 2020-09-20 MED FILL — Lidocaine HCl Local Inj 1%: INTRAMUSCULAR | Qty: 60 | Status: AC

## 2020-09-20 NOTE — Telephone Encounter (Signed)
LMOM with DC # and hours. Follow-up for Medtronic pacemaker implant 09/19/20. 10/03/19 Wound Check  And AF Clinic appointment. 09/20/20 Remote transmission received.

## 2020-09-20 NOTE — Telephone Encounter (Signed)
DaughterNeysa Bonito) called device clinic. She is on DPR. Patient has memory issues and balance issues. Daughter will remove occlusive dressing this afternoon. Education done on lifting restrictions, s/sx of infection or bleeding,  and arm movement. Daughter reports she is concerned that her mother will use her LUE too much and place to much pressure on the arm when getting out of bed and while using her walker. Will call the device clinic if she has any questions or concerns and # provided.

## 2020-09-28 ENCOUNTER — Other Ambulatory Visit (HOSPITAL_COMMUNITY): Payer: Self-pay | Admitting: *Deleted

## 2020-09-28 DIAGNOSIS — K219 Gastro-esophageal reflux disease without esophagitis: Secondary | ICD-10-CM | POA: Diagnosis not present

## 2020-09-28 DIAGNOSIS — R0602 Shortness of breath: Secondary | ICD-10-CM | POA: Diagnosis not present

## 2020-09-28 DIAGNOSIS — R0902 Hypoxemia: Secondary | ICD-10-CM | POA: Diagnosis not present

## 2020-09-28 DIAGNOSIS — I480 Paroxysmal atrial fibrillation: Secondary | ICD-10-CM | POA: Diagnosis not present

## 2020-09-28 DIAGNOSIS — H35033 Hypertensive retinopathy, bilateral: Secondary | ICD-10-CM | POA: Diagnosis not present

## 2020-09-28 DIAGNOSIS — R069 Unspecified abnormalities of breathing: Secondary | ICD-10-CM | POA: Diagnosis not present

## 2020-09-28 DIAGNOSIS — E039 Hypothyroidism, unspecified: Secondary | ICD-10-CM | POA: Diagnosis not present

## 2020-09-28 DIAGNOSIS — R000 Tachycardia, unspecified: Secondary | ICD-10-CM | POA: Diagnosis not present

## 2020-09-28 DIAGNOSIS — D509 Iron deficiency anemia, unspecified: Secondary | ICD-10-CM | POA: Diagnosis not present

## 2020-09-28 DIAGNOSIS — E7800 Pure hypercholesterolemia, unspecified: Secondary | ICD-10-CM | POA: Diagnosis not present

## 2020-09-28 DIAGNOSIS — R52 Pain, unspecified: Secondary | ICD-10-CM | POA: Diagnosis not present

## 2020-09-28 DIAGNOSIS — M810 Age-related osteoporosis without current pathological fracture: Secondary | ICD-10-CM | POA: Diagnosis not present

## 2020-09-28 MED ORDER — METOPROLOL SUCCINATE ER 50 MG PO TB24: 100.0000 mg | ORAL_TABLET | Freq: Every evening | ORAL | 3 refills | Status: DC

## 2020-09-29 DIAGNOSIS — J9601 Acute respiratory failure with hypoxia: Secondary | ICD-10-CM | POA: Diagnosis not present

## 2020-09-29 DIAGNOSIS — R079 Chest pain, unspecified: Secondary | ICD-10-CM | POA: Diagnosis not present

## 2020-09-29 DIAGNOSIS — I482 Chronic atrial fibrillation, unspecified: Secondary | ICD-10-CM | POA: Diagnosis not present

## 2020-09-29 DIAGNOSIS — R0602 Shortness of breath: Secondary | ICD-10-CM | POA: Diagnosis not present

## 2020-09-29 DIAGNOSIS — R0789 Other chest pain: Secondary | ICD-10-CM | POA: Diagnosis not present

## 2020-09-29 DIAGNOSIS — I313 Pericardial effusion (noninflammatory): Secondary | ICD-10-CM | POA: Diagnosis present

## 2020-09-29 DIAGNOSIS — I4891 Unspecified atrial fibrillation: Secondary | ICD-10-CM | POA: Diagnosis not present

## 2020-09-29 DIAGNOSIS — Z880 Allergy status to penicillin: Secondary | ICD-10-CM | POA: Diagnosis not present

## 2020-09-29 DIAGNOSIS — I429 Cardiomyopathy, unspecified: Secondary | ICD-10-CM | POA: Diagnosis not present

## 2020-09-29 DIAGNOSIS — R0781 Pleurodynia: Secondary | ICD-10-CM | POA: Diagnosis not present

## 2020-09-29 DIAGNOSIS — K219 Gastro-esophageal reflux disease without esophagitis: Secondary | ICD-10-CM | POA: Diagnosis present

## 2020-09-29 DIAGNOSIS — E039 Hypothyroidism, unspecified: Secondary | ICD-10-CM | POA: Diagnosis present

## 2020-09-29 DIAGNOSIS — I319 Disease of pericardium, unspecified: Secondary | ICD-10-CM | POA: Diagnosis not present

## 2020-09-29 DIAGNOSIS — J159 Unspecified bacterial pneumonia: Secondary | ICD-10-CM | POA: Diagnosis not present

## 2020-09-29 DIAGNOSIS — Z882 Allergy status to sulfonamides status: Secondary | ICD-10-CM | POA: Diagnosis not present

## 2020-09-29 DIAGNOSIS — Z883 Allergy status to other anti-infective agents status: Secondary | ICD-10-CM | POA: Diagnosis not present

## 2020-09-29 DIAGNOSIS — I071 Rheumatic tricuspid insufficiency: Secondary | ICD-10-CM | POA: Diagnosis present

## 2020-09-29 DIAGNOSIS — I10 Essential (primary) hypertension: Secondary | ICD-10-CM | POA: Diagnosis not present

## 2020-09-29 DIAGNOSIS — E785 Hyperlipidemia, unspecified: Secondary | ICD-10-CM | POA: Diagnosis present

## 2020-09-29 DIAGNOSIS — R000 Tachycardia, unspecified: Secondary | ICD-10-CM | POA: Diagnosis not present

## 2020-09-29 DIAGNOSIS — I517 Cardiomegaly: Secondary | ICD-10-CM | POA: Diagnosis not present

## 2020-09-29 DIAGNOSIS — I081 Rheumatic disorders of both mitral and tricuspid valves: Secondary | ICD-10-CM | POA: Diagnosis not present

## 2020-09-29 DIAGNOSIS — Z7901 Long term (current) use of anticoagulants: Secondary | ICD-10-CM | POA: Diagnosis not present

## 2020-09-29 DIAGNOSIS — Z20822 Contact with and (suspected) exposure to covid-19: Secondary | ICD-10-CM | POA: Diagnosis not present

## 2020-09-29 DIAGNOSIS — R0902 Hypoxemia: Secondary | ICD-10-CM | POA: Diagnosis not present

## 2020-09-29 DIAGNOSIS — F32A Depression, unspecified: Secondary | ICD-10-CM | POA: Diagnosis present

## 2020-09-29 DIAGNOSIS — Z881 Allergy status to other antibiotic agents status: Secondary | ICD-10-CM | POA: Diagnosis not present

## 2020-09-29 DIAGNOSIS — J189 Pneumonia, unspecified organism: Secondary | ICD-10-CM | POA: Diagnosis not present

## 2020-09-29 DIAGNOSIS — I110 Hypertensive heart disease with heart failure: Secondary | ICD-10-CM | POA: Diagnosis present

## 2020-09-29 DIAGNOSIS — R9431 Abnormal electrocardiogram [ECG] [EKG]: Secondary | ICD-10-CM | POA: Diagnosis not present

## 2020-09-29 DIAGNOSIS — I4819 Other persistent atrial fibrillation: Secondary | ICD-10-CM | POA: Diagnosis not present

## 2020-09-29 DIAGNOSIS — R4189 Other symptoms and signs involving cognitive functions and awareness: Secondary | ICD-10-CM | POA: Diagnosis present

## 2020-09-29 DIAGNOSIS — I5022 Chronic systolic (congestive) heart failure: Secondary | ICD-10-CM | POA: Diagnosis not present

## 2020-09-29 DIAGNOSIS — Z950 Presence of cardiac pacemaker: Secondary | ICD-10-CM | POA: Diagnosis not present

## 2020-10-01 ENCOUNTER — Telehealth: Payer: Self-pay | Admitting: Cardiology

## 2020-10-01 NOTE — Telephone Encounter (Signed)
Dtr calls in to report pt is currently at hospital in [place]. States that around [place] Saturday morning the pt c/o CP and SOB, stated she "had difficulty breathing".  Reports that EMS said pt O2 sats were low, but she is not sure what the saturation was. Dtr says that hospital ruled out blood clots and that MRI showed possible PNA in one of her lobes, but she doesn't know which one. She also reports that they performed an "ultrasound yesterday", she doesn't know what kind of ultrasound, but that it revealed "fluid around her heart". Pt is not on oxygen anymore as of this morning. Advised to see what [place] they did?  Advised they should reach out to Dr. Curt Bears to further discuss. Advised I would follow up this week to see what happened. dtr agreeable to plan.

## 2020-10-01 NOTE — Telephone Encounter (Signed)
Patient's daughter calling to speak with Dr. Curt Bears nurse. She states her mother has been admitted to the hospital in [place] and would like to talk with her about this.

## 2020-10-02 ENCOUNTER — Ambulatory Visit: Payer: Medicare Other

## 2020-10-02 ENCOUNTER — Ambulatory Visit (HOSPITAL_COMMUNITY): Payer: Medicare Other | Admitting: Physician Assistant

## 2020-10-02 NOTE — Telephone Encounter (Signed)
Followed up w/ dtr.   Informed that we have not heard from anyone at hospital. Dtr reports that Allie Dimmer, PA will be reaching out to [place] today to discuss pt status w/ Dr. Curt Bears. Dtr reports that they interrogated pt's PPM yesterday after 5pm. She also reports that they are diagnosing pt w/ pericarditis and have started her on Ibuprofen & Colchicine. Dr Curt Bears made aware. She appreciates my follow up.

## 2020-10-02 NOTE — Telephone Encounter (Signed)
[place] speaking with Allie Dimmer, PA at hospital now. Discussed pt case. PA informed Dr. Curt Bears they are planning on restarting DOAC. They discussed pt need for possible sleep study.  Will forward to [facility] staff to address with her at her 1/19 OV, per Dr. Curt Bears.

## 2020-10-02 NOTE — Telephone Encounter (Signed)
  Allie Dimmer, PA from the hospital is calling to speak to Dr Curt Bears in regards to this patient. Please return her call at number provided in staff message sent to [place], [place]

## 2020-10-02 NOTE — Telephone Encounter (Signed)
Daughter called back to provide Sherri with more information.  Daughter wanted to let Sherri know that the patient has also been taken off her Eliquis.

## 2020-10-09 NOTE — Progress Notes (Signed)
Primary Care Physician: Lajean Manes, MD Referring Physician: Dr. Margaretha Sheffield Chelsea Hancock is a 87 y.o. female with a h/o afib, s/p 2 ablations and has failed flecainide in the past and is on amiodarone 200 mg daily. She saw Dr. Curt Bears in October 2021 and was in Afib with RVR. He added Toprol  XL 50 mg daily and scheduled her for cardioversion. CV MD stopped Toprol at time of cardioversion for HR's in  the low 50's in  SR. She is now in afib in the clinic with RVR. I discussed with Dr. Curt Bears, appearing to have tachy/brady and the possible need for PPM .  On follow up today, patient is s/p PPM implant 09/19/21. She was hospitalized at [facility] 1/8-1/11/22 with pericarditis. She was treated with colchicine and ibuprofen. Unfortunately, her Eliquis was held for two days, resumed at discharge. She continues in afib with symptoms of fatigue and dyspnea with exertion. She also reports significant snoring and daytime somnolence.   Today, she denies symptoms of palpitations, chest pain, orthopnea, PND, lower extremity edema, dizziness, presyncope, syncope, or neurologic sequela. The patient is tolerating medications without difficulties and is otherwise without complaint today.   Past Medical History:  Diagnosis Date  . Atrial fibrillation and flutter ([place])    a. s/p PVI ablation in 07/2015 with continue PAF  b. s/p repeat ablation on 08/15/16. continued on Flecainide 100mg  BID  . Carotid bruit    Right  . Chronic back pain   . Depression with anxiety 03/29/2013  . Diverticulosis   . Fibromyalgia   . Foot drop, right   . GERD (gastroesophageal reflux disease)   . Hemorrhoids   . HTN (hypertension)   . Hyperlipidemia   . IBS (irritable bowel syndrome)   . Memory loss 02/11/2015  . Onychomycosis 07/03/2013  . Osteoporosis   . Sigmoid colon ulcer 03/23/2012   Past Surgical History:  Procedure Laterality Date  . ABDOMINAL HYSTERECTOMY  1981  . BACK SURGERY   1997   reconstructive lower back surgery  . CARDIOVERSION N/A 07/01/2016   Procedure: CARDIOVERSION;  Surgeon: Thayer Headings, MD;  Location: [facility] ENDOSCOPY;  Service: Cardiovascular;  Laterality: N/A;  . CARDIOVERSION N/A 10/22/2017   Procedure: CARDIOVERSION;  Surgeon: Pixie Casino, MD;  Location: [facility] ENDOSCOPY;  Service: Cardiovascular;  Laterality: N/A;  . CARDIOVERSION N/A 07/19/2020   Procedure: CARDIOVERSION;  Surgeon: Freada Bergeron, MD;  Location: [facility] ENDOSCOPY;  Service: Cardiovascular;  Laterality: N/A;  . CARPAL TUNNEL RELEASE Left 2012   ulnar nerve release at elbow  . CERVICAL FUSION  2003, 2005, 2013  . COLONOSCOPY    . ELECTROPHYSIOLOGIC STUDY N/A 08/10/2015   Procedure: Afib;  Surgeon: Will Meredith Leeds, MD;  Location: [place] CV LAB;  Service: Cardiovascular;  Laterality: N/A;  . ELECTROPHYSIOLOGIC STUDY N/A 08/15/2016   Procedure: Atrial Fibrillation Ablation;  Surgeon: Will Meredith Leeds, MD;  Location: [place] CV LAB;  Service: Cardiovascular;  Laterality: N/A;  . EYE SURGERY  2015   b/l cataracts removed  . left elbow surgery  06/03/11   cyst removed  . LUMBAR LAMINECTOMY  1960   x 2  . PACEMAKER IMPLANT N/A 09/19/2020   Procedure: PACEMAKER IMPLANT;  Surgeon: Constance Haw, MD;  Location: [place] CV LAB;  Service: Cardiovascular;  Laterality: N/A;  . POSTERIOR CERVICAL FUSION/FORAMINOTOMY  08/03/2012   Procedure: POSTERIOR CERVICAL FUSION/FORAMINOTOMY LEVEL 5;  Surgeon: [name], MD;  Location: [place]  ORS;  Service: Neurosurgery;  Laterality: N/A;  Cervical four to Thoracic two Posterior cervical decompression with Facet and Pedicle screw fixation  . TEE WITHOUT CARDIOVERSION N/A 10/22/2017   Procedure: TRANSESOPHAGEAL ECHOCARDIOGRAM (TEE);  Surgeon: Pixie Casino, MD;  Location: [facility] ENDOSCOPY;  Service: Cardiovascular;  Laterality: N/A;  . TONSILLECTOMY  1943  . TOTAL HIP ARTHROPLASTY Left 2012  . TOTAL KNEE ARTHROPLASTY Right  11/20/2014   Procedure: RIGHT TOTAL KNEE ARTHROPLASTY;  Surgeon: [name], MD;  Location: [facility] ORS;  Service: Orthopedics;  Laterality: Right;  . veins stripped  1970  . WRIST SURGERY Right 1990's   cyst removed    Current Outpatient Medications  Medication Sig Dispense Refill  . amiodarone (PACERONE) 200 MG tablet TAKE ONE TABLET BY MOUTH EVERY MORNING (Patient taking differently: Take 200 mg by mouth daily.) 90 tablet 3  . atorvastatin (LIPITOR) 10 MG tablet Take 10 mg by mouth daily.    Marland Kitchen CALCIUM+D3 600-20 MG-MCG TABS Take 1 tablet by mouth at bedtime.    . colchicine 0.6 MG tablet Take by mouth.    Arne Cleveland 5 MG TABS tablet Take 1 tablet by mouth twice daily. (Patient taking differently: Take 5 mg by mouth 2 (two) times daily.) 60 tablet 3  . esomeprazole (NEXIUM) 40 MG capsule TAKE ONE CAPSULE BY MOUTH EVERY DAY AT 12 NOON (Patient taking differently: Take 40 mg by mouth daily before breakfast.) 90 capsule 2  . eszopiclone (LUNESTA) 2 MG TABS tablet Take 1 tablet (2 mg total) by mouth at bedtime as needed for sleep. Take immediately before bedtime (Patient taking differently: Take 2 mg by mouth at bedtime. Take immediately before bedtime) 30 tablet 2  . ezetimibe (ZETIA) 10 MG tablet TAKE 10 MG BY MOUTH AT BEDTIME (Patient taking differently: Take 10 mg by mouth at bedtime.) 30 tablet 6  . ferrous sulfate 325 (65 FE) MG tablet Take 1 tablet by mouth every other day.     . gabapentin (NEURONTIN) 600 MG tablet TAKE 1 TABLET(600 MG) BY MOUTH TWICE DAILY (Patient taking differently: Take 600 mg by mouth 2 (two) times daily.) 180 tablet 0  . ibuprofen (ADVIL) 600 MG tablet Take by mouth.    . levothyroxine (SYNTHROID) 25 MCG tablet Take 25 mcg by mouth daily before breakfast.    . Melatonin 10 MG TABS Take 10 mg by mouth at bedtime.    . metoprolol succinate (TOPROL-XL) 50 MG 24 hr tablet Take 2 tablets (100 mg total) by mouth at bedtime. Take with or immediately following a meal. (Patient  taking differently: Take 50 mg by mouth at bedtime. Take with or immediately following a meal.) 60 tablet 3  . Multiple Vitamin (MULTIVITAMIN WITH MINERALS) TABS tablet Take 1 tablet by mouth daily. Centrum Silver for Women    . nortriptyline (PAMELOR) 50 MG capsule Take 100 mg by mouth at bedtime.    Vladimir Faster Glycol-Propyl Glycol (SYSTANE OP) Place 1 drop into both eyes 4 (four) times daily as needed (dry/irritated eyes.).     Marland Kitchen acetaminophen (TYLENOL) 500 MG tablet Take 500-1,000 mg by mouth every 6 (six) hours as needed (pain.).    Marland Kitchen clobetasol (TEMOVATE) 0.05 % GEL Apply 1 application topically 2 (two) times daily as needed (skin irritation.). APPLY TO THE AFFECTED AREA OF SKIN ON ARMS TWICE DAILY  0  . denosumab (PROLIA) 60 MG/ML SOSY injection Inject 60 mg into the skin every 6 (six) months.     No current facility-administered medications  for this encounter.    Allergies  Allergen Reactions  . Cefuroxime Axetil Anaphylaxis  . Macrodantin Anaphylaxis  . Alendronate Sodium Other (See Comments)    Severe chest pain similar to heart attack   . Codeine Nausea And Vomiting  . Demerol [Meperidine] Nausea And Vomiting and Other (See Comments)    hallucinations  . Other Other (See Comments)    Hismanal and Maprobamate portobello mushrooms - diarrhea  . Penicillins Swelling and Other (See Comments)    Has patient had a PCN reaction causing immediate rash, facial/tongue/throat swelling, SOB or lightheadedness with hypotension: Yes Has patient had a PCN reaction causing severe rash involving mucus membranes or skin necrosis: No Has patient had a PCN reaction that required hospitalization: Yes Has patient had a PCN reaction occurring within the last 10 years: Yes If all of the above answers are "NO", then may proceed with Cephalosporin use.   . Sulfonamide Derivatives Hives, Itching and Swelling    Social History   Socioeconomic History  . Marital status: Divorced    Spouse name:  Not on file  . Number of children: Not on file  . Years of education: Not on file  . Highest education level: Not on file  Occupational History  . Not on file  Tobacco Use  . Smoking status: Former Smoker    Packs/day: 0.25    Years: 4.00    Pack years: 1.00    Types: Cigarettes  . Smokeless tobacco: Never Used  . Tobacco comment: quit 20+yrs ago  Vaping Use  . Vaping Use: Never used  Substance and Sexual Activity  . Alcohol use: Yes    Alcohol/week: 0.0 standard drinks    Comment: WINE WITH DINNER. 2 GLASSES RED WINE  . Drug use: No  . Sexual activity: Yes    Partners: Male  Other Topics Concern  . Not on file  Social History Narrative  . Not on file   Social Determinants of Health   Financial Resource Strain: Not on file  Food Insecurity: Not on file  Transportation Needs: Not on file  Physical Activity: Not on file  Stress: Not on file  Social Connections: Not on file  Intimate Partner Violence: Not on file    Family History  Problem Relation Age of Onset  . Bipolar disorder Mother   . Stomach cancer Mother   . Mental illness Mother   . Anxiety disorder Mother   . Stroke Father   . Colon cancer Father   . HIV/AIDS Son   . Stomach cancer Maternal Grandfather   . Stomach cancer Paternal Grandmother   . Healthy Daughter   . Heart disease Maternal Grandmother   . Throat cancer Maternal Uncle        larynx  . Anesthesia problems Neg Hx   . Diabetes Neg Hx     ROS- All systems are reviewed and negative except as per the HPI above  Physical Exam: Vitals:   10/10/20 1123  BP: 140/80  Pulse: 93  Weight: 71.7 kg   Wt Readings from Last 3 Encounters:  10/10/20 71.7 kg  09/19/20 71 kg  08/09/20 71 kg    Labs: Lab Results  Component Value Date   NA 141 08/27/2020   K 4.3 08/27/2020   CL 106 08/27/2020   CO2 21 08/27/2020   GLUCOSE 110 (H) 08/27/2020   BUN 13 08/27/2020   CREATININE 0.87 08/27/2020   CALCIUM 8.9 08/27/2020   PHOS 2.4 08/03/2014    MG  1.9 12/25/2015   Lab Results  Component Value Date   INR 0.98 07/07/2015   Lab Results  Component Value Date   CHOL 161 04/16/2019   HDL 53 04/16/2019   LDLCALC 84 04/16/2019   TRIG 120 04/16/2019    GEN- The patient is well appearing elderly female, alert and oriented x 3 today.   HEENT-head normocephalic, atraumatic, sclera clear, conjunctiva pink, hearing intact, trachea midline. Lungs- Clear to ausculation bilaterally, normal work of breathing Heart- irregular rate and rhythm, no murmurs, rubs or gallops  GI- soft, NT, ND, + BS Extremities- no clubbing, cyanosis, or edema MS- no significant deformity or atrophy Skin- no rash or lesion Psych- euthymic mood, full affect Neuro- strength and sensation are intact   EKG- afib, artifact V5, QRS 106, QTc 469   Assessment and Plan: 1. Persistent afib  Patient remains in rate controlled afib.  Will plan for repeat DCCV after 3 weeks of uninterrupted anticoagulation (on or after 2/1) Check bmet/CBC Will check echo per Dr Curt Bears rec. Continue amiodarone 200 mg daily  Continue Toprol 50 mg daily Continue Eliquis 5 mg BID  2. CHA2DS2VASc score of 5 Continue eliquis 5 mg bid   3. Tachybradycardia Syndrome S/p PPM, followed by Dr Curt Bears and the device clinic. Wound check today.  4. Snoring/daytime somnolence  The importance of adequate treatment of sleep apnea was discussed today in order to improve our ability to maintain sinus rhythm long term. Will refer for sleep study.   5. Pericarditis On colchicine and ibuprofen No chest pain today.   Follow up in the AF clinic one week post DCCV.    [facility] [address] [place],  [id number] [phone]

## 2020-10-09 NOTE — H&P (View-Only) (Signed)
Primary Care Physician: Lajean Manes, MD Referring Physician: Dr. Margaretha Sheffield Chelsea Hancock is a 87 y.o. female with a h/o afib, s/p 2 ablations and has failed flecainide in the past and is on amiodarone 200 mg daily. She saw Dr. Curt Bears in October 2021 and was in Afib with RVR. He added Toprol  XL 50 mg daily and scheduled her for cardioversion. CV MD stopped Toprol at time of cardioversion for HR's in  the low 50's in  SR. She is now in afib in the clinic with RVR. I discussed with Dr. Curt Bears, appearing to have tachy/brady and the possible need for PPM .  On follow up today, patient is s/p PPM implant 09/19/21. She was hospitalized at [facility] 1/8-1/11/22 with pericarditis. She was treated with colchicine and ibuprofen. Unfortunately, her Eliquis was held for two days, resumed at discharge. She continues in afib with symptoms of fatigue and dyspnea with exertion. She also reports significant snoring and daytime somnolence.   Today, she denies symptoms of palpitations, chest pain, orthopnea, PND, lower extremity edema, dizziness, presyncope, syncope, or neurologic sequela. The patient is tolerating medications without difficulties and is otherwise without complaint today.   Past Medical History:  Diagnosis Date  . Atrial fibrillation and flutter ([place])    a. s/p PVI ablation in 07/2015 with continue PAF  b. s/p repeat ablation on 08/15/16. continued on Flecainide 100mg  BID  . Carotid bruit    Right  . Chronic back pain   . Depression with anxiety 03/29/2013  . Diverticulosis   . Fibromyalgia   . Foot drop, right   . GERD (gastroesophageal reflux disease)   . Hemorrhoids   . HTN (hypertension)   . Hyperlipidemia   . IBS (irritable bowel syndrome)   . Memory loss 02/11/2015  . Onychomycosis 07/03/2013  . Osteoporosis   . Sigmoid colon ulcer 03/23/2012   Past Surgical History:  Procedure Laterality Date  . ABDOMINAL HYSTERECTOMY  1981  . BACK SURGERY   1997   reconstructive lower back surgery  . CARDIOVERSION N/A 07/01/2016   Procedure: CARDIOVERSION;  Surgeon: Thayer Headings, MD;  Location: [facility] ENDOSCOPY;  Service: Cardiovascular;  Laterality: N/A;  . CARDIOVERSION N/A 10/22/2017   Procedure: CARDIOVERSION;  Surgeon: Pixie Casino, MD;  Location: [facility] ENDOSCOPY;  Service: Cardiovascular;  Laterality: N/A;  . CARDIOVERSION N/A 07/19/2020   Procedure: CARDIOVERSION;  Surgeon: Freada Bergeron, MD;  Location: [facility] ENDOSCOPY;  Service: Cardiovascular;  Laterality: N/A;  . CARPAL TUNNEL RELEASE Left 2012   ulnar nerve release at elbow  . CERVICAL FUSION  2003, 2005, 2013  . COLONOSCOPY    . ELECTROPHYSIOLOGIC STUDY N/A 08/10/2015   Procedure: Afib;  Surgeon: Will Meredith Leeds, MD;  Location: [place] CV LAB;  Service: Cardiovascular;  Laterality: N/A;  . ELECTROPHYSIOLOGIC STUDY N/A 08/15/2016   Procedure: Atrial Fibrillation Ablation;  Surgeon: Will Meredith Leeds, MD;  Location: [place] CV LAB;  Service: Cardiovascular;  Laterality: N/A;  . EYE SURGERY  2015   b/l cataracts removed  . left elbow surgery  06/03/11   cyst removed  . LUMBAR LAMINECTOMY  1960   x 2  . PACEMAKER IMPLANT N/A 09/19/2020   Procedure: PACEMAKER IMPLANT;  Surgeon: Constance Haw, MD;  Location: [place] CV LAB;  Service: Cardiovascular;  Laterality: N/A;  . POSTERIOR CERVICAL FUSION/FORAMINOTOMY  08/03/2012   Procedure: POSTERIOR CERVICAL FUSION/FORAMINOTOMY LEVEL 5;  Surgeon: [name], MD;  Location: [place]  ORS;  Service: Neurosurgery;  Laterality: N/A;  Cervical four to Thoracic two Posterior cervical decompression with Facet and Pedicle screw fixation  . TEE WITHOUT CARDIOVERSION N/A 10/22/2017   Procedure: TRANSESOPHAGEAL ECHOCARDIOGRAM (TEE);  Surgeon: Pixie Casino, MD;  Location: [facility] ENDOSCOPY;  Service: Cardiovascular;  Laterality: N/A;  . TONSILLECTOMY  1943  . TOTAL HIP ARTHROPLASTY Left 2012  . TOTAL KNEE ARTHROPLASTY Right  11/20/2014   Procedure: RIGHT TOTAL KNEE ARTHROPLASTY;  Surgeon: [name], MD;  Location: [facility] ORS;  Service: Orthopedics;  Laterality: Right;  . veins stripped  1970  . WRIST SURGERY Right 1990's   cyst removed    Current Outpatient Medications  Medication Sig Dispense Refill  . amiodarone (PACERONE) 200 MG tablet TAKE ONE TABLET BY MOUTH EVERY MORNING (Patient taking differently: Take 200 mg by mouth daily.) 90 tablet 3  . atorvastatin (LIPITOR) 10 MG tablet Take 10 mg by mouth daily.    Marland Kitchen CALCIUM+D3 600-20 MG-MCG TABS Take 1 tablet by mouth at bedtime.    . colchicine 0.6 MG tablet Take by mouth.    Arne Cleveland 5 MG TABS tablet Take 1 tablet by mouth twice daily. (Patient taking differently: Take 5 mg by mouth 2 (two) times daily.) 60 tablet 3  . esomeprazole (NEXIUM) 40 MG capsule TAKE ONE CAPSULE BY MOUTH EVERY DAY AT 12 NOON (Patient taking differently: Take 40 mg by mouth daily before breakfast.) 90 capsule 2  . eszopiclone (LUNESTA) 2 MG TABS tablet Take 1 tablet (2 mg total) by mouth at bedtime as needed for sleep. Take immediately before bedtime (Patient taking differently: Take 2 mg by mouth at bedtime. Take immediately before bedtime) 30 tablet 2  . ezetimibe (ZETIA) 10 MG tablet TAKE 10 MG BY MOUTH AT BEDTIME (Patient taking differently: Take 10 mg by mouth at bedtime.) 30 tablet 6  . ferrous sulfate 325 (65 FE) MG tablet Take 1 tablet by mouth every other day.     . gabapentin (NEURONTIN) 600 MG tablet TAKE 1 TABLET(600 MG) BY MOUTH TWICE DAILY (Patient taking differently: Take 600 mg by mouth 2 (two) times daily.) 180 tablet 0  . ibuprofen (ADVIL) 600 MG tablet Take by mouth.    . levothyroxine (SYNTHROID) 25 MCG tablet Take 25 mcg by mouth daily before breakfast.    . Melatonin 10 MG TABS Take 10 mg by mouth at bedtime.    . metoprolol succinate (TOPROL-XL) 50 MG 24 hr tablet Take 2 tablets (100 mg total) by mouth at bedtime. Take with or immediately following a meal. (Patient  taking differently: Take 50 mg by mouth at bedtime. Take with or immediately following a meal.) 60 tablet 3  . Multiple Vitamin (MULTIVITAMIN WITH MINERALS) TABS tablet Take 1 tablet by mouth daily. Centrum Silver for Women    . nortriptyline (PAMELOR) 50 MG capsule Take 100 mg by mouth at bedtime.    Vladimir Faster Glycol-Propyl Glycol (SYSTANE OP) Place 1 drop into both eyes 4 (four) times daily as needed (dry/irritated eyes.).     Marland Kitchen acetaminophen (TYLENOL) 500 MG tablet Take 500-1,000 mg by mouth every 6 (six) hours as needed (pain.).    Marland Kitchen clobetasol (TEMOVATE) 0.05 % GEL Apply 1 application topically 2 (two) times daily as needed (skin irritation.). APPLY TO THE AFFECTED AREA OF SKIN ON ARMS TWICE DAILY  0  . denosumab (PROLIA) 60 MG/ML SOSY injection Inject 60 mg into the skin every 6 (six) months.     No current facility-administered medications  for this encounter.    Allergies  Allergen Reactions  . Cefuroxime Axetil Anaphylaxis  . Macrodantin Anaphylaxis  . Alendronate Sodium Other (See Comments)    Severe chest pain similar to heart attack   . Codeine Nausea And Vomiting  . Demerol [Meperidine] Nausea And Vomiting and Other (See Comments)    hallucinations  . Other Other (See Comments)    Hismanal and Maprobamate portobello mushrooms - diarrhea  . Penicillins Swelling and Other (See Comments)    Has patient had a PCN reaction causing immediate rash, facial/tongue/throat swelling, SOB or lightheadedness with hypotension: Yes Has patient had a PCN reaction causing severe rash involving mucus membranes or skin necrosis: No Has patient had a PCN reaction that required hospitalization: Yes Has patient had a PCN reaction occurring within the last 10 years: Yes If all of the above answers are "NO", then may proceed with Cephalosporin use.   . Sulfonamide Derivatives Hives, Itching and Swelling    Social History   Socioeconomic History  . Marital status: Divorced    Spouse name:  Not on file  . Number of children: Not on file  . Years of education: Not on file  . Highest education level: Not on file  Occupational History  . Not on file  Tobacco Use  . Smoking status: Former Smoker    Packs/day: 0.25    Years: 4.00    Pack years: 1.00    Types: Cigarettes  . Smokeless tobacco: Never Used  . Tobacco comment: quit 20+yrs ago  Vaping Use  . Vaping Use: Never used  Substance and Sexual Activity  . Alcohol use: Yes    Alcohol/week: 0.0 standard drinks    Comment: WINE WITH DINNER. 2 GLASSES RED WINE  . Drug use: No  . Sexual activity: Yes    Partners: Male  Other Topics Concern  . Not on file  Social History Narrative  . Not on file   Social Determinants of Health   Financial Resource Strain: Not on file  Food Insecurity: Not on file  Transportation Needs: Not on file  Physical Activity: Not on file  Stress: Not on file  Social Connections: Not on file  Intimate Partner Violence: Not on file    Family History  Problem Relation Age of Onset  . Bipolar disorder Mother   . Stomach cancer Mother   . Mental illness Mother   . Anxiety disorder Mother   . Stroke Father   . Colon cancer Father   . HIV/AIDS Son   . Stomach cancer Maternal Grandfather   . Stomach cancer Paternal Grandmother   . Healthy Daughter   . Heart disease Maternal Grandmother   . Throat cancer Maternal Uncle        larynx  . Anesthesia problems Neg Hx   . Diabetes Neg Hx     ROS- All systems are reviewed and negative except as per the HPI above  Physical Exam: Vitals:   10/10/20 1123  BP: 140/80  Pulse: 93  Weight: 71.7 kg   Wt Readings from Last 3 Encounters:  10/10/20 71.7 kg  09/19/20 71 kg  08/09/20 71 kg    Labs: Lab Results  Component Value Date   NA 141 08/27/2020   K 4.3 08/27/2020   CL 106 08/27/2020   CO2 21 08/27/2020   GLUCOSE 110 (H) 08/27/2020   BUN 13 08/27/2020   CREATININE 0.87 08/27/2020   CALCIUM 8.9 08/27/2020   PHOS 2.4 08/03/2014    MG  1.9 12/25/2015   Lab Results  Component Value Date   INR 0.98 07/07/2015   Lab Results  Component Value Date   CHOL 161 04/16/2019   HDL 53 04/16/2019   LDLCALC 84 04/16/2019   TRIG 120 04/16/2019    GEN- The patient is well appearing elderly female, alert and oriented x 3 today.   HEENT-head normocephalic, atraumatic, sclera clear, conjunctiva pink, hearing intact, trachea midline. Lungs- Clear to ausculation bilaterally, normal work of breathing Heart- irregular rate and rhythm, no murmurs, rubs or gallops  GI- soft, NT, ND, + BS Extremities- no clubbing, cyanosis, or edema MS- no significant deformity or atrophy Skin- no rash or lesion Psych- euthymic mood, full affect Neuro- strength and sensation are intact   EKG- afib, artifact V5, QRS 106, QTc 469   Assessment and Plan: 1. Persistent afib  Patient remains in rate controlled afib.  Will plan for repeat DCCV after 3 weeks of uninterrupted anticoagulation (on or after 2/1) Check bmet/CBC Will check echo per Dr Curt Bears rec. Continue amiodarone 200 mg daily  Continue Toprol 50 mg daily Continue Eliquis 5 mg BID  2. CHA2DS2VASc score of 5 Continue eliquis 5 mg bid   3. Tachybradycardia Syndrome S/p PPM, followed by Dr Curt Bears and the device clinic. Wound check today.  4. Snoring/daytime somnolence  The importance of adequate treatment of sleep apnea was discussed today in order to improve our ability to maintain sinus rhythm long term. Will refer for sleep study.   5. Pericarditis On colchicine and ibuprofen No chest pain today.   Follow up in the AF clinic one week post DCCV.    [facility] [address] [place]  [address] [phone]

## 2020-10-10 ENCOUNTER — Ambulatory Visit (HOSPITAL_COMMUNITY)
Admission: RE | Admit: 2020-10-10 | Discharge: 2020-10-10 | Disposition: A | Discharge status: Home / Self Care | Payer: Medicare Other | Source: Ambulatory Visit | Attending: Physician Assistant | Admitting: Physician Assistant

## 2020-10-10 ENCOUNTER — Ambulatory Visit (INDEPENDENT_AMBULATORY_CARE_PROVIDER_SITE_OTHER): Payer: Medicare Other | Admitting: Student

## 2020-10-10 ENCOUNTER — Other Ambulatory Visit: Payer: Self-pay

## 2020-10-10 ENCOUNTER — Encounter (HOSPITAL_COMMUNITY): Payer: Self-pay | Admitting: Physician Assistant

## 2020-10-10 VITALS — BP 140/80 | HR 93 | Wt 158.0 lb

## 2020-10-10 DIAGNOSIS — I4892 Unspecified atrial flutter: Secondary | ICD-10-CM | POA: Insufficient documentation

## 2020-10-10 DIAGNOSIS — I319 Disease of pericardium, unspecified: Secondary | ICD-10-CM | POA: Diagnosis not present

## 2020-10-10 DIAGNOSIS — I480 Paroxysmal atrial fibrillation: Secondary | ICD-10-CM | POA: Diagnosis not present

## 2020-10-10 DIAGNOSIS — D509 Iron deficiency anemia, unspecified: Secondary | ICD-10-CM | POA: Diagnosis not present

## 2020-10-10 DIAGNOSIS — I10 Essential (primary) hypertension: Secondary | ICD-10-CM | POA: Insufficient documentation

## 2020-10-10 DIAGNOSIS — I313 Pericardial effusion (noninflammatory): Secondary | ICD-10-CM | POA: Diagnosis not present

## 2020-10-10 DIAGNOSIS — I495 Sick sinus syndrome: Secondary | ICD-10-CM | POA: Insufficient documentation

## 2020-10-10 DIAGNOSIS — Z87891 Personal history of nicotine dependence: Secondary | ICD-10-CM | POA: Diagnosis not present

## 2020-10-10 DIAGNOSIS — D6869 Other thrombophilia: Secondary | ICD-10-CM | POA: Diagnosis not present

## 2020-10-10 DIAGNOSIS — R400 Somnolence: Secondary | ICD-10-CM | POA: Diagnosis not present

## 2020-10-10 DIAGNOSIS — Z79899 Other long term (current) drug therapy: Secondary | ICD-10-CM | POA: Diagnosis not present

## 2020-10-10 DIAGNOSIS — I252 Old myocardial infarction: Secondary | ICD-10-CM | POA: Diagnosis not present

## 2020-10-10 DIAGNOSIS — I4819 Other persistent atrial fibrillation: Secondary | ICD-10-CM | POA: Diagnosis not present

## 2020-10-10 DIAGNOSIS — Z950 Presence of cardiac pacemaker: Secondary | ICD-10-CM | POA: Insufficient documentation

## 2020-10-10 DIAGNOSIS — Z7901 Long term (current) use of anticoagulants: Secondary | ICD-10-CM | POA: Diagnosis not present

## 2020-10-10 DIAGNOSIS — E785 Hyperlipidemia, unspecified: Secondary | ICD-10-CM | POA: Diagnosis not present

## 2020-10-10 LAB — CBC
HCT: 34.6 % — ABNORMAL LOW (ref 36.0–46.0)
Hemoglobin: 11.3 g/dL — ABNORMAL LOW (ref 12.0–15.0)
MCH: 30.2 pg (ref 26.0–34.0)
MCHC: 32.7 g/dL (ref 30.0–36.0)
MCV: 92.5 fL (ref 80.0–100.0)
Platelets: 421 10*3/uL — ABNORMAL HIGH (ref 150–400)
RBC: 3.74 MIL/uL — ABNORMAL LOW (ref 3.87–5.11)
RDW: 13.2 % (ref 11.5–15.5)
WBC: 7.9 10*3/uL (ref 4.0–10.5)
nRBC: 0 % (ref 0.0–0.2)

## 2020-10-10 LAB — BASIC METABOLIC PANEL
Anion gap: 9 (ref 5–15)
BUN: 12 mg/dL (ref 8–23)
CO2: 22 mmol/L (ref 22–32)
Calcium: 8.3 mg/dL — ABNORMAL LOW (ref 8.9–10.3)
Chloride: 106 mmol/L (ref 98–111)
Creatinine, Ser: 0.71 mg/dL (ref 0.44–1.00)
GFR, Estimated: >60 mL/min (ref >60)
Glucose, Bld: 110 mg/dL — ABNORMAL HIGH (ref 70–99)
Potassium: 3.8 mmol/L (ref 3.5–5.1)
Sodium: 137 mmol/L (ref 135–145)

## 2020-10-10 NOTE — Patient Instructions (Addendum)
Cardioversion scheduled for Tuesday, February 1st  - Arrive at the [facility] and go to admitting at  930AM  - Do not eat or drink anything after midnight the night prior to your procedure.  - Take all your morning medication (except diabetic medications) with a sip of water prior to arrival.  - You will not be able to drive home after your procedure.  - Do NOT miss any doses of your blood thinner - if you should miss a dose please notify our office immediately.  - If you feel as if you go back into normal rhythm prior to scheduled cardioversion, please notify our office immediately. If your procedure is canceled in the cardioversion suite you will be charged a cancellation fee.

## 2020-10-10 NOTE — Progress Notes (Signed)
Wound check appointment. Steri-strips removed. Wound without redness or edema. Incision edges approximated, wound well healed. Normal device function. Sensing and impedances consistent with implant measurements. RV threshold stable. No Atrial threshold with AF. Device programmed at 3.5V for extra safety margin until 3 month visit. Histogram distribution appropriate for patient and level of activity. AF > 98%. Scheduled for [facility] 2/1. Patient educated about wound care, arm mobility, lifting restrictions. ROV in 3 months with implanting physician.  Pt admitted in [place] earlier this month and diagnosed/treated for pericarditis. Her NOAC was transiently held and then resumed, thus her [facility] has been deferred for 3 weeks of uninterrupted [place], 2/1 or after.   Legrand Como [address] [place], PA-C  10/10/2020 1:31 PM

## 2020-10-11 ENCOUNTER — Ambulatory Visit (HOSPITAL_COMMUNITY)
Admission: RE | Admit: 2020-10-11 | Discharge: 2020-10-11 | Disposition: A | Discharge status: Home / Self Care | Payer: Medicare Other | Source: Ambulatory Visit | Attending: Physician Assistant | Admitting: Physician Assistant

## 2020-10-11 DIAGNOSIS — I313 Pericardial effusion (noninflammatory): Secondary | ICD-10-CM | POA: Insufficient documentation

## 2020-10-11 DIAGNOSIS — I4892 Unspecified atrial flutter: Secondary | ICD-10-CM | POA: Diagnosis not present

## 2020-10-11 DIAGNOSIS — I4819 Other persistent atrial fibrillation: Secondary | ICD-10-CM

## 2020-10-11 DIAGNOSIS — I10 Essential (primary) hypertension: Secondary | ICD-10-CM | POA: Insufficient documentation

## 2020-10-11 DIAGNOSIS — I340 Nonrheumatic mitral (valve) insufficiency: Secondary | ICD-10-CM | POA: Insufficient documentation

## 2020-10-11 DIAGNOSIS — E785 Hyperlipidemia, unspecified: Secondary | ICD-10-CM | POA: Insufficient documentation

## 2020-10-11 LAB — ECHOCARDIOGRAM COMPLETE
Calc EF: 42 %
S' Lateral: 3.3 cm
Single Plane A2C EF: 43.7 %
Single Plane A4C EF: 40.9 %

## 2020-10-11 NOTE — Progress Notes (Signed)
  Echocardiogram 2D Echocardiogram has been performed.  Chelsea Hancock 10/11/2020, 4:06 PM

## 2020-10-16 ENCOUNTER — Telehealth: Payer: Self-pay | Admitting: *Deleted

## 2020-10-16 ENCOUNTER — Ambulatory Visit: Payer: Medicare Other | Admitting: Podiatry

## 2020-10-16 NOTE — Telephone Encounter (Signed)
Staff message sent to Nina ok to schedule sleep study. No PA is required. Patient has medicare. 

## 2020-10-20 ENCOUNTER — Other Ambulatory Visit (HOSPITAL_COMMUNITY)
Admission: RE | Admit: 2020-10-20 | Discharge: 2020-10-20 | Disposition: A | Discharge status: Home / Self Care | Payer: Medicare Other | Source: Ambulatory Visit | Attending: Cardiology | Admitting: Cardiology

## 2020-10-20 DIAGNOSIS — Z20822 Contact with and (suspected) exposure to covid-19: Secondary | ICD-10-CM | POA: Diagnosis not present

## 2020-10-20 DIAGNOSIS — Z01812 Encounter for preprocedural laboratory examination: Secondary | ICD-10-CM | POA: Diagnosis not present

## 2020-10-20 LAB — SARS CORONAVIRUS 2 (TAT 6-24 HRS): SARS Coronavirus 2: NEGATIVE

## 2020-10-22 DIAGNOSIS — N390 Urinary tract infection, site not specified: Secondary | ICD-10-CM | POA: Diagnosis not present

## 2020-10-23 ENCOUNTER — Encounter (HOSPITAL_COMMUNITY)
Admission: RE | Disposition: A | Discharge status: Home / Self Care | Payer: Self-pay | Source: Home / Self Care | Attending: Cardiology

## 2020-10-23 ENCOUNTER — Ambulatory Visit (HOSPITAL_COMMUNITY): Payer: Medicare Other | Admitting: Anesthesiology

## 2020-10-23 ENCOUNTER — Ambulatory Visit (HOSPITAL_COMMUNITY)
Admission: RE | Admit: 2020-10-23 | Discharge: 2020-10-23 | Disposition: A | Discharge status: Home / Self Care | Payer: Medicare Other | Attending: Cardiology | Admitting: Cardiology

## 2020-10-23 ENCOUNTER — Encounter (HOSPITAL_COMMUNITY): Payer: Self-pay | Admitting: Cardiology

## 2020-10-23 ENCOUNTER — Other Ambulatory Visit: Payer: Self-pay

## 2020-10-23 DIAGNOSIS — I495 Sick sinus syndrome: Secondary | ICD-10-CM | POA: Insufficient documentation

## 2020-10-23 DIAGNOSIS — Z882 Allergy status to sulfonamides status: Secondary | ICD-10-CM | POA: Diagnosis not present

## 2020-10-23 DIAGNOSIS — Z79890 Hormone replacement therapy (postmenopausal): Secondary | ICD-10-CM | POA: Diagnosis not present

## 2020-10-23 DIAGNOSIS — R400 Somnolence: Secondary | ICD-10-CM | POA: Insufficient documentation

## 2020-10-23 DIAGNOSIS — R0683 Snoring: Secondary | ICD-10-CM | POA: Insufficient documentation

## 2020-10-23 DIAGNOSIS — Z950 Presence of cardiac pacemaker: Secondary | ICD-10-CM | POA: Diagnosis not present

## 2020-10-23 DIAGNOSIS — I4819 Other persistent atrial fibrillation: Secondary | ICD-10-CM | POA: Diagnosis not present

## 2020-10-23 DIAGNOSIS — I319 Disease of pericardium, unspecified: Secondary | ICD-10-CM | POA: Diagnosis not present

## 2020-10-23 DIAGNOSIS — Z87891 Personal history of nicotine dependence: Secondary | ICD-10-CM | POA: Insufficient documentation

## 2020-10-23 DIAGNOSIS — Z79899 Other long term (current) drug therapy: Secondary | ICD-10-CM | POA: Diagnosis not present

## 2020-10-23 DIAGNOSIS — Z7901 Long term (current) use of anticoagulants: Secondary | ICD-10-CM | POA: Insufficient documentation

## 2020-10-23 DIAGNOSIS — Z880 Allergy status to penicillin: Secondary | ICD-10-CM | POA: Diagnosis not present

## 2020-10-23 DIAGNOSIS — I4891 Unspecified atrial fibrillation: Secondary | ICD-10-CM | POA: Diagnosis not present

## 2020-10-23 DIAGNOSIS — I110 Hypertensive heart disease with heart failure: Secondary | ICD-10-CM | POA: Diagnosis not present

## 2020-10-23 DIAGNOSIS — Z888 Allergy status to other drugs, medicaments and biological substances status: Secondary | ICD-10-CM | POA: Diagnosis not present

## 2020-10-23 DIAGNOSIS — Z885 Allergy status to narcotic agent status: Secondary | ICD-10-CM | POA: Diagnosis not present

## 2020-10-23 DIAGNOSIS — I484 Atypical atrial flutter: Secondary | ICD-10-CM | POA: Diagnosis not present

## 2020-10-23 DIAGNOSIS — I5022 Chronic systolic (congestive) heart failure: Secondary | ICD-10-CM | POA: Diagnosis not present

## 2020-10-23 HISTORY — PX: CARDIOVERSION: SHX1299

## 2020-10-23 SURGERY — CARDIOVERSION
Anesthesia: General

## 2020-10-23 MED ORDER — SODIUM CHLORIDE 0.9 % IV SOLN: INTRAVENOUS | Status: DC | PRN

## 2020-10-23 MED ORDER — PROPOFOL 10 MG/ML IV BOLUS
INTRAVENOUS | Status: DC | PRN
  Administered 2020-10-23: 40 mg via INTRAVENOUS

## 2020-10-23 MED ORDER — LIDOCAINE HCL (CARDIAC) PF 100 MG/5ML IV SOSY
PREFILLED_SYRINGE | INTRAVENOUS | Status: DC | PRN
  Administered 2020-10-23: 60 mg via INTRATRACHEAL

## 2020-10-23 NOTE — Transfer of Care (Signed)
Immediate Anesthesia Transfer of Care Note  Patient: Chelsea Hancock  Procedure(s) Performed: CARDIOVERSION (N/A )  Patient Location: Endoscopy Unit  Anesthesia Type:General  Level of Consciousness: drowsy and patient cooperative  Airway & Oxygen Therapy: Patient Spontanous Breathing and Patient connected to face mask oxygen  Post-op Assessment: Report given to RN and Post -op Vital signs reviewed and stable  Post vital signs: Reviewed and stable  Last Vitals:  Vitals Value Taken Time  BP 113/58   Temp    Pulse 80   Resp 20   SpO2 100     Last Pain:  Vitals:   10/23/20 1022  TempSrc: Temporal  PainSc: 0-No pain         Complications: No complications documented.

## 2020-10-23 NOTE — Anesthesia Procedure Notes (Signed)
Procedure Name: General with mask airway Date/Time: 10/23/2020 10:37 AM Performed by: Kathryne Hitch, CRNA Pre-anesthesia Checklist: Patient identified, Emergency Drugs available, Suction available and Patient being monitored Patient Re-evaluated:Patient Re-evaluated prior to induction Oxygen Delivery Method: Ambu bag Preoxygenation: Pre-oxygenation with 100% oxygen Induction Type: IV induction Ventilation: Mask ventilation without difficulty Placement Confirmation: positive ETCO2 Dental Injury: Teeth and Oropharynx as per pre-operative assessment

## 2020-10-23 NOTE — CV Procedure (Signed)
Procedure:   DCCV  Indication:  Symptomatic atrial fibrillation  Procedure Note:  The patient's HCPOA signed informed consent.  They have had had therapeutic anticoagulation with apixaban greater than 3 weeks.  Anesthesia was administered by Dr. Daiva Huge.  Patient received 60 mg IV lidocaine and 40 mg IV propofol.Adequate airway was maintained throughout and vital followed per protocol.  They were cardioverted x 2 with 150, 200J of biphasic synchronized energy.  They converted to atrial-sensed, ventricular-paced rhythm, confirmed with Engineer, agricultural.  There were no apparent complications.  The patient had normal neuro status and respiratory status post procedure with vitals stable as recorded elsewhere.    Follow up:  They will continue on current medical therapy and follow up with cardiology as scheduled.  Buford Dresser, MD PhD 10/23/2020 10:49 AM

## 2020-10-23 NOTE — Anesthesia Postprocedure Evaluation (Signed)
Anesthesia Post Note  Patient: Chelsea Hancock  Procedure(s) Performed: CARDIOVERSION (N/A )     Patient location during evaluation: PACU Anesthesia Type: General Level of consciousness: awake and alert and oriented Pain management: pain level controlled Vital Signs Assessment: post-procedure vital signs reviewed and stable Respiratory status: spontaneous breathing, nonlabored ventilation and respiratory function stable Cardiovascular status: blood pressure returned to baseline Postop Assessment: no apparent nausea or vomiting Anesthetic complications: no   No complications documented.  Last Vitals:  Vitals:   10/23/20 1054 10/23/20 1059  BP: (!) 97/49 98/64  Pulse: 62 65  Resp: 20 (!) 23  Temp: 36.5 C   SpO2: 100% 100%    Last Pain:  Vitals:   10/23/20 1059  TempSrc:   PainSc: [place]

## 2020-10-23 NOTE — Anesthesia Preprocedure Evaluation (Addendum)
Anesthesia Evaluation  Patient identified by MRN, date of birth, ID band Patient awake and Patient confused    Reviewed: Allergy & Precautions, NPO status , Patient's Chart, lab work & pertinent test results, reviewed documented beta blocker date and time   History of Anesthesia Complications Negative for: history of anesthetic complications  Airway Mallampati: II  TM Distance: >3 FB Neck ROM: Full    Dental no notable dental hx.    Pulmonary former smoker,    Pulmonary exam normal        Cardiovascular hypertension, Pt. on medications and Pt. on home beta blockers +CHF  Normal cardiovascular exam+ dysrhythmias Atrial Fibrillation   TTE 10/11/20: EF 35-40%, global hypokinesis, mild LAE, 1-1.3 cm pericardial effusion, mild to moderate MR   Neuro/Psych  Headaches, Anxiety Depression AAO to person and place but not year (per daughter this is baseline)    GI/Hepatic Neg liver ROS, PUD, GERD  Medicated and Controlled,  Endo/Other  Hypothyroidism   Renal/GU Renal InsufficiencyRenal disease  negative genitourinary   Musculoskeletal  (+) Arthritis , Fibromyalgia -  Abdominal   Peds  Hematology negative hematology ROS (+)   Anesthesia Other Findings Day of surgery medications reviewed with patient.  Reproductive/Obstetrics negative OB ROS                            Anesthesia Physical Anesthesia Plan  ASA: III  Anesthesia Plan: General   Post-op Pain Management:    Induction: Intravenous  PONV Risk Score and Plan: 3 and Treatment may vary due to age or medical condition and Propofol infusion  Airway Management Planned: Mask  Additional Equipment: None  Intra-op Plan:   Post-operative Plan:   Informed Consent: I have reviewed the patients History and Physical, chart, labs and discussed the procedure including the risks, benefits and alternatives for the proposed anesthesia with the  patient or authorized representative who has indicated his/her understanding and acceptance.       Plan Discussed with: CRNA  Anesthesia Plan Comments:        Anesthesia Quick Evaluation

## 2020-10-23 NOTE — Interval H&P Note (Signed)
History and Physical Interval Note:  10/23/2020 10:39 AM  Chelsea Hancock  has presented today for surgery, with the diagnosis of AFIB.  The various methods of treatment have been discussed with the patient and family. After consideration of risks, benefits and other options for treatment, the patient has consented to  Procedure(s): CARDIOVERSION (N/A) as a surgical intervention.  The patient's history has been reviewed, patient examined, no change in status, stable for surgery.  I have reviewed the patient's chart and labs.  Questions were answered to the patient's satisfaction.     Buford Dresser  I spent extensive time discussing procedure with patient and her daughter. They noted that after her Covid test1/29, she has a transient episode of confusion. No motor defects. Called PCP office on call, recommended to go to ER. Called 911, vitals stable and symptoms resolved. Elected not to come to ER. Got urine sample yesterday at PCP office, told it may be early UTI. Started on doxycycline.  We discussed that anesthesia can affect mental status and that there is a risk of stroke with cardioversion. Discussed risk/benefit. They wish to proceed with procedure today.

## 2020-10-24 ENCOUNTER — Encounter (HOSPITAL_COMMUNITY): Payer: Self-pay | Admitting: Cardiology

## 2020-10-29 ENCOUNTER — Other Ambulatory Visit: Payer: Self-pay

## 2020-10-29 DIAGNOSIS — M810 Age-related osteoporosis without current pathological fracture: Secondary | ICD-10-CM | POA: Diagnosis not present

## 2020-10-29 DIAGNOSIS — E7800 Pure hypercholesterolemia, unspecified: Secondary | ICD-10-CM | POA: Diagnosis not present

## 2020-10-29 DIAGNOSIS — E039 Hypothyroidism, unspecified: Secondary | ICD-10-CM | POA: Diagnosis not present

## 2020-10-29 DIAGNOSIS — H35033 Hypertensive retinopathy, bilateral: Secondary | ICD-10-CM | POA: Diagnosis not present

## 2020-10-29 DIAGNOSIS — K219 Gastro-esophageal reflux disease without esophagitis: Secondary | ICD-10-CM | POA: Diagnosis not present

## 2020-10-29 DIAGNOSIS — I480 Paroxysmal atrial fibrillation: Secondary | ICD-10-CM | POA: Diagnosis not present

## 2020-10-29 DIAGNOSIS — D509 Iron deficiency anemia, unspecified: Secondary | ICD-10-CM | POA: Diagnosis not present

## 2020-10-29 NOTE — Telephone Encounter (Signed)
Pt's pharmacy [facility], requesting a refill on Metoprolol. This medication was changed in the hospital. Would Dr. Curt Bears like to refill this medication? Please address

## 2020-10-30 ENCOUNTER — Other Ambulatory Visit: Payer: Self-pay

## 2020-10-30 ENCOUNTER — Ambulatory Visit (HOSPITAL_COMMUNITY)
Admission: RE | Admit: 2020-10-30 | Discharge: 2020-10-30 | Disposition: A | Discharge status: Home / Self Care | Payer: Medicare Other | Source: Ambulatory Visit | Attending: Physician Assistant | Admitting: Physician Assistant

## 2020-10-30 VITALS — BP 134/98 | HR 111 | Ht 62.0 in | Wt 155.4 lb

## 2020-10-30 DIAGNOSIS — E785 Hyperlipidemia, unspecified: Secondary | ICD-10-CM | POA: Insufficient documentation

## 2020-10-30 DIAGNOSIS — I10 Essential (primary) hypertension: Secondary | ICD-10-CM | POA: Diagnosis not present

## 2020-10-30 DIAGNOSIS — Z950 Presence of cardiac pacemaker: Secondary | ICD-10-CM | POA: Diagnosis not present

## 2020-10-30 DIAGNOSIS — Z87891 Personal history of nicotine dependence: Secondary | ICD-10-CM | POA: Diagnosis not present

## 2020-10-30 DIAGNOSIS — I495 Sick sinus syndrome: Secondary | ICD-10-CM | POA: Insufficient documentation

## 2020-10-30 DIAGNOSIS — Z7901 Long term (current) use of anticoagulants: Secondary | ICD-10-CM | POA: Insufficient documentation

## 2020-10-30 DIAGNOSIS — R0683 Snoring: Secondary | ICD-10-CM | POA: Insufficient documentation

## 2020-10-30 DIAGNOSIS — D6869 Other thrombophilia: Secondary | ICD-10-CM

## 2020-10-30 DIAGNOSIS — I429 Cardiomyopathy, unspecified: Secondary | ICD-10-CM | POA: Insufficient documentation

## 2020-10-30 DIAGNOSIS — I4892 Unspecified atrial flutter: Secondary | ICD-10-CM | POA: Diagnosis not present

## 2020-10-30 DIAGNOSIS — I313 Pericardial effusion (noninflammatory): Secondary | ICD-10-CM | POA: Insufficient documentation

## 2020-10-30 DIAGNOSIS — I447 Left bundle-branch block, unspecified: Secondary | ICD-10-CM | POA: Insufficient documentation

## 2020-10-30 DIAGNOSIS — I484 Atypical atrial flutter: Secondary | ICD-10-CM | POA: Diagnosis not present

## 2020-10-30 DIAGNOSIS — I4430 Unspecified atrioventricular block: Secondary | ICD-10-CM | POA: Diagnosis not present

## 2020-10-30 DIAGNOSIS — I4819 Other persistent atrial fibrillation: Secondary | ICD-10-CM | POA: Insufficient documentation

## 2020-10-30 MED ORDER — AMIODARONE HCL 200 MG PO TABS: 200.0000 mg | ORAL_TABLET | Freq: Two times a day (BID) | ORAL | 3 refills | Status: DC

## 2020-10-30 MED ORDER — METOPROLOL SUCCINATE ER 50 MG PO TB24: 50.0000 mg | ORAL_TABLET | Freq: Every day | ORAL | 3 refills | Status: DC

## 2020-10-30 NOTE — Patient Instructions (Signed)
Increase amiodarone to 200mg twice a day 

## 2020-10-30 NOTE — Telephone Encounter (Signed)
Spoke to pt and dtr (currently on their way to [facility] for follow up). They will address refill needs at their appt this afternoon, in case there are any med changes.  Aware to ask [facility] to send in refills if pt remains on Toprol. They are agreeable to plan.   Will forward to [facility], who is seeing the pt this afternoon, for his FYI.

## 2020-10-30 NOTE — Telephone Encounter (Signed)
Made pt & dtr aware we are still waiting on echo result.  Informed that I spoke with medical records again today. They appreciate my update

## 2020-10-30 NOTE — Progress Notes (Signed)
Primary Care Physician: Lajean Manes, MD Referring Physician: Dr. Margaretha Sheffield Chelsea Hancock is a 87 y.o. female with a h/o afib, s/p 2 ablations and has failed flecainide in the past and is on amiodarone 200 mg daily. She saw Dr. Curt Bears in October 2021 and was in Afib with RVR. He added Toprol  XL 50 mg daily and scheduled her for cardioversion. CV MD stopped Toprol at time of cardioversion for HR's in  the low 50's in  SR. She is now in afib in the clinic with RVR. I discussed with Dr. Curt Bears, appearing to have tachy/brady and the possible need for PPM. Patient is s/p PPM implant 09/19/21. She was hospitalized at [facility] 1/8-1/11/22 with pericarditis. She was treated with colchicine and ibuprofen. She also reports significant snoring and daytime somnolence.   On follow up today, patient is s/p DCCV on 10/11/20. Unfortunately, she has had early return of atrial flutter. She had an echo which showed decreased EF 35-40% with a moderate pericardial effusion. She still has symptoms of dyspnea with exertion and fatigue. She denies any bleeding issues on anticoagulation.   Today, she denies symptoms of palpitations, chest pain, orthopnea, PND, lower extremity edema, dizziness, presyncope, syncope, or neurologic sequela. The patient is tolerating medications without difficulties and is otherwise without complaint today.   Past Medical History:  Diagnosis Date  . Atrial fibrillation and flutter ([place])    a. s/p PVI ablation in 07/2015 with continue PAF  b. s/p repeat ablation on 08/15/16. continued on Flecainide 100mg  BID  . Carotid bruit    Right  . Chronic back pain   . Depression with anxiety 03/29/2013  . Diverticulosis   . Fibromyalgia   . Foot drop, right   . GERD (gastroesophageal reflux disease)   . Hemorrhoids   . HTN (hypertension)   . Hyperlipidemia   . IBS (irritable bowel syndrome)   . Memory loss 02/11/2015  . Onychomycosis 07/03/2013  .  Osteoporosis   . Sigmoid colon ulcer 03/23/2012   Past Surgical History:  Procedure Laterality Date  . ABDOMINAL HYSTERECTOMY  1981  . BACK SURGERY  1997   reconstructive lower back surgery  . CARDIOVERSION N/A 07/01/2016   Procedure: CARDIOVERSION;  Surgeon: Thayer Headings, MD;  Location: [facility];  Service: Cardiovascular;  Laterality: N/A;  . CARDIOVERSION N/A 10/22/2017   Procedure: CARDIOVERSION;  Surgeon: Pixie Casino, MD;  Location: [facility] ENDOSCOPY;  Service: Cardiovascular;  Laterality: N/A;  . CARDIOVERSION N/A 07/19/2020   Procedure: CARDIOVERSION;  Surgeon: Freada Bergeron, MD;  Location: [facility] ENDOSCOPY;  Service: Cardiovascular;  Laterality: N/A;  . CARDIOVERSION N/A 10/23/2020   Procedure: CARDIOVERSION;  Surgeon: Buford Dresser, MD;  Location: [facility] ENDOSCOPY;  Service: Cardiovascular;  Laterality: N/A;  . CARPAL TUNNEL RELEASE Left 2012   ulnar nerve release at elbow  . CERVICAL FUSION  2003, 2005, 2013  . COLONOSCOPY    . ELECTROPHYSIOLOGIC STUDY N/A 08/10/2015   Procedure: Afib;  Surgeon: Will Meredith Leeds, MD;  Location: [place] CV LAB;  Service: Cardiovascular;  Laterality: N/A;  . ELECTROPHYSIOLOGIC STUDY N/A 08/15/2016   Procedure: Atrial Fibrillation Ablation;  Surgeon: Will Meredith Leeds, MD;  Location: [place] CV LAB;  Service: Cardiovascular;  Laterality: N/A;  . EYE SURGERY  2015   b/l cataracts removed  . left elbow surgery  06/03/11   cyst removed  . LUMBAR LAMINECTOMY  1960   x 2  . PACEMAKER IMPLANT N/A 09/19/2020  Procedure: PACEMAKER IMPLANT;  Surgeon: Constance Haw, MD;  Location: [place] CV LAB;  Service: Cardiovascular;  Laterality: N/A;  . POSTERIOR CERVICAL FUSION/FORAMINOTOMY  08/03/2012   Procedure: POSTERIOR CERVICAL FUSION/FORAMINOTOMY LEVEL 5;  Surgeon: [name], MD;  Location: [place] NEURO ORS;  Service: Neurosurgery;  Laterality: N/A;  Cervical four to Thoracic two Posterior cervical decompression with Facet  and Pedicle screw fixation  . TEE WITHOUT CARDIOVERSION N/A 10/22/2017   Procedure: TRANSESOPHAGEAL ECHOCARDIOGRAM (TEE);  Surgeon: Pixie Casino, MD;  Location: [facility] ENDOSCOPY;  Service: Cardiovascular;  Laterality: N/A;  . TONSILLECTOMY  1943  . TOTAL HIP ARTHROPLASTY Left 2012  . TOTAL KNEE ARTHROPLASTY Right 11/20/2014   Procedure: RIGHT TOTAL KNEE ARTHROPLASTY;  Surgeon: [name], MD;  Location: [facility] ORS;  Service: Orthopedics;  Laterality: Right;  . veins stripped  1970  . WRIST SURGERY Right 1990's   cyst removed    Current Outpatient Medications  Medication Sig Dispense Refill  . amiodarone (PACERONE) 200 MG tablet TAKE ONE TABLET BY MOUTH EVERY MORNING (Patient taking differently: Take 200 mg by mouth daily.) 90 tablet 3  . atorvastatin (LIPITOR) 10 MG tablet Take 10 mg by mouth daily.    Marland Kitchen CALCIUM+D3 600-20 MG-MCG TABS Take 1 tablet by mouth at bedtime.    . clobetasol (TEMOVATE) 0.05 % GEL Apply 1 application topically 2 (two) times daily as needed (skin irritation.). APPLY TO THE AFFECTED AREA OF SKIN ON ARMS TWICE DAILY  0  . colchicine 0.6 MG tablet Take 0.6 mg by mouth daily.    Marland Kitchen denosumab (PROLIA) 60 MG/ML SOSY injection Inject 60 mg into the skin every 6 (six) months.    Marland Kitchen ELIQUIS 5 MG TABS tablet Take 1 tablet by mouth twice daily. (Patient taking differently: Take 5 mg by mouth 2 (two) times daily.) 60 tablet 3  . esomeprazole (NEXIUM) 40 MG capsule TAKE ONE CAPSULE BY MOUTH EVERY DAY AT 12 NOON (Patient taking differently: Take 40 mg by mouth daily before breakfast.) 90 capsule 2  . ezetimibe (ZETIA) 10 MG tablet TAKE 10 MG BY MOUTH AT BEDTIME (Patient taking differently: Take 10 mg by mouth at bedtime.) 30 tablet 6  . ferrous sulfate 325 (65 FE) MG tablet Take 325 mg by mouth daily with breakfast.    . gabapentin (NEURONTIN) 600 MG tablet TAKE 1 TABLET(600 MG) BY MOUTH TWICE DAILY (Patient taking differently: Take 600 mg by mouth 2 (two) times daily.) 180 tablet 0   . ibuprofen (ADVIL) 600 MG tablet Take 600 mg by mouth every 6 (six) hours as needed for moderate pain.    Marland Kitchen levothyroxine (SYNTHROID) 25 MCG tablet Take 25 mcg by mouth daily before breakfast.    . Melatonin 10 MG TABS Take 10 mg by mouth at bedtime.    . metoprolol succinate (TOPROL-XL) 50 MG 24 hr tablet Take 1 tablet (50 mg total) by mouth at bedtime. Take with or immediately following a meal. 1 tablet 0  . Multiple Vitamin (MULTIVITAMIN WITH MINERALS) TABS tablet Take 1 tablet by mouth daily. Centrum Silver for Women    . nortriptyline (PAMELOR) 50 MG capsule Take 100 mg by mouth at bedtime.    Vladimir Faster Glycol-Propyl Glycol (SYSTANE OP) Place 1 drop into both eyes 4 (four) times daily as needed (dry/irritated eyes.).      No current facility-administered medications for this encounter.    Allergies  Allergen Reactions  . Cefuroxime Axetil Anaphylaxis  . Macrodantin Anaphylaxis  . Alendronate Sodium  Other (See Comments)    Severe chest pain similar to heart attack   . Ciprofibrate Other (See Comments)  . Codeine Nausea And Vomiting  . Demerol [Meperidine] Nausea And Vomiting and Other (See Comments)    hallucinations  . Nitrofurantoin Nausea And Vomiting and Other (See Comments)  . Other Other (See Comments)    Hismanal and Maprobamate portobello mushrooms - diarrhea  . Penicillins Swelling and Other (See Comments)    Has patient had a PCN reaction causing immediate rash, facial/tongue/throat swelling, SOB or lightheadedness with hypotension: Yes Has patient had a PCN reaction causing severe rash involving mucus membranes or skin necrosis: No Has patient had a PCN reaction that required hospitalization: Yes Has patient had a PCN reaction occurring within the last 10 years: Yes If all of the above answers are "NO", then may proceed with Cephalosporin use.   . Pravastatin Other (See Comments)  . Sulfonamide Derivatives Hives, Itching and Swelling    Social History    Socioeconomic History  . Marital status: Divorced    Spouse name: Not on file  . Number of children: Not on file  . Years of education: Not on file  . Highest education level: Not on file  Occupational History  . Not on file  Tobacco Use  . Smoking status: Former Smoker    Packs/day: 0.25    Years: 4.00    Pack years: 1.00    Types: Cigarettes  . Smokeless tobacco: Never Used  . Tobacco comment: quit 20+yrs ago  Vaping Use  . Vaping Use: Never used  Substance and Sexual Activity  . Alcohol use: Yes    Alcohol/week: 0.0 standard drinks    Comment: WINE WITH DINNER. 2 GLASSES RED WINE  . Drug use: No  . Sexual activity: Yes    Partners: Male  Other Topics Concern  . Not on file  Social History Narrative  . Not on file   Social Determinants of Health   Financial Resource Strain: Not on file  Food Insecurity: Not on file  Transportation Needs: Not on file  Physical Activity: Not on file  Stress: Not on file  Social Connections: Not on file  Intimate Partner Violence: Not on file    Family History  Problem Relation Age of Onset  . Bipolar disorder Mother   . Stomach cancer Mother   . Mental illness Mother   . Anxiety disorder Mother   . Stroke Father   . Colon cancer Father   . HIV/AIDS Son   . Stomach cancer Maternal Grandfather   . Stomach cancer Paternal Grandmother   . Healthy Daughter   . Heart disease Maternal Grandmother   . Throat cancer Maternal Uncle        larynx  . Anesthesia problems Neg Hx   . Diabetes Neg Hx     ROS- All systems are reviewed and negative except as per the HPI above  Physical Exam: Vitals:   10/30/20 1329  BP: (!) 134/98  Pulse: (!) 111  Weight: 70.5 kg  Height: 5\' 2"  (1.575 m)   Wt Readings from Last 3 Encounters:  10/30/20 70.5 kg  10/23/20 71.7 kg  10/10/20 71.7 kg    Labs: Lab Results  Component Value Date   NA 137 10/10/2020   K 3.8 10/10/2020   CL 106 10/10/2020   CO2 22 10/10/2020   GLUCOSE 110 (H)  10/10/2020   BUN 12 10/10/2020   CREATININE 0.71 10/10/2020   CALCIUM 8.3 (L) 10/10/2020  PHOS 2.4 08/03/2014   MG 1.9 12/25/2015   Lab Results  Component Value Date   INR 0.98 07/07/2015   Lab Results  Component Value Date   CHOL 161 04/16/2019   HDL 53 04/16/2019   LDLCALC 84 04/16/2019   TRIG 120 04/16/2019    GEN- The patient is well appearing elderly female, alert and oriented x 3 today.   HEENT-head normocephalic, atraumatic, sclera clear, conjunctiva pink, hearing intact, trachea midline. Lungs- Clear to ausculation bilaterally, normal work of breathing Heart- irregular rate and rhythm, tachycardia, no murmurs, rubs or gallops  GI- soft, NT, ND, + BS Extremities- no clubbing, cyanosis, or edema MS- no significant deformity or atrophy Skin- no rash or lesion Psych- euthymic mood, full affect Neuro- strength and sensation are intact   EKG- atypical atrial flutter with variable block HR 111, QRS 110, QTc 516  Echo 10/11/20 demonstrated 1. Left ventricular ejection fraction, by estimation, is 35 to 40%. The  left ventricle has moderately decreased function. The left ventricle  demonstrates global hypokinesis. Left ventricular diastolic parameters are  indeterminate.  2. Right ventricular systolic function is normal. The right ventricular  size is normal. There is normal pulmonary artery systolic pressure.  3. Left atrial size was mildly dilated.  4. 1-1.3 cm pericardial effusion. Moderate pericardial effusion. The  pericardial effusion is circumferential. There is no evidence of cardiac  tamponade.  5. The mitral valve is normal in structure. Mild to moderate mitral valve  regurgitation. No evidence of mitral stenosis.  6. The aortic valve is normal in structure. Aortic valve regurgitation is  not visualized. Mild to moderate aortic valve sclerosis/calcification is  present, without any evidence of aortic stenosis.  7. The inferior vena cava is normal in size  with greater than 50%  respiratory variability, suggesting right atrial pressure of 3 mmHg.   Comparison(s): Pericardial effusion is new.   Assessment and Plan: 1. Persistent afib/atrial flutter Patient is s/p DCCV on 10/23/20 Patient back in atrial flutter. Recall she had hypotension on higher doses of rate control.  Increase amiodarone to 200 mg BID. Could consider repeat DCCV after loading. ? If she would be a candidate for AV nodal ablation.  Continue Toprol 50 mg daily Continue Eliquis 5 mg BID  2. CHA2DS2VASc score of 5 Continue eliquis 5 mg bid   3. Tachybradycardia Syndrome S/p PPM, followed by Dr Curt Bears and the device clinic.  4. Snoring/daytime somnolence  Referred for sleep study.  5. Cardiomyopathy  EF 35-40% No signs or symptoms of fluid overload.    Follow up with Dr Curt Bears as scheduled.    [facility] [address] [place], [place] [address] [phone]

## 2020-11-01 ENCOUNTER — Other Ambulatory Visit (HOSPITAL_COMMUNITY): Payer: Self-pay | Admitting: *Deleted

## 2020-11-01 MED ORDER — AMIODARONE HCL 200 MG PO TABS: 200.0000 mg | ORAL_TABLET | Freq: Two times a day (BID) | ORAL | 1 refills | Status: DC

## 2020-11-02 ENCOUNTER — Other Ambulatory Visit: Payer: Self-pay

## 2020-11-02 ENCOUNTER — Encounter: Payer: Self-pay | Admitting: Cardiology

## 2020-11-02 ENCOUNTER — Ambulatory Visit (INDEPENDENT_AMBULATORY_CARE_PROVIDER_SITE_OTHER): Payer: Medicare Other | Admitting: Cardiology

## 2020-11-02 VITALS — BP 110/70 | HR 99 | Ht 62.0 in | Wt 153.6 lb

## 2020-11-02 DIAGNOSIS — I4819 Other persistent atrial fibrillation: Secondary | ICD-10-CM

## 2020-11-02 DIAGNOSIS — Z01812 Encounter for preprocedural laboratory examination: Secondary | ICD-10-CM | POA: Diagnosis not present

## 2020-11-02 NOTE — Progress Notes (Signed)
Electrophysiology Office Note   Date:  11/02/2020   ID:  Chelsea Hancock, DOB [birth year], MRN [id number]  PCP:  Lajean Manes, MD  Cardiologist:  [name] Primary Electrophysiologist:  Constance Haw, MD    No chief complaint on file.    History of Present Illness: Chelsea Hancock is a 87 y.o. female who presents today for electrophysiology evaluation.     She has a history of atrial fibrillation and atrial flutter.  She is status post ablation x2, most recently 08/15/2016.  She is currently on amiodarone and Toprol-XL.  She is continued to have episodes of atrial fibrillation and has now become likely permanent.  She has a Medtronic dual-chamber pacemaker which was implanted for tachybradycardia syndrome.  Post procedure, she developed a pericardial effusion which required hospitalization.  Effusion has been stable.  She has failed cardioversion and feels weak, fatigued, short of breath.  Her daughter also states that she is having issues with her ADLs.  At this point, they would like for AV node ablation to be performed.  Today, denies symptoms of palpitations, chest pain, orthopnea, PND, lower extremity edema, claudication, dizziness, presyncope, syncope, bleeding, or neurologic sequela. The patient is tolerating medications without difficulties.    Past Medical History:  Diagnosis Date  . Atrial fibrillation and flutter ([place])    a. s/p PVI ablation in 07/2015 with continue PAF  b. s/p repeat ablation on 08/15/16. continued on Flecainide 100mg  BID  . Carotid bruit    Right  . Chronic back pain   . Depression with anxiety 03/29/2013  . Diverticulosis   . Fibromyalgia   . Foot drop, right   . GERD (gastroesophageal reflux disease)   . Hemorrhoids   . HTN (hypertension)   . Hyperlipidemia   . IBS (irritable bowel syndrome)   . Memory loss 02/11/2015  . Onychomycosis 07/03/2013  . Osteoporosis   . Sigmoid colon ulcer 03/23/2012   Past Surgical History:   Procedure Laterality Date  . ABDOMINAL HYSTERECTOMY  1981  . BACK SURGERY  1997   reconstructive lower back surgery  . CARDIOVERSION N/A 07/01/2016   Procedure: CARDIOVERSION;  Surgeon: Thayer Headings, MD;  Location: [place];  Service: Cardiovascular;  Laterality: N/A;  . CARDIOVERSION N/A 10/22/2017   Procedure: CARDIOVERSION;  Surgeon: Pixie Casino, MD;  Location: [facility] ENDOSCOPY;  Service: Cardiovascular;  Laterality: N/A;  . CARDIOVERSION N/A 07/19/2020   Procedure: CARDIOVERSION;  Surgeon: Freada Bergeron, MD;  Location: [facility] ENDOSCOPY;  Service: Cardiovascular;  Laterality: N/A;  . CARDIOVERSION N/A 10/23/2020   Procedure: CARDIOVERSION;  Surgeon: Buford Dresser, MD;  Location: [facility] ENDOSCOPY;  Service: Cardiovascular;  Laterality: N/A;  . CARPAL TUNNEL RELEASE Left 2012   ulnar nerve release at elbow  . CERVICAL FUSION  2003, 2005, 2013  . COLONOSCOPY    . ELECTROPHYSIOLOGIC STUDY N/A 08/10/2015   Procedure: Afib;  Surgeon: Livvy Spilman Meredith Leeds, MD;  Location: [place] CV LAB;  Service: Cardiovascular;  Laterality: N/A;  . ELECTROPHYSIOLOGIC STUDY N/A 08/15/2016   Procedure: Atrial Fibrillation Ablation;  Surgeon: Shellyann Wandrey Meredith Leeds, MD;  Location: [place] CV LAB;  Service: Cardiovascular;  Laterality: N/A;  . EYE SURGERY  2015   b/l cataracts removed  . left elbow surgery  06/03/11   cyst removed  . LUMBAR LAMINECTOMY  1960   x 2  . PACEMAKER IMPLANT N/A 09/19/2020   Procedure: PACEMAKER IMPLANT;  Surgeon: Constance Haw, MD;  Location: [facility] CV LAB;  Service: Cardiovascular;  Laterality: N/A;  . POSTERIOR CERVICAL FUSION/FORAMINOTOMY  08/03/2012   Procedure: POSTERIOR CERVICAL FUSION/FORAMINOTOMY LEVEL 5;  Surgeon: [name], MD;  Location: [place] NEURO ORS;  Service: Neurosurgery;  Laterality: N/A;  Cervical four to Thoracic two Posterior cervical decompression with Facet and Pedicle screw fixation  . TEE WITHOUT CARDIOVERSION N/A 10/22/2017    Procedure: TRANSESOPHAGEAL ECHOCARDIOGRAM (TEE);  Surgeon: Pixie Casino, MD;  Location: [facility] ENDOSCOPY;  Service: Cardiovascular;  Laterality: N/A;  . TONSILLECTOMY  1943  . TOTAL HIP ARTHROPLASTY Left 2012  . TOTAL KNEE ARTHROPLASTY Right 11/20/2014   Procedure: RIGHT TOTAL KNEE ARTHROPLASTY;  Surgeon: [name], MD;  Location: [facility] ORS;  Service: Orthopedics;  Laterality: Right;  . veins stripped  1970  . WRIST SURGERY Right 1990's   cyst removed     Current Outpatient Medications  Medication Sig Dispense Refill  . amiodarone (PACERONE) 200 MG tablet Take 1 tablet (200 mg total) by mouth 2 (two) times daily. 60 tablet 1  . atorvastatin (LIPITOR) 10 MG tablet Take 10 mg by mouth daily.    Marland Kitchen CALCIUM+D3 600-20 MG-MCG TABS Take 1 tablet by mouth at bedtime.    . clobetasol (TEMOVATE) 0.05 % GEL Apply 1 application topically 2 (two) times daily as needed (skin irritation.). APPLY TO THE AFFECTED AREA OF SKIN ON ARMS TWICE DAILY  0  . colchicine 0.6 MG tablet Take 0.6 mg by mouth daily.    Marland Kitchen denosumab (PROLIA) 60 MG/ML SOSY injection Inject 60 mg into the skin every 6 (six) months.    Marland Kitchen ELIQUIS 5 MG TABS tablet Take 1 tablet by mouth twice daily. 60 tablet 3  . esomeprazole (NEXIUM) 40 MG capsule TAKE ONE CAPSULE BY MOUTH EVERY DAY AT 12 NOON 90 capsule 2  . ezetimibe (ZETIA) 10 MG tablet TAKE 10 MG BY MOUTH AT BEDTIME 30 tablet 6  . ferrous sulfate 325 (65 FE) MG tablet Take 325 mg by mouth daily with breakfast.    . gabapentin (NEURONTIN) 600 MG tablet TAKE 1 TABLET(600 MG) BY MOUTH TWICE DAILY 180 tablet 0  . ibuprofen (ADVIL) 600 MG tablet Take 600 mg by mouth every 6 (six) hours as needed for moderate pain.    Marland Kitchen levothyroxine (SYNTHROID) 25 MCG tablet Take 25 mcg by mouth daily before breakfast.    . Melatonin 10 MG TABS Take 10 mg by mouth at bedtime.    . metoprolol succinate (TOPROL-XL) 50 MG 24 hr tablet Take 1 tablet (50 mg total) by mouth at bedtime. Take with or immediately  following a meal. 30 tablet 3  . Multiple Vitamin (MULTIVITAMIN WITH MINERALS) TABS tablet Take 1 tablet by mouth daily. Centrum Silver for Women    . nortriptyline (PAMELOR) 50 MG capsule Take 100 mg by mouth at bedtime.    Vladimir Faster Glycol-Propyl Glycol (SYSTANE OP) Place 1 drop into both eyes 4 (four) times daily as needed (dry/irritated eyes.).      No current facility-administered medications for this visit.    Allergies:   Cefuroxime axetil, Macrodantin, Alendronate sodium, Ciprofibrate, Codeine, Demerol [meperidine], Nitrofurantoin, Other, Penicillins, Pravastatin, and Sulfonamide derivatives   Social History:  The patient  reports that she has quit smoking. Her smoking use included cigarettes. She has a 1.00 pack-year smoking history. She has never used smokeless tobacco. She reports current alcohol use. She reports that she does not use drugs.   Family History:  The patient's family history includes Anxiety disorder in her mother; Bipolar disorder in her mother;  Colon cancer in her father; HIV/AIDS in her son; Healthy in her daughter; Heart disease in her maternal grandmother; Mental illness in her mother; Stomach cancer in her maternal grandfather, mother, and paternal grandmother; Stroke in her father; Throat cancer in her maternal uncle.   ROS:  Please see the history of present illness.   Otherwise, review of systems is positive for none.   All other systems are reviewed and negative.   PHYSICAL EXAM: VS:  BP 110/70   Pulse 99   Ht 5\' 2"  (1.575 m)   Wt 153 lb 9.6 oz (69.7 kg)   SpO2 93%   BMI 28.09 kg/m  , BMI Body mass index is 28.09 kg/m. GEN: Well nourished, well developed, in no acute distress  HEENT: normal  Neck: no JVD, carotid bruits, or masses Cardiac: irregular; no murmurs, rubs, or gallops,no edema  Respiratory:  clear to auscultation bilaterally, normal work of breathing GI: soft, nontender, nondistended, + BS MS: no deformity or atrophy  Skin: warm and dry,  device site well healed Neuro:  Strength and sensation are intact Psych: euthymic mood, full affect  EKG:  EKG is not ordered today. Personal review of the ekg ordered 10/30/20 shows atrial flutter, rate 111  Personal review of the device interrogation today. Results in [place]: 07/04/2020: ALT 27 10/10/2020: BUN 12; Creatinine, Ser 0.71; Hemoglobin 11.3; Platelets 421; Potassium 3.8; Sodium 137    Lipid Panel     Component Value Date/Time   CHOL 161 04/16/2019 0352   TRIG 120 04/16/2019 0352   TRIG 133 06/30/2006 1110   HDL 53 04/16/2019 0352   CHOLHDL 3.0 04/16/2019 0352   VLDL 24 04/16/2019 0352   LDLCALC 84 04/16/2019 0352   LDLDIRECT 93.3 08/04/2008 0915     Wt Readings from Last 3 Encounters:  11/02/20 153 lb 9.6 oz (69.7 kg)  10/30/20 155 lb 6.4 oz (70.5 kg)  10/23/20 158 lb (71.7 kg)      Other studies Reviewed: Additional studies/ records that were reviewed today include: TEE 10/22/17  Review of the above records today demonstrates:  - Left ventricle: There was mild concentric hypertrophy. Systolic   function was mildly reduced. The estimated ejection fraction was   in the range of 45% to 50%. Diffuse hypokinesis. No evidence of   thrombus. - Aortic valve: No evidence of vegetation. - Mitral valve: There was mild regurgitation. - Left atrium: No evidence of thrombus in the atrial cavity or   appendage. No evidence of thrombus in the atrial cavity or   appendage. - Right atrium: No evidence of thrombus in the atrial cavity or   appendage. 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 This was visualized in 2D and 3D modes. - Tricuspid valve: There was mild regurgitation. - Pulmonic valve: There was trivial  regurgitation.   ASSESSMENT AND PLAN:  1.  Persistent atrial fibrillation: Status post ablation 08/15/2016.  Currently on amiodarone and Eliquis.  High risk medication monitoring performed.  Unfortunately she has had continued episodes of atrial fibrillation.  It appears that she is becoming permanent.  She has failed cardioversions.  At this point, medication options are limited.  She continues to have rapid episodes of atrial fibrillation.  Due to  that, we Shawnn Bouillon plan for AV node ablation.  Risks and benefits were discussed which could bleeding and tamponade.  She understands these risks and is agreed to the procedure.  2.  Hypertension: Currently well controlled  3.  Hyperlipidemia: Continue atorvastatin and Zetia  4.  Tachybradycardia syndrome: Status post Medtronic dual-chamber pacemaker implanted 09/19/2020.  Device functioning appropriately.  No changes.   Current medicines are reviewed at length with the patient today.   The patient does not have concerns regarding her medicines.  The following changes were made today: None  Labs/ tests ordered today include:  Orders Placed This Encounter  Procedures  . Basic metabolic panel  . CBC     Disposition:   FU with Trevell Pariseau 3 months  Signed, Dewie Ahart Meredith Leeds, MD  11/02/2020 11:57 AM     [facility] [address] [place] [address] [phone] (office) [phone] (fax)

## 2020-11-02 NOTE — Patient Instructions (Addendum)
Medication Instructions:  Your physician recommends that you continue on your current medications as directed. Please refer to the Current Medication list given to you today.  *If you need a refill on your cardiac medications before your next appointment, please call your pharmacy*   Lab Work: Pre procedure labs: 11/26/20 If you have labs (blood work) drawn today and your tests are completely normal, you will receive your results only by: Marland Kitchen MyChart Message (if you have MyChart) OR . A paper copy in the mail If you have any lab test that is abnormal or we need to change your treatment, we will call you to review the results.   Testing/Procedures: None ordered   Follow-Up: At [facility], you and your health needs are our priority.  As part of our continuing mission to provide you with exceptional heart care, we have created designated Provider Care Teams.  These Care Teams include your primary Cardiologist (physician) and Advanced Practice Providers (APPs -  Physician Assistants and Nurse Practitioners) who all work together to provide you with the care you need, when you need it.  Your next appointment:   3 month(s) after your AVnode ablation  The format for your next appointment:   In Person  Provider:   Allegra Lai, MD    Thank you for choosing [facility]!!   Trinidad Curet, RN [phone]   Other Instructions     Electrophysiology/Ablation Procedure Instructions   You are scheduled for a(n) AV Node ablation on 11/28/2020 with Dr. Allegra Lai.   1.   Pre procedure testing-             A.  LAB WORK --- On 11/26/20  for your pre procedure blood work.  You do NOT need to be fasting.               B. COVID TEST-- On 11/26/2020 @ 10:00 am - This is a Drive Up Visit at [address] [place], [place] [address].  Someone will direct you to the appropriate testing line. Stay in your car and someone will be with you shortly.   After you are tested please go home and  self quarantine until the day of your procedure.     PROCEDURE DAY: 2. On the day of your procedure 11/28/2020 you will go to [facility] [phone]) at 12:30 pm.  Dennis Bast will go to the main entrance [address] [employer]) and enter where the [employer] are.  Your driver will drop you off and you will head down the hallway to ADMITTING.  You may have one support person come in to the hospital with you.  They will be asked to wait in the waiting room.  It is OK to have someone drop you off and come back when you are ready to be discharged.   3.   You may have a light breakfast the morning of procedure.  Please eat light breakfast by 7:00 am.  Nothing to eat/drink after 7 am   4.   You may take your morning medications with sips of water.  Avoid supplements.   5.  Plan for an overnight stay, but you may be discharged home after your procedure. If you use your phone frequently bring your phone charger, in case you have to stay.  If you are discharged after your procedure you will need someone to drive you home and be with your for 24 hours after your procedure.   6.  You will follow up with Dr. Curt Bears 4 weeks after your procedure.  The office will call to arrange this.   * If you have ANY questions please call the office [phone] and ask for Kyriakos Babler RN or send me a MyChart message   * Occasionally, EP Studies and ablations can become lengthy.  Please make your family aware of this before your procedure starts.  Average time ranges from 2-8 hours for EP studies/ablations.  Your physician will call your family after the procedure with the results.

## 2020-11-07 ENCOUNTER — Telehealth: Payer: Self-pay | Admitting: *Deleted

## 2020-11-07 NOTE — Telephone Encounter (Signed)
-----   Message from Lauralee Evener, [place] sent at 10/16/2020 11:34 AM EST ----- Regarding: RE: sleep study Ok to schedule sleep study. No PA is required. Patient has Medicare. ----- Message ----- From: Juluis Mire, RN Sent: 10/10/2020   1:39 PM EST To: Cv Div Sleep Studies Subject: sleep study                                    Pt needs sleep study for daytime somnolence, snoring and afib per clint fenton Thanks stacy

## 2020-11-07 NOTE — Telephone Encounter (Signed)
Patient is scheduled for lab study on 12/25/20. Patient understands her sleep study will be done at [facility] sleep lab. Patient understands she will receive a sleep packet in a week or so. Patient understands to call if she does not receive the sleep packet in a timely manner. Patient agrees with treatment and thanked me for call Left detailed message on voicemail with date and time of titration and informed patient to call back to confirm or reschedule.

## 2020-11-08 NOTE — Telephone Encounter (Signed)
Patients daughter Alyse Low has cancelled the study for her mother, she is moving her mother out of town.

## 2020-11-14 DIAGNOSIS — F5101 Primary insomnia: Secondary | ICD-10-CM | POA: Diagnosis not present

## 2020-11-14 DIAGNOSIS — F331 Major depressive disorder, recurrent, moderate: Secondary | ICD-10-CM | POA: Diagnosis not present

## 2020-11-14 DIAGNOSIS — G3184 Mild cognitive impairment, so stated: Secondary | ICD-10-CM | POA: Diagnosis not present

## 2020-11-15 DIAGNOSIS — F331 Major depressive disorder, recurrent, moderate: Secondary | ICD-10-CM | POA: Diagnosis not present

## 2020-11-15 DIAGNOSIS — I10 Essential (primary) hypertension: Secondary | ICD-10-CM | POA: Diagnosis not present

## 2020-11-15 DIAGNOSIS — I482 Chronic atrial fibrillation, unspecified: Secondary | ICD-10-CM | POA: Diagnosis not present

## 2020-11-15 DIAGNOSIS — G3184 Mild cognitive impairment, so stated: Secondary | ICD-10-CM | POA: Diagnosis not present

## 2020-11-15 DIAGNOSIS — K219 Gastro-esophageal reflux disease without esophagitis: Secondary | ICD-10-CM | POA: Diagnosis not present

## 2020-11-15 DIAGNOSIS — R531 Weakness: Secondary | ICD-10-CM | POA: Diagnosis not present

## 2020-11-15 DIAGNOSIS — F5104 Psychophysiologic insomnia: Secondary | ICD-10-CM | POA: Diagnosis not present

## 2020-11-15 DIAGNOSIS — I2510 Atherosclerotic heart disease of native coronary artery without angina pectoris: Secondary | ICD-10-CM | POA: Diagnosis not present

## 2020-11-20 NOTE — Telephone Encounter (Signed)
Spoke to dtr, [employer]. Holding a new date of 4/28 for procedure. She will let me know what ortho says, but feels that she should be ok w/i 6-8 weeks. Will follow up a later date for procedure instructions. Dtr is agreeable to plan.

## 2020-11-22 ENCOUNTER — Other Ambulatory Visit: Payer: Self-pay

## 2020-11-22 ENCOUNTER — Ambulatory Visit (INDEPENDENT_AMBULATORY_CARE_PROVIDER_SITE_OTHER): Payer: Medicare Other | Admitting: Podiatry

## 2020-11-22 DIAGNOSIS — B351 Tinea unguium: Secondary | ICD-10-CM | POA: Diagnosis not present

## 2020-11-22 DIAGNOSIS — M79675 Pain in left toe(s): Secondary | ICD-10-CM

## 2020-11-22 DIAGNOSIS — M79674 Pain in right toe(s): Secondary | ICD-10-CM

## 2020-11-22 DIAGNOSIS — Z7901 Long term (current) use of anticoagulants: Secondary | ICD-10-CM

## 2020-11-26 ENCOUNTER — Other Ambulatory Visit (HOSPITAL_COMMUNITY): Payer: Medicare Other

## 2020-11-27 NOTE — Progress Notes (Signed)
Subjective: 87 y.o. returns the office today for painful, elongated, thickened toenails which she cannot trim herself. Denies any redness or drainage around the nails.  She brought in an AFO brace that she has for dropfoot which she has not been wearing it.  Denies any acute changes since last appointment and no new complaints today. Denies any systemic complaints such as fevers, chills, nausea, vomiting.   PCP: Lajean Manes, MD  Objective: AAO 3, NAD DP/PT pulses palpable, CRT less than 3 seconds Nails hypertrophic, dystrophic, elongated, brittle, discolored 10. There is tenderness overlying the nails 1-5 bilaterally. There is no surrounding erythema or drainage along the nail sites.  Incurvation present to right hallux toenail any No open lesions or pre-ulcerative lesions are identified. Dropfoot present No pain with calf compression, swelling, warmth, erythema.  Assessment: Patient presents with symptomatic onychomycosis, ingrown toenail  Plan: -Treatment options including alternatives, risks, complications were discussed -Nails sharply debrided 10 without complication/bleeding. -Evaluate the AFO brace.  Since she is feeling okay.  Recommended to use this on a regular basis. -Discussed daily foot inspection. If there are any changes, to call the office immediately.  -Follow-up in 3 months or sooner if any problems are to arise. In the meantime, encouraged to call the office with any questions, concerns, changes symptoms.  Celesta Gentile, DPM

## 2020-11-29 DIAGNOSIS — D509 Iron deficiency anemia, unspecified: Secondary | ICD-10-CM | POA: Diagnosis not present

## 2020-11-29 DIAGNOSIS — K219 Gastro-esophageal reflux disease without esophagitis: Secondary | ICD-10-CM | POA: Diagnosis not present

## 2020-11-29 DIAGNOSIS — E7800 Pure hypercholesterolemia, unspecified: Secondary | ICD-10-CM | POA: Diagnosis not present

## 2020-11-29 DIAGNOSIS — I480 Paroxysmal atrial fibrillation: Secondary | ICD-10-CM | POA: Diagnosis not present

## 2020-11-29 DIAGNOSIS — E039 Hypothyroidism, unspecified: Secondary | ICD-10-CM | POA: Diagnosis not present

## 2020-11-29 DIAGNOSIS — M810 Age-related osteoporosis without current pathological fracture: Secondary | ICD-10-CM | POA: Diagnosis not present

## 2020-11-29 DIAGNOSIS — H35033 Hypertensive retinopathy, bilateral: Secondary | ICD-10-CM | POA: Diagnosis not present

## 2020-12-05 DIAGNOSIS — G3184 Mild cognitive impairment, so stated: Secondary | ICD-10-CM | POA: Diagnosis not present

## 2020-12-05 DIAGNOSIS — I480 Paroxysmal atrial fibrillation: Secondary | ICD-10-CM | POA: Diagnosis not present

## 2020-12-05 DIAGNOSIS — F411 Generalized anxiety disorder: Secondary | ICD-10-CM | POA: Diagnosis not present

## 2020-12-05 DIAGNOSIS — F331 Major depressive disorder, recurrent, moderate: Secondary | ICD-10-CM | POA: Diagnosis not present

## 2020-12-05 DIAGNOSIS — F5101 Primary insomnia: Secondary | ICD-10-CM | POA: Diagnosis not present

## 2020-12-11 ENCOUNTER — Other Ambulatory Visit: Payer: Self-pay | Admitting: Cardiology

## 2020-12-17 DIAGNOSIS — W0110XA Fall on same level from slipping, tripping and stumbling with subsequent striking against unspecified object, initial encounter: Secondary | ICD-10-CM | POA: Diagnosis not present

## 2020-12-17 DIAGNOSIS — M25551 Pain in right hip: Secondary | ICD-10-CM | POA: Diagnosis not present

## 2020-12-17 DIAGNOSIS — M546 Pain in thoracic spine: Secondary | ICD-10-CM | POA: Diagnosis not present

## 2020-12-17 DIAGNOSIS — R519 Headache, unspecified: Secondary | ICD-10-CM | POA: Diagnosis not present

## 2020-12-17 DIAGNOSIS — S0990XA Unspecified injury of head, initial encounter: Secondary | ICD-10-CM | POA: Diagnosis not present

## 2020-12-19 ENCOUNTER — Ambulatory Visit (INDEPENDENT_AMBULATORY_CARE_PROVIDER_SITE_OTHER): Payer: Medicare Other

## 2020-12-19 DIAGNOSIS — M533 Sacrococcygeal disorders, not elsewhere classified: Secondary | ICD-10-CM | POA: Diagnosis not present

## 2020-12-19 DIAGNOSIS — I495 Sick sinus syndrome: Secondary | ICD-10-CM | POA: Diagnosis not present

## 2020-12-19 DIAGNOSIS — E860 Dehydration: Secondary | ICD-10-CM | POA: Diagnosis not present

## 2020-12-19 DIAGNOSIS — S300XXA Contusion of lower back and pelvis, initial encounter: Secondary | ICD-10-CM | POA: Diagnosis not present

## 2020-12-19 DIAGNOSIS — Z7901 Long term (current) use of anticoagulants: Secondary | ICD-10-CM | POA: Diagnosis not present

## 2020-12-19 DIAGNOSIS — T1490XA Injury, unspecified, initial encounter: Secondary | ICD-10-CM | POA: Diagnosis not present

## 2020-12-19 DIAGNOSIS — S0001XA Abrasion of scalp, initial encounter: Secondary | ICD-10-CM | POA: Diagnosis not present

## 2020-12-19 DIAGNOSIS — S0990XA Unspecified injury of head, initial encounter: Secondary | ICD-10-CM | POA: Diagnosis not present

## 2020-12-19 DIAGNOSIS — Z043 Encounter for examination and observation following other accident: Secondary | ICD-10-CM | POA: Diagnosis not present

## 2020-12-19 DIAGNOSIS — I4891 Unspecified atrial fibrillation: Secondary | ICD-10-CM | POA: Diagnosis not present

## 2020-12-19 DIAGNOSIS — W19XXXA Unspecified fall, initial encounter: Secondary | ICD-10-CM | POA: Diagnosis not present

## 2020-12-19 DIAGNOSIS — R0902 Hypoxemia: Secondary | ICD-10-CM | POA: Diagnosis not present

## 2020-12-19 DIAGNOSIS — M542 Cervicalgia: Secondary | ICD-10-CM | POA: Diagnosis not present

## 2020-12-19 DIAGNOSIS — R079 Chest pain, unspecified: Secondary | ICD-10-CM | POA: Diagnosis not present

## 2020-12-19 LAB — CUP PACEART REMOTE DEVICE CHECK
Battery Remaining Longevity: 178 mo
Battery Voltage: 3.21 V
Brady Statistic RA Percent Paced: 0.2 %
Brady Statistic RV Percent Paced: 7.86 %
Date Time Interrogation Session: 20220330071719
Implantable Lead Implant Date: 20211229
Implantable Lead Implant Date: 20211229
Implantable Lead Location: 753859
Implantable Lead Location: 753860
Implantable Lead Model: 5076
Implantable Lead Model: 5076
Implantable Pulse Generator Implant Date: 20211229
Lead Channel Impedance Value: 304 Ohm
Lead Channel Impedance Value: 437 Ohm
Lead Channel Impedance Value: 665 Ohm
Lead Channel Impedance Value: 760 Ohm
Lead Channel Pacing Threshold Amplitude: 0.875 V
Lead Channel Pacing Threshold Pulse Width: 0.4 ms
Lead Channel Sensing Intrinsic Amplitude: 0.5 mV
Lead Channel Sensing Intrinsic Amplitude: 0.5 mV
Lead Channel Sensing Intrinsic Amplitude: 22.25 mV
Lead Channel Sensing Intrinsic Amplitude: 22.25 mV
Lead Channel Setting Pacing Amplitude: 2 V
Lead Channel Setting Pacing Amplitude: 3.5 V
Lead Channel Setting Pacing Pulse Width: 0.4 ms
Lead Channel Setting Sensing Sensitivity: 1.2 mV

## 2020-12-20 DIAGNOSIS — I480 Paroxysmal atrial fibrillation: Secondary | ICD-10-CM | POA: Diagnosis not present

## 2020-12-20 DIAGNOSIS — I10 Essential (primary) hypertension: Secondary | ICD-10-CM | POA: Diagnosis not present

## 2020-12-20 DIAGNOSIS — E7800 Pure hypercholesterolemia, unspecified: Secondary | ICD-10-CM | POA: Diagnosis not present

## 2020-12-20 DIAGNOSIS — I482 Chronic atrial fibrillation, unspecified: Secondary | ICD-10-CM | POA: Diagnosis not present

## 2020-12-20 DIAGNOSIS — Z7901 Long term (current) use of anticoagulants: Secondary | ICD-10-CM | POA: Diagnosis not present

## 2020-12-20 DIAGNOSIS — I2510 Atherosclerotic heart disease of native coronary artery without angina pectoris: Secondary | ICD-10-CM | POA: Diagnosis not present

## 2020-12-20 DIAGNOSIS — Z9181 History of falling: Secondary | ICD-10-CM | POA: Diagnosis not present

## 2020-12-20 DIAGNOSIS — F331 Major depressive disorder, recurrent, moderate: Secondary | ICD-10-CM | POA: Diagnosis not present

## 2020-12-20 DIAGNOSIS — E039 Hypothyroidism, unspecified: Secondary | ICD-10-CM | POA: Diagnosis not present

## 2020-12-20 DIAGNOSIS — K219 Gastro-esophageal reflux disease without esophagitis: Secondary | ICD-10-CM | POA: Diagnosis not present

## 2020-12-20 DIAGNOSIS — G3184 Mild cognitive impairment, so stated: Secondary | ICD-10-CM | POA: Diagnosis not present

## 2020-12-20 DIAGNOSIS — Z950 Presence of cardiac pacemaker: Secondary | ICD-10-CM | POA: Diagnosis not present

## 2020-12-20 DIAGNOSIS — F5104 Psychophysiologic insomnia: Secondary | ICD-10-CM | POA: Diagnosis not present

## 2020-12-24 DIAGNOSIS — E039 Hypothyroidism, unspecified: Secondary | ICD-10-CM | POA: Diagnosis not present

## 2020-12-24 DIAGNOSIS — I2510 Atherosclerotic heart disease of native coronary artery without angina pectoris: Secondary | ICD-10-CM | POA: Diagnosis not present

## 2020-12-24 DIAGNOSIS — F5104 Psychophysiologic insomnia: Secondary | ICD-10-CM | POA: Diagnosis not present

## 2020-12-24 DIAGNOSIS — K219 Gastro-esophageal reflux disease without esophagitis: Secondary | ICD-10-CM | POA: Diagnosis not present

## 2020-12-24 DIAGNOSIS — I482 Chronic atrial fibrillation, unspecified: Secondary | ICD-10-CM | POA: Diagnosis not present

## 2020-12-24 DIAGNOSIS — I10 Essential (primary) hypertension: Secondary | ICD-10-CM | POA: Diagnosis not present

## 2020-12-25 ENCOUNTER — Encounter (HOSPITAL_BASED_OUTPATIENT_CLINIC_OR_DEPARTMENT_OTHER): Payer: Medicare Other | Admitting: Cardiology

## 2020-12-25 DIAGNOSIS — R531 Weakness: Secondary | ICD-10-CM | POA: Diagnosis not present

## 2020-12-25 DIAGNOSIS — I10 Essential (primary) hypertension: Secondary | ICD-10-CM | POA: Diagnosis not present

## 2020-12-25 DIAGNOSIS — R2681 Unsteadiness on feet: Secondary | ICD-10-CM | POA: Diagnosis not present

## 2020-12-25 DIAGNOSIS — I482 Chronic atrial fibrillation, unspecified: Secondary | ICD-10-CM | POA: Diagnosis not present

## 2020-12-25 DIAGNOSIS — K219 Gastro-esophageal reflux disease without esophagitis: Secondary | ICD-10-CM | POA: Diagnosis not present

## 2020-12-25 DIAGNOSIS — E039 Hypothyroidism, unspecified: Secondary | ICD-10-CM | POA: Diagnosis not present

## 2020-12-25 DIAGNOSIS — F5104 Psychophysiologic insomnia: Secondary | ICD-10-CM | POA: Diagnosis not present

## 2020-12-25 DIAGNOSIS — I2510 Atherosclerotic heart disease of native coronary artery without angina pectoris: Secondary | ICD-10-CM | POA: Diagnosis not present

## 2020-12-25 DIAGNOSIS — W19XXXA Unspecified fall, initial encounter: Secondary | ICD-10-CM | POA: Diagnosis not present

## 2020-12-26 DIAGNOSIS — K219 Gastro-esophageal reflux disease without esophagitis: Secondary | ICD-10-CM | POA: Diagnosis not present

## 2020-12-26 DIAGNOSIS — I10 Essential (primary) hypertension: Secondary | ICD-10-CM | POA: Diagnosis not present

## 2020-12-26 DIAGNOSIS — F5104 Psychophysiologic insomnia: Secondary | ICD-10-CM | POA: Diagnosis not present

## 2020-12-26 DIAGNOSIS — I482 Chronic atrial fibrillation, unspecified: Secondary | ICD-10-CM | POA: Diagnosis not present

## 2020-12-26 DIAGNOSIS — E039 Hypothyroidism, unspecified: Secondary | ICD-10-CM | POA: Diagnosis not present

## 2020-12-26 DIAGNOSIS — I2510 Atherosclerotic heart disease of native coronary artery without angina pectoris: Secondary | ICD-10-CM | POA: Diagnosis not present

## 2020-12-27 ENCOUNTER — Ambulatory Visit (INDEPENDENT_AMBULATORY_CARE_PROVIDER_SITE_OTHER): Payer: Medicare Other | Admitting: Cardiology

## 2020-12-27 ENCOUNTER — Encounter: Payer: Self-pay | Admitting: Cardiology

## 2020-12-27 ENCOUNTER — Other Ambulatory Visit: Payer: Self-pay

## 2020-12-27 ENCOUNTER — Encounter: Payer: Self-pay | Admitting: *Deleted

## 2020-12-27 VITALS — BP 110/68 | HR 92 | Ht 62.0 in | Wt 156.4 lb

## 2020-12-27 DIAGNOSIS — I10 Essential (primary) hypertension: Secondary | ICD-10-CM | POA: Diagnosis not present

## 2020-12-27 DIAGNOSIS — I495 Sick sinus syndrome: Secondary | ICD-10-CM

## 2020-12-27 DIAGNOSIS — E785 Hyperlipidemia, unspecified: Secondary | ICD-10-CM

## 2020-12-27 DIAGNOSIS — I4819 Other persistent atrial fibrillation: Secondary | ICD-10-CM | POA: Diagnosis not present

## 2020-12-27 NOTE — Progress Notes (Signed)
Electrophysiology Office Note   Date:  12/27/2020   ID:  Chelsea Hancock, DOB 02-02-1934, MRN [id number]  PCP:  Lajean Manes, MD  Cardiologist:  [name] Primary Electrophysiologist:  Constance Haw, MD    No chief complaint on file.    History of Present Illness: Chelsea Hancock is a 87 y.o. female who presents today for electrophysiology evaluation.     She has a history significant for atrial fibrillation and atrial flutter.  She is status post ablation x2, most recently on 08/15/2016.  She is currently on Toprol-XL.  She has had more frequent episodes of atrial fibrillation and appears to have become permanent.  She has a Medtronic dual-chamber pacemaker that was implanted for tachybradycardia syndrome.  Post procedure, she did develop a pericardial effusion that required hospitalization.  She has failed multiple cardioversions.  She has had symptoms of weakness, fatigue, and shortness of breath.  Today, denies symptoms of palpitations, chest pain, shortness of breath, orthopnea, PND, lower extremity edema, claudication, dizziness, presyncope, syncope, bleeding, or neurologic sequela. The patient is tolerating medications without difficulties.  She continues to have weakness and fatigue.  She also has some baseline shortness of breath.  She has had 2 falls, and has had CT scans of the head after both of them.  Both CTs were negative.  The falls were all mechanical.  Past Medical History:  Diagnosis Date  . Atrial fibrillation and flutter ([place])    a. s/p PVI ablation in 07/2015 with continue PAF  b. s/p repeat ablation on 08/15/16. continued on Flecainide 100mg  BID  . Carotid bruit    Right  . Chronic back pain   . Depression with anxiety 03/29/2013  . Diverticulosis   . Fibromyalgia   . Foot drop, right   . GERD (gastroesophageal reflux disease)   . Hemorrhoids   . HTN (hypertension)   . Hyperlipidemia   . IBS (irritable bowel syndrome)   . Memory loss  02/11/2015  . Onychomycosis 07/03/2013  . Osteoporosis   . Sigmoid colon ulcer 03/23/2012   Past Surgical History:  Procedure Laterality Date  . ABDOMINAL HYSTERECTOMY  1981  . BACK SURGERY  1997   reconstructive lower back surgery  . CARDIOVERSION N/A 07/01/2016   Procedure: CARDIOVERSION;  Surgeon: Thayer Headings, MD;  Location: [facility];  Service: Cardiovascular;  Laterality: N/A;  . CARDIOVERSION N/A 10/22/2017   Procedure: CARDIOVERSION;  Surgeon: Pixie Casino, MD;  Location: [facility] ENDOSCOPY;  Service: Cardiovascular;  Laterality: N/A;  . CARDIOVERSION N/A 07/19/2020   Procedure: CARDIOVERSION;  Surgeon: Freada Bergeron, MD;  Location: [facility] ENDOSCOPY;  Service: Cardiovascular;  Laterality: N/A;  . CARDIOVERSION N/A 10/23/2020   Procedure: CARDIOVERSION;  Surgeon: Buford Dresser, MD;  Location: [facility] ENDOSCOPY;  Service: Cardiovascular;  Laterality: N/A;  . CARPAL TUNNEL RELEASE Left 2012   ulnar nerve release at elbow  . CERVICAL FUSION  2003, 2005, 2013  . COLONOSCOPY    . ELECTROPHYSIOLOGIC STUDY N/A 08/10/2015   Procedure: Afib;  Surgeon: Maleki Hippe Meredith Leeds, MD;  Location: [facility] CV LAB;  Service: Cardiovascular;  Laterality: N/A;  . ELECTROPHYSIOLOGIC STUDY N/A 08/15/2016   Procedure: Atrial Fibrillation Ablation;  Surgeon: Brighten Buzzelli Meredith Leeds, MD;  Location: [place] CV LAB;  Service: Cardiovascular;  Laterality: N/A;  . EYE SURGERY  2015   b/l cataracts removed  . left elbow surgery  06/03/11   cyst removed  . LUMBAR LAMINECTOMY  1960   x 2  . PACEMAKER IMPLANT  N/A 09/19/2020   Procedure: PACEMAKER IMPLANT;  Surgeon: Constance Haw, MD;  Location: [place] CV LAB;  Service: Cardiovascular;  Laterality: N/A;  . POSTERIOR CERVICAL FUSION/FORAMINOTOMY  08/03/2012   Procedure: POSTERIOR CERVICAL FUSION/FORAMINOTOMY LEVEL 5;  Surgeon: [name], MD;  Location: [place] NEURO ORS;  Service: Neurosurgery;  Laterality: N/A;  Cervical four to Thoracic two  Posterior cervical decompression with Facet and Pedicle screw fixation  . TEE WITHOUT CARDIOVERSION N/A 10/22/2017   Procedure: TRANSESOPHAGEAL ECHOCARDIOGRAM (TEE);  Surgeon: Pixie Casino, MD;  Location: [facility] ENDOSCOPY;  Service: Cardiovascular;  Laterality: N/A;  . TONSILLECTOMY  1943  . TOTAL HIP ARTHROPLASTY Left 2012  . TOTAL KNEE ARTHROPLASTY Right 11/20/2014   Procedure: RIGHT TOTAL KNEE ARTHROPLASTY;  Surgeon: [name], MD;  Location: [facility] ORS;  Service: Orthopedics;  Laterality: Right;  . veins stripped  1970  . WRIST SURGERY Right 1990's   cyst removed     Current Outpatient Medications  Medication Sig Dispense Refill  . atorvastatin (LIPITOR) 10 MG tablet Take 10 mg by mouth daily.    Marland Kitchen CALCIUM+D3 600-20 MG-MCG TABS Take 1 tablet by mouth at bedtime.    . clobetasol (TEMOVATE) 0.05 % GEL Apply 1 application topically 2 (two) times daily as needed (skin irritation.). APPLY TO THE AFFECTED AREA OF SKIN ON ARMS TWICE DAILY  0  . denosumab (PROLIA) 60 MG/ML SOSY injection Inject 60 mg into the skin every 6 (six) months.    Marland Kitchen ELIQUIS 5 MG TABS tablet Take 1 tablet by mouth twice daily. 60 tablet 3  . esomeprazole (NEXIUM) 40 MG capsule TAKE ONE CAPSULE BY MOUTH EVERY DAY AT 12 NOON 90 capsule 2  . ezetimibe (ZETIA) 10 MG tablet TAKE 10 MG BY MOUTH AT BEDTIME 30 tablet 6  . ferrous sulfate 325 (65 FE) MG tablet Take 325 mg by mouth daily with breakfast.    . gabapentin (NEURONTIN) 600 MG tablet TAKE 1 TABLET(600 MG) BY MOUTH TWICE DAILY 180 tablet 0  . levothyroxine (SYNTHROID) 25 MCG tablet Take 25 mcg by mouth daily before breakfast.    . Melatonin 10 MG TABS Take 10 mg by mouth at bedtime.    . metoprolol succinate (TOPROL-XL) 50 MG 24 hr tablet Take 1 tablet (50 mg total) by mouth at bedtime. Take with or immediately following a meal. 30 tablet 3  . Multiple Vitamin (MULTIVITAMIN WITH MINERALS) TABS tablet Take 1 tablet by mouth daily. Centrum Silver for Women    .  nortriptyline (PAMELOR) 50 MG capsule Take 100 mg by mouth at bedtime.    Vladimir Faster Glycol-Propyl Glycol (SYSTANE OP) Place 1 drop into both eyes 4 (four) times daily as needed (dry/irritated eyes.).      No current facility-administered medications for this visit.    Allergies:   Cefuroxime axetil, Macrodantin, Alendronate sodium, Ciprofibrate, Codeine, Demerol [meperidine], Nitrofurantoin, Other, Penicillins, Pravastatin, and Sulfonamide derivatives   Social History:  The patient  reports that she has quit smoking. Her smoking use included cigarettes. She has a 1.00 pack-year smoking history. She has never used smokeless tobacco. She reports current alcohol use. She reports that she does not use drugs.   Family History:  The patient's family history includes Anxiety disorder in her mother; Bipolar disorder in her mother; Colon cancer in her father; HIV/AIDS in her son; Healthy in her daughter; Heart disease in her maternal grandmother; Mental illness in her mother; Stomach cancer in her maternal grandfather, mother, and paternal grandmother; Stroke in  her father; Throat cancer in her maternal uncle.   ROS:  Please see the history of present illness.   Otherwise, review of systems is positive for none.   All other systems are reviewed and negative.   PHYSICAL EXAM: VS:  BP 110/68   Pulse 92   Ht 5\' 2"  (1.575 m)   Wt 156 lb 6.4 oz (70.9 kg)   SpO2 93%   BMI 28.61 kg/m  , BMI Body mass index is 28.61 kg/m. GEN: Well nourished, well developed, in no acute distress  HEENT: normal  Neck: no JVD, carotid bruits, or masses Cardiac: irregular; no murmurs, rubs, or gallops,no edema  Respiratory:  clear to auscultation bilaterally, normal work of breathing GI: soft, nontender, nondistended, + BS MS: no deformity or atrophy  Skin: warm and dry, device site well healed Neuro:  Strength and sensation are intact Psych: euthymic mood, full affect  EKG:  EKG is ordered today. Personal review of  the ekg ordered shows atrial fibrillation, intermittent ventricular paced  Personal review of the device interrogation today. Results in [place]: 07/04/2020: ALT 27 10/10/2020: BUN 12; Creatinine, Ser 0.71; Hemoglobin 11.3; Platelets 421; Potassium 3.8; Sodium 137    Lipid Panel     Component Value Date/Time   CHOL 161 04/16/2019 0352   TRIG 120 04/16/2019 0352   TRIG 133 06/30/2006 1110   HDL 53 04/16/2019 0352   CHOLHDL 3.0 04/16/2019 0352   VLDL 24 04/16/2019 0352   LDLCALC 84 04/16/2019 0352   LDLDIRECT 93.3 08/04/2008 0915     Wt Readings from Last 3 Encounters:  12/27/20 156 lb 6.4 oz (70.9 kg)  11/02/20 153 lb 9.6 oz (69.7 kg)  10/30/20 155 lb 6.4 oz (70.5 kg)      Other studies Reviewed: Additional studies/ records that were reviewed today include: TEE 10/22/17  Review of the above records today demonstrates:  - Left ventricle: There was mild concentric hypertrophy. Systolic   function was mildly reduced. The estimated ejection fraction was   in the range of 45% to 50%. Diffuse hypokinesis. No evidence of   thrombus. - Aortic valve: No evidence of vegetation. - Mitral valve: There was mild regurgitation. - Left atrium: No evidence of thrombus in the atrial cavity or   appendage. No evidence of thrombus in the atrial cavity or   appendage. - Right atrium: No evidence of thrombus in the atrial cavity or   appendage. 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 This was visualized in 2D and 3D modes. - Tricuspid valve: There was mild regurgitation. - Pulmonic valve: There was trivial regurgitation.   ASSESSMENT AND PLAN:  1.  Persistent atrial fibrillation: Status post ablation 08/15/2016.  Currently on amiodarone and Eliquis.   High risk medication monitoring. She has failed multiple cardioversions at this point.  We Dakota Stangl plan for AV node ablation.  Risk and benefits of been discussed include bleeding, tamponade, stroke.  The patient understands these risks and is agreed to the procedure.  We Zigmund Linse stop amiodarone today  2.  Hypertension: Currently well controlled  3.  Hyperlipidemia: Continue atorvastatin and Zetia  4.  Tachybradycardia syndrome: Status post Medtronic dual-chamber pacemaker implanted 09/19/2020.  Device functioning appropriately.  No changes at  this time.    Current medicines are reviewed at length with the patient today.   The patient does not have concerns regarding her medicines.  The following changes were made today: Stop amiodarone  Labs/ tests ordered today include:  Orders Placed This Encounter  Procedures  . EKG 12-Lead     Disposition:   FU with Mckynlie Vanderslice 3 months  Signed, Bertrand Vowels Meredith Leeds, MD  12/27/2020 2:41 PM     [address] [place] [address] [phone] (office) [phone] (fax)

## 2020-12-27 NOTE — Patient Instructions (Addendum)
Medication Instructions:  Stop you amiodarone  Your physician recommends that you continue on your current medications as directed. Please refer to the Current Medication list given to you today.  Labwork: None ordered.  Testing/Procedures: Your physician has recommended that you have an ablation. Catheter ablation is a medical procedure used to treat some cardiac arrhythmias (irregular heartbeats). During catheter ablation, a long, thin, flexible tube is put into a blood vessel in your groin (upper thigh), or neck. This tube is called an ablation catheter. It is then guided to your heart through the blood vessel. Radio frequency waves destroy small areas of heart tissue where abnormal heartbeats may cause an arrhythmia to start. Please see the instruction sheet given to you today.   Remote monitoring is used to monitor your Pacemaker from home. This monitoring reduces the number of office visits required to check your device to one time per year. It allows [place] to keep an eye on the functioning of your device to ensure it is working properly. You are scheduled for a device check from home on 03/20/21. You may send your transmission at any time that day. If you have a wireless device, the transmission will be sent automatically. After your physician reviews your transmission, you will receive a postcard with your next transmission date.  Any Other Special Instructions Will Be Listed Below (If Applicable).  If you need a refill on your cardiac medications before your next appointment, please call your pharmacy.    Cardiac Ablation Cardiac ablation is a procedure to destroy (ablate) some heart tissue that is sending bad signals. These bad signals cause problems in heart rhythm. The heart has many areas that make these signals. If there are problems in these areas, they can make the heart beat in a way that is not normal. Destroying some tissues can help make the heart rhythm normal. Tell your doctor  about:  Any allergies you have.  All medicines you are taking. These include vitamins, herbs, eye drops, creams, and over-the-counter medicines.  Any problems you or family members have had with medicines that make you fall asleep (anesthetics).  Any blood disorders you have.  Any surgeries you have had.  Any medical conditions you have, such as kidney failure.  Whether you are pregnant or may be pregnant. What are the risks? This is a safe procedure. But problems may occur, including:  Infection.  Bruising and bleeding.  Bleeding into the chest.  Stroke or blood clots.  Damage to nearby areas of your body.  Allergies to medicines or dyes.  The need for a pacemaker if the normal system is damaged.  Failure of the procedure to treat the problem. What happens before the procedure? Medicines Ask your doctor about:  Changing or stopping your normal medicines. This is important.  Taking aspirin and ibuprofen. Do not take these medicines unless your doctor tells you to take them.  Taking other medicines, vitamins, herbs, and supplements. General instructions  Follow instructions from your doctor about what you cannot eat or drink.  Plan to have someone take you home from the hospital or clinic.  If you will be going home right after the procedure, plan to have someone with you for 24 hours.  Ask your doctor what steps will be taken to prevent infection. What happens during the procedure?  An IV tube will be put into one of your veins.  You will be given a medicine to help you relax.  The skin on your neck or  groin will be numbed.  A cut (incision) will be made in your neck or groin. A needle will be put through your cut and into a large vein.  A tube (catheter) will be put into the needle. The tube will be moved to your heart.  Dye may be put through the tube. This helps your doctor see your heart.  Small devices (electrodes) on the tube will send out  signals.  A type of energy will be used to destroy some heart tissue.  The tube will be taken out.  Pressure will be held on your cut. This helps stop bleeding.  A bandage will be put over your cut. The exact procedure may vary among doctors and hospitals.   What happens after the procedure?  You will be watched until you leave the hospital or clinic. This includes checking your heart rate, breathing rate, oxygen, and blood pressure.  Your cut will be watched for bleeding. You will need to lie still for a few hours.  Do not drive for 24 hours or as long as your doctor tells you. Summary  Cardiac ablation is a procedure to destroy some heart tissue. This is done to treat heart rhythm problems.  Tell your doctor about any medical conditions you may have. Tell him or her about all medicines you are taking to treat them.  This is a safe procedure. But problems may occur. These include infection, bruising, bleeding, and damage to nearby areas of your body.  Follow what your doctor tells you about food and drink. You may also be told to change or stop some of your medicines.  After the procedure, do not drive for 24 hours or as long as your doctor tells you. This information is not intended to replace advice given to you by your health care provider. Make sure you discuss any questions you have with your health care provider. Document Revised: 08/11/2019 Document Reviewed: 08/11/2019 Elsevier Patient Education  2021 [employer].

## 2020-12-27 NOTE — H&P (View-Only) (Signed)
Electrophysiology Office Note   Date:  12/27/2020   ID:  Chelsea Hancock, DOB [birth year], MRN [id number]  PCP:  Chelsea Manes, MD  Cardiologist:  Chelsea Hancock Primary Electrophysiologist:  Chelsea Haw, MD    No chief complaint on file.    History of Present Illness: Chelsea Hancock is a 87 y.o. female who presents today for electrophysiology evaluation.     She has a history significant for atrial fibrillation and atrial flutter.  She is status post ablation x2, most recently on 08/15/2016.  She is currently on Toprol-XL.  She has had more frequent episodes of atrial fibrillation and appears to have become permanent.  She has a Medtronic dual-chamber pacemaker that was implanted for tachybradycardia syndrome.  Post procedure, she did develop a pericardial effusion that required hospitalization.  She has failed multiple cardioversions.  She has had symptoms of weakness, fatigue, and shortness of breath.  Today, denies symptoms of palpitations, chest pain, shortness of breath, orthopnea, PND, lower extremity edema, claudication, dizziness, presyncope, syncope, bleeding, or neurologic sequela. The patient is tolerating medications without difficulties.  She continues to have weakness and fatigue.  She also has some baseline shortness of breath.  She has had 2 falls, and has had CT scans of the head after both of them.  Both CTs were negative.  The falls were all mechanical.  Past Medical History:  Diagnosis Date  . Atrial fibrillation and flutter ([place])    a. s/p PVI ablation in 07/2015 with continue PAF  b. s/p repeat ablation on 08/15/16. continued on Flecainide 100mg  BID  . Carotid bruit    Right  . Chronic back pain   . Depression with anxiety 03/29/2013  . Diverticulosis   . Fibromyalgia   . Foot drop, right   . GERD (gastroesophageal reflux disease)   . Hemorrhoids   . HTN (hypertension)   . Hyperlipidemia   . IBS (irritable bowel syndrome)   . Memory loss  02/11/2015  . Onychomycosis 07/03/2013  . Osteoporosis   . Sigmoid colon ulcer 03/23/2012   Past Surgical History:  Procedure Laterality Date  . ABDOMINAL HYSTERECTOMY  1981  . BACK SURGERY  1997   reconstructive lower back surgery  . CARDIOVERSION N/A 07/01/2016   Procedure: CARDIOVERSION;  Surgeon: Chelsea Headings, MD;  Location: [place];  Service: Cardiovascular;  Laterality: N/A;  . CARDIOVERSION N/A 10/22/2017   Procedure: CARDIOVERSION;  Surgeon: Chelsea Casino, MD;  Location: [facility] ENDOSCOPY;  Service: Cardiovascular;  Laterality: N/A;  . CARDIOVERSION N/A 07/19/2020   Procedure: CARDIOVERSION;  Surgeon: Chelsea Bergeron, MD;  Location: [facility] ENDOSCOPY;  Service: Cardiovascular;  Laterality: N/A;  . CARDIOVERSION N/A 10/23/2020   Procedure: CARDIOVERSION;  Surgeon: Chelsea Dresser, MD;  Location: [facility] ENDOSCOPY;  Service: Cardiovascular;  Laterality: N/A;  . CARPAL TUNNEL RELEASE Left 2012   ulnar nerve release at elbow  . CERVICAL FUSION  2003, 2005, 2013  . COLONOSCOPY    . ELECTROPHYSIOLOGIC STUDY N/A 08/10/2015   Procedure: Afib;  Surgeon: Chelsea Swanner Meredith Leeds, MD;  Location: [place] CV LAB;  Service: Cardiovascular;  Laterality: N/A;  . ELECTROPHYSIOLOGIC STUDY N/A 08/15/2016   Procedure: Atrial Fibrillation Ablation;  Surgeon: Chelsea Conteh Meredith Leeds, MD;  Location: [place] CV LAB;  Service: Cardiovascular;  Laterality: N/A;  . EYE SURGERY  2015   b/l cataracts removed  . left elbow surgery  06/03/11   cyst removed  . LUMBAR LAMINECTOMY  1960   x 2  . PACEMAKER IMPLANT  N/A 09/19/2020   Procedure: PACEMAKER IMPLANT;  Surgeon: Chelsea Haw, MD;  Location: [place] CV LAB;  Service: Cardiovascular;  Laterality: N/A;  . POSTERIOR CERVICAL FUSION/FORAMINOTOMY  08/03/2012   Procedure: POSTERIOR CERVICAL FUSION/FORAMINOTOMY LEVEL 5;  Surgeon: Chelsea Miss, MD;  Location: [place] NEURO ORS;  Service: Neurosurgery;  Laterality: N/A;  Cervical four to Thoracic two  Posterior cervical decompression with Facet and Pedicle screw fixation  . TEE WITHOUT CARDIOVERSION N/A 10/22/2017   Procedure: TRANSESOPHAGEAL ECHOCARDIOGRAM (TEE);  Surgeon: Chelsea Casino, MD;  Location: [facility] ENDOSCOPY;  Service: Cardiovascular;  Laterality: N/A;  . TONSILLECTOMY  1943  . TOTAL HIP ARTHROPLASTY Left 2012  . TOTAL KNEE ARTHROPLASTY Right 11/20/2014   Procedure: RIGHT TOTAL KNEE ARTHROPLASTY;  Surgeon: Chelsea Alf, MD;  Location: [facility] ORS;  Service: Orthopedics;  Laterality: Right;  . veins stripped  1970  . WRIST SURGERY Right 1990's   cyst removed     Current Outpatient Medications  Medication Sig Dispense Refill  . atorvastatin (LIPITOR) 10 MG tablet Take 10 mg by mouth daily.    Marland Kitchen CALCIUM+D3 600-20 MG-MCG TABS Take 1 tablet by mouth at bedtime.    . clobetasol (TEMOVATE) 0.05 % GEL Apply 1 application topically 2 (two) times daily as needed (skin irritation.). APPLY TO THE AFFECTED AREA OF SKIN ON ARMS TWICE DAILY  0  . denosumab (PROLIA) 60 MG/ML SOSY injection Inject 60 mg into the skin every 6 (six) months.    Marland Kitchen ELIQUIS 5 MG TABS tablet Take 1 tablet by mouth twice daily. 60 tablet 3  . esomeprazole (NEXIUM) 40 MG capsule TAKE ONE CAPSULE BY MOUTH EVERY DAY AT 12 NOON 90 capsule 2  . ezetimibe (ZETIA) 10 MG tablet TAKE 10 MG BY MOUTH AT BEDTIME 30 tablet 6  . ferrous sulfate 325 (65 FE) MG tablet Take 325 mg by mouth daily with breakfast.    . gabapentin (NEURONTIN) 600 MG tablet TAKE 1 TABLET(600 MG) BY MOUTH TWICE DAILY 180 tablet 0  . levothyroxine (SYNTHROID) 25 MCG tablet Take 25 mcg by mouth daily before breakfast.    . Melatonin 10 MG TABS Take 10 mg by mouth at bedtime.    . metoprolol succinate (TOPROL-XL) 50 MG 24 hr tablet Take 1 tablet (50 mg total) by mouth at bedtime. Take with or immediately following a meal. 30 tablet 3  . Multiple Vitamin (MULTIVITAMIN WITH MINERALS) TABS tablet Take 1 tablet by mouth daily. Centrum Silver for Women    .  nortriptyline (PAMELOR) 50 MG capsule Take 100 mg by mouth at bedtime.    Vladimir Faster Glycol-Propyl Glycol (SYSTANE OP) Place 1 drop into both eyes 4 (four) times daily as needed (dry/irritated eyes.).      No current facility-administered medications for this visit.    Allergies:   Cefuroxime axetil, Macrodantin, Alendronate sodium, Ciprofibrate, Codeine, Demerol [meperidine], Nitrofurantoin, Other, Penicillins, Pravastatin, and Sulfonamide derivatives   Social History:  The patient  reports that she has quit smoking. Her smoking use included cigarettes. She has a 1.00 pack-year smoking history. She has never used smokeless tobacco. She reports current alcohol use. She reports that she does not use drugs.   Family History:  The patient's family history includes Anxiety disorder in her mother; Bipolar disorder in her mother; Colon cancer in her father; HIV/AIDS in her son; Healthy in her daughter; Heart disease in her maternal grandmother; Mental illness in her mother; Stomach cancer in her maternal grandfather, mother, and paternal grandmother; Stroke in  her father; Throat cancer in her maternal uncle.   ROS:  Please see the history of present illness.   Otherwise, review of systems is positive for none.   All other systems are reviewed and negative.   PHYSICAL EXAM: VS:  BP 110/68   Pulse 92   Ht 5\' 2"  (1.575 m)   Wt 156 lb 6.4 oz (70.9 kg)   SpO2 93%   BMI 28.61 kg/m  , BMI Body mass index is 28.61 kg/m. GEN: Well nourished, well developed, in no acute distress  HEENT: normal  Neck: no JVD, carotid bruits, or masses Cardiac: irregular; no murmurs, rubs, or gallops,no edema  Respiratory:  clear to auscultation bilaterally, normal work of breathing GI: soft, nontender, nondistended, + BS MS: no deformity or atrophy  Skin: warm and dry, device site well healed Neuro:  Strength and sensation are intact Psych: euthymic mood, full affect  EKG:  EKG is ordered today. Personal review of  the ekg ordered shows atrial fibrillation, intermittent ventricular paced  Personal review of the device interrogation today. Results in [place]: 07/04/2020: ALT 27 10/10/2020: BUN 12; Creatinine, Ser 0.71; Hemoglobin 11.3; Platelets 421; Potassium 3.8; Sodium 137    Lipid Panel     Component Value Date/Time   CHOL 161 04/16/2019 0352   TRIG 120 04/16/2019 0352   TRIG 133 06/30/2006 1110   HDL 53 04/16/2019 0352   CHOLHDL 3.0 04/16/2019 0352   VLDL 24 04/16/2019 0352   LDLCALC 84 04/16/2019 0352   LDLDIRECT 93.3 08/04/2008 0915     Wt Readings from Last 3 Encounters:  12/27/20 156 lb 6.4 oz (70.9 kg)  11/02/20 153 lb 9.6 oz (69.7 kg)  10/30/20 155 lb 6.4 oz (70.5 kg)      Other studies Reviewed: Additional studies/ records that were reviewed today include: TEE 10/22/17  Review of the above records today demonstrates:  - Left ventricle: There was mild concentric hypertrophy. Systolic   function was mildly reduced. The estimated ejection fraction was   in the range of 45% to 50%. Diffuse hypokinesis. No evidence of   thrombus. - Aortic valve: No evidence of vegetation. - Mitral valve: There was mild regurgitation. - Left atrium: No evidence of thrombus in the atrial cavity or   appendage. No evidence of thrombus in the atrial cavity or   appendage. - Right atrium: No evidence of thrombus in the atrial cavity or   appendage. 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 This was visualized in 2D and 3D modes. - Tricuspid valve: There was mild regurgitation. - Pulmonic valve: There was trivial regurgitation.   ASSESSMENT AND PLAN:  1.  Persistent atrial fibrillation: Status post ablation 08/15/2016.  Currently on amiodarone and Eliquis.   High risk medication monitoring. She has failed multiple cardioversions at this point.  We Niyam Bisping plan for AV node ablation.  Risk and benefits of been discussed include bleeding, tamponade, stroke.  The patient understands these risks and is agreed to the procedure.  We Alizah Sills stop amiodarone today  2.  Hypertension: Currently well controlled  3.  Hyperlipidemia: Continue atorvastatin and Zetia  4.  Tachybradycardia syndrome: Status post Medtronic dual-chamber pacemaker implanted 09/19/2020.  Device functioning appropriately.  No changes at  this time.    Current medicines are reviewed at length with the patient today.   The patient does not have concerns regarding her medicines.  The following changes were made today: Stop amiodarone  Labs/ tests ordered today include:  Orders Placed This Encounter  Procedures  . EKG 12-Lead     Disposition:   FU with Jaleen Grupp 3 months  Signed, Nhat Hearne Meredith Leeds, MD  12/27/2020 2:41 PM     [place] [place] [id number] [phone] (office) [phone] (fax)

## 2020-12-28 ENCOUNTER — Other Ambulatory Visit: Payer: Self-pay | Admitting: Cardiology

## 2020-12-28 DIAGNOSIS — I482 Chronic atrial fibrillation, unspecified: Secondary | ICD-10-CM | POA: Diagnosis not present

## 2020-12-28 DIAGNOSIS — E039 Hypothyroidism, unspecified: Secondary | ICD-10-CM | POA: Diagnosis not present

## 2020-12-28 DIAGNOSIS — K219 Gastro-esophageal reflux disease without esophagitis: Secondary | ICD-10-CM | POA: Diagnosis not present

## 2020-12-28 DIAGNOSIS — I10 Essential (primary) hypertension: Secondary | ICD-10-CM | POA: Diagnosis not present

## 2020-12-28 DIAGNOSIS — F5104 Psychophysiologic insomnia: Secondary | ICD-10-CM | POA: Diagnosis not present

## 2020-12-28 DIAGNOSIS — I2510 Atherosclerotic heart disease of native coronary artery without angina pectoris: Secondary | ICD-10-CM | POA: Diagnosis not present

## 2020-12-28 LAB — CBC WITH DIFFERENTIAL/PLATELET
Basophils Absolute: 0.1 10*3/uL (ref 0.0–0.2)
Basos: 1 %
EOS (ABSOLUTE): 0.1 10*3/uL (ref 0.0–0.4)
Eos: 1 %
Hematocrit: 38.1 % (ref 34.0–46.6)
Hemoglobin: 12.7 g/dL (ref 11.1–15.9)
Immature Grans (Abs): 0.1 10*3/uL (ref 0.0–0.1)
Immature Granulocytes: 1 %
Lymphocytes Absolute: 2 10*3/uL (ref 0.7–3.1)
Lymphs: 23 %
MCH: 30 pg (ref 26.6–33.0)
MCHC: 33.3 g/dL (ref 31.5–35.7)
MCV: 90 fL (ref 79–97)
Monocytes Absolute: 0.8 10*3/uL (ref 0.1–0.9)
Monocytes: 9 %
Neutrophils Absolute: 5.7 10*3/uL (ref 1.4–7.0)
Neutrophils: 65 %
Platelets: 281 10*3/uL (ref 150–450)
RBC: 4.24 x10E6/uL (ref 3.77–5.28)
RDW: 15.7 % — ABNORMAL HIGH (ref 11.7–15.4)
WBC: 8.7 10*3/uL (ref 3.4–10.8)

## 2020-12-28 LAB — BASIC METABOLIC PANEL
BUN/Creatinine Ratio: 21 (ref 12–28)
BUN: 21 mg/dL (ref 8–27)
CO2: 18 mmol/L — ABNORMAL LOW (ref 20–29)
Calcium: 8.9 mg/dL (ref 8.7–10.3)
Chloride: 103 mmol/L (ref 96–106)
Creatinine, Ser: 1.01 mg/dL — ABNORMAL HIGH (ref 0.57–1.00)
Glucose: 100 mg/dL — ABNORMAL HIGH (ref 65–99)
Potassium: 4.6 mmol/L (ref 3.5–5.2)
Sodium: 143 mmol/L (ref 134–144)
eGFR: 55 mL/min/{1.73_m2} — ABNORMAL LOW (ref >59)

## 2020-12-31 DIAGNOSIS — K219 Gastro-esophageal reflux disease without esophagitis: Secondary | ICD-10-CM | POA: Diagnosis not present

## 2020-12-31 DIAGNOSIS — I2510 Atherosclerotic heart disease of native coronary artery without angina pectoris: Secondary | ICD-10-CM | POA: Diagnosis not present

## 2020-12-31 DIAGNOSIS — I10 Essential (primary) hypertension: Secondary | ICD-10-CM | POA: Diagnosis not present

## 2020-12-31 DIAGNOSIS — I482 Chronic atrial fibrillation, unspecified: Secondary | ICD-10-CM | POA: Diagnosis not present

## 2020-12-31 DIAGNOSIS — E039 Hypothyroidism, unspecified: Secondary | ICD-10-CM | POA: Diagnosis not present

## 2020-12-31 DIAGNOSIS — F5104 Psychophysiologic insomnia: Secondary | ICD-10-CM | POA: Diagnosis not present

## 2021-01-01 NOTE — Progress Notes (Signed)
Remote pacemaker transmission.   

## 2021-01-02 DIAGNOSIS — E039 Hypothyroidism, unspecified: Secondary | ICD-10-CM | POA: Diagnosis not present

## 2021-01-02 DIAGNOSIS — I10 Essential (primary) hypertension: Secondary | ICD-10-CM | POA: Diagnosis not present

## 2021-01-02 DIAGNOSIS — K219 Gastro-esophageal reflux disease without esophagitis: Secondary | ICD-10-CM | POA: Diagnosis not present

## 2021-01-02 DIAGNOSIS — I482 Chronic atrial fibrillation, unspecified: Secondary | ICD-10-CM | POA: Diagnosis not present

## 2021-01-02 DIAGNOSIS — F5104 Psychophysiologic insomnia: Secondary | ICD-10-CM | POA: Diagnosis not present

## 2021-01-02 DIAGNOSIS — I2510 Atherosclerotic heart disease of native coronary artery without angina pectoris: Secondary | ICD-10-CM | POA: Diagnosis not present

## 2021-01-03 DIAGNOSIS — I482 Chronic atrial fibrillation, unspecified: Secondary | ICD-10-CM | POA: Diagnosis not present

## 2021-01-03 DIAGNOSIS — I2510 Atherosclerotic heart disease of native coronary artery without angina pectoris: Secondary | ICD-10-CM | POA: Diagnosis not present

## 2021-01-03 DIAGNOSIS — K219 Gastro-esophageal reflux disease without esophagitis: Secondary | ICD-10-CM | POA: Diagnosis not present

## 2021-01-03 DIAGNOSIS — F5104 Psychophysiologic insomnia: Secondary | ICD-10-CM | POA: Diagnosis not present

## 2021-01-03 DIAGNOSIS — E039 Hypothyroidism, unspecified: Secondary | ICD-10-CM | POA: Diagnosis not present

## 2021-01-03 DIAGNOSIS — I10 Essential (primary) hypertension: Secondary | ICD-10-CM | POA: Diagnosis not present

## 2021-01-07 DIAGNOSIS — I10 Essential (primary) hypertension: Secondary | ICD-10-CM | POA: Diagnosis not present

## 2021-01-07 DIAGNOSIS — F5104 Psychophysiologic insomnia: Secondary | ICD-10-CM | POA: Diagnosis not present

## 2021-01-07 DIAGNOSIS — K219 Gastro-esophageal reflux disease without esophagitis: Secondary | ICD-10-CM | POA: Diagnosis not present

## 2021-01-07 DIAGNOSIS — E039 Hypothyroidism, unspecified: Secondary | ICD-10-CM | POA: Diagnosis not present

## 2021-01-07 DIAGNOSIS — I482 Chronic atrial fibrillation, unspecified: Secondary | ICD-10-CM | POA: Diagnosis not present

## 2021-01-07 DIAGNOSIS — I2510 Atherosclerotic heart disease of native coronary artery without angina pectoris: Secondary | ICD-10-CM | POA: Diagnosis not present

## 2021-01-09 DIAGNOSIS — I2510 Atherosclerotic heart disease of native coronary artery without angina pectoris: Secondary | ICD-10-CM | POA: Diagnosis not present

## 2021-01-09 DIAGNOSIS — E039 Hypothyroidism, unspecified: Secondary | ICD-10-CM | POA: Diagnosis not present

## 2021-01-09 DIAGNOSIS — F5104 Psychophysiologic insomnia: Secondary | ICD-10-CM | POA: Diagnosis not present

## 2021-01-09 DIAGNOSIS — I482 Chronic atrial fibrillation, unspecified: Secondary | ICD-10-CM | POA: Diagnosis not present

## 2021-01-09 DIAGNOSIS — K219 Gastro-esophageal reflux disease without esophagitis: Secondary | ICD-10-CM | POA: Diagnosis not present

## 2021-01-09 DIAGNOSIS — I10 Essential (primary) hypertension: Secondary | ICD-10-CM | POA: Diagnosis not present

## 2021-01-14 ENCOUNTER — Telehealth: Payer: Self-pay | Admitting: Cardiology

## 2021-01-14 DIAGNOSIS — K219 Gastro-esophageal reflux disease without esophagitis: Secondary | ICD-10-CM | POA: Diagnosis not present

## 2021-01-14 DIAGNOSIS — I10 Essential (primary) hypertension: Secondary | ICD-10-CM | POA: Diagnosis not present

## 2021-01-14 DIAGNOSIS — I482 Chronic atrial fibrillation, unspecified: Secondary | ICD-10-CM | POA: Diagnosis not present

## 2021-01-14 DIAGNOSIS — E039 Hypothyroidism, unspecified: Secondary | ICD-10-CM | POA: Diagnosis not present

## 2021-01-14 DIAGNOSIS — F5104 Psychophysiologic insomnia: Secondary | ICD-10-CM | POA: Diagnosis not present

## 2021-01-14 DIAGNOSIS — I2510 Atherosclerotic heart disease of native coronary artery without angina pectoris: Secondary | ICD-10-CM | POA: Diagnosis not present

## 2021-01-14 NOTE — Telephone Encounter (Signed)
Sent instruction via mychart as requested.

## 2021-01-14 NOTE — Telephone Encounter (Signed)
Patient's daughter returning a call. She states that her mother's ablation time got changed. She says you can contact her through mychart because she will be in class the rest of the day.

## 2021-01-15 ENCOUNTER — Other Ambulatory Visit (HOSPITAL_COMMUNITY)
Admission: RE | Admit: 2021-01-15 | Discharge: 2021-01-15 | Disposition: A | Discharge status: Home / Self Care | Payer: Medicare Other | Source: Ambulatory Visit | Attending: Cardiology | Admitting: Cardiology

## 2021-01-15 DIAGNOSIS — Z01812 Encounter for preprocedural laboratory examination: Secondary | ICD-10-CM | POA: Insufficient documentation

## 2021-01-15 DIAGNOSIS — Z20822 Contact with and (suspected) exposure to covid-19: Secondary | ICD-10-CM | POA: Diagnosis not present

## 2021-01-16 LAB — SARS CORONAVIRUS 2 (TAT 6-24 HRS): SARS Coronavirus 2: NEGATIVE

## 2021-01-17 ENCOUNTER — Encounter (HOSPITAL_COMMUNITY)
Admission: RE | Disposition: A | Discharge status: Home / Self Care | Payer: Self-pay | Source: Home / Self Care | Attending: Cardiology

## 2021-01-17 ENCOUNTER — Other Ambulatory Visit: Payer: Self-pay

## 2021-01-17 ENCOUNTER — Encounter (HOSPITAL_COMMUNITY): Payer: Self-pay | Admitting: Certified Registered"

## 2021-01-17 ENCOUNTER — Ambulatory Visit (HOSPITAL_COMMUNITY)
Admission: RE | Admit: 2021-01-17 | Discharge: 2021-01-17 | Disposition: A | Discharge status: Home / Self Care | Payer: Medicare Other | Attending: Cardiology | Admitting: Cardiology

## 2021-01-17 DIAGNOSIS — Z79899 Other long term (current) drug therapy: Secondary | ICD-10-CM | POA: Diagnosis not present

## 2021-01-17 DIAGNOSIS — Z880 Allergy status to penicillin: Secondary | ICD-10-CM | POA: Insufficient documentation

## 2021-01-17 DIAGNOSIS — Z881 Allergy status to other antibiotic agents status: Secondary | ICD-10-CM | POA: Diagnosis not present

## 2021-01-17 DIAGNOSIS — Z885 Allergy status to narcotic agent status: Secondary | ICD-10-CM | POA: Diagnosis not present

## 2021-01-17 DIAGNOSIS — E785 Hyperlipidemia, unspecified: Secondary | ICD-10-CM | POA: Insufficient documentation

## 2021-01-17 DIAGNOSIS — I495 Sick sinus syndrome: Secondary | ICD-10-CM | POA: Diagnosis not present

## 2021-01-17 DIAGNOSIS — Z87891 Personal history of nicotine dependence: Secondary | ICD-10-CM | POA: Diagnosis not present

## 2021-01-17 DIAGNOSIS — Z950 Presence of cardiac pacemaker: Secondary | ICD-10-CM | POA: Insufficient documentation

## 2021-01-17 DIAGNOSIS — Z882 Allergy status to sulfonamides status: Secondary | ICD-10-CM | POA: Diagnosis not present

## 2021-01-17 DIAGNOSIS — Z7901 Long term (current) use of anticoagulants: Secondary | ICD-10-CM | POA: Insufficient documentation

## 2021-01-17 DIAGNOSIS — I10 Essential (primary) hypertension: Secondary | ICD-10-CM | POA: Insufficient documentation

## 2021-01-17 DIAGNOSIS — Z79890 Hormone replacement therapy (postmenopausal): Secondary | ICD-10-CM | POA: Diagnosis not present

## 2021-01-17 DIAGNOSIS — I4819 Other persistent atrial fibrillation: Secondary | ICD-10-CM | POA: Diagnosis not present

## 2021-01-17 DIAGNOSIS — I4891 Unspecified atrial fibrillation: Secondary | ICD-10-CM | POA: Diagnosis not present

## 2021-01-17 HISTORY — PX: AV NODE ABLATION: EP1193

## 2021-01-17 SURGERY — AV NODE ABLATION
Anesthesia: General

## 2021-01-17 MED ORDER — ACETAMINOPHEN 325 MG PO TABS
650.0000 mg | ORAL_TABLET | ORAL | Status: DC | PRN
  Filled 2021-01-17: qty 2

## 2021-01-17 MED ORDER — VANCOMYCIN HCL IN DEXTROSE 1-5 GM/200ML-% IV SOLN
INTRAVENOUS | Status: AC
  Filled 2021-01-17: qty 200

## 2021-01-17 MED ORDER — HEPARIN (PORCINE) IN NACL 1000-0.9 UT/500ML-% IV SOLN
INTRAVENOUS | Status: AC
  Filled 2021-01-17: qty 500

## 2021-01-17 MED ORDER — SODIUM CHLORIDE 0.9 % IV SOLN: 250.0000 mL | INTRAVENOUS | Status: DC | PRN

## 2021-01-17 MED ORDER — BUPIVACAINE HCL (PF) 0.25 % IJ SOLN
INTRAMUSCULAR | Status: AC
  Filled 2021-01-17: qty 30

## 2021-01-17 MED ORDER — MIDAZOLAM HCL 5 MG/5ML IJ SOLN
INTRAMUSCULAR | Status: AC
  Filled 2021-01-17: qty 5

## 2021-01-17 MED ORDER — FENTANYL CITRATE (PF) 100 MCG/2ML IJ SOLN
INTRAMUSCULAR | Status: DC | PRN
  Administered 2021-01-17: 25 ug via INTRAVENOUS

## 2021-01-17 MED ORDER — VANCOMYCIN HCL 1000 MG IV SOLR
INTRAVENOUS | Status: AC | PRN
  Administered 2021-01-17: 1000 mg via INTRAVENOUS

## 2021-01-17 MED ORDER — BUPIVACAINE HCL (PF) 0.25 % IJ SOLN
INTRAMUSCULAR | Status: DC | PRN
  Administered 2021-01-17: 30 mL

## 2021-01-17 MED ORDER — ONDANSETRON HCL 4 MG/2ML IJ SOLN: 4.0000 mg | Freq: Four times a day (QID) | INTRAMUSCULAR | Status: DC | PRN

## 2021-01-17 MED ORDER — SODIUM CHLORIDE 0.9% FLUSH: 3.0000 mL | Freq: Two times a day (BID) | INTRAVENOUS | Status: DC

## 2021-01-17 MED ORDER — SODIUM CHLORIDE 0.9 % IV SOLN: INTRAVENOUS | Status: DC

## 2021-01-17 MED ORDER — FENTANYL CITRATE (PF) 100 MCG/2ML IJ SOLN
INTRAMUSCULAR | Status: AC
  Filled 2021-01-17: qty 2

## 2021-01-17 MED ORDER — SODIUM CHLORIDE 0.9% FLUSH: 3.0000 mL | INTRAVENOUS | Status: DC | PRN

## 2021-01-17 MED ORDER — MIDAZOLAM HCL 5 MG/5ML IJ SOLN
INTRAMUSCULAR | Status: DC | PRN
  Administered 2021-01-17: 1 mg via INTRAVENOUS

## 2021-01-17 MED ORDER — HEPARIN (PORCINE) IN NACL 1000-0.9 UT/500ML-% IV SOLN
INTRAVENOUS | Status: DC | PRN
  Administered 2021-01-17: 500 mL

## 2021-01-17 SURGICAL SUPPLY — 8 items
BAG SNAP BAND KOVER 36X36 (MISCELLANEOUS) ×2 IMPLANT
CATH CELSIUS THERM D CV 7F (ABLATOR) ×2 IMPLANT
CLOSURE PERCLOSE PROSTYLE (VASCULAR PRODUCTS) ×2 IMPLANT
PACK EP LATEX FREE (CUSTOM PROCEDURE TRAY) ×2
PACK EP LF (CUSTOM PROCEDURE TRAY) ×1 IMPLANT
PAD PRO RADIOLUCENT 2001M-C (PAD) ×2 IMPLANT
SHEATH PINNACLE 8F 10CM (SHEATH) ×2 IMPLANT
SHEATH PROBE COVER 6X72 (BAG) ×2 IMPLANT

## 2021-01-17 NOTE — Interval H&P Note (Signed)
History and Physical Interval Note:  01/17/2021 11:52 AM  Chelsea Hancock  has presented today for surgery, with the diagnosis of cardiomyopathy.  The various methods of treatment have been discussed with the patient and family. After consideration of risks, benefits and other options for treatment, the patient has consented to  Procedure(s): AV NODE ABLATION (N/A) as a surgical intervention.  The patient's history has been reviewed, patient examined, no change in status, stable for surgery.  I have reviewed the patient's chart and labs.  Questions were answered to the patient's satisfaction.     Mahmud Keithly [employer]

## 2021-01-17 NOTE — Discharge Instructions (Signed)
Post procedure care instructions No driving for 4 days. No lifting over 5 lbs for 1 week. No vigorous or sexual activity for 1 week. You may return to work/your usual activities on 01/25/21. Keep procedure site clean & dry. If you notice increased pain, swelling, bleeding or pus, call/return!  You may shower after 24 hours, but no soaking in baths/hot tubs/pools for 1 week.    Femoral Site Care  This sheet gives you information about how to care for yourself after your procedure. Your health care provider may also give you more specific instructions. If you have problems or questions, contact your health care provider. What can I expect after the procedure? After the procedure, it is common to have:  Bruising that usually fades within 1-2 weeks.  Tenderness at the site. Follow these instructions at home: Wound care  Follow instructions from your health care provider about how to take care of your insertion site. Make sure you: ? Wash your hands with soap and water before you change your bandage (dressing). If soap and water are not available, use hand sanitizer. ? Change your dressing as told by your health care provider. ? Leave stitches (sutures), skin glue, or adhesive strips in place. These skin closures may need to stay in place for 2 weeks or longer. If adhesive strip edges start to loosen and curl up, you may trim the loose edges. Do not remove adhesive strips completely unless your health care provider tells you to do that.  Do not take baths, swim, or use a hot tub until your health care provider approves.  You may shower 24-48 hours after the procedure or as told by your health care provider. ? Gently wash the site with plain soap and water. ? Pat the area dry with a clean towel. ? Do not rub the site. This may cause bleeding.  Do not apply powder or lotion to the site. Keep the site clean and dry.  Check your femoral site every day for signs of infection. Check for: ? Redness,  swelling, or pain. ? Fluid or blood. ? Warmth. ? Pus or a bad smell. Activity  For the first 2-3 days after your procedure, or as long as directed: ? Avoid climbing stairs as much as possible. ? Do not squat.  Do not lift anything that is heavier than 10 lb (4.5 kg), or the limit that you are told, until your health care provider says that it is safe.  Rest as directed. ? Avoid sitting for a long time without moving. Get up to take short walks every 1-2 hours.  Do not drive for 24 hours if you were given a medicine to help you relax (sedative). General instructions  Take over-the-counter and prescription medicines only as told by your health care provider.  Keep all follow-up visits as told by your health care provider. This is important. Contact a health care provider if you have:  A fever or chills.  You have redness, swelling, or pain around your insertion site. Get help right away if:  The catheter insertion area swells very fast.  You pass out.  You suddenly start to sweat or your skin gets clammy.  The catheter insertion area is bleeding, and the bleeding does not stop when you hold steady pressure on the area.  The area near or just beyond the catheter insertion site becomes pale, cool, tingly, or numb. These symptoms may represent a serious problem that is an emergency. Do not wait to see  if the symptoms will go away. Get medical help right away. Call your local emergency services (911 in the [place] Do not drive yourself to the hospital. Summary  After the procedure, it is common to have bruising that usually fades within 1-2 weeks.  Check your femoral site every day for signs of infection.  Do not lift anything that is heavier than 10 lb (4.5 kg), or the limit that you are told, until your health care provider says that it is safe. This information is not intended to replace advice given to you by your health care provider. Make sure you discuss any questions you  have with your health care provider. Document Revised: 05/11/2020 Document Reviewed: 05/11/2020 Elsevier Patient Education  [place].

## 2021-01-18 ENCOUNTER — Encounter (HOSPITAL_COMMUNITY): Payer: Self-pay | Admitting: Cardiology

## 2021-01-19 DIAGNOSIS — F5104 Psychophysiologic insomnia: Secondary | ICD-10-CM | POA: Diagnosis not present

## 2021-01-19 DIAGNOSIS — E039 Hypothyroidism, unspecified: Secondary | ICD-10-CM | POA: Diagnosis not present

## 2021-01-19 DIAGNOSIS — I480 Paroxysmal atrial fibrillation: Secondary | ICD-10-CM | POA: Diagnosis not present

## 2021-01-19 DIAGNOSIS — Z7901 Long term (current) use of anticoagulants: Secondary | ICD-10-CM | POA: Diagnosis not present

## 2021-01-19 DIAGNOSIS — Z9181 History of falling: Secondary | ICD-10-CM | POA: Diagnosis not present

## 2021-01-19 DIAGNOSIS — E7800 Pure hypercholesterolemia, unspecified: Secondary | ICD-10-CM | POA: Diagnosis not present

## 2021-01-19 DIAGNOSIS — K219 Gastro-esophageal reflux disease without esophagitis: Secondary | ICD-10-CM | POA: Diagnosis not present

## 2021-01-19 DIAGNOSIS — F331 Major depressive disorder, recurrent, moderate: Secondary | ICD-10-CM | POA: Diagnosis not present

## 2021-01-19 DIAGNOSIS — I482 Chronic atrial fibrillation, unspecified: Secondary | ICD-10-CM | POA: Diagnosis not present

## 2021-01-19 DIAGNOSIS — Z950 Presence of cardiac pacemaker: Secondary | ICD-10-CM | POA: Diagnosis not present

## 2021-01-19 DIAGNOSIS — G3184 Mild cognitive impairment, so stated: Secondary | ICD-10-CM | POA: Diagnosis not present

## 2021-01-19 DIAGNOSIS — I10 Essential (primary) hypertension: Secondary | ICD-10-CM | POA: Diagnosis not present

## 2021-01-19 DIAGNOSIS — I2510 Atherosclerotic heart disease of native coronary artery without angina pectoris: Secondary | ICD-10-CM | POA: Diagnosis not present

## 2021-01-21 DIAGNOSIS — I2510 Atherosclerotic heart disease of native coronary artery without angina pectoris: Secondary | ICD-10-CM | POA: Diagnosis not present

## 2021-01-21 DIAGNOSIS — E039 Hypothyroidism, unspecified: Secondary | ICD-10-CM | POA: Diagnosis not present

## 2021-01-21 DIAGNOSIS — I10 Essential (primary) hypertension: Secondary | ICD-10-CM | POA: Diagnosis not present

## 2021-01-21 DIAGNOSIS — K219 Gastro-esophageal reflux disease without esophagitis: Secondary | ICD-10-CM | POA: Diagnosis not present

## 2021-01-21 DIAGNOSIS — F5104 Psychophysiologic insomnia: Secondary | ICD-10-CM | POA: Diagnosis not present

## 2021-01-21 DIAGNOSIS — I482 Chronic atrial fibrillation, unspecified: Secondary | ICD-10-CM | POA: Diagnosis not present

## 2021-01-23 DIAGNOSIS — E039 Hypothyroidism, unspecified: Secondary | ICD-10-CM | POA: Diagnosis not present

## 2021-01-23 DIAGNOSIS — E785 Hyperlipidemia, unspecified: Secondary | ICD-10-CM | POA: Diagnosis not present

## 2021-01-23 DIAGNOSIS — I482 Chronic atrial fibrillation, unspecified: Secondary | ICD-10-CM | POA: Diagnosis not present

## 2021-01-23 DIAGNOSIS — I2510 Atherosclerotic heart disease of native coronary artery without angina pectoris: Secondary | ICD-10-CM | POA: Diagnosis not present

## 2021-01-23 DIAGNOSIS — I10 Essential (primary) hypertension: Secondary | ICD-10-CM | POA: Diagnosis not present

## 2021-01-23 DIAGNOSIS — I4811 Longstanding persistent atrial fibrillation: Secondary | ICD-10-CM | POA: Diagnosis not present

## 2021-01-23 DIAGNOSIS — F5104 Psychophysiologic insomnia: Secondary | ICD-10-CM | POA: Diagnosis not present

## 2021-01-23 DIAGNOSIS — K219 Gastro-esophageal reflux disease without esophagitis: Secondary | ICD-10-CM | POA: Diagnosis not present

## 2021-01-23 DIAGNOSIS — I313 Pericardial effusion (noninflammatory): Secondary | ICD-10-CM | POA: Diagnosis not present

## 2021-01-23 DIAGNOSIS — I5042 Chronic combined systolic (congestive) and diastolic (congestive) heart failure: Secondary | ICD-10-CM | POA: Diagnosis not present

## 2021-01-25 DIAGNOSIS — I2510 Atherosclerotic heart disease of native coronary artery without angina pectoris: Secondary | ICD-10-CM | POA: Diagnosis not present

## 2021-01-25 DIAGNOSIS — I482 Chronic atrial fibrillation, unspecified: Secondary | ICD-10-CM | POA: Diagnosis not present

## 2021-01-25 DIAGNOSIS — I10 Essential (primary) hypertension: Secondary | ICD-10-CM | POA: Diagnosis not present

## 2021-01-25 DIAGNOSIS — E039 Hypothyroidism, unspecified: Secondary | ICD-10-CM | POA: Diagnosis not present

## 2021-01-25 DIAGNOSIS — K219 Gastro-esophageal reflux disease without esophagitis: Secondary | ICD-10-CM | POA: Diagnosis not present

## 2021-01-25 DIAGNOSIS — F5104 Psychophysiologic insomnia: Secondary | ICD-10-CM | POA: Diagnosis not present

## 2021-01-28 DIAGNOSIS — E039 Hypothyroidism, unspecified: Secondary | ICD-10-CM | POA: Diagnosis not present

## 2021-01-28 DIAGNOSIS — I10 Essential (primary) hypertension: Secondary | ICD-10-CM | POA: Diagnosis not present

## 2021-01-28 DIAGNOSIS — F5104 Psychophysiologic insomnia: Secondary | ICD-10-CM | POA: Diagnosis not present

## 2021-01-28 DIAGNOSIS — I2510 Atherosclerotic heart disease of native coronary artery without angina pectoris: Secondary | ICD-10-CM | POA: Diagnosis not present

## 2021-01-28 DIAGNOSIS — K219 Gastro-esophageal reflux disease without esophagitis: Secondary | ICD-10-CM | POA: Diagnosis not present

## 2021-01-28 DIAGNOSIS — I482 Chronic atrial fibrillation, unspecified: Secondary | ICD-10-CM | POA: Diagnosis not present

## 2021-01-30 DIAGNOSIS — K219 Gastro-esophageal reflux disease without esophagitis: Secondary | ICD-10-CM | POA: Diagnosis not present

## 2021-01-30 DIAGNOSIS — I482 Chronic atrial fibrillation, unspecified: Secondary | ICD-10-CM | POA: Diagnosis not present

## 2021-01-30 DIAGNOSIS — F5104 Psychophysiologic insomnia: Secondary | ICD-10-CM | POA: Diagnosis not present

## 2021-01-30 DIAGNOSIS — I2510 Atherosclerotic heart disease of native coronary artery without angina pectoris: Secondary | ICD-10-CM | POA: Diagnosis not present

## 2021-01-30 DIAGNOSIS — E039 Hypothyroidism, unspecified: Secondary | ICD-10-CM | POA: Diagnosis not present

## 2021-01-30 DIAGNOSIS — I10 Essential (primary) hypertension: Secondary | ICD-10-CM | POA: Diagnosis not present

## 2021-02-04 DIAGNOSIS — I10 Essential (primary) hypertension: Secondary | ICD-10-CM | POA: Diagnosis not present

## 2021-02-04 DIAGNOSIS — I2510 Atherosclerotic heart disease of native coronary artery without angina pectoris: Secondary | ICD-10-CM | POA: Diagnosis not present

## 2021-02-04 DIAGNOSIS — I482 Chronic atrial fibrillation, unspecified: Secondary | ICD-10-CM | POA: Diagnosis not present

## 2021-02-04 DIAGNOSIS — K219 Gastro-esophageal reflux disease without esophagitis: Secondary | ICD-10-CM | POA: Diagnosis not present

## 2021-02-04 DIAGNOSIS — E039 Hypothyroidism, unspecified: Secondary | ICD-10-CM | POA: Diagnosis not present

## 2021-02-04 DIAGNOSIS — F5104 Psychophysiologic insomnia: Secondary | ICD-10-CM | POA: Diagnosis not present

## 2021-02-11 DIAGNOSIS — F5104 Psychophysiologic insomnia: Secondary | ICD-10-CM | POA: Diagnosis not present

## 2021-02-11 DIAGNOSIS — K219 Gastro-esophageal reflux disease without esophagitis: Secondary | ICD-10-CM | POA: Diagnosis not present

## 2021-02-11 DIAGNOSIS — I10 Essential (primary) hypertension: Secondary | ICD-10-CM | POA: Diagnosis not present

## 2021-02-11 DIAGNOSIS — E039 Hypothyroidism, unspecified: Secondary | ICD-10-CM | POA: Diagnosis not present

## 2021-02-11 DIAGNOSIS — I482 Chronic atrial fibrillation, unspecified: Secondary | ICD-10-CM | POA: Diagnosis not present

## 2021-02-11 DIAGNOSIS — I2510 Atherosclerotic heart disease of native coronary artery without angina pectoris: Secondary | ICD-10-CM | POA: Diagnosis not present

## 2021-02-13 DIAGNOSIS — I10 Essential (primary) hypertension: Secondary | ICD-10-CM | POA: Diagnosis not present

## 2021-02-13 DIAGNOSIS — I482 Chronic atrial fibrillation, unspecified: Secondary | ICD-10-CM | POA: Diagnosis not present

## 2021-02-13 DIAGNOSIS — F5104 Psychophysiologic insomnia: Secondary | ICD-10-CM | POA: Diagnosis not present

## 2021-02-13 DIAGNOSIS — E039 Hypothyroidism, unspecified: Secondary | ICD-10-CM | POA: Diagnosis not present

## 2021-02-13 DIAGNOSIS — K219 Gastro-esophageal reflux disease without esophagitis: Secondary | ICD-10-CM | POA: Diagnosis not present

## 2021-02-13 DIAGNOSIS — I2510 Atherosclerotic heart disease of native coronary artery without angina pectoris: Secondary | ICD-10-CM | POA: Diagnosis not present

## 2021-02-15 ENCOUNTER — Ambulatory Visit: Payer: Medicare Other | Admitting: Cardiology

## 2021-02-19 ENCOUNTER — Encounter: Payer: Self-pay | Admitting: Cardiology

## 2021-02-19 ENCOUNTER — Ambulatory Visit (INDEPENDENT_AMBULATORY_CARE_PROVIDER_SITE_OTHER): Payer: Medicare Other | Admitting: Cardiology

## 2021-02-19 ENCOUNTER — Other Ambulatory Visit: Payer: Self-pay

## 2021-02-19 VITALS — BP 110/70 | HR 89 | Ht 62.0 in | Wt 160.9 lb

## 2021-02-19 DIAGNOSIS — I4821 Permanent atrial fibrillation: Secondary | ICD-10-CM | POA: Diagnosis not present

## 2021-02-19 NOTE — Progress Notes (Signed)
Electrophysiology Office Note   Date:  02/19/2021   ID:  Chelsea Hancock, DOB [birth year], MRN [id number]  PCP:  Lajean Manes, MD  Cardiologist:  [name] Primary Electrophysiologist:  Constance Haw, MD    No chief complaint on file.    History of Present Illness: Chelsea Hancock is a 87 y.o. female who presents today for electrophysiology evaluation.     She has a history significant for atrial fibrillation and atrial flutter.  She is status post ablation x2, most recently 08/15/2016.  She continued to have episodes of atrial fibrillation and was started on amiodarone.  She is status post Medtronic dual-chamber pacemaker 09/19/2020 for tachybradycardia syndrome.  Unfortunately her atrial fibrillation became permanent and at times quite rapid.  She is now status post AV node ablation 01/17/2021.  Today, denies symptoms of palpitations, chest pain, shortness of breath, orthopnea, PND, lower extremity edema, claudication, dizziness, presyncope, syncope, bleeding, or neurologic sequela. The patient is tolerating medications without difficulties.  Since her AV node ablation she has done well.  She has no chest pain or shortness of breath.  Her daughter states that she has more energy and is able to do more daily activities.  She also has less shortness of breath.  She has moved to [place] and plans to establish with cardiology in that area.  Past Medical History:  Diagnosis Date  . Atrial fibrillation and flutter ([place])    a. s/p PVI ablation in 07/2015 with continue PAF  b. s/p repeat ablation on 08/15/16. continued on Flecainide 100mg  BID  . Carotid bruit    Right  . Chronic back pain   . Depression with anxiety 03/29/2013  . Diverticulosis   . Fibromyalgia   . Foot drop, right   . GERD (gastroesophageal reflux disease)   . Hemorrhoids   . HTN (hypertension)   . Hyperlipidemia   . IBS (irritable bowel syndrome)   . Memory loss 02/11/2015  .  Onychomycosis 07/03/2013  . Osteoporosis   . Sigmoid colon ulcer 03/23/2012   Past Surgical History:  Procedure Laterality Date  . ABDOMINAL HYSTERECTOMY  1981  . AV NODE ABLATION N/A 01/17/2021   Procedure: AV NODE ABLATION;  Surgeon: Constance Haw, MD;  Location: [facility];  Service: Cardiovascular;  Laterality: N/A;  . BACK SURGERY  1997   reconstructive lower back surgery  . CARDIOVERSION N/A 07/01/2016   Procedure: CARDIOVERSION;  Surgeon: Thayer Headings, MD;  Location: [facility];  Service: Cardiovascular;  Laterality: N/A;  . CARDIOVERSION N/A 10/22/2017   Procedure: CARDIOVERSION;  Surgeon: Pixie Casino, MD;  Location: [facility] ENDOSCOPY;  Service: Cardiovascular;  Laterality: N/A;  . CARDIOVERSION N/A 07/19/2020   Procedure: CARDIOVERSION;  Surgeon: Freada Bergeron, MD;  Location: [facility] ENDOSCOPY;  Service: Cardiovascular;  Laterality: N/A;  . CARDIOVERSION N/A 10/23/2020   Procedure: CARDIOVERSION;  Surgeon: Buford Dresser, MD;  Location: [facility] ENDOSCOPY;  Service: Cardiovascular;  Laterality: N/A;  . CARPAL TUNNEL RELEASE Left 2012   ulnar nerve release at elbow  . CERVICAL FUSION  2003, 2005, 2013  . COLONOSCOPY    . ELECTROPHYSIOLOGIC STUDY N/A 08/10/2015   Procedure: Afib;  Surgeon: Elodie Panameno Meredith Leeds, MD;  Location: [facility];  Service: Cardiovascular;  Laterality: N/A;  . ELECTROPHYSIOLOGIC STUDY N/A 08/15/2016   Procedure: Atrial Fibrillation Ablation;  Surgeon: Pearly Apachito Meredith Leeds, MD;  Location: [facility];  Service: Cardiovascular;  Laterality: N/A;  . EYE SURGERY  2015  b/l cataracts removed  . left elbow surgery  06/03/11   cyst removed  . LUMBAR LAMINECTOMY  1960   x 2  . PACEMAKER IMPLANT N/A 09/19/2020   Procedure: PACEMAKER IMPLANT;  Surgeon: Constance Haw, MD;  Location: [facility];  Service: Cardiovascular;  Laterality: N/A;  . POSTERIOR CERVICAL FUSION/FORAMINOTOMY  08/03/2012   Procedure: POSTERIOR  CERVICAL FUSION/FORAMINOTOMY LEVEL 5;  Surgeon: [name], MD;  Location: [facility];  Service: Neurosurgery;  Laterality: N/A;  Cervical four to Thoracic two Posterior cervical decompression with Facet and Pedicle screw fixation  . TEE WITHOUT CARDIOVERSION N/A 10/22/2017   Procedure: TRANSESOPHAGEAL ECHOCARDIOGRAM (TEE);  Surgeon: Pixie Casino, MD;  Location: [facility] ENDOSCOPY;  Service: Cardiovascular;  Laterality: N/A;  . TONSILLECTOMY  1943  . TOTAL HIP ARTHROPLASTY Left 2012  . TOTAL KNEE ARTHROPLASTY Right 11/20/2014   Procedure: RIGHT TOTAL KNEE ARTHROPLASTY;  Surgeon: [name], MD;  Location: [facility];  Service: Orthopedics;  Laterality: Right;  . veins stripped  1970  . WRIST SURGERY Right 1990's   cyst removed     Current Outpatient Medications  Medication Sig Dispense Refill  . acetaminophen (TYLENOL) 500 MG tablet Take 1,000 mg by mouth 3 (three) times daily.    Marland Kitchen atorvastatin (LIPITOR) 10 MG tablet Take 10 mg by mouth daily.    . Calcium Carb-Cholecalciferol 600-800 MG-UNIT TABS Take 1 tablet by mouth at bedtime.    Marland Kitchen denosumab (PROLIA) 60 MG/ML SOSY injection Inject 60 mg into the skin every 6 (six) months.    Marland Kitchen ELIQUIS 5 MG TABS tablet Take 1 tablet by mouth twice daily. (Patient taking differently: Take 5 mg by mouth 2 (two) times daily.) 60 tablet 3  . esomeprazole (NEXIUM) 40 MG capsule TAKE ONE CAPSULE BY MOUTH EVERY DAY AT 12 NOON (Patient taking differently: Take 40 mg by mouth daily.) 90 capsule 2  . ezetimibe (ZETIA) 10 MG tablet TAKE 10 MG BY MOUTH AT BEDTIME (Patient taking differently: Take 10 mg by mouth at bedtime.) 30 tablet 6  . ferrous sulfate 325 (65 FE) MG tablet Take 325 mg by mouth daily with breakfast.    . gabapentin (NEURONTIN) 600 MG tablet TAKE 1 TABLET(600 MG) BY MOUTH TWICE DAILY (Patient taking differently: Take 600 mg by mouth 2 (two) times daily.) 180 tablet 0  . levothyroxine (SYNTHROID) 25 MCG tablet Take 25 mcg by mouth daily before  breakfast.    . Melatonin 10 MG TABS Take 10 mg by mouth at bedtime.    . Multiple Vitamin (MULTIVITAMIN WITH MINERALS) TABS tablet Take 1 tablet by mouth daily. Centrum Silver for Women    . nortriptyline (PAMELOR) 50 MG capsule Take 100 mg by mouth at bedtime.    . ondansetron (ZOFRAN-ODT) 4 MG disintegrating tablet Take 4 mg by mouth every 8 (eight) hours as needed for nausea or vomiting.    . sertraline (ZOLOFT) 50 MG tablet Take 50 mg by mouth daily.     No current facility-administered medications for this visit.    Allergies:   Cefuroxime axetil, Macrodantin, Alendronate sodium, Ciprofibrate, Codeine, Demerol [meperidine], Other, Penicillins, Pravastatin, and Sulfonamide derivatives   Social History:  The patient  reports that she has quit smoking. Her smoking use included cigarettes. She has a 1.00 pack-year smoking history. She has never used smokeless tobacco. She reports current alcohol use. She reports that she does not use drugs.   Family History:  The patient's family history includes Anxiety disorder in  her mother; Bipolar disorder in her mother; Colon cancer in her father; HIV/AIDS in her son; Healthy in her daughter; Heart disease in her maternal grandmother; Mental illness in her mother; Stomach cancer in her maternal grandfather, mother, and paternal grandmother; Stroke in her father; Throat cancer in her maternal uncle.   ROS:  Please see the history of present illness.   Otherwise, review of systems is positive for none.   All other systems are reviewed and negative.   PHYSICAL EXAM: VS:  BP 110/70   Pulse 89   Ht 5\' 2"  (1.575 m)   Wt 160 lb 13.9 oz (73 kg)   SpO2 95%   BMI 29.42 kg/m  , BMI Body mass index is 29.42 kg/m. GEN: Well nourished, well developed, in no acute distress  HEENT: normal  Neck: no JVD, carotid bruits, or masses Cardiac: RRR; no murmurs, rubs, or gallops,no edema  Respiratory:  clear to auscultation bilaterally, normal work of breathing GI:  soft, nontender, nondistended, + BS MS: no deformity or atrophy  Skin: warm and dry, device site well healed Neuro:  Strength and sensation are intact Psych: euthymic mood, full affect  EKG:  EKG is ordered today. Personal review of the ekg ordered shows atrial fibrillation, ventricular paced, rate 89  Personal review of the device interrogation today. Results in [place]: 07/04/2020: ALT 27 12/27/2020: BUN 21; Creatinine, Ser 1.01; Hemoglobin 12.7; Platelets 281; Potassium 4.6; Sodium 143    Lipid Panel     Component Value Date/Time   CHOL 161 04/16/2019 0352   TRIG 120 04/16/2019 0352   TRIG 133 06/30/2006 1110   HDL 53 04/16/2019 0352   CHOLHDL 3.0 04/16/2019 0352   VLDL 24 04/16/2019 0352   LDLCALC 84 04/16/2019 0352   LDLDIRECT 93.3 08/04/2008 0915     Wt Readings from Last 3 Encounters:  02/19/21 160 lb 13.9 oz (73 kg)  01/17/21 155 lb (70.3 kg)  12/27/20 156 lb 6.4 oz (70.9 kg)      Other studies Reviewed: Additional studies/ records that were reviewed today include: TEE 10/22/17  Review of the above records today demonstrates:  - Left ventricle: There was mild concentric hypertrophy. Systolic   function was mildly reduced. The estimated ejection fraction was   in the range of 45% to 50%. Diffuse hypokinesis. No evidence of   thrombus. - Aortic valve: No evidence of vegetation. - Mitral valve: There was mild regurgitation. - Left atrium: No evidence of thrombus in the atrial cavity or   appendage. No evidence of thrombus in the atrial cavity or   appendage. - Right atrium: No evidence of thrombus in the atrial cavity or   appendage. 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 This was visualized in 2D and 3D modes. - Tricuspid  valve: There was mild regurgitation. - Pulmonic valve: There was trivial regurgitation.   ASSESSMENT AND PLAN:  1.  Persistent atrial fibrillation: Status post ablation 10/16/2015.  Currently on Eliquis.  Unfortunately has gone back into atrial fibrillation and has failed multiple antiarrhythmics.  She is status post AV node ablation on 01/17/2021.  She remains in sinus rhythm.  Have turned her rate down to VVIR 80.  This can be further adjusted by  her cardiologist in [place].  2.  Hypertension: Currently well controlled  3.  Hyperlipidemia: Continue atorvastatin and Zetia  4.  Tachybradycardia syndrome: Status post Medtronic dual-chamber pacemaker implanted 09/19/2020.  Device functioning appropriately.  No changes at this time.  Current medicines are reviewed at length with the patient today.   The patient does not have concerns regarding her medicines.  The following changes were made today: None  Labs/ tests ordered today include:  Orders Placed This Encounter  Procedures  . EKG 12-Lead     Disposition:   FU with Amaurie Wandel as needed months  Signed, Melea Prezioso Meredith Leeds, MD  02/19/2021 2:24 PM     [facility] [address] [place] [address] [phone] (office) [phone] (fax)

## 2021-02-20 DIAGNOSIS — G3184 Mild cognitive impairment, so stated: Secondary | ICD-10-CM | POA: Diagnosis not present

## 2021-02-20 DIAGNOSIS — F331 Major depressive disorder, recurrent, moderate: Secondary | ICD-10-CM | POA: Diagnosis not present

## 2021-02-20 DIAGNOSIS — F5101 Primary insomnia: Secondary | ICD-10-CM | POA: Diagnosis not present

## 2021-03-04 DIAGNOSIS — R9431 Abnormal electrocardiogram [ECG] [EKG]: Secondary | ICD-10-CM | POA: Diagnosis not present

## 2021-03-04 DIAGNOSIS — I081 Rheumatic disorders of both mitral and tricuspid valves: Secondary | ICD-10-CM | POA: Diagnosis not present

## 2021-03-29 ENCOUNTER — Other Ambulatory Visit: Payer: Self-pay | Admitting: Cardiology

## 2021-09-01 IMAGING — CT CT RENAL STONE PROTOCOL
2 of 4 series · 16 of 46 positions shown, 18 images · non-contrast
Comparison: CT abdomen pelvis 07/19/2015

CLINICAL DATA: Left flank pain.  Back pain

EXAM:
CT ABDOMEN AND PELVIS WITHOUT CONTRAST
TECHNIQUE: Multidetector CT imaging of the abdomen and pelvis was performed
following the standard protocol without IV contrast.

[Series 3: ap without · axial · non-contrast · 0.64mm/px · z∈[-476,-61]mm · 13 of 93 slices shown, 15 images]
[im 5/93  soft-tissue]
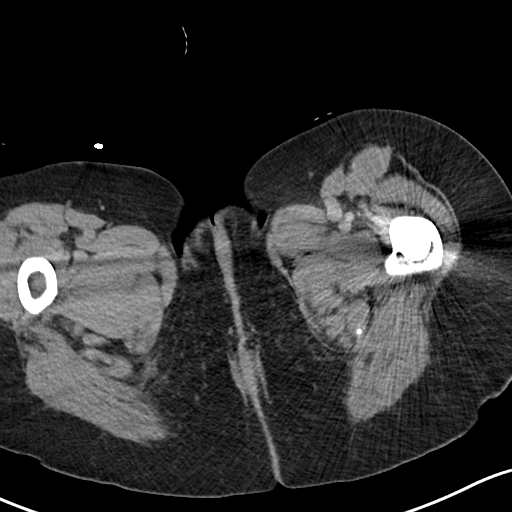
[im 5/93  bone]
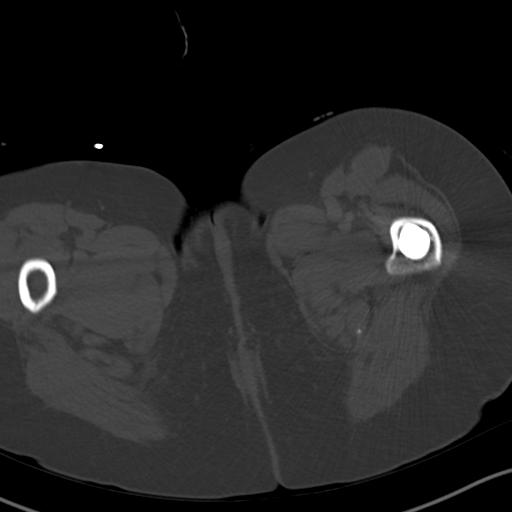
[im 15/93  soft-tissue]
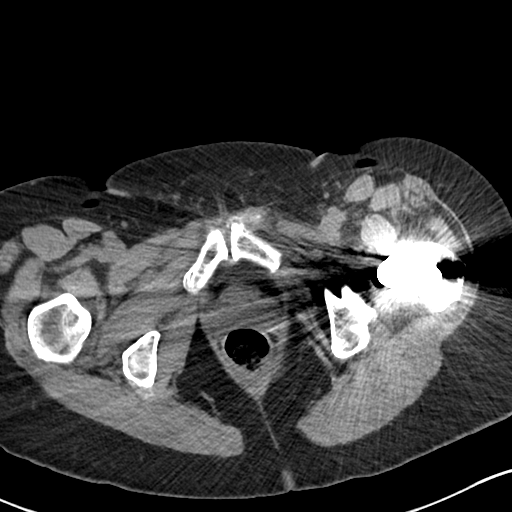
[im 20/93  soft-tissue]
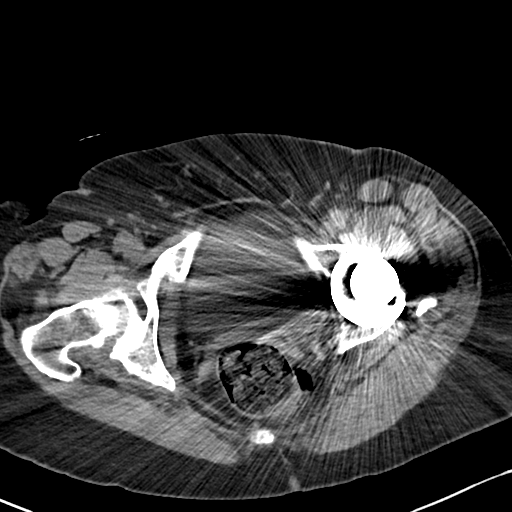
[im 25/93  soft-tissue]
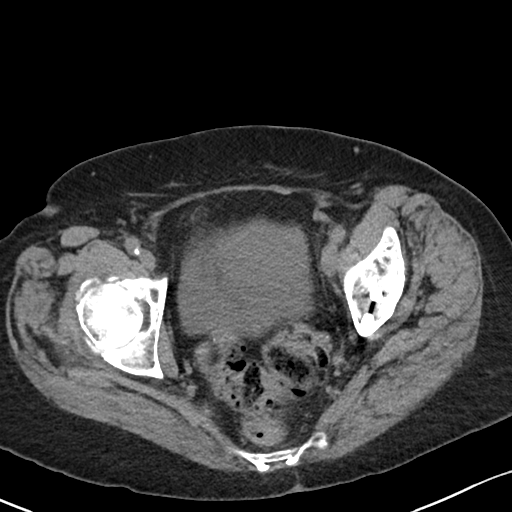
[im 34/93  soft-tissue]
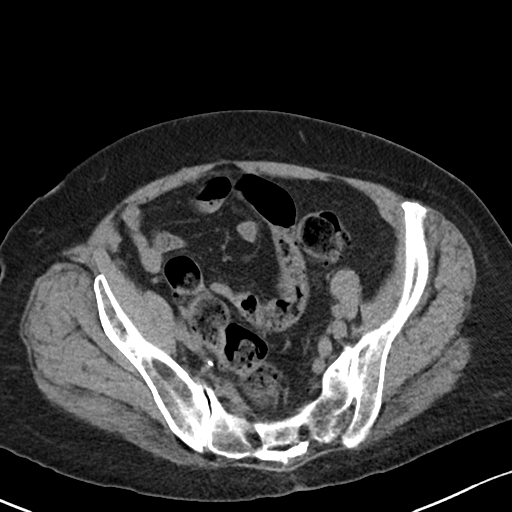
[im 39/93  soft-tissue]
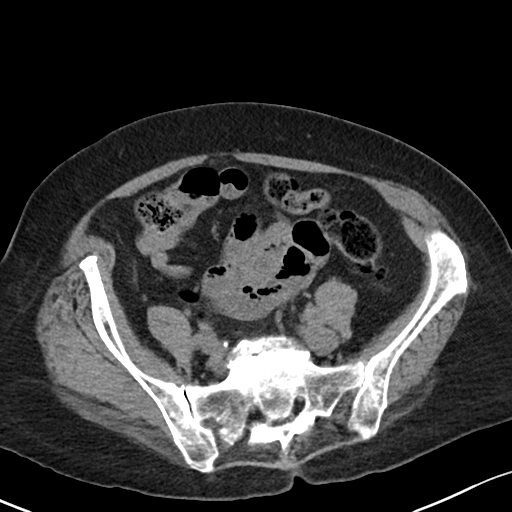
[im 49/93  soft-tissue]
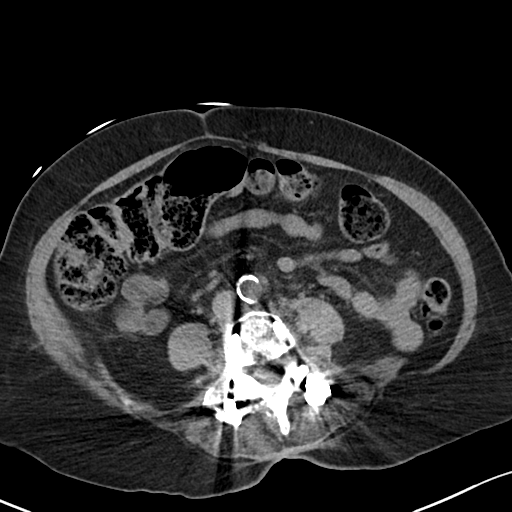
[im 54/93  soft-tissue]
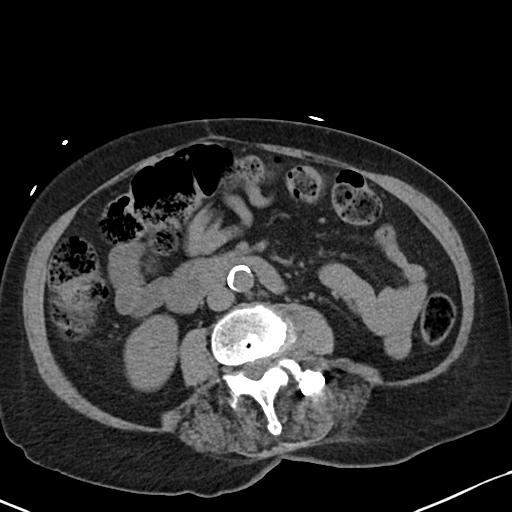
[im 59/93  soft-tissue]
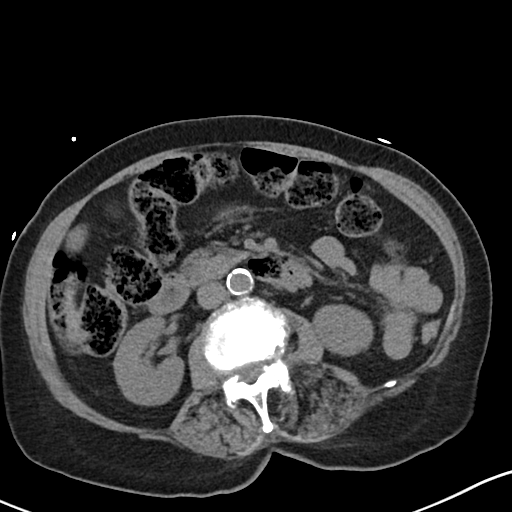
[im 59/93  bone]
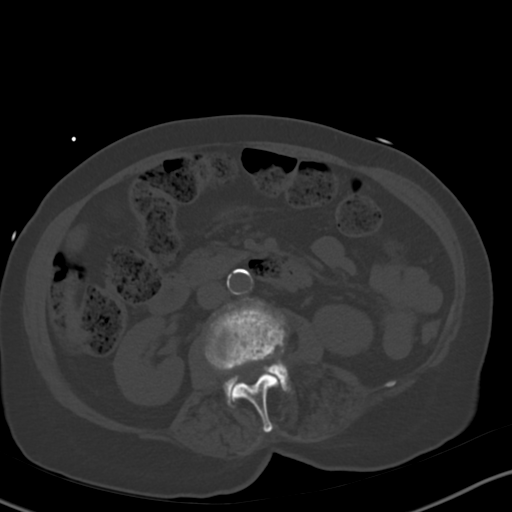
[im 68/93  soft-tissue]
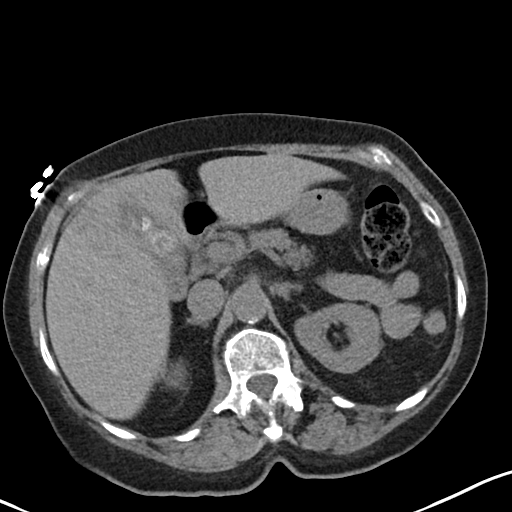
[im 73/93  soft-tissue]
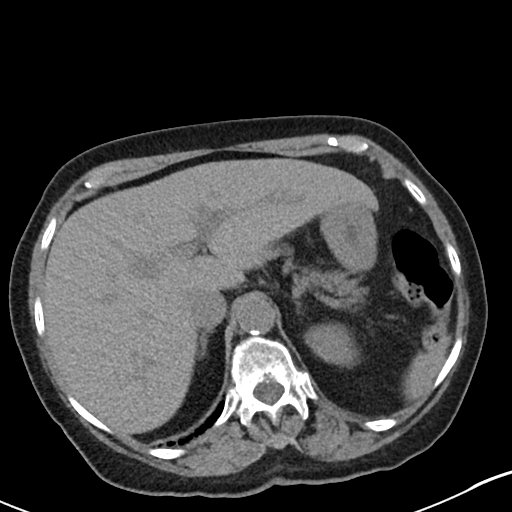
[im 78/93  soft-tissue]
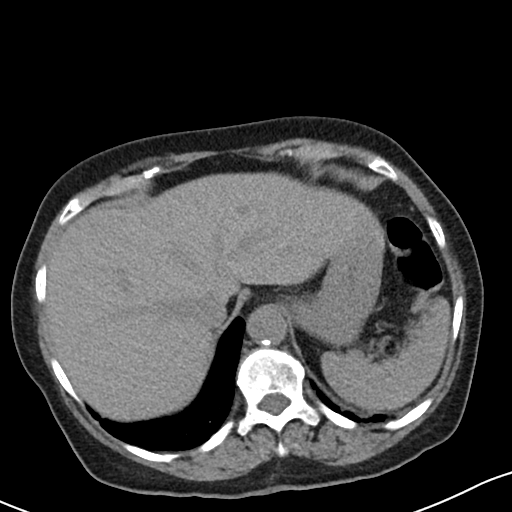
[im 88/93  soft-tissue]
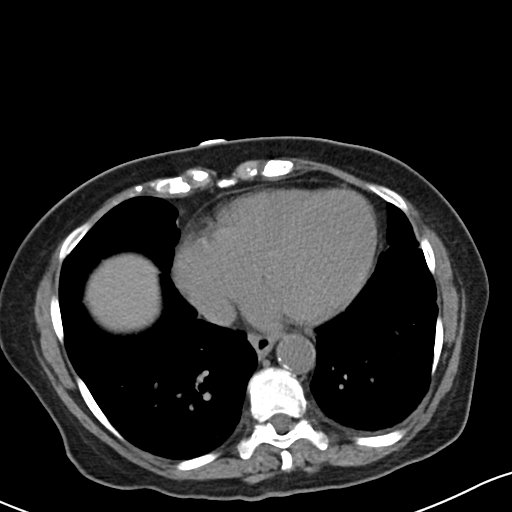

[Series 6: cor · coronal · 0.66mm/px · 3 of 87 slices shown]
[im 29/87  soft-tissue]
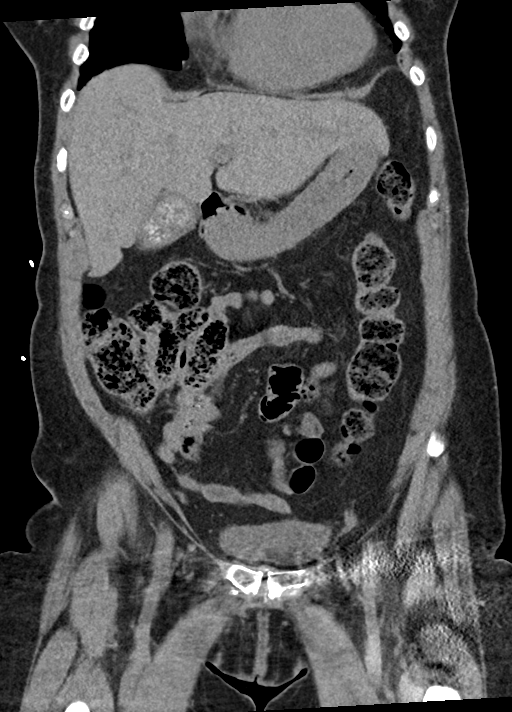
[im 39/87  soft-tissue]
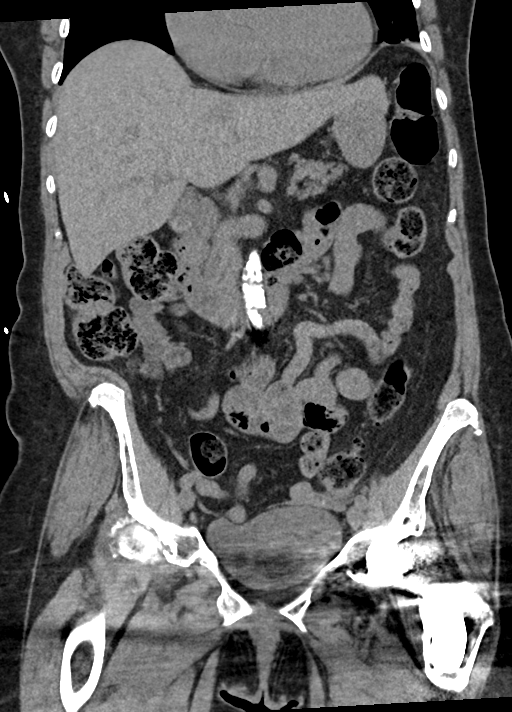
[im 48/87  soft-tissue]
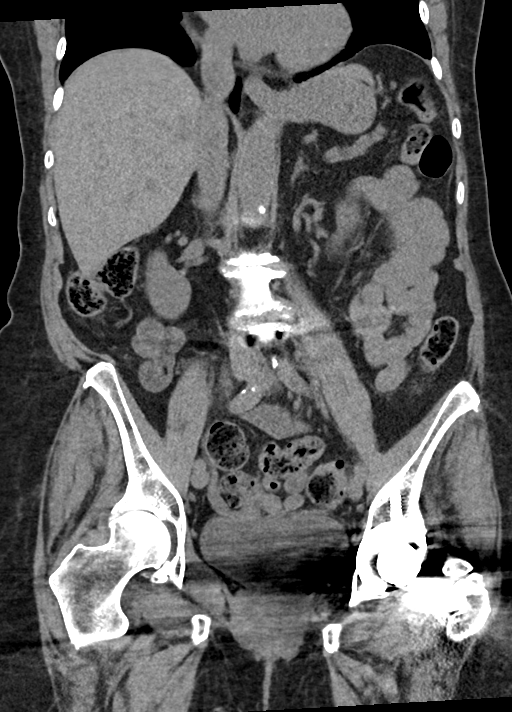

[16 of 46 positions shown; findings below may reference images not displayed]

FINDINGS: Lower chest: Mild scarring in the left lung base. No infiltrate or
effusion. Heart size within normal limits.

Hepatobiliary: Gallbladder is filled with gallstones. No gallbladder
wall thickening or biliary dilatation. No focal liver lesion.

Pancreas: Negative

Spleen: Negative

Adrenals/Urinary Tract: Adrenal glands are unremarkable. Kidneys are
normal, without renal calculi, focal lesion, or hydronephrosis.
Bladder is unremarkable.

Stomach/Bowel: Stomach is within normal limits. Appendix
nonvisualized. No evidence of bowel wall thickening, distention, or
inflammatory changes. Mild sigmoid diverticulosis without
diverticulitis.

Vascular/Lymphatic: Atherosclerotic calcification abdominal aorta
without aneurysm. No enlarged lymph nodes.

Reproductive: Hysterectomy.  No pelvic mass.

Other: Negative for free fluid.  Negative for hernia.

Musculoskeletal: Lumbar scoliosis and multilevel degenerative
change. Left hip replacement. Moderate degenerative change right
hip.
IMPRESSION: 1. Negative for urinary tract calculi
2. Cholelithiasis without cholecystitis or biliary dilatation
3. Sigmoid diverticulosis without diverticulitis
4. Atherosclerotic aorta without aneurysm.

## 2022-10-30 ENCOUNTER — Encounter (HOSPITAL_COMMUNITY): Payer: Self-pay | Admitting: *Deleted
